# Patient Record
Sex: Male | Born: 1950
Health system: Southern US, Community
[De-identification: ages and names within clinical notes are randomized; demographics above are authoritative.]

## PROBLEM LIST (undated history)

## (undated) DIAGNOSIS — J45909 Unspecified asthma, uncomplicated: Secondary | ICD-10-CM

## (undated) DIAGNOSIS — F32A Depression, unspecified: Secondary | ICD-10-CM

## (undated) DIAGNOSIS — I219 Acute myocardial infarction, unspecified: Secondary | ICD-10-CM

## (undated) DIAGNOSIS — E669 Obesity, unspecified: Secondary | ICD-10-CM

## (undated) DIAGNOSIS — D631 Anemia in chronic kidney disease: Secondary | ICD-10-CM

## (undated) DIAGNOSIS — N189 Chronic kidney disease, unspecified: Secondary | ICD-10-CM

## (undated) DIAGNOSIS — M79606 Pain in leg, unspecified: Secondary | ICD-10-CM

## (undated) DIAGNOSIS — K5900 Constipation, unspecified: Secondary | ICD-10-CM

## (undated) DIAGNOSIS — M7989 Other specified soft tissue disorders: Secondary | ICD-10-CM

## (undated) DIAGNOSIS — R0602 Shortness of breath: Secondary | ICD-10-CM

## (undated) DIAGNOSIS — I10 Essential (primary) hypertension: Secondary | ICD-10-CM

## (undated) DIAGNOSIS — E785 Hyperlipidemia, unspecified: Secondary | ICD-10-CM

## (undated) DIAGNOSIS — U071 COVID-19: Secondary | ICD-10-CM

## (undated) DIAGNOSIS — Z86718 Personal history of other venous thrombosis and embolism: Secondary | ICD-10-CM

## (undated) DIAGNOSIS — E119 Type 2 diabetes mellitus without complications: Secondary | ICD-10-CM

## (undated) DIAGNOSIS — G4730 Sleep apnea, unspecified: Secondary | ICD-10-CM

## (undated) DIAGNOSIS — I509 Heart failure, unspecified: Secondary | ICD-10-CM

## (undated) DIAGNOSIS — I2699 Other pulmonary embolism without acute cor pulmonale: Secondary | ICD-10-CM

## (undated) DIAGNOSIS — I2510 Atherosclerotic heart disease of native coronary artery without angina pectoris: Secondary | ICD-10-CM

## (undated) DIAGNOSIS — J189 Pneumonia, unspecified organism: Secondary | ICD-10-CM

## (undated) DIAGNOSIS — I82409 Acute embolism and thrombosis of unspecified deep veins of unspecified lower extremity: Secondary | ICD-10-CM

## (undated) HISTORY — PX: TONSILLECTOMY: SUR1361

## (undated) HISTORY — DX: Other specified soft tissue disorders: M79.89

## (undated) HISTORY — DX: Anemia in chronic kidney disease: D63.1

## (undated) HISTORY — PX: COLONOSCOPY: SHX174

## (undated) HISTORY — DX: Chronic kidney disease, unspecified: N18.9

## (undated) HISTORY — DX: Essential (primary) hypertension: I10

## (undated) HISTORY — DX: Heart failure, unspecified: I50.9

## (undated) HISTORY — PX: HAND SURGERY: SHX662

## (undated) HISTORY — DX: Constipation, unspecified: K59.00

## (undated) HISTORY — DX: Type 2 diabetes mellitus without complications: E11.9

## (undated) HISTORY — DX: Obesity, unspecified: E66.9

## (undated) HISTORY — DX: Hyperlipidemia, unspecified: E78.5

## (undated) HISTORY — DX: Personal history of other venous thrombosis and embolism: Z86.718

## (undated) HISTORY — DX: Shortness of breath: R06.02

## (undated) HISTORY — DX: Sleep apnea, unspecified: G47.30

## (undated) HISTORY — DX: Pain in leg, unspecified: M79.606

## (undated) HISTORY — DX: Other pulmonary embolism without acute cor pulmonale: I26.99

---

## 1989-02-11 HISTORY — PX: HAND SURGERY: SHX662

## 2012-03-11 ENCOUNTER — Other Ambulatory Visit: Payer: Self-pay | Admitting: Orthopedic Surgery

## 2012-03-11 DIAGNOSIS — R531 Weakness: Secondary | ICD-10-CM

## 2012-03-11 DIAGNOSIS — M25562 Pain in left knee: Secondary | ICD-10-CM

## 2012-03-11 DIAGNOSIS — R609 Edema, unspecified: Secondary | ICD-10-CM

## 2013-03-01 ENCOUNTER — Encounter: Payer: Self-pay | Admitting: Gastroenterology

## 2013-03-17 ENCOUNTER — Other Ambulatory Visit: Payer: Self-pay | Admitting: Internal Medicine

## 2013-03-17 DIAGNOSIS — R799 Abnormal finding of blood chemistry, unspecified: Secondary | ICD-10-CM

## 2013-03-29 ENCOUNTER — Ambulatory Visit
Admission: RE | Admit: 2013-03-29 | Discharge: 2013-03-29 | Disposition: A | Discharge status: Home / Self Care | Payer: BC Managed Care – PPO | Source: Ambulatory Visit | Attending: Internal Medicine | Admitting: Internal Medicine

## 2013-03-29 DIAGNOSIS — R799 Abnormal finding of blood chemistry, unspecified: Secondary | ICD-10-CM

## 2013-04-05 ENCOUNTER — Other Ambulatory Visit: Payer: Self-pay

## 2013-08-26 ENCOUNTER — Encounter: Payer: Self-pay | Admitting: Gastroenterology

## 2013-10-14 ENCOUNTER — Encounter: Payer: BC Managed Care – PPO | Attending: Endocrinology

## 2013-10-14 VITALS — Ht 74.0 in | Wt 323.6 lb

## 2013-10-14 DIAGNOSIS — Z713 Dietary counseling and surveillance: Secondary | ICD-10-CM | POA: Insufficient documentation

## 2013-10-14 DIAGNOSIS — E119 Type 2 diabetes mellitus without complications: Secondary | ICD-10-CM | POA: Diagnosis not present

## 2013-10-14 NOTE — Progress Notes (Signed)
Patient was seen on 10-14-13 for the first of a series of three diabetes self-management courses at the [facility].  Patient Education Plan per assessed needs and concerns is to attend four course education program for Diabetes Self Management Education.  Current HbA1c: 11.7%  The following learning objectives were met by the patient during this class:  Describe diabetes  State some common risk factors for diabetes  Defines the role of glucose and insulin  Identifies type of diabetes and pathophysiology  Describe the relationship between diabetes and cardiovascular risk  State the members of the Healthcare Team  States the rationale for glucose monitoring  State when to test glucose  State their individual Target Range  State the importance of logging glucose readings  Describe how to interpret glucose readings  Identifies A1C target  Explain the correlation between A1c and eAG values  State symptoms and treatment of high blood glucose  State symptoms and treatment of low blood glucose  Explain proper technique for glucose testing  Identifies proper sharps disposal  Handouts given during class include:  Living Well with Diabetes book  Carb Counting and Meal Planning book  Meal Plan Card  Carbohydrate guide  Meal planning worksheet  Low Sodium Flavoring Tips  The diabetes portion plate  T9G to eAG Conversion Chart  Diabetes Medications  Diabetes Recommended Care Schedule  Support Group  Diabetes Success Plan  Core Class Satisfaction Survey  Follow-Up Plan:  Attend core 2

## 2013-10-21 DIAGNOSIS — E119 Type 2 diabetes mellitus without complications: Secondary | ICD-10-CM

## 2013-10-21 NOTE — Progress Notes (Signed)

## 2013-10-28 DIAGNOSIS — E119 Type 2 diabetes mellitus without complications: Secondary | ICD-10-CM

## 2013-11-01 NOTE — Progress Notes (Signed)
Patient was seen on 10/28/13 for the third of a series of three diabetes self-management courses at the [facility]. The following learning objectives were met by the patient during this class:    State the amount of activity recommended for healthy living   Describe activities suitable for individual needs   Identify ways to regularly incorporate activity into daily life   Identify barriers to activity and ways to over come these barriers  Identify diabetes medications being personally used and their primary action for lowering glucose and possible side effects   Describe role of stress on blood glucose and develop strategies to address psychosocial issues   Identify diabetes complications and ways to prevent them  Explain how to manage diabetes during illness   Evaluate success in meeting personal goal   Establish 2-3 goals that they will plan to diligently work on until they return for the  45-month follow-up visit  Goals:  No goals set  No obstacles noted in achieving success   Plan:  Attend Core 4 in 4 months

## 2014-02-14 ENCOUNTER — Encounter: Payer: BC Managed Care – PPO | Attending: Endocrinology

## 2014-02-14 DIAGNOSIS — E119 Type 2 diabetes mellitus without complications: Secondary | ICD-10-CM | POA: Insufficient documentation

## 2014-02-14 DIAGNOSIS — Z713 Dietary counseling and surveillance: Secondary | ICD-10-CM | POA: Insufficient documentation

## 2014-02-14 NOTE — Progress Notes (Signed)
Appt start time: 0900 end time:  1000.  Patient was seen on 02/14/14 for a review of the series of three diabetes self-management courses at the [facility]. The following learning objectives were met by the patient during this class:  . Reviewed blood glucose monitoring and interpretation including the recommended target ranges and Hgb A1c.  . Reviewed on carb counting, importance of regularly scheduled meals/snacks, and meal planning.  . Reviewed the effects of physical activity on glucose levels and long-term glucose control.  Recommended goal of 150 minutes of physical activity/week. . Reviewed patient medications and discussed role of medication on blood glucose and possible side effects. . Discussed strategies to manage stress, psychosocial issues, and other obstacles to diabetes management. . Encouraged moderate weight reduction to improve glucose levels.   . Reviewed short-term complications: hyper- and hypo-glycemia.  Discussed causes, symptoms, and treatment options. . Reviewed prevention, detection, and treatment of long-term complications.  Discussed the role of prolonged elevated glucose levels on body systems.  Goals:  Follow Diabetes Meal Plan as instructed  Eat 3 meals and 2 snacks, every 3-5 hrs  Limit carbohydrate intake to 45 grams carbohydrate/meal Limit carbohydrate intake to 15 grams carbohydrate/snack Add lean protein foods to meals/snacks  Monitor glucose levels as instructed by your doctor  Aim for goal of 15-30 mins of physical activity daily as tolerated  Bring food record and glucose log to your next nutrition visit

## 2016-02-12 DIAGNOSIS — J189 Pneumonia, unspecified organism: Secondary | ICD-10-CM

## 2016-02-12 HISTORY — DX: Pneumonia, unspecified organism: J18.9

## 2016-09-04 ENCOUNTER — Other Ambulatory Visit: Payer: Self-pay | Admitting: *Deleted

## 2016-09-04 NOTE — Patient Outreach (Signed)
[facility]) Care Management  09/04/2016  Aston Lieske [facility] 07-20-1950 [id number]   Member identified as high risk according to [employer] health questionnaire.  Call placed to introduce [facility] care management services and perform telephone screening.  No answer, HIPAA compliant voice message left.  Will await call back, if no call back, will follow up within the next week.  Call received back from member.  Identity confirmed.  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  He confirms that he does visit his primary MD, Dr. Wilson Singer, on a regular basis.  He denies any visits to the ED or admits to the hospital.  He has history of hypertension and diabetes, but report that they are both well controlled.  He denies taking any medications at this time due to conditions being controlled.  He is independent with ADLs/IADLs, denies the need for assistance in the home.  He denies the need for [facility] care management services at this time, is receptive to receiving information in the event there is a need in the future.  Will send [facility] information packet, will not open case/place referral.   Valente David, RN, MSN [place] [phone]

## 2016-09-10 ENCOUNTER — Encounter: Payer: Self-pay | Admitting: *Deleted

## 2016-10-08 DIAGNOSIS — I10 Essential (primary) hypertension: Secondary | ICD-10-CM | POA: Diagnosis not present

## 2016-10-08 DIAGNOSIS — Z0000 Encounter for general adult medical examination without abnormal findings: Secondary | ICD-10-CM | POA: Diagnosis not present

## 2016-10-08 DIAGNOSIS — Z23 Encounter for immunization: Secondary | ICD-10-CM | POA: Diagnosis not present

## 2016-10-08 DIAGNOSIS — E119 Type 2 diabetes mellitus without complications: Secondary | ICD-10-CM | POA: Diagnosis not present

## 2016-11-04 DIAGNOSIS — I10 Essential (primary) hypertension: Secondary | ICD-10-CM | POA: Diagnosis not present

## 2016-11-04 DIAGNOSIS — E1165 Type 2 diabetes mellitus with hyperglycemia: Secondary | ICD-10-CM | POA: Diagnosis not present

## 2016-11-04 DIAGNOSIS — Z0000 Encounter for general adult medical examination without abnormal findings: Secondary | ICD-10-CM | POA: Diagnosis not present

## 2016-11-04 DIAGNOSIS — E789 Disorder of lipoprotein metabolism, unspecified: Secondary | ICD-10-CM | POA: Diagnosis not present

## 2016-11-04 DIAGNOSIS — Z7289 Other problems related to lifestyle: Secondary | ICD-10-CM | POA: Diagnosis not present

## 2016-11-04 DIAGNOSIS — Z125 Encounter for screening for malignant neoplasm of prostate: Secondary | ICD-10-CM | POA: Diagnosis not present

## 2016-11-06 DIAGNOSIS — Z23 Encounter for immunization: Secondary | ICD-10-CM | POA: Diagnosis not present

## 2016-11-06 DIAGNOSIS — E7800 Pure hypercholesterolemia, unspecified: Secondary | ICD-10-CM | POA: Diagnosis not present

## 2016-11-06 DIAGNOSIS — E1165 Type 2 diabetes mellitus with hyperglycemia: Secondary | ICD-10-CM | POA: Diagnosis not present

## 2016-11-06 DIAGNOSIS — R809 Proteinuria, unspecified: Secondary | ICD-10-CM | POA: Diagnosis not present

## 2016-11-06 DIAGNOSIS — G4733 Obstructive sleep apnea (adult) (pediatric): Secondary | ICD-10-CM | POA: Diagnosis not present

## 2016-11-06 DIAGNOSIS — M5432 Sciatica, left side: Secondary | ICD-10-CM | POA: Diagnosis not present

## 2016-11-06 DIAGNOSIS — Z0001 Encounter for general adult medical examination with abnormal findings: Secondary | ICD-10-CM | POA: Diagnosis not present

## 2016-11-06 DIAGNOSIS — J4521 Mild intermittent asthma with (acute) exacerbation: Secondary | ICD-10-CM | POA: Diagnosis not present

## 2016-11-06 DIAGNOSIS — I10 Essential (primary) hypertension: Secondary | ICD-10-CM | POA: Diagnosis not present

## 2016-11-06 DIAGNOSIS — E789 Disorder of lipoprotein metabolism, unspecified: Secondary | ICD-10-CM | POA: Diagnosis not present

## 2016-11-06 DIAGNOSIS — J90 Pleural effusion, not elsewhere classified: Secondary | ICD-10-CM | POA: Diagnosis not present

## 2016-11-06 DIAGNOSIS — R0602 Shortness of breath: Secondary | ICD-10-CM | POA: Diagnosis not present

## 2016-11-14 DIAGNOSIS — R9431 Abnormal electrocardiogram [ECG] [EKG]: Secondary | ICD-10-CM | POA: Diagnosis not present

## 2016-11-14 DIAGNOSIS — E1165 Type 2 diabetes mellitus with hyperglycemia: Secondary | ICD-10-CM | POA: Diagnosis not present

## 2016-11-14 DIAGNOSIS — I10 Essential (primary) hypertension: Secondary | ICD-10-CM | POA: Diagnosis not present

## 2016-11-14 DIAGNOSIS — R0609 Other forms of dyspnea: Secondary | ICD-10-CM | POA: Diagnosis not present

## 2016-11-16 ENCOUNTER — Encounter (HOSPITAL_COMMUNITY): Payer: Self-pay | Admitting: Emergency Medicine

## 2016-11-16 ENCOUNTER — Observation Stay (HOSPITAL_COMMUNITY)
Admission: EM | Admit: 2016-11-16 | Discharge: 2016-11-17 | Disposition: A | Discharge status: Home / Self Care | Payer: PPO | Attending: Family Medicine | Admitting: Family Medicine

## 2016-11-16 DIAGNOSIS — Z79899 Other long term (current) drug therapy: Secondary | ICD-10-CM | POA: Diagnosis not present

## 2016-11-16 DIAGNOSIS — Z7984 Long term (current) use of oral hypoglycemic drugs: Secondary | ICD-10-CM | POA: Insufficient documentation

## 2016-11-16 DIAGNOSIS — R42 Dizziness and giddiness: Secondary | ICD-10-CM | POA: Insufficient documentation

## 2016-11-16 DIAGNOSIS — E785 Hyperlipidemia, unspecified: Secondary | ICD-10-CM | POA: Diagnosis not present

## 2016-11-16 DIAGNOSIS — Z6837 Body mass index (BMI) 37.0-37.9, adult: Secondary | ICD-10-CM | POA: Diagnosis not present

## 2016-11-16 DIAGNOSIS — E1165 Type 2 diabetes mellitus with hyperglycemia: Secondary | ICD-10-CM | POA: Insufficient documentation

## 2016-11-16 DIAGNOSIS — E1169 Type 2 diabetes mellitus with other specified complication: Secondary | ICD-10-CM | POA: Diagnosis not present

## 2016-11-16 DIAGNOSIS — E1122 Type 2 diabetes mellitus with diabetic chronic kidney disease: Secondary | ICD-10-CM | POA: Diagnosis not present

## 2016-11-16 DIAGNOSIS — I517 Cardiomegaly: Secondary | ICD-10-CM

## 2016-11-16 DIAGNOSIS — E871 Hypo-osmolality and hyponatremia: Secondary | ICD-10-CM | POA: Diagnosis not present

## 2016-11-16 DIAGNOSIS — R600 Localized edema: Secondary | ICD-10-CM | POA: Insufficient documentation

## 2016-11-16 DIAGNOSIS — W010XXA Fall on same level from slipping, tripping and stumbling without subsequent striking against object, initial encounter: Secondary | ICD-10-CM | POA: Diagnosis not present

## 2016-11-16 DIAGNOSIS — Z7982 Long term (current) use of aspirin: Secondary | ICD-10-CM | POA: Diagnosis not present

## 2016-11-16 DIAGNOSIS — N183 Chronic kidney disease, stage 3 (moderate): Secondary | ICD-10-CM | POA: Diagnosis not present

## 2016-11-16 DIAGNOSIS — Z8249 Family history of ischemic heart disease and other diseases of the circulatory system: Secondary | ICD-10-CM | POA: Diagnosis not present

## 2016-11-16 DIAGNOSIS — N179 Acute kidney failure, unspecified: Secondary | ICD-10-CM | POA: Diagnosis not present

## 2016-11-16 DIAGNOSIS — E669 Obesity, unspecified: Secondary | ICD-10-CM | POA: Diagnosis not present

## 2016-11-16 DIAGNOSIS — I129 Hypertensive chronic kidney disease with stage 1 through stage 4 chronic kidney disease, or unspecified chronic kidney disease: Secondary | ICD-10-CM | POA: Diagnosis not present

## 2016-11-16 DIAGNOSIS — S098XXA Other specified injuries of head, initial encounter: Secondary | ICD-10-CM | POA: Diagnosis not present

## 2016-11-16 DIAGNOSIS — R55 Syncope and collapse: Secondary | ICD-10-CM | POA: Diagnosis not present

## 2016-11-16 DIAGNOSIS — I082 Rheumatic disorders of both aortic and tricuspid valves: Secondary | ICD-10-CM | POA: Diagnosis not present

## 2016-11-16 DIAGNOSIS — G4730 Sleep apnea, unspecified: Secondary | ICD-10-CM | POA: Diagnosis not present

## 2016-11-16 DIAGNOSIS — I2510 Atherosclerotic heart disease of native coronary artery without angina pectoris: Secondary | ICD-10-CM | POA: Insufficient documentation

## 2016-11-16 DIAGNOSIS — I951 Orthostatic hypotension: Secondary | ICD-10-CM | POA: Diagnosis not present

## 2016-11-16 DIAGNOSIS — Z809 Family history of malignant neoplasm, unspecified: Secondary | ICD-10-CM | POA: Insufficient documentation

## 2016-11-16 DIAGNOSIS — I493 Ventricular premature depolarization: Secondary | ICD-10-CM | POA: Insufficient documentation

## 2016-11-16 DIAGNOSIS — S0001XA Abrasion of scalp, initial encounter: Secondary | ICD-10-CM | POA: Insufficient documentation

## 2016-11-16 DIAGNOSIS — S0181XA Laceration without foreign body of other part of head, initial encounter: Secondary | ICD-10-CM | POA: Diagnosis not present

## 2016-11-16 LAB — URINALYSIS, ROUTINE W REFLEX MICROSCOPIC
Bacteria, UA: NONE SEEN
Bilirubin Urine: NEGATIVE
HGB URINE DIPSTICK: NEGATIVE
Ketones, ur: NEGATIVE mg/dL
LEUKOCYTES UA: NEGATIVE
NITRITE: NEGATIVE
PH: 5 (ref 5.0–8.0)
Protein, ur: 100 mg/dL — AB
RBC / HPF: NONE SEEN RBC/hpf (ref 0–5)
SQUAMOUS EPITHELIAL / LPF: NONE SEEN
Specific Gravity, Urine: 1.018 (ref 1.005–1.030)
WBC, UA: NONE SEEN WBC/hpf (ref 0–5)

## 2016-11-16 LAB — CBC
HCT: 41.9 % (ref 39.0–52.0)
Hemoglobin: 14.8 g/dL (ref 13.0–17.0)
MCH: 31.7 pg (ref 26.0–34.0)
MCHC: 35.3 g/dL (ref 30.0–36.0)
MCV: 89.7 fL (ref 78.0–100.0)
PLATELETS: 204 10*3/uL (ref 150–400)
RBC: 4.67 MIL/uL (ref 4.22–5.81)
RDW: 13.2 % (ref 11.5–15.5)
WBC: 7.5 10*3/uL (ref 4.0–10.5)

## 2016-11-16 LAB — CBG MONITORING, ED: GLUCOSE-CAPILLARY: 226 mg/dL — AB (ref 65–99)

## 2016-11-16 LAB — BASIC METABOLIC PANEL
Anion gap: 10 (ref 5–15)
BUN: 53 mg/dL — AB (ref 6–20)
CALCIUM: 8.9 mg/dL (ref 8.9–10.3)
CHLORIDE: 101 mmol/L (ref 101–111)
CO2: 20 mmol/L — ABNORMAL LOW (ref 22–32)
CREATININE: 1.93 mg/dL — AB (ref 0.61–1.24)
GFR, EST AFRICAN AMERICAN: 40 mL/min — AB (ref >60)
GFR, EST NON AFRICAN AMERICAN: 35 mL/min — AB (ref >60)
Glucose, Bld: 229 mg/dL — ABNORMAL HIGH (ref 65–99)
Potassium: 4.7 mmol/L (ref 3.5–5.1)
SODIUM: 131 mmol/L — AB (ref 135–145)

## 2016-11-16 LAB — GLUCOSE, CAPILLARY: GLUCOSE-CAPILLARY: 255 mg/dL — AB (ref 65–99)

## 2016-11-16 MED ORDER — SODIUM CHLORIDE 0.9 % IV BOLUS (SEPSIS)
1000.0000 mL | Freq: Once | INTRAVENOUS | Status: AC
  Administered 2016-11-16: 1000 mL via INTRAVENOUS

## 2016-11-16 MED ORDER — ACETAMINOPHEN 650 MG RE SUPP: 650.0000 mg | Freq: Four times a day (QID) | RECTAL | Status: DC | PRN

## 2016-11-16 MED ORDER — INSULIN ASPART 100 UNIT/ML ~~LOC~~ SOLN
0.0000 [IU] | Freq: Three times a day (TID) | SUBCUTANEOUS | Status: DC
Start: 2016-11-17 — End: 2016-11-17
  Administered 2016-11-17 (×2): 3 [IU] via SUBCUTANEOUS

## 2016-11-16 MED ORDER — SODIUM CHLORIDE 0.9 % IV BOLUS (SEPSIS)
500.0000 mL | Freq: Once | INTRAVENOUS | Status: AC
  Administered 2016-11-16: 500 mL via INTRAVENOUS

## 2016-11-16 MED ORDER — SODIUM CHLORIDE 0.9% FLUSH
3.0000 mL | Freq: Two times a day (BID) | INTRAVENOUS | Status: DC
  Administered 2016-11-16 – 2016-11-17 (×2): 3 mL via INTRAVENOUS

## 2016-11-16 MED ORDER — SODIUM CHLORIDE 0.9 % IV SOLN
INTRAVENOUS | Status: DC
  Administered 2016-11-16 – 2016-11-17 (×2): via INTRAVENOUS

## 2016-11-16 MED ORDER — ENOXAPARIN SODIUM 40 MG/0.4ML ~~LOC~~ SOLN
40.0000 mg | SUBCUTANEOUS | Status: DC
  Administered 2016-11-16: 40 mg via SUBCUTANEOUS
  Filled 2016-11-16: qty 0.4

## 2016-11-16 MED ORDER — ACETAMINOPHEN 325 MG PO TABS: 650.0000 mg | ORAL_TABLET | Freq: Four times a day (QID) | ORAL | Status: DC | PRN

## 2016-11-16 MED ORDER — ASPIRIN EC 81 MG PO TBEC
81.0000 mg | DELAYED_RELEASE_TABLET | Freq: Every day | ORAL | Status: DC
  Administered 2016-11-17: 81 mg via ORAL
  Filled 2016-11-16: qty 1

## 2016-11-16 MED ORDER — INSULIN ASPART 100 UNIT/ML ~~LOC~~ SOLN
0.0000 [IU] | Freq: Every day | SUBCUTANEOUS | Status: DC
  Administered 2016-11-16: 3 [IU] via SUBCUTANEOUS

## 2016-11-16 MED ORDER — ROSUVASTATIN CALCIUM 20 MG PO TABS
20.0000 mg | ORAL_TABLET | Freq: Every day | ORAL | Status: DC
  Administered 2016-11-17: 20 mg via ORAL
  Filled 2016-11-16: qty 1

## 2016-11-16 MED ORDER — SODIUM CHLORIDE 0.9 % IV BOLUS (SEPSIS)
1000.0000 mL | Freq: Once | INTRAVENOUS | Status: DC
Start: 2016-11-16 — End: 2016-11-16

## 2016-11-16 NOTE — ED Provider Notes (Signed)
[facility] DEPT Provider Note   CSN: [id number] Arrival date & time: 11/16/16  1509     History   Chief Complaint Chief Complaint  Patient presents with  . Near Syncope    HPI  Travis Blair is a 66 y.o. male with a history of CAD, hypertension, hyperlipidemia, diabetes, who presents today after a syncopal episode. Patient reports he was sitting on the bleachers at a baseball game, he not had much to eat or drink today started to feel hot and dizzy after standing up quickly and then fell to the ground. He denies any chest pain, shortness of breath, palpitations with this episode Patient reports he recently started seeing Dr. Latanya Maudlin  with cardiology, who thinks he may have had an MI recently and was started on several new medications this week including metoprolol and lisinopril which he took for the first time yesterday. He reports yesterday after taking medications for the first time he had some dizziness, but no episodes of syncope. Patient reports he did hit the back of his head, on the concrete, he reports small abrasion to the back of the head no other injuries from fall, denies headache, vision changes, nausea or vomiting.       Past Medical History:  Diagnosis Date  . Diabetes mellitus without complication ([place])   . Hyperlipidemia   . Hypertension   . Obesity   . Sleep apnea     There are no active problems to display for this patient.   Past Surgical History:  Procedure Laterality Date  . HAND SURGERY         Home Medications    Prior to Admission medications   Medication Sig Start Date End Date Taking? Authorizing Provider  amLODipine (NORVASC) 10 MG tablet Take 10 mg by mouth daily.    [provider]  aspirin EC 81 MG tablet Take 81 mg by mouth daily.    [provider]  fenofibrate (TRICOR) 145 MG tablet Take 145 mg by mouth daily.    [provider]  Liraglutide 18 MG/3ML SOPN Inject 1.8 mg/day into the skin daily.     [provider]  metFORMIN (GLUCOPHAGE) 500 MG tablet Take 500 mg by mouth 1 day or 1 dose.    [provider]  pioglitazone (ACTOS) 45 MG tablet Take 45 mg by mouth daily.    [provider]  rosuvastatin (CRESTOR) 20 MG tablet Take 20 mg by mouth daily.    [provider]  sertraline (ZOLOFT) 100 MG tablet Take 100 mg by mouth daily.    [provider]  telmisartan (MICARDIS) 80 MG tablet Take 80 mg by mouth daily.    [provider]    Family History Family History  Problem Relation Age of Onset  . Cancer Other   . Hypertension Other   . Hyperlipidemia Other   . Heart disease Other     Social History Social History  Substance Use Topics  . Smoking status: Never Smoker  . Smokeless tobacco: Never Used  . Alcohol use No     Allergies   Patient has no known allergies.   Review of Systems Review of Systems  Constitutional: Negative for chills and fever.  HENT: Negative for congestion, rhinorrhea and sore throat.   Eyes: Negative for visual disturbance.  Respiratory: Negative for cough, chest tightness and shortness of breath.   Cardiovascular: Negative for chest pain and palpitations.  Gastrointestinal: Negative for abdominal pain, nausea and vomiting.  Genitourinary:  Negative for dysuria.  Musculoskeletal: Negative for arthralgias and joint swelling.  Skin: Positive for wound (scalp abrasion).  Neurological: Positive for syncope, weakness and light-headedness. Negative for facial asymmetry, speech difficulty and numbness.     Physical Exam Updated Vital Signs BP (!) 90/58   Pulse 66   Temp 98.4 F (36.9 C) (Oral)   Resp 20   Ht 6\' 2"  (1.88 m)   Wt 134.7 kg (297 lb)   SpO2 99%   BMI 38.13 kg/m   Physical Exam  Constitutional: He appears well-developed and well-nourished. No distress.  HENT:  Head: Normocephalic.  Small abrasion to back of scalp, no hematoma  Eyes: Pupils are equal, round, and reactive  to light. EOM are normal. Right eye exhibits no discharge. Left eye exhibits no discharge.  Neck: Normal range of motion. Neck supple.  Cardiovascular: Normal rate, regular rhythm, normal heart sounds and intact distal pulses.   Pulmonary/Chest: Effort normal and breath sounds normal. No respiratory distress. He has no wheezes. He has no rales.  Abdominal: Soft. Bowel sounds are normal. He exhibits no distension. There is no tenderness. There is no guarding.  Musculoskeletal: He exhibits no edema or deformity.  All joints supple, easily movable, no deformity palpated. All compartments soft  Neurological: He is alert. Coordination normal.  Speech is clear, able to follow commands CN III-XII intact Normal strength in upper and lower extremities bilaterally including dorsiflexion and plantar flexion, strong and equal grip strength Sensation normal to light and sharp touch Moves extremities without ataxia, coordination intact Normal finger to nose and rapid alternating movements No pronator drift  Skin: Skin is warm and dry. Capillary refill takes less than 2 seconds. He is not diaphoretic.  Psychiatric: He has a normal mood and affect. His behavior is normal.  Nursing note and vitals reviewed.    ED Treatments / Results  Labs (all labs ordered are listed, but only abnormal results are displayed) Labs Reviewed  BASIC METABOLIC PANEL - Abnormal; Notable for the following:       Result Value   Sodium 131 (*)    CO2 20 (*)    Glucose, Bld 229 (*)    BUN 53 (*)    Creatinine, Ser 1.93 (*)    GFR calc non Af Amer 35 (*)    GFR calc Af Amer 40 (*)    All other components within normal limits  URINALYSIS, ROUTINE W REFLEX MICROSCOPIC - Abnormal; Notable for the following:    Glucose, UA >=500 (*)    Protein, ur 100 (*)    All other components within normal limits  CBG MONITORING, ED - Abnormal; Notable for the following:    Glucose-Capillary 226 (*)    All other components within normal  limits  CBC    EKG  EKG Interpretation  Date/Time:  Saturday November 16 2016 15:10:04 EDT Ventricular Rate:  69 PR Interval:    QRS Duration: 121 QT Interval:  419 QTC Calculation: 449 R Axis:   20 Text Interpretation:  Sinus rhythm Ventricular premature complex Nonspecific intraventricular conduction delay Inferior infarct, old NO STEMI No old tracing to compare Confirmed by Addison Lank [phone]) on 11/16/2016 3:14:33 PM       Radiology No results found.  Procedures Procedures (including critical care time)  Medications Ordered in ED Medications  sodium chloride 0.9 % bolus 500 mL (0 mLs Intravenous Stopped 11/16/16 1712)  sodium chloride 0.9 % bolus 1,000 mL (0 mLs Intravenous Stopped 11/16/16 1943)  Initial Impression / Assessment and Plan / ED Course  I have reviewed the triage vital signs and the nursing notes.  Pertinent labs & imaging results that were available during my care of the patient were reviewed by me and considered in my medical decision making (see chart for details).  Patient presents with near syncopal event, It'll show orthostatic hypotension, otherwise normal. Patient did hit his head when he fell down today, small abrasion to the back of the scalp, patient takes baby aspirin, no other blood thinners or antiplatelets, no hematoma and no deficits on neurologic exam, no head CT necessary at this time. No evidence of any other injuries from fall. Patient with orthostatic hypotension, likely due to addition of new blood pressure medications and poor oral intake. EKG does show Q waves and a conduction delay. Labs significant for elevated creatinine, no previous value for comparison. Mild hyponatremia, labs otherwise unremarkable. Fluids provided in ED  Patient will need to be admitted for a duration in the setting of AKI and near syncope. Hospitalist consultation and will see and admit the patient, cardiology consultation for management of blood pressure  medications.  Patient discussed with Dr. Dayna Barker, who saw patient as well and agrees with plan.  Final Clinical Impressions(s) / ED Diagnoses   Final diagnoses:  Near syncope  Orthostatic hypotension  Abrasion of scalp, initial encounter    New Prescriptions New Prescriptions   No medications on file     Janet Berlin 11/16/16 2037    Merrily Pew, MD 11/16/16 4469    Merrily Pew, MD 11/16/16 2349

## 2016-11-16 NOTE — Consult Note (Signed)
Cardiology Consultation:   Patient ID: [name]; [id number]; [birth year]   Admit date: 11/16/2016 Date of Consult: 11/16/2016  Primary Care Provider: Anda Kraft, MD Primary Cardiologist: Dr. Sherrie Blair  Patient Profile:   Travis Blair is a 66 y.o. male with a hx of HTN, HL, DM, presumed CAD who is being seen today for the evaluation of near syncope at the request of Dr. Leonette Blair.  History of Present Illness:   Travis Blair is a 74yoM w/ hx of HTN, HL, DM who was recently seen by Dr. Sherrie Blair on 11/14/16 for new Q waves on ECG who presents to the ED after a near syncopal event.  Pt reports that Dr. Sherrie Blair started him on 4 new medications at his visit on Thursday (omega 3s, metoprolol 25 XL daily, rosuva 20mg  daily, lisinopril 10mg  daily) and he took them for the first time yesterday.  He said he felt woozy and lightheaded but had no LOC yesterday.  Today, he was at a baseball game and when he stood up he lost his balance and fell back hitting his head on the concrete.  He denies LOC, palpitations, CP, or SOB at the time.  States that he tried to recover his balance, but fell over several more times prompting onlookers to call 911 and bring him to the ED.  In the ED he was given IV fluids and has not had any recurrence of his symptoms. He states that he has walked around the room and has no dizziness or lightheadedness.  He states this has never happened to him before.  Dr. Army Fossa records are not viewable currently in the chart.  Patient states that Dr. Sherrie Blair believes he had an MI in the recent few weeks (presumably due to the inferior Q waves on his ECG).  Patient states that he has never had an episode of CP, SOB and does not recall anything that could have been an MI.  States he had pneumonia in the spring but that is his only recent illness.  He states that he stopped all his DM medications (and other) approximately 1 year ago because of unwanted weight gain and subsequently lost  weight.  When he went to his internest a few weeks ago, they told him that his DM is not well controlled and referred him to Dr. Sherrie Blair after seeing the Q waves on his ECG.  He has a stress test and echo ordered but it has not yet been done.  Past Medical History:  Diagnosis Date  . Diabetes mellitus without complication ([place])   . Hyperlipidemia   . Hypertension   . Obesity   . Sleep apnea     Past Surgical History:  Procedure Laterality Date  . HAND SURGERY       Home Medications:  Omega 3 fatty acids Metoprolol 25 XL daily Rosuvastatin 20 daily Lisinopril 10mg  daily  Inpatient Medications: Scheduled Meds:  Continuous Infusions:  PRN Meds:   Allergies:   No Known Allergies  Social History:   Social History   Social History  . Marital status: Unknown    Spouse name: N/A  . Number of children: N/A  . Years of education: N/A   Occupational History  . Not on file.   Social History Main Topics  . Smoking status: Never Smoker  . Smokeless tobacco: Never Used  . Alcohol use No  . Drug use: No  . Sexual activity: Not on file   Other Topics Concern  . Not on file  Social History Narrative  . No narrative on file    Family History:    Family History  Problem Relation Age of Onset  . Cancer Other   . Hypertension Other   . Hyperlipidemia Other   . Heart disease Other      ROS:  Please see the history of present illness.  Review of Systems  Constitution: Negative for chills, fever and night sweats.  Cardiovascular: Negative for chest pain and dyspnea on exertion.  Respiratory: Negative for shortness of breath.   Gastrointestinal: Negative for constipation and diarrhea.  Neurological: Positive for light-headedness.  All other systems reviewed and are negative.    Physical Exam/Data:   Vitals:   11/16/16 1630 11/16/16 1645 11/16/16 1700 11/16/16 1730  BP: 124/69 120/67 121/71 123/67  Pulse: 63 63 65 66  Resp: 12 17 (!) 21 18  Temp:        TempSrc:      SpO2: 99% 98% 99% 98%  Weight:      Height:        Intake/Output Summary (Last 24 hours) at 11/16/16 1813 Last data filed at 11/16/16 1712  Gross per 24 hour  Intake              500 ml  Output                0 ml  Net              500 ml   Filed Weights   11/16/16 1514  Weight: 134.7 kg (297 lb)   Body mass index is 38.13 kg/m.  General:  Well nourished, well developed, in no acute distress HEENT: normal Lymph: no adenopathy Neck: no JVD Endocrine:  No thryomegaly Vascular: No carotid bruits; FA pulses 2+ bilaterally without bruits  Cardiac:  normal S1, S2; RRR; no murmur  Lungs:  clear to auscultation bilaterally, no wheezing, rhonchi or rales  Abd: soft, nontender, no hepatomegaly  Ext: no edema Musculoskeletal:  No deformities, BUE and BLE strength normal and equal Skin: warm and dry  Neuro:  CNs 2-12 intact, no focal abnormalities noted Psych:  Normal affect   EKG:  The EKG was personally reviewed and demonstrates:  NSR w/ a PVC, inferior Q waves, poor R wave progression   Relevant CV Studies: none  Laboratory Data:  Chemistry Recent Labs Lab 11/16/16 1523  NA 131*  K 4.7  CL 101  CO2 20*  GLUCOSE 229*  BUN 53*  CREATININE 1.93*  CALCIUM 8.9  GFRNONAA 35*  GFRAA 40*  ANIONGAP 10    No results for input(s): PROT, ALBUMIN, AST, ALT, ALKPHOS, BILITOT in the last 168 hours. Hematology Recent Labs Lab 11/16/16 1523  WBC 7.5  RBC 4.67  HGB 14.8  HCT 41.9  MCV 89.7  MCH 31.7  MCHC 35.3  RDW 13.2  PLT 204   Cardiac EnzymesNo results for input(s): TROPONINI in the last 168 hours. No results for input(s): TROPIPOC in the last 168 hours.  BNPNo results for input(s): BNP, PROBNP in the last 168 hours.  DDimer No results for input(s): DDIMER in the last 168 hours.  Radiology/Studies:  No results found.  Assessment and Plan:   1. Orthostatic hypotension: pt reports that he was weak and dizzy upon standing in the setting of  recently starting moderate doses of BB and ACEi after not being on meds for 1 year.  He also reports that he did not take good PO today.  I suspect  that his fall was related to orthostasis in the setting of hypovolemia and the new medication increase. He denies any palpitations or chest discomfort and had no true LOC, so cardiac syncope is unlikely.  Suggest that his antihypertensives be held tonight, he receive volume repletion and repeat his labs in the am. 1. Hold metoprolol and lisniopril tonight, start metoprolol in am if BP tolerates, hold lisinopril until Cr improves (likely restart as an outpatient) 2. Monitor on telemetry overnight given unclear cardiac history and possible recent MI 3. Continue plan for stress test and echo as an outpatient 4. Consider imaging head given hit on concrete   For questions or updates, please contact [place] Please consult [email or web address] for contact info under Cardiology/STEMI.   Signed, Lujean Amel, MD  11/16/2016 6:13 PM

## 2016-11-16 NOTE — ED Provider Notes (Signed)
Medical screening examination/treatment/procedure(s) were conducted as a shared visit with non-physician practitioner(s) and myself.  I personally evaluated the patient during the encounter.  66 year old male with history of diabetes who presents to the emergency department todaysecondary to near-syncopal episode. Patient states he recently started 2 new blood pressure medications even though he had normal blood pressures in the office and was told to stop eating and drinking so much He said decreased by mouth intake because of that over the last couple days today he was outside and felt really hotand then also and got real weak. [place] down a couple times did hit his head but did not actually syncopized. Brought here for further evaluation here is also signs are okay as neurologic examination is intact on my exam. His EKG shows that he has intraventricular conduction delay and some Q waves with no old EKG to compare to. Also has a creatinine of 1.9 consistent with likely dehydrationand no previous to compare to. Suspect likely dehydration as cause for his weakness however has risk factors with the Q waves and conduction delay on his EKG. Will plan for cardiology consultation for blood pressure medication management and admission to medicine for hydration and monitoring.   EKG Interpretation  Date/Time:  Saturday November 16 2016 15:10:04 EDT Ventricular Rate:  69 PR Interval:    QRS Duration: 121 QT Interval:  419 QTC Calculation: 449 R Axis:   20 Text Interpretation:  Sinus rhythm Ventricular premature complex Nonspecific intraventricular conduction delay Inferior infarct, old NO STEMI No old tracing to compare Confirmed by Addison Lank [phone]) on 11/16/2016 3:14:33 PM         Travis Blair, Corene Cornea, MD 11/16/16 2349

## 2016-11-16 NOTE — ED Triage Notes (Signed)
Pt BIB EMS for syncopal episode. Pt reports sitting on the bleachers at a baseball game, and when he stood up he felt hot and dizzy and fell; states "I have barely ate or drank anything today." Pt denies CP or LOC. Abrasion noted to the back of head. Pt reports seeing his cardiologist last week and he believes that he might have had a recent MI; pt reports them switching his medications, but is unsure of the meds at this time. States his wife is bringing his med list. Pt denies blood thinners. Resp e/u; A&Ox4.

## 2016-11-16 NOTE — H&P (Signed)
History and Physical    Travis Blair [facility] PFX:[id number] DOB: [birth year] DOA: 11/16/2016  PCP: Anda Kraft, MD Patient coming from: Home  Chief Complaint: Near-syncope  HPI: Travis Blair is a 66 y.o. male with medical history significant of obesity, hyperlipidemia, diabetes mellitus, type 2. He presents today after having a near-syncopal episode. He was at a softball game and attempted to rise when he felt dizzy and lightheaded, falling down the bleachers. He did not lose consciousness but he did hit the back of his head. EMS was called for evaluation at that point. Nothing was tried to improve his dizziness. Symptoms improved by the time EMS arrived.  ED Course: Vitals: Afebrile, normotensive, on room air Labs: Creatinine of 1.93, CO2 of 20 Imaging: None Medications/Course: 1.5L NS  Review of Systems: Review of Systems  Constitutional: Negative for chills and fever.  Cardiovascular: Positive for leg swelling (left, chronic). Negative for chest pain and palpitations.  Gastrointestinal: Negative for abdominal pain, constipation, diarrhea, nausea and vomiting.  Neurological: Positive for dizziness. Negative for loss of consciousness.  All other systems reviewed and are negative.   Past Medical History:  Diagnosis Date  . Diabetes mellitus without complication ([place])   . Hyperlipidemia   . Hypertension   . Obesity   . Sleep apnea     Past Surgical History:  Procedure Laterality Date  . HAND SURGERY       reports that he has never smoked. He has never used smokeless tobacco. He reports that he does not drink alcohol or use drugs.  No Known Allergies  Family History  Problem Relation Age of Onset  . Cancer Other   . Hypertension Other   . Hyperlipidemia Other   . Heart disease Other    Prior to Admission medications   Medication Sig Start Date End Date Taking? Authorizing Provider  amLODipine (NORVASC) 10 MG tablet Take 10 mg by mouth daily.    [provider]  aspirin EC 81 MG tablet Take 81 mg by mouth daily.    [provider]  fenofibrate (TRICOR) 145 MG tablet Take 145 mg by mouth daily.    [provider]  Liraglutide 18 MG/3ML SOPN Inject 1.8 mg/day into the skin daily.    [provider]  metFORMIN (GLUCOPHAGE) 500 MG tablet Take 500 mg by mouth 1 day or 1 dose.    [provider]  pioglitazone (ACTOS) 45 MG tablet Take 45 mg by mouth daily.    [provider]  rosuvastatin (CRESTOR) 20 MG tablet Take 20 mg by mouth daily.    [provider]  sertraline (ZOLOFT) 100 MG tablet Take 100 mg by mouth daily.    [provider]  telmisartan (MICARDIS) 80 MG tablet Take 80 mg by mouth daily.    [provider]    Physical Exam: Vitals:   11/16/16 1615 11/16/16 1630 11/16/16 1645 11/16/16 1700  BP: 128/69 124/69 120/67 121/71  Pulse: 65 63 63 65  Resp: 20 12 17  (!) 21  Temp:      TempSrc:      SpO2: 99% 99% 98% 99%  Weight:      Height:         Constitutional: NAD, calm, comfortable Eyes: PERRL, lids and conjunctivae normal ENMT: Mucous membranes are moist. Posterior pharynx clear of any exudate or lesions. Normal dentition.  Neck: normal, supple, no masses, no thyromegaly Respiratory: clear to auscultation bilaterally, no wheezing, no crackles. Normal respiratory effort. No accessory  muscle use.  Cardiovascular: Regular rate and rhythm, no murmurs / rubs / gallops. Left sided ankle edema. 1+ pedal pulses. No carotid bruits.  Abdomen: no tenderness, no masses palpated. No hepatosplenomegaly. Bowel sounds positive.  Musculoskeletal: no clubbing / cyanosis. No joint deformity upper and lower extremities. Good ROM, no contractures. Normal muscle tone.  Skin: no rashes, lesions, ulcers. No induration Neurologic: CN 2-12 grossly intact. Sensation intact, DTR normal. Strength 5/5 in all 4.  Psychiatric: Normal judgment and insight. Alert and oriented x 3.  Normal mood.    Labs on Admission: I have personally reviewed following labs and imaging studies  CBC:  Recent Labs Lab 11/16/16 1523  WBC 7.5  HGB 14.8  HCT 41.9  MCV 89.7  PLT 950   Basic Metabolic Panel:  Recent Labs Lab 11/16/16 1523  NA 131*  K 4.7  CL 101  CO2 20*  GLUCOSE 229*  BUN 53*  CREATININE 1.93*  CALCIUM 8.9   GFR: Estimated Creatinine Clearance: 55.7 mL/min (A) (by C-G formula based on SCr of 1.93 mg/dL (H)). Liver Function Tests: No results for input(s): AST, ALT, ALKPHOS, BILITOT, PROT, ALBUMIN in the last 168 hours. No results for input(s): LIPASE, AMYLASE in the last 168 hours. No results for input(s): AMMONIA in the last 168 hours. Coagulation Profile: No results for input(s): INR, PROTIME in the last 168 hours. Cardiac Enzymes: No results for input(s): CKTOTAL, CKMB, CKMBINDEX, TROPONINI in the last 168 hours. BNP (last 3 results) No results for input(s): PROBNP in the last 8760 hours. HbA1C: No results for input(s): HGBA1C in the last 72 hours. CBG:  Recent Labs Lab 11/16/16 1601  GLUCAP 226*    Radiological Exams on Admission: No results found.  EKG: Independently reviewed. q-waves in inferior leads, PVC, sinus rhythm  Assessment/Plan Active Problems:   Near syncope   Uncontrolled type 2 diabetes mellitus with hyperglycemia, without long-term current use of insulin (HCC)   Type 2 diabetes mellitus with hyperlipidemia (HCC)   Hyperlipidemia, unspecified   Obesity    Near-syncope Likely vaso-vagal vs hypotension related in setting of recently starting lisinopril and metoprolol. Symptoms of dizziness resolved shortly after near-syncopal episode. -telemetry -echocardiogram  Q-waves No chest pain. No known history of MI per patient.  -echocardiogram -cardiology consulted by EDP -telemetry -echocardiogram  Acute kidney injury Unknown baseline. S/p 1.5L in ED. -repeat BMP in AM  Hyponatremia Mild. Unknown baseline.  Asymptomatic.  -repeat BMP (will likely improve with IV fluids and improvement of hyperglycemia)  Diabetes mellitus, type 2 Unknown A1C. Patient takes Syngardy (empagliflozin and metformin) as an outpatient -hold Syngardy in setting of AKI -SSI  Hyperlipidemia Continue Crestory  Essential hypertension Controlled currently. -hold metoprolol and lisinopril -plan to restart metoprolol in AM  Left ankle swelling Chronic. Low pre-test probability for DVT.    DVT prophylaxis: Lovenox Code Status: DNR (explained and discussed with patient in presence of his wife) Family Communication: Wife at bedside Disposition Plan: Discharge in 24 hours pending near-syncope workup Consults called: Cardiology by EDP Admission status: Observation, telemetry   Cordelia Poche, MD [facility] Pager [phone]  If 7PM-7AM, please contact night-coverage [email or web address] Password [email or web address]  11/16/2016, 5:39 PM

## 2016-11-16 NOTE — ED Notes (Signed)
Per pt admitting provider started IV bolus.

## 2016-11-17 ENCOUNTER — Observation Stay (HOSPITAL_BASED_OUTPATIENT_CLINIC_OR_DEPARTMENT_OTHER): Payer: PPO

## 2016-11-17 DIAGNOSIS — R55 Syncope and collapse: Secondary | ICD-10-CM | POA: Diagnosis not present

## 2016-11-17 DIAGNOSIS — I10 Essential (primary) hypertension: Secondary | ICD-10-CM | POA: Diagnosis not present

## 2016-11-17 DIAGNOSIS — I951 Orthostatic hypotension: Secondary | ICD-10-CM | POA: Diagnosis not present

## 2016-11-17 DIAGNOSIS — I517 Cardiomegaly: Secondary | ICD-10-CM

## 2016-11-17 DIAGNOSIS — E785 Hyperlipidemia, unspecified: Secondary | ICD-10-CM

## 2016-11-17 DIAGNOSIS — N179 Acute kidney failure, unspecified: Secondary | ICD-10-CM | POA: Diagnosis not present

## 2016-11-17 DIAGNOSIS — I952 Hypotension due to drugs: Secondary | ICD-10-CM | POA: Diagnosis not present

## 2016-11-17 DIAGNOSIS — E871 Hypo-osmolality and hyponatremia: Secondary | ICD-10-CM

## 2016-11-17 DIAGNOSIS — E1169 Type 2 diabetes mellitus with other specified complication: Secondary | ICD-10-CM | POA: Diagnosis not present

## 2016-11-17 DIAGNOSIS — E1165 Type 2 diabetes mellitus with hyperglycemia: Secondary | ICD-10-CM | POA: Diagnosis not present

## 2016-11-17 DIAGNOSIS — E119 Type 2 diabetes mellitus without complications: Secondary | ICD-10-CM | POA: Diagnosis not present

## 2016-11-17 LAB — GLUCOSE, CAPILLARY
Glucose-Capillary: 208 mg/dL — ABNORMAL HIGH (ref 65–99)
Glucose-Capillary: 250 mg/dL — ABNORMAL HIGH (ref 65–99)

## 2016-11-17 LAB — D-DIMER, QUANTITATIVE: D-Dimer, Quant: 0.6 ug/mL-FEU — ABNORMAL HIGH (ref 0.00–0.50)

## 2016-11-17 LAB — ECHOCARDIOGRAM COMPLETE
Height: 74 in
WEIGHTICAEL: 4638.4 [oz_av]

## 2016-11-17 LAB — BASIC METABOLIC PANEL
Anion gap: 11 (ref 5–15)
BUN: 48 mg/dL — AB (ref 6–20)
CHLORIDE: 102 mmol/L (ref 101–111)
CO2: 19 mmol/L — AB (ref 22–32)
CREATININE: 1.58 mg/dL — AB (ref 0.61–1.24)
Calcium: 8.6 mg/dL — ABNORMAL LOW (ref 8.9–10.3)
GFR calc Af Amer: 51 mL/min — ABNORMAL LOW (ref >60)
GFR calc non Af Amer: 44 mL/min — ABNORMAL LOW (ref >60)
GLUCOSE: 207 mg/dL — AB (ref 65–99)
Potassium: 3.9 mmol/L (ref 3.5–5.1)
SODIUM: 132 mmol/L — AB (ref 135–145)

## 2016-11-17 LAB — CBC
HEMATOCRIT: 41 % (ref 39.0–52.0)
Hemoglobin: 13.9 g/dL (ref 13.0–17.0)
MCH: 30.7 pg (ref 26.0–34.0)
MCHC: 33.9 g/dL (ref 30.0–36.0)
MCV: 90.5 fL (ref 78.0–100.0)
PLATELETS: 205 10*3/uL (ref 150–400)
RBC: 4.53 MIL/uL (ref 4.22–5.81)
RDW: 13.2 % (ref 11.5–15.5)
WBC: 6.8 10*3/uL (ref 4.0–10.5)

## 2016-11-17 LAB — HIV ANTIBODY (ROUTINE TESTING W REFLEX): HIV SCREEN 4TH GENERATION: NONREACTIVE

## 2016-11-17 MED ORDER — METOPROLOL SUCCINATE ER 25 MG PO TB24: 12.5000 mg | ORAL_TABLET | Freq: Every morning | ORAL | Status: DC

## 2016-11-17 MED ORDER — PERFLUTREN LIPID MICROSPHERE
1.0000 mL | INTRAVENOUS | Status: AC | PRN
  Administered 2016-11-17: 2 mL via INTRAVENOUS
  Filled 2016-11-17: qty 10

## 2016-11-17 NOTE — Progress Notes (Signed)
Subjective:  Feels well this morning. Admission with lightheadedness and pre syncopal episodes.  Objective:  Vital Signs in the last 24 hours: Temp:  [98.1 F (36.7 C)-98.4 F (36.9 C)] 98.2 F (36.8 C) (10/07 0511) Pulse Rate:  [62-70] 69 (10/07 0511) Resp:  [12-23] 18 (10/07 0511) BP: (90-141)/(58-88) 133/69 (10/07 0511) SpO2:  [97 %-100 %] 98 % (10/07 0511) Weight:  [131.5 kg (289 lb 14.4 oz)-134.7 kg (297 lb)] 131.5 kg (289 lb 14.4 oz) (10/06 2016)  Intake/Output from previous day: 10/06 0701 - 10/07 0700 In: 2133.3 [I.V.:633.3; IV Piggyback:1500] Out: 1800 [Urine:1800] Intake/Output from this shift: No intake/output data recorded.  Physical Exam: Physical Exam  Nursing note and vitals reviewed. Constitutional: He is oriented to person, place, and time. He appears well-developed.  Moderately obese  HENT:  Head: Normocephalic.  Neck: No JVD present.  Cardiovascular: Normal rate, regular rhythm and normal heart sounds.   No murmur heard. Respiratory: Effort normal and breath sounds normal. He has no wheezes. He has no rales.  GI: Soft. Bowel sounds are normal.  Musculoskeletal: He exhibits edema (LLE trace).  Neurological: He is alert and oriented to person, place, and time.  Skin: Skin is warm and dry.  Left knee abrasion    Lab Results:  Recent Labs  11/16/16 1523 11/17/16 0320  WBC 7.5 6.8  HGB 14.8 13.9  PLT 204 205    Recent Labs  11/16/16 1523 11/17/16 0320  NA 131* 132*  K 4.7 3.9  CL 101 102  CO2 20* 19*  GLUCOSE 229* 207*  BUN 53* 48*  CREATININE 1.93* 1.58*    Imaging: Cardiac Studies: Echocardiogram 11/17/2016: Study Conclusions  - Left ventricle: The cavity size was normal. Wall thickness was   increased in a pattern of mild LVH. Systolic function was normal.   The estimated ejection fraction was in the range of 50% to 55%.   Wall motion was normal; there were no regional wall motion   abnormalities. Grade II diastolic  dysfunction. Elevated LAP. - Left atrium: The atrium was mildly dilated. - Right ventricle: The cavity size was moderately dilated. - Right atrium: The atrium was severely dilated. - Atrial septum: No defect or patent foramen ovale was identified. - Aortic valve: Valve area (VTI): 2.83 cm^2. Valve area (Vmax):   2.76 cm^2. Valve area (Vmean): 2.62 cm^2. - Mild tricuspid regurgitation. No other significant valvular   abnormality.  EKG 11/16/2017: Sinus rhythm 69 bpm, 1st deg AV block. Old inferior infarct. Nonspecific ST-T changes. Nonspecific T wave inversions inferior leads.  Assessment:  Near syncope: While hypotension due to medications, along with dehydration is a possibility, his RV appears dilated on echocardiogram and raise possibility of PE.  Hypertension Type 2 DM CKD 3 Hyponatremia  Recommendations: Agree with holding blood pressure medications today With his reduced renal function, recommend LE venous Doppler and V/Q scan. Continue statin Continue management of type 2 DM as you are doing.   LOS: 0 days    Rocco Kerkhoff J Bexley Laubach 11/17/2016, 11:07 AM

## 2016-11-17 NOTE — Progress Notes (Signed)
  Echocardiogram 2D Echocardiogram has been performed.  Merrie Roof F 11/17/2016, 10:18 AM

## 2016-11-17 NOTE — Evaluation (Signed)
Physical Therapy Evaluation and Discharge Patient Details Name: Travis Blair MRN: [id number] DOB: [birth year] Today's Date: 11/17/2016   History of Present Illness    HPI: Travis Blair is a 66 y.o. male with medical history significant of obesity, hyperlipidemia, diabetes mellitus, type 2. He presents today after having a near-syncopal episode. He was at a softball game and attempted to rise when he felt dizzy and lightheaded, falling down the bleachers. He did not lose consciousness but he did hit the back of his head. EMS was called for evaluation at that point. Nothing was tried to improve his dizziness. Symptoms improved by the time EMS arrived.   Clinical Impression  Pt presents with no limitations to mobility and no complaints related to dizziness or lightheadedness.  No PT needs.     Follow Up Recommendations No PT follow up    Equipment Recommendations  None recommended by PT    Recommendations for Other Services       Precautions / Restrictions Precautions Precautions: None Restrictions Weight Bearing Restrictions: No      Mobility  Bed Mobility Overal bed mobility: Independent                Transfers Overall transfer level: Independent                  Ambulation/Gait Ambulation/Gait assistance: Independent Ambulation Distance (Feet): 150 Feet Assistive device: None Gait Pattern/deviations: Step-through pattern   Gait velocity interpretation: at or above normal speed for age/gender    Stairs            Wheelchair Mobility    Modified Rankin (Stroke Patients Only)       Balance Overall balance assessment: Independent                                           Pertinent Vitals/Pain Pain Assessment: No/denies pain    Home Living Family/patient expects to be discharged to:: Private residence Living Arrangements: Spouse/significant other Available Help at Discharge: Family;Available 24  hours/day Type of Home: House Home Access: Stairs to enter Entrance Stairs-Rails: None Entrance Stairs-Number of Steps: 3 Home Layout: One level Home Equipment: None      Prior Function Level of Independence: Independent         Comments: no difficulty with any mobility in past     Hand Dominance        Extremity/Trunk Assessment   Upper Extremity Assessment Upper Extremity Assessment: Overall WFL for tasks assessed    Lower Extremity Assessment Lower Extremity Assessment: Overall WFL for tasks assessed    Cervical / Trunk Assessment Cervical / Trunk Assessment: Normal  Communication   Communication: No difficulties  Cognition Arousal/Alertness: Awake/alert Behavior During Therapy: WFL for tasks assessed/performed Overall Cognitive Status: Within Functional Limits for tasks assessed                                        General Comments      Exercises     Assessment/Plan    PT Assessment Patent does not need any further PT services  PT Problem List         PT Treatment Interventions      PT Goals (Current goals can be found in the Care Plan section)  Acute Rehab PT  Goals Patient Stated Goal: home; take a shower PT Goal Formulation: All assessment and education complete, DC therapy    Frequency     Barriers to discharge        Co-evaluation               AM-PAC PT "6 Clicks" Daily Activity  Outcome Measure Difficulty turning over in bed (including adjusting bedclothes, sheets and blankets)?: None Difficulty moving from lying on back to sitting on the side of the bed? : None Difficulty sitting down on and standing up from a chair with arms (e.g., wheelchair, bedside commode, etc,.)?: None Help needed moving to and from a bed to chair (including a wheelchair)?: None Help needed walking in hospital room?: None Help needed climbing 3-5 steps with a railing? : None 6 Click Score: 24    End of Session   Activity  Tolerance: Patient tolerated treatment well Patient left: in bed;with family/visitor present;with call bell/phone within reach Nurse Communication: Mobility status PT Visit Diagnosis: History of falling (Z91.81)    Time: 1165-7903 PT Time Calculation (min) (ACUTE ONLY): 10 min   Charges:   PT Evaluation $PT Eval Low Complexity: 1 Low     PT G Codes:   PT G-Codes **NOT FOR INPATIENT CLASS** Functional Assessment Tool Used: AM-PAC 6 Clicks Basic Mobility Functional Limitation: Mobility: Walking and moving around Mobility: Walking and Moving Around Current Status (Y3338): 0 percent impaired, limited or restricted Mobility: Walking and Moving Around Goal Status (V2919): 0 percent impaired, limited or restricted Mobility: Walking and Moving Around Discharge Status (T6606): 0 percent impaired, limited or restricted    Kearney Hard, PT, DPT, MS Board Certified Geriatric Clinical Specialist  [name] 11/17/2016, 11:38 AM

## 2016-11-17 NOTE — Progress Notes (Signed)
Pt inadvertantly seen by [facility] fellow last night - this is a patient of Dr. Irven Shelling. I spoke with his partner first thing today who will assume care this morning. Dayna Dunn PA-C

## 2016-11-17 NOTE — Discharge Summary (Signed)
Physician Discharge Summary  Travis Blair BLT:[id number] DOB: May 05, 1950 DOA: 11/16/2016  PCP: Anda Kraft, MD  Admit date: 11/16/2016 Discharge date: 11/17/2016  Admitted From: Home Disposition: Home  Recommendations for Outpatient Follow-up:  1. Follow up with PCP in 1 week 2. Follow up with cardiology in 1 week 3. Please obtain BMP/CBC in one week 4. Please follow up on the following pending results: None  Home Health: None Equipment/Devices: None  Discharge Condition: Stable CODE STATUS: DNR Diet recommendation: Heart healthy   Brief/Interim Summary:  Chief Complaint: Near-syncope  HPI: Travis Blair is a 66 y.o. male with medical history significant of obesity, hyperlipidemia, diabetes mellitus, type 2. He presents today after having a near-syncopal episode. He was at a softball game and attempted to rise when he felt dizzy and lightheaded, falling down the bleachers. He did not lose consciousness but he did hit the back of his head. EMS was called for evaluation at that point. Nothing was tried to improve his dizziness. Symptoms improved by the time EMS arrived.  ED Course: Vitals: Afebrile, normotensive, on room air Labs: Creatinine of 1.93, CO2 of 20 Imaging: None Medications/Course: 1.5L NS   Hospital course:  Near-syncope Likely vaso-vagal vs hypotension related in setting of recently starting lisinopril and metoprolol. Symptoms of dizziness resolved shortly after near-syncopal episode. Telemetry significant only for infrequent PVCs. Metoprolol dose halved. Lisinopril discontinued on discharge. Echocardiogram significant for right atrial enlargement, LVH and right ventricular enlargement.  Q-waves No chest pain. No known history of MI per patient. Telemetry significant only for infrequent PVCs. Started on metoprolol and lisinopril as an outpatient by Cardiology.  Acute kidney injury Unknown baseline. S/p 1.5L in ED. -repeat BMP in  AM  Hyponatremia Mild. Unknown baseline. Asymptomatic.  -repeat BMP (will likely improve with IV fluids and improvement of hyperglycemia)  Diabetes mellitus, type 2 Unknown A1C. Patient takes Syngardy (empagliflozin and metformin) as an outpatient. Continue Syngardy as an outpatient.  Hyperlipidemia Continue Crestor  Essential hypertension Controlled currently. Restart metoprolol at 12.5mg  daily and hold lisinopril.  Left ankle swelling Chronic. Low pre-test probability for DVT.  Right ventricular enlargement (moderate) Right atrial enlargement (severe) Seen on echocardiogram. Likely secondary to chronic sleep apnea. D-dimer obtained and 0.60 (negative when adjusted for age). No symptoms of chest pain, dyspnea and patient is without hypoxia. Low pre-test probability for pulmonary embolism. Discussed with patient who was in agreement with plan for discharge. Recommend follow-up with cardiology for right sided pressure (PA pressure was unable to be calculated, but would assume that it is chronically high).  Discharge Diagnoses:  Principal Problem:   Near syncope Active Problems:   Uncontrolled type 2 diabetes mellitus with hyperglycemia, without long-term current use of insulin (HCC)   Type 2 diabetes mellitus with hyperlipidemia (HCC)   Hyperlipidemia, unspecified   Obesity   Hyponatremia   AKI (acute kidney injury) ([place])   Right atrial dilatation   Right ventricular dilation    Discharge Instructions  Discharge Instructions    Call MD for:  difficulty breathing, headache or visual disturbances    Complete by:  As directed    Call MD for:  extreme fatigue    Complete by:  As directed    Call MD for:  persistant dizziness or light-headedness    Complete by:  As directed    Diet - low sodium heart healthy    Complete by:  As directed    Increase activity slowly    Complete by:  As directed  Allergies as of 11/17/2016   No Known Allergies     Medication  List    STOP taking these medications   lisinopril 10 MG tablet Commonly known as:  PRINIVIL,ZESTRIL   triamterene-hydrochlorothiazide 37.5-25 MG tablet Commonly known as:  MAXZIDE-25     TAKE these medications   acetaminophen 325 MG tablet Commonly known as:  TYLENOL Take 325-650 mg by mouth every 6 (six) hours as needed (for pain or headaches).   aspirin EC 81 MG tablet Take 81 mg by mouth daily.   metoprolol succinate 25 MG 24 hr tablet Commonly known as:  TOPROL-XL Take 0.5 tablets (12.5 mg total) by mouth every morning. What changed:  how much to take   omega-3 acid ethyl esters 1 g capsule Commonly known as:  LOVAZA Take 2 capsules by mouth 2 (two) times daily.   rosuvastatin 20 MG tablet Commonly known as:  CRESTOR Take 20 mg by mouth daily.   SYNJARDY XR 06-998 MG Tb24 Generic drug:  Empagliflozin-Metformin HCl ER Take 1 tablet by mouth daily.      Follow-up Information    Anda Kraft, MD. Schedule an appointment as soon as possible for a visit in 1 week(s).   Specialty:  Endocrinology Contact information: [address] [place] [address] [phone]        Adrian Prows, MD. Schedule an appointment as soon as possible for a visit in 1 week(s).   Specialty:  Cardiology Contact information: [address] [place]  [address] [phone]          No Known Allergies  Consultations:  Cardiology   Procedures/Studies:  No results found.  Echocardiogram (11/17/2016)  Study Conclusions  - Left ventricle: The cavity size was normal. Wall thickness was   increased in a pattern of mild LVH. Systolic function was normal.   The estimated ejection fraction was in the range of 50% to 55%.   Wall motion was normal; there were no regional wall motion   abnormalities. Grade II diastolic dysfunction. Elevated LAP. - Left atrium: The atrium was mildly dilated. - Right ventricle: The cavity size was moderately  dilated. - Right atrium: The atrium was severely dilated. - Atrial septum: No defect or patent foramen ovale was identified. - Aortic valve: Valve area (VTI): 2.83 cm^2. Valve area (Vmax):   2.76 cm^2. Valve area (Vmean): 2.62 cm^2. - Mild tricuspid regurgitation. No other significant valvular   abnormality.   Subjective: No chest pain, dyspnea, dizziness. On room air.  Discharge Exam: Vitals:   11/16/16 2016 11/17/16 0511  BP: 135/71 133/69  Pulse: 67 69  Resp: 18 18  Temp: 98.1 F (36.7 C) 98.2 F (36.8 C)  SpO2: 97% 98%   Vitals:   11/16/16 1915 11/16/16 1930 11/16/16 2016 11/17/16 0511  BP: 125/73 135/76 135/71 133/69  Pulse: 63 64 67 69  Resp: 18 17 18 18   Temp:   98.1 F (36.7 C) 98.2 F (36.8 C)  TempSrc:   Oral   SpO2: 99% 98% 97% 98%  Weight:   131.5 kg (289 lb 14.4 oz)   Height:   6\' 2"  (1.88 m)     General: Pt is alert, awake, not in acute distress Cardiovascular: RRR, S1/S2 +, no rubs, no gallops Respiratory: CTA bilaterally, no wheezing, no rhonchi Abdominal: Soft, NT, ND, bowel sounds + Extremities: left ankle edema, no cyanosis    The results of significant diagnostics from this hospitalization (including imaging, microbiology, ancillary and laboratory) are  listed below for reference.     Microbiology: No results found for this or any previous visit (from the past 240 hour(s)).   Labs: Basic Metabolic Panel:  Recent Labs Lab 11/16/16 1523 11/17/16 0320  NA 131* 132*  K 4.7 3.9  CL 101 102  CO2 20* 19*  GLUCOSE 229* 207*  BUN 53* 48*  CREATININE 1.93* 1.58*  CALCIUM 8.9 8.6*   CBC:  Recent Labs Lab 11/16/16 1523 11/17/16 0320  WBC 7.5 6.8  HGB 14.8 13.9  HCT 41.9 41.0  MCV 89.7 90.5  PLT 204 205   CBG:  Recent Labs Lab 11/16/16 1601 11/16/16 2134 11/17/16 0743 11/17/16 1151  GLUCAP 226* 255* 208* 250*   D-Dimer  Recent Labs  11/17/16 1234  DDIMER 0.60*   Urinalysis    Component Value Date/Time    COLORURINE YELLOW 11/16/2016 1601   APPEARANCEUR CLEAR 11/16/2016 1601   LABSPEC 1.018 11/16/2016 1601   PHURINE 5.0 11/16/2016 1601   GLUCOSEU >=500 (A) 11/16/2016 1601   HGBUR NEGATIVE 11/16/2016 1601   BILIRUBINUR NEGATIVE 11/16/2016 1601   KETONESUR NEGATIVE 11/16/2016 1601   PROTEINUR 100 (A) 11/16/2016 1601   NITRITE NEGATIVE 11/16/2016 1601   LEUKOCYTESUR NEGATIVE 11/16/2016 1601     SIGNED:   Cordelia Poche, MD [facility] 11/17/2016, 2:45 PM Pager [phone]  If 7PM-7AM, please contact night-coverage [email or web address] Password [email or web address]

## 2016-11-17 NOTE — Progress Notes (Signed)
Patient discharge teaching given, including activity, diet, follow-up appoints, and medications. Patient verbalized understanding of all discharge instructions. IV access was d/c'd. Vitals are stable. Skin is intact except as charted in most recent assessments. Pt to be escorted out by NT, to be driven home by family. 

## 2016-11-26 DIAGNOSIS — E1165 Type 2 diabetes mellitus with hyperglycemia: Secondary | ICD-10-CM | POA: Diagnosis not present

## 2016-11-26 DIAGNOSIS — I10 Essential (primary) hypertension: Secondary | ICD-10-CM | POA: Diagnosis not present

## 2016-11-26 DIAGNOSIS — Z09 Encounter for follow-up examination after completed treatment for conditions other than malignant neoplasm: Secondary | ICD-10-CM | POA: Diagnosis not present

## 2016-11-29 DIAGNOSIS — I10 Essential (primary) hypertension: Secondary | ICD-10-CM | POA: Diagnosis not present

## 2016-12-02 DIAGNOSIS — R9431 Abnormal electrocardiogram [ECG] [EKG]: Secondary | ICD-10-CM | POA: Diagnosis not present

## 2016-12-02 DIAGNOSIS — I10 Essential (primary) hypertension: Secondary | ICD-10-CM | POA: Diagnosis not present

## 2016-12-02 DIAGNOSIS — R0602 Shortness of breath: Secondary | ICD-10-CM | POA: Diagnosis not present

## 2016-12-02 DIAGNOSIS — E119 Type 2 diabetes mellitus without complications: Secondary | ICD-10-CM | POA: Diagnosis not present

## 2016-12-03 DIAGNOSIS — E7800 Pure hypercholesterolemia, unspecified: Secondary | ICD-10-CM | POA: Diagnosis not present

## 2016-12-04 DIAGNOSIS — R9431 Abnormal electrocardiogram [ECG] [EKG]: Secondary | ICD-10-CM | POA: Diagnosis not present

## 2016-12-04 DIAGNOSIS — E1165 Type 2 diabetes mellitus with hyperglycemia: Secondary | ICD-10-CM | POA: Diagnosis not present

## 2016-12-04 DIAGNOSIS — R0609 Other forms of dyspnea: Secondary | ICD-10-CM | POA: Diagnosis not present

## 2016-12-04 DIAGNOSIS — I10 Essential (primary) hypertension: Secondary | ICD-10-CM | POA: Diagnosis not present

## 2016-12-05 DIAGNOSIS — E781 Pure hyperglyceridemia: Secondary | ICD-10-CM | POA: Diagnosis not present

## 2016-12-05 DIAGNOSIS — G4733 Obstructive sleep apnea (adult) (pediatric): Secondary | ICD-10-CM | POA: Diagnosis not present

## 2016-12-05 DIAGNOSIS — Z9989 Dependence on other enabling machines and devices: Secondary | ICD-10-CM | POA: Diagnosis not present

## 2016-12-18 DIAGNOSIS — R9439 Abnormal result of other cardiovascular function study: Secondary | ICD-10-CM

## 2016-12-18 DIAGNOSIS — R0609 Other forms of dyspnea: Secondary | ICD-10-CM | POA: Diagnosis not present

## 2016-12-18 NOTE — Progress Notes (Deleted)
Travis Blair 12/04/2016 8:25 AM Location: [facility] Patient #: [phone] [phone] DOB: [birth year] Undefined / Language: Travis Blair / Race: White Male   History of Present Illness Travis Leep MD; 12/04/2016 12:22 PM) Patient words: Last OV 11/14/2016; Hospital FU, test results.  The patient is a 66 year old male who presents for a follow-up for Abnormal EKG. Patient with long-standing history of hypertension, hyperlipidemia, uncontrolled diabetes, obesity who recently had is complete physical exam on 11/07/2016, was found to have new Q waves in the inferior leads. Hence is referred to [place] by Travis Blair.  Since he was seen by Travis Blair and was started on antihypertensive therapy, he had two near syncopal episodes while at a ball game. He was admitted to the hospital. Echocardiogram below. His lisinopril and triameterene-HCTZ were stopped. Since then, he underwent stress test, which was high risk- details below.  He is trying to walk more with his wife. He does not have any chest pain but still has shortness of brath and easyy fatiguability.  Hospital echocardiogram 11/2016: Study Conclusions  - Left ventricle: The cavity size was normal. Wall thickness was increased in a pattern of mild LVH. Systolic function was normal. The estimated ejection fraction was in the range of 50% to 55%. Wall motion was normal; there were no regional wall motion abnormalities. Grade II diastolic dysfunction. Elevated LAP. - Left atrium: The atrium was mildly dilated. - Right ventricle: The cavity size was moderately dilated. - Right atrium: The atrium was severely dilated. - Atrial septum: No defect or patent foramen ovale was identified. - Aortic valve: Valve area (VTI): 2.83 cm^2. Valve area (Vmax): 2.76 cm^2. Valve area (Vmean): 2.62 cm^2. - Mild tricuspid regurgitation. No other significant valvular abnormality.    Problem List/Past Medical Travis Blair; 12/04/2016 11:18  AM) History of hyperlipidemia (Z86.39)  Leg edema, left (R60.0)  Sleep apnea (G47.30)  Abnormal EKG (R94.31)  EKG 10/4/118: Normal sinus rhythm at rate of 66 bpm, borderline criteria for left atrial enlargement, inferior infarct old. Poor R-wave progression, cannot exclude anterior infarct old. PVC. Nonspecific T-wave Travis Blair EKG 11/06/2016: Normal sinus rhythm at rate of 68 bpm, normal axis. Inferior infarct old. Poor R-wave progression, Cannot exclude anterior infarct old. Nonspecific T flattening. Uncontrolled insulin dependent diabetes mellitus (E11.65)  Acquired hypertriglyceridemia (E78.1)  Dyspnea on exertion (R06.09)  Lexiscan myoview stress test: 12/02/2016: 1. The exercise tolerance was not assessed due to pharmacologic stress. Prognostically, this is a high risk study. 2. Abnormal left ventricular systolic function. Inferior akinesis, lateral hypokinesis. 3. 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 Study Conclusions - Left ventricle: The cavity size was normal. Wall thickness was increased in a pattern of mild LVH. Systolic function was normal. The estimated ejection fraction was in the range of 50% to 55%. Wall motion was normal; there were no regional wall motion abnormalities. Grade II diastolic dysfunction. Elevated LAP. - Left atrium: The atrium was mildly dilated. - Right ventricle: The cavity size was moderately dilated. - Right atrium: The atrium was severely dilated. - Atrial septum: No defect or patent foramen ovale was identified. - Aortic valve: Valve area (VTI): 2.83 cm^2. Valve area (Vmax): 2.76 cm^2. Valve area (Vmean): 2.62 cm^2. - Mild tricuspid regurgitation. No other significant valvular abnormality. Essential hypertension (I10)  Laboratory examination (Z01.89)  11/29/2016: Glucose 199, creatinine 1.46, EGFR 50/58, potassium 4.8, CMP normal. 11/04/2016: CBC normal. Glucose 320, creatinine 1.2, potassium 5.1, CMP normal. Hemoglobin A1c  11.2%. Cholesterol 374, triglycerides 1210, HDL 27,  direct LDL 115. 04/30/2016: CBC normal. Creatinine 1.5, potassium 4.6, EGFR 51/61, CMP normal. Hemoglobin A1c 11.1%.  Allergies Travis Blair; December 30, 2016 11:18 AM) No Known Drug Allergies [11/14/2016]:  Family History Travis Blair; 2016/12/30 11:18 AM) Mother  Deceased. at age 70 from bone cancer, heart issues Father  Deceased. at age 85, Bypass at age 38, high cholesterol, high BP Sister 2  1 passed from cancer (older), 1 younger no heart issues Brother 1  older, no known heart issues  Social History Travis Blair; December 30, 2016 11:18 AM) Current tobacco use  Never smoker. Non Drinker/No Alcohol Use  Marital status  Married. Living Situation  Lives with spouse. Number of Children  2.  Past Surgical History Travis Blair; 12/30/16 11:18 AM) None [11/14/2016]:  Medication History Travis Blair; 30-Dec-2016 11:36 AM) Aspirin ('81MG'$  Tablet DR, 1 Oral daily) Active. Metoprolol Succinate ER ('25MG'$  Tablet ER 24HR, 1/2 Oral daily, Taken starting 11/14/2016) Active. Omega-3-acid Ethyl Esters (1GM Capsule, 2 (two) Capsule Oral two times daily, Taken starting 11/14/2016) Active. Rosuvastatin Calcium ('20MG'$  Tablet, 1 (one) Tablet Oral daily, Taken starting 11/14/2016) Active. Synjardy (5-'1000MG'$  Tablet, 1 Oral daily) Active. Medications Reconciled (list present)  Diagnostic Studies History Travis Blair; 30-Dec-2016 8:30 AM) Echocardiogram [11/17/2016]: - Left ventricle: The cavity size was normal. Wall thickness was increased in a pattern of mild LVH. Systolic function was normal. The estimated ejection fraction was in the range of 50% to 55%. Wall motion was normal; there were no regional wall motion abnormalities. Grade II diastolic dysfunction. Elevated LAP. - Left atrium: The atrium was mildly dilated. - Right ventricle: The cavity size was moderately dilated. - Right atrium: The atrium was severely  dilated. - Atrial septum: No defect or patent foramen ovale was identified. - Aortic valve: Valve area (VTI): 2.83 cm^2. Valve area (Vmax): 2.76 cm^2. Valve area (Vmean): 2.62 cm^2. - Mild tricuspid regurgitation. No other significant valvular abnormality. Nuclear stress test [12/02/2016]: 1. The exercise tolerance was not assessed due to pharmacologic stress. Prognostically, this is a high risk study. 2. Abnormal left ventricular systolic function. Inferior akinesis, lateral hypokinesis. 3. 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    Review of Systems Travis Leep, MD; 30-Dec-2016 12:29 PM) General Not Present- Appetite Loss and Weight Gain. Respiratory Present- Decreased Exercise Tolerance and Difficulty Breathing on Exertion. Not Present- Chronic Cough and Wakes up from Sleep Wheezing or Short of Breath. Cardiovascular Present- Edema and Leg Cramps. Gastrointestinal Not Present- Black, Tarry Stool and Difficulty Swallowing. Musculoskeletal Not Present- Decreased Range of Motion and Muscle Atrophy. Neurological Not Present- Attention Deficit. Psychiatric Not Present- Personality Changes and Suicidal Ideation. Endocrine Not Present- Cold Intolerance and Heat Intolerance. Hematology Not Present- Abnormal Bleeding. All other systems negative  Vitals Travis Blair; 12-30-2016 11:37 AM) 2016-12-30 11:31 AM Weight: 289.25 lb Height: 74in Body Surface Area: 2.54 m Body Mass Index: 37.14 kg/m  Pulse: 64 (Regular)  P.OX: 98% (Room air) BP: 124/60 (Sitting, Left Arm, Standard)       Physical Exam Joya Gaskins Manmeet Arzola MD; 12-30-2016 12:22 PM) General Mental Status-Alert. General Appearance-Cooperative, Appears stated age, Not in acute distress. Build & Nutrition-Well built and Morbidly obese.  Head and Neck Neck -Note: Short neck and difficult to evaluate JVD.  Thyroid Gland Characteristics - no palpable nodules,  no palpable enlargement.  Chest and Lung Exam Auscultation Breath sounds - Normal.  Cardiovascular Cardiovascular examination reveals -normal heart sounds, regular rate and rhythm with no murmurs. Inspection Jugular vein - Right - No Distention.  Abdomen Inspection  Contour - Obese and Pannus present. Palpation/Percussion Normal exam - Non Tender and No hepatosplenomegaly. Auscultation Normal exam - Bowel sounds normal.  Peripheral Vascular Lower Extremity Inspection - Bilateral - Inspection Normal. Palpation - Edema - Bilateral - 2+ Pitting edema. Femoral pulse - Bilateral - Normal. Popliteal pulse - Bilateral - Normal. Dorsalis pedis pulse - Bilateral - Absent. Posterior tibial pulse - Bilateral - Absent. Carotid arteries - Bilateral-No Carotid bruit.  Neurologic Neurologic evaluation reveals -alert and oriented x 3 with no impairment of recent or remote memory. Motor-Grossly intact without any focal deficits.  Musculoskeletal Global Assessment Left Lower Extremity - normal range of motion without pain. Right Lower Extremity - normal range of motion without pain.   Results Joya Gaskins Zyon Grout MD; 12/04/2016 12:29 PM) Labs  Name Value Range Date METABOLIC PANEL, COMPREHENSIVE [phone])   Collected: 11/29/2016 9:44 AM Glucose 199 mg/dL 65-99 mg/dL Collected: 11/29/2016 9:44 AM BUN 26 mg/dL 8-27 mg/dL Collected: 11/29/2016 9:44 AM Creatinine 1.46 mg/dL 0.76-1.27 mg/dL Collected: 11/29/2016 9:44 AM eGFR If NonAfricn Am 50 mL/min/1.73 >59 mL/min/1.73 Collected: 11/29/2016 9:44 AM eGFR If Africn Am 58 mL/min/1.73 >59 mL/min/1.73 Collected: 11/29/2016 9:44 AM BUN/Creatinine Ratio 18 10-24 Collected: 11/29/2016 9:44 AM Sodium 135 mmol/L 134-144 mmol/L Collected: 11/29/2016 9:44 AM Potassium 4.8 mmol/L 3.5-5.2 mmol/L Collected: 11/29/2016 9:44 AM Chloride 99 mmol/L 96-106 mmol/L Collected:  11/29/2016 9:44 AM Carbon Dioxide, Total 21 mmol/L 20-29 mmol/L Collected: 11/29/2016 9:44 AM Calcium 8.8 mg/dL 8.6-10.2 mg/dL Collected: 11/29/2016 9:44 AM Protein, Total 6.2 g/dL 6.0-8.5 g/dL Collected: 11/29/2016 9:44 AM Albumin 3.8 g/dL 3.6-4.8 g/dL Collected: 11/29/2016 9:44 AM Globulin, Total 2.4 g/dL 1.5-4.5 g/dL Collected: 11/29/2016 9:44 AM A/G Ratio 1.6 1.2-2.2 Collected: 11/29/2016 9:44 AM Bilirubin, Total 0.3 mg/dL 0.0-1.2 mg/dL Collected: 11/29/2016 9:44 AM Alkaline Phosphatase 61 IU/L 39-117 IU/L Collected: 11/29/2016 9:44 AM AST (SGOT) 22 IU/L 0-40 IU/L Collected: 11/29/2016 9:44 AM ALT (SGPT) 16 IU/L 0-44 IU/L Collected: 11/29/2016 9:44 AM  Procedures Name Value Date Myocardial perfusion imaging, tomographic (SPECT) (including attenuation correction, qualitative or quantitative wall motion, ejection fraction by first pass or gated technique, additional quantification, when performed); multiple studies, Comments: Lexiscan myoview stress test: 12/02/2016:  1. The exercise tolerance was not assessed due to pharmacologic  stress. Prognostically, this is a high risk study. 2. Abnormal left ventricular systolic function. Inferior akinesis,  lateral hypokinesis.  3. 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  Performed: 12/02/2016 10:43 AM    Assessment & Plan Joya Gaskins Markeda Narvaez MD; 12/04/2016 12:29 PM) Abnormal EKG (R94.31) Story: EKG 10/4/118: Normal sinus rhythm at rate of 66 bpm, borderline criteria for left atrial enlargement, inferior infarct old. Poor R-wave progression, cannot exclude anterior infarct old. PVC. Nonspecific T-wave  Travis Blair EKG 11/06/2016: Normal sinus rhythm at rate of 68 bpm, normal axis. Inferior infarct old. Poor R-wave progression, Cannot exclude anterior infarct old.  Nonspecific T flattening. Dyspnea on exertion (R06.09) Story: Lexiscan myoview stress test: 12/02/2016: 1. The exercise tolerance was not assessed due to pharmacologic stress. Prognostically, this is a high risk study. 2. Abnormal left ventricular systolic function. Inferior akinesis, lateral hypokinesis. 3. 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     Study Conclusions  - Left ventricle: The cavity size was normal. Wall thickness was increased in a pattern of mild LVH. Systolic function was normal. The estimated ejection fraction was in the range of 50% to 55%. Wall motion was normal; there were no regional wall motion abnormalities. Grade II diastolic dysfunction. Elevated LAP. -  Left atrium: The atrium was mildly dilated. - Right ventricle: The cavity size was moderately dilated. - Right atrium: The atrium was severely dilated. - Atrial septum: No defect or patent foramen ovale was identified. - Aortic valve: Valve area (VTI): 2.83 cm^2. Valve area (Vmax): 2.76 cm^2. Valve area (Vmean): 2.62 cm^2. - Mild tricuspid regurgitation. No other significant valvular abnormality. Future Plans 12/10/2016: Echocardiography, transthoracic, real-time with image documentation (2D), includes M-mode recording, when performed, complete, with spectral Doppler echocardiography, and with color flow Doppler echocardiography ([id number]) - one time Uncontrolled insulin dependent diabetes mellitus (E11.65) Acquired hypertriglyceridemia (E78.1) Essential hypertension (I10) Laboratory examination (I71.85) Story: 11/29/2016: Glucose 199, creatinine 1.46, EGFR 50/58, potassium 4.8, CMP normal.  11/04/2016: CBC normal. Glucose 320, creatinine 1.2, potassium 5.1, CMP normal. Hemoglobin A1c 11.2%. Cholesterol 374, triglycerides 1210, HDL 27, direct LDL 115.  04/30/2016: CBC normal. Creatinine 1.5, potassium 4.6, EGFR 51/61, CMP normal. Hemoglobin A1c 11.1%. Abnormal stress  test (R94.39) Story: Lexiscan myoview stress test: 12/02/2016: 1. The exercise tolerance was not assessed due to pharmacologic stress. Prognostically, this is a high risk study. 2. Abnormal left ventricular systolic function. Inferior akinesis, lateral hypokinesis. 3. 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  Note:-  Recommendations:  66 year old male with hypertension, uncontrolled type II diabetes mellitus, morbid obesity, inferior infarct on EKG and stress test with peri-infarct moderate ischemia. Reduced EF on stress. High risk stress test findings with symptoms of decreased exercise tolerance, dyspnea on exertion, and uncontrolled diabetes, I recommend proceeding with right and left heart catheter with coronary angiography with possible angioplasty. In the meantime, continue aspirin, statin, metoprolol.. Resume lisinopril at 5 mg daily. I have recommended him to reduce salt intake, keep legs elevated. WIll assess his right heart pressures before resimung diuretics, given his pre-syncopal episdoes.  Schedule for cardiac catheterization, and possible angioplasty. We discussed regarding risks, benefits, alternatives to this including stress testing, CTA and continued medical therapy. Patient wants to proceed. Understands <1-2% risk of death, stroke, MI, urgent CABG, bleeding, infection, renal failure but not limited to these. Greater than 50% of the time spent with face-to-face encounter with patient and evaluation of complex medical issues, review of external records and coordination of care.   CC: Dr. Deland Pretty.  Signed electronically by Travis Leep, MD (12/04/2016 12:29 PM)

## 2016-12-19 NOTE — Progress Notes (Signed)
Labs 12/18/2016: BUN/creatinine 38/1.65.  EGFR 43.  Glucose 191.  Sodium 139, potassium 5.3. H/H 13.9/40.3.  MCV 91.  Platelets 221 INR 1.0.

## 2016-12-20 ENCOUNTER — Ambulatory Visit (HOSPITAL_COMMUNITY)
Admission: RE | Admit: 2016-12-20 | Discharge: 2016-12-20 | Disposition: A | Discharge status: Home / Self Care | Payer: PPO | Source: Ambulatory Visit | Attending: Cardiology | Admitting: Cardiology

## 2016-12-20 ENCOUNTER — Encounter (HOSPITAL_COMMUNITY): Payer: Self-pay | Admitting: Cardiology

## 2016-12-20 ENCOUNTER — Encounter (HOSPITAL_COMMUNITY)
Admission: RE | Disposition: A | Discharge status: Home / Self Care | Payer: Self-pay | Source: Ambulatory Visit | Attending: Cardiology

## 2016-12-20 ENCOUNTER — Encounter: Payer: Self-pay | Admitting: Cardiothoracic Surgery

## 2016-12-20 DIAGNOSIS — E1165 Type 2 diabetes mellitus with hyperglycemia: Secondary | ICD-10-CM | POA: Insufficient documentation

## 2016-12-20 DIAGNOSIS — I25119 Atherosclerotic heart disease of native coronary artery with unspecified angina pectoris: Secondary | ICD-10-CM | POA: Insufficient documentation

## 2016-12-20 DIAGNOSIS — E781 Pure hyperglyceridemia: Secondary | ICD-10-CM | POA: Insufficient documentation

## 2016-12-20 DIAGNOSIS — E785 Hyperlipidemia, unspecified: Secondary | ICD-10-CM | POA: Insufficient documentation

## 2016-12-20 DIAGNOSIS — Z7982 Long term (current) use of aspirin: Secondary | ICD-10-CM | POA: Insufficient documentation

## 2016-12-20 DIAGNOSIS — R9439 Abnormal result of other cardiovascular function study: Secondary | ICD-10-CM

## 2016-12-20 DIAGNOSIS — I208 Other forms of angina pectoris: Secondary | ICD-10-CM | POA: Diagnosis not present

## 2016-12-20 DIAGNOSIS — Z6836 Body mass index (BMI) 36.0-36.9, adult: Secondary | ICD-10-CM | POA: Insufficient documentation

## 2016-12-20 DIAGNOSIS — I10 Essential (primary) hypertension: Secondary | ICD-10-CM | POA: Diagnosis not present

## 2016-12-20 HISTORY — PX: RIGHT/LEFT HEART CATH AND CORONARY ANGIOGRAPHY: CATH118266

## 2016-12-20 LAB — POCT I-STAT 3, ART BLOOD GAS (G3+)
Acid-base deficit: 5 mmol/L — ABNORMAL HIGH (ref 0.0–2.0)
Bicarbonate: 21.1 mmol/L (ref 20.0–28.0)
O2 SAT: 95 %
TCO2: 22 mmol/L (ref 22–32)
pCO2 arterial: 40 mmHg (ref 32.0–48.0)
pH, Arterial: 7.331 — ABNORMAL LOW (ref 7.350–7.450)
pO2, Arterial: 82 mmHg — ABNORMAL LOW (ref 83.0–108.0)

## 2016-12-20 LAB — POCT I-STAT 3, VENOUS BLOOD GAS (G3P V)
Acid-base deficit: 4 mmol/L — ABNORMAL HIGH (ref 0.0–2.0)
Bicarbonate: 21.9 mmol/L (ref 20.0–28.0)
O2 Saturation: 68 %
PCO2 VEN: 43.7 mmHg — AB (ref 44.0–60.0)
PH VEN: 7.309 (ref 7.250–7.430)
TCO2: 23 mmol/L (ref 22–32)
pO2, Ven: 39 mmHg (ref 32.0–45.0)

## 2016-12-20 LAB — GLUCOSE, CAPILLARY
GLUCOSE-CAPILLARY: 214 mg/dL — AB (ref 65–99)
Glucose-Capillary: 204 mg/dL — ABNORMAL HIGH (ref 65–99)
Glucose-Capillary: 258 mg/dL — ABNORMAL HIGH (ref 65–99)

## 2016-12-20 SURGERY — RIGHT/LEFT HEART CATH AND CORONARY ANGIOGRAPHY
Anesthesia: LOCAL

## 2016-12-20 MED ORDER — SODIUM CHLORIDE 0.9 % IV SOLN: 250.0000 mL | INTRAVENOUS | Status: DC | PRN

## 2016-12-20 MED ORDER — SODIUM CHLORIDE 0.9 % IV SOLN
INTRAVENOUS | Status: AC
  Administered 2016-12-20: 06:00:00 via INTRAVENOUS

## 2016-12-20 MED ORDER — LIDOCAINE HCL (PF) 1 % IJ SOLN
INTRAMUSCULAR | Status: AC
  Filled 2016-12-20: qty 30

## 2016-12-20 MED ORDER — LIDOCAINE HCL (PF) 1 % IJ SOLN
INTRAMUSCULAR | Status: DC | PRN
  Administered 2016-12-20 (×2): 2 mL

## 2016-12-20 MED ORDER — INSULIN ASPART 100 UNIT/ML ~~LOC~~ SOLN
5.0000 [IU] | Freq: Once | SUBCUTANEOUS | Status: AC
  Administered 2016-12-20: 5 [IU] via SUBCUTANEOUS

## 2016-12-20 MED ORDER — ISOSORBIDE MONONITRATE ER 30 MG PO TB24: 30.0000 mg | ORAL_TABLET | Freq: Every day | ORAL | 1 refills | Status: DC

## 2016-12-20 MED ORDER — HEPARIN SODIUM (PORCINE) 1000 UNIT/ML IJ SOLN
INTRAMUSCULAR | Status: AC
  Filled 2016-12-20: qty 1

## 2016-12-20 MED ORDER — ONDANSETRON HCL 4 MG/2ML IJ SOLN: 4.0000 mg | Freq: Four times a day (QID) | INTRAMUSCULAR | Status: DC | PRN

## 2016-12-20 MED ORDER — VERAPAMIL HCL 2.5 MG/ML IV SOLN
INTRAVENOUS | Status: AC
  Filled 2016-12-20: qty 2

## 2016-12-20 MED ORDER — IOPAMIDOL (ISOVUE-370) INJECTION 76%
INTRAVENOUS | Status: DC | PRN
  Administered 2016-12-20: 60 mL

## 2016-12-20 MED ORDER — SODIUM CHLORIDE 0.9% FLUSH: 3.0000 mL | Freq: Two times a day (BID) | INTRAVENOUS | Status: DC

## 2016-12-20 MED ORDER — ACETAMINOPHEN 325 MG PO TABS: 650.0000 mg | ORAL_TABLET | ORAL | Status: DC | PRN

## 2016-12-20 MED ORDER — HEPARIN (PORCINE) IN NACL 2-0.9 UNIT/ML-% IJ SOLN
INTRAMUSCULAR | Status: AC
  Filled 2016-12-20: qty 1000

## 2016-12-20 MED ORDER — ASPIRIN 81 MG PO CHEW: 81.0000 mg | CHEWABLE_TABLET | ORAL | Status: DC

## 2016-12-20 MED ORDER — MIDAZOLAM HCL 2 MG/2ML IJ SOLN
INTRAMUSCULAR | Status: AC
  Filled 2016-12-20: qty 2

## 2016-12-20 MED ORDER — HEPARIN (PORCINE) IN NACL 2-0.9 UNIT/ML-% IJ SOLN
INTRAMUSCULAR | Status: AC | PRN
  Administered 2016-12-20: 1000 mL

## 2016-12-20 MED ORDER — MIDAZOLAM HCL 2 MG/2ML IJ SOLN
INTRAMUSCULAR | Status: DC | PRN
  Administered 2016-12-20: 1 mg via INTRAVENOUS

## 2016-12-20 MED ORDER — SODIUM CHLORIDE 0.9 % IV SOLN: INTRAVENOUS | Status: AC

## 2016-12-20 MED ORDER — ISOSORBIDE MONONITRATE ER 30 MG PO TB24
30.0000 mg | ORAL_TABLET | Freq: Every day | ORAL | Status: DC
  Administered 2016-12-20: 30 mg via ORAL
  Filled 2016-12-20 (×2): qty 1

## 2016-12-20 MED ORDER — FENTANYL CITRATE (PF) 100 MCG/2ML IJ SOLN
INTRAMUSCULAR | Status: AC
  Filled 2016-12-20: qty 2

## 2016-12-20 MED ORDER — HEPARIN SODIUM (PORCINE) 1000 UNIT/ML IJ SOLN
INTRAMUSCULAR | Status: DC | PRN
  Administered 2016-12-20: 2000 [IU] via INTRAVENOUS
  Administered 2016-12-20: 4000 [IU] via INTRAVENOUS

## 2016-12-20 MED ORDER — VERAPAMIL HCL 2.5 MG/ML IV SOLN
INTRAVENOUS | Status: DC | PRN
  Administered 2016-12-20: 10 mL via INTRA_ARTERIAL

## 2016-12-20 MED ORDER — FENTANYL CITRATE (PF) 100 MCG/2ML IJ SOLN
INTRAMUSCULAR | Status: DC | PRN
  Administered 2016-12-20: 25 ug via INTRAVENOUS

## 2016-12-20 MED ORDER — SODIUM CHLORIDE 0.9% FLUSH: 3.0000 mL | INTRAVENOUS | Status: DC | PRN

## 2016-12-20 MED ORDER — IOPAMIDOL (ISOVUE-370) INJECTION 76%
INTRAVENOUS | Status: AC
  Filled 2016-12-20: qty 100

## 2016-12-20 SURGICAL SUPPLY — 12 items
CATH BALLN WEDGE 5F 110CM (CATHETERS) ×1 IMPLANT
CATH INFINITI 5 FR JL3.5 (CATHETERS) ×1 IMPLANT
CATH INFINITI JR4 5F (CATHETERS) ×2 IMPLANT
DEVICE RAD COMP TR BAND LRG (VASCULAR PRODUCTS) ×1 IMPLANT
GLIDESHEATH SLEND A-KIT 6F 22G (SHEATH) ×2 IMPLANT
GUIDEWIRE INQWIRE 1.5J.035X260 (WIRE) IMPLANT
INQWIRE 1.5J .035X260CM (WIRE) ×2
KIT HEART LEFT (KITS) ×2 IMPLANT
PACK CARDIAC CATHETERIZATION (CUSTOM PROCEDURE TRAY) ×2 IMPLANT
SHEATH GLIDE SLENDER 4/5FR (SHEATH) ×1 IMPLANT
TRANSDUCER W/STOPCOCK (MISCELLANEOUS) ×2 IMPLANT
TUBING CIL FLEX 10 FLL-RA (TUBING) ×2 IMPLANT

## 2016-12-20 NOTE — Progress Notes (Signed)
R brachial sheath pulled. VSS. Pressure held 8 minutes. Level 0. Transparent/gauze dsg applied. Clean, dry, intact.

## 2016-12-20 NOTE — Interval H&P Note (Signed)
History and Physical Interval Note:  12/20/2016 7:28 AM  Travis Blair  has presented today for surgery, with the diagnosis of abnormal stress test, sob  The various methods of treatment have been discussed with the patient and family. After consideration of risks, benefits and other options for treatment, the patient has consented to  Procedure(s): RIGHT/LEFT HEART CATH AND CORONARY ANGIOGRAPHY (N/A) as a surgical intervention .  The patient's history has been reviewed, patient examined, no change in status, stable for surgery.  I have reviewed the patient's chart and labs.  Questions were answered to the patient's satisfaction.    2016/2017 Appropriate Use Criteria for Coronary Revascularization Clinical Presentation: Diabetes Mellitus? Symptom Status? S/P CABG? Antianginal Therapy (# of long-acting drugs)? Results of Non-invasive testing? FFR/iFR results in all diseased vessels? Patient undergoing renal transplant? Patient undergoing percutaneous valve procedure (TAVR, MitraClip, Others)? Symptom Status:  Ischemic Symptoms  Non-invasive Testing:  High risk  If no or indeterminate stress test, FFR/iFR results in all diseased vessels:  N/A  Diabetes Mellitus:  Yes  S/P CABG:  No  Antianginal therapy (number of long-acting drugs):  1  Patient undergoing renal transplant:  No  Patient undergoing percutaneous valve procedure:  No    newline 1 Vessel Disease PCI CABG  No proximal LAD involvement, No proximal left dominant LCX involvement M (6); Indication 2 M (4); Indication 2   Proximal left dominant LCX involvement A (7); Indication 5 A (7); Indication 5   Proximal LAD involvement A (7); Indication 5 A (7); Indication 5   newline 2 Vessel Disease  No proximal LAD involvement A (7); Indication 8 M (6); Indication 8   Proximal LAD involvement A (7); Indication 14 A (8); Indication 14   newline 3 Vessel Disease  Low disease complexity (e.g., focal stenoses, SYNTAX <=22) A (7);  Indication 19 A (8); Indication 19   Intermediate or high disease complexity (e.g., SYNTAX >=23) M (5); Indication 23 A (8); Indication 23   newline Left Main Disease  Isolated LMCA disease: ostial or midshaft A (7); Indication 24 A (9); Indication 24   Isolated LMCA disease: bifurcation involvement M (5); Indication 25 A (9); Indication 25   LMCA ostial or midshaft, concurrent low disease burden multivessel disease (e.g., 1-2 additional focal stenoses, SYNTAX <=22) A (7); Indication 26 A (9); Indication 26   LMCA ostial or midshaft, concurrent intermediate or high disease burden multivessel disease (e.g., 1-2 additional bifurcation stenoses, long stenoses, SYNTAX >=23) M (4); Indication 27 A (9); Indication 27   LMCA bifurcation involvement, concurrent low disease burden multivessel disease (e.g., 1-2 additional focal stenoses, SYNTAX <=22) M (5); Indication 28 A (9); Indication 28   LMCA bifurcation involvement, concurrent intermediate or high disease burden multivessel disease (e.g., 1-2 additional bifurcation stenoses, long stenoses, SYNTAX >=23) R (3); Indication 29 A (9); Indication [place]

## 2016-12-20 NOTE — H&P (Signed)
Marisa Hufstetler Casa 12/04/2016 8:25 AM Location: [facility] Patient #: [phone] DOB: [birth year] Undefined / Language: [name] / Race: White Male   History of Present Illness Vernell Leep MD; 12/04/2016 12:22 PM) Patient words: Last OV 11/14/2016; Hospital FU, test results.  The patient is a 66 year old male who presents for a follow-up for Abnormal EKG. Patient with long-standing history of hypertension, hyperlipidemia, uncontrolled diabetes, obesity who recently had is complete physical exam on 11/07/2016, was found to have new Q waves in the inferior leads. Hence is referred to [place] by Dr. Shelia Media.  Since he was seen by Dr, Einar Gip and was started on antihypertensive therapy, he had two near syncopal episodes while at a ball game. He was admitted to the hospital. Echocardiogram below. His lisinopril and triameterene-HCTZ were stopped. Since then, he underwent stress test, which was high risk- details below.  He is trying to walk more with his wife. He does not have any chest pain but still has shortness of brath and easyy fatiguability.  Hospital echocardiogram 11/2016: Study Conclusions  - Left ventricle: The cavity size was normal. Wall thickness was increased in a pattern of mild LVH. Systolic function was normal. The estimated ejection fraction was in the range of 50% to 55%. Wall motion was normal; there were no regional wall motion abnormalities. Grade II diastolic dysfunction. Elevated LAP. - Left atrium: The atrium was mildly dilated. - Right ventricle: The cavity size was moderately dilated. - Right atrium: The atrium was severely dilated. - Atrial septum: No defect or patent foramen ovale was identified. - Aortic valve: Valve area (VTI): 2.83 cm^2. Valve area (Vmax): 2.76 cm^2. Valve area (Vmean): 2.62 cm^2. - Mild tricuspid regurgitation. No other significant valvular abnormality.    Problem List/Past Medical Frances Furbish Johnson; 12/04/2016  11:18 AM) History of hyperlipidemia (Z86.39)  Leg edema, left (R60.0)  Sleep apnea (G47.30)  Abnormal EKG (R94.31)  EKG 10/4/118: Normal sinus rhythm at rate of 66 bpm, borderline criteria for left atrial enlargement, inferior infarct old. Poor R-wave progression, cannot exclude anterior infarct old. PVC. Nonspecific T-wave PCP EKG 11/06/2016: Normal sinus rhythm at rate of 68 bpm, normal axis. Inferior infarct old. Poor R-wave progression, Cannot exclude anterior infarct old. Nonspecific T flattening. Uncontrolled insulin dependent diabetes mellitus (E11.65)  Acquired hypertriglyceridemia (E78.1)  Dyspnea on exertion (R06.09)  Lexiscan myoview stress test: 12/02/2016: 1. The exercise tolerance was not assessed due to pharmacologic stress. Prognostically, this is a high risk study. 2. Abnormal left ventricular systolic function. Inferior akinesis, lateral hypokinesis. 3. 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 Study Conclusions - Left ventricle: The cavity size was normal. Wall thickness was increased in a pattern of mild LVH. Systolic function was normal. The estimated ejection fraction was in the range of 50% to 55%. Wall motion was normal; there were no regional wall motion abnormalities. Grade II diastolic dysfunction. Elevated LAP. - Left atrium: The atrium was mildly dilated. - Right ventricle: The cavity size was moderately dilated. - Right atrium: The atrium was severely dilated. - Atrial septum: No defect or patent foramen ovale was identified. - Aortic valve: Valve area (VTI): 2.83 cm^2. Valve area (Vmax): 2.76 cm^2. Valve area (Vmean): 2.62 cm^2. - Mild tricuspid regurgitation. No other significant valvular abnormality. Essential hypertension (I10)  Laboratory examination (Z01.89)  11/29/2016: Glucose 199, creatinine 1.46, EGFR 50/58, potassium 4.8, CMP normal. 11/04/2016: CBC normal. Glucose 320, creatinine 1.2, potassium 5.1, CMP normal. Hemoglobin  A1c 11.2%. Cholesterol 374, triglycerides 1210, HDL 27,  direct LDL 115. 04/30/2016: CBC normal. Creatinine 1.5, potassium 4.6, EGFR 51/61, CMP normal. Hemoglobin A1c 11.1%.  Allergies Frances Furbish Johnson; 12-19-2016 11:18 AM) No Known Drug Allergies [11/14/2016]:  Family History Cheri Kearns; 19-Dec-2016 11:18 AM) Mother  Deceased. at age 89 from bone cancer, heart issues Father  Deceased. at age 6, Bypass at age 67, high cholesterol, high BP Sister 2  1 passed from cancer (older), 1 younger no heart issues Brother 1  older, no known heart issues  Social History Cheri Kearns; December 19, 2016 11:18 AM) Current tobacco use  Never smoker. Non Drinker/No Alcohol Use  Marital status  Married. Living Situation  Lives with spouse. Number of Children  2.  Past Surgical History Cheri Kearns; Dec 19, 2016 11:18 AM) None [11/14/2016]:  Medication History Frances Furbish Johnson; 12/19/2016 11:36 AM) Aspirin ('81MG'$  Tablet DR, 1 Oral daily) Active. Metoprolol Succinate ER ('25MG'$  Tablet ER 24HR, 1/2 Oral daily, Taken starting 11/14/2016) Active. Omega-3-acid Ethyl Esters (1GM Capsule, 2 (two) Capsule Oral two times daily, Taken starting 11/14/2016) Active. Rosuvastatin Calcium ('20MG'$  Tablet, 1 (one) Tablet Oral daily, Taken starting 11/14/2016) Active. Synjardy (5-'1000MG'$  Tablet, 1 Oral daily) Active. Medications Reconciled (list present)  Diagnostic Studies History Anderson Malta Sergeant; 12-19-2016 8:30 AM) Echocardiogram [11/17/2016]: - Left ventricle: The cavity size was normal. Wall thickness was increased in a pattern of mild LVH. Systolic function was normal. The estimated ejection fraction was in the range of 50% to 55%. Wall motion was normal; there were no regional wall motion abnormalities. Grade II diastolic dysfunction. Elevated LAP. - Left atrium: The atrium was mildly dilated. - Right ventricle: The cavity size was moderately dilated. - Right atrium: The atrium was severely  dilated. - Atrial septum: No defect or patent foramen ovale was identified. - Aortic valve: Valve area (VTI): 2.83 cm^2. Valve area (Vmax): 2.76 cm^2. Valve area (Vmean): 2.62 cm^2. - Mild tricuspid regurgitation. No other significant valvular abnormality. Nuclear stress test [12/02/2016]: 1. The exercise tolerance was not assessed due to pharmacologic stress. Prognostically, this is a high risk study. 2. Abnormal left ventricular systolic function. Inferior akinesis, lateral hypokinesis. 3. 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    Review of Systems Vernell Leep, MD; 19-Dec-2016 12:29 PM) General Not Present- Appetite Loss and Weight Gain. Respiratory Present- Decreased Exercise Tolerance and Difficulty Breathing on Exertion. Not Present- Chronic Cough and Wakes up from Sleep Wheezing or Short of Breath. Cardiovascular Present- Edema and Leg Cramps. Gastrointestinal Not Present- Black, Tarry Stool and Difficulty Swallowing. Musculoskeletal Not Present- Decreased Range of Motion and Muscle Atrophy. Neurological Not Present- Attention Deficit. Psychiatric Not Present- Personality Changes and Suicidal Ideation. Endocrine Not Present- Cold Intolerance and Heat Intolerance. Hematology Not Present- Abnormal Bleeding. All other systems negative  Vitals Frances Furbish Johnson; 2016-12-19 11:37 AM) 12/19/16 11:31 AM Weight: 289.25 lb Height: 74in Body Surface Area: 2.54 m Body Mass Index: 37.14 kg/m  Pulse: 64 (Regular)  P.OX: 98% (Room air) BP: 124/60 (Sitting, Left Arm, Standard)       Physical Exam Joya Gaskins Jaloni Davoli MD; 12/19/16 12:22 PM) General Mental Status-Alert. General Appearance-Cooperative, Appears stated age, Not in acute distress. Build & Nutrition-Well built and Morbidly obese.  Head and Neck Neck -Note: Short neck and difficult to evaluate JVD.  Thyroid Gland Characteristics - no  palpable nodules, no palpable enlargement.  Chest and Lung Exam Auscultation Breath sounds - Normal.  Cardiovascular Cardiovascular examination reveals -normal heart sounds, regular rate and rhythm with no murmurs. Inspection Jugular vein - Right - No Distention.  Abdomen Inspection  Contour - Obese and Pannus present. Palpation/Percussion Normal exam - Non Tender and No hepatosplenomegaly. Auscultation Normal exam - Bowel sounds normal.  Peripheral Vascular Lower Extremity Inspection - Bilateral - Inspection Normal. Palpation - Edema - Bilateral - 2+ Pitting edema. Femoral pulse - Bilateral - Normal. Popliteal pulse - Bilateral - Normal. Dorsalis pedis pulse - Bilateral - Absent. Posterior tibial pulse - Bilateral - Absent. Carotid arteries - Bilateral-No Carotid bruit.  Neurologic Neurologic evaluation reveals -alert and oriented x 3 with no impairment of recent or remote memory. Motor-Grossly intact without any focal deficits.  Musculoskeletal Global Assessment Left Lower Extremity - normal range of motion without pain. Right Lower Extremity - normal range of motion without pain.   Results Joya Gaskins Azarah Dacy MD; 12/04/2016 12:29 PM) Labs  Name Value Range Date METABOLIC PANEL, COMPREHENSIVE [id number])   Collected: 11/29/2016 9:44 AM Glucose 199 mg/dL 65-99 mg/dL Collected: 11/29/2016 9:44 AM BUN 26 mg/dL 8-27 mg/dL Collected: 11/29/2016 9:44 AM Creatinine 1.46 mg/dL 0.76-1.27 mg/dL Collected: 11/29/2016 9:44 AM eGFR If NonAfricn Am 50 mL/min/1.73 >59 mL/min/1.73 Collected: 11/29/2016 9:44 AM eGFR If Africn Am 58 mL/min/1.73 >59 mL/min/1.73 Collected: 11/29/2016 9:44 AM BUN/Creatinine Ratio 18 10-24 Collected: 11/29/2016 9:44 AM Sodium 135 mmol/L 134-144 mmol/L Collected: 11/29/2016 9:44 AM Potassium 4.8 mmol/L 3.5-5.2 mmol/L Collected: 11/29/2016 9:44 AM Chloride 99  mmol/L 96-106 mmol/L Collected: 11/29/2016 9:44 AM Carbon Dioxide, Total 21 mmol/L 20-29 mmol/L Collected: 11/29/2016 9:44 AM Calcium 8.8 mg/dL 8.6-10.2 mg/dL Collected: 11/29/2016 9:44 AM Protein, Total 6.2 g/dL 6.0-8.5 g/dL Collected: 11/29/2016 9:44 AM Albumin 3.8 g/dL 3.6-4.8 g/dL Collected: 11/29/2016 9:44 AM Globulin, Total 2.4 g/dL 1.5-4.5 g/dL Collected: 11/29/2016 9:44 AM A/G Ratio 1.6 1.2-2.2 Collected: 11/29/2016 9:44 AM Bilirubin, Total 0.3 mg/dL 0.0-1.2 mg/dL Collected: 11/29/2016 9:44 AM Alkaline Phosphatase 61 IU/L 39-117 IU/L Collected: 11/29/2016 9:44 AM AST (SGOT) 22 IU/L 0-40 IU/L Collected: 11/29/2016 9:44 AM ALT (SGPT) 16 IU/L 0-44 IU/L Collected: 11/29/2016 9:44 AM  Procedures Name Value Date Myocardial perfusion imaging, tomographic (SPECT) (including attenuation correction, qualitative or quantitative wall motion, ejection fraction by first pass or gated technique, additional quantification, when performed); multiple studies, Comments: Lexiscan myoview stress test: 12/02/2016:  1. The exercise tolerance was not assessed due to pharmacologic  stress. Prognostically, this is a high risk study. 2. Abnormal left ventricular systolic function. Inferior akinesis,  lateral hypokinesis.  3. 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  Performed: 12/02/2016 10:43 AM    Assessment & Plan Joya Gaskins Abdishakur Gottschall MD; 12/04/2016 12:29 PM) Abnormal EKG (R94.31) Story: EKG 10/4/118: Normal sinus rhythm at rate of 66 bpm, borderline criteria for left atrial enlargement, inferior infarct old. Poor R-wave progression, cannot exclude anterior infarct old. PVC. Nonspecific T-wave  PCP EKG 11/06/2016: Normal sinus rhythm at rate of 68 bpm, normal axis. Inferior infarct old. Poor R-wave progression,  Cannot exclude anterior infarct old. Nonspecific T flattening. Dyspnea on exertion (R06.09) Story: Lexiscan myoview stress test: 12/02/2016: 1. The exercise tolerance was not assessed due to pharmacologic stress. Prognostically, this is a high risk study. 2. Abnormal left ventricular systolic function. Inferior akinesis, lateral hypokinesis. 3. 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     Study Conclusions  - Left ventricle: The cavity size was normal. Wall thickness was increased in a pattern of mild LVH. Systolic function was normal. The estimated ejection fraction was in the range of 50% to 55%. Wall motion was normal; there were no regional wall motion abnormalities. Grade II diastolic dysfunction. Elevated LAP. -  Left atrium: The atrium was mildly dilated. - Right ventricle: The cavity size was moderately dilated. - Right atrium: The atrium was severely dilated. - Atrial septum: No defect or patent foramen ovale was identified. - Aortic valve: Valve area (VTI): 2.83 cm^2. Valve area (Vmax): 2.76 cm^2. Valve area (Vmean): 2.62 cm^2. - Mild tricuspid regurgitation. No other significant valvular abnormality. Future Plans 12/10/2016: Echocardiography, transthoracic, real-time with image documentation (2D), includes M-mode recording, when performed, complete, with spectral Doppler echocardiography, and with color flow Doppler echocardiography ([id number]) - one time Uncontrolled insulin dependent diabetes mellitus (E11.65) Acquired hypertriglyceridemia (E78.1) Essential hypertension (I10) Laboratory examination (D82.64) Story: 11/29/2016: Glucose 199, creatinine 1.46, EGFR 50/58, potassium 4.8, CMP normal.  11/04/2016: CBC normal. Glucose 320, creatinine 1.2, potassium 5.1, CMP normal. Hemoglobin A1c 11.2%. Cholesterol 374, triglycerides 1210, HDL 27, direct LDL 115.  04/30/2016: CBC normal. Creatinine 1.5, potassium 4.6, EGFR 51/61, CMP  normal. Hemoglobin A1c 11.1%. Abnormal stress test (R94.39) Story: Lexiscan myoview stress test: 12/02/2016: 1. The exercise tolerance was not assessed due to pharmacologic stress. Prognostically, this is a high risk study. 2. Abnormal left ventricular systolic function. Inferior akinesis, lateral hypokinesis. 3. 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  Note:-  Recommendations:  66 year old male with hypertension, uncontrolled type II diabetes mellitus, morbid obesity, inferior infarct on EKG and stress test with peri-infarct moderate ischemia. Reduced EF on stress. High risk stress test findings with symptoms of decreased exercise tolerance, dyspnea on exertion, and uncontrolled diabetes, I recommend proceeding with right and left heart catheter with coronary angiography with possible angioplasty. In the meantime, continue aspirin, statin, metoprolol.. Resume lisinopril at 5 mg daily. I have recommended him to reduce salt intake, keep legs elevated. WIll assess his right heart pressures before resimung diuretics, given his pre-syncopal episdoes.  Schedule for cardiac catheterization, and possible angioplasty. We discussed regarding risks, benefits, alternatives to this including stress testing, CTA and continued medical therapy. Patient wants to proceed. Understands <1-2% risk of death, stroke, MI, urgent CABG, bleeding, infection, renal failure but not limited to these. Greater than 50% of the time spent with face-to-face encounter with patient and evaluation of complex medical issues, review of external records and coordination of care.   CC: Dr. Deland Pretty.  Signed electronically by Vernell Leep, MD (12/04/2016 12:29 PM)

## 2016-12-20 NOTE — Discharge Instructions (Signed)
°  No SYNJARDY XR FOR 2 DAYS  Radial Site Care Refer to this sheet in the next few weeks. These instructions provide you with information about caring for yourself after your procedure. Your health care provider may also give you more specific instructions. Your treatment has been planned according to current medical practices, but problems sometimes occur. Call your health care provider if you have any problems or questions after your procedure. What can I expect after the procedure? After your procedure, it is typical to have the following:  Bruising at the radial site that usually fades within 1-2 weeks.  Blood collecting in the tissue (hematoma) that may be painful to the touch. It should usually decrease in size and tenderness within 1-2 weeks.  Follow these instructions at home:  Take medicines only as directed by your health care provider.  You may shower 24-48 hours after the procedure or as directed by your health care provider. Remove the bandage (dressing) and gently wash the site with plain soap and water. Pat the area dry with a clean towel. Do not rub the site, because this may cause bleeding.  Do not take baths, swim, or use a hot tub until your health care provider approves.  Check your insertion site every day for redness, swelling, or drainage.  Do not apply powder or lotion to the site.  Do not flex or bend the affected arm for 24 hours or as directed by your health care provider.  Do not push or pull heavy objects with the affected arm for 24 hours or as directed by your health care provider.  Do not lift over 10 lb (4.5 kg) for 5 days after your procedure or as directed by your health care provider.  Ask your health care provider when it is okay to: ? Return to work or school. ? Resume usual physical activities or sports. ? Resume sexual activity.  Do not drive home if you are discharged the same day as the procedure. Have someone else drive you.  You may drive  24 hours after the procedure unless otherwise instructed by your health care provider.  Do not operate machinery or power tools for 24 hours after the procedure.  If your procedure was done as an outpatient procedure, which means that you went home the same day as your procedure, a responsible adult should be with you for the first 24 hours after you arrive home.  Keep all follow-up visits as directed by your health care provider. This is important. Contact a health care provider if:  You have a fever.  You have chills.  You have increased bleeding from the radial site. Hold pressure on the site. Get help right away if:  You have unusual pain at the radial site.  You have redness, warmth, or swelling at the radial site.  You have drainage (other than a small amount of blood on the dressing) from the radial site.  The radial site is bleeding, and the bleeding does not stop after 30 minutes of holding steady pressure on the site.  Your arm or hand becomes pale, cool, tingly, or numb. This information is not intended to replace advice given to you by your health care provider. Make sure you discuss any questions you have with your health care provider. Document Released: 03/02/2010 Document Revised: 07/06/2015 Document Reviewed: 08/16/2013 Elsevier Interactive Patient Education  2018 [employer].

## 2016-12-24 ENCOUNTER — Institutional Professional Consult (permissible substitution): Payer: PPO | Admitting: Cardiothoracic Surgery

## 2016-12-24 ENCOUNTER — Other Ambulatory Visit: Payer: Self-pay

## 2016-12-24 ENCOUNTER — Encounter: Payer: Self-pay | Admitting: Cardiothoracic Surgery

## 2016-12-24 VITALS — BP 109/65 | HR 76 | Ht 73.5 in | Wt 283.0 lb

## 2016-12-24 DIAGNOSIS — I25118 Atherosclerotic heart disease of native coronary artery with other forms of angina pectoris: Secondary | ICD-10-CM | POA: Diagnosis not present

## 2016-12-24 NOTE — Progress Notes (Signed)
PCP is Deland Pretty, MD Referring Provider is [name], MD  Chief Complaint  Patient presents with  . New Patient (Initial Visit)    evaluation for CABG, ECHO 10/07/201/, Cath 12/20/2016  Patient examined, images and data from echocardiogram and coronary angiogram and right heart cath data all personally reviewed and counseled with patient  HPI: 66 year old 42 pound diabetic recently diagnosed with severe three-vessel CAD presents for evaluation for CABG.  The patient had a presyncopal event earlier this fall and was evaluated at the emergency department.  He ruled out for MI and neurologic disease but because of his diabetes and obesity was referred to cardiology for screening.  A stress test was strongly positive for ischemia.  Patient has current symptoms of significant dyspnea on exertion increasing fatigue and decreased exercise tolerance but no specific pain.  He also has noted significant increase in lower extremity edema left greater than right. Patient has history of sleep apnea and uses a CPAP routinely nightly.  Patient underwent echocardiogram showing fairly well preserved LV and RV function but dilated right atrium and moderately dilated right ventricle.  There is no significant valvular disease.  Patient underwent left and right heart catheterization showing severe three-vessel CAD, with 95% stenosis of the RCA, circumflex marginal and LAD.  Contrast was limited because of his stage III chronic kidney disease.  LVEDP was 14.  Right heart pressures were only minimally elevated with good cardiac output.  PA pressures 42/14 with a wedge of 19 CVP of 8.  Patient presents today with fairly stable symptoms of dyspnea on exertion very limited exercise tolerance but no resting angina or nocturnal angina, no orthopnea or PND.  He does have leg edema.  Patient has been taking oral hypoglycemic agents since August including metformin and an SG L T2 inhibitor.  Creatinine is  elevated at 2 with BUN of 55.  There is protein in his urine, he has mild-moderate metabolic acidosis.  The patient is a non-smoker and appears to have good pulmonary function. I believe the patient is a very appropriate candidate for multivessel CABG however his risk for acute postop and possible long-term renal failure needs to be minimized and there is a likelihood that his diabetic medication should be changed prior to surgery to reduce risk of acute renal failure.  Patient will be referred to nephrology for preoperative assessment both to reduce his operative risk for acute renal failure and to establish care in the likelihood that he will need postoperative nephrology care.  Past Medical History:  Diagnosis Date  . Diabetes mellitus without complication ([place])   . Hyperlipidemia   . Hypertension   . Obesity   . Sleep apnea     Past Surgical History:  Procedure Laterality Date  . HAND SURGERY      Family History  Problem Relation Age of Onset  . Cancer Other   . Hypertension Other   . Hyperlipidemia Other   . Heart disease Other   . Cancer Mother   . Heart disease Mother   . Cancer Father   . Heart disease Father   . Lymphoma Sister     Social History Social History   Tobacco Use  . Smoking status: Never Smoker  . Smokeless tobacco: Never Used  Substance Use Topics  . Alcohol use: No  . Drug use: No    Current Outpatient Medications  Medication Sig Dispense Refill  . acetaminophen (TYLENOL) 500 MG tablet Take 1,000 mg daily as needed by mouth  for moderate pain.    Marland Kitchen aspirin EC 81 MG tablet Take 81 mg by mouth daily.    . Empagliflozin-Metformin HCl ER (SYNJARDY XR) 06-998 MG TB24 Take 1 tablet by mouth daily.    . isosorbide mononitrate (IMDUR) 30 MG 24 hr tablet Take 1 tablet (30 mg total) daily by mouth. 30 tablet 1  . lisinopril (PRINIVIL,ZESTRIL) 10 MG tablet Take 5 mg daily by mouth.    . metoprolol succinate (TOPROL-XL) 25 MG 24 hr tablet Take 0.5 tablets  (12.5 mg total) by mouth every morning.    Marland Kitchen omega-3 acid ethyl esters (LOVAZA) 1 g capsule Take 2 capsules by mouth 2 (two) times daily.    . rosuvastatin (CRESTOR) 20 MG tablet Take 20 mg by mouth daily.     No current facility-administered medications for this visit.     No Known Allergies  Review of Systems  Right-hand-dominant No history of thoracic trauma Patient non-smoker Patient treated for pneumonia 6 months ago which was probably heart failure      Review of Systems :  [ y ] = yes, [  ] = no        General :  Weight gain [   ]    Weight loss  [yes intentional]  Fatigue [  ]  Fever [  ]  Chills  [  ]                                Weakness  [yes]           HEENT    Headache [  ]  Dizziness [  ]  Blurred vision [yes diabetic retinal disease] Glaucoma  [  ]                          Nosebleeds [  ] Painful or loose teeth [  ]        Cardiac :  Chest pain/ pressure [  ]  Resting SOB [  ] exertional SOB [yes]                        Orthopnea [  ]  Pedal edema  [yes]  Palpitations [  ] Syncope/presyncope [yes]                        Paroxysmal nocturnal dyspnea [  ]         Pulmonary : cough [  ]  wheezing [  ]  Hemoptysis [  ] Sputum [  ] Snoring [  ]                              Pneumothorax [  ]  Sleep apnea [yes on CPAP]        GI : Vomiting [  ]  Dysphagia [  ]  Melena  [  ]  Abdominal pain [  ] BRBPR [  ]              Heart burn [  ]  Constipation [  ] Diarrhea  [  ] Colonoscopy [   ]        GU : Hematuria [  ]  Dysuria [  ]  Nocturia [yes] UTI's [  ]  Vascular : Claudication [  ]  Rest pain [  ]  DVT [  ] Vein stripping [  ] leg ulcers [  ]                          TIA [  ] Stroke [  ]  Varicose veins [  ]        NEURO :  Headaches  [  ] Seizures [  ] Vision changes [he has history of left lateral rectus palsy, resolved] Paresthesias [  ]                                       Seizures [  ]        Musculoskeletal :  Arthritis [  ] Gout  [  ]  Back pain [chronic  low back pain]  Joint pain [  ]        Skin :  Rash [  ]  Melanoma [  ] Sores [  ]        Heme : Bleeding problems [  ]Clotting Disorders [  ] Anemia [  ]Blood Transfusion [ ]         Endocrine : Diabetes [yes] Heat or Cold intolerance [  ] Polyuria [  ]excessive thirst [ ]  yes        Psych : Depression [  ]  Anxiety [  ]  Psych hospitalizations [  ] Memory change [  ]                                                BP 109/65   Pulse 76   Ht 6' 1.5" (1.867 m)   Wt 283 lb (128.4 kg)   SpO2 94%   BMI 36.83 kg/m  Physical Exam       Physical Exam  General: Obese middle-aged white male no acute distress HEENT: Normocephalic pupils equal , dentition adequate Neck: Supple without JVD, adenopathy, or bruit Chest: Clear to auscultation, symmetrical breath sounds, no rhonchi, no tenderness             or deformity Cardiovascular: Regular rate and rhythm, no murmur, no gallop, peripheral pulses             palpable in all extremities Abdomen:  Soft, nontender, no palpable mass or organomegaly Extremities: Warm, well-perfused, no clubbing cyanosis  or tenderness,              no venous stasis changes of the legs however both legs with woody edema Rectal/GU: Deferred Neuro: Grossly non--focal and symmetrical throughout Skin: Clean and dry without rash or ulceration  Diagnostic Tests: Cardiac cath film and echocardiogram images personally reviewed as stated above  Impression: 66 year old with exertional anginal equivalent of heart failure symptoms including shortness of breath easy fatigue and decreased exercise tolerance.  He has severe three-vessel coronary artery disease and is an appropriate candidate for CABG.  However he has increased risk of postop renal failure due to his diabetes and probable nephrotoxic effects of his diabetic medications.  I will ask  nephrology for input on optimizing his renal function prior to elective CABG and to establish care so they can help deal  with  postoperative renal issues.  Plan: Return to schedule for surgery after nephrology assessment.   Len Childs, MD [facility] [phone]

## 2017-01-01 ENCOUNTER — Ambulatory Visit: Payer: PPO | Admitting: Cardiovascular Disease

## 2017-01-01 ENCOUNTER — Encounter: Payer: Self-pay | Admitting: Cardiovascular Disease

## 2017-01-01 VITALS — BP 140/72 | HR 72 | Ht 73.5 in | Wt 292.8 lb

## 2017-01-01 DIAGNOSIS — I2510 Atherosclerotic heart disease of native coronary artery without angina pectoris: Secondary | ICD-10-CM

## 2017-01-01 DIAGNOSIS — M7989 Other specified soft tissue disorders: Secondary | ICD-10-CM | POA: Diagnosis not present

## 2017-01-01 LAB — BASIC METABOLIC PANEL
BUN/Creatinine Ratio: 22 (ref 10–24)
BUN: 34 mg/dL — AB (ref 8–27)
CALCIUM: 9.2 mg/dL (ref 8.6–10.2)
CO2: 20 mmol/L (ref 20–29)
CREATININE: 1.54 mg/dL — AB (ref 0.76–1.27)
Chloride: 102 mmol/L (ref 96–106)
GFR calc Af Amer: 54 mL/min/{1.73_m2} — ABNORMAL LOW (ref >59)
GFR, EST NON AFRICAN AMERICAN: 47 mL/min/{1.73_m2} — AB (ref >59)
GLUCOSE: 219 mg/dL — AB (ref 65–99)
Potassium: 4.9 mmol/L (ref 3.5–5.2)
SODIUM: 137 mmol/L (ref 134–144)

## 2017-01-01 MED ORDER — HYDRALAZINE HCL 10 MG PO TABS: 10.0000 mg | ORAL_TABLET | Freq: Three times a day (TID) | ORAL | 3 refills | Status: DC

## 2017-01-01 NOTE — Progress Notes (Signed)
Cardiology Office Note:    Date:  01/01/2017   ID:  Travis Blair, DOB [birth year], MRN [id number]  PCP:  Deland Pretty, MD  Cardiologist:  [name], MD    Referring MD: Deland Pretty, MD   Chief Complaint  Patient presents with  . Coronary Artery Disease    History of Present Illness:    Travis Blair is a 66 y.o. male with a hx of CAD, DM Seen with wife Travis Blair   Has had DOE with walking  -  Was placed on Imdur, Metoprolol  , passed out , Has CKD - Lisinopril was stopped.  Worse for the past month. Has a + stress test,   Cath shows severe CAD.   Has seen Dr. [name]  - recommnended nephrology consult.  Has not heard back from nephrologist   Has had swelling of left leg for 8 months .  No angina ( is diabetic)     Past Medical History:  Diagnosis Date  . Diabetes mellitus without complication ([place])   . Hyperlipidemia   . Hypertension   . Obesity   . Sleep apnea     Past Surgical History:  Procedure Laterality Date  . HAND SURGERY    . RIGHT/LEFT HEART CATH AND CORONARY ANGIOGRAPHY N/A 12/20/2016   Procedure: RIGHT/LEFT HEART CATH AND CORONARY ANGIOGRAPHY;  Surgeon: [name], MD;  Location: [facility];  Service: Cardiovascular;  Laterality: N/A;    Current Medications: Current Meds  Medication Sig  . acetaminophen (TYLENOL) 500 MG tablet Take 1,000 mg daily as needed by mouth for moderate pain.  Marland Kitchen aspirin EC 81 MG tablet Take 81 mg by mouth daily.  . isosorbide mononitrate (IMDUR) 30 MG 24 hr tablet Take 1 tablet (30 mg total) daily by mouth.  Marland Kitchen lisinopril (PRINIVIL,ZESTRIL) 10 MG tablet Take 5 mg by mouth every other day.   . metoprolol succinate (TOPROL-XL) 25 MG 24 hr tablet Take 0.5 tablets (12.5 mg total) by mouth every morning.     Allergies:   Patient has no known allergies.   Social History   Socioeconomic History  . Marital status: Married    Spouse name: None  . Number of children: None  . Years of  education: None  . Highest education level: None  Social Needs  . Financial resource strain: None  . Food insecurity - worry: None  . Food insecurity - inability: None  . Transportation needs - medical: None  . Transportation needs - non-medical: None  Occupational History  . None  Tobacco Use  . Smoking status: Never Smoker  . Smokeless tobacco: Never Used  Substance and Sexual Activity  . Alcohol use: No  . Drug use: No  . Sexual activity: No  Other Topics Concern  . None  Social History Narrative  . None     Family History: The patient's family history includes Cancer in his father, mother, and other; Heart disease in his father, mother, and other; Hyperlipidemia in his other; Hypertension in his other; Lymphoma in his sister. ROS:   Please see the history of present illness.     All other systems reviewed and are negative.  EKGs/Labs/Other Studies Reviewed:    The following studies were reviewed today: Cath films, previous records.   EKG:  EKG is  ordered today.  The ekg ordered 11/08/16 demonstrates  NSR , Inf MI   Recent Labs: 11/17/2016: BUN 48; Creatinine, Ser 1.58; Hemoglobin 13.9; Platelets 205; Potassium 3.9; Sodium  132  Recent Lipid Panel No results found for: CHOL, TRIG, HDL, CHOLHDL, VLDL, LDLCALC, LDLDIRECT  Physical Exam:    VS:  BP 140/72   Pulse 72   Ht 6' 1.5" (1.867 m)   Wt 292 lb 12.8 oz (132.8 kg)   SpO2 93%   BMI 38.11 kg/m     Wt Readings from Last 3 Encounters:  01/01/17 292 lb 12.8 oz (132.8 kg)  12/24/16 283 lb (128.4 kg)  12/20/16 280 lb (127 kg)     GEN:  Obese male,  well developed in no acute distress HEENT: Normal NECK: No JVD; No carotid bruits LYMPHATICS: No lymphadenopathy CARDIAC: RR, no murmurs, rubs, gallops RESPIRATORY:  Clear to auscultation without rales, wheezing or rhonchi  ABDOMEN: Soft, non-tender, non-distended MUSCULOSKELETAL:  No edema; No deformity  SKIN: Warm and dry NEUROLOGIC:  Alert and oriented x  3 PSYCHIATRIC:  Normal affect   ASSESSMENT:    No diagnosis found. PLAN:    In order of problems listed above:  1. CAD :  Has severe CAD.   Has seen Dr. PVT Has greatly improved his diet.    Still have severe DOE that limits his activity.  2.   CKD - still on Lisinopril,    Will discuss with nephrology  Will hold Lisinopril for now BP is ok May need hydralazine   3.   DM - under fairly good control , his diet has improved.    Medication Adjustments/Labs and Tests Ordered: Current medicines are reviewed at length with the patient today.  Concerns regarding medicines are outlined above.  No orders of the defined types were placed in this encounter.  No orders of the defined types were placed in this encounter.   Signed, [name], MD  01/01/2017 8:57 AM    [place]

## 2017-01-01 NOTE — Patient Instructions (Addendum)
Medication Instructions:  Your physician has recommended you make the following change in your medication:  STOP Lisinopril   Labwork: TODAY - basic metabolic panel   Testing/Procedures: Your physician has requested that you have a lower or upper extremity venous duplex. This test is an ultrasound of the veins in the legs or arms. It looks at venous blood flow that carries blood from the heart to the legs or arms. Allow one hour for a Lower Venous exam. Allow thirty minutes for an Upper Venous exam. There are no restrictions or special instructions.    Follow-Up: Your physician recommends that you schedule a follow-up appointment in: 2 months with Dr. Acie Fredrickson   If you need a refill on your cardiac medications before your next appointment, please call your pharmacy.   Thank you for choosing [facility]! Christen Bame, RN [phone]

## 2017-01-03 DIAGNOSIS — I129 Hypertensive chronic kidney disease with stage 1 through stage 4 chronic kidney disease, or unspecified chronic kidney disease: Secondary | ICD-10-CM | POA: Diagnosis not present

## 2017-01-03 DIAGNOSIS — E1122 Type 2 diabetes mellitus with diabetic chronic kidney disease: Secondary | ICD-10-CM | POA: Diagnosis not present

## 2017-01-03 DIAGNOSIS — N179 Acute kidney failure, unspecified: Secondary | ICD-10-CM | POA: Diagnosis not present

## 2017-01-06 ENCOUNTER — Telehealth: Payer: Self-pay | Admitting: Nurse Practitioner

## 2017-01-06 ENCOUNTER — Encounter (HOSPITAL_COMMUNITY): Payer: Self-pay

## 2017-01-06 ENCOUNTER — Ambulatory Visit (HOSPITAL_COMMUNITY)
Admission: RE | Admit: 2017-01-06 | Discharge: 2017-01-06 | Disposition: A | Discharge status: Home / Self Care | Payer: PPO | Source: Ambulatory Visit | Attending: Cardiology | Admitting: Cardiology

## 2017-01-06 DIAGNOSIS — I82432 Acute embolism and thrombosis of left popliteal vein: Secondary | ICD-10-CM | POA: Diagnosis not present

## 2017-01-06 DIAGNOSIS — M7989 Other specified soft tissue disorders: Secondary | ICD-10-CM | POA: Insufficient documentation

## 2017-01-06 DIAGNOSIS — I82442 Acute embolism and thrombosis of left tibial vein: Secondary | ICD-10-CM | POA: Insufficient documentation

## 2017-01-06 DIAGNOSIS — I824Z2 Acute embolism and thrombosis of unspecified deep veins of left distal lower extremity: Secondary | ICD-10-CM | POA: Insufficient documentation

## 2017-01-06 MED ORDER — APIXABAN 5 MG PO TABS: 5.0000 mg | ORAL_TABLET | Freq: Two times a day (BID) | ORAL | 11 refills | Status: DC

## 2017-01-06 NOTE — Progress Notes (Unsigned)
Today's bilateral lower extremity venous duplex is positive for DVT in the left distal femoral and popliteal veins. Preliminary results given to Dr. Acie Fredrickson via Sharyn Lull. Patient advised not to go to hospital because prescription will be given to him and Sharyn Lull will call his cell with all information.

## 2017-01-06 NOTE — Telephone Encounter (Signed)
Pt presented with symptomatic unilateral leg edema Now found to have a DVT. Eliquis 5 mg BID. See note written by Christen Bame, RN.

## 2017-01-06 NOTE — Telephone Encounter (Signed)
Received call from Katharine Look from our vascular department at [facility] stating patient's ultrasound is positive for DVT in left distal femoral and popliteal vein. I advised her that I will contact Dr. Acie Fredrickson who is working in the hospital. Per Dr. Acie Fredrickson, I advised Katharine Look that patient can be released from the office and I will call him with instructions.  Spoke with patient and advised that Dr. Acie Fredrickson wants him to start Eliquis 5 mg BID for the DVT; I verified his pharmacy. I  advised him to rest, elevate the legs and to avoid massage. I advised that if he develops SOB, to call 911. I advised him to d/c aspirin 81 mg. Patient verbalized understanding and agreement with plan and is aware to call back with questions or concerns. He thanked me for the call.

## 2017-01-07 ENCOUNTER — Encounter: Payer: PPO | Admitting: Cardiothoracic Surgery

## 2017-01-07 ENCOUNTER — Ambulatory Visit: Payer: PPO | Admitting: Cardiothoracic Surgery

## 2017-01-07 ENCOUNTER — Encounter: Payer: Self-pay | Admitting: Cardiothoracic Surgery

## 2017-01-07 ENCOUNTER — Other Ambulatory Visit: Payer: Self-pay | Admitting: *Deleted

## 2017-01-07 ENCOUNTER — Other Ambulatory Visit: Payer: Self-pay

## 2017-01-07 VITALS — BP 157/81 | HR 66 | Resp 16 | Ht 73.5 in | Wt 292.0 lb

## 2017-01-07 DIAGNOSIS — N179 Acute kidney failure, unspecified: Secondary | ICD-10-CM

## 2017-01-07 DIAGNOSIS — I82432 Acute embolism and thrombosis of left popliteal vein: Secondary | ICD-10-CM

## 2017-01-07 DIAGNOSIS — I25118 Atherosclerotic heart disease of native coronary artery with other forms of angina pectoris: Secondary | ICD-10-CM

## 2017-01-07 DIAGNOSIS — Z86718 Personal history of other venous thrombosis and embolism: Secondary | ICD-10-CM

## 2017-01-07 NOTE — Progress Notes (Signed)
PCP is Deland Pretty, MD Referring Provider is [name], MD  Chief Complaint  Patient presents with  . Coronary Artery Disease    revisit after having seen [facility]    HPI: Patient returns to discuss treatment of his  three-vessel CAD He denies symptoms of unstable angina His main complaint is pain and swelling in his left leg He recently had an ultrasound which showed left leg DVT and was started on Eliquis by his cardiologist. He is taken 2 doses and his leg is feeling better. The DVT appears to be significant and the patient will need to wait a few weeks until elective CABG. I plan on seeing him back in 3-4 weeks with a repeat ultrasound of the leg to assess timing of surgery. We discussed the importance of leg elevation and to avoid standing and sitting with legs down.  At the last visit we discussed the risk of acute on chronic renal failure  after CABG and discussed the medications he was on which had renal side effects-metformin and a glucagon receptor agonist. He was referred to nephrology for evaluation of his renal function and to optimize his renal status prior to CABG. Apparently his nephrologist told him to stay off the combination drug. His blood sugars have run between 160 and 190. He is not taking any medication now. I recommended that he start a more conventional hypoglycemic. He is not willing to see his primary care physician because of previous experiences so I have given him a prescription for Glucotrol 10 mg daily to take until surgery. He is advised to check his blood sugars twice a day.   Past Medical History:  Diagnosis Date  . Diabetes mellitus without complication ([place])   . Hyperlipidemia   . Hypertension   . Obesity   . Sleep apnea     Past Surgical History:  Procedure Laterality Date  . HAND SURGERY    . RIGHT/LEFT HEART CATH AND CORONARY ANGIOGRAPHY N/A 12/20/2016   Procedure: RIGHT/LEFT HEART CATH AND CORONARY ANGIOGRAPHY;  Surgeon:  [name], MD;  Location: [place] [facility];  Service: Cardiovascular;  Laterality: N/A;    Family History  Problem Relation Age of Onset  . Cancer Other   . Hypertension Other   . Hyperlipidemia Other   . Heart disease Other   . Cancer Mother   . Heart disease Mother   . Cancer Father   . Heart disease Father   . Lymphoma Sister     Social History Social History   Tobacco Use  . Smoking status: Never Smoker  . Smokeless tobacco: Never Used  Substance Use Topics  . Alcohol use: No  . Drug use: No    Current Outpatient Medications  Medication Sig Dispense Refill  . apixaban (ELIQUIS) 5 MG TABS tablet Take 1 tablet (5 mg total) by mouth 2 (two) times daily. 60 tablet 11  . isosorbide mononitrate (IMDUR) 30 MG 24 hr tablet Take 1 tablet (30 mg total) daily by mouth. 30 tablet 1  . metoprolol succinate (TOPROL-XL) 25 MG 24 hr tablet Take 0.5 tablets (12.5 mg total) by mouth every morning.    Marland Kitchen acetaminophen (TYLENOL) 500 MG tablet Take 1,000 mg daily as needed by mouth for moderate pain.     No current facility-administered medications for this visit.     No Known Allergies  Review of Systems  No recent change in weight fever cough Increasing left leg pain and swelling from the DVT  BP Marland Kitchen)  157/81 (BP Location: Left Arm, Patient Position: Sitting, Cuff Size: Large)   Pulse 66   Resp 16   Ht 6' 1.5" (1.867 m)   Wt 292 lb (132.5 kg)   SpO2 98% Comment: ON RA  BMI 38.00 kg/m  Physical Exam      Exam    General- alert and comfortable   Lungs- clear without rales, wheezes   Cor- regular rate and rhythm, no murmur , gallop   Abdomen- soft, non-tender   Extremities - warm, left leg with swelling and upper calf tenderness   Neuro- oriented, appropriate, no focal weakness   Diagnostic Tests: None  Impression: Severe three-vessel CAD Diabetes with chronic renal insufficiency Recent diagnosis of left leg DVT now started on Eliquis   Plan:Patient is  not having unstable angina. He should have the DVT treated prior to elective CABG. I will see him back in 3-4 weeks, repeat the leg ultrasound and discuss setting a date for CABG.   Len Childs, MD [facility] [phone]

## 2017-01-08 ENCOUNTER — Other Ambulatory Visit: Payer: Self-pay | Admitting: Nephrology

## 2017-01-08 DIAGNOSIS — N179 Acute kidney failure, unspecified: Secondary | ICD-10-CM

## 2017-01-08 DIAGNOSIS — I129 Hypertensive chronic kidney disease with stage 1 through stage 4 chronic kidney disease, or unspecified chronic kidney disease: Secondary | ICD-10-CM

## 2017-01-08 DIAGNOSIS — E1322 Other specified diabetes mellitus with diabetic chronic kidney disease: Secondary | ICD-10-CM

## 2017-01-13 ENCOUNTER — Encounter: Payer: Self-pay | Admitting: *Deleted

## 2017-01-13 ENCOUNTER — Ambulatory Visit
Admission: RE | Admit: 2017-01-13 | Discharge: 2017-01-13 | Disposition: A | Discharge status: Home / Self Care | Payer: PPO | Source: Ambulatory Visit | Attending: Nephrology | Admitting: Nephrology

## 2017-01-13 DIAGNOSIS — N179 Acute kidney failure, unspecified: Secondary | ICD-10-CM | POA: Diagnosis not present

## 2017-01-13 DIAGNOSIS — I129 Hypertensive chronic kidney disease with stage 1 through stage 4 chronic kidney disease, or unspecified chronic kidney disease: Secondary | ICD-10-CM

## 2017-01-13 DIAGNOSIS — E1322 Other specified diabetes mellitus with diabetic chronic kidney disease: Secondary | ICD-10-CM

## 2017-01-28 ENCOUNTER — Ambulatory Visit (HOSPITAL_COMMUNITY)
Admission: RE | Admit: 2017-01-28 | Discharge: 2017-01-28 | Disposition: A | Discharge status: Home / Self Care | Payer: PPO | Source: Ambulatory Visit | Attending: Vascular Surgery | Admitting: Vascular Surgery

## 2017-01-28 DIAGNOSIS — Z86718 Personal history of other venous thrombosis and embolism: Secondary | ICD-10-CM | POA: Insufficient documentation

## 2017-01-28 DIAGNOSIS — Z09 Encounter for follow-up examination after completed treatment for conditions other than malignant neoplasm: Secondary | ICD-10-CM | POA: Diagnosis not present

## 2017-01-29 ENCOUNTER — Other Ambulatory Visit: Payer: Self-pay

## 2017-01-29 ENCOUNTER — Encounter: Payer: Self-pay | Admitting: Cardiothoracic Surgery

## 2017-01-29 ENCOUNTER — Ambulatory Visit: Payer: PPO | Admitting: Cardiothoracic Surgery

## 2017-01-29 VITALS — BP 162/87 | HR 62 | Ht 74.0 in | Wt 283.0 lb

## 2017-01-29 DIAGNOSIS — I2510 Atherosclerotic heart disease of native coronary artery without angina pectoris: Secondary | ICD-10-CM | POA: Diagnosis not present

## 2017-01-29 NOTE — Progress Notes (Signed)
PCP is Deland Pretty, MD Referring Provider is [name], MD  Chief Complaint  Patient presents with  . Follow-up   Patient presents to discuss his diagnosis of severe three-vessel coronary artery disease and stable angina. HPI: Very nice obese 66 year old diabetic has significant three-vessel CAD with stable angina. He was scheduled for multivessel CABG proximally 3-4 weeks ago when he was diagnosed with a left leg DVT and placed on Eliquis He did not have a pulmonary embolus. He states his leg swelling has improved but not resolved. He is walking with less limp. He has had no symptoms of angina.he has had no bleeding problems  The patient had a duplex scan of his left leg today which shows persistent thrombus in the left femoral vein. He will need a few more weeks of adequate relation before scheduling elective CABG.  The patient has had an  improvement in his diabetic control after switching to glipizide.his diet has significant improved with less carbs and more vegetables protein and natural oils avoiding processed foods  Past Medical History:  Diagnosis Date  . Diabetes mellitus without complication ([place])   . Hyperlipidemia   . Hypertension   . Obesity   . Sleep apnea     Past Surgical History:  Procedure Laterality Date  . HAND SURGERY    . RIGHT/LEFT HEART CATH AND CORONARY ANGIOGRAPHY N/A 12/20/2016   Procedure: RIGHT/LEFT HEART CATH AND CORONARY ANGIOGRAPHY;  Surgeon: [name], MD;  Location: [place] [facility];  Service: Cardiovascular;  Laterality: N/A;    Family History  Problem Relation Age of Onset  . Cancer Other   . Hypertension Other   . Hyperlipidemia Other   . Heart disease Other   . Cancer Mother   . Heart disease Mother   . Cancer Father   . Heart disease Father   . Lymphoma Sister     Social History Social History   Tobacco Use  . Smoking status: Never Smoker  . Smokeless tobacco: Never Used  Substance Use Topics  .  Alcohol use: No  . Drug use: No    Current Outpatient Medications  Medication Sig Dispense Refill  . acetaminophen (TYLENOL) 500 MG tablet Take 1,000 mg daily as needed by mouth for moderate pain.    Marland Kitchen apixaban (ELIQUIS) 5 MG TABS tablet Take 1 tablet (5 mg total) by mouth 2 (two) times daily. 60 tablet 11  . glipiZIDE (GLUCOTROL XL) 10 MG 24 hr tablet Take 10 mg by mouth every morning.  0  . isosorbide mononitrate (IMDUR) 30 MG 24 hr tablet Take 1 tablet (30 mg total) daily by mouth. 30 tablet 1  . metoprolol succinate (TOPROL-XL) 25 MG 24 hr tablet Take 0.5 tablets (12.5 mg total) by mouth every morning.     No current facility-administered medications for this visit.     No Known Allergies  Review of Systems  No symptoms of respiratory infection Weight stable Walking is improved  BP (!) 162/87 (BP Location: Left Arm, Patient Position: Sitting, Cuff Size: Large)   Pulse 62   Ht 6\' 2"  (1.88 m)   Wt 283 lb (128.4 kg)   SpO2 98%   BMI 36.34 kg/m  Physical Exam      Exam    General- alert and comfortable   Lungs- clear without rales, wheezes   Cor- regular rate and rhythm, no murmur , gallop   Abdomen- soft, non-tender   Extremities - warm, non-tender, minimal edema   Neuro- oriented,  appropriate, no focal weakness   Diagnostic Tests: Ultrasound report reviewed with patient showing residual thrombus in left leg. He is not ready to have CABG.  Impression: Continue Eliquis and schedule surgery in approximate 4 weeks. We discussed the date of surgery is Junior 25, last [place]'s dose January 20 and I will see the patient office on January 23 to make sure he is ready.  Plan:CABG one month.continue anticoagulation for left leg DVT   Len Childs, MD [facility] [phone]

## 2017-01-30 ENCOUNTER — Other Ambulatory Visit: Payer: Self-pay

## 2017-01-30 DIAGNOSIS — I2510 Atherosclerotic heart disease of native coronary artery without angina pectoris: Secondary | ICD-10-CM

## 2017-01-30 NOTE — Progress Notes (Signed)
va

## 2017-02-03 ENCOUNTER — Other Ambulatory Visit: Payer: Self-pay | Admitting: Cardiothoracic Surgery

## 2017-02-13 ENCOUNTER — Encounter: Payer: Self-pay | Admitting: Cardiothoracic Surgery

## 2017-02-18 ENCOUNTER — Other Ambulatory Visit: Payer: Self-pay | Admitting: Nurse Practitioner

## 2017-02-18 ENCOUNTER — Encounter: Payer: Self-pay | Admitting: Cardiovascular Disease

## 2017-02-18 MED ORDER — ISOSORBIDE MONONITRATE ER 30 MG PO TB24: 30.0000 mg | ORAL_TABLET | Freq: Every day | ORAL | 6 refills | Status: DC

## 2017-02-26 ENCOUNTER — Other Ambulatory Visit: Payer: Self-pay | Admitting: Cardiothoracic Surgery

## 2017-02-26 ENCOUNTER — Encounter: Payer: Self-pay | Admitting: Cardiovascular Disease

## 2017-02-26 ENCOUNTER — Ambulatory Visit: Payer: PPO | Admitting: Cardiovascular Disease

## 2017-02-26 VITALS — BP 150/86 | HR 71 | Ht 74.0 in | Wt 294.1 lb

## 2017-02-26 DIAGNOSIS — E782 Mixed hyperlipidemia: Secondary | ICD-10-CM | POA: Diagnosis not present

## 2017-02-26 DIAGNOSIS — I2510 Atherosclerotic heart disease of native coronary artery without angina pectoris: Secondary | ICD-10-CM | POA: Diagnosis not present

## 2017-02-26 NOTE — Patient Instructions (Signed)
Medication Instructions:  Your physician recommends that you continue on your current medications as directed. Please refer to the Current Medication list given to you today.   Labwork: None Ordered   Testing/Procedures: None Ordered   Follow-Up: Your physician recommends that you schedule a follow-up appointment in: 3 months with Dr. Nahser   If you need a refill on your cardiac medications before your next appointment, please call your pharmacy.   Thank you for choosing [facility]! Leodis Alcocer, RN [phone]    

## 2017-02-26 NOTE — Progress Notes (Signed)
Cardiology Office Note:    Date:  02/26/2017   ID:  Travis Blair, DOB 15-May-1950, MRN [id number]  PCP:  Deland Pretty, MD  Cardiologist:  [name], MD    Referring MD: Deland Pretty, MD   No chief complaint on file.   History of Present Illness:    Travis Blair is a 67 y.o. male with a hx of CAD, DM Seen with wife Travis Blair   Has had DOE with walking  -  Was placed on Imdur, Metoprolol  , passed out , Has CKD - Lisinopril was stopped.  Worse for the past month. Has a + stress test,   Cath shows severe CAD.   Has seen Dr. [name]  - recommnended nephrology consult.  Has not heard back from nephrologist   Has had swelling of left leg for 8 months .  No angina ( is diabetic)   February 26, 2017: Seen back today for follow-up of his coronary artery disease, diabetes mellitus, hypertension, and chronic renal insufficiency. He was found to have a DVT several months ago.  He was started on Eliquis. Has seen Dr. Ron Agee and is scheduled for CABG next Friday  BP is a bit high initially but was normal by the end of the visit   Past Medical History:  Diagnosis Date  . Diabetes mellitus without complication ([place])   . Hyperlipidemia   . Hypertension   . Obesity   . Sleep apnea     Past Surgical History:  Procedure Laterality Date  . HAND SURGERY    . RIGHT/LEFT HEART CATH AND CORONARY ANGIOGRAPHY N/A 12/20/2016   Procedure: RIGHT/LEFT HEART CATH AND CORONARY ANGIOGRAPHY;  Surgeon: [name], MD;  Location: [facility];  Service: Cardiovascular;  Laterality: N/A;    Current Medications: Current Meds  Medication Sig  . acetaminophen (TYLENOL) 500 MG tablet Take 1,000 mg daily as needed by mouth for moderate pain.  Marland Kitchen apixaban (ELIQUIS) 5 MG TABS tablet Take 1 tablet (5 mg total) by mouth 2 (two) times daily.  Marland Kitchen aspirin EC 81 MG tablet Take 81 mg by mouth daily.  . diclofenac sodium (VOLTAREN) 1 % GEL Apply 1 application topically 3 (three) times  daily as needed (ankle pain).  Marland Kitchen glipiZIDE (GLUCOTROL XL) 10 MG 24 hr tablet TAKE 1 TABLET BY MOUTH EVERY MORNING  . isosorbide mononitrate (IMDUR) 30 MG 24 hr tablet Take 1 tablet (30 mg total) by mouth daily.  . metoprolol succinate (TOPROL-XL) 25 MG 24 hr tablet Take 0.5 tablets (12.5 mg total) by mouth every morning.     Allergies:   Patient has no known allergies.   Social History   Socioeconomic History  . Marital status: Married    Spouse name: None  . Number of children: None  . Years of education: None  . Highest education level: None  Social Needs  . Financial resource strain: None  . Food insecurity - worry: None  . Food insecurity - inability: None  . Transportation needs - medical: None  . Transportation needs - non-medical: None  Occupational History  . None  Tobacco Use  . Smoking status: Never Smoker  . Smokeless tobacco: Never Used  Substance and Sexual Activity  . Alcohol use: No  . Drug use: No  . Sexual activity: No  Other Topics Concern  . None  Social History Narrative  . None     Family History: The patient's family history includes Cancer in his father, mother,  and other; Heart disease in his father, mother, and other; Hyperlipidemia in his other; Hypertension in his other; Lymphoma in his sister. ROS:   Please see the history of present illness.     All other systems reviewed and are negative.  EKGs/Labs/Other Studies Reviewed:    The following studies were reviewed today: Cath films, previous records.     Recent Labs: 11/17/2016: Hemoglobin 13.9; Platelets 205 01/01/2017: BUN 34; Creatinine, Ser 1.54; Potassium 4.9; Sodium 137  Recent Lipid Panel No results found for: CHOL, TRIG, HDL, CHOLHDL, VLDL, LDLCALC, LDLDIRECT  Physical Exam: Blood pressure (!) 150/86, pulse 71, height 6\' 2"  (1.88 m), weight 294 lb 1.9 oz (133.4 kg), SpO2 98 %.  GEN:  Obese, middle age man.  NAD  HEENT: Normal NECK: No JVD; No carotid bruits LYMPHATICS: No  lymphadenopathy CARDIAC: RR, no murmurs, rubs, gallops RESPIRATORY:  Clear to auscultation without rales, wheezing or rhonchi  ABDOMEN: Soft, non-tender, non-distended MUSCULOSKELETAL:  No edema; No deformity  SKIN: Warm and dry NEUROLOGIC:  Alert and oriented x 3  EKG:     ASSESSMENT:    No diagnosis found. PLAN:    In order of problems listed above:  CAD :   [name] is seen today for follow-up visit.  He is scheduled for bypass surgery next Friday.  He is not having any episodes of angina. Plan on seeing him again in 3 months for follow-up visit.  2.   CKD -he is been cleared by nephrology.  His renal function appears to be stable.  3.  Hyperlipidemia: Continue current medications.   4.   DM -managed by his primary medical doctor.   Medication Adjustments/Labs and Tests Ordered: Current medicines are reviewed at length with the patient today.  Concerns regarding medicines are outlined above.  No orders of the defined types were placed in this encounter.  No orders of the defined types were placed in this encounter.   Signed, [name], MD  02/26/2017 10:18 AM    [place]

## 2017-03-03 NOTE — Pre-Procedure Instructions (Addendum)
Travis Blair  03/03/2017      [id number] [place], [place] - [address] [place] [address] Phone: [phone] Fax: [phone]    Your procedure is scheduled on Jan 25  Report to [facility] at 530 A.M.  Call this number if you have problems the morning of surgery:  [phone]   Remember:  Do not eat food or drink liquids after midnight.  Take these medicines the morning of surgery with A SIP OF WATER Isosorbide Mononitrate(Imdur), Metoprolol succinate (Toprol-XL)  Stop eliquis and aspirin as directed by your Dr.   Stop taking BC's, Goody's, Herbal medications, Aleve, Ibuprofen, Advil, Motrin, Voltaren Gel, Aleve, Vitamins     How to Manage Your Diabetes Before and After Surgery  Why is it important to control my blood sugar before and after surgery? . Improving blood sugar levels before and after surgery helps healing and can limit problems. . A way of improving blood sugar control is eating a healthy diet by: o  Eating less sugar and carbohydrates o  Increasing activity/exercise o  Talking with your doctor about reaching your blood sugar goals . High blood sugars (greater than 180 mg/dL) can raise your risk of infections and slow your recovery, so you will need to focus on controlling your diabetes during the weeks before surgery. . Make sure that the doctor who takes care of your diabetes knows about your planned surgery including the date and location.  How do I manage my blood sugar before surgery? . Check your blood sugar at least 4 times a day, starting 2 days before surgery, to make sure that the level is not too high or low. o Check your blood sugar the morning of your surgery when you wake up and every 2 hours until you get to the Short Stay unit. . If your blood sugar is less than 70 mg/dL, you will need to treat for low blood sugar: o Do not take insulin. o Treat  a low blood sugar (less than 70 mg/dL) with  cup of clear juice (cranberry or apple), 4 glucose tablets, OR glucose gel. Recheck blood sugar in 15 minutes after treatment (to make sure it is greater than 70 mg/dL). If your blood sugar is not greater than 70 mg/dL on recheck, call [phone] o  for further instructions. . Report your blood sugar to the short stay nurse when you get to Short Stay.  . If you are admitted to the hospital after surgery: o Your blood sugar will be checked by the staff and you will probably be given insulin after surgery (instead of oral diabetes medicines) to make sure you have good blood sugar levels. o The goal for blood sugar control after surgery is 80-180 mg/dL.              WHAT DO I DO ABOUT MY DIABETES MEDICATION?   Marland Kitchen Do not take oral diabetes medicines (pills) the morning of surgery. Glipizide (Glucotrol -Xl)     . The day of surgery, do not take other diabetes injectables, including Byetta (exenatide), Bydureon (exenatide ER), Victoza (liraglutide), or Trulicity (dulaglutide).  . If your CBG is greater than 220 mg/dL, you may take  of your sliding scale (correction) dose of insulin.  Other Instructions:          Patient Signature:  Date:   Nurse Signature:  Date:   Reviewed and Endorsed by  [facility] Patient Education Committee, August 2015  Do not wear jewelry, make-up or nail polish.  Do not wear lotions, powders, or perfumes, or deodorant.  Do not shave 48 hours prior to surgery.  Men may shave face and neck.  Do not bring valuables to the hospital.  [facility] is not responsible for any belongings or valuables.  Contacts, dentures or bridgework may not be worn into surgery.  Leave your suitcase in the car.  After surgery it may be brought to your room.  For patients admitted to the hospital, discharge time will be determined by your treatment team.  Patients discharged the day of surgery will not be allowed to drive  home.    Special instructions:  [facility] - Preparing for Surgery  Before surgery, you can play an important role.  Because skin is not sterile, your skin needs to be as free of germs as possible.  You can reduce the number of germs on you skin by washing with CHG (chlorahexidine gluconate) soap before surgery.  CHG is an antiseptic cleaner which kills germs and bonds with the skin to continue killing germs even after washing.  Please DO NOT use if you have an allergy to CHG or antibacterial soaps.  If your skin becomes reddened/irritated stop using the CHG and inform your nurse when you arrive at Short Stay.  Do not shave (including legs and underarms) for at least 48 hours prior to the first CHG shower.  You may shave your face.  Please follow these instructions carefully:   1.  Shower with CHG Soap the night before surgery and the                                morning of Surgery.  2.  If you choose to wash your hair, wash your hair first as usual with your       normal shampoo.  3.  After you shampoo, rinse your hair and body thoroughly to remove the                      Shampoo.  4.  Use CHG as you would any other liquid soap.  You can apply chg directly       to the skin and wash gently with scrungie or a clean washcloth.  5.  Apply the CHG Soap to your body ONLY FROM THE NECK DOWN.        Do not use on open wounds or open sores.  Avoid contact with your eyes,       ears, mouth and genitals (private parts).  Wash genitals (private parts)       with your normal soap.  6.  Wash thoroughly, paying special attention to the area where your surgery        will be performed.  7.  Thoroughly rinse your body with warm water from the neck down.  8.  DO NOT shower/wash with your normal soap after using and rinsing off       the CHG Soap.  9.  Pat yourself dry with a clean towel.            10.  Wear clean pajamas.            11.  Place clean sheets on your bed the night of your first shower and  do not  sleep with pets.  Day of Surgery  Do not apply any lotions/deoderants the morning of surgery.  Please wear clean clothes to the hospital/surgery center.     Please read over the following fact sheets that you were given. Pain Booklet, Coughing and Deep Breathing, MRSA Information and Surgical Site Infection Prevention, incentive spirometry

## 2017-03-04 ENCOUNTER — Ambulatory Visit (HOSPITAL_COMMUNITY)
Admission: RE | Admit: 2017-03-04 | Discharge: 2017-03-04 | Disposition: A | Discharge status: Home / Self Care | Payer: PPO | Source: Ambulatory Visit | Attending: Cardiothoracic Surgery | Admitting: Cardiothoracic Surgery

## 2017-03-04 ENCOUNTER — Telehealth: Payer: Self-pay

## 2017-03-04 ENCOUNTER — Encounter (HOSPITAL_COMMUNITY)
Admission: RE | Admit: 2017-03-04 | Discharge: 2017-03-04 | Disposition: A | Discharge status: Home / Self Care | Payer: PPO | Source: Ambulatory Visit | Attending: Cardiothoracic Surgery | Admitting: Cardiothoracic Surgery

## 2017-03-04 ENCOUNTER — Ambulatory Visit (HOSPITAL_BASED_OUTPATIENT_CLINIC_OR_DEPARTMENT_OTHER)
Admission: RE | Admit: 2017-03-04 | Discharge: 2017-03-04 | Disposition: A | Discharge status: Home / Self Care | Payer: PPO | Source: Ambulatory Visit | Attending: Cardiothoracic Surgery | Admitting: Cardiothoracic Surgery

## 2017-03-04 ENCOUNTER — Other Ambulatory Visit: Payer: Self-pay

## 2017-03-04 ENCOUNTER — Encounter (HOSPITAL_COMMUNITY): Payer: PPO

## 2017-03-04 ENCOUNTER — Encounter (HOSPITAL_COMMUNITY): Payer: Self-pay

## 2017-03-04 DIAGNOSIS — Z7982 Long term (current) use of aspirin: Secondary | ICD-10-CM | POA: Diagnosis not present

## 2017-03-04 DIAGNOSIS — I454 Nonspecific intraventricular block: Secondary | ICD-10-CM | POA: Diagnosis not present

## 2017-03-04 DIAGNOSIS — Z792 Long term (current) use of antibiotics: Secondary | ICD-10-CM | POA: Diagnosis not present

## 2017-03-04 DIAGNOSIS — R3911 Hesitancy of micturition: Secondary | ICD-10-CM | POA: Diagnosis not present

## 2017-03-04 DIAGNOSIS — I2510 Atherosclerotic heart disease of native coronary artery without angina pectoris: Secondary | ICD-10-CM

## 2017-03-04 DIAGNOSIS — E1122 Type 2 diabetes mellitus with diabetic chronic kidney disease: Secondary | ICD-10-CM | POA: Diagnosis present

## 2017-03-04 DIAGNOSIS — Z01810 Encounter for preprocedural cardiovascular examination: Secondary | ICD-10-CM

## 2017-03-04 DIAGNOSIS — G4733 Obstructive sleep apnea (adult) (pediatric): Secondary | ICD-10-CM | POA: Diagnosis not present

## 2017-03-04 DIAGNOSIS — Z79899 Other long term (current) drug therapy: Secondary | ICD-10-CM | POA: Diagnosis not present

## 2017-03-04 DIAGNOSIS — J9811 Atelectasis: Secondary | ICD-10-CM | POA: Diagnosis not present

## 2017-03-04 DIAGNOSIS — G4730 Sleep apnea, unspecified: Secondary | ICD-10-CM | POA: Diagnosis present

## 2017-03-04 DIAGNOSIS — J90 Pleural effusion, not elsewhere classified: Secondary | ICD-10-CM | POA: Diagnosis present

## 2017-03-04 DIAGNOSIS — I252 Old myocardial infarction: Secondary | ICD-10-CM | POA: Diagnosis not present

## 2017-03-04 DIAGNOSIS — R600 Localized edema: Secondary | ICD-10-CM | POA: Diagnosis present

## 2017-03-04 DIAGNOSIS — E669 Obesity, unspecified: Secondary | ICD-10-CM | POA: Diagnosis present

## 2017-03-04 DIAGNOSIS — D688 Other specified coagulation defects: Secondary | ICD-10-CM | POA: Diagnosis not present

## 2017-03-04 DIAGNOSIS — I25119 Atherosclerotic heart disease of native coronary artery with unspecified angina pectoris: Secondary | ICD-10-CM | POA: Diagnosis present

## 2017-03-04 DIAGNOSIS — Z01818 Encounter for other preprocedural examination: Secondary | ICD-10-CM

## 2017-03-04 DIAGNOSIS — Z6837 Body mass index (BMI) 37.0-37.9, adult: Secondary | ICD-10-CM | POA: Diagnosis not present

## 2017-03-04 DIAGNOSIS — I10 Essential (primary) hypertension: Secondary | ICD-10-CM | POA: Diagnosis not present

## 2017-03-04 DIAGNOSIS — R9431 Abnormal electrocardiogram [ECG] [EKG]: Secondary | ICD-10-CM | POA: Insufficient documentation

## 2017-03-04 DIAGNOSIS — I081 Rheumatic disorders of both mitral and tricuspid valves: Secondary | ICD-10-CM | POA: Diagnosis not present

## 2017-03-04 DIAGNOSIS — Z7984 Long term (current) use of oral hypoglycemic drugs: Secondary | ICD-10-CM | POA: Diagnosis not present

## 2017-03-04 DIAGNOSIS — Z01812 Encounter for preprocedural laboratory examination: Secondary | ICD-10-CM | POA: Insufficient documentation

## 2017-03-04 DIAGNOSIS — E785 Hyperlipidemia, unspecified: Secondary | ICD-10-CM | POA: Diagnosis present

## 2017-03-04 DIAGNOSIS — Z951 Presence of aortocoronary bypass graft: Secondary | ICD-10-CM | POA: Diagnosis not present

## 2017-03-04 DIAGNOSIS — N3000 Acute cystitis without hematuria: Secondary | ICD-10-CM

## 2017-03-04 DIAGNOSIS — I4891 Unspecified atrial fibrillation: Secondary | ICD-10-CM | POA: Diagnosis present

## 2017-03-04 DIAGNOSIS — D62 Acute posthemorrhagic anemia: Secondary | ICD-10-CM | POA: Diagnosis not present

## 2017-03-04 DIAGNOSIS — E8770 Fluid overload, unspecified: Secondary | ICD-10-CM | POA: Diagnosis not present

## 2017-03-04 DIAGNOSIS — R110 Nausea: Secondary | ICD-10-CM | POA: Diagnosis not present

## 2017-03-04 DIAGNOSIS — I82592 Chronic embolism and thrombosis of other specified deep vein of left lower extremity: Secondary | ICD-10-CM | POA: Diagnosis not present

## 2017-03-04 DIAGNOSIS — E1165 Type 2 diabetes mellitus with hyperglycemia: Secondary | ICD-10-CM | POA: Diagnosis not present

## 2017-03-04 DIAGNOSIS — Z7901 Long term (current) use of anticoagulants: Secondary | ICD-10-CM | POA: Diagnosis not present

## 2017-03-04 DIAGNOSIS — Z8249 Family history of ischemic heart disease and other diseases of the circulatory system: Secondary | ICD-10-CM | POA: Diagnosis not present

## 2017-03-04 DIAGNOSIS — Z86718 Personal history of other venous thrombosis and embolism: Secondary | ICD-10-CM | POA: Diagnosis not present

## 2017-03-04 HISTORY — DX: Chronic kidney disease, unspecified: N18.9

## 2017-03-04 HISTORY — DX: Acute myocardial infarction, unspecified: I21.9

## 2017-03-04 HISTORY — DX: Pneumonia, unspecified organism: J18.9

## 2017-03-04 HISTORY — DX: Atherosclerotic heart disease of native coronary artery without angina pectoris: I25.10

## 2017-03-04 HISTORY — DX: Unspecified asthma, uncomplicated: J45.909

## 2017-03-04 LAB — TYPE AND SCREEN
ABO/RH(D): A POS
Antibody Screen: NEGATIVE

## 2017-03-04 LAB — URINALYSIS, ROUTINE W REFLEX MICROSCOPIC
Bilirubin Urine: NEGATIVE
Glucose, UA: 50 mg/dL — AB
Hgb urine dipstick: NEGATIVE
Ketones, ur: NEGATIVE mg/dL
Leukocytes, UA: NEGATIVE
Nitrite: NEGATIVE
Protein, ur: 100 mg/dL — AB
Specific Gravity, Urine: 1.014 (ref 1.005–1.030)
Squamous Epithelial / LPF: NONE SEEN
pH: 5 (ref 5.0–8.0)

## 2017-03-04 LAB — COMPREHENSIVE METABOLIC PANEL
ALT: 17 U/L (ref 17–63)
AST: 19 U/L (ref 15–41)
Albumin: 3.6 g/dL (ref 3.5–5.0)
Alkaline Phosphatase: 50 U/L (ref 38–126)
Anion gap: 13 (ref 5–15)
BUN: 32 mg/dL — ABNORMAL HIGH (ref 6–20)
CO2: 18 mmol/L — ABNORMAL LOW (ref 22–32)
Calcium: 9 mg/dL (ref 8.9–10.3)
Chloride: 105 mmol/L (ref 101–111)
Creatinine, Ser: 1.28 mg/dL — ABNORMAL HIGH (ref 0.61–1.24)
GFR calc Af Amer: >60 mL/min (ref >60)
GFR calc non Af Amer: 57 mL/min — ABNORMAL LOW (ref >60)
Glucose, Bld: 122 mg/dL — ABNORMAL HIGH (ref 65–99)
Potassium: 4.2 mmol/L (ref 3.5–5.1)
Sodium: 136 mmol/L (ref 135–145)
Total Bilirubin: 0.7 mg/dL (ref 0.3–1.2)
Total Protein: 6.7 g/dL (ref 6.5–8.1)

## 2017-03-04 LAB — CBC
HCT: 40.4 % (ref 39.0–52.0)
Hemoglobin: 13.8 g/dL (ref 13.0–17.0)
MCH: 31.2 pg (ref 26.0–34.0)
MCHC: 34.2 g/dL (ref 30.0–36.0)
MCV: 91.2 fL (ref 78.0–100.0)
Platelets: 225 10*3/uL (ref 150–400)
RBC: 4.43 MIL/uL (ref 4.22–5.81)
RDW: 13.1 % (ref 11.5–15.5)
WBC: 7.2 10*3/uL (ref 4.0–10.5)

## 2017-03-04 LAB — SURGICAL PCR SCREEN
MRSA, PCR: NEGATIVE
Staphylococcus aureus: NEGATIVE

## 2017-03-04 LAB — BLOOD GAS, ARTERIAL
Acid-base deficit: 2.3 mmol/L — ABNORMAL HIGH (ref 0.0–2.0)
Bicarbonate: 21.8 mmol/L (ref 20.0–28.0)
Drawn by: 449841
FIO2: 21
O2 Saturation: 95.7 %
Patient temperature: 98.6
pCO2 arterial: 36.1 mmHg (ref 32.0–48.0)
pH, Arterial: 7.398 (ref 7.350–7.450)
pO2, Arterial: 81 mmHg — ABNORMAL LOW (ref 83.0–108.0)

## 2017-03-04 LAB — HEMOGLOBIN A1C
Hgb A1c MFr Bld: 7.2 % — ABNORMAL HIGH (ref 4.8–5.6)
Mean Plasma Glucose: 159.94 mg/dL

## 2017-03-04 LAB — PROTIME-INR
INR: 0.97
Prothrombin Time: 12.8 seconds (ref 11.4–15.2)

## 2017-03-04 LAB — APTT: aPTT: 26 seconds (ref 24–36)

## 2017-03-04 LAB — GLUCOSE, CAPILLARY: Glucose-Capillary: 131 mg/dL — ABNORMAL HIGH (ref 65–99)

## 2017-03-04 LAB — ABO/RH: ABO/RH(D): A POS

## 2017-03-04 MED ORDER — CIPROFLOXACIN HCL 500 MG PO TABS: 500.0000 mg | ORAL_TABLET | Freq: Two times a day (BID) | ORAL | 0 refills | Status: DC

## 2017-03-04 NOTE — Progress Notes (Signed)
PCP is Dr. Deland Pretty Kidney Dr -Dr [name] Heart Dr -Dr Acie Fredrickson Denies chest pain, Fever, or cough. Reports fasting CBG's 110-120 Eliquis last dose 03-01-17 States he will ask Dr [name] tomorrow what to do about his aspirin. Instructed to bring CPAP mask day of surgery. Pulmonary function is scheduled for tomorrow.

## 2017-03-04 NOTE — Progress Notes (Signed)
Message left with Thurmond Butts to inform her of UA results rare bacteria.

## 2017-03-04 NOTE — Telephone Encounter (Signed)
RX for Cipro 500 mg BID called to pharm

## 2017-03-04 NOTE — Progress Notes (Signed)
Carotid duplex prelim: 1-39% ICA stenosis. UE Doppler: bilateral palmar arch normal. ABI: normal waveforms, ABI falsely elevated most likely due to calcified vessels. Landry Mellow, RDMS, RVT

## 2017-03-05 ENCOUNTER — Ambulatory Visit (HOSPITAL_COMMUNITY)
Admission: RE | Admit: 2017-03-05 | Discharge: 2017-03-05 | Disposition: A | Discharge status: Home / Self Care | Payer: PPO | Source: Ambulatory Visit | Attending: Cardiothoracic Surgery | Admitting: Cardiothoracic Surgery

## 2017-03-05 ENCOUNTER — Encounter: Payer: Self-pay | Admitting: Cardiothoracic Surgery

## 2017-03-05 ENCOUNTER — Ambulatory Visit
Admission: RE | Admit: 2017-03-05 | Discharge: 2017-03-05 | Disposition: A | Discharge status: Home / Self Care | Payer: PPO | Source: Ambulatory Visit | Attending: Cardiothoracic Surgery | Admitting: Cardiothoracic Surgery

## 2017-03-05 ENCOUNTER — Ambulatory Visit: Payer: PPO | Admitting: Cardiothoracic Surgery

## 2017-03-05 ENCOUNTER — Other Ambulatory Visit: Payer: Self-pay | Admitting: Cardiothoracic Surgery

## 2017-03-05 ENCOUNTER — Other Ambulatory Visit: Payer: Self-pay | Admitting: *Deleted

## 2017-03-05 VITALS — BP 113/68 | HR 78 | Resp 20 | Ht 74.0 in

## 2017-03-05 DIAGNOSIS — R600 Localized edema: Secondary | ICD-10-CM | POA: Diagnosis not present

## 2017-03-05 DIAGNOSIS — I2510 Atherosclerotic heart disease of native coronary artery without angina pectoris: Secondary | ICD-10-CM | POA: Diagnosis not present

## 2017-03-05 DIAGNOSIS — R52 Pain, unspecified: Secondary | ICD-10-CM

## 2017-03-05 DIAGNOSIS — I82592 Chronic embolism and thrombosis of other specified deep vein of left lower extremity: Secondary | ICD-10-CM | POA: Diagnosis not present

## 2017-03-05 DIAGNOSIS — N3000 Acute cystitis without hematuria: Secondary | ICD-10-CM | POA: Diagnosis not present

## 2017-03-05 LAB — PULMONARY FUNCTION TEST
DL/VA % pred: 99 %
DL/VA: 4.8 ml/min/mmHg/L
DLCO cor % pred: 93 %
DLCO cor: 35.1 ml/min/mmHg
DLCO unc % pred: 90 %
DLCO unc: 34.28 ml/min/mmHg
FEF 25-75 Post: 4.73 L/sec
FEF 25-75 Pre: 4.35 L/sec
FEF2575-%Change-Post: 8 %
FEF2575-%Pred-Post: 153 %
FEF2575-%Pred-Pre: 141 %
FEV1-%Change-Post: 1 %
FEV1-%Pred-Post: 110 %
FEV1-%Pred-Pre: 109 %
FEV1-Post: 4.36 L
FEV1-Pre: 4.3 L
FEV1FVC-%Change-Post: 2 %
FEV1FVC-%Pred-Pre: 109 %
FEV6-%Change-Post: -1 %
FEV6-%Pred-Post: 101 %
FEV6-%Pred-Pre: 103 %
FEV6-Post: 5.12 L
FEV6-Pre: 5.19 L
FEV6FVC-%Change-Post: 0 %
FEV6FVC-%Pred-Post: 104 %
FEV6FVC-%Pred-Pre: 104 %
FVC-%Change-Post: -1 %
FVC-%Pred-Post: 97 %
FVC-%Pred-Pre: 98 %
FVC-Post: 5.14 L
FVC-Pre: 5.22 L
Post FEV1/FVC ratio: 85 %
Post FEV6/FVC ratio: 100 %
Pre FEV1/FVC ratio: 82 %
Pre FEV6/FVC Ratio: 100 %
RV % pred: 84 %
RV: 2.17 L
TLC % pred: 96 %
TLC: 7.52 L

## 2017-03-05 MED ORDER — ALBUTEROL SULFATE (2.5 MG/3ML) 0.083% IN NEBU
2.5000 mg | INHALATION_SOLUTION | Freq: Once | RESPIRATORY_TRACT | Status: AC
  Administered 2017-03-05: 2.5 mg via RESPIRATORY_TRACT

## 2017-03-05 MED ORDER — CIPROFLOXACIN HCL 500 MG PO TABS: 500.0000 mg | ORAL_TABLET | Freq: Two times a day (BID) | ORAL | 0 refills | Status: DC

## 2017-03-05 MED ORDER — CIPROFLOXACIN HCL 500 MG PO TABS: 500.0000 mg | ORAL_TABLET | Freq: Two times a day (BID) | ORAL | 2 refills | Status: DC

## 2017-03-05 NOTE — Progress Notes (Signed)
PCP is Deland Pretty, MD Referring Provider is [name], MD  Chief Complaint  Patient presents with  . Coronary Artery Disease    pre- surgery visit    HPI: Patient presents for reevaluation and discussion of plan multivessel CABG. The patient is 290 pound diabetic recently recently diagnosed with severe three-vessel CAD. He has had symptoms of increasing fatigue and decrease in exercise tolerance. He was prepared for multivessel bypass grafting but developed left leg swelling and was found have DVT. He is placed on oral anticoagulation with eliquis over month ago and now returns for reevaluation. He still has some persistent swelling left lower extremity but is able to walk without pain. Follow-up ultrasound of his legs shows almost complete resolution of the DVT with some small thrombus in the left leg and a branch of the popliteal vein otherwise the deep venous system is without thrombus. Because the patient is having increasing symptoms of dyspnea on exertion and feeling poorly I feel that we should proceed with multivessel bypass grafting as he has severe three-vessel CAD including occlusion of the circumflex and 90% stenosis of the LAD and RCA. The patient has stopped taking his anticoagulant in preparation for surgery January 25.   Patient has history of sleep apnea and wears CPAP mask routinely. Preoperative PFTs and ABG were satisfactory.  Preoperative echo shows no significant valvular disease with fairly well preserved LV and RV function. Right ventricle is dilated. PA pressures 42/15 CVP of 8  Patient has moderate renal insufficiency chronic kidney disease stage III and was evaluated by renal and cleared for surgery.   Patient's diabetes is treated with Glucotrol. He has made a significant improvement in his diet which has improved his glucose control.   Patient's preoperative Doppler showed no significant carotid disease and he appears to be optimized for surgery with  bypass grafts the LAD and RCA and distal circumflex.  Past Medical History:  Diagnosis Date  . Asthma    as a child  . Chronic kidney disease    Sees Dr [name]  . Coronary artery disease   . Diabetes mellitus without complication ([place])   . Hyperlipidemia   . Hypertension   . Myocardial infarction ([place])   . Obesity   . Pneumonia 2018  . Sleep apnea     Past Surgical History:  Procedure Laterality Date  . COLONOSCOPY    . HAND SURGERY    . RIGHT/LEFT HEART CATH AND CORONARY ANGIOGRAPHY N/A 12/20/2016   Procedure: RIGHT/LEFT HEART CATH AND CORONARY ANGIOGRAPHY;  Surgeon: [name], MD;  Location: [facility];  Service: Cardiovascular;  Laterality: N/A;  . TONSILLECTOMY      Family History  Problem Relation Age of Onset  . Cancer Other   . Hypertension Other   . Hyperlipidemia Other   . Heart disease Other   . Cancer Mother   . Heart disease Mother   . Cancer Father   . Heart disease Father   . Lymphoma Sister     Social History Social History   Tobacco Use  . Smoking status: Never Smoker  . Smokeless tobacco: Never Used  Substance Use Topics  . Alcohol use: No  . Drug use: No    Current Outpatient Medications  Medication Sig Dispense Refill  . acetaminophen (TYLENOL) 500 MG tablet Take 1,000 mg daily as needed by mouth for moderate pain.    Marland Kitchen aspirin EC 81 MG tablet Take 81 mg by mouth daily.    Marland Kitchen  ciprofloxacin (CIPRO) 500 MG tablet Take 1 tablet (500 mg total) by mouth 2 (two) times daily. 3 tablet 0  . diclofenac sodium (VOLTAREN) 1 % GEL Apply 1 application topically 3 (three) times daily as needed (ankle pain).    Marland Kitchen glipiZIDE (GLUCOTROL XL) 10 MG 24 hr tablet TAKE 1 TABLET BY MOUTH EVERY MORNING 90 tablet 0  . isosorbide mononitrate (IMDUR) 30 MG 24 hr tablet Take 1 tablet (30 mg total) by mouth daily. 30 tablet 6  . metoprolol succinate (TOPROL-XL) 25 MG 24 hr tablet Take 0.5 tablets (12.5 mg total) by mouth every morning.    Marland Kitchen apixaban  (ELIQUIS) 5 MG TABS tablet Take 1 tablet (5 mg total) by mouth 2 (two) times daily. (Patient not taking: Reported on 03/05/2017) 60 tablet 11  . ciprofloxacin (CIPRO) 500 MG tablet Take 1 tablet (500 mg total) by mouth 2 (two) times daily. 3 tablet 2   No current facility-administered medications for this visit.     No Known Allergies  Review of Systems        Review of Systems :  [ y ] = yes, [  ] = no        General :  Weight gain [   ]    Weight loss  [   ]  Fatigue [ yes ]  Fever [  ]  Chills  [  ]                                Weakness  [  ]           HEENT    Headache [  ]  Dizziness [  ]  Blurred vision [  ] Glaucoma  [  ]                          Nosebleeds [  ] Painful or loose teeth [  ]        Cardiac :  Chest pain/ pressure [  ]  Resting SOB [  ] exertional SOB [place]  ]                        Orthopnea [  ]  Pedal edema  [ yes left leg ]  Palpitations [place]  ] Syncope/presyncope [ ]                         Paroxysmal nocturnal dyspnea [  ]         Pulmonary : cough [  ]  wheezing [  ]  Hemoptysis [  ] Sputum [  ] Snoring [  ]                              Pneumothorax [  ]  Sleep apnea [place]er  ]        GI : Vomiting [  ]  Dysphagia [  ]  Melena  [  ]  Abdominal pain [  ] BRBPR [  ]              Heart burn [  ]  Constipation [  ] Diarrhea  [  ] Colonoscopy [   ]        GU : Hematuria [  ]  Dysuria [  ]  Nocturia [  ] UTI's [ bacteria and white cells on UA-on preoperative Cipro ]        Vascular : Claudication [  ]  Rest pain [  ]  DVT [ yes left leg being treated since mid December ] Vein stripping [  ] leg ulcers [  ]                          TIA [  ] Stroke [  ]  Varicose veins [  ]        NEURO :  Headaches  [  ] Seizures [  ] Vision changes [  ] Paresthesias [  ]                                       Seizures [  ]        Musculoskeletal :  Arthritis [  ] Gout  [  ]  Back pain [  ]  Joint pain [  ]        Skin :  Rash [  ]  Melanoma [  ] Sores [  ]        Heme :  Bleeding problems [  ]Clotting Disorders [  ] Anemia [  ]Blood Transfusion [ ]         Endocrine : Diabetes [ yes ] Heat or Cold intolerance [  ] Polyuria [  ]excessive thirst [ ]         Psych : Depression [  ]  Anxiety [  ]  Psych hospitalizations [  ] Memory change [  ]                                               BP 113/68   Pulse 78   Resp 20   Ht 6\' 2"  (1.88 m)   SpO2 98% Comment: RA  BMI 37.58 kg/m  Physical Exam      Physical Exam  General: Obese middle-aged male no acute distress accompanied by wife HEENT: Normocephalic pupils equal , dentition adequate Neck: Supple without JVD, adenopathy, or bruit Chest: Clear to auscultation, symmetrical breath sounds, no rhonchi, no tenderness             or deformity Cardiovascular: Regular rate and rhythm, no murmur, no gallop, peripheral pulses             palpable in all extremities Abdomen:  Soft, nontender, no palpable mass or organomegaly Extremities: Warm, well-perfused, no clubbing or cyanosis. Left below the knee edema without  tenderness,              no venous stasis changes of the legs Rectal/GU: Deferred Neuro: Grossly non--focal and symmetrical throughout Skin: Clean and dry without rash or ulceration   Diagnostic Tests: Coronary arteriograms personally reviewed showing severe critical disease with reserved LV function  Impression: Patient with symptomatic coronary disease, DVT of the left leg now treated with oral anticoagulation for 6 weeks with significant improvement of the DVT by ultrasound today. We'll proceed with multivessel CABG January 25.  Plan:Benefits indications risks and expected recovery discussed with patient and wife.   Len Childs, MD [facility] [phone]

## 2017-03-05 NOTE — Progress Notes (Unsigned)
1 

## 2017-03-06 ENCOUNTER — Encounter (HOSPITAL_COMMUNITY): Payer: Self-pay | Admitting: Certified Registered Nurse Anesthetist

## 2017-03-06 MED ORDER — DEXTROSE 5 % IV SOLN
0.0000 ug/min | INTRAVENOUS | Status: DC
  Filled 2017-03-06: qty 4

## 2017-03-06 MED ORDER — TRANEXAMIC ACID (OHS) PUMP PRIME SOLUTION
2.0000 mg/kg | INTRAVENOUS | Status: DC
  Filled 2017-03-06: qty 2.66

## 2017-03-06 MED ORDER — SODIUM CHLORIDE 0.9 % IV SOLN
30.0000 ug/min | INTRAVENOUS | Status: AC
  Administered 2017-03-07: 20 ug/min via INTRAVENOUS
  Filled 2017-03-06: qty 2

## 2017-03-06 MED ORDER — POTASSIUM CHLORIDE 2 MEQ/ML IV SOLN
80.0000 meq | INTRAVENOUS | Status: DC
Start: 2017-03-07 — End: 2017-03-07
  Filled 2017-03-06: qty 40

## 2017-03-06 MED ORDER — DEXTROSE 5 % IV SOLN
750.0000 mg | INTRAVENOUS | Status: DC
  Filled 2017-03-06: qty 750

## 2017-03-06 MED ORDER — MAGNESIUM SULFATE 50 % IJ SOLN
40.0000 meq | INTRAMUSCULAR | Status: DC
  Filled 2017-03-06: qty 9.85

## 2017-03-06 MED ORDER — PAPAVERINE HCL 30 MG/ML IJ SOLN
INTRAMUSCULAR | Status: AC
  Administered 2017-03-07: 500 mL
  Filled 2017-03-06: qty 2.5

## 2017-03-06 MED ORDER — SODIUM CHLORIDE 0.9 % IV SOLN
INTRAVENOUS | Status: AC
  Administered 2017-03-07: 1.5 [IU]/h via INTRAVENOUS
  Filled 2017-03-06: qty 1

## 2017-03-06 MED ORDER — VANCOMYCIN HCL 10 G IV SOLR
1500.0000 mg | INTRAVENOUS | Status: AC
  Administered 2017-03-07: 1500 mg via INTRAVENOUS
  Filled 2017-03-06: qty 1500

## 2017-03-06 MED ORDER — METOPROLOL TARTRATE 12.5 MG HALF TABLET: 12.5000 mg | ORAL_TABLET | Freq: Once | ORAL | Status: DC

## 2017-03-06 MED ORDER — MILRINONE LACTATE IN DEXTROSE 20-5 MG/100ML-% IV SOLN
0.1250 ug/kg/min | INTRAVENOUS | Status: AC
  Administered 2017-03-07: .25 ug/kg/min via INTRAVENOUS
  Filled 2017-03-06: qty 100

## 2017-03-06 MED ORDER — NITROGLYCERIN IN D5W 200-5 MCG/ML-% IV SOLN
2.0000 ug/min | INTRAVENOUS | Status: AC
  Administered 2017-03-07: 16.67 ug/min via INTRAVENOUS
  Filled 2017-03-06: qty 250

## 2017-03-06 MED ORDER — TRANEXAMIC ACID 1000 MG/10ML IV SOLN
1.5000 mg/kg/h | INTRAVENOUS | Status: AC
  Administered 2017-03-07: 1.5 mg/kg/h via INTRAVENOUS
  Administered 2017-03-07: 12:00:00 via INTRAVENOUS
  Filled 2017-03-06: qty 25

## 2017-03-06 MED ORDER — CHLORHEXIDINE GLUCONATE 0.12 % MT SOLN
15.0000 mL | Freq: Once | OROMUCOSAL | Status: AC
  Administered 2017-03-07: 15 mL via OROMUCOSAL
  Filled 2017-03-06: qty 15

## 2017-03-06 MED ORDER — TRANEXAMIC ACID (OHS) BOLUS VIA INFUSION
15.0000 mg/kg | INTRAVENOUS | Status: AC
  Administered 2017-03-07: 1992 mg via INTRAVENOUS
  Filled 2017-03-06: qty 1992

## 2017-03-06 MED ORDER — DOPAMINE-DEXTROSE 3.2-5 MG/ML-% IV SOLN
0.0000 ug/kg/min | INTRAVENOUS | Status: DC
  Filled 2017-03-06: qty 250

## 2017-03-06 MED ORDER — DEXMEDETOMIDINE HCL IN NACL 400 MCG/100ML IV SOLN
0.1000 ug/kg/h | INTRAVENOUS | Status: AC
  Administered 2017-03-07: 0.7 ug/kg/h via INTRAVENOUS
  Filled 2017-03-06: qty 100

## 2017-03-06 MED ORDER — DEXTROSE 5 % IV SOLN
1.5000 g | INTRAVENOUS | Status: AC
  Administered 2017-03-07: 1.5 g via INTRAVENOUS
  Filled 2017-03-06: qty 1.5

## 2017-03-06 MED ORDER — SODIUM CHLORIDE 0.9 % IV SOLN
INTRAVENOUS | Status: DC
  Filled 2017-03-06: qty 30

## 2017-03-06 NOTE — Anesthesia Preprocedure Evaluation (Addendum)
Anesthesia Evaluation  Patient identified by MRN, date of birth, ID band Patient awake    Reviewed: Allergy & Precautions, H&P , NPO status , Patient's Chart, lab work & pertinent test results  Airway Mallampati: II  TM Distance: >3 FB Neck ROM: Full    Dental  (+) Teeth Intact, Dental Advisory Given   Pulmonary asthma , sleep apnea ,    breath sounds clear to auscultation       Cardiovascular Exercise Tolerance: Good hypertension, Pt. on medications and Pt. on home beta blockers + CAD   Rhythm:Regular Rate:Normal     Neuro/Psych negative neurological ROS  negative psych ROS   GI/Hepatic negative GI ROS, Neg liver ROS,   Endo/Other  diabetes, Type 2, Oral Hypoglycemic AgentsMorbid obesity  Renal/GU Renal InsufficiencyRenal disease  negative genitourinary   Musculoskeletal   Abdominal   Peds  Hematology negative hematology ROS (+)   Anesthesia Other Findings   Reproductive/Obstetrics negative OB ROS                           Anesthesia Physical Anesthesia Plan  ASA: IV  Anesthesia Plan: General   Post-op Pain Management:    Induction: Intravenous  PONV Risk Score and Plan: 3 and Midazolam and Treatment may vary due to age or medical condition  Airway Management Planned: Oral ETT  Additional Equipment: Arterial line, CVP, PA Cath, TEE and Ultrasound Guidance Line Placement  Intra-op Plan:   Post-operative Plan: Post-operative intubation/ventilation  Informed Consent: I have reviewed the patients History and Physical, chart, labs and discussed the procedure including the risks, benefits and alternatives for the proposed anesthesia with the patient or authorized representative who has indicated his/her understanding and acceptance.   Dental advisory given  Plan Discussed with: CRNA  Anesthesia Plan Comments:         Anesthesia Quick Evaluation

## 2017-03-07 ENCOUNTER — Ambulatory Visit (HOSPITAL_COMMUNITY): Payer: PPO

## 2017-03-07 ENCOUNTER — Encounter (HOSPITAL_COMMUNITY): Payer: Self-pay | Admitting: *Deleted

## 2017-03-07 ENCOUNTER — Inpatient Hospital Stay (HOSPITAL_COMMUNITY)
Admission: RE | Admit: 2017-03-07 | Discharge: 2017-03-13 | DRG: 236 | Disposition: A | Discharge status: Home Health | Payer: PPO | Source: Ambulatory Visit | Attending: Cardiothoracic Surgery | Admitting: Cardiothoracic Surgery

## 2017-03-07 ENCOUNTER — Inpatient Hospital Stay (HOSPITAL_COMMUNITY): Payer: PPO | Admitting: Certified Registered Nurse Anesthetist

## 2017-03-07 ENCOUNTER — Inpatient Hospital Stay (HOSPITAL_COMMUNITY)
Admission: RE | Disposition: A | Discharge status: Home Health | Payer: Self-pay | Source: Ambulatory Visit | Attending: Cardiothoracic Surgery

## 2017-03-07 ENCOUNTER — Inpatient Hospital Stay (HOSPITAL_COMMUNITY): Payer: PPO

## 2017-03-07 DIAGNOSIS — K3184 Gastroparesis: Secondary | ICD-10-CM | POA: Diagnosis present

## 2017-03-07 DIAGNOSIS — D62 Acute posthemorrhagic anemia: Secondary | ICD-10-CM | POA: Diagnosis not present

## 2017-03-07 DIAGNOSIS — I2510 Atherosclerotic heart disease of native coronary artery without angina pectoris: Secondary | ICD-10-CM

## 2017-03-07 DIAGNOSIS — G4730 Sleep apnea, unspecified: Secondary | ICD-10-CM | POA: Diagnosis present

## 2017-03-07 DIAGNOSIS — Z792 Long term (current) use of antibiotics: Secondary | ICD-10-CM | POA: Diagnosis not present

## 2017-03-07 DIAGNOSIS — I454 Nonspecific intraventricular block: Secondary | ICD-10-CM | POA: Diagnosis not present

## 2017-03-07 DIAGNOSIS — E8770 Fluid overload, unspecified: Secondary | ICD-10-CM | POA: Diagnosis not present

## 2017-03-07 DIAGNOSIS — J9811 Atelectasis: Secondary | ICD-10-CM | POA: Diagnosis not present

## 2017-03-07 DIAGNOSIS — Z6837 Body mass index (BMI) 37.0-37.9, adult: Secondary | ICD-10-CM | POA: Diagnosis not present

## 2017-03-07 DIAGNOSIS — R110 Nausea: Secondary | ICD-10-CM | POA: Diagnosis not present

## 2017-03-07 DIAGNOSIS — Z807 Family history of other malignant neoplasms of lymphoid, hematopoietic and related tissues: Secondary | ICD-10-CM

## 2017-03-07 DIAGNOSIS — Z7984 Long term (current) use of oral hypoglycemic drugs: Secondary | ICD-10-CM | POA: Diagnosis not present

## 2017-03-07 DIAGNOSIS — R600 Localized edema: Secondary | ICD-10-CM | POA: Diagnosis present

## 2017-03-07 DIAGNOSIS — I4891 Unspecified atrial fibrillation: Secondary | ICD-10-CM | POA: Diagnosis present

## 2017-03-07 DIAGNOSIS — E1143 Type 2 diabetes mellitus with diabetic autonomic (poly)neuropathy: Secondary | ICD-10-CM | POA: Diagnosis present

## 2017-03-07 DIAGNOSIS — R3911 Hesitancy of micturition: Secondary | ICD-10-CM | POA: Diagnosis not present

## 2017-03-07 DIAGNOSIS — I25119 Atherosclerotic heart disease of native coronary artery with unspecified angina pectoris: Secondary | ICD-10-CM | POA: Diagnosis present

## 2017-03-07 DIAGNOSIS — I10 Essential (primary) hypertension: Secondary | ICD-10-CM | POA: Diagnosis present

## 2017-03-07 DIAGNOSIS — Z951 Presence of aortocoronary bypass graft: Secondary | ICD-10-CM

## 2017-03-07 DIAGNOSIS — E1122 Type 2 diabetes mellitus with diabetic chronic kidney disease: Secondary | ICD-10-CM | POA: Diagnosis present

## 2017-03-07 DIAGNOSIS — D688 Other specified coagulation defects: Secondary | ICD-10-CM | POA: Diagnosis not present

## 2017-03-07 DIAGNOSIS — Z8249 Family history of ischemic heart disease and other diseases of the circulatory system: Secondary | ICD-10-CM

## 2017-03-07 DIAGNOSIS — E785 Hyperlipidemia, unspecified: Secondary | ICD-10-CM | POA: Diagnosis present

## 2017-03-07 DIAGNOSIS — Z7982 Long term (current) use of aspirin: Secondary | ICD-10-CM | POA: Diagnosis not present

## 2017-03-07 DIAGNOSIS — J90 Pleural effusion, not elsewhere classified: Secondary | ICD-10-CM | POA: Diagnosis present

## 2017-03-07 DIAGNOSIS — I252 Old myocardial infarction: Secondary | ICD-10-CM

## 2017-03-07 DIAGNOSIS — Z09 Encounter for follow-up examination after completed treatment for conditions other than malignant neoplasm: Secondary | ICD-10-CM

## 2017-03-07 DIAGNOSIS — E669 Obesity, unspecified: Secondary | ICD-10-CM | POA: Diagnosis present

## 2017-03-07 DIAGNOSIS — Z79899 Other long term (current) drug therapy: Secondary | ICD-10-CM

## 2017-03-07 DIAGNOSIS — N289 Disorder of kidney and ureter, unspecified: Secondary | ICD-10-CM | POA: Diagnosis present

## 2017-03-07 DIAGNOSIS — Z7901 Long term (current) use of anticoagulants: Secondary | ICD-10-CM

## 2017-03-07 DIAGNOSIS — Z86718 Personal history of other venous thrombosis and embolism: Secondary | ICD-10-CM | POA: Diagnosis not present

## 2017-03-07 HISTORY — PX: CORONARY ARTERY BYPASS GRAFT: SHX141

## 2017-03-07 HISTORY — PX: TEE WITHOUT CARDIOVERSION: SHX5443

## 2017-03-07 LAB — POCT I-STAT, CHEM 8
BUN: 36 mg/dL — ABNORMAL HIGH (ref 6–20)
BUN: 33 mg/dL — ABNORMAL HIGH (ref 6–20)
BUN: 34 mg/dL — ABNORMAL HIGH (ref 6–20)
BUN: 33 mg/dL — ABNORMAL HIGH (ref 6–20)
BUN: 36 mg/dL — ABNORMAL HIGH (ref 6–20)
BUN: 31 mg/dL — ABNORMAL HIGH (ref 6–20)
Calcium, Ion: 1.29 mmol/L (ref 1.15–1.40)
Calcium, Ion: 1.14 mmol/L — ABNORMAL LOW (ref 1.15–1.40)
Calcium, Ion: 1.24 mmol/L (ref 1.15–1.40)
Calcium, Ion: 1.13 mmol/L — ABNORMAL LOW (ref 1.15–1.40)
Calcium, Ion: 1.11 mmol/L — ABNORMAL LOW (ref 1.15–1.40)
Calcium, Ion: 1.25 mmol/L (ref 1.15–1.40)
Chloride: 106 mmol/L (ref 101–111)
Chloride: 105 mmol/L (ref 101–111)
Chloride: 107 mmol/L (ref 101–111)
Chloride: 105 mmol/L (ref 101–111)
Chloride: 104 mmol/L (ref 101–111)
Chloride: 106 mmol/L (ref 101–111)
Creatinine, Ser: 1.3 mg/dL — ABNORMAL HIGH (ref 0.61–1.24)
Creatinine, Ser: 1.2 mg/dL (ref 0.61–1.24)
Creatinine, Ser: 1.3 mg/dL — ABNORMAL HIGH (ref 0.61–1.24)
Creatinine, Ser: 1.1 mg/dL (ref 0.61–1.24)
Creatinine, Ser: 1.3 mg/dL — ABNORMAL HIGH (ref 0.61–1.24)
Creatinine, Ser: 1.3 mg/dL — ABNORMAL HIGH (ref 0.61–1.24)
Glucose, Bld: 134 mg/dL — ABNORMAL HIGH (ref 65–99)
Glucose, Bld: 122 mg/dL — ABNORMAL HIGH (ref 65–99)
Glucose, Bld: 130 mg/dL — ABNORMAL HIGH (ref 65–99)
Glucose, Bld: 163 mg/dL — ABNORMAL HIGH (ref 65–99)
Glucose, Bld: 193 mg/dL — ABNORMAL HIGH (ref 65–99)
Glucose, Bld: 136 mg/dL — ABNORMAL HIGH (ref 65–99)
HCT: 36 % — ABNORMAL LOW (ref 39.0–52.0)
HCT: 29 % — ABNORMAL LOW (ref 39.0–52.0)
HCT: 34 % — ABNORMAL LOW (ref 39.0–52.0)
HCT: 31 % — ABNORMAL LOW (ref 39.0–52.0)
HCT: 28 % — ABNORMAL LOW (ref 39.0–52.0)
HCT: 27 % — ABNORMAL LOW (ref 39.0–52.0)
Hemoglobin: 12.2 g/dL — ABNORMAL LOW (ref 13.0–17.0)
Hemoglobin: 11.6 g/dL — ABNORMAL LOW (ref 13.0–17.0)
Hemoglobin: 9.9 g/dL — ABNORMAL LOW (ref 13.0–17.0)
Hemoglobin: 10.5 g/dL — ABNORMAL LOW (ref 13.0–17.0)
Hemoglobin: 9.5 g/dL — ABNORMAL LOW (ref 13.0–17.0)
Hemoglobin: 9.2 g/dL — ABNORMAL LOW (ref 13.0–17.0)
Potassium: 4.3 mmol/L (ref 3.5–5.1)
Potassium: 4.2 mmol/L (ref 3.5–5.1)
Potassium: 4.4 mmol/L (ref 3.5–5.1)
Potassium: 4.7 mmol/L (ref 3.5–5.1)
Potassium: 4.4 mmol/L (ref 3.5–5.1)
Potassium: 3.8 mmol/L (ref 3.5–5.1)
Sodium: 140 mmol/L (ref 135–145)
Sodium: 139 mmol/L (ref 135–145)
Sodium: 140 mmol/L (ref 135–145)
Sodium: 139 mmol/L (ref 135–145)
Sodium: 140 mmol/L (ref 135–145)
Sodium: 140 mmol/L (ref 135–145)
TCO2: 22 mmol/L (ref 22–32)
TCO2: 21 mmol/L — ABNORMAL LOW (ref 22–32)
TCO2: 24 mmol/L (ref 22–32)
TCO2: 24 mmol/L (ref 22–32)
TCO2: 22 mmol/L (ref 22–32)
TCO2: 25 mmol/L (ref 22–32)

## 2017-03-07 LAB — POCT I-STAT 4, (NA,K, GLUC, HGB,HCT)
Glucose, Bld: 184 mg/dL — ABNORMAL HIGH (ref 65–99)
HCT: 29 % — ABNORMAL LOW (ref 39.0–52.0)
Hemoglobin: 9.9 g/dL — ABNORMAL LOW (ref 13.0–17.0)
Potassium: 4.4 mmol/L (ref 3.5–5.1)
Sodium: 140 mmol/L (ref 135–145)

## 2017-03-07 LAB — GLUCOSE, CAPILLARY
Glucose-Capillary: 111 mg/dL — ABNORMAL HIGH (ref 65–99)
Glucose-Capillary: 141 mg/dL — ABNORMAL HIGH (ref 65–99)
Glucose-Capillary: 131 mg/dL — ABNORMAL HIGH (ref 65–99)
Glucose-Capillary: 145 mg/dL — ABNORMAL HIGH (ref 65–99)
Glucose-Capillary: 133 mg/dL — ABNORMAL HIGH (ref 65–99)
Glucose-Capillary: 122 mg/dL — ABNORMAL HIGH (ref 65–99)
Glucose-Capillary: 136 mg/dL — ABNORMAL HIGH (ref 65–99)

## 2017-03-07 LAB — POCT I-STAT 3, ART BLOOD GAS (G3+)
Acid-base deficit: 5 mmol/L — ABNORMAL HIGH (ref 0.0–2.0)
Acid-base deficit: 4 mmol/L — ABNORMAL HIGH (ref 0.0–2.0)
Acid-base deficit: 3 mmol/L — ABNORMAL HIGH (ref 0.0–2.0)
Acid-base deficit: 2 mmol/L (ref 0.0–2.0)
Acid-base deficit: 5 mmol/L — ABNORMAL HIGH (ref 0.0–2.0)
Bicarbonate: 21.2 mmol/L (ref 20.0–28.0)
Bicarbonate: 22.1 mmol/L (ref 20.0–28.0)
Bicarbonate: 23.5 mmol/L (ref 20.0–28.0)
Bicarbonate: 23.4 mmol/L (ref 20.0–28.0)
Bicarbonate: 21.6 mmol/L (ref 20.0–28.0)
O2 Saturation: 100 %
O2 Saturation: 99 %
O2 Saturation: 99 %
O2 Saturation: 100 %
O2 Saturation: 98 %
Patient temperature: 36.2
Patient temperature: 36.3
Patient temperature: 36.4
TCO2: 22 mmol/L (ref 22–32)
TCO2: 23 mmol/L (ref 22–32)
TCO2: 25 mmol/L (ref 22–32)
TCO2: 25 mmol/L (ref 22–32)
TCO2: 23 mmol/L (ref 22–32)
pCO2 arterial: 42 mmHg (ref 32.0–48.0)
pCO2 arterial: 42.5 mmHg (ref 32.0–48.0)
pCO2 arterial: 45.4 mmHg (ref 32.0–48.0)
pCO2 arterial: 40.8 mmHg (ref 32.0–48.0)
pCO2 arterial: 42.6 mmHg (ref 32.0–48.0)
pH, Arterial: 7.31 — ABNORMAL LOW (ref 7.350–7.450)
pH, Arterial: 7.323 — ABNORMAL LOW (ref 7.350–7.450)
pH, Arterial: 7.318 — ABNORMAL LOW (ref 7.350–7.450)
pH, Arterial: 7.365 (ref 7.350–7.450)
pH, Arterial: 7.311 — ABNORMAL LOW (ref 7.350–7.450)
pO2, Arterial: 362 mmHg — ABNORMAL HIGH (ref 83.0–108.0)
pO2, Arterial: 141 mmHg — ABNORMAL HIGH (ref 83.0–108.0)
pO2, Arterial: 141 mmHg — ABNORMAL HIGH (ref 83.0–108.0)
pO2, Arterial: 252 mmHg — ABNORMAL HIGH (ref 83.0–108.0)
pO2, Arterial: 103 mmHg (ref 83.0–108.0)

## 2017-03-07 LAB — CBC
HCT: 30.6 % — ABNORMAL LOW (ref 39.0–52.0)
HCT: 29.4 % — ABNORMAL LOW (ref 39.0–52.0)
Hemoglobin: 10.2 g/dL — ABNORMAL LOW (ref 13.0–17.0)
Hemoglobin: 10 g/dL — ABNORMAL LOW (ref 13.0–17.0)
MCH: 30.2 pg (ref 26.0–34.0)
MCH: 30.8 pg (ref 26.0–34.0)
MCHC: 33.3 g/dL (ref 30.0–36.0)
MCHC: 34 g/dL (ref 30.0–36.0)
MCV: 90.5 fL (ref 78.0–100.0)
MCV: 90.5 fL (ref 78.0–100.0)
PLATELETS: 158 10*3/uL (ref 150–400)
Platelets: 152 10*3/uL (ref 150–400)
RBC: 3.38 MIL/uL — ABNORMAL LOW (ref 4.22–5.81)
RBC: 3.25 MIL/uL — ABNORMAL LOW (ref 4.22–5.81)
RDW: 13.2 % (ref 11.5–15.5)
RDW: 13.5 % (ref 11.5–15.5)
WBC: 13.1 10*3/uL — AB (ref 4.0–10.5)
WBC: 10.9 10*3/uL — ABNORMAL HIGH (ref 4.0–10.5)

## 2017-03-07 LAB — PROTIME-INR
INR: 1.27
Prothrombin Time: 15.8 seconds — ABNORMAL HIGH (ref 11.4–15.2)

## 2017-03-07 LAB — CREATININE, SERUM
Creatinine, Ser: 1.38 mg/dL — ABNORMAL HIGH (ref 0.61–1.24)
GFR calc Af Amer: >60 mL/min (ref >60)
GFR calc non Af Amer: 52 mL/min — ABNORMAL LOW (ref >60)

## 2017-03-07 LAB — HEMOGLOBIN AND HEMATOCRIT, BLOOD
HCT: 29 % — ABNORMAL LOW (ref 39.0–52.0)
Hemoglobin: 10 g/dL — ABNORMAL LOW (ref 13.0–17.0)

## 2017-03-07 LAB — MAGNESIUM: Magnesium: 2.8 mg/dL — ABNORMAL HIGH (ref 1.7–2.4)

## 2017-03-07 LAB — APTT: APTT: 31 s (ref 24–36)

## 2017-03-07 LAB — PLATELET COUNT: Platelets: 172 10*3/uL (ref 150–400)

## 2017-03-07 SURGERY — CORONARY ARTERY BYPASS GRAFTING (CABG)
Anesthesia: General | Site: Chest

## 2017-03-07 MED ORDER — HEPARIN SODIUM (PORCINE) 1000 UNIT/ML IJ SOLN
INTRAMUSCULAR | Status: DC | PRN
  Administered 2017-03-07: 42000 [IU] via INTRAVENOUS
  Administered 2017-03-07: 3000 [IU] via INTRAVENOUS

## 2017-03-07 MED ORDER — LACTATED RINGERS IV SOLN
INTRAVENOUS | Status: DC | PRN
  Administered 2017-03-07: 07:00:00 via INTRAVENOUS

## 2017-03-07 MED ORDER — SODIUM CHLORIDE 0.9 % IV SOLN
0.0000 ug/min | INTRAVENOUS | Status: DC
  Administered 2017-03-07: 10 ug/min via INTRAVENOUS
  Filled 2017-03-07: qty 20

## 2017-03-07 MED ORDER — ASPIRIN 81 MG PO CHEW
324.0000 mg | CHEWABLE_TABLET | Freq: Every day | ORAL | Status: DC
  Filled 2017-03-07: qty 4

## 2017-03-07 MED ORDER — LACTATED RINGERS IV SOLN: INTRAVENOUS | Status: DC

## 2017-03-07 MED ORDER — BISACODYL 10 MG RE SUPP: 10.0000 mg | Freq: Every day | RECTAL | Status: DC

## 2017-03-07 MED ORDER — FENTANYL CITRATE (PF) 250 MCG/5ML IJ SOLN
INTRAMUSCULAR | Status: AC
  Filled 2017-03-07: qty 5

## 2017-03-07 MED ORDER — MORPHINE SULFATE (PF) 4 MG/ML IV SOLN
2.0000 mg | INTRAVENOUS | Status: DC | PRN
  Administered 2017-03-07 – 2017-03-08 (×2): 4 mg via INTRAVENOUS
  Filled 2017-03-07: qty 1

## 2017-03-07 MED ORDER — CHLORHEXIDINE GLUCONATE 4 % EX LIQD: 30.0000 mL | CUTANEOUS | Status: DC

## 2017-03-07 MED ORDER — PROTAMINE SULFATE 10 MG/ML IV SOLN
INTRAVENOUS | Status: AC
  Filled 2017-03-07: qty 5

## 2017-03-07 MED ORDER — MORPHINE SULFATE (PF) 2 MG/ML IV SOLN: 1.0000 mg | INTRAVENOUS | Status: DC | PRN

## 2017-03-07 MED ORDER — SODIUM CHLORIDE 0.9 % IV SOLN
0.0000 ug/kg/h | INTRAVENOUS | Status: DC
  Administered 2017-03-07: 0.7 ug/kg/h via INTRAVENOUS
  Administered 2017-03-07 – 2017-03-08 (×2): 0.2 ug/kg/h via INTRAVENOUS
  Filled 2017-03-07 (×3): qty 2

## 2017-03-07 MED ORDER — SODIUM CHLORIDE 0.9 % IJ SOLN
OROMUCOSAL | Status: DC | PRN
  Administered 2017-03-07 (×3): 4 mL via TOPICAL

## 2017-03-07 MED ORDER — SODIUM CHLORIDE 0.9% FLUSH
10.0000 mL | Freq: Two times a day (BID) | INTRAVENOUS | Status: DC
  Administered 2017-03-08: 10 mL

## 2017-03-07 MED ORDER — PROTAMINE SULFATE 10 MG/ML IV SOLN
INTRAVENOUS | Status: AC
  Filled 2017-03-07: qty 25

## 2017-03-07 MED ORDER — ACETAMINOPHEN 160 MG/5ML PO SOLN: 650.0000 mg | Freq: Once | ORAL | Status: AC

## 2017-03-07 MED ORDER — FENTANYL CITRATE (PF) 250 MCG/5ML IJ SOLN
INTRAMUSCULAR | Status: AC
  Filled 2017-03-07: qty 25

## 2017-03-07 MED ORDER — PHENYLEPHRINE HCL 10 MG/ML IJ SOLN
INTRAVENOUS | Status: DC | PRN
  Administered 2017-03-07: 20 ug/min via INTRAVENOUS

## 2017-03-07 MED ORDER — LACTATED RINGERS IV SOLN: 500.0000 mL | Freq: Once | INTRAVENOUS | Status: DC | PRN

## 2017-03-07 MED ORDER — SODIUM BICARBONATE 8.4 % IV SOLN
50.0000 meq | Freq: Once | INTRAVENOUS | Status: AC
  Administered 2017-03-07: 50 meq via INTRAVENOUS

## 2017-03-07 MED ORDER — HEPARIN SODIUM (PORCINE) 1000 UNIT/ML IJ SOLN
INTRAMUSCULAR | Status: AC
  Filled 2017-03-07: qty 2

## 2017-03-07 MED ORDER — ALBUMIN HUMAN 5 % IV SOLN
INTRAVENOUS | Status: DC | PRN
  Administered 2017-03-07 (×2): via INTRAVENOUS

## 2017-03-07 MED ORDER — ACETAMINOPHEN 160 MG/5ML PO SOLN: 1000.0000 mg | Freq: Four times a day (QID) | ORAL | Status: AC

## 2017-03-07 MED ORDER — TRAMADOL HCL 50 MG PO TABS: 50.0000 mg | ORAL_TABLET | ORAL | Status: DC | PRN

## 2017-03-07 MED ORDER — DEXTROSE 5 % IV SOLN
INTRAVENOUS | Status: DC | PRN
  Administered 2017-03-07: 750 mg via INTRAVENOUS

## 2017-03-07 MED ORDER — EPHEDRINE 5 MG/ML INJ
INTRAVENOUS | Status: AC
  Filled 2017-03-07: qty 10

## 2017-03-07 MED ORDER — PROPOFOL 10 MG/ML IV BOLUS
INTRAVENOUS | Status: DC | PRN
  Administered 2017-03-07: 60 mg via INTRAVENOUS
  Administered 2017-03-07: 40 mg via INTRAVENOUS

## 2017-03-07 MED ORDER — DEXTROSE 5 % IV SOLN
1.5000 g | Freq: Two times a day (BID) | INTRAVENOUS | Status: AC
  Administered 2017-03-07 – 2017-03-09 (×4): 1.5 g via INTRAVENOUS
  Filled 2017-03-07 (×4): qty 1.5

## 2017-03-07 MED ORDER — INSULIN REGULAR BOLUS VIA INFUSION
0.0000 [IU] | Freq: Three times a day (TID) | INTRAVENOUS | Status: DC
  Filled 2017-03-07: qty 10

## 2017-03-07 MED ORDER — MIDAZOLAM HCL 5 MG/5ML IJ SOLN
INTRAMUSCULAR | Status: DC | PRN
  Administered 2017-03-07: 2 mg via INTRAVENOUS
  Administered 2017-03-07: 5 mg via INTRAVENOUS
  Administered 2017-03-07: 1 mg via INTRAVENOUS
  Administered 2017-03-07: 2 mg via INTRAVENOUS

## 2017-03-07 MED ORDER — METOPROLOL TARTRATE 5 MG/5ML IV SOLN: 2.5000 mg | INTRAVENOUS | Status: DC | PRN

## 2017-03-07 MED ORDER — HEMOSTATIC AGENTS (NO CHARGE) OPTIME
TOPICAL | Status: DC | PRN
  Administered 2017-03-07 (×2): 1 via TOPICAL

## 2017-03-07 MED ORDER — SUCCINYLCHOLINE CHLORIDE 200 MG/10ML IV SOSY
PREFILLED_SYRINGE | INTRAVENOUS | Status: AC
  Filled 2017-03-07: qty 10

## 2017-03-07 MED ORDER — MORPHINE SULFATE (PF) 2 MG/ML IV SOLN: 2.0000 mg | INTRAVENOUS | Status: DC | PRN

## 2017-03-07 MED ORDER — HEPARIN SODIUM (PORCINE) 1000 UNIT/ML IJ SOLN
INTRAMUSCULAR | Status: AC
  Filled 2017-03-07: qty 1

## 2017-03-07 MED ORDER — CHLORHEXIDINE GLUCONATE CLOTH 2 % EX PADS
6.0000 | MEDICATED_PAD | Freq: Every day | CUTANEOUS | Status: DC
  Administered 2017-03-07 – 2017-03-09 (×3): 6 via TOPICAL

## 2017-03-07 MED ORDER — BISACODYL 5 MG PO TBEC
10.0000 mg | DELAYED_RELEASE_TABLET | Freq: Every day | ORAL | Status: DC
  Administered 2017-03-08 – 2017-03-10 (×3): 10 mg via ORAL
  Filled 2017-03-07 (×5): qty 2

## 2017-03-07 MED ORDER — MILRINONE LACTATE IN DEXTROSE 20-5 MG/100ML-% IV SOLN
0.1250 ug/kg/min | INTRAVENOUS | Status: DC
  Administered 2017-03-07 (×2): 0.25 ug/kg/min via INTRAVENOUS
  Administered 2017-03-08: 0.125 ug/kg/min via INTRAVENOUS
  Filled 2017-03-07 (×3): qty 100

## 2017-03-07 MED ORDER — ARTIFICIAL TEARS OPHTHALMIC OINT
TOPICAL_OINTMENT | OPHTHALMIC | Status: AC
  Filled 2017-03-07: qty 3.5

## 2017-03-07 MED ORDER — LIDOCAINE 2% (20 MG/ML) 5 ML SYRINGE
INTRAMUSCULAR | Status: AC
  Filled 2017-03-07: qty 5

## 2017-03-07 MED ORDER — MIDAZOLAM HCL 2 MG/2ML IJ SOLN
INTRAMUSCULAR | Status: AC
  Administered 2017-03-07: 2 mg via INTRAVENOUS
  Filled 2017-03-07: qty 2

## 2017-03-07 MED ORDER — CHLORHEXIDINE GLUCONATE 0.12 % MT SOLN
15.0000 mL | OROMUCOSAL | Status: AC
  Administered 2017-03-07: 15 mL via OROMUCOSAL
  Filled 2017-03-07: qty 15

## 2017-03-07 MED ORDER — NITROGLYCERIN IN D5W 200-5 MCG/ML-% IV SOLN: 0.0000 ug/min | INTRAVENOUS | Status: DC

## 2017-03-07 MED ORDER — SODIUM CHLORIDE 0.9 % IV SOLN
INTRAVENOUS | Status: DC
  Administered 2017-03-07: 14:00:00 via INTRAVENOUS

## 2017-03-07 MED ORDER — SODIUM CHLORIDE 0.9 % IV SOLN: 250.0000 mL | INTRAVENOUS | Status: DC

## 2017-03-07 MED ORDER — ROCURONIUM BROMIDE 10 MG/ML (PF) SYRINGE
PREFILLED_SYRINGE | INTRAVENOUS | Status: DC | PRN
  Administered 2017-03-07 (×3): 50 mg via INTRAVENOUS
  Administered 2017-03-07: 100 mg via INTRAVENOUS
  Administered 2017-03-07: 50 mg via INTRAVENOUS

## 2017-03-07 MED ORDER — ASPIRIN EC 325 MG PO TBEC
325.0000 mg | DELAYED_RELEASE_TABLET | Freq: Every day | ORAL | Status: DC
  Administered 2017-03-08 – 2017-03-13 (×6): 325 mg via ORAL
  Filled 2017-03-07 (×6): qty 1

## 2017-03-07 MED ORDER — MIDAZOLAM HCL 10 MG/2ML IJ SOLN
INTRAMUSCULAR | Status: AC
  Filled 2017-03-07: qty 2

## 2017-03-07 MED ORDER — ALBUMIN HUMAN 5 % IV SOLN: 250.0000 mL | INTRAVENOUS | Status: AC | PRN

## 2017-03-07 MED ORDER — PHENYLEPHRINE 40 MCG/ML (10ML) SYRINGE FOR IV PUSH (FOR BLOOD PRESSURE SUPPORT)
PREFILLED_SYRINGE | INTRAVENOUS | Status: AC
  Filled 2017-03-07: qty 10

## 2017-03-07 MED ORDER — METOPROLOL TARTRATE 12.5 MG HALF TABLET
12.5000 mg | ORAL_TABLET | Freq: Two times a day (BID) | ORAL | Status: DC
  Administered 2017-03-07: 12.5 mg via ORAL
  Filled 2017-03-07 (×2): qty 1

## 2017-03-07 MED ORDER — FENTANYL CITRATE (PF) 100 MCG/2ML IJ SOLN
INTRAMUSCULAR | Status: DC | PRN
  Administered 2017-03-07: 100 ug via INTRAVENOUS
  Administered 2017-03-07: 150 ug via INTRAVENOUS
  Administered 2017-03-07: 250 ug via INTRAVENOUS
  Administered 2017-03-07: 500 ug via INTRAVENOUS
  Administered 2017-03-07 (×2): 100 ug via INTRAVENOUS
  Administered 2017-03-07: 50 ug via INTRAVENOUS
  Administered 2017-03-07: 150 ug via INTRAVENOUS
  Administered 2017-03-07: 250 ug via INTRAVENOUS
  Administered 2017-03-07: 100 ug via INTRAVENOUS

## 2017-03-07 MED ORDER — NITROGLYCERIN 0.2 MG/ML ON CALL CATH LAB
INTRAVENOUS | Status: DC | PRN
  Administered 2017-03-07 (×2): 20 ug via INTRAVENOUS
  Administered 2017-03-07: 40 ug via INTRAVENOUS

## 2017-03-07 MED ORDER — POTASSIUM CHLORIDE 10 MEQ/50ML IV SOLN: 10.0000 meq | INTRAVENOUS | Status: AC

## 2017-03-07 MED ORDER — ARTIFICIAL TEARS OPHTHALMIC OINT
TOPICAL_OINTMENT | OPHTHALMIC | Status: DC | PRN
  Administered 2017-03-07: 1 via OPHTHALMIC

## 2017-03-07 MED ORDER — ACETAMINOPHEN 650 MG RE SUPP
650.0000 mg | Freq: Once | RECTAL | Status: AC
  Administered 2017-03-07: 650 mg via RECTAL

## 2017-03-07 MED ORDER — ROCURONIUM BROMIDE 10 MG/ML (PF) SYRINGE
PREFILLED_SYRINGE | INTRAVENOUS | Status: AC
  Filled 2017-03-07: qty 5

## 2017-03-07 MED ORDER — SODIUM CHLORIDE 0.9% FLUSH
3.0000 mL | Freq: Two times a day (BID) | INTRAVENOUS | Status: DC
  Administered 2017-03-08 – 2017-03-12 (×5): 3 mL via INTRAVENOUS

## 2017-03-07 MED ORDER — ONDANSETRON HCL 4 MG/2ML IJ SOLN
4.0000 mg | Freq: Four times a day (QID) | INTRAMUSCULAR | Status: DC | PRN
  Administered 2017-03-10: 4 mg via INTRAVENOUS
  Filled 2017-03-07: qty 2

## 2017-03-07 MED ORDER — 0.9 % SODIUM CHLORIDE (POUR BTL) OPTIME
TOPICAL | Status: DC | PRN
  Administered 2017-03-07: 6000 mL

## 2017-03-07 MED ORDER — SODIUM CHLORIDE 0.45 % IV SOLN
INTRAVENOUS | Status: DC | PRN
  Administered 2017-03-07: 15:00:00 via INTRAVENOUS

## 2017-03-07 MED ORDER — DOCUSATE SODIUM 100 MG PO CAPS
200.0000 mg | ORAL_CAPSULE | Freq: Every day | ORAL | Status: DC
  Administered 2017-03-08 – 2017-03-12 (×5): 200 mg via ORAL
  Filled 2017-03-07 (×6): qty 2

## 2017-03-07 MED ORDER — SODIUM CHLORIDE 0.9 % IV SOLN: Freq: Once | INTRAVENOUS | Status: DC

## 2017-03-07 MED ORDER — PANTOPRAZOLE SODIUM 40 MG PO TBEC
40.0000 mg | DELAYED_RELEASE_TABLET | Freq: Every day | ORAL | Status: DC
  Administered 2017-03-09 – 2017-03-13 (×5): 40 mg via ORAL
  Filled 2017-03-07 (×5): qty 1

## 2017-03-07 MED ORDER — PROTAMINE SULFATE 10 MG/ML IV SOLN
INTRAVENOUS | Status: DC | PRN
  Administered 2017-03-07: 20 mg via INTRAVENOUS
  Administered 2017-03-07: 380 mg via INTRAVENOUS

## 2017-03-07 MED ORDER — VANCOMYCIN HCL IN DEXTROSE 1-5 GM/200ML-% IV SOLN
1000.0000 mg | Freq: Once | INTRAVENOUS | Status: AC
  Administered 2017-03-07: 1000 mg via INTRAVENOUS
  Filled 2017-03-07: qty 200

## 2017-03-07 MED ORDER — MIDAZOLAM HCL 2 MG/2ML IJ SOLN
2.0000 mg | INTRAMUSCULAR | Status: DC | PRN
  Administered 2017-03-07: 2 mg via INTRAVENOUS

## 2017-03-07 MED ORDER — SODIUM CHLORIDE 0.9% FLUSH: 3.0000 mL | INTRAVENOUS | Status: DC | PRN

## 2017-03-07 MED ORDER — SODIUM CHLORIDE 0.9 % IV SOLN
INTRAVENOUS | Status: DC
  Filled 2017-03-07: qty 1

## 2017-03-07 MED ORDER — LACTATED RINGERS IV SOLN
INTRAVENOUS | Status: DC | PRN
  Administered 2017-03-07 (×2): via INTRAVENOUS

## 2017-03-07 MED ORDER — SODIUM CHLORIDE 0.9% FLUSH
10.0000 mL | INTRAVENOUS | Status: DC | PRN
  Administered 2017-03-10: 10 mL
  Filled 2017-03-07: qty 40

## 2017-03-07 MED ORDER — SODIUM CHLORIDE 0.9 % IV SOLN
20.0000 ug | INTRAVENOUS | Status: AC
  Administered 2017-03-07: 20 ug via INTRAVENOUS
  Filled 2017-03-07: qty 5

## 2017-03-07 MED ORDER — METOPROLOL TARTRATE 25 MG/10 ML ORAL SUSPENSION: 12.5000 mg | Freq: Two times a day (BID) | ORAL | Status: DC

## 2017-03-07 MED ORDER — PHENYLEPHRINE 40 MCG/ML (10ML) SYRINGE FOR IV PUSH (FOR BLOOD PRESSURE SUPPORT)
PREFILLED_SYRINGE | INTRAVENOUS | Status: DC | PRN
  Administered 2017-03-07: 80 ug via INTRAVENOUS
  Administered 2017-03-07: 20 ug via INTRAVENOUS
  Administered 2017-03-07: 80 ug via INTRAVENOUS

## 2017-03-07 MED ORDER — MAGNESIUM SULFATE 4 GM/100ML IV SOLN
4.0000 g | Freq: Once | INTRAVENOUS | Status: AC
  Administered 2017-03-07: 4 g via INTRAVENOUS
  Filled 2017-03-07: qty 100

## 2017-03-07 MED ORDER — TRANEXAMIC ACID 1000 MG/10ML IV SOLN
1.5000 mg/kg/h | INTRAVENOUS | Status: DC
  Filled 2017-03-07: qty 25

## 2017-03-07 MED ORDER — MORPHINE SULFATE (PF) 4 MG/ML IV SOLN
1.0000 mg | INTRAVENOUS | Status: DC | PRN
  Administered 2017-03-07: 4 mg via INTRAVENOUS
  Filled 2017-03-07 (×2): qty 1

## 2017-03-07 MED ORDER — PROPOFOL 10 MG/ML IV BOLUS
INTRAVENOUS | Status: AC
  Filled 2017-03-07: qty 20

## 2017-03-07 MED ORDER — FAMOTIDINE IN NACL 20-0.9 MG/50ML-% IV SOLN
20.0000 mg | Freq: Two times a day (BID) | INTRAVENOUS | Status: AC
  Administered 2017-03-07: 20 mg via INTRAVENOUS

## 2017-03-07 MED ORDER — SODIUM CHLORIDE 0.9 % IV SOLN
INTRAVENOUS | Status: DC | PRN
  Administered 2017-03-07: 13:00:00 via INTRAVENOUS

## 2017-03-07 MED ORDER — OXYCODONE HCL 5 MG PO TABS
5.0000 mg | ORAL_TABLET | ORAL | Status: DC | PRN
  Administered 2017-03-07 – 2017-03-09 (×7): 10 mg via ORAL
  Filled 2017-03-07 (×7): qty 2

## 2017-03-07 MED ORDER — ACETAMINOPHEN 500 MG PO TABS
1000.0000 mg | ORAL_TABLET | Freq: Four times a day (QID) | ORAL | Status: AC
  Administered 2017-03-08 – 2017-03-12 (×17): 1000 mg via ORAL
  Filled 2017-03-07 (×18): qty 2

## 2017-03-07 SURGICAL SUPPLY — 106 items
ADAPTER CARDIO PERF ANTE/RETRO (ADAPTER) ×4 IMPLANT
ADPR PRFSN 84XANTGRD RTRGD (ADAPTER) ×2
BAG DECANTER FOR FLEXI CONT (MISCELLANEOUS) ×4 IMPLANT
BANDAGE ACE 4X5 VEL STRL LF (GAUZE/BANDAGES/DRESSINGS) ×4 IMPLANT
BANDAGE ACE 6X5 VEL STRL LF (GAUZE/BANDAGES/DRESSINGS) ×4 IMPLANT
BASKET HEART  (ORDER IN 25'S) (MISCELLANEOUS) ×1
BASKET HEART (ORDER IN 25'S) (MISCELLANEOUS) ×1
BASKET HEART (ORDER IN 25S) (MISCELLANEOUS) ×2 IMPLANT
BLADE CLIPPER SURG (BLADE) IMPLANT
BLADE STERNUM SYSTEM 6 (BLADE) ×4 IMPLANT
BLADE SURG 12 STRL SS (BLADE) ×4 IMPLANT
BNDG GAUZE ELAST 4 BULKY (GAUZE/BANDAGES/DRESSINGS) ×4 IMPLANT
CANISTER SUCT 3000ML PPV (MISCELLANEOUS) ×4 IMPLANT
CANNULA ARTERIAL NVNT 3/8 22FR (MISCELLANEOUS) ×3 IMPLANT
CANNULA GUNDRY RCSP 15FR (MISCELLANEOUS) ×4 IMPLANT
CATH CPB KIT VANTRIGT (MISCELLANEOUS) ×4 IMPLANT
CATH ROBINSON RED A/P 18FR (CATHETERS) ×28 IMPLANT
CATH THORACIC 36FR RT ANG (CATHETERS) ×4 IMPLANT
CRADLE DONUT ADULT HEAD (MISCELLANEOUS) ×4 IMPLANT
DRAIN CHANNEL 32F RND 10.7 FF (WOUND CARE) ×4 IMPLANT
DRAPE CARDIOVASCULAR INCISE (DRAPES) ×4
DRAPE SLUSH/WARMER DISC (DRAPES) ×4 IMPLANT
DRAPE SRG 135X102X78XABS (DRAPES) ×2 IMPLANT
DRSG AQUACEL AG ADV 3.5X14 (GAUZE/BANDAGES/DRESSINGS) ×4 IMPLANT
ELECT BLADE 4.0 EZ CLEAN MEGAD (MISCELLANEOUS) ×4
ELECT BLADE 6.5 EXT (BLADE) ×4 IMPLANT
ELECT CAUTERY BLADE 6.4 (BLADE) ×4 IMPLANT
ELECT REM PT RETURN 9FT ADLT (ELECTROSURGICAL) ×8
ELECTRODE BLDE 4.0 EZ CLN MEGD (MISCELLANEOUS) ×2 IMPLANT
ELECTRODE REM PT RTRN 9FT ADLT (ELECTROSURGICAL) ×4 IMPLANT
FELT TEFLON 1X6 (MISCELLANEOUS) ×8 IMPLANT
FLOSEAL 10ML (HEMOSTASIS) ×3 IMPLANT
GAUZE SPONGE 4X4 12PLY STRL (GAUZE/BANDAGES/DRESSINGS) ×4 IMPLANT
GAUZE SPONGE 4X4 12PLY STRL LF (GAUZE/BANDAGES/DRESSINGS) ×2 IMPLANT
GLOVE BIO SURGEON STRL SZ 6.5 (GLOVE) ×4 IMPLANT
GLOVE BIO SURGEON STRL SZ7.5 (GLOVE) ×12 IMPLANT
GLOVE BIO SURGEON STRL SZ8.5 (GLOVE) ×8 IMPLANT
GLOVE BIO SURGEONS STRL SZ 6.5 (GLOVE) ×2
GLOVE BIOGEL PI IND STRL 6.5 (GLOVE) ×2 IMPLANT
GLOVE BIOGEL PI IND STRL 8.5 (GLOVE) ×4 IMPLANT
GLOVE BIOGEL PI INDICATOR 6.5 (GLOVE) ×2
GLOVE BIOGEL PI INDICATOR 8.5 (GLOVE) ×8
GOWN STRL REUS W/ TWL LRG LVL3 (GOWN DISPOSABLE) ×8 IMPLANT
GOWN STRL REUS W/ TWL XL LVL3 (GOWN DISPOSABLE) ×4 IMPLANT
GOWN STRL REUS W/TWL 2XL LVL3 (GOWN DISPOSABLE) ×4 IMPLANT
GOWN STRL REUS W/TWL LRG LVL3 (GOWN DISPOSABLE) ×36
GOWN STRL REUS W/TWL XL LVL3 (GOWN DISPOSABLE) ×8
HEMOSTAT POWDER SURGIFOAM 1G (HEMOSTASIS) ×12 IMPLANT
HEMOSTAT SURGICEL 2X14 (HEMOSTASIS) ×4 IMPLANT
INSERT FOGARTY XLG (MISCELLANEOUS) ×2 IMPLANT
KIT BASIN OR (CUSTOM PROCEDURE TRAY) ×4 IMPLANT
KIT ROOM TURNOVER OR (KITS) ×4 IMPLANT
KIT SUCTION CATH 14FR (SUCTIONS) ×4 IMPLANT
KIT VASOVIEW HEMOPRO VH 3000 (KITS) ×4 IMPLANT
LEAD PACING MYOCARDI (MISCELLANEOUS) ×4 IMPLANT
MARKER GRAFT CORONARY BYPASS (MISCELLANEOUS) ×12 IMPLANT
NS IRRIG 1000ML POUR BTL (IV SOLUTION) ×22 IMPLANT
PACK E OPEN HEART (SUTURE) ×4 IMPLANT
PACK OPEN HEART (CUSTOM PROCEDURE TRAY) ×4 IMPLANT
PAD ARMBOARD 7.5X6 YLW CONV (MISCELLANEOUS) ×8 IMPLANT
PAD ELECT DEFIB RADIOL ZOLL (MISCELLANEOUS) ×4 IMPLANT
PENCIL BUTTON HOLSTER BLD 10FT (ELECTRODE) ×4 IMPLANT
POWDER SURGICEL 3.0 GRAM (HEMOSTASIS) ×4 IMPLANT
PUNCH AORTIC ROTATE  4.5MM 8IN (MISCELLANEOUS) ×2 IMPLANT
PUNCH AORTIC ROTATE 4.0MM (MISCELLANEOUS) IMPLANT
PUNCH AORTIC ROTATE 4.5MM 8IN (MISCELLANEOUS) IMPLANT
PUNCH AORTIC ROTATE 5MM 8IN (MISCELLANEOUS) IMPLANT
SET CARDIOPLEGIA MPS 5001102 (MISCELLANEOUS) ×2 IMPLANT
SPONGE LAP 18X18 X RAY DECT (DISPOSABLE) ×6 IMPLANT
SPONGE LAP 4X18 X RAY DECT (DISPOSABLE) ×2 IMPLANT
SURGIFLO W/THROMBIN 8M KIT (HEMOSTASIS) ×4 IMPLANT
SUT BONE WAX W31G (SUTURE) ×4 IMPLANT
SUT MNCRL AB 4-0 PS2 18 (SUTURE) IMPLANT
SUT PROLENE 3 0 SH DA (SUTURE) IMPLANT
SUT PROLENE 3 0 SH1 36 (SUTURE) IMPLANT
SUT PROLENE 4 0 RB 1 (SUTURE) ×8
SUT PROLENE 4 0 SH DA (SUTURE) ×4 IMPLANT
SUT PROLENE 4-0 RB1 .5 CRCL 36 (SUTURE) ×3 IMPLANT
SUT PROLENE 5 0 C 1 36 (SUTURE) IMPLANT
SUT PROLENE 6 0 C 1 30 (SUTURE) ×2 IMPLANT
SUT PROLENE 6 0 CC (SUTURE) ×12 IMPLANT
SUT PROLENE 8 0 BV175 6 (SUTURE) ×4 IMPLANT
SUT PROLENE BLUE 7 0 (SUTURE) ×7 IMPLANT
SUT PROLENE POLY MONO (SUTURE) ×2 IMPLANT
SUT SILK  1 MH (SUTURE)
SUT SILK 1 MH (SUTURE) IMPLANT
SUT SILK 2 0 SH CR/8 (SUTURE) ×2 IMPLANT
SUT SILK 3 0 SH CR/8 (SUTURE) IMPLANT
SUT STEEL 6MS V (SUTURE) ×8 IMPLANT
SUT STEEL SZ 6 DBL 3X14 BALL (SUTURE) ×4 IMPLANT
SUT VIC AB 1 CTX 27 (SUTURE) ×12 IMPLANT
SUT VIC AB 1 CTX 36 (SUTURE) ×8
SUT VIC AB 1 CTX36XBRD ANBCTR (SUTURE) ×4 IMPLANT
SUT VIC AB 2-0 CT1 27 (SUTURE) ×4
SUT VIC AB 2-0 CT1 TAPERPNT 27 (SUTURE) ×2 IMPLANT
SUT VIC AB 2-0 CTX 27 (SUTURE) IMPLANT
SUT VIC AB 3-0 X1 27 (SUTURE) ×2 IMPLANT
SYSTEM SAHARA CHEST DRAIN ATS (WOUND CARE) ×4 IMPLANT
TAPE CLOTH SURG 4X10 WHT LF (GAUZE/BANDAGES/DRESSINGS) ×2 IMPLANT
TAPE PAPER 2X10 WHT MICROPORE (GAUZE/BANDAGES/DRESSINGS) ×2 IMPLANT
TOWEL GREEN STERILE (TOWEL DISPOSABLE) ×4 IMPLANT
TOWEL GREEN STERILE FF (TOWEL DISPOSABLE) ×4 IMPLANT
TRAY FOLEY SILVER 16FR TEMP (SET/KITS/TRAYS/PACK) ×4 IMPLANT
TUBING INSUFFLATION (TUBING) ×4 IMPLANT
UNDERPAD 30X30 (UNDERPADS AND DIAPERS) ×4 IMPLANT
WATER STERILE IRR 1000ML POUR (IV SOLUTION) ×8 IMPLANT

## 2017-03-07 NOTE — Progress Notes (Signed)
Pre Procedure note for inpatients:   MICHELE KERLIN has been scheduled for Procedure(s): CORONARY ARTERY BYPASS GRAFTING (CABG),TEE (N/A) TRANSESOPHAGEAL ECHOCARDIOGRAM (TEE) (N/A) today. The various methods of treatment have been discussed with the patient. After consideration of the risks, benefits and treatment options the patient has consented to the planned procedure.   The patient has been seen and labs reviewed. There are no changes in the patient's condition to prevent proceeding with the planned procedure today.  Recent labs:  Lab Results  Component Value Date   WBC 7.2 03/04/2017   HGB 13.8 03/04/2017   HCT 40.4 03/04/2017   PLT 225 03/04/2017   GLUCOSE 122 (H) 03/04/2017   ALT 17 03/04/2017   AST 19 03/04/2017   NA 136 03/04/2017   K 4.2 03/04/2017   CL 105 03/04/2017   CREATININE 1.28 (H) 03/04/2017   BUN 32 (H) 03/04/2017   CO2 18 (L) 03/04/2017   INR 0.97 03/04/2017   HGBA1C 7.2 (H) 03/04/2017    Len Childs, MD 03/07/2017 7:03 AM

## 2017-03-07 NOTE — Progress Notes (Signed)
  Echocardiogram Echocardiogram Transesophageal has been performed.  Travis Blair 03/07/2017, 8:20 AM

## 2017-03-07 NOTE — Anesthesia Procedure Notes (Signed)
Procedure Name: Intubation Date/Time: 03/07/2017 7:47 AM Performed by: Harden Mo, CRNA Pre-anesthesia Checklist: Patient identified, Emergency Drugs available, Suction available and Patient being monitored Patient Re-evaluated:Patient Re-evaluated prior to induction Oxygen Delivery Method: Circle System Utilized Preoxygenation: Pre-oxygenation with 100% oxygen Induction Type: IV induction Ventilation: Mask ventilation without difficulty and Oral airway inserted - appropriate to patient size Laryngoscope Size: Sabra Heck and 2 Grade View: Grade I Tube type: Oral Tube size: 8.0 mm Number of attempts: 1 Airway Equipment and Method: Stylet and Oral airway Placement Confirmation: ETT inserted through vocal cords under direct vision,  positive ETCO2 and breath sounds checked- equal and bilateral Secured at: 23 cm Tube secured with: Tape Dental Injury: Teeth and Oropharynx as per pre-operative assessment

## 2017-03-07 NOTE — Anesthesia Procedure Notes (Signed)
Arterial Line Insertion Start/End1/25/2019 6:50 AM, 03/07/2017 6:57 AM Performed by: Harden Mo, CRNA, CRNA  Patient location: Pre-op. Preanesthetic checklist: patient identified, IV checked, site marked, risks and benefits discussed, surgical consent, monitors and equipment checked, pre-op evaluation and anesthesia consent Lidocaine 1% used for infiltration and patient sedated Left, radial was placed Catheter size: 20 G Hand hygiene performed  and maximum sterile barriers used   Attempts: 1 Procedure performed without using ultrasound guided technique. Ultrasound Notes:anatomy identified, needle tip was noted to be adjacent to the nerve/plexus identified and no ultrasound evidence of intravascular and/or intraneural injection Following insertion, dressing applied and Biopatch. Post procedure assessment: normal  Patient tolerated the procedure well with no immediate complications.

## 2017-03-07 NOTE — Transfer of Care (Signed)
Immediate Anesthesia Transfer of Care Note  Patient: Travis Blair  Procedure(s) Performed: CORONARY ARTERY BYPASS GRAFTING (CABG), X 3 , USING RIGHT INTERNAL MAMMARY ARTERY, AND RIGHT LEG GREATER SAPHENOUS VEIN HARVESTED ENDOSCOPICALLY (N/A Chest) TRANSESOPHAGEAL ECHOCARDIOGRAM (TEE) (N/A )  Patient Location: ICU  Anesthesia Type:General  Level of Consciousness: sedated, unresponsive and Patient remains intubated per anesthesia plan  Airway & Oxygen Therapy: Patient remains intubated per anesthesia plan and Patient placed on Ventilator (see vital sign flow sheet for setting)  Post-op Assessment: Report given to RN and Post -op Vital signs reviewed and stable  Post vital signs: Reviewed and stable  Last Vitals:  Vitals:   03/07/17 0606  BP: (!) 186/81  Pulse: 70  Resp: 20  Temp: 36.7 C  SpO2: 100%    Last Pain:  Vitals:   03/07/17 0606  TempSrc: Oral         Complications: No apparent anesthesia complications

## 2017-03-07 NOTE — Anesthesia Postprocedure Evaluation (Signed)
Anesthesia Post Note  Patient: Derward Marple Splitt  Procedure(s) Performed: CORONARY ARTERY BYPASS GRAFTING (CABG), X 3 , USING RIGHT INTERNAL MAMMARY ARTERY, AND RIGHT LEG GREATER SAPHENOUS VEIN HARVESTED ENDOSCOPICALLY (N/A Chest) TRANSESOPHAGEAL ECHOCARDIOGRAM (TEE) (N/A )     Patient location during evaluation: SICU Anesthesia Type: General Level of consciousness: sedated Pain management: pain level controlled Vital Signs Assessment: post-procedure vital signs reviewed and stable Respiratory status: patient remains intubated per anesthesia plan Cardiovascular status: stable Postop Assessment: no apparent nausea or vomiting Anesthetic complications: no    Last Vitals:  Vitals:   03/07/17 1500 03/07/17 1515  BP: 95/66   Pulse: 90 90  Resp: 16 13  Temp: (!) 36.1 C (!) 36.1 C  SpO2: 100% 100%    Last Pain:  Vitals:   03/07/17 0606  TempSrc: Oral                 Janeya Deyo,W. EDMOND

## 2017-03-07 NOTE — Progress Notes (Signed)
CT surgery p.m. Rounds  Status post CABG 3 Extubated with stable hemodynamics Will apply mask for CPAP this p.m.-patient has history of sleep apnea

## 2017-03-07 NOTE — Brief Op Note (Signed)
03/07/2017  7:52 AM  PATIENT:  Nell Range Dowell  67 y.o. male  PRE-OPERATIVE DIAGNOSIS:  Coronary Artery Disease  POST-OPERATIVE DIAGNOSIS:  Coronary Artery Disease  PROCEDURE:  Procedure(s): CORONARY ARTERY BYPASS GRAFTING (CABG), X 3 , USING RIGHT INTERNAL MAMMARY ARTERY, AND RIGHT LEG GREATER SAPHENOUS VEIN HARVESTED ENDOSCOPICALLY (N/A) TRANSESOPHAGEAL ECHOCARDIOGRAM (TEE) (N/A) LIMA-LAD SVG-OM SVG-PD  SURGEON:  Surgeon(s) and Role:    [name], MD - Primary  PHYSICIAN ASSISTANT: Phong Isenberg PA-C  ASSISTANTS: none   ANESTHESIA:   general  EBL:  1100 mL   BLOOD ADMINISTERED:Other Dictation: Dictation Number PENDING  DRAINS: CHEST TUBES IN MEDIASTINAL AND PLEURAL SPACE   LOCAL MEDICATIONS USED:  NONE  SPECIMEN:  No Specimen  DISPOSITION OF SPECIMEN:  N/A  COUNTS:  YES  TOURNIQUET:  * No tourniquets in log *  DICTATION: .Other Dictation: Dictation Number PENDING  PLAN OF CARE: Admit to inpatient   PATIENT DISPOSITION:  ICU - intubated and hemodynamically stable.   Delay start of Pharmacological VTE agent (>24hrs) due to surgical blood loss or risk of bleeding: yes

## 2017-03-07 NOTE — Anesthesia Procedure Notes (Signed)
Central Venous Catheter Insertion Performed by: Duane Boston, MD, anesthesiologist Start/End1/25/2019 6:41 AM, 03/07/2017 6:51 AM Patient location: Pre-op. Preanesthetic checklist: patient identified, IV checked, site marked, risks and benefits discussed, surgical consent, monitors and equipment checked, pre-op evaluation, timeout performed and anesthesia consent Position: Trendelenburg Lidocaine 1% used for infiltration and patient sedated Hand hygiene performed , maximum sterile barriers used  and Seldinger technique used Catheter size: 8.5 Fr Total catheter length 8. PA cath was placed.Sheath introducer Swan type:thermodilution PA Cath depth:50 Procedure performed using ultrasound guided technique. Ultrasound Notes:anatomy identified, needle tip was noted to be adjacent to the nerve/plexus identified, no ultrasound evidence of intravascular and/or intraneural injection and image(s) printed for medical record Attempts: 1 Following insertion, line sutured and dressing applied. Post procedure assessment: free fluid flow, blood return through all ports and no air  Patient tolerated the procedure well with no immediate complications.

## 2017-03-07 NOTE — Procedures (Addendum)
Extubation Procedure Note  Patient Details:   Name: Travis Blair [facility] DOB: [birth year] MRN: [id number]   Airway Documentation:     Evaluation  O2 sats: stable throughout Complications: No apparent complications Patient did tolerate procedure well. Bilateral Breath Sounds: Clear   Yes   Patient extubated to [place]. Vital signs stable. Patient tolerating well at this time. No complications. RT will continue to monitor. NIF-35 VC-1.6L  Mcneil Sober 03/07/2017, 5:12 PM

## 2017-03-07 NOTE — OR Nursing (Signed)
12:30 - 45 minute call to SICU charge nurse 13:15 - 20 minute call to SICU charge nurse

## 2017-03-08 ENCOUNTER — Inpatient Hospital Stay (HOSPITAL_COMMUNITY): Payer: PPO

## 2017-03-08 LAB — POCT I-STAT, CHEM 8
BUN: 33 mg/dL — ABNORMAL HIGH (ref 6–20)
Calcium, Ion: 1.17 mmol/L (ref 1.15–1.40)
Chloride: 101 mmol/L (ref 101–111)
Creatinine, Ser: 1.5 mg/dL — ABNORMAL HIGH (ref 0.61–1.24)
Glucose, Bld: 225 mg/dL — ABNORMAL HIGH (ref 65–99)
HCT: 30 % — ABNORMAL LOW (ref 39.0–52.0)
Hemoglobin: 10.2 g/dL — ABNORMAL LOW (ref 13.0–17.0)
Potassium: 4.6 mmol/L (ref 3.5–5.1)
Sodium: 135 mmol/L (ref 135–145)
TCO2: 22 mmol/L (ref 22–32)

## 2017-03-08 LAB — CBC
HEMATOCRIT: 30.9 % — AB (ref 39.0–52.0)
HEMATOCRIT: 31.1 % — AB (ref 39.0–52.0)
Hemoglobin: 10.3 g/dL — ABNORMAL LOW (ref 13.0–17.0)
Hemoglobin: 10.3 g/dL — ABNORMAL LOW (ref 13.0–17.0)
MCH: 30.4 pg (ref 26.0–34.0)
MCH: 30.5 pg (ref 26.0–34.0)
MCHC: 33.3 g/dL (ref 30.0–36.0)
MCHC: 33.1 g/dL (ref 30.0–36.0)
MCV: 91.2 fL (ref 78.0–100.0)
MCV: 92 fL (ref 78.0–100.0)
Platelets: 170 10*3/uL (ref 150–400)
Platelets: 171 10*3/uL (ref 150–400)
RBC: 3.39 MIL/uL — ABNORMAL LOW (ref 4.22–5.81)
RBC: 3.38 MIL/uL — ABNORMAL LOW (ref 4.22–5.81)
RDW: 13.5 % (ref 11.5–15.5)
RDW: 13.8 % (ref 11.5–15.5)
WBC: 10.9 10*3/uL — ABNORMAL HIGH (ref 4.0–10.5)
WBC: 15.8 10*3/uL — ABNORMAL HIGH (ref 4.0–10.5)

## 2017-03-08 LAB — PREPARE FRESH FROZEN PLASMA
UNIT DIVISION: 0
Unit division: 0

## 2017-03-08 LAB — BASIC METABOLIC PANEL
Anion gap: 9 (ref 5–15)
BUN: 33 mg/dL — AB (ref 6–20)
CALCIUM: 8 mg/dL — AB (ref 8.9–10.3)
CO2: 22 mmol/L (ref 22–32)
CREATININE: 1.33 mg/dL — AB (ref 0.61–1.24)
Chloride: 107 mmol/L (ref 101–111)
GFR calc Af Amer: >60 mL/min (ref >60)
GFR calc non Af Amer: 55 mL/min — ABNORMAL LOW (ref >60)
GLUCOSE: 107 mg/dL — AB (ref 65–99)
Potassium: 4 mmol/L (ref 3.5–5.1)
Sodium: 138 mmol/L (ref 135–145)

## 2017-03-08 LAB — BPAM FFP
BLOOD PRODUCT EXPIRATION DATE: 201901292359
Blood Product Expiration Date: 201901292359
ISSUE DATE / TIME: 201901251255
ISSUE DATE / TIME: 201901251255
Unit Type and Rh: 6200
Unit Type and Rh: 6200

## 2017-03-08 LAB — GLUCOSE, CAPILLARY
Glucose-Capillary: 102 mg/dL — ABNORMAL HIGH (ref 65–99)
Glucose-Capillary: 110 mg/dL — ABNORMAL HIGH (ref 65–99)
Glucose-Capillary: 113 mg/dL — ABNORMAL HIGH (ref 65–99)
Glucose-Capillary: 115 mg/dL — ABNORMAL HIGH (ref 65–99)
Glucose-Capillary: 128 mg/dL — ABNORMAL HIGH (ref 65–99)
Glucose-Capillary: 132 mg/dL — ABNORMAL HIGH (ref 65–99)
Glucose-Capillary: 138 mg/dL — ABNORMAL HIGH (ref 65–99)
Glucose-Capillary: 156 mg/dL — ABNORMAL HIGH (ref 65–99)
Glucose-Capillary: 160 mg/dL — ABNORMAL HIGH (ref 65–99)
Glucose-Capillary: 166 mg/dL — ABNORMAL HIGH (ref 65–99)
Glucose-Capillary: 207 mg/dL — ABNORMAL HIGH (ref 65–99)
Glucose-Capillary: 211 mg/dL — ABNORMAL HIGH (ref 65–99)
Glucose-Capillary: 224 mg/dL — ABNORMAL HIGH (ref 65–99)
Glucose-Capillary: 89 mg/dL (ref 65–99)

## 2017-03-08 LAB — MAGNESIUM
Magnesium: 2.5 mg/dL — ABNORMAL HIGH (ref 1.7–2.4)
Magnesium: 2.6 mg/dL — ABNORMAL HIGH (ref 1.7–2.4)

## 2017-03-08 LAB — CREATININE, SERUM
Creatinine, Ser: 1.74 mg/dL — ABNORMAL HIGH (ref 0.61–1.24)
GFR calc non Af Amer: 39 mL/min — ABNORMAL LOW (ref >60)
GFR, EST AFRICAN AMERICAN: 46 mL/min — AB (ref >60)

## 2017-03-08 MED ORDER — ALBUMIN HUMAN 5 % IV SOLN: 12.5000 g | Freq: Once | INTRAVENOUS | Status: AC

## 2017-03-08 MED ORDER — POTASSIUM CHLORIDE 10 MEQ/50ML IV SOLN
10.0000 meq | INTRAVENOUS | Status: AC
  Administered 2017-03-08 (×2): 10 meq via INTRAVENOUS
  Filled 2017-03-08 (×2): qty 50

## 2017-03-08 MED ORDER — INSULIN DETEMIR 100 UNIT/ML ~~LOC~~ SOLN
12.0000 [IU] | Freq: Once | SUBCUTANEOUS | Status: AC
  Administered 2017-03-08: 12 [IU] via SUBCUTANEOUS
  Filled 2017-03-08: qty 0.12

## 2017-03-08 MED ORDER — ENOXAPARIN SODIUM 40 MG/0.4ML ~~LOC~~ SOLN
40.0000 mg | SUBCUTANEOUS | Status: DC
  Administered 2017-03-08 – 2017-03-12 (×5): 40 mg via SUBCUTANEOUS
  Filled 2017-03-08 (×5): qty 0.4

## 2017-03-08 MED ORDER — FUROSEMIDE 10 MG/ML IJ SOLN
20.0000 mg | Freq: Once | INTRAMUSCULAR | Status: AC
  Administered 2017-03-08: 20 mg via INTRAVENOUS
  Filled 2017-03-08: qty 2

## 2017-03-08 MED ORDER — VANCOMYCIN HCL IN DEXTROSE 1-5 GM/200ML-% IV SOLN
1000.0000 mg | Freq: Once | INTRAVENOUS | Status: AC
  Administered 2017-03-08: 1000 mg via INTRAVENOUS
  Filled 2017-03-08: qty 200

## 2017-03-08 MED ORDER — ALBUMIN HUMAN 5 % IV SOLN
INTRAVENOUS | Status: AC
  Administered 2017-03-08: 12.5 g
  Filled 2017-03-08: qty 250

## 2017-03-08 MED ORDER — METOPROLOL TARTRATE 25 MG PO TABS
25.0000 mg | ORAL_TABLET | Freq: Two times a day (BID) | ORAL | Status: DC
  Administered 2017-03-08 – 2017-03-12 (×8): 25 mg via ORAL
  Filled 2017-03-08 (×8): qty 1

## 2017-03-08 MED ORDER — INSULIN DETEMIR 100 UNIT/ML ~~LOC~~ SOLN
18.0000 [IU] | Freq: Two times a day (BID) | SUBCUTANEOUS | Status: DC
Start: 2017-03-08 — End: 2017-03-10
  Administered 2017-03-08 – 2017-03-10 (×4): 18 [IU] via SUBCUTANEOUS
  Filled 2017-03-08 (×5): qty 0.18

## 2017-03-08 MED ORDER — FUROSEMIDE 10 MG/ML IJ SOLN
20.0000 mg | Freq: Two times a day (BID) | INTRAMUSCULAR | Status: DC
  Administered 2017-03-08 (×2): 20 mg via INTRAVENOUS
  Filled 2017-03-08 (×2): qty 2

## 2017-03-08 MED ORDER — INSULIN ASPART 100 UNIT/ML ~~LOC~~ SOLN
0.0000 [IU] | SUBCUTANEOUS | Status: DC
  Administered 2017-03-08 (×2): 8 [IU] via SUBCUTANEOUS
  Administered 2017-03-08: 2 [IU] via SUBCUTANEOUS
  Administered 2017-03-08: 8 [IU] via SUBCUTANEOUS
  Administered 2017-03-09 (×2): 4 [IU] via SUBCUTANEOUS

## 2017-03-08 MED ORDER — FUROSEMIDE 10 MG/ML IJ SOLN
40.0000 mg | Freq: Two times a day (BID) | INTRAMUSCULAR | Status: DC
  Filled 2017-03-08: qty 4

## 2017-03-08 NOTE — Progress Notes (Signed)
1 Day Post-Op Procedure(s) (LRB): CORONARY ARTERY BYPASS GRAFTING (CABG), X 3 , USING RIGHT INTERNAL MAMMARY ARTERY, AND RIGHT LEG GREATER SAPHENOUS VEIN HARVESTED ENDOSCOPICALLY (N/A) TRANSESOPHAGEAL ECHOCARDIOGRAM (TEE) (N/A) Subjective: Obese male progressing well after multivessel CABG Uses CPAP mask at night for sleep apnea Stable hemodynamics, sinus rhythm Needs diuresis, IV Lasix ordered  Objective: Vital signs in last 24 hours: Temp:  [97 F (36.1 C)-98.6 F (37 C)] 98.6 F (37 C) (01/26 0800) Pulse Rate:  [76-96] 96 (01/26 1100) Cardiac Rhythm: Atrial paced (01/26 0800) Resp:  [11-32] 32 (01/26 1100) BP: (89-125)/(59-75) 122/73 (01/26 1100) SpO2:  [91 %-100 %] 91 % (01/26 1100) Arterial Line BP: (92-151)/(49-68) 122/50 (01/26 1000) FiO2 (%):  [40 %-50 %] 40 % (01/25 1640) Weight:  [296 lb 15.4 oz (134.7 kg)] 296 lb 15.4 oz (134.7 kg) (01/26 0330)  Hemodynamic parameters for last 24 hours: PAP: (17-42)/(7-21) 29/14 CO:  [5.5 L/min-8.4 L/min] 6.7 L/min CI:  [2.2 L/min/m2-3.3 L/min/m2] 2.6 L/min/m2  Intake/Output from previous day: 01/25 0701 - 01/26 0700 In: 6138.2 [P.O.:300; I.V.:3727.2; Blood:1131; NG/GT:30; IV Piggyback:950] Out: 6812 [Urine:2490; Blood:1100; Chest Tube:500] Intake/Output this shift: Total I/O In: 622.4 [P.O.:200; I.V.:172.4; IV Piggyback:250] Out: 315 [Urine:175; Chest Tube:140]       Exam    General- alert and comfortable    Neck- no JVD, no cervical adenopathy palpable, no carotid bruit   Lungs- clear without rales, wheezes   Cor- regular rate and rhythm, no murmur , gallop   Abdomen- soft, non-tender   Extremities - warm, non-tender, minimal edema   Neuro- oriented, appropriate, no focal weakness   Lab Results: Recent Labs    03/07/17 1916 03/07/17 1933 03/08/17 0349  WBC 10.9*  --  10.9*  HGB 10.0* 9.2* 10.3*  HCT 29.4* 27.0* 30.9*  PLT 152  --  170   BMET:  Recent Labs    03/07/17 1933 03/08/17 0349  NA 140 138  K 3.8  4.0  CL 106 107  CO2  --  22  GLUCOSE 136* 107*  BUN 31* 33*  CREATININE 1.30* 1.33*  CALCIUM  --  8.0*    PT/INR:  Recent Labs    03/07/17 1345  LABPROT 15.8*  INR 1.27   ABG    Component Value Date/Time   PHART 7.311 (L) 03/07/2017 1813   HCO3 21.6 03/07/2017 1813   TCO2 25 03/07/2017 1933   ACIDBASEDEF 5.0 (H) 03/07/2017 1813   O2SAT 98.0 03/07/2017 1813   CBG (last 3)  Recent Labs    03/08/17 0549 03/08/17 0752 03/08/17 1008  GLUCAP 110* 89 138*    Assessment/Plan: S/P Procedure(s) (LRB): CORONARY ARTERY BYPASS GRAFTING (CABG), X 3 , USING RIGHT INTERNAL MAMMARY ARTERY, AND RIGHT LEG GREATER SAPHENOUS VEIN HARVESTED ENDOSCOPICALLY (N/A) TRANSESOPHAGEAL ECHOCARDIOGRAM (TEE) (N/A) Mobilize Diuresis Diabetes control d/c tubes/lines Start Lovenox and Coumadin for preoperative DVT  LOS: 1 day    Travis Blair 03/08/2017

## 2017-03-08 NOTE — Progress Notes (Signed)
CTS  Stable day Walked x2 [place] 92% on RA

## 2017-03-09 ENCOUNTER — Inpatient Hospital Stay (HOSPITAL_COMMUNITY): Payer: PPO

## 2017-03-09 LAB — BASIC METABOLIC PANEL
Anion gap: 9 (ref 5–15)
BUN: 40 mg/dL — ABNORMAL HIGH (ref 6–20)
CO2: 23 mmol/L (ref 22–32)
Calcium: 8 mg/dL — ABNORMAL LOW (ref 8.9–10.3)
Chloride: 102 mmol/L (ref 101–111)
Creatinine, Ser: 1.94 mg/dL — ABNORMAL HIGH (ref 0.61–1.24)
GFR calc Af Amer: 40 mL/min — ABNORMAL LOW (ref >60)
GFR calc non Af Amer: 35 mL/min — ABNORMAL LOW (ref >60)
Glucose, Bld: 174 mg/dL — ABNORMAL HIGH (ref 65–99)
Potassium: 4.2 mmol/L (ref 3.5–5.1)
Sodium: 134 mmol/L — ABNORMAL LOW (ref 135–145)

## 2017-03-09 LAB — CBC
HCT: 28.6 % — ABNORMAL LOW (ref 39.0–52.0)
Hemoglobin: 9.5 g/dL — ABNORMAL LOW (ref 13.0–17.0)
MCH: 30.6 pg (ref 26.0–34.0)
MCHC: 33.2 g/dL (ref 30.0–36.0)
MCV: 92.3 fL (ref 78.0–100.0)
Platelets: 148 10*3/uL — ABNORMAL LOW (ref 150–400)
RBC: 3.1 MIL/uL — ABNORMAL LOW (ref 4.22–5.81)
RDW: 13.7 % (ref 11.5–15.5)
WBC: 14.2 10*3/uL — ABNORMAL HIGH (ref 4.0–10.5)

## 2017-03-09 LAB — GLUCOSE, CAPILLARY
Glucose-Capillary: 168 mg/dL — ABNORMAL HIGH (ref 65–99)
Glucose-Capillary: 174 mg/dL — ABNORMAL HIGH (ref 65–99)
Glucose-Capillary: 163 mg/dL — ABNORMAL HIGH (ref 65–99)
Glucose-Capillary: 189 mg/dL — ABNORMAL HIGH (ref 65–99)
Glucose-Capillary: 173 mg/dL — ABNORMAL HIGH (ref 65–99)

## 2017-03-09 LAB — PROTIME-INR
INR: 1.33
Prothrombin Time: 16.4 seconds — ABNORMAL HIGH (ref 11.4–15.2)

## 2017-03-09 MED ORDER — MAGNESIUM HYDROXIDE 400 MG/5ML PO SUSP: 30.0000 mL | Freq: Every day | ORAL | Status: DC | PRN

## 2017-03-09 MED ORDER — FUROSEMIDE 10 MG/ML IJ SOLN: 40.0000 mg | Freq: Every day | INTRAMUSCULAR | Status: DC

## 2017-03-09 MED ORDER — SODIUM CHLORIDE 0.9% FLUSH
3.0000 mL | Freq: Two times a day (BID) | INTRAVENOUS | Status: DC
  Administered 2017-03-09 – 2017-03-11 (×2): 3 mL via INTRAVENOUS

## 2017-03-09 MED ORDER — SORBITOL 70 % PO SOLN
60.0000 mL | Freq: Once | ORAL | Status: AC
  Administered 2017-03-09: 60 mL via ORAL
  Filled 2017-03-09: qty 60

## 2017-03-09 MED ORDER — INSULIN ASPART 100 UNIT/ML ~~LOC~~ SOLN
0.0000 [IU] | Freq: Three times a day (TID) | SUBCUTANEOUS | Status: DC
  Administered 2017-03-09 – 2017-03-10 (×4): 4 [IU] via SUBCUTANEOUS
  Administered 2017-03-10: 2 [IU] via SUBCUTANEOUS
  Administered 2017-03-10: 4 [IU] via SUBCUTANEOUS
  Administered 2017-03-10: 8 [IU] via SUBCUTANEOUS
  Administered 2017-03-11 (×2): 2 [IU] via SUBCUTANEOUS
  Administered 2017-03-11: 4 [IU] via SUBCUTANEOUS
  Administered 2017-03-11: 2 [IU] via SUBCUTANEOUS
  Administered 2017-03-12: 4 [IU] via SUBCUTANEOUS
  Administered 2017-03-12: 2 [IU] via SUBCUTANEOUS
  Administered 2017-03-12: 12 [IU] via SUBCUTANEOUS
  Administered 2017-03-13: 4 [IU] via SUBCUTANEOUS
  Administered 2017-03-13: 2 [IU] via SUBCUTANEOUS

## 2017-03-09 MED ORDER — SODIUM CHLORIDE 0.9% FLUSH: 3.0000 mL | INTRAVENOUS | Status: DC | PRN

## 2017-03-09 MED ORDER — SODIUM CHLORIDE 0.9 % IV SOLN: 250.0000 mL | INTRAVENOUS | Status: DC | PRN

## 2017-03-09 MED ORDER — METOLAZONE 5 MG PO TABS
5.0000 mg | ORAL_TABLET | Freq: Every day | ORAL | Status: AC
  Administered 2017-03-09 – 2017-03-10 (×2): 5 mg via ORAL
  Filled 2017-03-09 (×2): qty 1

## 2017-03-09 MED ORDER — FUROSEMIDE 10 MG/ML IJ SOLN
80.0000 mg | Freq: Every day | INTRAMUSCULAR | Status: DC
  Administered 2017-03-09 – 2017-03-10 (×2): 80 mg via INTRAVENOUS
  Filled 2017-03-09: qty 8

## 2017-03-09 MED ORDER — MOVING RIGHT ALONG BOOK
Freq: Once | Status: AC
  Administered 2017-03-09: 16:00:00
  Filled 2017-03-09: qty 1

## 2017-03-09 NOTE — Progress Notes (Signed)
2 Days Post-Op Procedure(s) (LRB): CORONARY ARTERY BYPASS GRAFTING (CABG), X 3 , USING RIGHT INTERNAL MAMMARY ARTERY, AND RIGHT LEG GREATER SAPHENOUS VEIN HARVESTED ENDOSCOPICALLY (N/A) TRANSESOPHAGEAL ECHOCARDIOGRAM (TEE) (N/A) Subjective: Getting stonger nsr  creat sl up 1.9 [ preop1.7]  Objective: Vital signs in last 24 hours: Temp:  [98.2 F (36.8 C)-98.7 F (37.1 C)] 98.5 F (36.9 C) (01/27 0908) Pulse Rate:  [80-95] 83 (01/27 0800) Cardiac Rhythm: Normal sinus rhythm (01/27 0800) Resp:  [13-27] 16 (01/27 0900) BP: (96-129)/(61-76) 129/71 (01/27 0900) SpO2:  [90 %-99 %] 97 % (01/27 0800) Arterial Line BP: (127-151)/(54-60) 151/59 (01/26 1600) Weight:  [300 lb 4.3 oz (136.2 kg)] 300 lb 4.3 oz (136.2 kg) (01/27 0600)  Hemodynamic parameters for last 24 hours:    Intake/Output from previous day: 01/26 0701 - 01/27 0700 In: 2422 [P.O.:1080; I.V.:692; IV Piggyback:650] Out: 1250 [Urine:830; Chest Tube:420] Intake/Output this shift: Total I/O In: 384.9 [P.O.:360; I.V.:24.9] Out: -        Exam    General- alert and comfortable    Neck- no JVD, no cervical adenopathy palpable, no carotid bruit   Lungs- clear without rales, wheezes   Cor- regular rate and rhythm, no murmur , gallop   Abdomen- soft, non-tender   Extremities - warm, non-tender, minimal edema   Neuro- oriented, appropriate, no focal weakness   Lab Results: Recent Labs    03/08/17 1610 03/08/17 1626 03/09/17 0313  WBC 15.8*  --  14.2*  HGB 10.3* 10.2* 9.5*  HCT 31.1* 30.0* 28.6*  PLT 171  --  148*   BMET:  Recent Labs    03/08/17 0349  03/08/17 1626 03/09/17 0313  NA 138  --  135 134*  K 4.0  --  4.6 4.2  CL 107  --  101 102  CO2 22  --   --  23  GLUCOSE 107*  --  225* 174*  BUN 33*  --  33* 40*  CREATININE 1.33*   < > 1.50* 1.94*  CALCIUM 8.0*  --   --  8.0*   < > = values in this interval not displayed.    PT/INR:  Recent Labs    03/09/17 0313  LABPROT 16.4*  INR 1.33   ABG   Component Value Date/Time   PHART 7.311 (L) 03/07/2017 1813   HCO3 21.6 03/07/2017 1813   TCO2 22 03/08/2017 1626   ACIDBASEDEF 5.0 (H) 03/07/2017 1813   O2SAT 98.0 03/07/2017 1813   CBG (last 3)  Recent Labs    03/08/17 2328 03/09/17 0316 03/09/17 0904  GLUCAP 211* 168* 174*    Assessment/Plan: S/P Procedure(s) (LRB): CORONARY ARTERY BYPASS GRAFTING (CABG), X 3 , USING RIGHT INTERNAL MAMMARY ARTERY, AND RIGHT LEG GREATER SAPHENOUS VEIN HARVESTED ENDOSCOPICALLY (N/A) TRANSESOPHAGEAL ECHOCARDIOGRAM (TEE) (N/A) Mobilize Diuresis Diabetes control Plan for transfer to step-down: see transfer orders start eliquis at preop dse after DC for recent hx LLE DVT   LOS: 2 days    Travis Blair 03/09/2017

## 2017-03-10 ENCOUNTER — Inpatient Hospital Stay (HOSPITAL_COMMUNITY): Payer: PPO

## 2017-03-10 ENCOUNTER — Other Ambulatory Visit: Payer: Self-pay

## 2017-03-10 ENCOUNTER — Encounter (HOSPITAL_COMMUNITY): Payer: Self-pay | Admitting: Cardiothoracic Surgery

## 2017-03-10 LAB — URINALYSIS, ROUTINE W REFLEX MICROSCOPIC
BILIRUBIN URINE: NEGATIVE
Glucose, UA: NEGATIVE mg/dL
Hgb urine dipstick: NEGATIVE
KETONES UR: NEGATIVE mg/dL
Leukocytes, UA: NEGATIVE
Nitrite: NEGATIVE
PROTEIN: 30 mg/dL — AB
Specific Gravity, Urine: 1.013 (ref 1.005–1.030)
pH: 5 (ref 5.0–8.0)

## 2017-03-10 LAB — GLUCOSE, CAPILLARY
Glucose-Capillary: 131 mg/dL — ABNORMAL HIGH (ref 65–99)
Glucose-Capillary: 185 mg/dL — ABNORMAL HIGH (ref 65–99)
Glucose-Capillary: 190 mg/dL — ABNORMAL HIGH (ref 65–99)
Glucose-Capillary: 213 mg/dL — ABNORMAL HIGH (ref 65–99)

## 2017-03-10 LAB — BASIC METABOLIC PANEL
Anion gap: 11 (ref 5–15)
BUN: 49 mg/dL — ABNORMAL HIGH (ref 6–20)
CO2: 22 mmol/L (ref 22–32)
Calcium: 8.3 mg/dL — ABNORMAL LOW (ref 8.9–10.3)
Chloride: 99 mmol/L — ABNORMAL LOW (ref 101–111)
Creatinine, Ser: 1.97 mg/dL — ABNORMAL HIGH (ref 0.61–1.24)
GFR calc Af Amer: 39 mL/min — ABNORMAL LOW (ref >60)
GFR calc non Af Amer: 34 mL/min — ABNORMAL LOW (ref >60)
Glucose, Bld: 151 mg/dL — ABNORMAL HIGH (ref 65–99)
Potassium: 4.1 mmol/L (ref 3.5–5.1)
Sodium: 132 mmol/L — ABNORMAL LOW (ref 135–145)

## 2017-03-10 LAB — CBC
HCT: 28.6 % — ABNORMAL LOW (ref 39.0–52.0)
Hemoglobin: 9.4 g/dL — ABNORMAL LOW (ref 13.0–17.0)
MCH: 30.3 pg (ref 26.0–34.0)
MCHC: 32.9 g/dL (ref 30.0–36.0)
MCV: 92.3 fL (ref 78.0–100.0)
Platelets: 170 10*3/uL (ref 150–400)
RBC: 3.1 MIL/uL — ABNORMAL LOW (ref 4.22–5.81)
RDW: 13.7 % (ref 11.5–15.5)
WBC: 12.1 10*3/uL — ABNORMAL HIGH (ref 4.0–10.5)

## 2017-03-10 MED ORDER — GLIPIZIDE ER 10 MG PO TB24
10.0000 mg | ORAL_TABLET | Freq: Every day | ORAL | Status: DC
  Administered 2017-03-11 – 2017-03-13 (×3): 10 mg via ORAL
  Filled 2017-03-10 (×3): qty 1

## 2017-03-10 MED ORDER — TAMSULOSIN HCL 0.4 MG PO CAPS
0.4000 mg | ORAL_CAPSULE | Freq: Every day | ORAL | Status: DC
  Administered 2017-03-10 – 2017-03-12 (×3): 0.4 mg via ORAL
  Filled 2017-03-10 (×3): qty 1

## 2017-03-10 MED ORDER — AMIODARONE HCL IN DEXTROSE 360-4.14 MG/200ML-% IV SOLN
60.0000 mg/h | INTRAVENOUS | Status: AC
Start: 2017-03-10 — End: 2017-03-10
  Administered 2017-03-10: 60 mg/h via INTRAVENOUS
  Filled 2017-03-10 (×2): qty 200

## 2017-03-10 MED ORDER — AMIODARONE HCL IN DEXTROSE 360-4.14 MG/200ML-% IV SOLN
60.0000 mg/h | INTRAVENOUS | Status: DC
  Administered 2017-03-10: 60 mg/h via INTRAVENOUS

## 2017-03-10 MED ORDER — AMIODARONE HCL 200 MG PO TABS
400.0000 mg | ORAL_TABLET | Freq: Two times a day (BID) | ORAL | Status: DC
  Administered 2017-03-10: 400 mg via ORAL
  Filled 2017-03-10: qty 2

## 2017-03-10 MED ORDER — AMIODARONE HCL IN DEXTROSE 360-4.14 MG/200ML-% IV SOLN
30.0000 mg/h | INTRAVENOUS | Status: DC
  Administered 2017-03-11 (×2): 30 mg/h via INTRAVENOUS
  Filled 2017-03-10: qty 200

## 2017-03-10 MED ORDER — METOCLOPRAMIDE HCL 5 MG/ML IJ SOLN
10.0000 mg | Freq: Four times a day (QID) | INTRAMUSCULAR | Status: DC
  Administered 2017-03-10 – 2017-03-11 (×6): 10 mg via INTRAVENOUS
  Filled 2017-03-10 (×7): qty 2

## 2017-03-10 MED ORDER — SORBITOL 70 % PO SOLN
60.0000 mL | Freq: Once | ORAL | Status: AC
  Administered 2017-03-10: 60 mL via ORAL
  Filled 2017-03-10: qty 60

## 2017-03-10 MED ORDER — GLUCERNA SHAKE PO LIQD
237.0000 mL | Freq: Three times a day (TID) | ORAL | Status: DC
  Administered 2017-03-10 – 2017-03-13 (×3): 237 mL via ORAL

## 2017-03-10 MED FILL — Heparin Sodium (Porcine) Inj 1000 Unit/ML: INTRAMUSCULAR | Qty: 30 | Status: AC

## 2017-03-10 MED FILL — Magnesium Sulfate Inj 50%: INTRAMUSCULAR | Qty: 10 | Status: AC

## 2017-03-10 MED FILL — Potassium Chloride Inj 2 mEq/ML: INTRAVENOUS | Qty: 40 | Status: AC

## 2017-03-10 NOTE — Progress Notes (Addendum)
      [address]       [place] [address]             [phone]        3 Days Post-Op Procedure(s) (LRB): CORONARY ARTERY BYPASS GRAFTING (CABG), X 3 , USING RIGHT INTERNAL MAMMARY ARTERY, AND RIGHT LEG GREATER SAPHENOUS VEIN HARVESTED ENDOSCOPICALLY (N/A) TRANSESOPHAGEAL ECHOCARDIOGRAM (TEE) (N/A)  Subjective: Wife at bedside. Patient does not feel well this am. Wife reports (and he confirms) he does not have much appetite, has nausea, no bowel movement yet (and he goes daily), and difficult for urinary stream to start.  Objective: Vital signs in last 24 hours: Temp:  [97.6 F (36.4 C)-99.8 F (37.7 C)] 97.6 F (36.4 C) (01/28 0825) Pulse Rate:  [69-83] 81 (01/28 0936) Cardiac Rhythm: Atrial fibrillation (01/28 0700) Resp:  [12-30] 30 (01/28 0936) BP: (108-155)/(62-80) 155/78 (01/28 0936) SpO2:  [89 %-100 %] 100 % (01/28 0936) Weight:  [299 lb 13.2 oz (136 kg)] 299 lb 13.2 oz (136 kg) (01/28 0700)  Pre op weight 131.5 kg Current Weight  03/10/17 299 lb 13.2 oz (136 kg)       Intake/Output from previous day: 01/27 0701 - 01/28 0700 In: 1224.9 [P.O.:1200; I.V.:24.9] Out: 1805 [Urine:1805]   Physical Exam:  Cardiovascular: IRRR IRRR Pulmonary: Diminished at bases Abdomen: Soft, non tender, bowel sounds present. Extremities: Bilateral lower extremity edema. Wounds: Sternal wound is clean and dry.  No erythema or signs of infection.  Lab Results: CBC: Recent Labs    03/09/17 0313 03/10/17 0513  WBC 14.2* 12.1*  HGB 9.5* 9.4*  HCT 28.6* 28.6*  PLT 148* 170   BMET:  Recent Labs    03/09/17 0313 03/10/17 0513  NA 134* 132*  K 4.2 4.1  CL 102 99*  CO2 23 22  GLUCOSE 174* 151*  BUN 40* 49*  CREATININE 1.94* 1.97*  CALCIUM 8.0* 8.3*    PT/INR:  Lab Results  Component Value Date   INR 1.33 03/09/2017   INR 1.27 03/07/2017   INR 0.97 03/04/2017   ABG:  INR: Will add last result for INR, ABG once components are  confirmed Will add last 4 CBG results once components are confirmed  Assessment/Plan:  1. CV - A fib with CVR. On Amiodarone 400 mg bid, Lopressor 25 mg bid. Monitor BP as may need to add ACE/ARB. May need anticoagulation-per patient, was on Eliquis previously.  2.  Pulmonary - On room air. CXR this am shows no pneumothorax, bibasilar atelectasis and small pleural effusions, and vascular congestion. Encourage incentive spirometer. 3. Volume Overload - On Lasix 80 mg IV daily 4.  Acute blood loss anemia - H and H 9.4 and 28.6  5. DM-CBGs 189/173/131. On Insulin. Will restart Glipizide as taken pre op in am. Pre op HGA1C 7.2 6. BMET in am 7. Remove EPW in `1-2 days 8. GU-start Flomax and check UA as had pre op UTI 9. GI-Reglan for possible gastroparesis, Glucerna shakes, and if no bowel movement today, LOC. Has had nausea prior to being on Amiodarone. Monitor for worsening nausea.  Donielle M ZimmermanPA-C 03/10/2017,11:00 AM  Iv amiodarone until patient converts to NSR Continue lovenox for now  Transition to Eliquis for preop DVT at discharge  patient examined and medical record reviewed,agree with above note. Tharon Aquas Trigt III 03/10/2017

## 2017-03-10 NOTE — Progress Notes (Addendum)
CARDIAC REHAB PHASE I   PRE:  Rate/Rhythm: 82 afib with PVC    BP: sitting 136/79    SaO2: 100 RA  MODE:  Ambulation: 164 ft   POST:  Rate/Rhythm: 90 afib with PVC    BP: sitting 155/78     SaO2: 100 RA  Pt in controlled afib, asx. Steady walking with RW. Noticeably diaphoretic, he sts this is his norm. His wife sts he passes out at home so we stayed close to the room. Overall pt tolerated well, to bed (needed assist with his feet to get in bed).  Encouraged IS, x2 more walks, sitting in recliner. Will f/u tomorrow. [place], ACSM 03/10/2017 9:53 AM

## 2017-03-10 NOTE — Care Management Note (Signed)
Case Management Note Marvetta Gibbons RN, BSN Unit 4E-Case Manager [phone]  Patient Details  Name: Travis Blair MRN: [id number] Date of Birth: 1950/06/07  Subjective/Objective: Pt admitted s/p CABG x3                 Action/Plan: PTA pt lived at home with spouse- independent- anticipate return home- order placed for [facility]- spoke with pt and wife at bedside regarding transition needs- choice offered for [facility] agency- per choice they would like to use [facility]- discussed DME needs pt would like rollator for home and tub bench- explained tub bench would be an out of pocket expense-(insurance does not cover)- pt and wife would like to go ahead and get both DME here from hospital with understanding that they will have to pay out of pocket for tub bench- referral for [facility] and DME needs called to Butch Penny with [facility]- rollator and tub bench to be delivered to room prior to discharge. - CM will continue to follow for any further transition needs.   Expected Discharge Date:                  Expected Discharge Plan:  [place]  In-House Referral:  NA  Discharge planning Services  CM Consult  Post Acute Care Choice:  Durable Medical Equipment, Home Health Choice offered to:  Patient, Spouse  DME Arranged:  Tub bench, Walker rolling with seat DME Agency:  [place]:  RN [facility] Agency:  [place]  Status of Service:  In process, will continue to follow  If discussed at Long Length of Stay Meetings, dates discussed:    Discharge Disposition: home/home health   Additional Comments:  Dawayne Patricia, RN 03/10/2017, 3:04 PM

## 2017-03-10 NOTE — Progress Notes (Signed)
Patient O2 sat was 88% on room air at rest, patient placed on 2L oxygen , O 2 sat increased to 93% will continue  to monitor.

## 2017-03-10 NOTE — Progress Notes (Signed)
Jadene Pierini PA, notified that patient is in Afib HR in the 80's verbal order received for amiodarone 400mg  PO BID first dose now, will continue to monitor.

## 2017-03-10 NOTE — Op Note (Signed)
NAME:  Travis Blair, Travis Blair                ACCOUNT NO.:  MEDICAL RECORD NO.:  [id number]  LOCATION:                                 FACILITY:  PHYSICIAN:  [name], M.D.       DATE OF BIRTH:  DATE OF PROCEDURE:  03/07/2017 DATE OF DISCHARGE:                              OPERATIVE REPORT   OPERATIONS: 1. Coronary artery bypass grafting x3 (left internal mammary artery to     left anterior descending, saphenous vein graft to posterior     descending, saphenous vein graft to circumflex marginal). 2. Endoscopic harvest of right leg greater saphenous vein.  SURGEON:  [name], MD.  ASSISTANT:  John Giovanni, PA-C.  PREOPERATIVE DIAGNOSES:  Severe three-vessel coronary artery disease, class 3 angina.  POSTOPERATIVE DIAGNOSES:  Severe three-vessel coronary artery disease, class 3 angina.  CLINICAL NOTE:  The patient is an obese, diabetic, nonsmoker with exertional shortness of breath, increasing in frequency and intensity. Stress test was positive and subsequent cardiac catheterization demonstrated severe 3-vessel coronary artery disease with occlusion of the circumflex, 90% stenosis of the dominant RCA, and 90% stenosis of the LAD.  LV systolic function was fairly well preserved.  He was felt to be a candidate for surgical coronary revascularization based on his three-vessel disease and diabetes.  I saw the patient in consultation in the office and discussed the procedure of CABG for treatment of his CAD in detail.  He understood the benefits of the operation, the alternatives to surgery, and the risks including risks of stroke, bleeding, postoperative infection, postoperative pulmonary problems including pleural effusion, organ failure, and death.  He agreed to proceed with surgery under what I felt was an informed consent.  OPERATIVE FINDINGS: 1. Obese body habitus with difficult exposure of the heart. 2. Good conduits and adequate targets for grafting. 3. No  packed cell transfusion required for the surgery. 4. Preservation of LV systolic function after separation from     cardiopulmonary bypass.  DESCRIPTION OF PROCEDURE:  The patient was brought to the operating room and placed supine on the operating table.  General anesthesia was induced under invasive hemodynamic monitoring.  The chest, abdomen, and legs were prepped with Betadine and draped as a sterile field.  A transesophageal echo probe was placed by the Anesthesia team.  A proper time-out was performed.  A sternal incision was made as the saphenous vein was harvested endoscopically from the right leg.  The left internal mammary artery was harvested as a pedicle graft from its origin at the subclavian vessels.  It was a 1.5 mm diameter and good flow.  A sternal retractor was placed using the deep blades.  The pericardium was suspended.  Purse-string was placed in ascending aorta and right atrium and heparin was administered.  When the ACT was therapeutic, the patient was cannulated and placed on cardiopulmonary bypass.  The coronaries were identified for grafting and the mammary artery and vein grafts were prepared for the distal anastomoses.  Cardioplegia cannulas were placed both antegrade and retrograde cold blood cardioplegia.  The aortic crossclamp was applied and a liter of cold blood cardioplegia was delivered.  There was good  cardioplegic arrest and septal temperature dropped less than 14 degrees.  Cardioplegia was delivered every 20 minutes.  The distal coronary anastomoses were performed.  The first distal anastomosis was to the posterior descending.  There was a proximal 90% stenosis.  A reversed saphenous vein was sewn end-to-side with running 7- 0 Prolene with good flow through the graft.  Cardioplegia was redosed.  The second distal anastomosis was to the distal circumflex marginal. This was occluded proximally.  A reversed saphenous vein was sewn end-to- side with  running 7-0 Prolene with good flow through the graft. Cardioplegia was redosed.  The third distal anastomosis was to the mid LAD.  It was a 1.5-mm vessel with a proximal 90% stenosis.  The left IMA pedicle was brought through an opening and the left lateral pericardium was brought down onto the LAD and sewn end-to-side with running 8-0 Prolene.  There was good flow through the anastomosis after briefly releasing the pedicle bulldog on the mammary artery.  The bulldog was reapplied.  The pedicle was secured to the epicardium with 6-0 Prolene.  Cardioplegia was redosed.  While the crossclamp was still in place, 2 proximal vein anastomoses were performed on the ascending aorta using a 4.5-mm punch and running 6- 0 Prolene.  Prior to removing the crossclamp, air was vented from the coronaries and the left side of the heart using a dose of retrograde warm blood cardioplegia in the usual de-airing maneuvers on bypass.  The crossclamp was removed.  The heart resumed a spontaneous rhythm.  The vein grafts were de-aired and opened and each had good flow and hemostasis was documented at the proximal and distal sites.  The patient was rewarmed and reperfused. Temporary pacing wires were applied.  The lungs were expanded.  The patient was started on low-dose milrinone.  The patient was weaned off cardiopulmonary bypass without difficulty.  Hemodynamics were stable. Echocardiogram showed normal LV function.  Protamine was administered without adverse reaction.  The cannulas were removed.  There was still diffuse coagulopathy after normalization of the ACT and the patient was given FFP and desmopressin with improved coagulation function.  The sternum was closed with wire.  The pectoralis fascia was closed with a running #1 Vicryl.  Chest tubes have been placed for anterior mediastinal and left pleural space drainage.  The subcutaneous and skin layers were closed using running Vicryl and sterile  dressings were applied.  Total cardiopulmonary bypass time was 108 minutes.     [name], M.D.     PV/MEDQ  D:  03/07/2017  T:  03/07/2017  Job:  [id number]

## 2017-03-10 NOTE — Progress Notes (Signed)
Patient refused to ambulate at this time saying he was tired, patient educated on the need to keep ambulating , he verbalized understanding, will continue to monitor.

## 2017-03-11 LAB — BASIC METABOLIC PANEL
Anion gap: 11 (ref 5–15)
BUN: 60 mg/dL — AB (ref 6–20)
CHLORIDE: 101 mmol/L (ref 101–111)
CO2: 22 mmol/L (ref 22–32)
CREATININE: 2.19 mg/dL — AB (ref 0.61–1.24)
Calcium: 8.2 mg/dL — ABNORMAL LOW (ref 8.9–10.3)
GFR calc Af Amer: 35 mL/min — ABNORMAL LOW (ref >60)
GFR calc non Af Amer: 30 mL/min — ABNORMAL LOW (ref >60)
GLUCOSE: 165 mg/dL — AB (ref 65–99)
Potassium: 3.7 mmol/L (ref 3.5–5.1)
Sodium: 134 mmol/L — ABNORMAL LOW (ref 135–145)

## 2017-03-11 LAB — GLUCOSE, CAPILLARY
Glucose-Capillary: 137 mg/dL — ABNORMAL HIGH (ref 65–99)
Glucose-Capillary: 188 mg/dL — ABNORMAL HIGH (ref 65–99)
Glucose-Capillary: 138 mg/dL — ABNORMAL HIGH (ref 65–99)
Glucose-Capillary: 139 mg/dL — ABNORMAL HIGH (ref 65–99)

## 2017-03-11 LAB — TSH: TSH: 4.235 u[IU]/mL (ref 0.350–4.500)

## 2017-03-11 MED ORDER — AMIODARONE HCL 200 MG PO TABS
400.0000 mg | ORAL_TABLET | Freq: Two times a day (BID) | ORAL | Status: DC
  Administered 2017-03-11 – 2017-03-13 (×5): 400 mg via ORAL
  Filled 2017-03-11 (×5): qty 2

## 2017-03-11 MED ORDER — POTASSIUM CHLORIDE CRYS ER 20 MEQ PO TBCR
40.0000 meq | EXTENDED_RELEASE_TABLET | Freq: Two times a day (BID) | ORAL | Status: AC
  Administered 2017-03-11 (×2): 40 meq via ORAL
  Filled 2017-03-11 (×2): qty 2

## 2017-03-11 MED ORDER — FUROSEMIDE 40 MG PO TABS
40.0000 mg | ORAL_TABLET | Freq: Every day | ORAL | Status: DC
  Administered 2017-03-11 – 2017-03-13 (×3): 40 mg via ORAL
  Filled 2017-03-11 (×3): qty 1

## 2017-03-11 MED ORDER — METFORMIN HCL 500 MG PO TABS: 500.0000 mg | ORAL_TABLET | Freq: Two times a day (BID) | ORAL | Status: DC

## 2017-03-11 NOTE — Progress Notes (Signed)
CARDIAC REHAB PHASE I   PRE:  Rate/Rhythm: 71 SR with PVC    BP: sitting 118/71, standing 98/82    SaO2: 96 2L, 96 RA  MODE:  Ambulation: 344 ft   POST:  Rate/Rhythm: 84 SR with PVC    BP: sitting 150/67     SaO2: 98 RA  Pt had bad day yesterday and was told to "stay in bed". Sounds like he was orthostatic (which wife sts he has a h/o). For this reason, they would like a rollator for home.  Pt feeling much better, in NSR, SaO2 good off O2. I took his BP standing, which dropped slightly. Pt able to walk with rollator without any c/o. To recliner, BP elevated. Still maintaining NSR, SAO2 98 RA. Left off O2 and encouraged x2 more walks today. Pt practiced IS, 1200 mL, encouraged more use. Will f/u tomorrow. [place], ACSM 03/11/2017 9:13 AM

## 2017-03-11 NOTE — Discharge Summary (Signed)
Physician Discharge Summary  Patient ID: Travis Blair MRN: [id number] DOB/AGE: Oct 25, 1950 67 y.o.  Admit date: 03/07/2017 Discharge date: 03/13/2017  Admission Diagnoses: Severe coronary artery disease  Discharge Diagnoses:  Active Problems:   S/P CABG x 3   Patient Active Problem List   Diagnosis Date Noted  . S/P CABG x 3 03/07/2017  . Abnormal stress test 12/18/2016  . Right atrial dilatation 11/17/2016  . Right ventricular dilation 11/17/2016  . Near syncope 11/16/2016  . Uncontrolled type 2 diabetes mellitus with hyperglycemia, without long-term current use of insulin ([place]) 11/16/2016  . Type 2 diabetes mellitus with hyperlipidemia ([place]) 11/16/2016  . Hyperlipidemia, unspecified 11/16/2016  . Obesity 11/16/2016  . Hyponatremia 11/16/2016  . AKI (acute kidney injury) ([facility]) 11/16/2016    Discharged Condition: good  Hospital Course: The patient was admitted electively and taken to the operating room on 03/07/2017 where he underwent coronary artery bypass grafting x3.  He tolerated the procedure well and was taken to the surgical intensive care unit in stable condition.  Postoperative hospital course:  Overall the patient has made very steady progress.  He was extubated using standard postoperative protocols without difficulty.  He has remained neurologically intact.  He does have postoperative volume overload but is responding well to diuretics.  He does have some acute on chronic renal insufficiency with values have stabilized.  He does have an expected acute blood loss anemia and hemoglobin and hematocrit are stable.  Most recent R 10.6/31.2.  His diabetes has been managed using standard protocols with transition to his oral regimen.  He will require aggressive nutritional lifestyle management as an outpatient.  He is tolerating gradually increasing activities using  standard postoperative cardiac rehab protocols.  He did develop postoperative atrial fibrillation but has  subsequently been chemically cardioverted to sinus rhythm with amiodarone.  He does have occasional PVCs and is on metoprolol.  Oxygen is weaned and he is maintaining adequate oxygen saturations.  Incisions are noted to be healing well without evidence of infection.  He is tolerating cardiac rehab modalities.  At time of discharge the patient is felt to be quite stable.  Consults: None  Significant Diagnostic Studies: Routine postoperative labs and serial chest x-rays  Treatments: surgery:   PHYSICIAN:  [name], M.D.       DATE OF BIRTH:  DATE OF PROCEDURE:  03/07/2017 DATE OF DISCHARGE:                              OPERATIVE REPORT   OPERATIONS: 1. Coronary artery bypass grafting x3 (left internal mammary artery to     left anterior descending, saphenous vein graft to posterior     descending, saphenous vein graft to circumflex marginal). 2. Endoscopic harvest of right leg greater saphenous vein.  SURGEON:  [name], MD.  ASSISTANT:  John Giovanni, PA-C.  PREOPERATIVE DIAGNOSES:  Severe three-vessel coronary artery disease, class 3 angina.  POSTOPERATIVE DIAGNOSES:  Severe three-vessel coronary artery disease, class 3 angina.    Discharge Exam: Blood pressure (!) 147/76, pulse 66, temperature 98.4 F (36.9 C), temperature source Oral, resp. rate 18, height 6\' 2"  (1.88 m), weight 290 lb 14.4 oz (132 kg), SpO2 97 %.  General appearance: alert, cooperative and no distress Heart: regular rate and rhythm Lungs: clear to auscultation bilaterally Abdomen: benign Extremities: min edema Wound: incis healing well   Disposition: 01-Home or Self Care  Discharge Instructions  Discharge patient   Complete by:  As directed    Discharge disposition:  01-Home or Self Care   Discharge patient date:  03/13/2017     Allergies as of 03/13/2017   No Known Allergies     Medication List    STOP taking these medications   ciprofloxacin 500 MG  tablet Commonly known as:  CIPRO   diclofenac sodium 1 % Gel Commonly known as:  VOLTAREN   isosorbide mononitrate 30 MG 24 hr tablet Commonly known as:  IMDUR     TAKE these medications   acetaminophen 500 MG tablet Commonly known as:  TYLENOL Take 1,000 mg daily as needed by mouth for moderate pain.   amiodarone 200 MG tablet Commonly known as:  PACERONE Take 1 tablet (200 mg total) by mouth 2 (two) times daily.   apixaban 5 MG Tabs tablet Commonly known as:  ELIQUIS Take 1 tablet (5 mg total) by mouth 2 (two) times daily.   aspirin EC 81 MG tablet Take 81 mg by mouth daily.   glipiZIDE 10 MG 24 hr tablet Commonly known as:  GLUCOTROL XL TAKE 1 TABLET BY MOUTH EVERY MORNING   metoprolol succinate 25 MG 24 hr tablet Commonly known as:  TOPROL-XL Take 0.5 tablets (12.5 mg total) by mouth every morning.   oxyCODONE 5 MG immediate release tablet Commonly known as:  Oxy IR/ROXICODONE Take 1-2 tablets (5-10 mg total) by mouth every 6 (six) hours as needed for severe pain.            Durable Medical Equipment  (From admission, onward)        Start     Ordered   03/10/17 1444  For home use only DME 4 wheeled rolling walker with seat  Once    Comments:  Post op  Question:  Patient needs a walker to treat with the following condition  Answer:  Weakness   03/10/17 1443   03/10/17 1444  For home use only DME Tub bench  Once     03/10/17 1443     [facility] Follow up.   Why:  rollator and tub bench arranged- to be delivered to room prior to discharge.  Contact information: [place] [address] [place], [facility] Follow up.   Specialty:  Home Health Services Why:  HHRN arranged- they will call you to set up home visits Contact information: [address] [place] [address] [phone]        [name], MD Follow up.   Specialty:   Cardiothoracic Surgery Why:  Appointment to see Dr. Darcey Nora on 04/09/2017 at  12:30 PM.  Please obtain a chest x-ray at [facility] at 12 noon.  [facility] is located in the same office complex on the first floor. Contact information: [address] [place] [address] [phone]        [name], MD Follow up.   Specialty:  Cardiology Why:  2 week appointment Contact information: [place] [address] [phone]           Signed: John Giovanni 03/13/2017, 7:38 AM

## 2017-03-11 NOTE — Progress Notes (Signed)
Patient ambulated 515ft in the hall using a Rolator, ambulation well tolerated, will continue to monitor.

## 2017-03-11 NOTE — Progress Notes (Signed)
Patient ambulated 559ft in the hall on room air using a rollator, ambulation well tolerated will continue to monitor.

## 2017-03-11 NOTE — Progress Notes (Addendum)
[address]       [place] [address]             [phone]      4 Days Post-Op Procedure(s) (LRB): CORONARY ARTERY BYPASS GRAFTING (CABG), X 3 , USING RIGHT INTERNAL MAMMARY ARTERY, AND RIGHT LEG GREATER SAPHENOUS VEIN HARVESTED ENDOSCOPICALLY (N/A) TRANSESOPHAGEAL ECHOCARDIOGRAM (TEE) (N/A) Subjective: Feels pretty well, had a BM. Sinus rhythm with PVC's  Objective: Vital signs in last 24 hours: Temp:  [97.6 F (36.4 C)-99.5 F (37.5 C)] 97.8 F (36.6 C) (01/29 0434) Pulse Rate:  [73-86] 73 (01/29 0614) Cardiac Rhythm: Normal sinus rhythm (01/29 0700) Resp:  [20-30] 20 (01/29 0614) BP: (103-155)/(65-80) 119/73 (01/29 0434) SpO2:  [89 %-100 %] 96 % (01/29 0614) Weight:  [295 lb 6.4 oz (134 kg)] 295 lb 6.4 oz (134 kg) (01/29 0962)  Hemodynamic parameters for last 24 hours:    Intake/Output from previous day: 01/28 0701 - 01/29 0700 In: 1198.6 [P.O.:930; I.V.:268.6] Out: 1400 [Urine:1400] Intake/Output this shift: No intake/output data recorded.  General appearance: alert, cooperative and no distress Heart: regular rate and rhythm and occ extrasystole Lungs: clear to auscultation bilaterally Abdomen: benign Extremities: + BLE edema Wound: incis healing well  Lab Results: Recent Labs    03/09/17 0313 03/10/17 0513  WBC 14.2* 12.1*  HGB 9.5* 9.4*  HCT 28.6* 28.6*  PLT 148* 170   BMET:  Recent Labs    03/10/17 0513 03/11/17 0341  NA 132* 134*  K 4.1 3.7  CL 99* 101  CO2 22 22  GLUCOSE 151* 165*  BUN 49* 60*  CREATININE 1.97* 2.19*  CALCIUM 8.3* 8.2*    PT/INR:  Recent Labs    03/09/17 0313  LABPROT 16.4*  INR 1.33   ABG    Component Value Date/Time   PHART 7.311 (L) 03/07/2017 1813   HCO3 21.6 03/07/2017 1813   TCO2 22 03/08/2017 1626   ACIDBASEDEF 5.0 (H) 03/07/2017 1813   O2SAT 98.0 03/07/2017 1813   CBG (last 3)  Recent Labs    03/10/17 1556 03/10/17 2127 03/11/17 0611  GLUCAP 190* 213* 137*     Meds Scheduled Meds: . acetaminophen  1,000 mg Oral Q6H   Or  . acetaminophen (TYLENOL) oral liquid 160 mg/5 mL  1,000 mg Per Tube Q6H  . amiodarone  400 mg Oral BID  . aspirin EC  325 mg Oral Daily   Or  . aspirin  324 mg Per Tube Daily  . bisacodyl  10 mg Oral Daily   Or  . bisacodyl  10 mg Rectal Daily  . Chlorhexidine Gluconate Cloth  6 each Topical Daily  . docusate sodium  200 mg Oral Daily  . enoxaparin (LOVENOX) injection  40 mg Subcutaneous Q24H  . feeding supplement (GLUCERNA SHAKE)  237 mL Oral TID WC  . furosemide  80 mg Intravenous Daily  . glipiZIDE  10 mg Oral Q breakfast  . insulin aspart  0-24 Units Subcutaneous TID AC & HS  . metoCLOPramide (REGLAN) injection  10 mg Intravenous Q6H  . metoprolol tartrate  25 mg Oral BID  . pantoprazole  40 mg Oral Daily  . sodium chloride flush  10-40 mL Intracatheter Q12H  . sodium chloride flush  3 mL Intravenous Q12H  . sodium chloride flush  3 mL Intravenous Q12H  . tamsulosin  0.4 mg Oral Daily   Continuous Infusions: . sodium chloride Stopped (03/08/17 0800)  . sodium chloride    .  sodium chloride Stopped (03/08/17 0800)  . sodium chloride    . sodium chloride     PRN Meds:.sodium chloride, sodium chloride, magnesium hydroxide, metoprolol tartrate, ondansetron (ZOFRAN) IV, oxyCODONE, sodium chloride flush, sodium chloride flush, sodium chloride flush, traMADol  Xrays Dg Chest 2 View  Result Date: 03/10/2017 CLINICAL DATA:  Coronary bypass EXAM: CHEST  2 VIEW COMPARISON:  03/09/2017 FINDINGS: Mediastinal drain, left chest tube, and right IJ vascular sheath all removed. Stable cardiomegaly with improving basilar atelectasis. Trace pleural effusions. No new collapse or consolidation. No pneumothorax. Trachea is midline. IMPRESSION: Stable cardiomegaly with vascular congestion and basilar atelectasis. Trace pleural effusions. Electronically Signed   By: Jerilynn Mages.  Shick M.D.   On: 03/10/2017 08:03    Assessment/Plan: S/P  Procedure(s) (LRB): CORONARY ARTERY BYPASS GRAFTING (CABG), X 3 , USING RIGHT INTERNAL MAMMARY ARTERY, AND RIGHT LEG GREATER SAPHENOUS VEIN HARVESTED ENDOSCOPICALLY (N/A) TRANSESOPHAGEAL ECHOCARDIOGRAM (TEE) (N/A)  1 doing well, monitor rhythm , replace K+, check TSH - change to po amio, poss restart apixaban? 2 sugar control fair,creat up 2.1, hold on starting additional agent for now 3 reduce lasix to 40 po daily, repeat BMET in am 4 d/c wires soon 5 wean off O2 6 Routine pulm toilet/cardiac rehab 7 poss d/c home 1-2 days   LOS: 4 days    John Giovanni 03/11/2017 patient examined and medical record reviewed,agree with above note. Tharon Aquas Trigt III 03/11/2017

## 2017-03-12 LAB — GLUCOSE, CAPILLARY
Glucose-Capillary: 102 mg/dL — ABNORMAL HIGH (ref 65–99)
Glucose-Capillary: 136 mg/dL — ABNORMAL HIGH (ref 65–99)
Glucose-Capillary: 158 mg/dL — ABNORMAL HIGH (ref 65–99)
Glucose-Capillary: 254 mg/dL — ABNORMAL HIGH (ref 65–99)
Glucose-Capillary: 266 mg/dL — ABNORMAL HIGH (ref 65–99)
Glucose-Capillary: 200 mg/dL — ABNORMAL HIGH (ref 65–99)

## 2017-03-12 LAB — BASIC METABOLIC PANEL
Anion gap: 12 (ref 5–15)
BUN: 62 mg/dL — AB (ref 6–20)
CHLORIDE: 97 mmol/L — AB (ref 101–111)
CO2: 22 mmol/L (ref 22–32)
CREATININE: 2.03 mg/dL — AB (ref 0.61–1.24)
Calcium: 8.3 mg/dL — ABNORMAL LOW (ref 8.9–10.3)
GFR calc Af Amer: 38 mL/min — ABNORMAL LOW (ref >60)
GFR, EST NON AFRICAN AMERICAN: 33 mL/min — AB (ref >60)
Glucose, Bld: 99 mg/dL (ref 65–99)
Potassium: 3.7 mmol/L (ref 3.5–5.1)
SODIUM: 131 mmol/L — AB (ref 135–145)

## 2017-03-12 MED ORDER — SORBITOL 70 % PO SOLN
30.0000 mL | Freq: Once | ORAL | Status: AC
  Administered 2017-03-13: 30 mL via ORAL
  Filled 2017-03-12: qty 30

## 2017-03-12 MED ORDER — METOPROLOL TARTRATE 12.5 MG HALF TABLET: 12.5000 mg | ORAL_TABLET | Freq: Two times a day (BID) | ORAL | Status: DC

## 2017-03-12 MED ORDER — POTASSIUM CHLORIDE CRYS ER 20 MEQ PO TBCR
40.0000 meq | EXTENDED_RELEASE_TABLET | Freq: Every day | ORAL | Status: AC
  Administered 2017-03-12: 40 meq via ORAL
  Filled 2017-03-12: qty 2

## 2017-03-12 NOTE — Progress Notes (Signed)
Patient ambulated in the hall using a rollator after walking about 143ft  He  c/o of being dizzy and diaphoretic, he was assisted to a chair and wheeled back to the room. On getting to the room patient was assisted to the bed, he said he "feels ok". Patient was advise  to always ask for help before getting up from the bed to prevent fall,  he verbalised understanding call bell within reach , bed in lowest position, patient's wife at bedside will continue to monitor

## 2017-03-12 NOTE — Discharge Instructions (Signed)
Endoscopic Saphenous Vein Harvesting, Care After °Refer to this sheet in the next few weeks. These instructions provide you with information about caring for yourself after your procedure. Your health care provider may also give you more specific instructions. Your treatment has been planned according to current medical practices, but problems sometimes occur. Call your health care provider if you have any problems or questions after your procedure. °What can I expect after the procedure? °After the procedure, it is common to have: °· Pain. °· Bruising. °· Swelling. °· Numbness. ° °Follow these instructions at home: °Medicine °· Take over-the-counter and prescription medicines only as told by your health care provider. °· Do not drive or operate heavy machinery while taking prescription pain medicine. °Incision care ° °· Follow instructions from your health care provider about how to take care of the cut made during surgery (incision). Make sure you: °? Wash your hands with soap and water before you change your bandage (dressing). If soap and water are not available, use hand sanitizer. °? Change your dressing as told by your health care provider. °? Leave stitches (sutures), skin glue, or adhesive strips in place. These skin closures may need to be in place for 2 weeks or longer. If adhesive strip edges start to loosen and curl up, you may trim the loose edges. Do not remove adhesive strips completely unless your health care provider tells you to do that. °· Check your incision area every day for signs of infection. Check for: °? More redness, swelling, or pain. °? More fluid or blood. °? Warmth. °? Pus or a bad smell. °General instructions °· Raise (elevate) your legs above the level of your heart while you are sitting or lying down. °· Do any exercises your health care providers have given you. These may include deep breathing, coughing, and walking exercises. °· Do not shower, take baths, swim, or use a hot tub  unless told by your health care provider. °· Wear your elastic stocking if told by your health care provider. °· Keep all follow-up visits as told by your health care provider. This is important. °Contact a health care provider if: °· Medicine does not help your pain. °· Your pain gets worse. °· You have new leg bruises or your leg bruises get bigger. °· You have a fever. °· Your leg feels numb. °· You have more redness, swelling, or pain around your incision. °· You have more fluid or blood coming from your incision. °· Your incision feels warm to the touch. °· You have pus or a bad smell coming from your incision. °Get help right away if: °· Your pain is severe. °· You develop pain, tenderness, warmth, redness, or swelling in any part of your leg. °· You have chest pain. °· You have trouble breathing. °This information is not intended to replace advice given to you by your health care provider. Make sure you discuss any questions you have with your health care provider. °Document Released: 10/10/2010 Document Revised: 07/06/2015 Document Reviewed: 12/12/2014 °Elsevier Interactive Patient Education © 2018 Else[employer] Inc. °Coronary Artery Bypass Grafting, Care After °These instructions give you information on caring for yourself after your procedure. Your doctor may also give you more specific instructions. Call your doctor if you have any problems or questions after your procedure. °Follow these instructions at home: °· Only take medicine as told by your doctor. Take medicines exactly as told. Do not stop taking medicines or start any new medicines without talking to your doctor first. °·   Take your pulse as told by your doctor.  Do deep breathing as told by your doctor. Use your breathing device (incentive spirometer), if given, to practice deep breathing several times a day. Support your chest with a pillow or your arms when you take deep breaths or cough.  Keep the area clean, dry, and protected where the  surgery cuts (incisions) were made. Remove bandages (dressings) only as told by your doctor. If strips were applied to surgical area, do not take them off. They fall off on their own.  Check the surgery area daily for puffiness (swelling), redness, or leaking fluid.  If surgery cuts were made in your legs: ? Avoid crossing your legs. ? Avoid sitting for long periods of time. Change positions every 30 minutes. ? Raise your legs when you are sitting. Place them on pillows.  Wear stockings that help keep blood clots from forming in your legs (compression stockings).  Only take sponge baths until your doctor says it is okay to take showers. Pat the surgery area dry. Do not rub the surgery area with a washcloth or towel. Do not bathe, swim, or use a hot tub until your doctor says it is okay.  Eat foods that are high in fiber. These include raw fruits and vegetables, whole grains, beans, and nuts. Choose lean meats. Avoid canned, processed, and fried foods.  Drink enough fluids to keep your pee (urine) clear or pale yellow.  Weigh yourself every day.  Rest and limit activity as told by your doctor. You may be told to: ? Stop any activity if you have chest pain, shortness of breath, changes in heartbeat, or dizziness. Get help right away if this happens. ? Move around often for short amounts of time or take short walks as told by your doctor. Gradually become more active. You may need help to strengthen your muscles and build endurance. ? Avoid lifting, pushing, or pulling anything heavier than 10 pounds (4.5 kg) for at least 6 weeks after surgery.  Do not drive until your doctor says it is okay.  Ask your doctor when you can go back to work.  Ask your doctor when you can begin sexual activity again.  Follow up with your doctor as told. Contact a doctor if:  You have puffiness, redness, more pain, or fluid draining from the incision site.  You have a fever.  You have puffiness in your  ankles or legs.  You have pain in your legs.  You gain 2 or more pounds (0.9 kg) a day.  You feel sick to your stomach (nauseous) or throw up (vomit).  You have watery poop (diarrhea). Get help right away if:  You have chest pain that goes to your jaw or arms.  You have shortness of breath.  You have a fast or irregular heartbeat.  You notice a "clicking" in your breastbone when you move.  You have numbness or weakness in your arms or legs.  You feel dizzy or light-headed. This information is not intended to replace advice given to you by your health care provider. Make sure you discuss any questions you have with your health care provider. Document Released: 02/02/2013 Document Revised: 07/06/2015 Document Reviewed: 07/07/2012 Elsevier Interactive Patient Education  2017 [place] on my medicine - ELIQUIS (apixaban)  This medication education was reviewed with me or my healthcare representative as part of my discharge preparation.  The pharmacist that spoke with me during my hospital stay was:  Alphonse Guild  [place], [place]  Why was Eliquis prescribed for you? Eliquis was prescribed for you to reduce the risk of forming blood clots that can cause a stroke if you have a medical condition called atrial fibrillation (a type of irregular heartbeat) OR to reduce the risk of a blood clots forming after orthopedic surgery.  What do You need to know about Eliquis ? Take your Eliquis TWICE DAILY - one tablet in the morning and one tablet in the evening with or without food.  It would be best to take the doses about the same time each day.  If you have difficulty swallowing the tablet whole please discuss with your pharmacist how to take the medication safely.  Take Eliquis exactly as prescribed by your doctor and DO NOT stop taking Eliquis without talking to the doctor who prescribed the medication.  Stopping may increase your risk of developing a new clot or stroke.   Refill your prescription before you run out.  After discharge, you should have regular check-up appointments with your healthcare provider that is prescribing your Eliquis.  In the future your dose may need to be changed if your kidney function or weight changes by a significant amount or as you get older.  What do you do if you miss a dose? If you miss a dose, take it as soon as you remember on the same day and resume taking twice daily.  Do not take more than one dose of ELIQUIS at the same time.  Important Safety Information A possible side effect of Eliquis is bleeding. You should call your healthcare provider right away if you experience any of the following: ? Bleeding from an injury or your nose that does not stop. ? Unusual colored urine (red or dark brown) or unusual colored stools (red or black). ? Unusual bruising for unknown reasons. ? A serious fall or if you hit your head (even if there is no bleeding).  Some medicines may interact with Eliquis and might increase your risk of bleeding or clotting while on Eliquis. To help avoid this, consult your healthcare provider or pharmacist prior to using any new prescription or non-prescription medications, including herbals, vitamins, non-steroidal anti-inflammatory drugs (NSAIDs) and supplements.  This website has more information on Eliquis (apixaban): [email or web address].no.

## 2017-03-12 NOTE — Progress Notes (Signed)
This RN was informed by another RN that the patient became very diaphoretic and dizzy while attempting to ambulate with a staff, BP dropped from BP  115/53 HR 68  to BP 81/46 HR 72. While lying down in bed BP increased to 126/82 HR 69. CBG was 158. O2 100% on room air. Patient now resting quietly in the bed,denies chest pain, skin warm and dry, no sign of distress noted patient stated that ''I feel ok now" patient's wife stated that the patient takes 12.5mg  of metoprolol daily at home instead of 25mg  BID, Jadene Pierini PA notified, verbal order received for metoprolol 12.5mg  by mouth  BID. Will continue to monitor.

## 2017-03-12 NOTE — Progress Notes (Signed)
Patient education completed for removal of pacing wires, patient verbalised understanding, 4 pacing wires removed as ordered, procedure well tolerated, patient now on bedrest will continue to monitor.

## 2017-03-12 NOTE — Progress Notes (Signed)
Patient ambulated 560 feet in the hall using a rollator on room air , ambulation well tolerated will continue to monitor.

## 2017-03-12 NOTE — Progress Notes (Signed)
CARDIAC REHAB PHASE I   PRE:  Rate/Rhythm: 21 SR with PVCs    BP: sitting 115/53    SaO2: 96 RA  MODE:  Ambulation: to door   POST:  Rate/Rhythm: 76 SR    BP: sitting 116/98     SaO2: 95 RA    Stood and BP down to 81/46, back in bed lying 126/82   Pt in recliner. Stood to walk however at door of room felt dizzy, diaphoretic. Turned around and walked back to bed however pt slow to move. On EOB pt BP stable, felt better. CBG ok. Stood pt again after a few minutes and BP down to 81/46. Put pt back in bed to rest. Encouraged PO intake, standing 1 min before walking. Pt has h/o being sensitive to metoprolol and sts he found that he could only take 12.5 (on 25 mg now). Will f/u in am. He will benefit from bariatric rollator for home. Encouraged walking with RN later. [place], ACSM 03/12/2017 2:16 PM

## 2017-03-12 NOTE — Progress Notes (Addendum)
[place]       [employer] [address]             [phone]      5 Days Post-Op Procedure(s) (LRB): CORONARY ARTERY BYPASS GRAFTING (CABG), X 3 , USING RIGHT INTERNAL MAMMARY ARTERY, AND RIGHT LEG GREATER SAPHENOUS VEIN HARVESTED ENDOSCOPICALLY (N/A) TRANSESOPHAGEAL ECHOCARDIOGRAM (TEE) (N/A) Subjective: Feels fairly well  Objective: Vital signs in last 24 hours: Temp:  [97.5 F (36.4 C)-99.5 F (37.5 C)] 97.5 F (36.4 C) (01/30 0445) Pulse Rate:  [63-73] 63 (01/29 2341) Cardiac Rhythm: Normal sinus rhythm;Bundle branch block (01/29 2011) Resp:  [18-26] 19 (01/30 0445) BP: (109-146)/(67-79) 146/79 (01/30 0445) SpO2:  [94 %-99 %] 99 % (01/30 0445) Weight:  [296 lb 4.8 oz (134.4 kg)] 296 lb 4.8 oz (134.4 kg) (01/30 0450)  Hemodynamic parameters for last 24 hours:    Intake/Output from previous day: 01/29 0701 - 01/30 0700 In: 630 [P.O.:630] Out: 2480 [Urine:2480] Intake/Output this shift: No intake/output data recorded.  General appearance: alert, cooperative and no distress Heart: regular rate and rhythm and occ extrasystole Lungs: clear to auscultation bilaterally Abdomen: benign Extremities: edema improving Wound: incis healing well  Lab Results: Recent Labs    03/10/17 0513  WBC 12.1*  HGB 9.4*  HCT 28.6*  PLT 170   BMET:  Recent Labs    03/11/17 0341 03/12/17 0255  NA 134* 131*  K 3.7 3.7  CL 101 97*  CO2 22 22  GLUCOSE 165* 99  BUN 60* 62*  CREATININE 2.19* 2.03*  CALCIUM 8.2* 8.3*    PT/INR: No results for input(s): LABPROT, INR in the last 72 hours. ABG    Component Value Date/Time   PHART 7.311 (L) 03/07/2017 1813   HCO3 21.6 03/07/2017 1813   TCO2 22 03/08/2017 1626   ACIDBASEDEF 5.0 (H) 03/07/2017 1813   O2SAT 98.0 03/07/2017 1813   CBG (last 3)  Recent Labs    03/11/17 1623 03/11/17 2100 03/12/17 0616  GLUCAP 138* 139* 102*    Meds Scheduled Meds: . acetaminophen  1,000 mg Oral Q6H   Or  .  acetaminophen (TYLENOL) oral liquid 160 mg/5 mL  1,000 mg Per Tube Q6H  . amiodarone  400 mg Oral BID  . aspirin EC  325 mg Oral Daily   Or  . aspirin  324 mg Per Tube Daily  . bisacodyl  10 mg Oral Daily   Or  . bisacodyl  10 mg Rectal Daily  . Chlorhexidine Gluconate Cloth  6 each Topical Daily  . docusate sodium  200 mg Oral Daily  . enoxaparin (LOVENOX) injection  40 mg Subcutaneous Q24H  . feeding supplement (GLUCERNA SHAKE)  237 mL Oral TID WC  . furosemide  40 mg Oral Daily  . glipiZIDE  10 mg Oral Q breakfast  . insulin aspart  0-24 Units Subcutaneous TID AC & HS  . metoCLOPramide (REGLAN) injection  10 mg Intravenous Q6H  . metoprolol tartrate  25 mg Oral BID  . pantoprazole  40 mg Oral Daily  . sodium chloride flush  10-40 mL Intracatheter Q12H  . sodium chloride flush  3 mL Intravenous Q12H  . sodium chloride flush  3 mL Intravenous Q12H  . tamsulosin  0.4 mg Oral Daily   Continuous Infusions: . sodium chloride Stopped (03/08/17 0800)  . sodium chloride    . sodium chloride Stopped (03/08/17 0800)  . sodium chloride    . sodium chloride  PRN Meds:.sodium chloride, sodium chloride, magnesium hydroxide, metoprolol tartrate, ondansetron (ZOFRAN) IV, oxyCODONE, sodium chloride flush, sodium chloride flush, sodium chloride flush, traMADol  Xrays Dg Chest 2 View  Result Date: 03/10/2017 CLINICAL DATA:  Coronary bypass EXAM: CHEST  2 VIEW COMPARISON:  03/09/2017 FINDINGS: Mediastinal drain, left chest tube, and right IJ vascular sheath all removed. Stable cardiomegaly with improving basilar atelectasis. Trace pleural effusions. No new collapse or consolidation. No pneumothorax. Trachea is midline. IMPRESSION: Stable cardiomegaly with vascular congestion and basilar atelectasis. Trace pleural effusions. Electronically Signed   By: Jerilynn Mages.  Shick M.D.   On: 03/10/2017 08:03    Assessment/Plan: S/P Procedure(s) (LRB): CORONARY ARTERY BYPASS GRAFTING (CABG), X 3 , USING RIGHT  INTERNAL MAMMARY ARTERY, AND RIGHT LEG GREATER SAPHENOUS VEIN HARVESTED ENDOSCOPICALLY (N/A) TRANSESOPHAGEAL ECHOCARDIOGRAM (TEE) (N/A)   1 doing well overall 2 creat improved, cont gentle diuresis, monitor sodium 3 rhythm stable, sinus with pvc's , replace K+ - ? Restart apixaban 4 sugars fair control. Discussed DM management and dietary changes . Appetite is currently fairly poor 5 d/c wires today 7 routine pulm toilet, rehab 8 poss home later today vs tomorrow     LOS: 5 days    John Giovanni 03/12/2017 Reduce metoprolol to 12.5 twice a day because of history of orthostatic dizziness presyncope with this medicine  Plan discharged home on Thursday patient examined and medical record reviewed,agree with above note. Tharon Aquas Trigt III 03/12/2017

## 2017-03-13 ENCOUNTER — Encounter: Payer: Self-pay | Admitting: Cardiovascular Disease

## 2017-03-13 LAB — CBC
HCT: 31.2 % — ABNORMAL LOW (ref 39.0–52.0)
Hemoglobin: 10.6 g/dL — ABNORMAL LOW (ref 13.0–17.0)
MCH: 30.5 pg (ref 26.0–34.0)
MCHC: 34 g/dL (ref 30.0–36.0)
MCV: 89.7 fL (ref 78.0–100.0)
Platelets: 210 10*3/uL (ref 150–400)
RBC: 3.48 MIL/uL — ABNORMAL LOW (ref 4.22–5.81)
RDW: 13.5 % (ref 11.5–15.5)
WBC: 8.9 10*3/uL (ref 4.0–10.5)

## 2017-03-13 LAB — BASIC METABOLIC PANEL
Anion gap: 12 (ref 5–15)
BUN: 57 mg/dL — ABNORMAL HIGH (ref 6–20)
CO2: 21 mmol/L — ABNORMAL LOW (ref 22–32)
Calcium: 8.5 mg/dL — ABNORMAL LOW (ref 8.9–10.3)
Chloride: 99 mmol/L — ABNORMAL LOW (ref 101–111)
Creatinine, Ser: 1.78 mg/dL — ABNORMAL HIGH (ref 0.61–1.24)
GFR calc Af Amer: 44 mL/min — ABNORMAL LOW (ref >60)
GFR calc non Af Amer: 38 mL/min — ABNORMAL LOW (ref >60)
Glucose, Bld: 120 mg/dL — ABNORMAL HIGH (ref 65–99)
Potassium: 4.3 mmol/L (ref 3.5–5.1)
Sodium: 132 mmol/L — ABNORMAL LOW (ref 135–145)

## 2017-03-13 LAB — GLUCOSE, CAPILLARY
Glucose-Capillary: 141 mg/dL — ABNORMAL HIGH (ref 65–99)
Glucose-Capillary: 192 mg/dL — ABNORMAL HIGH (ref 65–99)

## 2017-03-13 MED ORDER — OXYCODONE HCL 5 MG PO TABS: 5.0000 mg | ORAL_TABLET | Freq: Four times a day (QID) | ORAL | 0 refills | Status: DC | PRN

## 2017-03-13 MED ORDER — AMIODARONE HCL 200 MG PO TABS: 200.0000 mg | ORAL_TABLET | Freq: Two times a day (BID) | ORAL | 1 refills | Status: DC

## 2017-03-13 MED FILL — Sodium Chloride IV Soln 0.9%: INTRAVENOUS | Qty: 2000 | Status: AC

## 2017-03-13 MED FILL — Sodium Bicarbonate IV Soln 8.4%: INTRAVENOUS | Qty: 150 | Status: AC

## 2017-03-13 MED FILL — Lidocaine HCl IV Inj 20 MG/ML: INTRAVENOUS | Qty: 5 | Status: AC

## 2017-03-13 MED FILL — Mannitol IV Soln 20%: INTRAVENOUS | Qty: 500 | Status: AC

## 2017-03-13 MED FILL — Electrolyte-R (PH 7.4) Solution: INTRAVENOUS | Qty: 4000 | Status: AC

## 2017-03-13 MED FILL — Heparin Sodium (Porcine) Inj 1000 Unit/ML: INTRAMUSCULAR | Qty: 20 | Status: AC

## 2017-03-13 NOTE — Progress Notes (Signed)
Discharge instructions given to pt and wife/ Chest tube sutures removed and steri- strips placed. Iv removed, clean and intact. Telemetry removed. Tub bencha nd rollator delivered and sent home with patient.  Clyde Canterbury, RN

## 2017-03-13 NOTE — Progress Notes (Signed)
[place] [address]       [employer] [address]             [phone]      6 Days Post-Op Procedure(s) (LRB): CORONARY ARTERY BYPASS GRAFTING (CABG), X 3 , USING RIGHT INTERNAL MAMMARY ARTERY, AND RIGHT LEG GREATER SAPHENOUS VEIN HARVESTED ENDOSCOPICALLY (N/A) TRANSESOPHAGEAL ECHOCARDIOGRAM (TEE) (N/A) Subjective: Feels better today , some orthostasis yesterday  Objective: Vital signs in last 24 hours: Temp:  [97.6 F (36.4 C)-98.8 F (37.1 C)] 98.3 F (36.8 C) (01/31 0412) Pulse Rate:  [65-67] 65 (01/30 1953) Cardiac Rhythm: Normal sinus rhythm (01/30 2029) Resp:  [16-24] 16 (01/31 0412) BP: (81-138)/(46-82) 127/68 (01/31 0412) SpO2:  [96 %-100 %] 98 % (01/31 0412) Weight:  [290 lb 14.4 oz (132 kg)] 290 lb 14.4 oz (132 kg) (01/31 0412)  Hemodynamic parameters for last 24 hours:    Intake/Output from previous day: 01/30 0701 - 01/31 0700 In: 1080 [P.O.:1080] Out: 2560 [Urine:2560] Intake/Output this shift: No intake/output data recorded.  General appearance: alert, cooperative and no distress Heart: regular rate and rhythm Lungs: clear to auscultation bilaterally Abdomen: benign Extremities: min edema Wound: incis healing well  Lab Results: Recent Labs    03/13/17 0421  WBC 8.9  HGB 10.6*  HCT 31.2*  PLT 210   BMET:  Recent Labs    03/12/17 0255 03/13/17 0421  NA 131* 132*  K 3.7 4.3  CL 97* 99*  CO2 22 21*  GLUCOSE 99 120*  BUN 62* 57*  CREATININE 2.03* 1.78*  CALCIUM 8.3* 8.5*    PT/INR: No results for input(s): LABPROT, INR in the last 72 hours. ABG    Component Value Date/Time   PHART 7.311 (L) 03/07/2017 1813   HCO3 21.6 03/07/2017 1813   TCO2 22 03/08/2017 1626   ACIDBASEDEF 5.0 (H) 03/07/2017 1813   O2SAT 98.0 03/07/2017 1813   CBG (last 3)  Recent Labs    03/12/17 1843 03/12/17 2112 03/13/17 0641  GLUCAP 266* 200* 141*    Meds Scheduled Meds: . amiodarone  400 mg Oral BID  . aspirin EC  325 mg Oral Daily     Or  . aspirin  324 mg Per Tube Daily  . bisacodyl  10 mg Oral Daily   Or  . bisacodyl  10 mg Rectal Daily  . Chlorhexidine Gluconate Cloth  6 each Topical Daily  . docusate sodium  200 mg Oral Daily  . enoxaparin (LOVENOX) injection  40 mg Subcutaneous Q24H  . feeding supplement (GLUCERNA SHAKE)  237 mL Oral TID WC  . furosemide  40 mg Oral Daily  . glipiZIDE  10 mg Oral Q breakfast  . insulin aspart  0-24 Units Subcutaneous TID AC & HS  . pantoprazole  40 mg Oral Daily  . sodium chloride flush  10-40 mL Intracatheter Q12H  . sodium chloride flush  3 mL Intravenous Q12H  . sodium chloride flush  3 mL Intravenous Q12H  . sorbitol  30 mL Oral Once   Continuous Infusions: . sodium chloride Stopped (03/08/17 0800)  . sodium chloride    . sodium chloride Stopped (03/08/17 0800)  . sodium chloride    . sodium chloride     PRN Meds:.sodium chloride, sodium chloride, magnesium hydroxide, ondansetron (ZOFRAN) IV, oxyCODONE, sodium chloride flush, sodium chloride flush, sodium chloride flush, traMADol  Xrays No results found.  Assessment/Plan: S/P Procedure(s) (LRB): CORONARY ARTERY BYPASS GRAFTING (CABG), X 3 , USING RIGHT INTERNAL MAMMARY  ARTERY, AND RIGHT LEG GREATER SAPHENOUS VEIN HARVESTED ENDOSCOPICALLY (N/A) TRANSESOPHAGEAL ECHOCARDIOGRAM (TEE) (N/A) Plan for discharge: see discharge orders Restart apixiban Stop diuretics Beta blocker reduced yesterday   LOS: 6 days    John Giovanni 03/13/2017

## 2017-03-13 NOTE — Progress Notes (Signed)
CARDIAC REHAB PHASE I   PRE:  Rate/Rhythm: 61 SR with PVC    BP: sitting 129/70, standing 107/60    SaO2:   MODE:  Ambulation: 470 ft   POST:  Rate/Rhythm: 83 SR with PVCs    BP: sitting 162/79     SaO2:   Pt feeling better. Stood for 1 min and took BP before walking. Used rollator, no problems. Discussed with pt and wife standing for 1 min before walking at home. He understands. Ed completed with pt and wife. Good reception. Will send referral to [facility].  [phone]   [place], ACSM 03/13/2017 9:56 AM

## 2017-03-13 NOTE — Care Management Note (Signed)
Case Management Note Marvetta Gibbons RN, BSN Unit 4E-Case Manager [phone]  Patient Details  Name: Travis Blair MRN: [id number] Date of Birth: 09/04/1950  Subjective/Objective: Pt admitted s/p CABG x3                 Action/Plan: PTA pt lived at home with spouse- independent- anticipate return home- order placed for [facility]- spoke with pt and wife at bedside regarding transition needs- choice offered for [facility] agency- per choice they would like to use [facility]- discussed DME needs pt would like rollator for home and tub bench- explained tub bench would be an out of pocket expense-(insurance does not cover)- pt and wife would like to go ahead and get both DME here from hospital with understanding that they will have to pay out of pocket for tub bench- referral for [facility] and DME needs called to Butch Penny with [facility]- rollator and tub bench to be delivered to room prior to discharge. - CM will continue to follow for any further transition needs.   Expected Discharge Date:  03/13/17               Expected Discharge Plan:  [place]  In-House Referral:  NA  Discharge planning Services  CM Consult  Post Acute Care Choice:  Durable Medical Equipment, Home Health Choice offered to:  Patient, Spouse  DME Arranged:  Tub bench, Walker rolling with seat DME Agency:  [place]:  RN [facility] Agency:  [place]  Status of Service:  Completed, signed off  If discussed at [place] of Stay Meetings, dates discussed:    Discharge Disposition: home/home health   Additional Comments:  03/13/17- Carlyss RN, CM- pt for d/c home with wife today- HH has been arranged with [facility]- notified Butch Penny with [facility] for DME delivery- to be delivered to room prior to discharge- spoke with pt and wife- to let them know DME will be brought to room.   Dawayne Patricia, RN 03/13/2017, 10:04 AM

## 2017-03-14 ENCOUNTER — Telehealth: Payer: Self-pay

## 2017-03-14 DIAGNOSIS — Z48812 Encounter for surgical aftercare following surgery on the circulatory system: Secondary | ICD-10-CM | POA: Diagnosis not present

## 2017-03-14 DIAGNOSIS — Z951 Presence of aortocoronary bypass graft: Secondary | ICD-10-CM | POA: Diagnosis not present

## 2017-03-14 DIAGNOSIS — Z7901 Long term (current) use of anticoagulants: Secondary | ICD-10-CM | POA: Diagnosis not present

## 2017-03-14 DIAGNOSIS — Z7982 Long term (current) use of aspirin: Secondary | ICD-10-CM | POA: Diagnosis not present

## 2017-03-14 DIAGNOSIS — E1169 Type 2 diabetes mellitus with other specified complication: Secondary | ICD-10-CM | POA: Diagnosis not present

## 2017-03-14 DIAGNOSIS — Z7984 Long term (current) use of oral hypoglycemic drugs: Secondary | ICD-10-CM | POA: Diagnosis not present

## 2017-03-14 DIAGNOSIS — E785 Hyperlipidemia, unspecified: Secondary | ICD-10-CM | POA: Diagnosis not present

## 2017-03-14 DIAGNOSIS — E669 Obesity, unspecified: Secondary | ICD-10-CM | POA: Diagnosis not present

## 2017-03-14 DIAGNOSIS — I2510 Atherosclerotic heart disease of native coronary artery without angina pectoris: Secondary | ICD-10-CM | POA: Diagnosis not present

## 2017-03-14 DIAGNOSIS — E118 Type 2 diabetes mellitus with unspecified complications: Secondary | ICD-10-CM | POA: Diagnosis not present

## 2017-03-14 NOTE — Telephone Encounter (Signed)
Verdis Frederickson with [facility] called to stated Mr. Travis Blair could benefit having PT and OT after surgery which was verbally ordered.  Also, stated that his midline sternal incision was "slightly red" no fever or drainage or smell.  Advised to keep an eye on it and let us know if anything changes, she verbalized receipt. No further questions asked.

## 2017-03-14 NOTE — Telephone Encounter (Signed)
Patient scheduled for ov with Dr. Acie Fredrickson on 2/13 and is in agreement with this date and time.

## 2017-03-17 ENCOUNTER — Ambulatory Visit: Payer: PPO | Admitting: Endocrinology

## 2017-03-17 ENCOUNTER — Telehealth (HOSPITAL_COMMUNITY): Payer: Self-pay

## 2017-03-17 NOTE — Telephone Encounter (Signed)
Patients insurance is active and benefits verified through [employer] - $15.00 co-pay, no deductible, out of pocket amount of $3,400/$0.00 has been met, no co-insurance, and no pre-authorization is required. Spoke with [employer] - Reference #[id number] ° °Patient will be contacted and scheduled after their follow up appt with the Cardiologist office upon review by the RN Navigator. °

## 2017-03-18 ENCOUNTER — Encounter: Payer: Self-pay | Admitting: Cardiothoracic Surgery

## 2017-03-26 ENCOUNTER — Encounter: Payer: Self-pay | Admitting: Cardiovascular Disease

## 2017-03-26 ENCOUNTER — Ambulatory Visit: Payer: PPO | Admitting: Cardiovascular Disease

## 2017-03-26 VITALS — BP 120/76 | HR 73 | Ht 74.0 in | Wt 300.0 lb

## 2017-03-26 DIAGNOSIS — I5032 Chronic diastolic (congestive) heart failure: Secondary | ICD-10-CM | POA: Diagnosis not present

## 2017-03-26 DIAGNOSIS — I2510 Atherosclerotic heart disease of native coronary artery without angina pectoris: Secondary | ICD-10-CM

## 2017-03-26 DIAGNOSIS — I480 Paroxysmal atrial fibrillation: Secondary | ICD-10-CM

## 2017-03-26 MED ORDER — POTASSIUM CHLORIDE ER 10 MEQ PO TBCR: 10.0000 meq | EXTENDED_RELEASE_TABLET | Freq: Every day | ORAL | 3 refills | Status: DC

## 2017-03-26 MED ORDER — AMIODARONE HCL 200 MG PO TABS: 200.0000 mg | ORAL_TABLET | Freq: Every day | ORAL | 3 refills | Status: DC

## 2017-03-26 MED ORDER — TORSEMIDE 20 MG PO TABS: 20.0000 mg | ORAL_TABLET | Freq: Every day | ORAL | 3 refills | Status: DC

## 2017-03-26 NOTE — Patient Instructions (Addendum)
Medication Instructions:  Your physician has recommended you make the following change in your medication:  DECREASE Amiodarone to 200 mg daily START Torsemide 20 mg once daily START Kdur (potassium chloride) 10 mEq once daily   Labwork: Your physician recommends that you return for lab work in: 3 weeks for basic metabolic panel on March 6 anytime between 7:30 am and 4:30 pm   Testing/Procedures: None Ordered   Follow-Up: Your physician recommends that you return for a follow-up appointment on April 16   If you need a refill on your cardiac medications before your next appointment, please call your pharmacy.   Thank you for choosing [facility]! Christen Bame, RN [phone]

## 2017-03-26 NOTE — Progress Notes (Addendum)
Cardiology Office Note:    Date:  03/26/2017   ID:  Travis Blair, DOB 1950-12-20, MRN [id number]  PCP:  Deland Pretty, MD  Cardiologist:  [name], MD    Referring MD: Deland Pretty, MD   Chief Complaint  Patient presents with  . Coronary Artery Disease    History of Present Illness:    Travis Blair is a 67 y.o. male with a hx of CAD, DM Seen with wife Travis Blair   Has had DOE with walking  -  Was placed on Imdur, Metoprolol  , passed out , Has CKD - Lisinopril was stopped.  Worse for the past month. Has a + stress test,   Cath shows severe CAD.   Has seen Dr. [name]  - recommnended nephrology consult.  Has not heard back from nephrologist   Has had swelling of left leg for 8 months .  No angina ( is diabetic)   February 26, 2017: Seen back today for follow-up of his coronary artery disease, diabetes mellitus, hypertension, and chronic renal insufficiency. He was found to have a DVT several months ago.  He was started on Eliquis. Has seen Dr. Ron Agee and is scheduled for CABG next Friday  BP is a bit high initially but was normal by the end of the visit   March 26, 2017:  Travis Blair is seen back today for follow-up of his coronary artery disease.  He had coronary artery bypass grafting in January, 2018.  Coronary artery bypass grafting x3 (left internal mammary artery to left anterior descending, saphenous vein graft to posterior descending, saphenous vein graft to circumflex marginal).  He had postoperative atrial fibrillation and was treated with amiodarone.  He was started back  on Eliquis.  Was on Eliquis for a DVT starting  Has had bilateral leg edema    Past Medical History:  Diagnosis Date  . Asthma    as a child  . Chronic kidney disease    Sees Dr [name]  . Coronary artery disease   . Diabetes mellitus without complication ([place])   . Hyperlipidemia   . Hypertension   . Myocardial infarction ([place])   . Obesity   . Pneumonia 2018  .  Sleep apnea     Past Surgical History:  Procedure Laterality Date  . COLONOSCOPY    . CORONARY ARTERY BYPASS GRAFT N/A 03/07/2017   Procedure: CORONARY ARTERY BYPASS GRAFTING (CABG), X 3 , USING RIGHT INTERNAL MAMMARY ARTERY, AND RIGHT LEG GREATER SAPHENOUS VEIN HARVESTED ENDOSCOPICALLY;  Surgeon: [name], MD;  Location: [facility];  Service: Open Heart Surgery;  Laterality: N/A;  . HAND SURGERY    . RIGHT/LEFT HEART CATH AND CORONARY ANGIOGRAPHY N/A 12/20/2016   Procedure: RIGHT/LEFT HEART CATH AND CORONARY ANGIOGRAPHY;  Surgeon: [name], MD;  Location: [place] [facility] CV LAB;  Service: Cardiovascular;  Laterality: N/A;  . TEE WITHOUT CARDIOVERSION N/A 03/07/2017   Procedure: TRANSESOPHAGEAL ECHOCARDIOGRAM (TEE);  Surgeon: [name], [name], MD;  Location: [place];  Service: Open Heart Surgery;  Laterality: N/A;  . TONSILLECTOMY      Current Medications: Current Meds  Medication Sig  . acetaminophen (TYLENOL) 500 MG tablet Take 1,000 mg daily as needed by mouth for moderate pain.  Marland Kitchen amiodarone (PACERONE) 200 MG tablet Take 1 tablet (200 mg total) by mouth 2 (two) times daily.  Marland Kitchen apixaban (ELIQUIS) 5 MG TABS tablet Take 1 tablet (5 mg total) by mouth 2 (two) times daily.  Marland Kitchen aspirin EC 81  MG tablet Take 81 mg by mouth daily.  Marland Kitchen glipiZIDE (GLUCOTROL XL) 10 MG 24 hr tablet TAKE 1 TABLET BY MOUTH EVERY MORNING  . metoprolol succinate (TOPROL-XL) 25 MG 24 hr tablet Take 0.5 tablets (12.5 mg total) by mouth every morning.  Marland Kitchen oxyCODONE (OXY IR/ROXICODONE) 5 MG immediate release tablet Take 1-2 tablets (5-10 mg total) by mouth every 6 (six) hours as needed for severe pain.     Allergies:   Patient has no known allergies.   Social History   Socioeconomic History  . Marital status: Married    Spouse name: None  . Number of children: None  . Years of education: None  . Highest education level: None  Social Needs  . Financial resource strain: None  . Food insecurity - worry: None    . Food insecurity - inability: None  . Transportation needs - medical: None  . Transportation needs - non-medical: None  Occupational History  . None  Tobacco Use  . Smoking status: Never Smoker  . Smokeless tobacco: Never Used  Substance and Sexual Activity  . Alcohol use: No  . Drug use: No  . Sexual activity: No  Other Topics Concern  . None  Social History Narrative  . None     Family History: The patient's family history includes Cancer in his father, mother, and other; Heart disease in his father, mother, and other; Hyperlipidemia in his other; Hypertension in his other; Lymphoma in his sister. ROS:   Please see the history of present illness.     All other systems reviewed and are negative.  EKGs/Labs/Other Studies Reviewed:    The following studies were reviewed today: Cath films, previous records.     Recent Labs: 03/04/2017: ALT 17 03/08/2017: Magnesium 2.6 03/11/2017: TSH 4.235 03/13/2017: BUN 57; Creatinine, Ser 1.78; Hemoglobin 10.6; Platelets 210; Potassium 4.3; Sodium 132  Recent Lipid Panel No results found for: CHOL, TRIG, HDL, CHOLHDL, VLDL, LDLCALC, LDLDIRECT   Physical Exam: Blood pressure 120/76, pulse 73, height 6\' 2"  (1.88 m), weight 300 lb (136.1 kg), SpO2 97 %.  GEN:  Well nourished, well developed in no acute distress HEENT: Normal NECK: No JVD; No carotid bruits LYMPHATICS: No lymphadenopathy CARDIAC: RR , sternotomy and thoracostomy sites are healing well. RESPIRATORY:  Clear to auscultation without rales, wheezing or rhonchi  ABDOMEN: , obese, non tender   MUSCULOSKELETAL:   1-2+ pitting edema bilaterally   SKIN: Warm and dry NEUROLOGIC:  Alert and oriented x 3   EKG:     ASSESSMENT:    No diagnosis found. PLAN:    In order of problems listed above:  1.  Coronary artery disease: He status post coronary artery bypass grafting.  He seems to be to be doing well following surgery.  He does have some volume retention.  We will  start him on torsemide 20 mg a day and potassium chloride 10 mg a day.  We will recheck a basic metabolic profile in 3 weeks.  2.   CKD -managed by Dr. [name].  3.  Paroxysmal atrial fibrillation: The patient had postoperative atrial fibrillation.  He is currently on amiodarone 200 mg twice a day.   He is been having some constipation which she attributes to the amiodarone.    We will reduce the amiodarone to 200 mg once a day. Anticipate stopping the amiodarone at the three-month mark.  3.  Hyperlipidemia: Continue current medications.  4.  DVT: The patient has been on Eliquis.  I will  defer to his primary medical doctor as to the duration of his Eliquis therapy but he might need it lifelong.   4.   DM -managed by his primary medical doctor.   Medication Adjustments/Labs and Tests Ordered: Current medicines are reviewed at length with the patient today.  Concerns regarding medicines are outlined above.  No orders of the defined types were placed in this encounter.  No orders of the defined types were placed in this encounter.   Signed, [name], MD  03/26/2017 11:16 AM    [place]

## 2017-03-30 ENCOUNTER — Encounter: Payer: Self-pay | Admitting: Cardiovascular Disease

## 2017-03-31 ENCOUNTER — Telehealth (HOSPITAL_COMMUNITY): Payer: Self-pay

## 2017-03-31 ENCOUNTER — Other Ambulatory Visit: Payer: Self-pay

## 2017-03-31 MED ORDER — POTASSIUM CHLORIDE ER 10 MEQ PO TBCR: 10.0000 meq | EXTENDED_RELEASE_TABLET | ORAL | 3 refills | Status: DC

## 2017-03-31 MED ORDER — TORSEMIDE 20 MG PO TABS: 20.0000 mg | ORAL_TABLET | ORAL | 3 refills | Status: DC

## 2017-03-31 NOTE — Telephone Encounter (Signed)
Called and made patient aware that Dr. Acie Fredrickson reviewed his MyChart message and that his recommendations are for him to hold his torsemide and k-dur for the next 2 days. Made patient aware that we will then have him take the torsemide and Kdur just twice a week after that. Also made patient aware that his blood sugars are not related and that he should follow up with his PCP regarding those issues. Patient verbalized understanding and thanked me for the call. Med list updated.

## 2017-03-31 NOTE — Telephone Encounter (Signed)
Attempted to call patient in regards to Cardiac Rehab - lm on vm °

## 2017-04-02 DIAGNOSIS — N179 Acute kidney failure, unspecified: Secondary | ICD-10-CM | POA: Diagnosis not present

## 2017-04-02 DIAGNOSIS — I129 Hypertensive chronic kidney disease with stage 1 through stage 4 chronic kidney disease, or unspecified chronic kidney disease: Secondary | ICD-10-CM | POA: Diagnosis not present

## 2017-04-02 DIAGNOSIS — E1122 Type 2 diabetes mellitus with diabetic chronic kidney disease: Secondary | ICD-10-CM | POA: Diagnosis not present

## 2017-04-04 DIAGNOSIS — E1129 Type 2 diabetes mellitus with other diabetic kidney complication: Secondary | ICD-10-CM | POA: Diagnosis not present

## 2017-04-04 DIAGNOSIS — I2510 Atherosclerotic heart disease of native coronary artery without angina pectoris: Secondary | ICD-10-CM | POA: Diagnosis not present

## 2017-04-04 DIAGNOSIS — K5909 Other constipation: Secondary | ICD-10-CM | POA: Diagnosis not present

## 2017-04-04 DIAGNOSIS — L84 Corns and callosities: Secondary | ICD-10-CM | POA: Diagnosis not present

## 2017-04-04 DIAGNOSIS — Z0000 Encounter for general adult medical examination without abnormal findings: Secondary | ICD-10-CM | POA: Diagnosis not present

## 2017-04-04 DIAGNOSIS — H9193 Unspecified hearing loss, bilateral: Secondary | ICD-10-CM | POA: Diagnosis not present

## 2017-04-04 DIAGNOSIS — Z1389 Encounter for screening for other disorder: Secondary | ICD-10-CM | POA: Diagnosis not present

## 2017-04-04 DIAGNOSIS — I10 Essential (primary) hypertension: Secondary | ICD-10-CM | POA: Diagnosis not present

## 2017-04-04 DIAGNOSIS — Z6837 Body mass index (BMI) 37.0-37.9, adult: Secondary | ICD-10-CM | POA: Diagnosis not present

## 2017-04-04 DIAGNOSIS — L988 Other specified disorders of the skin and subcutaneous tissue: Secondary | ICD-10-CM | POA: Diagnosis not present

## 2017-04-04 DIAGNOSIS — I5032 Chronic diastolic (congestive) heart failure: Secondary | ICD-10-CM | POA: Diagnosis not present

## 2017-04-04 DIAGNOSIS — I480 Paroxysmal atrial fibrillation: Secondary | ICD-10-CM | POA: Diagnosis not present

## 2017-04-08 ENCOUNTER — Other Ambulatory Visit: Payer: Self-pay | Admitting: Cardiothoracic Surgery

## 2017-04-08 DIAGNOSIS — Z951 Presence of aortocoronary bypass graft: Secondary | ICD-10-CM

## 2017-04-09 ENCOUNTER — Ambulatory Visit (INDEPENDENT_AMBULATORY_CARE_PROVIDER_SITE_OTHER): Payer: Self-pay | Admitting: Cardiothoracic Surgery

## 2017-04-09 ENCOUNTER — Ambulatory Visit
Admission: RE | Admit: 2017-04-09 | Discharge: 2017-04-09 | Disposition: A | Discharge status: Home / Self Care | Payer: PPO | Source: Ambulatory Visit | Attending: Thoracic Surgery (Cardiothoracic Vascular Surgery) | Admitting: Thoracic Surgery (Cardiothoracic Vascular Surgery)

## 2017-04-09 ENCOUNTER — Encounter: Payer: Self-pay | Admitting: Cardiothoracic Surgery

## 2017-04-09 ENCOUNTER — Other Ambulatory Visit: Payer: Self-pay | Admitting: Nephrology

## 2017-04-09 VITALS — BP 140/85 | HR 72 | Resp 20 | Ht 74.0 in | Wt 290.0 lb

## 2017-04-09 DIAGNOSIS — J90 Pleural effusion, not elsewhere classified: Secondary | ICD-10-CM | POA: Diagnosis not present

## 2017-04-09 DIAGNOSIS — N179 Acute kidney failure, unspecified: Secondary | ICD-10-CM

## 2017-04-09 DIAGNOSIS — Z951 Presence of aortocoronary bypass graft: Secondary | ICD-10-CM

## 2017-04-09 DIAGNOSIS — I2510 Atherosclerotic heart disease of native coronary artery without angina pectoris: Secondary | ICD-10-CM

## 2017-04-09 NOTE — Progress Notes (Signed)
PCP is Deland Pretty, MD Referring Provider is [name], MD  Chief Complaint  Patient presents with  . Routine Post Op    f/u from surgery with CXR s/p CABG x3, 03/07/17    HPI: Patient is a 300 pound male who returns for 1 month postop visit after CABG x3.  Before surgery he was diagnosed with DVT of his left leg and he is on Eliquis.  Patient had a prolonged recovery due to his obesity and postop A. fib but is now doing well.  He is in sinus rhythm.  He has had fluid retention treated with oral Demadex.  He has a small left pleural effusion noted on chest x-ray.  Because of orthostatic dizziness on daily Demadex he is now taking it just Mondays and Thursdays.  The patient is walking driving and ready to start outpatient cardiac rehab.  His main complaint is of constipation.  He was recommended sorbitol 70%. PA and lateral chest x-ray today shows sternal wires intact, stable cardiac silhouette, small left pleural effusion with blunting of the costophrenic angle.  Past Medical History:  Diagnosis Date  . Asthma    as a child  . Chronic kidney disease    Sees Dr [name]  . Coronary artery disease   . Diabetes mellitus without complication ([place])   . Hyperlipidemia   . Hypertension   . Myocardial infarction ([place])   . Obesity   . Pneumonia 2018  . Sleep apnea     Past Surgical History:  Procedure Laterality Date  . COLONOSCOPY    . CORONARY ARTERY BYPASS GRAFT N/A 03/07/2017   Procedure: CORONARY ARTERY BYPASS GRAFTING (CABG), X 3 , USING RIGHT INTERNAL MAMMARY ARTERY, AND RIGHT LEG GREATER SAPHENOUS VEIN HARVESTED ENDOSCOPICALLY;  Surgeon: [name], MD;  Location: [facility];  Service: Open Heart Surgery;  Laterality: N/A;  . HAND SURGERY    . RIGHT/LEFT HEART CATH AND CORONARY ANGIOGRAPHY N/A 12/20/2016   Procedure: RIGHT/LEFT HEART CATH AND CORONARY ANGIOGRAPHY;  Surgeon: [name], MD;  Location: [facility];  Service: Cardiovascular;  Laterality: N/A;   . TEE WITHOUT CARDIOVERSION N/A 03/07/2017   Procedure: TRANSESOPHAGEAL ECHOCARDIOGRAM (TEE);  Surgeon: [name], [name], MD;  Location: [facility];  Service: Open Heart Surgery;  Laterality: N/A;  . TONSILLECTOMY      Family History  Problem Relation Age of Onset  . Cancer Other   . Hypertension Other   . Hyperlipidemia Other   . Heart disease Other   . Cancer Mother   . Heart disease Mother   . Cancer Father   . Heart disease Father   . Lymphoma Sister     Social History Social History   Tobacco Use  . Smoking status: Never Smoker  . Smokeless tobacco: Never Used  Substance Use Topics  . Alcohol use: No  . Drug use: No    Current Outpatient Medications  Medication Sig Dispense Refill  . amiodarone (PACERONE) 200 MG tablet Take 1 tablet (200 mg total) by mouth daily. 90 tablet 3  . apixaban (ELIQUIS) 5 MG TABS tablet Take 1 tablet (5 mg total) by mouth 2 (two) times daily. 60 tablet 11  . aspirin EC 81 MG tablet Take 81 mg by mouth daily.    Marland Kitchen glipiZIDE (GLUCOTROL XL) 10 MG 24 hr tablet TAKE 1 TABLET BY MOUTH EVERY MORNING 90 tablet 0  . metoprolol succinate (TOPROL-XL) 25 MG 24 hr tablet Take 0.5 tablets (12.5 mg total) by  mouth every morning.    . potassium chloride (K-DUR) 10 MEQ tablet Take 1 tablet (10 mEq total) by mouth 2 (two) times a week. Take on the same day as torsemide 30 tablet 3  . torsemide (DEMADEX) 20 MG tablet Take 1 tablet (20 mg total) by mouth 2 (two) times a week. 30 tablet 3   No current facility-administered medications for this visit.     No Known Allergies  Review of Systems  Appetite and strength improving Surgical incisions clean and dry Leg swelling improved on Demadex No click or pop sensation is sternum His blood sugars have been normal on low-dose Glucotrol which he will stop.  BP 140/85   Pulse 72   Resp 20   Ht 6\' 2"  (1.88 m)   Wt 290 lb (131.5 kg)   SpO2 95%   BMI 37.23 kg/m  Physical Exam      Exam    General- alert and  comfortable    Neck- no JVD, no cervical adenopathy palpable, no carotid bruit   Lungs- clear without rales, wheezes   Cor- regular rate and rhythm, no murmur , gallop   Abdomen- soft, non-tender   Extremities - warm, non-tender, minimal edema   Neuro- oriented, appropriate, no focal weakness   Diagnostic Tests: Chest x-ray clear with blunting of left costophrenic angle  Impression: Excellent early recovery after multivessel CABG Patient can now drive and lift up to 20 pounds maximum.  He will start cardiac rehab.  He will avoid heavy lifting or strenuous exercises until 3 months after surgery he will continue his Demadex twice a week.  He will return for follow-up chest x-ray to assess the postop pleural effusion in 3 weeks.  Plan: Return with chest x-ray for review of progress in 3 weeks.   Len Childs, MD [facility] [phone]

## 2017-04-15 ENCOUNTER — Ambulatory Visit: Payer: PPO | Admitting: Endocrinology

## 2017-04-16 ENCOUNTER — Telehealth (HOSPITAL_COMMUNITY): Payer: Self-pay

## 2017-04-16 ENCOUNTER — Other Ambulatory Visit: Payer: PPO | Admitting: *Deleted

## 2017-04-16 DIAGNOSIS — I480 Paroxysmal atrial fibrillation: Secondary | ICD-10-CM

## 2017-04-16 DIAGNOSIS — I5032 Chronic diastolic (congestive) heart failure: Secondary | ICD-10-CM | POA: Diagnosis not present

## 2017-04-16 LAB — BASIC METABOLIC PANEL
BUN / CREAT RATIO: 22 (ref 10–24)
BUN: 38 mg/dL — AB (ref 8–27)
CHLORIDE: 104 mmol/L (ref 96–106)
CO2: 20 mmol/L (ref 20–29)
Calcium: 8.7 mg/dL (ref 8.6–10.2)
Creatinine, Ser: 1.69 mg/dL — ABNORMAL HIGH (ref 0.76–1.27)
GFR calc Af Amer: 48 mL/min/{1.73_m2} — ABNORMAL LOW (ref >59)
GFR calc non Af Amer: 42 mL/min/{1.73_m2} — ABNORMAL LOW (ref >59)
GLUCOSE: 143 mg/dL — AB (ref 65–99)
POTASSIUM: 4.9 mmol/L (ref 3.5–5.2)
SODIUM: 139 mmol/L (ref 134–144)

## 2017-04-16 NOTE — Telephone Encounter (Signed)
Called to speak with patient in regards to Cardiac Rehab - Scheduled orientation on 05/29/17 at 8:45am. Patient will attend the 2:45am exc class. Went over insurance with patient and patient stated he understands what he would be responsible for. Mailed packet.

## 2017-04-17 ENCOUNTER — Ambulatory Visit (INDEPENDENT_AMBULATORY_CARE_PROVIDER_SITE_OTHER): Payer: PPO | Admitting: Podiatry

## 2017-04-17 DIAGNOSIS — L84 Corns and callosities: Secondary | ICD-10-CM | POA: Diagnosis not present

## 2017-04-17 DIAGNOSIS — Q828 Other specified congenital malformations of skin: Secondary | ICD-10-CM | POA: Diagnosis not present

## 2017-04-17 DIAGNOSIS — B351 Tinea unguium: Secondary | ICD-10-CM | POA: Diagnosis not present

## 2017-04-17 DIAGNOSIS — E1151 Type 2 diabetes mellitus with diabetic peripheral angiopathy without gangrene: Secondary | ICD-10-CM | POA: Diagnosis not present

## 2017-04-17 NOTE — Progress Notes (Signed)
   Subjective:    Patient ID: Travis Blair, male    DOB: 1950-12-11, 67 y.o.   MRN: [id number]  HPI    Review of Systems  All other systems reviewed and are negative.      Objective:   Physical Exam        Assessment & Plan:

## 2017-04-18 ENCOUNTER — Ambulatory Visit
Admission: RE | Admit: 2017-04-18 | Discharge: 2017-04-18 | Disposition: A | Discharge status: Home / Self Care | Payer: PPO | Source: Ambulatory Visit | Attending: Nephrology | Admitting: Nephrology

## 2017-04-18 DIAGNOSIS — N179 Acute kidney failure, unspecified: Secondary | ICD-10-CM

## 2017-04-28 ENCOUNTER — Ambulatory Visit: Payer: PPO | Admitting: Orthotics

## 2017-04-28 DIAGNOSIS — E1169 Type 2 diabetes mellitus with other specified complication: Secondary | ICD-10-CM

## 2017-04-28 DIAGNOSIS — E1165 Type 2 diabetes mellitus with hyperglycemia: Secondary | ICD-10-CM

## 2017-04-28 DIAGNOSIS — E785 Hyperlipidemia, unspecified: Secondary | ICD-10-CM

## 2017-04-28 DIAGNOSIS — L84 Corns and callosities: Secondary | ICD-10-CM

## 2017-04-28 NOTE — Progress Notes (Signed)
Patient presents today for diabetic shoe measurement and foam casting.  Goals of diabetic shoes/inserts to offer protection from conditions secondary to DM2, offer relief from sheer forces that could lead to ulcerations, protect the foot, and offer greater stability. Patient is under supervision of DPM PRice Physician managing patients DM2: [place] Patient has following documented conditions to qualify for diabetic shoes/inserts: Dm2, PN, pre-ulcerative callus sub 5 b/l Patient measured with brannock device:13w  [id number]

## 2017-04-29 ENCOUNTER — Other Ambulatory Visit: Payer: Self-pay | Admitting: *Deleted

## 2017-04-29 DIAGNOSIS — Z951 Presence of aortocoronary bypass graft: Secondary | ICD-10-CM

## 2017-04-30 ENCOUNTER — Encounter: Payer: Self-pay | Admitting: Cardiothoracic Surgery

## 2017-04-30 ENCOUNTER — Other Ambulatory Visit: Payer: Self-pay

## 2017-04-30 ENCOUNTER — Ambulatory Visit (INDEPENDENT_AMBULATORY_CARE_PROVIDER_SITE_OTHER): Payer: Self-pay | Admitting: Cardiothoracic Surgery

## 2017-04-30 ENCOUNTER — Ambulatory Visit
Admission: RE | Admit: 2017-04-30 | Discharge: 2017-04-30 | Disposition: A | Discharge status: Home / Self Care | Payer: PPO | Source: Ambulatory Visit | Attending: Cardiothoracic Surgery | Admitting: Cardiothoracic Surgery

## 2017-04-30 ENCOUNTER — Encounter: Payer: Self-pay | Admitting: Cardiovascular Disease

## 2017-04-30 VITALS — BP 114/64 | HR 70 | Resp 18 | Ht 74.0 in | Wt 296.6 lb

## 2017-04-30 DIAGNOSIS — E1122 Type 2 diabetes mellitus with diabetic chronic kidney disease: Secondary | ICD-10-CM | POA: Diagnosis not present

## 2017-04-30 DIAGNOSIS — N179 Acute kidney failure, unspecified: Secondary | ICD-10-CM | POA: Diagnosis not present

## 2017-04-30 DIAGNOSIS — Z951 Presence of aortocoronary bypass graft: Secondary | ICD-10-CM

## 2017-04-30 DIAGNOSIS — I129 Hypertensive chronic kidney disease with stage 1 through stage 4 chronic kidney disease, or unspecified chronic kidney disease: Secondary | ICD-10-CM | POA: Diagnosis not present

## 2017-04-30 DIAGNOSIS — I25810 Atherosclerosis of coronary artery bypass graft(s) without angina pectoris: Secondary | ICD-10-CM | POA: Diagnosis not present

## 2017-04-30 NOTE — Progress Notes (Signed)
PCP is Crist Infante, MD Referring Provider is [name], MD  Chief Complaint  Patient presents with  . Coronary Artery Disease    f/u s/p CABG 03/07/2017 with chest xray    HPI: Scheduled follow-up after multivessel CABG late January On last visit patient had a small left pleural effusion-taking Demadex 20 mg twice a week Patient was seen for left ankle foot discomfort which is chronic at the ankle-foot clinic.  He is wearing support stocking. Patient has DVT in the left leg for which he takes Eliquis-6 months of therapy be completed May 1. patient is waiting to start outpatient cardiac rehab which will start later in April Patient feels much better with orthostatic dizziness after stopping the metoprolol and blood pressure has been checked and is 110/60 No recurrent symptoms of angina.  All surgical incisions healing well. He will stop the amiodarone since he has maintained sinus rhythm and he will stop the torsemide for elevated creatinine. Past Medical History:  Diagnosis Date  . Asthma    as a child  . Chronic kidney disease    Sees Dr [name]  . Coronary artery disease   . Diabetes mellitus without complication ([place])   . Hyperlipidemia   . Hypertension   . Myocardial infarction ([place])   . Obesity   . Pneumonia 2018  . Sleep apnea     Past Surgical History:  Procedure Laterality Date  . COLONOSCOPY    . CORONARY ARTERY BYPASS GRAFT N/A 03/07/2017   Procedure: CORONARY ARTERY BYPASS GRAFTING (CABG), X 3 , USING RIGHT INTERNAL MAMMARY ARTERY, AND RIGHT LEG GREATER SAPHENOUS VEIN HARVESTED ENDOSCOPICALLY;  Surgeon: [name], MD;  Location: [facility];  Service: Open Heart Surgery;  Laterality: N/A;  . HAND SURGERY    . RIGHT/LEFT HEART CATH AND CORONARY ANGIOGRAPHY N/A 12/20/2016   Procedure: RIGHT/LEFT HEART CATH AND CORONARY ANGIOGRAPHY;  Surgeon: [name], MD;  Location: [facility];  Service: Cardiovascular;  Laterality: N/A;  . TEE WITHOUT  CARDIOVERSION N/A 03/07/2017   Procedure: TRANSESOPHAGEAL ECHOCARDIOGRAM (TEE);  Surgeon: [name], [name], MD;  Location: [facility];  Service: Open Heart Surgery;  Laterality: N/A;  . TONSILLECTOMY      Family History  Problem Relation Age of Onset  . Cancer Other   . Hypertension Other   . Hyperlipidemia Other   . Heart disease Other   . Cancer Mother   . Heart disease Mother   . Cancer Father   . Heart disease Father   . Lymphoma Sister     Social History Social History   Tobacco Use  . Smoking status: Never Smoker  . Smokeless tobacco: Never Used  Substance Use Topics  . Alcohol use: No  . Drug use: No    Current Outpatient Medications  Medication Sig Dispense Refill  . amiodarone (PACERONE) 200 MG tablet Take 1 tablet (200 mg total) by mouth daily. 90 tablet 3  . apixaban (ELIQUIS) 5 MG TABS tablet Take 1 tablet (5 mg total) by mouth 2 (two) times daily. 60 tablet 11  . aspirin EC 81 MG tablet Take 81 mg by mouth daily.    Marland Kitchen glipiZIDE (GLUCOTROL XL) 10 MG 24 hr tablet TAKE 1 TABLET BY MOUTH EVERY MORNING 90 tablet 0  . metoprolol succinate (TOPROL-XL) 25 MG 24 hr tablet Take 0.5 tablets (12.5 mg total) by mouth every morning.    . potassium chloride (K-DUR) 10 MEQ tablet Take 1 tablet (10 mEq total) by mouth  2 (two) times a week. Take on the same day as torsemide 30 tablet 3  . rosuvastatin (CRESTOR) 20 MG tablet Take 20 mg by mouth daily.    Marland Kitchen torsemide (DEMADEX) 20 MG tablet Take 1 tablet (20 mg total) by mouth 2 (two) times a week. 30 tablet 3   No current facility-administered medications for this visit.     No Known Allergies  Review of Systems  BP 114/64 (BP Location: Right Arm, Patient Position: Sitting, Cuff Size: Large)   Pulse 70   Resp 18   Ht 6\' 2"  (1.88 m)   Wt 296 lb 9.6 oz (134.5 kg)   SpO2 99% Comment: RA  BMI 38.08 kg/m  Physical Exam       Exam    General- alert and comfortable    Neck- no JVD, no cervical adenopathy palpable, no carotid  bruit   Lungs- clear without rales, wheezes   Cor- regular rate and rhythm, no murmur , gallop   Abdomen- soft, non-tender   Extremities - warm, non-tender, minimal edema   Neuro- oriented, appropriate, no focal weakness  Diagnostic Tests: Chest x-ray shows improved-resolved left pleural effusion, sternal wires intact, lungs clear.  Impression: Doing well 2 months postop CABG. Patient will make above-noted changes in medications. I will see him back in mid May before his 39-month therapy for DVT in the left leg with Eliquis is completed.  Plan: Return to office mid - may for assessment of progress and discuss stopping Eliquis   Len Childs, MD [facility] [phone]

## 2017-05-03 ENCOUNTER — Other Ambulatory Visit: Payer: Self-pay | Admitting: Cardiothoracic Surgery

## 2017-05-10 ENCOUNTER — Other Ambulatory Visit: Payer: Self-pay | Admitting: Cardiothoracic Surgery

## 2017-05-11 NOTE — Progress Notes (Signed)
Subjective:  Patient ID: Travis Blair, male    DOB: 16-Jun-1950,  MRN: [id number]  Chief Complaint  Patient presents with  . Callouses    Lesion- B/L plantar forefoot below 5th toes x 1 mo; no pain Tx: none  . debride    B/L debride -Diabetes type 2 sugar; 106 A1C: 6.6    67 y.o. male presents with the above complaint.  Reports callused areas in the fifth toes of both feet.  Type II diabetic with last blood sugar 106.  Last A1c 6.6 down from 11.2.  AM blood sugar 106.  Reports numbness and tingling in his feet denies cramping Past Medical History:  Diagnosis Date  . Asthma    as a child  . Chronic kidney disease    Sees Dr [name]  . Coronary artery disease   . Diabetes mellitus without complication ([place])   . Hyperlipidemia   . Hypertension   . Myocardial infarction ([place])   . Obesity   . Pneumonia 2018  . Sleep apnea    Past Surgical History:  Procedure Laterality Date  . COLONOSCOPY    . CORONARY ARTERY BYPASS GRAFT N/A 03/07/2017   Procedure: CORONARY ARTERY BYPASS GRAFTING (CABG), X 3 , USING RIGHT INTERNAL MAMMARY ARTERY, AND RIGHT LEG GREATER SAPHENOUS VEIN HARVESTED ENDOSCOPICALLY;  Surgeon: [name], MD;  Location: [facility];  Service: Open Heart Surgery;  Laterality: N/A;  . HAND SURGERY    . RIGHT/LEFT HEART CATH AND CORONARY ANGIOGRAPHY N/A 12/20/2016   Procedure: RIGHT/LEFT HEART CATH AND CORONARY ANGIOGRAPHY;  Surgeon: [name], MD;  Location: [facility];  Service: Cardiovascular;  Laterality: N/A;  . TEE WITHOUT CARDIOVERSION N/A 03/07/2017   Procedure: TRANSESOPHAGEAL ECHOCARDIOGRAM (TEE);  Surgeon: [name], [name], MD;  Location: [facility];  Service: Open Heart Surgery;  Laterality: N/A;  . TONSILLECTOMY      Current Outpatient Medications:  .  amiodarone (PACERONE) 200 MG tablet, Take 1 tablet (200 mg total) by mouth daily., Disp: 90 tablet, Rfl: 3 .  apixaban (ELIQUIS) 5 MG TABS tablet, Take 1 tablet (5 mg total) by mouth 2 (two) times  daily., Disp: 60 tablet, Rfl: 11 .  aspirin EC 81 MG tablet, Take 81 mg by mouth daily., Disp: , Rfl:  .  metoprolol succinate (TOPROL-XL) 25 MG 24 hr tablet, Take 0.5 tablets (12.5 mg total) by mouth every morning., Disp: , Rfl:  .  potassium chloride (K-DUR) 10 MEQ tablet, Take 1 tablet (10 mEq total) by mouth 2 (two) times a week. Take on the same day as torsemide, Disp: 30 tablet, Rfl: 3 .  torsemide (DEMADEX) 20 MG tablet, Take 1 tablet (20 mg total) by mouth 2 (two) times a week., Disp: 30 tablet, Rfl: 3 .  glipiZIDE (GLUCOTROL XL) 10 MG 24 hr tablet, TAKE 1 TABLET BY MOUTH EVERY DAY IN THE MORNING, Disp: 90 tablet, Rfl: 0 .  rosuvastatin (CRESTOR) 20 MG tablet, Take 20 mg by mouth daily., Disp: , Rfl:   No Known Allergies Review of Systems Objective:   General AA&O x3. Normal mood and affect.  Vascular Dorsalis pedis pulses present 1+ bilaterally  Posterior tibial pulses absent bilaterally  Capillary refill normal to all digits. Pedal hair growth normal.  Neurologic Epicritic sensation present bilaterally. Protective sensation with 5.07 monofilament  present bilaterally. Vibratory sensation present bilaterally.  Dermatologic No open lesions. Interspaces clear of maceration.  Normal skin temperature and turgor. Hyperkeratotic lesions: Fifth MPJ bilateral nails: brittle, onychomycosis, thickening,  elongation  Orthopedic: No history of amputation. MMT 5/5 in dorsiflexion, plantarflexion, inversion, and eversion. Normal lower extremity joint ROM without pain or crepitus.     Assessment & Plan:  Patient was evaluated and treated and all questions answered.  Diabetes with PAD, Onychomycosis -Educated on diabetic footcare. Diabetic risk level 1 -Nails x10 debrided sharply and manually with large nail nipper and rotary burr.  Procedure: Nail Debridement Rationale: Patient meets criteria for routine foot care due to Class B findings. Type of Debridement: manual, sharp  debridement. Instrumentation: Nail nipper, rotary burr. Number of Nails: 10  Procedure: Paring of Lesion Rationale: painful hyperkeratotic lesion Type of Debridement: manual, sharp debridement. Instrumentation: 312 blade Number of Lesions: 2   Return in about 3 months (around 07/18/2017) for Diabetic Foot Care.

## 2017-05-20 ENCOUNTER — Telehealth (HOSPITAL_COMMUNITY): Payer: Self-pay | Admitting: Pharmacist

## 2017-05-20 NOTE — Telephone Encounter (Signed)
Cardiac Rehab Medication Review by a Pharmacist  Does the patient  feel that his/her medications are working for him/her?  yes  Has the patient been experiencing any side effects to the medications prescribed?  Yes, when patient was on metoprolol he was having dizziness, but BP is normal now, so the Dr. Cristi Loron blood pressure medications - metoprolol, torsemide. Patient states that he has severe muscle weakness, and legs give out in the afternoon after walking around. Started about a month ago, when patient started Crestor.   Does the patient measure his/her own blood pressure or blood glucose at home?  Yes, normal BP 110-130/60-70, BG is around 100-120s.  Does the patient have any problems obtaining medications due to transportation or finances?   no  Understanding of regimen: excellent Understanding of indications: excellent Potential of compliance: excellent  Pharmacist comments: Patient is seeing Dr. Acie Fredrickson next week and will bring up the muscle weakness and the side effects of the Crestor. Potential option would be to switch to another statin or decrease to 10 mg. No other questions or concerns noted.  Nida Boatman, PharmD PGY1 Acute Care Pharmacy Resident Pager: [phone] 05/20/2017 1:12 PM

## 2017-05-22 ENCOUNTER — Ambulatory Visit (INDEPENDENT_AMBULATORY_CARE_PROVIDER_SITE_OTHER): Payer: PPO | Admitting: Orthotics

## 2017-05-22 DIAGNOSIS — E1151 Type 2 diabetes mellitus with diabetic peripheral angiopathy without gangrene: Secondary | ICD-10-CM

## 2017-05-22 DIAGNOSIS — E1165 Type 2 diabetes mellitus with hyperglycemia: Secondary | ICD-10-CM

## 2017-05-22 DIAGNOSIS — L84 Corns and callosities: Secondary | ICD-10-CM

## 2017-05-22 NOTE — Progress Notes (Signed)

## 2017-05-27 ENCOUNTER — Ambulatory Visit (INDEPENDENT_AMBULATORY_CARE_PROVIDER_SITE_OTHER): Payer: PPO | Admitting: Cardiovascular Disease

## 2017-05-27 ENCOUNTER — Encounter: Payer: Self-pay | Admitting: Cardiovascular Disease

## 2017-05-27 VITALS — BP 152/70 | HR 69 | Ht 74.0 in | Wt 309.8 lb

## 2017-05-27 DIAGNOSIS — Z951 Presence of aortocoronary bypass graft: Secondary | ICD-10-CM

## 2017-05-27 DIAGNOSIS — I2510 Atherosclerotic heart disease of native coronary artery without angina pectoris: Secondary | ICD-10-CM | POA: Diagnosis not present

## 2017-05-27 NOTE — Progress Notes (Signed)
Cardiology Office Note:    Date:  05/27/2017   ID:  Travis Blair, DOB [birth year], MRN [id number]  PCP:  Crist Infante, MD  Cardiologist:  [name], MD    Referring MD: Deland Pretty, MD   Problem list 1.  Coronary artery disease-status post coronary artery bypass grafting 2.  Chronic diastolic congestive heart failure 3.  Postoperative atrial fibrillation 4.  Deep vein thrombosis 5.  Chronic kidney disease 6.  Hypertension 7.  Hyperlipidemia 8.  Diabetes mellitus 9.  Obstructive sleep apnea  Chief Complaint  Patient presents with  . Coronary Artery Disease  . Congestive Heart Failure        Travis Range Osten [name]) is a 67 y.o. male with a hx of CAD, DM Seen with wife [name]   Has had DOE with walking  -  Was placed on Imdur, Metoprolol  , passed out , Has CKD - Lisinopril was stopped.  Worse for the past month. Has a + stress test,   Cath shows severe CAD.   Has seen Dr. [name]  - recommnended nephrology consult.  Has not heard back from nephrologist   Has had swelling of left leg for 8 months .  No angina ( is diabetic)   February 26, 2017: Seen back today for follow-up of his coronary artery disease, diabetes mellitus, hypertension, and chronic renal insufficiency. He was found to have a DVT several months ago.  He was started on Eliquis. Has seen Dr. Ron Agee and is scheduled for CABG next Friday  BP is a bit high initially but was normal by the end of the visit   March 26, 2017:  [name] is seen back today for follow-up of his coronary artery disease.  He had coronary artery bypass grafting in January, 2018.  Coronary artery bypass grafting x3 (left internal mammary artery to left anterior descending, saphenous vein graft to posterior descending, saphenous vein graft to circumflex marginal).  He had postoperative atrial fibrillation and was treated with amiodarone.  He was started back  on Eliquis.  Was on Eliquis for a DVT starting    Has had bilateral leg edema   May 27, 2017: [name] is seen back today for follow-up visit.  He has a history of coronary artery bypass grafting approximately 3 months ago.  He had postoperative atrial fibrillation and has been on amiodarone.  He is been on Eliquis for his atrial fibrillation and also for his DVT. Has stopped his rosuvastatin due to leg cramps aching   Past Medical History:  Diagnosis Date  . Asthma    as a child  . Chronic kidney disease    Sees Dr [name]  . Coronary artery disease   . Diabetes mellitus without complication ([place])   . Hyperlipidemia   . Hypertension   . Myocardial infarction ([place])   . Obesity   . Pneumonia 2018  . Sleep apnea     Past Surgical History:  Procedure Laterality Date  . COLONOSCOPY    . CORONARY ARTERY BYPASS GRAFT N/A 03/07/2017   Procedure: CORONARY ARTERY BYPASS GRAFTING (CABG), X 3 , USING RIGHT INTERNAL MAMMARY ARTERY, AND RIGHT LEG GREATER SAPHENOUS VEIN HARVESTED ENDOSCOPICALLY;  Surgeon: [name], MD;  Location: [facility];  Service: Open Heart Surgery;  Laterality: N/A;  . HAND SURGERY    . RIGHT/LEFT HEART CATH AND CORONARY ANGIOGRAPHY N/A 12/20/2016   Procedure: RIGHT/LEFT HEART CATH AND CORONARY ANGIOGRAPHY;  Surgeon: [name], MD;  Location: [facility];  Service: Cardiovascular;  Laterality: N/A;  . TEE WITHOUT CARDIOVERSION N/A 03/07/2017   Procedure: TRANSESOPHAGEAL ECHOCARDIOGRAM (TEE);  Surgeon: [name], [name], MD;  Location: [facility];  Service: Open Heart Surgery;  Laterality: N/A;  . TONSILLECTOMY      Current Medications: Current Meds  Medication Sig  . apixaban (ELIQUIS) 5 MG TABS tablet Take 1 tablet (5 mg total) by mouth 2 (two) times daily.  Marland Kitchen aspirin EC 81 MG tablet Take 81 mg by mouth daily.  Marland Kitchen glipiZIDE (GLUCOTROL XL) 10 MG 24 hr tablet TAKE 1 TABLET BY MOUTH EVERY DAY IN THE MORNING     Allergies:   Patient has no known allergies.   Social History   Socioeconomic History  .  Marital status: Married    Spouse name: Not on file  . Number of children: Not on file  . Years of education: Not on file  . Highest education level: Not on file  Occupational History  . Not on file  Social Needs  . Financial resource strain: Not on file  . Food insecurity:    Worry: Not on file    Inability: Not on file  . Transportation needs:    Medical: Not on file    Non-medical: Not on file  Tobacco Use  . Smoking status: Never Smoker  . Smokeless tobacco: Never Used  Substance and Sexual Activity  . Alcohol use: No  . Drug use: No  . Sexual activity: Never  Lifestyle  . Physical activity:    Days per week: Not on file    Minutes per session: Not on file  . Stress: Not on file  Relationships  . Social connections:    Talks on phone: Not on file    Gets together: Not on file    Attends religious service: Not on file    Active member of club or organization: Not on file    Attends meetings of clubs or organizations: Not on file    Relationship status: Not on file  Other Topics Concern  . Not on file  Social History Narrative  . Not on file     Family History: The patient's family history includes Cancer in his father, mother, and other; Heart disease in his father, mother, and other; Hyperlipidemia in his other; Hypertension in his other; Lymphoma in his sister. ROS:   Please see the history of present illness.     All other systems reviewed and are negative.  EKGs/Labs/Other Studies Reviewed:    The following studies were reviewed today: Cath films, previous records.     Recent Labs: 03/04/2017: ALT 17 03/08/2017: Magnesium 2.6 03/11/2017: TSH 4.235 03/13/2017: Hemoglobin 10.6; Platelets 210 04/16/2017: BUN 38; Creatinine, Ser 1.69; Potassium 4.9; Sodium 139  Recent Lipid Panel No results found for: CHOL, TRIG, HDL, CHOLHDL, VLDL, LDLCALC, LDLDIRECT Physical Exam: Blood pressure (!) 152/70, pulse 69, height 6\' 2"  (1.88 m), weight (!) 309 lb 12.8 oz (140.5  kg), SpO2 99 %.  GEN:   Obese, middle age man,  NAD  HEENT: Normal NECK: No JVD; No carotid bruits LYMPHATICS: No lymphadenopathy CARDIAC: RRR , no murmurs, rubs, gallops RESPIRATORY:  Clear to auscultation without rales, wheezing or rhonchi  ABDOMEN: Soft, non-tender, non-distended MUSCULOSKELETAL:  Trace leg  edema; No deformity  SKIN: Warm and dry NEUROLOGIC:  Alert and oriented x 3  EKG:   May 27, 2017: Normal sinus rhythm at 69.  His P waves are very small.  Occasional premature ventricular  contractions versus aberrantly conducted beats. Inf. ST changes are slight more pronounced than previous  ASSESSMENT:    1. Coronary artery disease involving native coronary artery of native heart without angina pectoris   2. S/P CABG (coronary artery bypass graft)    PLAN:     1.  Coronary artery disease: Patient had some volume overload.  We treated him with  Torsemide  and potassium for several weeks..  2.   CKD -managed by Dr. [name].  3.  Paroxysmal atrial fibrillation:     the patient has not had any recurrent episodes of atrial fibrillation.  We will discontinue the amiodarone.  3.  Hyperlipidemia:  Has not tolerated rosuvastatin.  We will refer him to lipid clinic for consideration of PC SK 9 inhibitor.  4.  DVT:    He has a history of paroxysmal atrial fibrillation following surgery.  Patient has been on Eliquis. He is also had a deep vein thrombosis.  He may  need Eliquis lifelong but I will defer to his primary medical doctor.  5.   DM -this is been managed by his primary medical doctor  6. HTN   :  BP is well controlled at home   Medication Adjustments/Labs and Tests Ordered: Current medicines are reviewed at length with the patient today.  Concerns regarding medicines are outlined above.  Orders Placed This Encounter  Procedures  . EKG 12-Lead   No orders of the defined types were placed in this encounter.   Signed, [name], MD  05/27/2017 6:01 PM    [facility]

## 2017-05-27 NOTE — Patient Instructions (Signed)
Medication Instructions:  Your physician recommends that you continue on your current medications as directed. Please refer to the Current Medication list given to you today.   Labwork: None Ordered   Testing/Procedures: None Ordered   Follow-Up: You have been referred to [facility] for management of high cholesterol  Your physician wants you to follow-up in: 6 months with Dr. Acie Fredrickson. You will receive a reminder letter in the mail two months in advance. If you don't receive a letter, please call our office to schedule the follow-up appointment.   If you need a refill on your cardiac medications before your next appointment, please call your pharmacy.   Thank you for choosing [facility]! Christen Bame, RN [phone]

## 2017-05-29 ENCOUNTER — Other Ambulatory Visit: Payer: PPO | Admitting: Orthotics

## 2017-05-29 ENCOUNTER — Ambulatory Visit: Payer: PPO | Admitting: Podiatry

## 2017-05-29 ENCOUNTER — Encounter (HOSPITAL_COMMUNITY)
Admission: RE | Admit: 2017-05-29 | Discharge: 2017-05-29 | Disposition: A | Discharge status: Home / Self Care | Payer: PPO | Source: Ambulatory Visit | Attending: Cardiovascular Disease | Admitting: Cardiovascular Disease

## 2017-05-29 ENCOUNTER — Encounter (HOSPITAL_COMMUNITY): Payer: Self-pay

## 2017-05-29 VITALS — Ht 74.0 in | Wt 308.9 lb

## 2017-05-29 DIAGNOSIS — Q828 Other specified congenital malformations of skin: Secondary | ICD-10-CM | POA: Diagnosis not present

## 2017-05-29 DIAGNOSIS — Z79899 Other long term (current) drug therapy: Secondary | ICD-10-CM | POA: Diagnosis not present

## 2017-05-29 DIAGNOSIS — Z951 Presence of aortocoronary bypass graft: Secondary | ICD-10-CM | POA: Diagnosis not present

## 2017-05-29 DIAGNOSIS — E1122 Type 2 diabetes mellitus with diabetic chronic kidney disease: Secondary | ICD-10-CM | POA: Insufficient documentation

## 2017-05-29 DIAGNOSIS — I129 Hypertensive chronic kidney disease with stage 1 through stage 4 chronic kidney disease, or unspecified chronic kidney disease: Secondary | ICD-10-CM | POA: Insufficient documentation

## 2017-05-29 DIAGNOSIS — N189 Chronic kidney disease, unspecified: Secondary | ICD-10-CM | POA: Insufficient documentation

## 2017-05-29 DIAGNOSIS — E1165 Type 2 diabetes mellitus with hyperglycemia: Secondary | ICD-10-CM | POA: Diagnosis not present

## 2017-05-29 DIAGNOSIS — Z7982 Long term (current) use of aspirin: Secondary | ICD-10-CM | POA: Diagnosis not present

## 2017-05-29 HISTORY — DX: Acute embolism and thrombosis of unspecified deep veins of unspecified lower extremity: I82.409

## 2017-05-29 NOTE — Progress Notes (Signed)
Pt here for orientation and walk test. Patient noted to have intermittent PVC's  And couplets today. Patient asymptomatic. Entry blood pressure 128/60. Heart rate 68. [facility] paged and notified. No new orders received.Will continue to monitor the patient throughout  the program.Deontaye Civello Venetia Maxon, RN,BSN 05/29/2017 10:01 AM

## 2017-05-29 NOTE — Progress Notes (Signed)
Cardiac Individual Treatment Plan  Patient Details  Name: Travis Blair MRN: [id number] Date of Birth: [birth year] Referring Provider:     CARDIAC REHAB PHASE II ORIENTATION from 05/29/2017 in [place]  Referring Provider  Nahser, Wonda Cheng MD      Initial Encounter Date:    CARDIAC REHAB PHASE II ORIENTATION from 05/29/2017 in [place]  Date  05/29/17  Referring Provider  Nahser, Wonda Cheng MD      Visit Diagnosis: S/P CABG x 3  Patient's Home Medications on Admission:  Current Outpatient Medications:  .  apixaban (ELIQUIS) 5 MG TABS tablet, Take 1 tablet (5 mg total) by mouth 2 (two) times daily., Disp: 60 tablet, Rfl: 11 .  aspirin EC 81 MG tablet, Take 81 mg by mouth daily., Disp: , Rfl:  .  glipiZIDE (GLUCOTROL XL) 10 MG 24 hr tablet, TAKE 1 TABLET BY MOUTH EVERY DAY IN THE MORNING, Disp: 90 tablet, Rfl: 0  Past Medical History: Past Medical History:  Diagnosis Date  . Asthma    as a child  . Chronic kidney disease    Sees Dr [name]  . Coronary artery disease   . Diabetes mellitus without complication ([place])   . DVT (deep venous thrombosis) ([place])   . Hyperlipidemia   . Hypertension   . Myocardial infarction ([place])   . Obesity   . Pneumonia 2018  . Sleep apnea     Tobacco Use: Social History   Tobacco Use  Smoking Status Never Smoker  Smokeless Tobacco Never Used    Labs: Recent Review Flowsheet Data    Labs for ITP Cardiac and Pulmonary Rehab Latest Ref Rng & Units 03/07/2017 03/07/2017 03/07/2017 03/07/2017 03/08/2017   Hemoglobin A1c 4.8 - 5.6 % - - - - -   PHART 7.350 - 7.450 7.318(L) 7.365 7.311(L) - -   PCO2ART 32.0 - 48.0 mmHg 45.4 40.8 42.6 - -   HCO3 20.0 - 28.0 mmol/L 23.5 23.4 21.6 - -   TCO2 22 - 32 mmol/L 25 25 23 25 22    ACIDBASEDEF 0.0 - 2.0 mmol/L 3.0(H) 2.0 5.0(H) - -   O2SAT % 99.0 100.0 98.0 - -      Capillary Blood Glucose: Lab Results  Component Value Date   GLUCAP  192 (H) 03/13/2017   GLUCAP 141 (H) 03/13/2017   GLUCAP 200 (H) 03/12/2017   GLUCAP 266 (H) 03/12/2017   GLUCAP 254 (H) 03/12/2017     Exercise Target Goals: Date: 05/29/17  Exercise Program Goal: Individual exercise prescription set using results from initial 6 min walk test and THRR while considering  patient's activity barriers and safety.   Exercise Prescription Goal: Initial exercise prescription builds to 30-45 minutes a day of aerobic activity, 2-3 days per week.  Home exercise guidelines will be given to patient during program as part of exercise prescription that the participant will acknowledge.  Activity Barriers & Risk Stratification: Activity Barriers & Cardiac Risk Stratification - 05/29/17 0932      Activity Barriers & Cardiac Risk Stratification   Activity Barriers  Back Problems    Cardiac Risk Stratification  High       6 Minute Walk: 6 Minute Walk    Row Name 05/29/17 1111 05/29/17 1130       6 Minute Walk   Phase  Initial  -    Distance  1279 feet  -    Walk Time  6 minutes  -    #  of Rest Breaks  0  -    MPH  2.4  -    METS  2.5  -    RPE  12  -    Perceived Dyspnea   0  -    Symptoms  No  -    Resting HR  69 bpm  -    Resting BP  128/60  -    Resting Oxygen Saturation   99 %  -    Exercise Oxygen Saturation  during 6 min walk  100 %  -    Max Ex. HR  96 bpm  -    Max Ex. BP  164/80  -    2 Minute Post BP  154/72  118/84       Oxygen Initial Assessment:   Oxygen Re-Evaluation:   Oxygen Discharge (Final Oxygen Re-Evaluation):   Initial Exercise Prescription: Initial Exercise Prescription - 05/29/17 1100      Date of Initial Exercise RX and Referring Provider   Date  05/29/17    Referring Provider  Nahser, Wonda Cheng MD      Bike   Level  0.7    Minutes  10    METs  2.26      NuStep   Level  3    SPM  75    Minutes  10    METs  2.2      Track   Laps  9    Minutes  10    METs  2.53      Prescription Details    Frequency (times per week)  3x    Duration  Progress to 30 minutes of continuous aerobic without signs/symptoms of physical distress      Intensity   THRR 40-80% of Max Heartrate  62-125    Ratings of Perceived Exertion  11-13    Perceived Dyspnea  0-4      Progression   Progression  Continue progressive overload as per policy without signs/symptoms or physical distress.      Resistance Training   Training Prescription  Yes    Weight  4lbs    Reps  10-15       Perform Capillary Blood Glucose checks as needed.  Exercise Prescription Changes:   Exercise Comments:   Exercise Goals and Review: Exercise Goals    Row Name 05/29/17 1116             Exercise Goals   Increase Physical Activity  Yes       Intervention  Provide advice, education, support and counseling about physical activity/exercise needs.;Develop an individualized exercise prescription for aerobic and resistive training based on initial evaluation findings, risk stratification, comorbidities and participant's personal goals.       Expected Outcomes  Short Term: Attend rehab on a regular basis to increase amount of physical activity.;Long Term: Add in home exercise to make exercise part of routine and to increase amount of physical activity.;Long Term: Exercising regularly at least 3-5 days a week.       Increase Strength and Stamina  Yes       Intervention  Provide advice, education, support and counseling about physical activity/exercise needs.;Develop an individualized exercise prescription for aerobic and resistive training based on initial evaluation findings, risk stratification, comorbidities and participant's personal goals.       Expected Outcomes  Short Term: Increase workloads from initial exercise prescription for resistance, speed, and METs.;Short Term: Perform resistance training exercises routinely during rehab and  add in resistance training at home;Long Term: Improve cardiorespiratory fitness, muscular  endurance and strength as measured by increased METs and functional capacity (6MWT)       Able to understand and use rate of perceived exertion (RPE) scale  Yes       Intervention  Provide education and explanation on how to use RPE scale       Expected Outcomes  Long Term:  Able to use RPE to guide intensity level when exercising independently;Short Term: Able to use RPE daily in rehab to express subjective intensity level       Knowledge and understanding of Target Heart Rate Range (THRR)  Yes       Intervention  Provide education and explanation of THRR including how the numbers were predicted and where they are located for reference       Expected Outcomes  Short Term: Able to state/look up THRR;Short Term: Able to use daily as guideline for intensity in rehab;Long Term: Able to use THRR to govern intensity when exercising independently       Able to check pulse independently  Yes       Intervention  Provide education and demonstration on how to check pulse in carotid and radial arteries.;Review the importance of being able to check your own pulse for safety during independent exercise       Expected Outcomes  Short Term: Able to explain why pulse checking is important during independent exercise;Long Term: Able to check pulse independently and accurately       Understanding of Exercise Prescription  Yes       Intervention  Provide education, explanation, and written materials on patient's individual exercise prescription       Expected Outcomes  Short Term: Able to explain program exercise prescription;Long Term: Able to explain home exercise prescription to exercise independently          Exercise Goals Re-Evaluation :    Discharge Exercise Prescription (Final Exercise Prescription Changes):   Nutrition:  Target Goals: Understanding of nutrition guidelines, daily intake of sodium 1500mg , cholesterol 200mg , calories 30% from fat and 7% or less from saturated fats, daily to have 5 or  more servings of fruits and vegetables.  Biometrics: Pre Biometrics - 05/29/17 1117      Pre Biometrics   Height  6\' 2"  (1.88 m)    Weight  308 lb 13.8 oz (140.1 kg)  (Abnormal)     Waist Circumference  54 inches    Hip Circumference  49.5 inches    Waist to Hip Ratio  1.09 %    BMI (Calculated)  39.64    Triceps Skinfold  20 mm    % Body Fat  38.9 %    Grip Strength  14 kg    Flexibility  14 in    Single Leg Stand  7 seconds        Nutrition Therapy Plan and Nutrition Goals:   Nutrition Assessments:   Nutrition Goals Re-Evaluation:   Nutrition Goals Re-Evaluation:   Nutrition Goals Discharge (Final Nutrition Goals Re-Evaluation):   Psychosocial: Target Goals: Acknowledge presence or absence of significant depression and/or stress, maximize coping skills, provide positive support system. Participant is able to verbalize types and ability to use techniques and skills needed for reducing stress and depression.  Initial Review & Psychosocial Screening: Initial Psych Review & Screening - 05/29/17 1146      Initial Review   Current issues with  None Identified  Family Dynamics   Good Support System?  Yes Pt reports that his wife is a part of his good support system.     Comments  No psychosocial needs identified. No interventions necessary.       Barriers   Psychosocial barriers to participate in program  There are no identifiable barriers or psychosocial needs.      Screening Interventions   Interventions  Encouraged to exercise    Expected Outcomes  Short Term goal: Identification and review with participant of any Quality of Life or Depression concerns found by scoring the questionnaire.;Long Term goal: The participant improves quality of Life and PHQ9 Scores as seen by post scores and/or verbalization of changes       Quality of Life Scores: Quality of Life - 05/29/17 1118      Quality of Life Scores   Health/Function Pre  21.36 %    Socioeconomic Pre   25.07 %    Psych/Spiritual Pre  24.93 %    Family Pre  26.3 %    GLOBAL Pre  23.65 %      Scores of 19 and below usually indicate a poorer quality of life in these areas.  A difference of  2-3 points is a clinically meaningful difference.  A difference of 2-3 points in the total score of the Quality of Life Index has been associated with significant improvement in overall quality of life, self-image, physical symptoms, and general health in studies assessing change in quality of life.  PHQ-9: Recent Review Flowsheet Data    Depression screen [facility] 2/9 10/14/2013   Decreased Interest 0   Down, Depressed, Hopeless 0   PHQ - 2 Score 0     Interpretation of Total Score  Total Score Depression Severity:  1-4 = Minimal depression, 5-9 = Mild depression, 10-14 = Moderate depression, 15-19 = Moderately severe depression, 20-27 = Severe depression   Psychosocial Evaluation and Intervention:   Psychosocial Re-Evaluation:   Psychosocial Discharge (Final Psychosocial Re-Evaluation):   Vocational Rehabilitation: Provide vocational rehab assistance to qualifying candidates.   Vocational Rehab Evaluation & Intervention: Vocational Rehab - 05/29/17 1145      Initial Vocational Rehab Evaluation & Intervention   Assessment shows need for Vocational Rehabilitation  No       Education: Education Goals: Education classes will be provided on a weekly basis, covering required topics. Participant will state understanding/return demonstration of topics presented.  Learning Barriers/Preferences: Learning Barriers/Preferences - 05/29/17 0931      Learning Barriers/Preferences   Learning Barriers  Sight    Learning Preferences  Pictoral;Skilled Demonstration;Video       Education Topics: Count Your Pulse:  -Group instruction provided by verbal instruction, demonstration, patient participation and written materials to support subject.  Instructors address importance of being able to find your  pulse and how to count your pulse when at home without a heart monitor.  Patients get hands on experience counting their pulse with staff help and individually.   Heart Attack, Angina, and Risk Factor Modification:  -Group instruction provided by verbal instruction, video, and written materials to support subject.  Instructors address signs and symptoms of angina and heart attacks.    Also discuss risk factors for heart disease and how to make changes to improve heart health risk factors.   Functional Fitness:  -Group instruction provided by verbal instruction, demonstration, patient participation, and written materials to support subject.  Instructors address safety measures for doing things around the house.  Discuss how to  get up and down off the floor, how to pick things up properly, how to safely get out of a chair without assistance, and balance training.   Meditation and Mindfulness:  -Group instruction provided by verbal instruction, patient participation, and written materials to support subject.  Instructor addresses importance of mindfulness and meditation practice to help reduce stress and improve awareness.  Instructor also leads participants through a meditation exercise.    Stretching for Flexibility and Mobility:  -Group instruction provided by verbal instruction, patient participation, and written materials to support subject.  Instructors lead participants through series of stretches that are designed to increase flexibility thus improving mobility.  These stretches are additional exercise for major muscle groups that are typically performed during regular warm up and cool down.   Hands Only CPR:  -Group verbal, video, and participation provides a basic overview of AHA guidelines for community CPR. Role-play of emergencies allow participants the opportunity to practice calling for help and chest compression technique with discussion of AED use.   Hypertension: -Group verbal  and written instruction that provides a basic overview of hypertension including the most recent diagnostic guidelines, risk factor reduction with self-care instructions and medication management.    Nutrition I class: Heart Healthy Eating:  -Group instruction provided by PowerPoint slides, verbal discussion, and written materials to support subject matter. The instructor gives an explanation and review of the Therapeutic Lifestyle Changes diet recommendations, which includes a discussion on lipid goals, dietary fat, sodium, fiber, plant stanol/sterol esters, sugar, and the components of a well-balanced, healthy diet.   Nutrition II class: Lifestyle Skills:  -Group instruction provided by PowerPoint slides, verbal discussion, and written materials to support subject matter. The instructor gives an explanation and review of label reading, grocery shopping for heart health, heart healthy recipe modifications, and ways to make healthier choices when eating out.   Diabetes Question & Answer:  -Group instruction provided by PowerPoint slides, verbal discussion, and written materials to support subject matter. The instructor gives an explanation and review of diabetes co-morbidities, pre- and post-prandial blood glucose goals, pre-exercise blood glucose goals, signs, symptoms, and treatment of hypoglycemia and hyperglycemia, and foot care basics.   Diabetes Blitz:  -Group instruction provided by PowerPoint slides, verbal discussion, and written materials to support subject matter. The instructor gives an explanation and review of the physiology behind type 1 and type 2 diabetes, diabetes medications and rational behind using different medications, pre- and post-prandial blood glucose recommendations and Hemoglobin A1c goals, diabetes diet, and exercise including blood glucose guidelines for exercising safely.    Portion Distortion:  -Group instruction provided by PowerPoint slides, verbal discussion,  written materials, and food models to support subject matter. The instructor gives an explanation of serving size versus portion size, changes in portions sizes over the last 20 years, and what consists of a serving from each food group.   Stress Management:  -Group instruction provided by verbal instruction, video, and written materials to support subject matter.  Instructors review role of stress in heart disease and how to cope with stress positively.     Exercising on Your Own:  -Group instruction provided by verbal instruction, power point, and written materials to support subject.  Instructors discuss benefits of exercise, components of exercise, frequency and intensity of exercise, and end points for exercise.  Also discuss use of nitroglycerin and activating EMS.  Review options of places to exercise outside of rehab.  Review guidelines for sex with heart disease.   Cardiac Drugs  I:  -Group instruction provided by verbal instruction and written materials to support subject.  Instructor reviews cardiac drug classes: antiplatelets, anticoagulants, beta blockers, and statins.  Instructor discusses reasons, side effects, and lifestyle considerations for each drug class.   Cardiac Drugs II:  -Group instruction provided by verbal instruction and written materials to support subject.  Instructor reviews cardiac drug classes: angiotensin converting enzyme inhibitors (ACE-I), angiotensin II receptor blockers (ARBs), nitrates, and calcium channel blockers.  Instructor discusses reasons, side effects, and lifestyle considerations for each drug class.   Anatomy and Physiology of the Circulatory System:  Group verbal and written instruction and models provide basic cardiac anatomy and physiology, with the coronary electrical and arterial systems. Review of: AMI, Angina, Valve disease, Heart Failure, Peripheral Artery Disease, Cardiac Arrhythmia, Pacemakers, and the ICD.   Other Education:  -Group  or individual verbal, written, or video instructions that support the educational goals of the cardiac rehab program.   Holiday Eating Survival Tips:  -Group instruction provided by PowerPoint slides, verbal discussion, and written materials to support subject matter. The instructor gives patients tips, tricks, and techniques to help them not only survive but enjoy the holidays despite the onslaught of food that accompanies the holidays.   Knowledge Questionnaire Score: Knowledge Questionnaire Score - 05/29/17 1107      Knowledge Questionnaire Score   Pre Score  22/24       Core Components/Risk Factors/Patient Goals at Admission: Personal Goals and Risk Factors at Admission - 05/29/17 1109      Core Components/Risk Factors/Patient Goals on Admission    Weight Management  Yes;Weight Loss;Obesity    Intervention  Weight Management: Develop a combined nutrition and exercise program designed to reach desired caloric intake, while maintaining appropriate intake of nutrient and fiber, sodium and fats, and appropriate energy expenditure required for the weight goal.;Weight Management/Obesity: Establish reasonable short term and long term weight goals.;Weight Management: Provide education and appropriate resources to help participant work on and attain dietary goals.;Obesity: Provide education and appropriate resources to help participant work on and attain dietary goals.    Admit Weight  308 lb 13.8 oz (140.1 kg)    Goal Weight: Short Term  300 lb (136.1 kg)    Goal Weight: Long Term  290 lb (131.5 kg)    Expected Outcomes  Short Term: Continue to assess and modify interventions until short term weight is achieved;Long Term: Adherence to nutrition and physical activity/exercise program aimed toward attainment of established weight goal;Weight Loss: Understanding of general recommendations for a balanced deficit meal plan, which promotes 1-2 lb weight loss per week and includes a negative energy  balance of [phone] kcal/d;Understanding recommendations for meals to include 15-35% energy as protein, 25-35% energy from fat, 35-60% energy from carbohydrates, less than 200mg  of dietary cholesterol, 20-35 gm of total fiber daily;Understanding of distribution of calorie intake throughout the day with the consumption of 4-5 meals/snacks    Diabetes  Yes    Intervention  Provide education about signs/symptoms and action to take for hypo/hyperglycemia.;Provide education about proper nutrition, including hydration, and aerobic/resistive exercise prescription along with prescribed medications to achieve blood glucose in normal ranges: Fasting glucose 65-99 mg/dL    Expected Outcomes  Short Term: Participant verbalizes understanding of the signs/symptoms and immediate care of hyper/hypoglycemia, proper foot care and importance of medication, aerobic/resistive exercise and nutrition plan for blood glucose control.;Long Term: Attainment of HbA1C < 7%.    Hypertension  Yes    Intervention  Provide education on  lifestyle modifcations including regular physical activity/exercise, weight management, moderate sodium restriction and increased consumption of fresh fruit, vegetables, and low fat dairy, alcohol moderation, and smoking cessation.;Monitor prescription use compliance.    Expected Outcomes  Short Term: Continued assessment and intervention until BP is < 140/2mm HG in hypertensive participants. < 130/42mm HG in hypertensive participants with diabetes, heart failure or chronic kidney disease.;Long Term: Maintenance of blood pressure at goal levels.    Lipids  Yes    Intervention  Provide education and support for participant on nutrition & aerobic/resistive exercise along with prescribed medications to achieve LDL 70mg , HDL >40mg .    Expected Outcomes  Short Term: Participant states understanding of desired cholesterol values and is compliant with medications prescribed. Participant is following exercise  prescription and nutrition guidelines.;Long Term: Cholesterol controlled with medications as prescribed, with individualized exercise RX and with personalized nutrition plan. Value goals: LDL < 70mg , HDL > 40 mg.    Stress  Yes    Intervention  Offer individual and/or small group education and counseling on adjustment to heart disease, stress management and health-related lifestyle change. Teach and support self-help strategies.;Refer participants experiencing significant psychosocial distress to appropriate mental health specialists for further evaluation and treatment. When possible, include family members and significant others in education/counseling sessions.    Expected Outcomes  Short Term: Participant demonstrates changes in health-related behavior, relaxation and other stress management skills, ability to obtain effective social support, and compliance with psychotropic medications if prescribed.;Long Term: Emotional wellbeing is indicated by absence of clinically significant psychosocial distress or social isolation.       Core Components/Risk Factors/Patient Goals Review:    Core Components/Risk Factors/Patient Goals at Discharge (Final Review):    ITP Comments: ITP Comments    Row Name 05/29/17 0932           ITP Comments  Dr. Fransico Him, Medical Director          Comments: Patient attended orientation from [id number] to 1038 to review rules and guidelines for program. Completed 6 minute walk test, Intitial ITP, and exercise prescription.  VSS. Telemetry-SR with PVC's.  Asymptomatic.  PA notified and no new orders received.  Pt tolerated well.  No psychosocial needs identified. No interventions necessary.

## 2017-05-29 NOTE — Progress Notes (Signed)
Travis Blair 67 y.o. male DOB: 1950-11-16 MRN: [id number]      Nutrition Note  1. S/P CABG x 3    Past Medical History:  Diagnosis Date  . Asthma    as a child  . Chronic kidney disease    Sees Dr [name]  . Coronary artery disease   . Diabetes mellitus without complication ([place])   . Hyperlipidemia   . Hypertension   . Myocardial infarction ([place])   . Obesity   . Pneumonia 2018  . Sleep apnea    Meds reviewed. Glipizide noted  HT: Ht Readings from Last 1 Encounters:  05/29/17 6\' 2"  (1.88 m)    WT: Wt Readings from Last 3 Encounters:  05/29/17 (!) 308 lb 13.8 oz (140.1 kg)  05/27/17 (!) 309 lb 12.8 oz (140.5 kg)  04/30/17 296 lb 9.6 oz (134.5 kg)     Body mass index is 39.66 kg/m.  Current tobacco use? No   Labs:  Lipid Panel  No results found for: CHOL, TRIG, HDL, CHOLHDL, VLDL, LDLCALC, LDLDIRECT  Lab Results  Component Value Date   HGBA1C 7.2 (H) 03/04/2017   CBG (last 3)  No results for input(s): GLUCAP in the last 72 hours.  Nutrition Note Spoke with pt. Nutrition plan and goals reviewed with pt. Pt is following Step 1 of the Therapeutic Lifestyle Changes diet. Pt wants to lose wt. Per discussion, pt has lost 60 lb over the past year. Per EMR, Pt wt is down 34 lb (10.5% decrease) over the past 3.5 years. Pt's current wt is up 12 lb over the past month (4.1% increase). Pt has been trying to lose wt by cutting down on carbs and salt. Wt loss tips reviewed.  Pt is diabetic. Last A1c indicates blood glucose not optimally controlled. Pt reports his A1c was rechecked in Feb 2019 and was 6.6. Pt checks CBG's 3 times a week. Fasting CBG's reportedly 94-125 mg/dL. Pt with dx of CHF. Pt is unable to avoid many salt foods due to frequency of eating out. Pt expressed understanding of the information reviewed. Pt aware of nutrition education classes offered and plans on attending nutrition classes.  Nutrition Diagnosis ? Food-and nutrition-related knowledge deficit  related to lack of exposure to information as related to diagnosis of: ? CVD ? DM  ? Obesity related to excessive energy intake as evidenced by a BMI of 39.7  Nutrition Intervention ? Pt's individual nutrition plan and goals reviewed with pt.  Nutrition Goal(s):  ? Pt to identify food quantities necessary to achieve weight loss of 6-24 lb (2.7-10.9 kg) at graduation from cardiac rehab. Pt's long-term goal wt is 240 lb.  ? Pt to cut down on beef or pork high in saturated fat. ? Pt to make good choices when eating out at restaurants. ? Improved blood glucose control as evidenced by pt's A1c trending from 7.2 toward less than 7.0.  Plan:  Pt to attend nutrition classes ? Nutrition I ? Nutrition II ? Portion Distortion ? Diabetes Blitz ? Diabetes Q & A Will provide client-centered nutrition education as part of interdisciplinary care.   Monitor and evaluate progress toward nutrition goal with team.  Derek Mound, M.Ed, RD, LDN, CDE 05/29/2017 11:30 AM

## 2017-06-02 ENCOUNTER — Encounter (HOSPITAL_COMMUNITY): Payer: PPO

## 2017-06-02 ENCOUNTER — Encounter (HOSPITAL_COMMUNITY)
Admission: RE | Admit: 2017-06-02 | Discharge: 2017-06-02 | Disposition: A | Discharge status: Home / Self Care | Payer: PPO | Source: Ambulatory Visit | Attending: Cardiovascular Disease | Admitting: Cardiovascular Disease

## 2017-06-02 DIAGNOSIS — Z951 Presence of aortocoronary bypass graft: Secondary | ICD-10-CM | POA: Diagnosis not present

## 2017-06-02 LAB — GLUCOSE, CAPILLARY
Glucose-Capillary: 139 mg/dL — ABNORMAL HIGH (ref 65–99)
Glucose-Capillary: 99 mg/dL (ref 65–99)

## 2017-06-02 NOTE — Progress Notes (Signed)
Daily Session Note  Patient Details  Name: Travis Blair MRN: [id number] Date of Birth: 04/24/1950 Referring Provider:     CARDIAC REHAB PHASE II ORIENTATION from 05/29/2017 in [facility]  Referring Provider  Nahser, Philip J MD      Encounter Date: 06/02/2017  Check In: Session Check In - 06/02/17 1540      Check-In   Location  [facility]    Staff Present  Maria Whitaker, RN, BSN;Tara Everett, RN BSN;Tyara Nevels, MS,ACSM CEP, Exercise Physiologist;Olinty Richards, MS, ACSM CEP, Exercise Physiologist    Supervising physician immediately available to respond to emergencies  [facility] immediately available    Physician(s)  Dr. Choi    Medication changes reported      No    Fall or balance concerns reported     No    Tobacco Cessation  No Change    Warm-up and Cool-down  Performed as group-led instruction    Resistance Training Performed  Yes    VAD Patient?  No      Pain Assessment   Currently in Pain?  No/denies       Capillary Blood Glucose: Results for orders placed or performed during the hospital encounter of 06/02/17 (from the past 24 hour(s))  Glucose, capillary     Status: Abnormal   Collection Time: 06/02/17  2:37 PM  Result Value Ref Range   Glucose-Capillary 139 (H) 65 - 99 mg/dL  Glucose, capillary     Status: None   Collection Time: 06/02/17  3:34 PM  Result Value Ref Range   Glucose-Capillary 99 65 - 99 mg/dL      Social History   Tobacco Use  Smoking Status Never Smoker  Smokeless Tobacco Never Used    Goals Met:  Exercise tolerated well  Goals Unmet:  Not Applicable  Comments: Travis Blair started cardiac rehab today.  Pt tolerated light exercise without difficulty. VSS, telemetry-Sinus Rhythm with PVC's , asymptomatic.  Medication list reconciled. Pt denies barriers to medicaiton compliance.  PSYCHOSOCIAL ASSESSMENT:  PHQ-0. Pt exhibits positive coping skills, hopeful outlook with  supportive family. No psychosocial needs identified at this time, no psychosocial interventions necessary.    Pt enjoys playing with his grandkids.   Pt oriented to exercise equipment and routine.    Understanding verbalized. Maria Whitaker, RN,BSN 06/03/2017 10:36 AM   Dr. Traci Turner is Medical Director for Cardiac Rehab at [facility]. 

## 2017-06-04 ENCOUNTER — Encounter (HOSPITAL_COMMUNITY)
Admission: RE | Admit: 2017-06-04 | Discharge: 2017-06-04 | Disposition: A | Discharge status: Home / Self Care | Payer: PPO | Source: Ambulatory Visit | Attending: Cardiovascular Disease | Admitting: Cardiovascular Disease

## 2017-06-04 ENCOUNTER — Encounter (HOSPITAL_COMMUNITY): Payer: PPO

## 2017-06-04 DIAGNOSIS — Z951 Presence of aortocoronary bypass graft: Secondary | ICD-10-CM | POA: Diagnosis not present

## 2017-06-04 LAB — GLUCOSE, CAPILLARY
GLUCOSE-CAPILLARY: 150 mg/dL — AB (ref 65–99)
GLUCOSE-CAPILLARY: 110 mg/dL — AB (ref 65–99)

## 2017-06-05 ENCOUNTER — Ambulatory Visit (INDEPENDENT_AMBULATORY_CARE_PROVIDER_SITE_OTHER): Payer: PPO | Admitting: Pharmacist

## 2017-06-05 DIAGNOSIS — E782 Mixed hyperlipidemia: Secondary | ICD-10-CM | POA: Diagnosis not present

## 2017-06-05 MED ORDER — ATORVASTATIN CALCIUM 10 MG PO TABS: 10.0000 mg | ORAL_TABLET | Freq: Every day | ORAL | 3 refills | Status: DC

## 2017-06-05 NOTE — Progress Notes (Signed)
Patient ID: [facility]                 DOB: 10/11/1950                    MRN: [id number]     HPI: Travis Blair is a 67 y.o. male patient of Dr. Acie Fredrickson that presents today for lipid evaluation.  PMH includes CAD s/p CABG, CHF, afib, DVT, CKD, HTN, HLD, DM, and OSA. He was recently seen for follow up with Dr. Acie Fredrickson and reports muscle symptoms while on rosuvastatin. This was discontinued and he was referred to the lipid clinic.   He presents today for discussion of cholesterol in good spirits. He is not fasting today and has not had a recent cholesterol panel checked per his report. He reports that he was having leg cramps and aching with the rosuvastatin and this did improve when the medication was stopped. He does not recall taking any other statin medications. He reports that his Lovaza was stopped by another provider because it added no benefit. He does believe that he has had elevated TG in the past.   Risk Factors: CAD s/p CABG 02/2016 LDL Goal: <70  Current Medications: none Intolerances: rosuvastatin 20mg  daily (leg cramps and leg aching - the symptoms did improve when discontinued), lovaza 2g BID (stopped by provider), fenofibrate 145mg  daily (does not recall)  Diet: Eats out occasionally. He eats at family restaurants mostly. He has been eating a lot of baked and broiled. He avoids fried foods. He does eat vegetables regularly. He does not do a lot of snacking between meals. He drinks water mostly. Rare soda or tea.   Exercise: He is active with work as a contraction. He is enrolled in cardiac rehab.   Family History: Cancer in his father, mother, and other; Heart disease in his father, mother, and other; Hyperlipidemia in his other; Hypertension in his other; Lymphoma in his sister  Social History: denies tobacco and alcohol  Labs: pending - he reports that he has had issues with TG previously thus will wait for fasting panel   Past Medical History:  Diagnosis Date   . Asthma    as a child  . Chronic kidney disease    Sees Dr [name]  . Coronary artery disease   . Diabetes mellitus without complication ([place])   . DVT (deep venous thrombosis) ([place])   . Hyperlipidemia   . Hypertension   . Myocardial infarction ([place])   . Obesity   . Pneumonia 2018  . Sleep apnea     Current Outpatient Medications on File Prior to Visit  Medication Sig Dispense Refill  . apixaban (ELIQUIS) 5 MG TABS tablet Take 1 tablet (5 mg total) by mouth 2 (two) times daily. 60 tablet 11  . aspirin EC 81 MG tablet Take 81 mg by mouth daily.    Marland Kitchen glipiZIDE (GLUCOTROL XL) 10 MG 24 hr tablet TAKE 1 TABLET BY MOUTH EVERY DAY IN THE MORNING 90 tablet 0   No current facility-administered medications on file prior to visit.     No Known Allergies  Assessment/Plan: Hyperlipidemia: Fasting lipid panel next week to get baseline.  Will start atorvastatin 10mg  daily as he will need to try at least one more statin before he will be eligible for PCSK9i therapy. If TG elevated on panel next week will consider addition of VASCEPA 2g BID with the recent evidence for cardiovascular risk reduction with REDUCE-IT trial. Advised to call  if he develops muscle symptoms otherwise follow up for repeat panel in 3 months with lipid clinic visit.    Thank you,  Lelan Pons. Patterson Hammersmith, [facility]  06/05/2017 7:23 AM

## 2017-06-05 NOTE — Patient Instructions (Addendum)
Blood work next week - fasting lipid panel  We will start atorvastatin 10mg  daily. Please call the clinic if you develop muscle pains or issues on the medication. [phone].   Depending on triglycerides we will consider starting Vascepa 2g twice daily - we will call you after your labs if needed.    Cholesterol Cholesterol is a fat. Your body needs a small amount of cholesterol. Cholesterol (plaque) may build up in your blood vessels (arteries). That makes you more likely to have a heart attack or stroke. You cannot feel your cholesterol level. Having a blood test is the only way to find out if your level is high. Keep your test results. Work with your doctor to keep your cholesterol at a good level. What do the results mean?  Total cholesterol is how much cholesterol is in your blood.  LDL is bad cholesterol. This is the type that can build up. Try to have low LDL.  HDL is good cholesterol. It cleans your blood vessels and carries LDL away. Try to have high HDL.  Triglycerides are fat that the body can store or burn for energy. What are good levels of cholesterol?  Total cholesterol below 200.  LDL below 100 is good for people who have health risks. LDL below 70 is good for people who have very high risks.  HDL above 40 is good. It is best to have HDL of 60 or higher.  Triglycerides below 150. How can I lower my cholesterol? Diet Follow your diet program as told by your doctor.  Choose fish, white meat chicken, or [place] that is roasted or baked. Try not to eat red meat, fried foods, sausage, or lunch meats.  Eat lots of fresh fruits and vegetables.  Choose whole grains, beans, pasta, potatoes, and cereals.  Choose olive oil, corn oil, or canola oil. Only use small amounts.  Try not to eat butter, mayonnaise, shortening, or palm kernel oils.  Try not to eat foods with trans fats.  Choose low-fat or nonfat dairy foods. ? Drink skim or nonfat milk. ? Eat low-fat or  nonfat yogurt and cheeses. ? Try not to drink whole milk or cream. ? Try not to eat ice cream, egg yolks, or full-fat cheeses.  Healthy desserts include angel food cake, ginger snaps, animal crackers, hard candy, popsicles, and low-fat or nonfat frozen yogurt. Try not to eat pastries, cakes, pies, and cookies.  Exercise Follow your exercise program as told by your doctor.  Be more active. Try gardening, walking, and taking the stairs.  Ask your doctor about ways that you can be more active.  Medicine  Take over-the-counter and prescription medicines only as told by your doctor. This information is not intended to replace advice given to you by your health care provider. Make sure you discuss any questions you have with your health care provider. Document Released: 04/26/2008 Document Revised: 08/30/2015 Document Reviewed: 08/10/2015 Elsevier Interactive Patient Education  [employer].

## 2017-06-06 ENCOUNTER — Encounter (HOSPITAL_COMMUNITY): Payer: PPO

## 2017-06-06 ENCOUNTER — Encounter: Payer: Self-pay | Admitting: Pharmacist

## 2017-06-09 ENCOUNTER — Encounter (HOSPITAL_COMMUNITY)
Admission: RE | Admit: 2017-06-09 | Discharge: 2017-06-09 | Disposition: A | Discharge status: Home / Self Care | Payer: PPO | Source: Ambulatory Visit | Attending: Cardiovascular Disease | Admitting: Cardiovascular Disease

## 2017-06-09 ENCOUNTER — Encounter (HOSPITAL_COMMUNITY): Payer: PPO

## 2017-06-09 DIAGNOSIS — Z951 Presence of aortocoronary bypass graft: Secondary | ICD-10-CM | POA: Diagnosis not present

## 2017-06-09 NOTE — Progress Notes (Signed)
  Subjective:  Patient ID: Travis Blair, male    DOB: 15-Dec-1950,  MRN: [id number]  Chief Complaint  Patient presents with  . Callouses    bilateral - looks better   67 y.o. male returns for diabetic foot care. Reports the calluses are doing better. Objective:   General AA&O x3. Normal mood and affect.  Vascular Dorsalis pedis pulses present 1+ bilaterally  Posterior tibial pulses absent bilaterally  Capillary refill normal to all digits. Pedal hair growth normal.  Neurologic Epicritic sensation present bilaterally. Protective sensation with 5.07 monofilament  present bilaterally. Vibratory sensation present bilaterally.  Dermatologic No open lesions. Interspaces clear of maceration.  Normal skin temperature and turgor. Hyperkeratotic lesions: Fifth MPJ bilateral nails: brittle, onychomycosis, thickening, elongation  Orthopedic: No history of amputation. MMT 5/5 in dorsiflexion, plantarflexion, inversion, and eversion. Normal lower extremity joint ROM without pain or crepitus.    Assessment & Plan:  Patient was evaluated and treated and all questions answered.  Diabetes with PAD, HPKs -Calluses improving. -Follow up in 2 months for routine foot care.  Return in about 2 months (around 07/31/2017).

## 2017-06-10 ENCOUNTER — Other Ambulatory Visit: Payer: PPO

## 2017-06-10 DIAGNOSIS — E782 Mixed hyperlipidemia: Secondary | ICD-10-CM

## 2017-06-10 LAB — LIPID PANEL
CHOL/HDL RATIO: 4.7 ratio (ref 0.0–5.0)
Cholesterol, Total: 174 mg/dL (ref 100–199)
HDL: 37 mg/dL — ABNORMAL LOW (ref >39)
LDL CALC: 110 mg/dL — AB (ref 0–99)
Triglycerides: 135 mg/dL (ref 0–149)
VLDL Cholesterol Cal: 27 mg/dL (ref 5–40)

## 2017-06-10 LAB — HEPATIC FUNCTION PANEL
ALBUMIN: 3.9 g/dL (ref 3.6–4.8)
ALT: 13 IU/L (ref 0–44)
AST: 12 IU/L (ref 0–40)
Alkaline Phosphatase: 56 IU/L (ref 39–117)
Bilirubin, Direct: 0.08 mg/dL (ref 0.00–0.40)
Total Protein: 6.2 g/dL (ref 6.0–8.5)

## 2017-06-11 ENCOUNTER — Encounter (HOSPITAL_COMMUNITY): Payer: PPO

## 2017-06-13 ENCOUNTER — Encounter (HOSPITAL_COMMUNITY): Payer: PPO

## 2017-06-16 ENCOUNTER — Encounter (HOSPITAL_COMMUNITY)
Admission: RE | Admit: 2017-06-16 | Discharge: 2017-06-16 | Disposition: A | Discharge status: Home / Self Care | Payer: PPO | Source: Ambulatory Visit | Attending: Cardiovascular Disease | Admitting: Cardiovascular Disease

## 2017-06-16 ENCOUNTER — Telehealth: Payer: Self-pay | Admitting: Cardiovascular Disease

## 2017-06-16 ENCOUNTER — Encounter (HOSPITAL_COMMUNITY): Payer: PPO

## 2017-06-16 ENCOUNTER — Encounter: Payer: Self-pay | Admitting: Cardiovascular Disease

## 2017-06-16 DIAGNOSIS — Z7982 Long term (current) use of aspirin: Secondary | ICD-10-CM | POA: Diagnosis not present

## 2017-06-16 DIAGNOSIS — I129 Hypertensive chronic kidney disease with stage 1 through stage 4 chronic kidney disease, or unspecified chronic kidney disease: Secondary | ICD-10-CM | POA: Diagnosis not present

## 2017-06-16 DIAGNOSIS — E1122 Type 2 diabetes mellitus with diabetic chronic kidney disease: Secondary | ICD-10-CM | POA: Diagnosis not present

## 2017-06-16 DIAGNOSIS — N189 Chronic kidney disease, unspecified: Secondary | ICD-10-CM | POA: Diagnosis not present

## 2017-06-16 DIAGNOSIS — Z79899 Other long term (current) drug therapy: Secondary | ICD-10-CM | POA: Diagnosis not present

## 2017-06-16 DIAGNOSIS — Z951 Presence of aortocoronary bypass graft: Secondary | ICD-10-CM

## 2017-06-16 NOTE — Telephone Encounter (Signed)
Called patient to ask about his symptoms from our [place] communication. He c/o left leg swelling that started last week along with a cough. He denies SOB and states he is currently participating in cardiac rehab without difficulty. He states he has hx of DVT in left leg; c/o tightness in left leg and denies swelling, pain, discoloration, inflammation, warmth or redness. The surgical incisions for his CABG are in the right leg and he denies concerns with RLE. 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 He reports he has not missed any doses of eliquis. I asked if symptoms mimic the past DVT he had and he denies, states he is not as concerned as his wife. I advised that I can talk to Dr. Acie Fredrickson about ordering a vascular u/s if he would like. He admits to eating out a lot and agrees to monitor and limit salt intake to 2 grams daily; he states he would like continue to monitor.I emphasized the importance of following low salt diet and advised him to call back with worsening symptoms. He thanked me for the call.

## 2017-06-16 NOTE — Telephone Encounter (Signed)
New Message   Patient is calling in reference to e-mail that she received. Please call to discuss.

## 2017-06-16 NOTE — Telephone Encounter (Signed)
I suspect this swelling may be due to eating more salt . We can get a lower ext ultrasound / duplex  But he has not missed any doses of Eliquis .  Perhaps see if he improves over the next several days by avoiding salt.

## 2017-06-18 ENCOUNTER — Encounter (HOSPITAL_COMMUNITY): Payer: PPO

## 2017-06-18 ENCOUNTER — Encounter (HOSPITAL_COMMUNITY)
Admission: RE | Admit: 2017-06-18 | Discharge: 2017-06-18 | Disposition: A | Discharge status: Home / Self Care | Payer: PPO | Source: Ambulatory Visit | Attending: Cardiovascular Disease | Admitting: Cardiovascular Disease

## 2017-06-18 DIAGNOSIS — Z951 Presence of aortocoronary bypass graft: Secondary | ICD-10-CM | POA: Diagnosis not present

## 2017-06-19 DIAGNOSIS — D2372 Other benign neoplasm of skin of left lower limb, including hip: Secondary | ICD-10-CM | POA: Diagnosis not present

## 2017-06-19 DIAGNOSIS — L821 Other seborrheic keratosis: Secondary | ICD-10-CM | POA: Diagnosis not present

## 2017-06-19 DIAGNOSIS — L570 Actinic keratosis: Secondary | ICD-10-CM | POA: Diagnosis not present

## 2017-06-20 ENCOUNTER — Encounter (HOSPITAL_COMMUNITY)
Admission: RE | Admit: 2017-06-20 | Discharge: 2017-06-20 | Disposition: A | Discharge status: Home / Self Care | Payer: PPO | Source: Ambulatory Visit | Attending: Cardiovascular Disease | Admitting: Cardiovascular Disease

## 2017-06-20 ENCOUNTER — Encounter (HOSPITAL_COMMUNITY): Payer: PPO

## 2017-06-20 DIAGNOSIS — Z951 Presence of aortocoronary bypass graft: Secondary | ICD-10-CM | POA: Diagnosis not present

## 2017-06-20 NOTE — Progress Notes (Signed)
Reviewed home exercise guidelines with patient including endpoints, temperature precautions, target heart rate and rate of perceived exertion. Pt plans to walk as his mode of home exercise. Pt voices understanding of instructions given.   Alby Schwabe M Oza [place], MS, ACSM CEP  

## 2017-06-23 ENCOUNTER — Encounter (HOSPITAL_COMMUNITY): Payer: PPO

## 2017-06-25 ENCOUNTER — Encounter: Payer: PPO | Admitting: Cardiothoracic Surgery

## 2017-06-25 ENCOUNTER — Encounter (HOSPITAL_COMMUNITY): Payer: PPO

## 2017-06-26 ENCOUNTER — Encounter: Payer: Self-pay | Admitting: Cardiothoracic Surgery

## 2017-06-26 ENCOUNTER — Encounter (HOSPITAL_COMMUNITY): Payer: Self-pay | Admitting: *Deleted

## 2017-06-26 ENCOUNTER — Ambulatory Visit: Payer: PPO | Admitting: Cardiothoracic Surgery

## 2017-06-26 ENCOUNTER — Other Ambulatory Visit: Payer: Self-pay

## 2017-06-26 VITALS — BP 120/68 | HR 70 | Resp 16 | Ht 74.0 in | Wt 309.0 lb

## 2017-06-26 DIAGNOSIS — Z951 Presence of aortocoronary bypass graft: Secondary | ICD-10-CM

## 2017-06-26 DIAGNOSIS — I2510 Atherosclerotic heart disease of native coronary artery without angina pectoris: Secondary | ICD-10-CM

## 2017-06-26 NOTE — Progress Notes (Signed)
PCP is Crist Infante, MD Referring Provider is [name], MD  Chief Complaint  Patient presents with  . Routine Post Op    2 month f/u, discuss medications    HPI: Patient returns for scheduled follow-up visit almost 6 months after CABG.  Preoperatively the patient had a DVT in his left leg and was placed on Eliquis for several weeks.  His leg pain has resolved however the chronic swelling persists.  He usually tries to wear his TEDs hose most days.  He has been compliant with the Eliquis.  Because of evidence of persistent venous insufficiency after a therapeutic course of anticoagulation patient will be referred for venous evaluation by Dr. Donzetta Matters at [facility].  He will continue Eliquis.  Patient is working full-time without angina or shortness of breath.  Unfortunately his weight is now up to 300 pounds.  Past Medical History:  Diagnosis Date  . Asthma    as a child  . Chronic kidney disease    Sees Dr [name]  . Coronary artery disease   . Diabetes mellitus without complication ([place])   . DVT (deep venous thrombosis) ([place])   . Hyperlipidemia   . Hypertension   . Myocardial infarction ([place])   . Obesity   . Pneumonia 2018  . Sleep apnea     Past Surgical History:  Procedure Laterality Date  . COLONOSCOPY    . CORONARY ARTERY BYPASS GRAFT N/A 03/07/2017   Procedure: CORONARY ARTERY BYPASS GRAFTING (CABG), X 3 , USING RIGHT INTERNAL MAMMARY ARTERY, AND RIGHT LEG GREATER SAPHENOUS VEIN HARVESTED ENDOSCOPICALLY;  Surgeon: [name], MD;  Location: [facility];  Service: Open Heart Surgery;  Laterality: N/A;  . HAND SURGERY    . RIGHT/LEFT HEART CATH AND CORONARY ANGIOGRAPHY N/A 12/20/2016   Procedure: RIGHT/LEFT HEART CATH AND CORONARY ANGIOGRAPHY;  Surgeon: [name], MD;  Location: [facility];  Service: Cardiovascular;  Laterality: N/A;  . TEE WITHOUT CARDIOVERSION N/A 03/07/2017   Procedure: TRANSESOPHAGEAL ECHOCARDIOGRAM (TEE);  Surgeon: [name], [name], MD;   Location: [facility];  Service: Open Heart Surgery;  Laterality: N/A;  . TONSILLECTOMY      Family History  Problem Relation Age of Onset  . Cancer Other   . Hypertension Other   . Hyperlipidemia Other   . Heart disease Other   . Cancer Mother   . Heart disease Mother   . Cancer Father   . Heart disease Father   . Lymphoma Sister     Social History Social History   Tobacco Use  . Smoking status: Never Smoker  . Smokeless tobacco: Never Used  Substance Use Topics  . Alcohol use: No  . Drug use: No    Current Outpatient Medications  Medication Sig Dispense Refill  . apixaban (ELIQUIS) 5 MG TABS tablet Take 1 tablet (5 mg total) by mouth 2 (two) times daily. 60 tablet 11  . aspirin EC 81 MG tablet Take 81 mg by mouth daily.    Marland Kitchen atorvastatin (LIPITOR) 10 MG tablet Take 1 tablet (10 mg total) by mouth daily. 30 tablet 3  . glipiZIDE (GLUCOTROL XL) 10 MG 24 hr tablet TAKE 1 TABLET BY MOUTH EVERY DAY IN THE MORNING 90 tablet 0   No current facility-administered medications for this visit.     No Known Allergies  Review of Systems   No angina or shortness of breath Slight dizziness with change in position probably related to his obesity and venous disease Surgical incision  is healing well No bleeding complications from Eliquis  BP 120/68 (BP Location: Right Arm, Patient Position: Sitting, Cuff Size: Large)   Pulse 70   Resp 16   Ht 6\' 2"  (1.88 m)   Wt (!) 309 lb (140.2 kg)   SpO2 97% Comment: ON RA  BMI 39.67 kg/m  Physical Exam      Exam    General- alert and comfortable    Neck- no JVD, no cervical adenopathy palpable, no carotid bruit   Lungs- clear without rales, wheezes   Cor- regular rate and rhythm, no murmur , gallop   Abdomen- soft, non-tender   Extremities - warm, non-tender, minimal edema   Neuro- oriented, appropriate, no focal weakness   Diagnostic Tests: None  Impression: Patient has recovered from CABG and ALL restrictions are not lifted. He  will complete his outpatient cardiac rehab and continue current medications  He has chronic venous insufficiency of the left leg and will continue anticoagulation until he is evaluated and recommendations are made by Dr. Donzetta Matters Plan: Return in 3 months for follow-up.   Len Childs, MD [facility] [phone]

## 2017-06-26 NOTE — Progress Notes (Signed)
Cardiac Individual Treatment Plan  Patient Details  Name: Travis Blair MRN: [id number] Date of Birth: [birth year] Referring Provider:     CARDIAC REHAB PHASE II ORIENTATION from 05/29/2017 in [facility]  Referring Provider  Nahser, Wonda Cheng MD      Initial Encounter Date:    CARDIAC REHAB PHASE II ORIENTATION from 05/29/2017 in [facility]  Date  05/29/17  Referring Provider  Nahser, Wonda Cheng MD      Visit Diagnosis: S/P CABG x 3  Patient's Home Medications on Admission:  Current Outpatient Medications:  .  apixaban (ELIQUIS) 5 MG TABS tablet, Take 1 tablet (5 mg total) by mouth 2 (two) times daily., Disp: 60 tablet, Rfl: 11 .  aspirin EC 81 MG tablet, Take 81 mg by mouth daily., Disp: , Rfl:  .  atorvastatin (LIPITOR) 10 MG tablet, Take 1 tablet (10 mg total) by mouth daily., Disp: 30 tablet, Rfl: 3 .  glipiZIDE (GLUCOTROL XL) 10 MG 24 hr tablet, TAKE 1 TABLET BY MOUTH EVERY DAY IN THE MORNING, Disp: 90 tablet, Rfl: 0  Past Medical History: Past Medical History:  Diagnosis Date  . Asthma    as a child  . Chronic kidney disease    Sees Dr [name]  . Coronary artery disease   . Diabetes mellitus without complication ([place])   . DVT (deep venous thrombosis) ([place])   . Hyperlipidemia   . Hypertension   . Myocardial infarction ([place])   . Obesity   . Pneumonia 2018  . Sleep apnea     Tobacco Use: Social History   Tobacco Use  Smoking Status Never Smoker  Smokeless Tobacco Never Used    Labs: Recent Review Flowsheet Data    Labs for ITP Cardiac and Pulmonary Rehab Latest Ref Rng & Units 03/07/2017 03/07/2017 03/07/2017 03/08/2017 06/10/2017   Cholestrol 100 - 199 mg/dL - - - - 174   LDLCALC 0 - 99 mg/dL - - - - 110(H)   HDL >39 mg/dL - - - - 37(L)   Trlycerides 0 - 149 mg/dL - - - - 135   Hemoglobin A1c 4.8 - 5.6 % - - - - -   PHART 7.350 - 7.450 7.365 7.311(L) - - -   PCO2ART 32.0 - 48.0 mmHg 40.8 42.6 - -  -   HCO3 20.0 - 28.0 mmol/L 23.4 21.6 - - -   TCO2 22 - 32 mmol/L 25 23 25 22  -   ACIDBASEDEF 0.0 - 2.0 mmol/L 2.0 5.0(H) - - -   O2SAT % 100.0 98.0 - - -      Capillary Blood Glucose: Lab Results  Component Value Date   GLUCAP 110 (H) 06/04/2017   GLUCAP 150 (H) 06/04/2017   GLUCAP 99 06/02/2017   GLUCAP 139 (H) 06/02/2017   GLUCAP 192 (H) 03/13/2017     Exercise Target Goals:    Exercise Program Goal: Individual exercise prescription set using results from initial 6 min walk test and THRR while considering  patient's activity barriers and safety.   Exercise Prescription Goal: Initial exercise prescription builds to 30-45 minutes a day of aerobic activity, 2-3 days per week.  Home exercise guidelines will be given to patient during program as part of exercise prescription that the participant will acknowledge.  Activity Barriers & Risk Stratification: Activity Barriers & Cardiac Risk Stratification - 05/29/17 0932      Activity Barriers & Cardiac Risk Stratification   Activity Barriers  Back Problems    Cardiac Risk Stratification  High       6 Minute Walk: 6 Minute Walk    Row Name 05/29/17 1111 05/29/17 1130       6 Minute Walk   Phase  Initial  -    Distance  1279 feet  -    Walk Time  6 minutes  -    # of Rest Breaks  0  -    MPH  2.4  -    METS  2.5  -    RPE  12  -    Perceived Dyspnea   0  -    Symptoms  No  -    Resting HR  69 bpm  -    Resting BP  128/60  -    Resting Oxygen Saturation   99 %  -    Exercise Oxygen Saturation  during 6 min walk  100 %  -    Max Ex. HR  96 bpm  -    Max Ex. BP  164/80  -    2 Minute Post BP  154/72  118/84       Oxygen Initial Assessment:   Oxygen Re-Evaluation:   Oxygen Discharge (Final Oxygen Re-Evaluation):   Initial Exercise Prescription: Initial Exercise Prescription - 05/29/17 1100      Date of Initial Exercise RX and Referring Provider   Date  05/29/17    Referring Provider  Nahser, Wonda Cheng  MD      Bike   Level  0.7    Minutes  10    METs  2.26      NuStep   Level  3    SPM  75    Minutes  10    METs  2.2      Track   Laps  9    Minutes  10    METs  2.53      Prescription Details   Frequency (times per week)  3x    Duration  Progress to 30 minutes of continuous aerobic without signs/symptoms of physical distress      Intensity   THRR 40-80% of Max Heartrate  62-125    Ratings of Perceived Exertion  11-13    Perceived Dyspnea  0-4      Progression   Progression  Continue progressive overload as per policy without signs/symptoms or physical distress.      Resistance Training   Training Prescription  Yes    Weight  4lbs    Reps  10-15       Perform Capillary Blood Glucose checks as needed.  Exercise Prescription Changes: Exercise Prescription Changes    Row Name 06/02/17 1451 06/16/17 1449           Response to Exercise   Blood Pressure (Admit)  142/70  126/74      Blood Pressure (Exercise)  144/70  144/68      Blood Pressure (Exit)  130/70  124/68      Heart Rate (Admit)  76 bpm  75 bpm      Heart Rate (Exercise)  100 bpm  100 bpm      Heart Rate (Exit)  83 bpm  81 bpm      Rating of Perceived Exertion (Exercise)  13  13      Symptoms  none  none      Duration  Progress to 30 minutes of  aerobic without signs/symptoms of  physical distress  Progress to 30 minutes of  aerobic without signs/symptoms of physical distress      Intensity  THRR unchanged  THRR unchanged        Progression   Progression  Continue to progress workloads to maintain intensity without signs/symptoms of physical distress.  Continue to progress workloads to maintain intensity without signs/symptoms of physical distress.      Average METs  2.4  2.5        Resistance Training   Training Prescription  Yes  Yes      Weight  4lbs  4lbs      Reps  10-15  10-15      Time  10 Minutes  10 Minutes        Interval Training   Interval Training  No  No        Bike   Level  0.7   1.2      Minutes  10  10      METs  1.94  2.62        NuStep   Level  3  3      SPM  75  75      Minutes  10  10      METs  2.4  2.1        Track   Laps  11  10      Minutes  10  10      METs  2.92  2.74         Exercise Comments: Exercise Comments    Row Name 06/02/17 1451 06/18/17 1515 06/20/17 1508       Exercise Comments  Off to a good start with exercise.  Reviewed METs and goals with patient.  Reviewed home exercise prescription with patient.        Exercise Goals and Review: Exercise Goals    Row Name 05/29/17 1116             Exercise Goals   Increase Physical Activity  Yes       Intervention  Provide advice, education, support and counseling about physical activity/exercise needs.;Develop an individualized exercise prescription for aerobic and resistive training based on initial evaluation findings, risk stratification, comorbidities and participant's personal goals.       Expected Outcomes  Short Term: Attend rehab on a regular basis to increase amount of physical activity.;Long Term: Add in home exercise to make exercise part of routine and to increase amount of physical activity.;Long Term: Exercising regularly at least 3-5 days a week.       Increase Strength and Stamina  Yes       Intervention  Provide advice, education, support and counseling about physical activity/exercise needs.;Develop an individualized exercise prescription for aerobic and resistive training based on initial evaluation findings, risk stratification, comorbidities and participant's personal goals.       Expected Outcomes  Short Term: Increase workloads from initial exercise prescription for resistance, speed, and METs.;Short Term: Perform resistance training exercises routinely during rehab and add in resistance training at home;Long Term: Improve cardiorespiratory fitness, muscular endurance and strength as measured by increased METs and functional capacity (6MWT)       Able to understand  and use rate of perceived exertion (RPE) scale  Yes       Intervention  Provide education and explanation on how to use RPE scale       Expected Outcomes  Long Term:  Able to use RPE to  guide intensity level when exercising independently;Short Term: Able to use RPE daily in rehab to express subjective intensity level       Knowledge and understanding of Target Heart Rate Range (THRR)  Yes       Intervention  Provide education and explanation of THRR including how the numbers were predicted and where they are located for reference       Expected Outcomes  Short Term: Able to state/look up THRR;Short Term: Able to use daily as guideline for intensity in rehab;Long Term: Able to use THRR to govern intensity when exercising independently       Able to check pulse independently  Yes       Intervention  Provide education and demonstration on how to check pulse in carotid and radial arteries.;Review the importance of being able to check your own pulse for safety during independent exercise       Expected Outcomes  Short Term: Able to explain why pulse checking is important during independent exercise;Long Term: Able to check pulse independently and accurately       Understanding of Exercise Prescription  Yes       Intervention  Provide education, explanation, and written materials on patient's individual exercise prescription       Expected Outcomes  Short Term: Able to explain program exercise prescription;Long Term: Able to explain home exercise prescription to exercise independently          Exercise Goals Re-Evaluation : Exercise Goals Re-Evaluation    Row Name 06/20/17 5732 06/20/17 1508           Exercise Goal Re-Evaluation   Exercise Goals Review  Increase Physical Activity;Able to understand and use rate of perceived exertion (RPE) scale  Understanding of Exercise Prescription;Knowledge and understanding of Target Heart Rate Range (THRR)      Comments  Patient is off to a good start wirh  exercise. Pt understands and is able to use the RPE scale appropriately.  Patient is off to a good start wirh exercise. Pt understands and is able to use the RPE scale appropriately. Reviewed home exercise guidelines with patient including THRR, RPE scale, and endpoints for exercise.      Expected Outcomes  Increase workloads as tolerated to help achieve personal health and fitness goals.  Patient will add 30 minutes of walking, 1-2 days/week, in addition to exercise at [facility].          Discharge Exercise Prescription (Final Exercise Prescription Changes): Exercise Prescription Changes - 06/16/17 1449      Response to Exercise   Blood Pressure (Admit)  126/74    Blood Pressure (Exercise)  144/68    Blood Pressure (Exit)  124/68    Heart Rate (Admit)  75 bpm    Heart Rate (Exercise)  100 bpm    Heart Rate (Exit)  81 bpm    Rating of Perceived Exertion (Exercise)  13    Symptoms  none    Duration  Progress to 30 minutes of  aerobic without signs/symptoms of physical distress    Intensity  THRR unchanged      Progression   Progression  Continue to progress workloads to maintain intensity without signs/symptoms of physical distress.    Average METs  2.5      Resistance Training   Training Prescription  Yes    Weight  4lbs    Reps  10-15    Time  10 Minutes      Interval Training   Interval Training  No      Bike   Level  1.2    Minutes  10    METs  2.62      NuStep   Level  3    SPM  75    Minutes  10    METs  2.1      Track   Laps  10    Minutes  10    METs  2.74       Nutrition:  Target Goals: Understanding of nutrition guidelines, daily intake of sodium 1500mg , cholesterol 200mg , calories 30% from fat and 7% or less from saturated fats, daily to have 5 or more servings of fruits and vegetables.  Biometrics: Pre Biometrics - 05/29/17 1117      Pre Biometrics   Height  6\' 2"  (1.88 m)    Weight  308 lb 13.8 oz (140.1 kg)  (Abnormal)     Waist Circumference  54  inches    Hip Circumference  49.5 inches    Waist to Hip Ratio  1.09 %    BMI (Calculated)  39.64    Triceps Skinfold  20 mm    % Body Fat  38.9 %    Grip Strength  14 kg    Flexibility  14 in    Single Leg Stand  7 seconds        Nutrition Therapy Plan and Nutrition Goals: Nutrition Therapy & Goals - 05/29/17 1210      Nutrition Therapy   Diet  Carb Modified, Heart Healthy      Personal Nutrition Goals   Nutrition Goal  Pt to identify food quantities necessary to achieve weight loss of 6-24 lb (2.7-10.9 kg) at graduation from cardiac rehab. Pt's long-term goal wt is 240 lb.     Personal Goal #2  Pt to cut down on beef or pork high in saturated fat.    Personal Goal #3  Pt to make good choices when eating out at restaurants.    Personal Goal #4  Improved blood glucose control as evidenced by pt's A1c trending from 7.2 toward less than 7.0.      Intervention Plan   Intervention  Prescribe, educate and counsel regarding individualized specific dietary modifications aiming towards targeted core components such as weight, hypertension, lipid management, diabetes, heart failure and other comorbidities.    Expected Outcomes  Short Term Goal: Understand basic principles of dietary content, such as calories, fat, sodium, cholesterol and nutrients.;Long Term Goal: Adherence to prescribed nutrition plan.       Nutrition Assessments: Nutrition Assessments - 05/29/17 1210      MEDFICTS Scores   Pre Score  66       Nutrition Goals Re-Evaluation:   Nutrition Goals Re-Evaluation:   Nutrition Goals Discharge (Final Nutrition Goals Re-Evaluation):   Psychosocial: Target Goals: Acknowledge presence or absence of significant depression and/or stress, maximize coping skills, provide positive support system. Participant is able to verbalize types and ability to use techniques and skills needed for reducing stress and depression.  Initial Review & Psychosocial Screening: Initial Psych  Review & Screening - 05/29/17 1146      Initial Review   Current issues with  None Identified      Family Dynamics   Good Support System?  Yes Pt reports that his wife is a part of his good support system.     Comments  No psychosocial needs identified. No interventions necessary.       Barriers  Psychosocial barriers to participate in program  There are no identifiable barriers or psychosocial needs.      Screening Interventions   Interventions  Encouraged to exercise    Expected Outcomes  Short Term goal: Identification and review with participant of any Quality of Life or Depression concerns found by scoring the questionnaire.;Long Term goal: The participant improves quality of Life and PHQ9 Scores as seen by post scores and/or verbalization of changes       Quality of Life Scores: Quality of Life - 05/29/17 1118      Quality of Life Scores   Health/Function Pre  21.36 %    Socioeconomic Pre  25.07 %    Psych/Spiritual Pre  24.93 %    Family Pre  26.3 %    GLOBAL Pre  23.65 %      Scores of 19 and below usually indicate a poorer quality of life in these areas.  A difference of  2-3 points is a clinically meaningful difference.  A difference of 2-3 points in the total score of the Quality of Life Index has been associated with significant improvement in overall quality of life, self-image, physical symptoms, and general health in studies assessing change in quality of life.  PHQ-9: Recent Review Flowsheet Data    Depression screen [facility] 2/9 06/02/2017 10/14/2013   Decreased Interest 0 0   Down, Depressed, Hopeless 0 0   PHQ - 2 Score 0 0     Interpretation of Total Score  Total Score Depression Severity:  1-4 = Minimal depression, 5-9 = Mild depression, 10-14 = Moderate depression, 15-19 = Moderately severe depression, 20-27 = Severe depression   Psychosocial Evaluation and Intervention:   Psychosocial Re-Evaluation:   Psychosocial Discharge (Final Psychosocial  Re-Evaluation):   Vocational Rehabilitation: Provide vocational rehab assistance to qualifying candidates.   Vocational Rehab Evaluation & Intervention: Vocational Rehab - 05/29/17 1145      Initial Vocational Rehab Evaluation & Intervention   Assessment shows need for Vocational Rehabilitation  No       Education: Education Goals: Education classes will be provided on a weekly basis, covering required topics. Participant will state understanding/return demonstration of topics presented.  Learning Barriers/Preferences: Learning Barriers/Preferences - 05/29/17 0931      Learning Barriers/Preferences   Learning Barriers  Sight    Learning Preferences  Pictoral;Skilled Demonstration;Video       Education Topics: Count Your Pulse:  -Group instruction provided by verbal instruction, demonstration, patient participation and written materials to support subject.  Instructors address importance of being able to find your pulse and how to count your pulse when at home without a heart monitor.  Patients get hands on experience counting their pulse with staff help and individually.   Heart Attack, Angina, and Risk Factor Modification:  -Group instruction provided by verbal instruction, video, and written materials to support subject.  Instructors address signs and symptoms of angina and heart attacks.    Also discuss risk factors for heart disease and how to make changes to improve heart health risk factors.   Functional Fitness:  -Group instruction provided by verbal instruction, demonstration, patient participation, and written materials to support subject.  Instructors address safety measures for doing things around the house.  Discuss how to get up and down off the floor, how to pick things up properly, how to safely get out of a chair without assistance, and balance training.   Meditation and Mindfulness:  -Group instruction provided by verbal instruction, patient participation, and  written materials to support subject.  Instructor addresses importance of mindfulness and meditation practice to help reduce stress and improve awareness.  Instructor also leads participants through a meditation exercise.    Stretching for Flexibility and Mobility:  -Group instruction provided by verbal instruction, patient participation, and written materials to support subject.  Instructors lead participants through series of stretches that are designed to increase flexibility thus improving mobility.  These stretches are additional exercise for major muscle groups that are typically performed during regular warm up and cool down.   Hands Only CPR:  -Group verbal, video, and participation provides a basic overview of AHA guidelines for community CPR. Role-play of emergencies allow participants the opportunity to practice calling for help and chest compression technique with discussion of AED use.   Hypertension: -Group verbal and written instruction that provides a basic overview of hypertension including the most recent diagnostic guidelines, risk factor reduction with self-care instructions and medication management.    Nutrition I class: Heart Healthy Eating:  -Group instruction provided by PowerPoint slides, verbal discussion, and written materials to support subject matter. The instructor gives an explanation and review of the Therapeutic Lifestyle Changes diet recommendations, which includes a discussion on lipid goals, dietary fat, sodium, fiber, plant stanol/sterol esters, sugar, and the components of a well-balanced, healthy diet.   Nutrition II class: Lifestyle Skills:  -Group instruction provided by PowerPoint slides, verbal discussion, and written materials to support subject matter. The instructor gives an explanation and review of label reading, grocery shopping for heart health, heart healthy recipe modifications, and ways to make healthier choices when eating out.   Diabetes  Question & Answer:  -Group instruction provided by PowerPoint slides, verbal discussion, and written materials to support subject matter. The instructor gives an explanation and review of diabetes co-morbidities, pre- and post-prandial blood glucose goals, pre-exercise blood glucose goals, signs, symptoms, and treatment of hypoglycemia and hyperglycemia, and foot care basics.   CARDIAC REHAB PHASE II EXERCISE from 06/20/2017 in [facility]  Date  06/20/17  Educator  RD  Instruction Review Code  2- Demonstrated Understanding      Diabetes Blitz:  -Group instruction provided by PowerPoint slides, verbal discussion, and written materials to support subject matter. The instructor gives an explanation and review of the physiology behind type 1 and type 2 diabetes, diabetes medications and rational behind using different medications, pre- and post-prandial blood glucose recommendations and Hemoglobin A1c goals, diabetes diet, and exercise including blood glucose guidelines for exercising safely.    Portion Distortion:  -Group instruction provided by PowerPoint slides, verbal discussion, written materials, and food models to support subject matter. The instructor gives an explanation of serving size versus portion size, changes in portions sizes over the last 20 years, and what consists of a serving from each food group.   Stress Management:  -Group instruction provided by verbal instruction, video, and written materials to support subject matter.  Instructors review role of stress in heart disease and how to cope with stress positively.     Exercising on Your Own:  -Group instruction provided by verbal instruction, power point, and written materials to support subject.  Instructors discuss benefits of exercise, components of exercise, frequency and intensity of exercise, and end points for exercise.  Also discuss use of nitroglycerin and activating EMS.  Review options  of places to exercise outside of rehab.  Review guidelines for sex with heart disease.   Cardiac Drugs I:  -Group instruction provided by  verbal instruction and written materials to support subject.  Instructor reviews cardiac drug classes: antiplatelets, anticoagulants, beta blockers, and statins.  Instructor discusses reasons, side effects, and lifestyle considerations for each drug class.   CARDIAC REHAB PHASE II EXERCISE from 06/20/2017 in [facility]  Date  06/18/17  Educator  -- [pharmacy]  Instruction Review Code  2- Demonstrated Understanding      Cardiac Drugs II:  -Group instruction provided by verbal instruction and written materials to support subject.  Instructor reviews cardiac drug classes: angiotensin converting enzyme inhibitors (ACE-I), angiotensin II receptor blockers (ARBs), nitrates, and calcium channel blockers.  Instructor discusses reasons, side effects, and lifestyle considerations for each drug class.   Anatomy and Physiology of the Circulatory System:  Group verbal and written instruction and models provide basic cardiac anatomy and physiology, with the coronary electrical and arterial systems. Review of: AMI, Angina, Valve disease, Heart Failure, Peripheral Artery Disease, Cardiac Arrhythmia, Pacemakers, and the ICD.   Other Education:  -Group or individual verbal, written, or video instructions that support the educational goals of the cardiac rehab program.   Holiday Eating Survival Tips:  -Group instruction provided by PowerPoint slides, verbal discussion, and written materials to support subject matter. The instructor gives patients tips, tricks, and techniques to help them not only survive but enjoy the holidays despite the onslaught of food that accompanies the holidays.   Knowledge Questionnaire Score: Knowledge Questionnaire Score - 05/29/17 1107      Knowledge Questionnaire Score   Pre Score  22/24       Core  Components/Risk Factors/Patient Goals at Admission: Personal Goals and Risk Factors at Admission - 05/29/17 1109      Core Components/Risk Factors/Patient Goals on Admission    Weight Management  Yes;Weight Loss;Obesity    Intervention  Weight Management: Develop a combined nutrition and exercise program designed to reach desired caloric intake, while maintaining appropriate intake of nutrient and fiber, sodium and fats, and appropriate energy expenditure required for the weight goal.;Weight Management/Obesity: Establish reasonable short term and long term weight goals.;Weight Management: Provide education and appropriate resources to help participant work on and attain dietary goals.;Obesity: Provide education and appropriate resources to help participant work on and attain dietary goals.    Admit Weight  308 lb 13.8 oz (140.1 kg)    Goal Weight: Short Term  300 lb (136.1 kg)    Goal Weight: Long Term  290 lb (131.5 kg)    Expected Outcomes  Short Term: Continue to assess and modify interventions until short term weight is achieved;Long Term: Adherence to nutrition and physical activity/exercise program aimed toward attainment of established weight goal;Weight Loss: Understanding of general recommendations for a balanced deficit meal plan, which promotes 1-2 lb weight loss per week and includes a negative energy balance of [id number] kcal/d;Understanding recommendations for meals to include 15-35% energy as protein, 25-35% energy from fat, 35-60% energy from carbohydrates, less than 200mg  of dietary cholesterol, 20-35 gm of total fiber daily;Understanding of distribution of calorie intake throughout the day with the consumption of 4-5 meals/snacks    Diabetes  Yes    Intervention  Provide education about signs/symptoms and action to take for hypo/hyperglycemia.;Provide education about proper nutrition, including hydration, and aerobic/resistive exercise prescription along with prescribed medications to  achieve blood glucose in normal ranges: Fasting glucose 65-99 mg/dL    Expected Outcomes  Short Term: Participant verbalizes understanding of the signs/symptoms and immediate care of hyper/hypoglycemia, proper foot care and importance of  medication, aerobic/resistive exercise and nutrition plan for blood glucose control.;Long Term: Attainment of HbA1C < 7%.    Hypertension  Yes    Intervention  Provide education on lifestyle modifcations including regular physical activity/exercise, weight management, moderate sodium restriction and increased consumption of fresh fruit, vegetables, and low fat dairy, alcohol moderation, and smoking cessation.;Monitor prescription use compliance.    Expected Outcomes  Short Term: Continued assessment and intervention until BP is < 140/5mm HG in hypertensive participants. < 130/37mm HG in hypertensive participants with diabetes, heart failure or chronic kidney disease.;Long Term: Maintenance of blood pressure at goal levels.    Lipids  Yes    Intervention  Provide education and support for participant on nutrition & aerobic/resistive exercise along with prescribed medications to achieve LDL 70mg , HDL >40mg .    Expected Outcomes  Short Term: Participant states understanding of desired cholesterol values and is compliant with medications prescribed. Participant is following exercise prescription and nutrition guidelines.;Long Term: Cholesterol controlled with medications as prescribed, with individualized exercise RX and with personalized nutrition plan. Value goals: LDL < 70mg , HDL > 40 mg.    Stress  Yes    Intervention  Offer individual and/or small group education and counseling on adjustment to heart disease, stress management and health-related lifestyle change. Teach and support self-help strategies.;Refer participants experiencing significant psychosocial distress to appropriate mental health specialists for further evaluation and treatment. When possible, include  family members and significant others in education/counseling sessions.    Expected Outcomes  Short Term: Participant demonstrates changes in health-related behavior, relaxation and other stress management skills, ability to obtain effective social support, and compliance with psychotropic medications if prescribed.;Long Term: Emotional wellbeing is indicated by absence of clinically significant psychosocial distress or social isolation.       Core Components/Risk Factors/Patient Goals Review:  Goals and Risk Factor Review    Row Name 06/26/17 1513             Core Components/Risk Factors/Patient Goals Review   Personal Goals Review  Weight Management/Obesity;Lipids;Hypertension;Diabetes;Stress       Review  Jamies attendance has been fair. [name] has been doing well with exercise. [name] has not lost any weight to date. [name] has had some moderate exertional BP elevations.       Expected Outcomes  Patient will partcipate in phase 2 cardiac rehab on a more regular basis and take his medicaitons as prescribed          Core Components/Risk Factors/Patient Goals at Discharge (Final Review):  Goals and Risk Factor Review - 06/26/17 1513      Core Components/Risk Factors/Patient Goals Review   Personal Goals Review  Weight Management/Obesity;Lipids;Hypertension;Diabetes;Stress    Review  Jamies attendance has been fair. [name] has been doing well with exercise. [name] has not lost any weight to date. [name] has had some moderate exertional BP elevations.    Expected Outcomes  Patient will partcipate in phase 2 cardiac rehab on a more regular basis and take his medicaitons as prescribed       ITP Comments: ITP Comments    Row Name 05/29/17 0932 06/26/17 1511         ITP Comments  Dr. Fransico Him, Medical Director  30 Day ITP review. Patient with [facility] attendance and partcipation in phase 2 cardiac rehab.         Comments: See ITP comments.Barnet Pall, RN,BSN 06/26/2017 3:21 PM

## 2017-06-27 ENCOUNTER — Encounter (HOSPITAL_COMMUNITY)
Admission: RE | Admit: 2017-06-27 | Discharge: 2017-06-27 | Disposition: A | Discharge status: Home / Self Care | Payer: PPO | Source: Ambulatory Visit | Attending: Cardiovascular Disease | Admitting: Cardiovascular Disease

## 2017-06-27 ENCOUNTER — Encounter (HOSPITAL_COMMUNITY): Payer: PPO

## 2017-06-27 DIAGNOSIS — Z951 Presence of aortocoronary bypass graft: Secondary | ICD-10-CM | POA: Diagnosis not present

## 2017-06-30 ENCOUNTER — Telehealth (HOSPITAL_COMMUNITY): Payer: Self-pay | Admitting: Internal Medicine

## 2017-06-30 ENCOUNTER — Encounter (HOSPITAL_COMMUNITY): Payer: PPO

## 2017-07-02 ENCOUNTER — Encounter (HOSPITAL_COMMUNITY): Payer: PPO

## 2017-07-04 ENCOUNTER — Encounter (HOSPITAL_COMMUNITY): Payer: PPO

## 2017-07-04 ENCOUNTER — Telehealth (HOSPITAL_COMMUNITY): Payer: Self-pay | Admitting: Internal Medicine

## 2017-07-04 ENCOUNTER — Telehealth: Payer: Self-pay | Admitting: Pharmacist

## 2017-07-04 NOTE — Telephone Encounter (Signed)
Called pt to follow up on tolerability of atorvastatin. He states that he never actually started the medication, but plans to do so next week. Advised that he does start. Will keep lab apt as scheduled for 2 months from now. He is advised to call if he has issues tolerating atorvastatin.

## 2017-07-09 ENCOUNTER — Encounter (HOSPITAL_COMMUNITY): Payer: PPO

## 2017-07-11 ENCOUNTER — Encounter (HOSPITAL_COMMUNITY): Payer: PPO

## 2017-07-14 ENCOUNTER — Telehealth: Payer: Self-pay | Admitting: Cardiovascular Disease

## 2017-07-14 ENCOUNTER — Encounter (HOSPITAL_COMMUNITY): Payer: PPO

## 2017-07-14 ENCOUNTER — Encounter (HOSPITAL_COMMUNITY)
Admission: RE | Admit: 2017-07-14 | Discharge: 2017-07-14 | Disposition: A | Discharge status: Home / Self Care | Payer: PPO | Source: Ambulatory Visit | Attending: Cardiovascular Disease | Admitting: Cardiovascular Disease

## 2017-07-14 DIAGNOSIS — Z79899 Other long term (current) drug therapy: Secondary | ICD-10-CM | POA: Insufficient documentation

## 2017-07-14 DIAGNOSIS — Z951 Presence of aortocoronary bypass graft: Secondary | ICD-10-CM | POA: Diagnosis not present

## 2017-07-14 DIAGNOSIS — Z7982 Long term (current) use of aspirin: Secondary | ICD-10-CM | POA: Diagnosis not present

## 2017-07-14 DIAGNOSIS — N189 Chronic kidney disease, unspecified: Secondary | ICD-10-CM | POA: Insufficient documentation

## 2017-07-14 DIAGNOSIS — E1122 Type 2 diabetes mellitus with diabetic chronic kidney disease: Secondary | ICD-10-CM | POA: Diagnosis not present

## 2017-07-14 DIAGNOSIS — I129 Hypertensive chronic kidney disease with stage 1 through stage 4 chronic kidney disease, or unspecified chronic kidney disease: Secondary | ICD-10-CM | POA: Insufficient documentation

## 2017-07-14 MED ORDER — TORSEMIDE 20 MG PO TABS: ORAL_TABLET | ORAL | 0 refills | Status: DC

## 2017-07-14 NOTE — Telephone Encounter (Signed)
NEW MESSAGE  Pt c/o swelling: STAT is pt has developed SOB within 24 hours  1) How much weight have you gained and in what time span? 8 lbs in 1 week  2) If swelling, where is the swelling located? LEGS, ANKLES  3) Are you currently taking a fluid pill? NO  4) Are you currently SOB? NO  5) Do you have a log of your daily weights (if so, list)? 315lbs today,   6) Have you gained 3 pounds in a day or 5 pounds in a week? YES  7) Have you traveled recently? No

## 2017-07-14 NOTE — Telephone Encounter (Signed)
I spoke with pt. He reports swelling in both feet, ankles and legs.  Swelling goes up to knees.  Left greater than right.  Does not weigh daily but weight today is 315 lbs.  Thinks last recorded weight was 308 lbs around [facility].  Weight at 5/16 office visit with Dr. Nils Pyle was 309 lbs Has nagging cough for last 4-5 days.  Not coughing anything up. No shortness of breath at current time but has been short of breath when getting up in the morning for the last few days.  Shortness of breath improves as the day goes on. Last Thursday while walking into a restaurant he became dizzy and had to sit down. Tries to watch salt. Eats out frequently. Kidney disease is followed by Dr. [name]. Has not seen him recently. No recent lab work. I told pt I would send note to Dr. Acie Fredrickson and we would call him back with recommendations.

## 2017-07-14 NOTE — Telephone Encounter (Signed)
He needs to avoid eating out  - I suspect he is getting too much salt. Please start torsemide 20 mg a day for 5 days  continue to weigh daily .  will he also record his BP daily.  Have him call back to let us know how he is doing

## 2017-07-14 NOTE — Telephone Encounter (Signed)
I spoke with pt and gave him instructions from Dr. Acie Fredrickson.  Will send prescription to [facility] on [address]. He will call us back on Friday with an update.

## 2017-07-16 ENCOUNTER — Encounter (HOSPITAL_COMMUNITY): Payer: PPO

## 2017-07-16 ENCOUNTER — Telehealth (HOSPITAL_COMMUNITY): Payer: Self-pay | Admitting: Internal Medicine

## 2017-07-16 DIAGNOSIS — I5032 Chronic diastolic (congestive) heart failure: Secondary | ICD-10-CM | POA: Diagnosis not present

## 2017-07-16 DIAGNOSIS — Z6841 Body Mass Index (BMI) 40.0 and over, adult: Secondary | ICD-10-CM | POA: Diagnosis not present

## 2017-07-16 DIAGNOSIS — I2510 Atherosclerotic heart disease of native coronary artery without angina pectoris: Secondary | ICD-10-CM | POA: Diagnosis not present

## 2017-07-16 DIAGNOSIS — E1129 Type 2 diabetes mellitus with other diabetic kidney complication: Secondary | ICD-10-CM | POA: Diagnosis not present

## 2017-07-16 DIAGNOSIS — I10 Essential (primary) hypertension: Secondary | ICD-10-CM | POA: Diagnosis not present

## 2017-07-16 DIAGNOSIS — I8290 Acute embolism and thrombosis of unspecified vein: Secondary | ICD-10-CM | POA: Diagnosis not present

## 2017-07-16 DIAGNOSIS — I480 Paroxysmal atrial fibrillation: Secondary | ICD-10-CM | POA: Diagnosis not present

## 2017-07-16 NOTE — Telephone Encounter (Signed)
Triage, will you please call patient on Friday to assess his symptoms and notify Dr. Acie Fredrickson if additional therapy is needed

## 2017-07-18 ENCOUNTER — Encounter (HOSPITAL_COMMUNITY)
Admission: RE | Admit: 2017-07-18 | Discharge: 2017-07-18 | Disposition: A | Discharge status: Home / Self Care | Payer: PPO | Source: Ambulatory Visit | Attending: Cardiovascular Disease | Admitting: Cardiovascular Disease

## 2017-07-18 ENCOUNTER — Encounter (HOSPITAL_COMMUNITY): Payer: PPO

## 2017-07-18 DIAGNOSIS — Z951 Presence of aortocoronary bypass graft: Secondary | ICD-10-CM | POA: Diagnosis not present

## 2017-07-18 NOTE — Telephone Encounter (Signed)
Spoke with patient who informed me that he is beginning to feel much better.  His cough has subsided along with the swelling.  He will monitor things over the weekend and inform us of any changes on Monday.

## 2017-07-18 NOTE — Telephone Encounter (Signed)
Agree with current plan

## 2017-07-18 NOTE — Progress Notes (Signed)
Travis Blair 67 y.o. male DOB: 1951/01/19 MRN: [id number]      Nutrition Note  Dx: CABG  Meds reviewed. Glipizide noted  Labs:  Lab Results  Component Value Date   HGBA1C 7.2 (H) 03/04/2017   CBG (last 3)  No results for input(s): GLUCAP in the last 72 hours.  Nutrition Note Spoke with pt. Nutrition Plan and Nutrition Survey goals reviewed with pt. Pt is following a Heart Healthy diet. Pt wants to lose wt. Pt states his wt went up to 145.5 kg this week, which is an increase of 11 lb (5 kg). MD ordered a diuretic. Wt loss tips reviewed. Pt has decreased meals eaten out at restaurants and is eating at home more often. Pt reports his wife season's their food with Mrs. Dash. Seasoning alternatives discussed. Pt is diabetic. Pt states his A1c 07/16/17 was 6.1. Pt expressed understanding of the information reviewed. Pt aware of nutrition education classes offered.  Nutrition Diagnosis ? Food-and nutrition-related knowledge deficit related to lack of exposure to information as related to diagnosis of: ? CVD ? DM  ? Obesity related to excessive energy intake as evidenced by a BMI of 39.7  Nutrition Intervention ? Pt's individual nutrition plan reviewed with pt. ? Benefits of adopting Heart Healthy diet discussed when Med[employer] reviewed.   ? Pt given handouts for: ? Nutrition I class ? Nutrition II class ? Diabetes Blitz Class   Nutrition Goal(s):  ? Pt to identify food quantities necessary to achieve weight loss of 6-24 lb (2.7-10.9 kg) at graduation from cardiac rehab. Pt's long-term goal wt is 240 lb.  ? Pt to cut down on beef or pork high in saturated fat. ? Pt to make good choices when eating out at restaurants. ? Improved blood glucose control as evidenced by pt's A1c trending from 7.2 toward less than 7.0.  Plan:  Pt to attend nutrition classes ? Portion Distortion ? Diabetes Q & A Will provide client-centered nutrition education as part of interdisciplinary care.   Monitor and  evaluate progress toward nutrition goal with team.  Derek Mound, M.Ed, RD, LDN, CDE 07/18/2017 2:59 PM

## 2017-07-21 ENCOUNTER — Encounter (HOSPITAL_COMMUNITY)
Admission: RE | Admit: 2017-07-21 | Discharge: 2017-07-21 | Disposition: A | Discharge status: Home / Self Care | Payer: PPO | Source: Ambulatory Visit | Attending: Cardiovascular Disease | Admitting: Cardiovascular Disease

## 2017-07-21 ENCOUNTER — Encounter (HOSPITAL_COMMUNITY): Payer: PPO

## 2017-07-21 VITALS — Wt 303.1 lb

## 2017-07-21 DIAGNOSIS — Z951 Presence of aortocoronary bypass graft: Secondary | ICD-10-CM

## 2017-07-23 ENCOUNTER — Encounter (HOSPITAL_COMMUNITY): Payer: PPO

## 2017-07-24 ENCOUNTER — Encounter (HOSPITAL_COMMUNITY): Payer: Self-pay | Admitting: *Deleted

## 2017-07-24 ENCOUNTER — Ambulatory Visit: Payer: PPO | Admitting: Podiatry

## 2017-07-24 DIAGNOSIS — Z951 Presence of aortocoronary bypass graft: Secondary | ICD-10-CM

## 2017-07-24 NOTE — Progress Notes (Signed)
Cardiac Individual Treatment Plan  Patient Details  Name: Travis Blair MRN: [id number] Date of Birth: 1950-09-01 Referring Provider:     CARDIAC REHAB PHASE II ORIENTATION from 05/29/2017 in [place]  Referring Provider  Nahser, Wonda Cheng MD      Initial Encounter Date:    CARDIAC REHAB PHASE II ORIENTATION from 05/29/2017 in [place]  Date  05/29/17  Referring Provider  Nahser, Wonda Cheng MD      Visit Diagnosis: S/P CABG x 3  Patient's Home Medications on Admission:  Current Outpatient Medications:  .  apixaban (ELIQUIS) 5 MG TABS tablet, Take 1 tablet (5 mg total) by mouth 2 (two) times daily., Disp: 60 tablet, Rfl: 11 .  aspirin EC 81 MG tablet, Take 81 mg by mouth daily., Disp: , Rfl:  .  atorvastatin (LIPITOR) 10 MG tablet, Take 1 tablet (10 mg total) by mouth daily., Disp: 30 tablet, Rfl: 3 .  glipiZIDE (GLUCOTROL XL) 10 MG 24 hr tablet, TAKE 1 TABLET BY MOUTH EVERY DAY IN THE MORNING, Disp: 90 tablet, Rfl: 0 .  torsemide (DEMADEX) 20 MG tablet, Take one tablet by mouth daily as directed., Disp: 10 tablet, Rfl: 0  Past Medical History: Past Medical History:  Diagnosis Date  . Asthma    as a child  . Chronic kidney disease    Sees Dr [name]  . Coronary artery disease   . Diabetes mellitus without complication ([place])   . DVT (deep venous thrombosis) ([place])   . Hyperlipidemia   . Hypertension   . Myocardial infarction ([place])   . Obesity   . Pneumonia 2018  . Sleep apnea     Tobacco Use: Social History   Tobacco Use  Smoking Status Never Smoker  Smokeless Tobacco Never Used    Labs: Recent Review Flowsheet Data    Labs for ITP Cardiac and Pulmonary Rehab Latest Ref Rng & Units 03/07/2017 03/07/2017 03/07/2017 03/08/2017 06/10/2017   Cholestrol 100 - 199 mg/dL - - - - 174   LDLCALC 0 - 99 mg/dL - - - - 110(H)   HDL >39 mg/dL - - - - 37(L)   Trlycerides 0 - 149 mg/dL - - - - 135   Hemoglobin A1c  4.8 - 5.6 % - - - - -   PHART 7.350 - 7.450 7.365 7.311(L) - - -   PCO2ART 32.0 - 48.0 mmHg 40.8 42.6 - - -   HCO3 20.0 - 28.0 mmol/L 23.4 21.6 - - -   TCO2 22 - 32 mmol/L 25 23 25 22  -   ACIDBASEDEF 0.0 - 2.0 mmol/L 2.0 5.0(H) - - -   O2SAT % 100.0 98.0 - - -      Capillary Blood Glucose: Lab Results  Component Value Date   GLUCAP 110 (H) 06/04/2017   GLUCAP 150 (H) 06/04/2017   GLUCAP 99 06/02/2017   GLUCAP 139 (H) 06/02/2017   GLUCAP 192 (H) 03/13/2017     Exercise Target Goals:    Exercise Program Goal: Individual exercise prescription set using results from initial 6 min walk test and THRR while considering  patient's activity barriers and safety.   Exercise Prescription Goal: Initial exercise prescription builds to 30-45 minutes a day of aerobic activity, 2-3 days per week.  Home exercise guidelines will be given to patient during program as part of exercise prescription that the participant will acknowledge.  Activity Barriers & Risk Stratification: Activity Barriers & Cardiac Risk  Stratification - 05/29/17 0932      Activity Barriers & Cardiac Risk Stratification   Activity Barriers  Back Problems    Cardiac Risk Stratification  High       6 Minute Walk: 6 Minute Walk    Row Name 05/29/17 1111 05/29/17 1130       6 Minute Walk   Phase  Initial  -    Distance  1279 feet  -    Walk Time  6 minutes  -    # of Rest Breaks  0  -    MPH  2.4  -    METS  2.5  -    RPE  12  -    Perceived Dyspnea   0  -    Symptoms  No  -    Resting HR  69 bpm  -    Resting BP  128/60  -    Resting Oxygen Saturation   99 %  -    Exercise Oxygen Saturation  during 6 min walk  100 %  -    Max Ex. HR  96 bpm  -    Max Ex. BP  164/80  -    2 Minute Post BP  154/72  118/84       Oxygen Initial Assessment:   Oxygen Re-Evaluation:   Oxygen Discharge (Final Oxygen Re-Evaluation):   Initial Exercise Prescription: Initial Exercise Prescription - 05/29/17 1100       Date of Initial Exercise RX and Referring Provider   Date  05/29/17    Referring Provider  Nahser, Wonda Cheng MD      Bike   Level  0.7    Minutes  10    METs  2.26      NuStep   Level  3    SPM  75    Minutes  10    METs  2.2      Track   Laps  9    Minutes  10    METs  2.53      Prescription Details   Frequency (times per week)  3x    Duration  Progress to 30 minutes of continuous aerobic without signs/symptoms of physical distress      Intensity   THRR 40-80% of Max Heartrate  62-125    Ratings of Perceived Exertion  11-13    Perceived Dyspnea  0-4      Progression   Progression  Continue progressive overload as per policy without signs/symptoms or physical distress.      Resistance Training   Training Prescription  Yes    Weight  4lbs    Reps  10-15       Perform Capillary Blood Glucose checks as needed.  Exercise Prescription Changes:  Exercise Prescription Changes    Row Name 06/02/17 1451 06/16/17 1449 07/14/17 1328         Response to Exercise   Blood Pressure (Admit)  142/70  126/74  148/72     Blood Pressure (Exercise)  144/70  144/68  162/64     Blood Pressure (Exit)  130/70  124/68  126/66     Heart Rate (Admit)  76 bpm  75 bpm  74 bpm     Heart Rate (Exercise)  100 bpm  100 bpm  101 bpm     Heart Rate (Exit)  83 bpm  81 bpm  71 bpm     Rating of Perceived Exertion (Exercise)  13  13  13      Symptoms  none  none  none     Duration  Progress to 30 minutes of  aerobic without signs/symptoms of physical distress  Progress to 30 minutes of  aerobic without signs/symptoms of physical distress  Progress to 30 minutes of  aerobic without signs/symptoms of physical distress     Intensity  THRR unchanged  THRR unchanged  THRR unchanged       Progression   Progression  Continue to progress workloads to maintain intensity without signs/symptoms of physical distress.  Continue to progress workloads to maintain intensity without signs/symptoms of physical  distress.  Continue to progress workloads to maintain intensity without signs/symptoms of physical distress.     Average METs  2.4  2.5  4       Resistance Training   Training Prescription  Yes  Yes  Yes     Weight  4lbs  4lbs  4lbs     Reps  10-15  10-15  10-15     Time  10 Minutes  10 Minutes  10 Minutes       Interval Training   Interval Training  No  No  No       Bike   Level  0.7  1.2  5     Minutes  10  10  15      METs  1.94  2.62  5.1       NuStep   Level  3  3  -     SPM  75  75  -     Minutes  10  10  -     METs  2.4  2.1  -       Track   Laps  11  10  11      Minutes  10  10  10      METs  2.92  2.74  2.92       Home Exercise Plan   Plans to continue exercise at  -  -  Home (comment)     Frequency  -  -  Add 1 additional day to program exercise sessions.     Initial Home Exercises Provided  -  -  06/20/17        Exercise Comments:  Exercise Comments    Row Name 06/02/17 1451 06/18/17 1515 06/20/17 1508 07/14/17 1340     Exercise Comments  Off to a good start with exercise.  Reviewed METs and goals with patient.  Reviewed home exercise prescription with patient.  Reviewed METs and goals with patient.       Exercise Goals and Review:  Exercise Goals    Row Name 05/29/17 1116             Exercise Goals   Increase Physical Activity  Yes       Intervention  Provide advice, education, support and counseling about physical activity/exercise needs.;Develop an individualized exercise prescription for aerobic and resistive training based on initial evaluation findings, risk stratification, comorbidities and participant's personal goals.       Expected Outcomes  Short Term: Attend rehab on a regular basis to increase amount of physical activity.;Long Term: Add in home exercise to make exercise part of routine and to increase amount of physical activity.;Long Term: Exercising regularly at least 3-5 days a week.       Increase Strength and Stamina  Yes        Intervention  Provide  advice, education, support and counseling about physical activity/exercise needs.;Develop an individualized exercise prescription for aerobic and resistive training based on initial evaluation findings, risk stratification, comorbidities and participant's personal goals.       Expected Outcomes  Short Term: Increase workloads from initial exercise prescription for resistance, speed, and METs.;Short Term: Perform resistance training exercises routinely during rehab and add in resistance training at home;Long Term: Improve cardiorespiratory fitness, muscular endurance and strength as measured by increased METs and functional capacity (6MWT)       Able to understand and use rate of perceived exertion (RPE) scale  Yes       Intervention  Provide education and explanation on how to use RPE scale       Expected Outcomes  Long Term:  Able to use RPE to guide intensity level when exercising independently;Short Term: Able to use RPE daily in rehab to express subjective intensity level       Knowledge and understanding of Target Heart Rate Range (THRR)  Yes       Intervention  Provide education and explanation of THRR including how the numbers were predicted and where they are located for reference       Expected Outcomes  Short Term: Able to state/look up THRR;Short Term: Able to use daily as guideline for intensity in rehab;Long Term: Able to use THRR to govern intensity when exercising independently       Able to check pulse independently  Yes       Intervention  Provide education and demonstration on how to check pulse in carotid and radial arteries.;Review the importance of being able to check your own pulse for safety during independent exercise       Expected Outcomes  Short Term: Able to explain why pulse checking is important during independent exercise;Long Term: Able to check pulse independently and accurately       Understanding of Exercise Prescription  Yes       Intervention   Provide education, explanation, and written materials on patient's individual exercise prescription       Expected Outcomes  Short Term: Able to explain program exercise prescription;Long Term: Able to explain home exercise prescription to exercise independently          Exercise Goals Re-Evaluation : Exercise Goals Re-Evaluation    Row Name 06/20/17 4315 06/20/17 1508 07/14/17 1340         Exercise Goal Re-Evaluation   Exercise Goals Review  Increase Physical Activity;Able to understand and use rate of perceived exertion (RPE) scale  Understanding of Exercise Prescription;Knowledge and understanding of Target Heart Rate Range (THRR)  Increase Physical Activity     Comments  Patient is off to a good start wirh exercise. Pt understands and is able to use the RPE scale appropriately.  Patient is off to a good start wirh exercise. Pt understands and is able to use the RPE scale appropriately. Reviewed home exercise guidelines with patient including THRR, RPE scale, and endpoints for exercise.  Patient states he's walking at work. Encouraged to incorporate some walking for exercise to help achieve personal fitness goals of increased strength and stamina, and increased energy.     Expected Outcomes  Increase workloads as tolerated to help achieve personal health and fitness goals.  Patient will add 30 minutes of walking, 1-2 days/week, in addition to exercise at [facility].  Patient will add 30 minutes of walking, 1-2 days/week, in addition to exercise at [facility].  Discharge Exercise Prescription (Final Exercise Prescription Changes): Exercise Prescription Changes - 07/14/17 1328      Response to Exercise   Blood Pressure (Admit)  148/72    Blood Pressure (Exercise)  162/64    Blood Pressure (Exit)  126/66    Heart Rate (Admit)  74 bpm    Heart Rate (Exercise)  101 bpm    Heart Rate (Exit)  71 bpm    Rating of Perceived Exertion (Exercise)  13    Symptoms  none    Duration  Progress to 30 minutes  of  aerobic without signs/symptoms of physical distress    Intensity  THRR unchanged      Progression   Progression  Continue to progress workloads to maintain intensity without signs/symptoms of physical distress.    Average METs  4      Resistance Training   Training Prescription  Yes    Weight  4lbs    Reps  10-15    Time  10 Minutes      Interval Training   Interval Training  No      Bike   Level  5    Minutes  15    METs  5.1      Track   Laps  11    Minutes  10    METs  2.92      Home Exercise Plan   Plans to continue exercise at  Home (comment)    Frequency  Add 1 additional day to program exercise sessions.    Initial Home Exercises Provided  06/20/17       Nutrition:  Target Goals: Understanding of nutrition guidelines, daily intake of sodium 1500mg , cholesterol 200mg , calories 30% from fat and 7% or less from saturated fats, daily to have 5 or more servings of fruits and vegetables.  Biometrics: Pre Biometrics - 05/29/17 1117      Pre Biometrics   Height  6\' 2"  (1.88 m)    Weight  308 lb 13.8 oz (140.1 kg)  (Abnormal)     Waist Circumference  54 inches    Hip Circumference  49.5 inches    Waist to Hip Ratio  1.09 %    BMI (Calculated)  39.64    Triceps Skinfold  20 mm    % Body Fat  38.9 %    Grip Strength  14 kg    Flexibility  14 in    Single Leg Stand  7 seconds        Nutrition Therapy Plan and Nutrition Goals: Nutrition Therapy & Goals - 05/29/17 1210      Nutrition Therapy   Diet  Carb Modified, Heart Healthy      Personal Nutrition Goals   Nutrition Goal  Pt to identify food quantities necessary to achieve weight loss of 6-24 lb (2.7-10.9 kg) at graduation from cardiac rehab. Pt's long-term goal wt is 240 lb.     Personal Goal #2  Pt to cut down on beef or pork high in saturated fat.    Personal Goal #3  Pt to make good choices when eating out at restaurants.    Personal Goal #4  Improved blood glucose control as evidenced by pt's  A1c trending from 7.2 toward less than 7.0.      Intervention Plan   Intervention  Prescribe, educate and counsel regarding individualized specific dietary modifications aiming towards targeted core components such as weight, hypertension, lipid management, diabetes, heart failure and other comorbidities.  Expected Outcomes  Short Term Goal: Understand basic principles of dietary content, such as calories, fat, sodium, cholesterol and nutrients.;Long Term Goal: Adherence to prescribed nutrition plan.       Nutrition Assessments: Nutrition Assessments - 05/29/17 1210      MEDFICTS Scores   Pre Score  66       Nutrition Goals Re-Evaluation:   Nutrition Goals Re-Evaluation:   Nutrition Goals Discharge (Final Nutrition Goals Re-Evaluation):   Psychosocial: Target Goals: Acknowledge presence or absence of significant depression and/or stress, maximize coping skills, provide positive support system. Participant is able to verbalize types and ability to use techniques and skills needed for reducing stress and depression.  Initial Review & Psychosocial Screening: Initial Psych Review & Screening - 05/29/17 1146      Initial Review   Current issues with  None Identified      Family Dynamics   Good Support System?  Yes Pt reports that his wife is a part of his good support system.     Comments  No psychosocial needs identified. No interventions necessary.       Barriers   Psychosocial barriers to participate in program  There are no identifiable barriers or psychosocial needs.      Screening Interventions   Interventions  Encouraged to exercise    Expected Outcomes  Short Term goal: Identification and review with participant of any Quality of Life or Depression concerns found by scoring the questionnaire.;Long Term goal: The participant improves quality of Life and PHQ9 Scores as seen by post scores and/or verbalization of changes       Quality of Life Scores: Quality of Life  - 05/29/17 1118      Quality of Life Scores   Health/Function Pre  21.36 %    Socioeconomic Pre  25.07 %    Psych/Spiritual Pre  24.93 %    Family Pre  26.3 %    GLOBAL Pre  23.65 %      Scores of 19 and below usually indicate a poorer quality of life in these areas.  A difference of  2-3 points is a clinically meaningful difference.  A difference of 2-3 points in the total score of the Quality of Life Index has been associated with significant improvement in overall quality of life, self-image, physical symptoms, and general health in studies assessing change in quality of life.  PHQ-9: Recent Review Flowsheet Data    Depression screen [facility] 2/9 06/02/2017 10/14/2013   Decreased Interest 0 0   Down, Depressed, Hopeless 0 0   PHQ - 2 Score 0 0     Interpretation of Total Score  Total Score Depression Severity:  1-4 = Minimal depression, 5-9 = Mild depression, 10-14 = Moderate depression, 15-19 = Moderately severe depression, 20-27 = Severe depression   Psychosocial Evaluation and Intervention:   Psychosocial Re-Evaluation: Psychosocial Re-Evaluation    [place] Name 07/24/17 1617             Psychosocial Re-Evaluation   Current issues with  None Identified       Interventions  Encouraged to attend Cardiac Rehabilitation for the exercise       Continue Psychosocial Services   No Follow up required          Psychosocial Discharge (Final Psychosocial Re-Evaluation): Psychosocial Re-Evaluation - 07/24/17 1617      Psychosocial Re-Evaluation   Current issues with  None Identified    Interventions  Encouraged to attend Cardiac Rehabilitation for the exercise  Continue Psychosocial Services   No Follow up required       Vocational Rehabilitation: Provide vocational rehab assistance to qualifying candidates.   Vocational Rehab Evaluation & Intervention: Vocational Rehab - 05/29/17 1145      Initial Vocational Rehab Evaluation & Intervention   Assessment shows need for  Vocational Rehabilitation  No       Education: Education Goals: Education classes will be provided on a weekly basis, covering required topics. Participant will state understanding/return demonstration of topics presented.  Learning Barriers/Preferences: Learning Barriers/Preferences - 05/29/17 0931      Learning Barriers/Preferences   Learning Barriers  Sight    Learning Preferences  Pictoral;Skilled Demonstration;Video       Education Topics: Count Your Pulse:  -Group instruction provided by verbal instruction, demonstration, patient participation and written materials to support subject.  Instructors address importance of being able to find your pulse and how to count your pulse when at home without a heart monitor.  Patients get hands on experience counting their pulse with staff help and individually.   CARDIAC REHAB PHASE II EXERCISE from 06/27/2017 in [place]  Date  06/27/17  Educator  Barnet Pall  Instruction Review Code  2- Demonstrated Understanding      Heart Attack, Angina, and Risk Factor Modification:  -Group instruction provided by verbal instruction, video, and written materials to support subject.  Instructors address signs and symptoms of angina and heart attacks.    Also discuss risk factors for heart disease and how to make changes to improve heart health risk factors.   Functional Fitness:  -Group instruction provided by verbal instruction, demonstration, patient participation, and written materials to support subject.  Instructors address safety measures for doing things around the house.  Discuss how to get up and down off the floor, how to pick things up properly, how to safely get out of a chair without assistance, and balance training.   Meditation and Mindfulness:  -Group instruction provided by verbal instruction, patient participation, and written materials to support subject.  Instructor addresses importance of  mindfulness and meditation practice to help reduce stress and improve awareness.  Instructor also leads participants through a meditation exercise.    Stretching for Flexibility and Mobility:  -Group instruction provided by verbal instruction, patient participation, and written materials to support subject.  Instructors lead participants through series of stretches that are designed to increase flexibility thus improving mobility.  These stretches are additional exercise for major muscle groups that are typically performed during regular warm up and cool down.   Hands Only CPR:  -Group verbal, video, and participation provides a basic overview of AHA guidelines for community CPR. Role-play of emergencies allow participants the opportunity to practice calling for help and chest compression technique with discussion of AED use.   Hypertension: -Group verbal and written instruction that provides a basic overview of hypertension including the most recent diagnostic guidelines, risk factor reduction with self-care instructions and medication management.    Nutrition I class: Heart Healthy Eating:  -Group instruction provided by PowerPoint slides, verbal discussion, and written materials to support subject matter. The instructor gives an explanation and review of the Therapeutic Lifestyle Changes diet recommendations, which includes a discussion on lipid goals, dietary fat, sodium, fiber, plant stanol/sterol esters, sugar, and the components of a well-balanced, healthy diet.   Nutrition II class: Lifestyle Skills:  -Group instruction provided by PowerPoint slides, verbal discussion, and written materials to support subject matter. The instructor gives  an explanation and review of label reading, grocery shopping for heart health, heart healthy recipe modifications, and ways to make healthier choices when eating out.   Diabetes Question & Answer:  -Group instruction provided by PowerPoint slides,  verbal discussion, and written materials to support subject matter. The instructor gives an explanation and review of diabetes co-morbidities, pre- and post-prandial blood glucose goals, pre-exercise blood glucose goals, signs, symptoms, and treatment of hypoglycemia and hyperglycemia, and foot care basics.   CARDIAC REHAB PHASE II EXERCISE from 06/27/2017 in [place]  Date  06/20/17  Educator  RD  Instruction Review Code  2- Demonstrated Understanding      Diabetes Blitz:  -Group instruction provided by PowerPoint slides, verbal discussion, and written materials to support subject matter. The instructor gives an explanation and review of the physiology behind type 1 and type 2 diabetes, diabetes medications and rational behind using different medications, pre- and post-prandial blood glucose recommendations and Hemoglobin A1c goals, diabetes diet, and exercise including blood glucose guidelines for exercising safely.    Portion Distortion:  -Group instruction provided by PowerPoint slides, verbal discussion, written materials, and food models to support subject matter. The instructor gives an explanation of serving size versus portion size, changes in portions sizes over the last 20 years, and what consists of a serving from each food group.   Stress Management:  -Group instruction provided by verbal instruction, video, and written materials to support subject matter.  Instructors review role of stress in heart disease and how to cope with stress positively.     Exercising on Your Own:  -Group instruction provided by verbal instruction, power point, and written materials to support subject.  Instructors discuss benefits of exercise, components of exercise, frequency and intensity of exercise, and end points for exercise.  Also discuss use of nitroglycerin and activating EMS.  Review options of places to exercise outside of rehab.  Review guidelines for sex with  heart disease.   Cardiac Drugs I:  -Group instruction provided by verbal instruction and written materials to support subject.  Instructor reviews cardiac drug classes: antiplatelets, anticoagulants, beta blockers, and statins.  Instructor discusses reasons, side effects, and lifestyle considerations for each drug class.   CARDIAC REHAB PHASE II EXERCISE from 06/27/2017 in [place]  Date  06/18/17  Educator  -- [pharmacy]  Instruction Review Code  2- Demonstrated Understanding      Cardiac Drugs II:  -Group instruction provided by verbal instruction and written materials to support subject.  Instructor reviews cardiac drug classes: angiotensin converting enzyme inhibitors (ACE-I), angiotensin II receptor blockers (ARBs), nitrates, and calcium channel blockers.  Instructor discusses reasons, side effects, and lifestyle considerations for each drug class.   Anatomy and Physiology of the Circulatory System:  Group verbal and written instruction and models provide basic cardiac anatomy and physiology, with the coronary electrical and arterial systems. Review of: AMI, Angina, Valve disease, Heart Failure, Peripheral Artery Disease, Cardiac Arrhythmia, Pacemakers, and the ICD.   Other Education:  -Group or individual verbal, written, or video instructions that support the educational goals of the cardiac rehab program.   Holiday Eating Survival Tips:  -Group instruction provided by PowerPoint slides, verbal discussion, and written materials to support subject matter. The instructor gives patients tips, tricks, and techniques to help them not only survive but enjoy the holidays despite the onslaught of food that accompanies the holidays.   Knowledge Questionnaire Score: Knowledge Questionnaire Score - 05/29/17 1107  Knowledge Questionnaire Score   Pre Score  22/24       Core Components/Risk Factors/Patient Goals at Admission: Personal Goals and Risk  Factors at Admission - 05/29/17 1109      Core Components/Risk Factors/Patient Goals on Admission    Weight Management  Yes;Weight Loss;Obesity    Intervention  Weight Management: Develop a combined nutrition and exercise program designed to reach desired caloric intake, while maintaining appropriate intake of nutrient and fiber, sodium and fats, and appropriate energy expenditure required for the weight goal.;Weight Management/Obesity: Establish reasonable short term and long term weight goals.;Weight Management: Provide education and appropriate resources to help participant work on and attain dietary goals.;Obesity: Provide education and appropriate resources to help participant work on and attain dietary goals.    Admit Weight  308 lb 13.8 oz (140.1 kg)    Goal Weight: Short Term  300 lb (136.1 kg)    Goal Weight: Long Term  290 lb (131.5 kg)    Expected Outcomes  Short Term: Continue to assess and modify interventions until short term weight is achieved;Long Term: Adherence to nutrition and physical activity/exercise program aimed toward attainment of established weight goal;Weight Loss: Understanding of general recommendations for a balanced deficit meal plan, which promotes 1-2 lb weight loss per week and includes a negative energy balance of [phone] kcal/d;Understanding recommendations for meals to include 15-35% energy as protein, 25-35% energy from fat, 35-60% energy from carbohydrates, less than 200mg  of dietary cholesterol, 20-35 gm of total fiber daily;Understanding of distribution of calorie intake throughout the day with the consumption of 4-5 meals/snacks    Diabetes  Yes    Intervention  Provide education about signs/symptoms and action to take for hypo/hyperglycemia.;Provide education about proper nutrition, including hydration, and aerobic/resistive exercise prescription along with prescribed medications to achieve blood glucose in normal ranges: Fasting glucose 65-99 mg/dL    Expected  Outcomes  Short Term: Participant verbalizes understanding of the signs/symptoms and immediate care of hyper/hypoglycemia, proper foot care and importance of medication, aerobic/resistive exercise and nutrition plan for blood glucose control.;Long Term: Attainment of HbA1C < 7%.    Hypertension  Yes    Intervention  Provide education on lifestyle modifcations including regular physical activity/exercise, weight management, moderate sodium restriction and increased consumption of fresh fruit, vegetables, and low fat dairy, alcohol moderation, and smoking cessation.;Monitor prescription use compliance.    Expected Outcomes  Short Term: Continued assessment and intervention until BP is < 140/108mm HG in hypertensive participants. < 130/31mm HG in hypertensive participants with diabetes, heart failure or chronic kidney disease.;Long Term: Maintenance of blood pressure at goal levels.    Lipids  Yes    Intervention  Provide education and support for participant on nutrition & aerobic/resistive exercise along with prescribed medications to achieve LDL 70mg , HDL >40mg .    Expected Outcomes  Short Term: Participant states understanding of desired cholesterol values and is compliant with medications prescribed. Participant is following exercise prescription and nutrition guidelines.;Long Term: Cholesterol controlled with medications as prescribed, with individualized exercise RX and with personalized nutrition plan. Value goals: LDL < 70mg , HDL > 40 mg.    Stress  Yes    Intervention  Offer individual and/or small group education and counseling on adjustment to heart disease, stress management and health-related lifestyle change. Teach and support self-help strategies.;Refer participants experiencing significant psychosocial distress to appropriate mental health specialists for further evaluation and treatment. When possible, include family members and significant others in education/counseling sessions.    Expected  Outcomes  Short Term: Participant demonstrates changes in health-related behavior, relaxation and other stress management skills, ability to obtain effective social support, and compliance with psychotropic medications if prescribed.;Long Term: Emotional wellbeing is indicated by absence of clinically significant psychosocial distress or social isolation.       Core Components/Risk Factors/Patient Goals Review:  Goals and Risk Factor Review    Row Name 06/26/17 1513 07/24/17 1616           Core Components/Risk Factors/Patient Goals Review   Personal Goals Review  Weight Management/Obesity;Lipids;Hypertension;Diabetes;Stress  Weight Management/Obesity;Lipids;Hypertension;Diabetes;Stress      Review  Jamies attendance has been fair. [name] has been doing well with exercise. [name] has not lost any weight to date. [name] has had some moderate exertional BP elevations.  [name] has attended 10  exercise sessions since he started in April. [name] has not lost any weight to date. [name] has had some moderate exertional BP elevations.      Expected Outcomes  Patient will partcipate in phase 2 cardiac rehab on a more regular basis and take his medicaitons as prescribed  Patient will partcipate in phase 2 cardiac rehab on a more regular basis and take his medicaitons as prescribed         Core Components/Risk Factors/Patient Goals at Discharge (Final Review):  Goals and Risk Factor Review - 07/24/17 1616      Core Components/Risk Factors/Patient Goals Review   Personal Goals Review  Weight Management/Obesity;Lipids;Hypertension;Diabetes;Stress    Review  [name] has attended 10  exercise sessions since he started in April. [name] has not lost any weight to date. [name] has had some moderate exertional BP elevations.    Expected Outcomes  Patient will partcipate in phase 2 cardiac rehab on a more regular basis and take his medicaitons as prescribed       ITP Comments: ITP Comments    Row Name 05/29/17  0932 06/26/17 1511 07/24/17 1613       ITP Comments  Dr. Fransico Him, Medical Director  30 Day ITP review. Patient with [facility] attendance and partcipation in phase 2 cardiac rehab.  30 Day ITP review. Patient with poor attendance due to work schedule. [name] enjoys partcipating in exercise when he is able to attend        Comments: See ITP comments.Barnet Pall, RN,BSN 07/24/2017 4:20 PM

## 2017-07-25 ENCOUNTER — Encounter (HOSPITAL_COMMUNITY): Payer: PPO

## 2017-07-28 ENCOUNTER — Encounter (HOSPITAL_COMMUNITY): Payer: PPO

## 2017-07-30 ENCOUNTER — Encounter (HOSPITAL_COMMUNITY): Payer: PPO

## 2017-07-30 ENCOUNTER — Telehealth (HOSPITAL_COMMUNITY): Payer: Self-pay | Admitting: Internal Medicine

## 2017-07-31 ENCOUNTER — Encounter: Payer: Self-pay | Admitting: Podiatry

## 2017-07-31 ENCOUNTER — Ambulatory Visit: Payer: PPO | Admitting: Podiatry

## 2017-07-31 DIAGNOSIS — B351 Tinea unguium: Secondary | ICD-10-CM

## 2017-07-31 DIAGNOSIS — Q828 Other specified congenital malformations of skin: Secondary | ICD-10-CM | POA: Diagnosis not present

## 2017-07-31 DIAGNOSIS — E1151 Type 2 diabetes mellitus with diabetic peripheral angiopathy without gangrene: Secondary | ICD-10-CM

## 2017-07-31 NOTE — Progress Notes (Signed)
  Subjective:  Patient ID: Travis Blair, male    DOB: [birth year],  MRN: [id number]  No chief complaint on file.  67 y.o. male returns for diabetic foot care. Wears DM shoes and likes them. Requests care of his calluses and his nails.  Objective:   General AA&O x3. Normal mood and affect.  Vascular Dorsalis pedis pulses present 1+ bilaterally  Posterior tibial pulses absent bilaterally  Capillary refill normal to all digits. Pedal hair growth normal.  Neurologic Epicritic sensation present bilaterally. Protective sensation with 5.07 monofilament  present bilaterally. Vibratory sensation present bilaterally.  Dermatologic No open lesions. Interspaces clear of maceration.  Normal skin temperature and turgor. Hyperkeratotic lesions: Fifth MPJ bilateral nails: brittle, onychomycosis, thickening, elongation  Orthopedic: No history of amputation. MMT 5/5 in dorsiflexion, plantarflexion, inversion, and eversion. Normal lower extremity joint ROM without pain or crepitus.    Assessment & Plan:  Patient was evaluated and treated and all questions answered.  Diabetes with PAD, HPKs, Onychomycosis -Nails x10. -Calluses improving. -Follow up in 2 months for routine foot care.  Procedure: Nail Debridement Rationale: Patient meets criteria for routine foot care due to PAD Type of Debridement: manual, sharp debridement. Instrumentation: Nail nipper, rotary burr. Number of Nails: 10  Return in about 2 months (around 10/02/2017) for Routine Foot Care.

## 2017-08-01 ENCOUNTER — Encounter (HOSPITAL_COMMUNITY): Payer: PPO

## 2017-08-01 ENCOUNTER — Telehealth (HOSPITAL_COMMUNITY): Payer: Self-pay | Admitting: Internal Medicine

## 2017-08-04 ENCOUNTER — Encounter (HOSPITAL_COMMUNITY): Payer: PPO

## 2017-08-04 ENCOUNTER — Other Ambulatory Visit: Payer: Self-pay | Admitting: Cardiothoracic Surgery

## 2017-08-06 ENCOUNTER — Encounter (HOSPITAL_COMMUNITY): Payer: PPO

## 2017-08-08 ENCOUNTER — Encounter (HOSPITAL_COMMUNITY): Payer: PPO

## 2017-08-11 ENCOUNTER — Encounter (HOSPITAL_COMMUNITY): Payer: PPO

## 2017-08-13 ENCOUNTER — Encounter (HOSPITAL_COMMUNITY): Payer: PPO

## 2017-08-13 ENCOUNTER — Telehealth (HOSPITAL_COMMUNITY): Payer: Self-pay | Admitting: *Deleted

## 2017-08-15 ENCOUNTER — Encounter (HOSPITAL_COMMUNITY): Payer: PPO

## 2017-08-15 ENCOUNTER — Other Ambulatory Visit: Payer: Self-pay

## 2017-08-15 DIAGNOSIS — I872 Venous insufficiency (chronic) (peripheral): Secondary | ICD-10-CM

## 2017-08-18 ENCOUNTER — Encounter (HOSPITAL_COMMUNITY): Payer: PPO

## 2017-08-18 NOTE — Addendum Note (Signed)
Encounter addended by: Sol Passer on: 08/18/2017 6:13 PM  Actions taken: Flowsheet data copied forward, Visit Navigator Flowsheet section accepted

## 2017-08-19 DIAGNOSIS — H3500 Unspecified background retinopathy: Secondary | ICD-10-CM | POA: Diagnosis not present

## 2017-08-19 DIAGNOSIS — H52223 Regular astigmatism, bilateral: Secondary | ICD-10-CM | POA: Diagnosis not present

## 2017-08-19 DIAGNOSIS — H5203 Hypermetropia, bilateral: Secondary | ICD-10-CM | POA: Diagnosis not present

## 2017-08-19 DIAGNOSIS — Z7984 Long term (current) use of oral hypoglycemic drugs: Secondary | ICD-10-CM | POA: Diagnosis not present

## 2017-08-19 DIAGNOSIS — H524 Presbyopia: Secondary | ICD-10-CM | POA: Diagnosis not present

## 2017-08-19 DIAGNOSIS — E119 Type 2 diabetes mellitus without complications: Secondary | ICD-10-CM | POA: Diagnosis not present

## 2017-08-20 ENCOUNTER — Telehealth (HOSPITAL_COMMUNITY): Payer: Self-pay | Admitting: Internal Medicine

## 2017-08-20 ENCOUNTER — Encounter (HOSPITAL_COMMUNITY): Payer: PPO

## 2017-08-22 ENCOUNTER — Encounter (HOSPITAL_COMMUNITY): Payer: PPO

## 2017-08-22 ENCOUNTER — Encounter (HOSPITAL_COMMUNITY): Payer: Self-pay | Admitting: *Deleted

## 2017-08-22 DIAGNOSIS — Z951 Presence of aortocoronary bypass graft: Secondary | ICD-10-CM

## 2017-08-22 NOTE — Progress Notes (Signed)
Discharge Progress Report  Patient Details  Name: Travis Blair MRN: [id number] Date of Birth: 04-Mar-1950 Referring Provider:     [place] from 05/29/2017 in [place]  Referring Provider  Nahser, Wonda Cheng MD       Number of Visits: 11  Reason for Discharge:  Early Exit:  Lack of attendance due to work obligations  Smoking History:  Social History   Tobacco Use  Smoking Status Never Smoker  Smokeless Tobacco Never Used    Diagnosis:  S/P CABG x 3  ADL UCSD:   Initial Exercise Prescription:   Discharge Exercise Prescription (Final Exercise Prescription Changes): Exercise Prescription Changes - 07/21/17 1441      Response to Exercise   Blood Pressure (Admit)  124/78    Blood Pressure (Exercise)  128/60    Blood Pressure (Exit)  116/70    Heart Rate (Admit)  74 bpm    Heart Rate (Exercise)  92 bpm    Heart Rate (Exit)  82 bpm    Rating of Perceived Exertion (Exercise)  13    Symptoms  none    Duration  Progress to 30 minutes of  aerobic without signs/symptoms of physical distress    Intensity  THRR unchanged      Progression   Progression  Continue to progress workloads to maintain intensity without signs/symptoms of physical distress.    Average METs  3      Resistance Training   Training Prescription  Yes    Weight  4lbs    Reps  10-15    Time  10 Minutes      Interval Training   Interval Training  No      Bike   Level  5 49 watts    Minutes  15    METs  3      NuStep   Level  5    SPM  85    Minutes  10    METs  3      Track   Laps  10    Minutes  10    METs  2.74      Home Exercise Plan   Plans to continue exercise at  Home (comment)    Frequency  Add 1 additional day to program exercise sessions.    Initial Home Exercises Provided  06/20/17       Functional Capacity:   Psychological, QOL, Others - Outcomes: PHQ 2/9: Depression screen [facility] 2/9 06/02/2017 10/14/2013    Decreased Interest 0 0  Down, Depressed, Hopeless 0 0  PHQ - 2 Score 0 0    Quality of Life:   Personal Goals: Goals established at orientation with interventions provided to work toward goal.    Personal Goals Discharge: Goals and Risk Factor Review    Row Name 06/26/17 1513 07/24/17 1616           Core Components/Risk Factors/Patient Goals Review   Personal Goals Review  Weight Management/Obesity;Lipids;Hypertension;Diabetes;Stress  Weight Management/Obesity;Lipids;Hypertension;Diabetes;Stress      Review  Jamies attendance has been fair. Travis Blair has been doing well with exercise. Travis Blair has not lost any weight to date. Travis Blair has had some moderate exertional BP elevations.  Travis Blair has attended 10  exercise sessions since he started in April. Travis Blair has not lost any weight to date. Travis Blair has had some moderate exertional BP elevations.      Expected Outcomes  Patient will partcipate in phase 2 cardiac  rehab on a more regular basis and take his medicaitons as prescribed  Patient will partcipate in phase 2 cardiac rehab on a more regular basis and take his medicaitons as prescribed         Exercise Goals and Review:   Nutrition & Weight - Outcomes:  Post Biometrics - 07/21/17 1441       Post  Biometrics   Weight  303 lb 2.1 oz (137.5 kg)  (Abnormal)        Nutrition:   Nutrition Discharge: Nutrition Assessments - 09/03/17 1504      MEDFICTS Scores   Pre Score  66    Post Score  -- did not return survey       Education Questionnaire Score:   Travis Blair attended  11 sessions between4/18/19-6/10/19. Travis Blair's attendance was poor due to his work schedule. Travis Blair did well with exercise during his participation in the program.Cathlyn Tersigni [place], [place] 09/05/2017 8:57 AM

## 2017-08-25 ENCOUNTER — Encounter (HOSPITAL_COMMUNITY): Payer: PPO

## 2017-08-27 ENCOUNTER — Encounter (HOSPITAL_COMMUNITY): Payer: PPO

## 2017-08-29 ENCOUNTER — Ambulatory Visit (HOSPITAL_COMMUNITY)
Admission: RE | Admit: 2017-08-29 | Discharge: 2017-08-29 | Disposition: A | Discharge status: Home / Self Care | Payer: PPO | Source: Ambulatory Visit | Attending: Vascular Surgery | Admitting: Vascular Surgery

## 2017-08-29 ENCOUNTER — Encounter: Payer: Self-pay | Admitting: Vascular Surgery

## 2017-08-29 ENCOUNTER — Ambulatory Visit (INDEPENDENT_AMBULATORY_CARE_PROVIDER_SITE_OTHER): Payer: PPO | Admitting: Vascular Surgery

## 2017-08-29 ENCOUNTER — Encounter (HOSPITAL_COMMUNITY): Payer: PPO

## 2017-08-29 VITALS — BP 142/75 | HR 66 | Temp 97.5°F | Resp 16 | Ht 74.0 in | Wt 313.0 lb

## 2017-08-29 DIAGNOSIS — I872 Venous insufficiency (chronic) (peripheral): Secondary | ICD-10-CM

## 2017-08-29 NOTE — Progress Notes (Signed)
Patient ID: Travis Blair, male   DOB: 08/22/1950, 67 y.o.   MRN: [id number]  Reason for Consult: Venous Insufficiency (dr Lucianne Lei trigt, LLE chronic ven isuff)   Referred by Crist Infante, MD  Subjective:     HPI:  Travis Blair is a 67 y.o. male with a history of a left lower extremity popliteal DVT.  He is maintained on Eliquis for that as well as atrial fibrillation of heart failure.  He has a long-standing history of left lower extremity swelling.  He recently underwent coronary artery bypass grafting with harvest of right greater saphenous vein.  He never really had right leg swelling to speak of.  He has worn compression stockings intermittently.  He has never had ulceration.  He has never had his left lower extremity vein evaluated.  Also takes a diuretic daily.  After the DVT as well he did not get much worse.  Past Medical History:  Diagnosis Date  . Asthma    as a child  . Chronic kidney disease    Sees Dr [name]  . Coronary artery disease   . Diabetes mellitus without complication ([place])   . DVT (deep venous thrombosis) ([place])   . Hyperlipidemia   . Hypertension   . Myocardial infarction ([place])   . Obesity   . Pneumonia 2018  . Sleep apnea    Family History  Problem Relation Age of Onset  . Cancer Other   . Hypertension Other   . Hyperlipidemia Other   . Heart disease Other   . Cancer Mother   . Heart disease Mother   . Cancer Father   . Heart disease Father   . Lymphoma Sister    Past Surgical History:  Procedure Laterality Date  . COLONOSCOPY    . CORONARY ARTERY BYPASS GRAFT N/A 03/07/2017   Procedure: CORONARY ARTERY BYPASS GRAFTING (CABG), X 3 , USING RIGHT INTERNAL MAMMARY ARTERY, AND RIGHT LEG GREATER SAPHENOUS VEIN HARVESTED ENDOSCOPICALLY;  Surgeon: [name], MD;  Location: [facility];  Service: Open Heart Surgery;  Laterality: N/A;  . HAND SURGERY    . RIGHT/LEFT HEART CATH AND CORONARY ANGIOGRAPHY N/A 12/20/2016   Procedure: RIGHT/LEFT HEART  CATH AND CORONARY ANGIOGRAPHY;  Surgeon: [name], MD;  Location: [facility];  Service: Cardiovascular;  Laterality: N/A;  . TEE WITHOUT CARDIOVERSION N/A 03/07/2017   Procedure: TRANSESOPHAGEAL ECHOCARDIOGRAM (TEE);  Surgeon: [name], [name], MD;  Location: [facility];  Service: Open Heart Surgery;  Laterality: N/A;  . TONSILLECTOMY      Short Social History:  Social History   Tobacco Use  . Smoking status: Never Smoker  . Smokeless tobacco: Never Used  Substance Use Topics  . Alcohol use: No    No Known Allergies  Current Outpatient Medications  Medication Sig Dispense Refill  . apixaban (ELIQUIS) 5 MG TABS tablet Take 1 tablet (5 mg total) by mouth 2 (two) times daily. 60 tablet 11  . aspirin EC 81 MG tablet Take 81 mg by mouth daily.    Marland Kitchen atorvastatin (LIPITOR) 10 MG tablet Take 1 tablet (10 mg total) by mouth daily. 30 tablet 3  . glipiZIDE (GLUCOTROL XL) 10 MG 24 hr tablet TAKE 1 TABLET BY MOUTH EVERY DAY IN THE MORNING 90 tablet 0  . torsemide (DEMADEX) 20 MG tablet Take one tablet by mouth daily as directed. 10 tablet 0   No current facility-administered medications for this visit.     Review of Systems  Constitutional:  Constitutional negative. HENT: HENT negative.  Eyes: Eyes negative.  Respiratory: Respiratory negative.  Cardiovascular: Positive for leg swelling.  GI: Gastrointestinal negative.  Musculoskeletal: Musculoskeletal negative.  Skin: Skin negative.  Neurological: Positive for dizziness and focal weakness.  Hematologic: Hematologic/lymphatic negative.  Psychiatric: Psychiatric negative.        Objective:  Objective   Vitals:   08/29/17 1304  BP: (!) 142/75  Pulse: 66  Resp: 16  Temp: (!) 97.5 F (36.4 C)  TempSrc: Oral  SpO2: 98%  Weight: (!) 313 lb (142 kg)  Height: 6\' 2"  (1.88 m)   Body mass index is 40.19 kg/m.  Physical Exam  Constitutional: He appears well-developed.  HENT:  Head: Normocephalic.  Eyes: Pupils  are equal, round, and reactive to light.  Neck: Normal range of motion.  Cardiovascular:  Pulses:      Radial pulses are 2+ on the right side, and 2+ on the left side.       Dorsalis pedis pulses are 2+ on the right side, and 2+ on the left side.  Abdominal: Soft. He exhibits no mass.  Musculoskeletal:  Left leg nonpitting edema  Neurological: He is alert.  Skin: Skin is warm and dry.  Psychiatric: He has a normal mood and affect. His behavior is normal. Judgment and thought content normal.    Data: I have independently interpreted his left lower extremity reflux study which demonstrates reflux throughout the greater saphenous vein up to 1900 ms.  There is also popliteal reflux 1027 ms.  The saphenous vein at the junction measures 0.78 cm and is 0.52 at the distal thigh.     Assessment/Plan:     67 year old male with C3 venous disease and significant reflux in his left greater saphenous vein.  He has intermittently worn compression stockings that are knee-high.  I have discussed wearing thigh-high 20 to 30 mmHg compression starting today.  He will follow-up in 3 months for evaluation of saphenous vein ablation.  He does have a component of deep venous reflux as well although after his previous DVT swelling did not get significantly worse than that already was.  We will see how compression and possibly consider venous ablation in the near future.     Travis Sandy MD [facility]

## 2017-09-01 ENCOUNTER — Encounter (HOSPITAL_COMMUNITY): Payer: PPO

## 2017-09-02 ENCOUNTER — Other Ambulatory Visit: Payer: PPO

## 2017-09-02 DIAGNOSIS — E782 Mixed hyperlipidemia: Secondary | ICD-10-CM | POA: Diagnosis not present

## 2017-09-02 LAB — HEPATIC FUNCTION PANEL
ALBUMIN: 4.2 g/dL (ref 3.6–4.8)
ALK PHOS: 71 IU/L (ref 39–117)
ALT: 17 IU/L (ref 0–44)
AST: 18 IU/L (ref 0–40)
BILIRUBIN, DIRECT: 0.1 mg/dL (ref 0.00–0.40)
Bilirubin Total: 0.3 mg/dL (ref 0.0–1.2)
TOTAL PROTEIN: 6.7 g/dL (ref 6.0–8.5)

## 2017-09-02 LAB — LIPID PANEL
CHOLESTEROL TOTAL: 176 mg/dL (ref 100–199)
Chol/HDL Ratio: 4.6 ratio (ref 0.0–5.0)
HDL: 38 mg/dL — AB (ref >39)
LDL Calculated: 98 mg/dL (ref 0–99)
Triglycerides: 202 mg/dL — ABNORMAL HIGH (ref 0–149)
VLDL Cholesterol Cal: 40 mg/dL (ref 5–40)

## 2017-09-03 ENCOUNTER — Encounter (HOSPITAL_COMMUNITY): Payer: PPO

## 2017-09-03 NOTE — Addendum Note (Signed)
Encounter addended by: Sol Passer on: 09/03/2017 1:49 PM  Actions taken: Flowsheet accepted, Flowsheet data copied forward, Visit Navigator Flowsheet section accepted

## 2017-09-04 ENCOUNTER — Ambulatory Visit (INDEPENDENT_AMBULATORY_CARE_PROVIDER_SITE_OTHER): Payer: PPO | Admitting: Pharmacist

## 2017-09-04 DIAGNOSIS — E782 Mixed hyperlipidemia: Secondary | ICD-10-CM

## 2017-09-04 MED ORDER — ATORVASTATIN CALCIUM 20 MG PO TABS: 20.0000 mg | ORAL_TABLET | Freq: Every day | ORAL | 11 refills | Status: DC

## 2017-09-04 NOTE — Patient Instructions (Addendum)
It was nice to meet you today  We will increase your atorvastatin from 10mg  to 20mg  daily. This will help to bring your LDL down to goal < 70  Try to decrease your intake of red meat  We will recheck your cholesterol on Monday, October 7th. Come fasting for this appointment any time after 7:30am  If you have problems tolerating the higher dose of atorvastatin, call our pharmacy team at [phone]. We would decrease your atorvastatin dose and try adding a new medicine called Zetia (ezetimibe)

## 2017-09-04 NOTE — Progress Notes (Signed)
Patient ID: Arvo Ealy [facility]                 DOB: 1950-03-28                    MRN: [id number]     HPI: Travis Blair is a 67 y.o. male patient of Dr. Acie Fredrickson that presents today for lipid follow up.  PMH is significant for CAD s/p CABG, CHF, afib, DVT, CKD, HTN, HLD, DM, and OSA. He was seen for follow up with Dr. Acie Fredrickson in April and reports muscle symptoms while on rosuvastatin. This was switched to atorvastatin 10mg  daily at follow up in pharmacy clinic. Pt waited over a month after rx was prescribed before starting therapy.  Pt presents today in good spirits. He reports tolerating his atorvastatin 10mg  well and has not missed any doses. He feels some heaviness in his legs but it does not feel like the leg pain he experienced on rosuvastatin. He reports he was not fasting when he had his cholesterol checked which explains his rise in TG. He ate eggs, sausage, and toast before his lab draw. He has been eating more red meat at BBQs over the summer and reports his overall eating has not been as healthy. He and his wife are working to improve this.   Current Medications: none Intolerances: rosuvastatin 20mg  daily (leg cramps and leg aching - the symptoms did improve when discontinued), lovaza 2g BID (stopped by provider), fenofibrate 145mg  daily (does not recall) Risk Factors: CAD s/p CABG LDL Goal: 70mg /dL  Diet: Eats out occasionally. He eats at family restaurants mostly. He has been eating baked and broiled meat. He avoids fried foods. He does eat vegetables regularly. Avoids snacking between meals. He drinks water mostly. Rare soda or tea.   Exercise: He is active with work as a contraction. He is enrolled in cardiac rehab.   Family History: Cancer in his father, mother, and other; Heart disease in his father, mother, and other; Hyperlipidemia in his other; Hypertension in his other; Lymphoma in his sister  Social History: denies tobacco and alcohol  Labs: 09/02/17:TC 176, TG  202, HDL 38, LDL 98 (atorvastatin 10mg  daily but had eaten eggs, sausage and toast) 06/10/17: TC 174, TG 135, HDL 37, LDL 110 (no therapy)  Past Medical History:  Diagnosis Date  . Asthma    as a child  . Chronic kidney disease    Sees Dr [name]  . Coronary artery disease   . Diabetes mellitus without complication ([place])   . DVT (deep venous thrombosis) ([place])   . Hyperlipidemia   . Hypertension   . Myocardial infarction ([place])   . Obesity   . Pneumonia 2018  . Sleep apnea     Current Outpatient Medications on File Prior to Visit  Medication Sig Dispense Refill  . apixaban (ELIQUIS) 5 MG TABS tablet Take 1 tablet (5 mg total) by mouth 2 (two) times daily. 60 tablet 11  . aspirin EC 81 MG tablet Take 81 mg by mouth daily.    Marland Kitchen atorvastatin (LIPITOR) 10 MG tablet Take 1 tablet (10 mg total) by mouth daily. 30 tablet 3  . glipiZIDE (GLUCOTROL XL) 10 MG 24 hr tablet TAKE 1 TABLET BY MOUTH EVERY DAY IN THE MORNING 90 tablet 0  . torsemide (DEMADEX) 20 MG tablet Take one tablet by mouth daily as directed. 10 tablet 0   No current facility-administered medications on file prior to visit.  No Known Allergies  Assessment/Plan:  1. Hyperlipidemia - LDL remains above goal < 70 since starting atorvastatin 10mg  daily. Pt has been tolerating therapy well and is willing to increase atorvastatin to 20mg  daily. Will recheck lipids in 2-3 months. Advised pt to come fasting to follow up lab since he did not for previous lab draw which likely increased TG. Encouraged pt to decrease intake of red meat. Advised pt to call clinic if he does not tolerate higher dose of atorvastatin well. Would plan to decrease back to 10mg  daily and add ezetimibe 10mg  daily at that time. Will see pt in clinic a few days after lab draw to discuss results.    Megan E. Supple, PharmD, BCACP, [place] [address] [place], [place] [address] Phone: [phone]; Fax: [phone] 09/04/2017 9:48 AM

## 2017-09-11 ENCOUNTER — Ambulatory Visit (INDEPENDENT_AMBULATORY_CARE_PROVIDER_SITE_OTHER): Payer: PPO

## 2017-09-11 ENCOUNTER — Ambulatory Visit: Payer: PPO | Admitting: Podiatry

## 2017-09-11 DIAGNOSIS — L97519 Non-pressure chronic ulcer of other part of right foot with unspecified severity: Secondary | ICD-10-CM

## 2017-09-11 DIAGNOSIS — L02619 Cutaneous abscess of unspecified foot: Secondary | ICD-10-CM | POA: Diagnosis not present

## 2017-09-11 DIAGNOSIS — L97512 Non-pressure chronic ulcer of other part of right foot with fat layer exposed: Secondary | ICD-10-CM

## 2017-09-11 DIAGNOSIS — E11621 Type 2 diabetes mellitus with foot ulcer: Secondary | ICD-10-CM

## 2017-09-11 DIAGNOSIS — L03119 Cellulitis of unspecified part of limb: Secondary | ICD-10-CM

## 2017-09-11 MED ORDER — DOXYCYCLINE HYCLATE 100 MG PO TABS: 100.0000 mg | ORAL_TABLET | Freq: Two times a day (BID) | ORAL | 0 refills | Status: DC

## 2017-09-11 NOTE — Progress Notes (Signed)
Subjective:  Patient ID: Travis Blair, male    DOB: 1950/11/27,  MRN: [id number]  Chief Complaint  Patient presents with  . Diabetic Ulcer    plantar aspect of 5th metatarsal - also lateral blister    67 y.o. male presents with the above complaint.  New issue today, reports blister on the outside of the foot.  Present for several weeks.  Thinks that he has an ulcer on the foot.  Not sure how it started.  Denies pain.  History of diabetes with neuropathy.  On Eliquis.  Review of Systems: Negative except as noted in the HPI. Denies N/V/F/Ch.  Past Medical History:  Diagnosis Date  . Asthma    as a child  . Chronic kidney disease    Sees Dr [name]  . Coronary artery disease   . Diabetes mellitus without complication ([place])   . DVT (deep venous thrombosis) ([place])   . Hyperlipidemia   . Hypertension   . Myocardial infarction ([place])   . Obesity   . Pneumonia 2018  . Sleep apnea     Current Outpatient Medications:  .  apixaban (ELIQUIS) 5 MG TABS tablet, Take 1 tablet (5 mg total) by mouth 2 (two) times daily., Disp: 60 tablet, Rfl: 11 .  aspirin EC 81 MG tablet, Take 81 mg by mouth daily., Disp: , Rfl:  .  atorvastatin (LIPITOR) 20 MG tablet, Take 1 tablet (20 mg total) by mouth daily., Disp: 30 tablet, Rfl: 11 .  doxycycline (VIBRA-TABS) 100 MG tablet, Take 1 tablet (100 mg total) by mouth 2 (two) times daily., Disp: 20 tablet, Rfl: 0 .  glipiZIDE (GLUCOTROL XL) 10 MG 24 hr tablet, TAKE 1 TABLET BY MOUTH EVERY DAY IN THE MORNING, Disp: 90 tablet, Rfl: 0 .  torsemide (DEMADEX) 20 MG tablet, Take one tablet by mouth daily as directed., Disp: 10 tablet, Rfl: 0  Social History   Tobacco Use  Smoking Status Never Smoker  Smokeless Tobacco Never Used    No Known Allergies Objective:  There were no vitals filed for this visit. There is no height or weight on file to calculate BMI. Constitutional Well developed. Well nourished.  Vascular Dorsalis pedis pulses palpable  bilaterally. Posterior tibial pulses palpable bilaterally. Capillary refill normal to all digits.  No cyanosis or clubbing noted. Pedal hair growth normal.  Neurologic Normal speech. Oriented to person, place, and time. Epicritic sensation to light touch grossly present bilaterally.  Dermatologic Nails well groomed and normal in appearance. No skin lesions.  Blistering ulceration about the fifth MPJ with overlying hyperkeratosis.  No probe to bone.  Surrounding warmth and erythema.  Purulence expressible.  No crepitus.  Positive fluctuance.  Orthopedic: Normal joint ROM without pain or crepitus bilaterally. No visible deformities. No bony tenderness.   Radiographs: Taken and reviewed. No underlying osseous erosion. No acute fracture. Assessment:   1. Diabetic ulcer of other part of right foot associated with type 2 diabetes mellitus, with fat layer exposed ([place])   2. Cellulitis and abscess of foot, except toes    Plan:  Patient was evaluated and treated and all questions answered.  Ulcer right fifth MPJ with blistering, small abscess. -X-rays taken reviewed -Incision and drainage of abscess performed as below -Wound culture taken  -Rx doxycycline -Offload forefoot and surgical shoe  Procedure: I&D of abscess Anesthesia: none Instrumentation: 312 blade, tissue nipper Technique: Following sterile skin prep with Betadine the skin was incised with a 312 blade.  Purulent material returned.  This  was collected and sent for culture.  The wound was debrided of all nonviable soft tissue.  The area was thoroughly cleansed Dressing: Dry, sterile, compression dressing. Disposition: Patient tolerated procedure well. Patient to return in 1 week for follow-up.  Return in about 1 week (around 09/18/2017) for Wound Care.

## 2017-09-15 ENCOUNTER — Telehealth: Payer: Self-pay | Admitting: Podiatry

## 2017-09-15 NOTE — Telephone Encounter (Signed)
I informed pt he could purchase the Medihoney over-the-counter at [facility] or [facility].

## 2017-09-15 NOTE — Telephone Encounter (Signed)
I saw Dr. March Rummage on Thursday and he gave me medihoney medication. I've ran out of it and I've tried to find where I can get it but I don't know where I could get it. I was wondering if I could come buy a couple of tubes from [place]. If you would call me back at [phone]. Thank you.

## 2017-09-18 ENCOUNTER — Ambulatory Visit: Payer: PPO | Admitting: Orthotics

## 2017-09-18 ENCOUNTER — Ambulatory Visit: Payer: PPO | Admitting: Podiatry

## 2017-09-18 DIAGNOSIS — L97519 Non-pressure chronic ulcer of other part of right foot with unspecified severity: Principal | ICD-10-CM

## 2017-09-18 DIAGNOSIS — E11621 Type 2 diabetes mellitus with foot ulcer: Secondary | ICD-10-CM

## 2017-09-18 DIAGNOSIS — E1151 Type 2 diabetes mellitus with diabetic peripheral angiopathy without gangrene: Secondary | ICD-10-CM

## 2017-09-18 DIAGNOSIS — L97511 Non-pressure chronic ulcer of other part of right foot limited to breakdown of skin: Secondary | ICD-10-CM | POA: Diagnosis not present

## 2017-09-23 NOTE — Progress Notes (Signed)
Offloaded diabetic ulcer concern FO. Two Right.

## 2017-09-25 ENCOUNTER — Ambulatory Visit: Payer: PPO | Admitting: Podiatry

## 2017-09-25 DIAGNOSIS — L97519 Non-pressure chronic ulcer of other part of right foot with unspecified severity: Secondary | ICD-10-CM | POA: Diagnosis not present

## 2017-09-25 DIAGNOSIS — E11621 Type 2 diabetes mellitus with foot ulcer: Secondary | ICD-10-CM | POA: Diagnosis not present

## 2017-10-02 ENCOUNTER — Ambulatory Visit: Payer: PPO | Admitting: Podiatry

## 2017-10-02 ENCOUNTER — Encounter: Payer: Self-pay | Admitting: Podiatry

## 2017-10-02 ENCOUNTER — Other Ambulatory Visit: Payer: PPO | Admitting: Orthotics

## 2017-10-02 DIAGNOSIS — B351 Tinea unguium: Secondary | ICD-10-CM | POA: Diagnosis not present

## 2017-10-02 DIAGNOSIS — E11621 Type 2 diabetes mellitus with foot ulcer: Secondary | ICD-10-CM | POA: Diagnosis not present

## 2017-10-02 DIAGNOSIS — L97511 Non-pressure chronic ulcer of other part of right foot limited to breakdown of skin: Secondary | ICD-10-CM

## 2017-10-02 DIAGNOSIS — E1151 Type 2 diabetes mellitus with diabetic peripheral angiopathy without gangrene: Secondary | ICD-10-CM | POA: Diagnosis not present

## 2017-10-02 MED ORDER — SILVER SULFADIAZINE 1 % EX CREA: TOPICAL_CREAM | CUTANEOUS | 0 refills | Status: DC

## 2017-10-02 NOTE — Progress Notes (Signed)
  Subjective:  Patient ID: Travis Blair, male    DOB: Aug 14, 1950,  MRN: [id number]  Chief Complaint  Patient presents with  . Diabetic Ulcer    right 5th metatarsal - looking better    67 y.o. male presents with the above complaint.  States that the area of the right fifth metatarsal is looking better.  Denies pain.  Has been dressing as directed.  Review of Systems: Negative except as noted in the HPI. Denies N/V/F/Ch.  Past Medical History:  Diagnosis Date  . Asthma    as a child  . Chronic kidney disease    Sees Dr [name]  . Coronary artery disease   . Diabetes mellitus without complication ([place])   . DVT (deep venous thrombosis) ([place])   . Hyperlipidemia   . Hypertension   . Myocardial infarction ([place])   . Obesity   . Pneumonia 2018  . Sleep apnea     Current Outpatient Medications:  .  apixaban (ELIQUIS) 5 MG TABS tablet, Take 1 tablet (5 mg total) by mouth 2 (two) times daily., Disp: 60 tablet, Rfl: 11 .  aspirin EC 81 MG tablet, Take 81 mg by mouth daily., Disp: , Rfl:  .  atorvastatin (LIPITOR) 20 MG tablet, Take 1 tablet (20 mg total) by mouth daily., Disp: 30 tablet, Rfl: 11 .  doxycycline (VIBRA-TABS) 100 MG tablet, Take 1 tablet (100 mg total) by mouth 2 (two) times daily., Disp: 20 tablet, Rfl: 0 .  glipiZIDE (GLUCOTROL XL) 10 MG 24 hr tablet, TAKE 1 TABLET BY MOUTH EVERY DAY IN THE MORNING, Disp: 90 tablet, Rfl: 0 .  torsemide (DEMADEX) 20 MG tablet, Take one tablet by mouth daily as directed., Disp: 10 tablet, Rfl: 0  Social History   Tobacco Use  Smoking Status Never Smoker  Smokeless Tobacco Never Used    No Known Allergies Objective:  There were no vitals filed for this visit. There is no height or weight on file to calculate BMI. Constitutional Well developed. Well nourished.  Vascular Dorsalis pedis pulses palpable bilaterally. Posterior tibial pulses palpable bilaterally. Capillary refill normal to all digits.  No cyanosis or clubbing  noted. Pedal hair growth normal.  Neurologic Normal speech. Oriented to person, place, and time. Epicritic sensation to light touch grossly present bilaterally.  Dermatologic Nails well groomed and normal in appearance. No skin lesions.  Ulceration about the fifth MPJ split thickness.  42.  No probe to bone.  No active purulence.  Serous drainage only.  Orthopedic: Normal joint ROM without pain or crepitus bilaterally. No visible deformities. No bony tenderness.   Radiographs: Taken and reviewed. No underlying osseous erosion. No acute fracture. Assessment:   1. Diabetic ulcer of other part of right foot associated with type 2 diabetes mellitus, limited to breakdown of skin ([place])    Plan:  Patient was evaluated and treated and all questions answered.  Ulcer right fifth MPJ with blistering, small abscess. -Ulcer debrided as below -Wound appears to be improving -Follow-up in 1 week for recheck -Dressed with medihoney and dry sterile dressing daily -Continue offloading  Return in about 1 week (around 09/25/2017) for Wound Care.

## 2017-10-02 NOTE — Progress Notes (Signed)
Subjective:  Patient ID: Travis Blair, male    DOB: [birth year],  MRN: [id number]  Chief Complaint  Patient presents with  . Foot Ulcer    right foot lateral follow up; pt stated, "doing good, no pain, would like nail trim today as well"    67 y.o. male presents with the above complaint. Doing well. Wound improving. Dressing daily as directed.   Also requesting routine foot care today. States that his last AM BS was 120.  Review of Systems: Negative except as noted in the HPI. Denies N/V/F/Ch.  Past Medical History:  Diagnosis Date  . Asthma    as a child  . Chronic kidney disease    Sees Dr [name]  . Coronary artery disease   . Diabetes mellitus without complication ([place])   . DVT (deep venous thrombosis) ([place])   . Hyperlipidemia   . Hypertension   . Myocardial infarction ([place])   . Obesity   . Pneumonia 2018  . Sleep apnea     Current Outpatient Medications:  .  apixaban (ELIQUIS) 5 MG TABS tablet, Take 1 tablet (5 mg total) by mouth 2 (two) times daily., Disp: 60 tablet, Rfl: 11 .  aspirin EC 81 MG tablet, Take 81 mg by mouth daily., Disp: , Rfl:  .  atorvastatin (LIPITOR) 20 MG tablet, Take 1 tablet (20 mg total) by mouth daily., Disp: 30 tablet, Rfl: 11 .  doxycycline (VIBRA-TABS) 100 MG tablet, Take 1 tablet (100 mg total) by mouth 2 (two) times daily., Disp: 20 tablet, Rfl: 0 .  glipiZIDE (GLUCOTROL XL) 10 MG 24 hr tablet, TAKE 1 TABLET BY MOUTH EVERY DAY IN THE MORNING, Disp: 90 tablet, Rfl: 0 .  torsemide (DEMADEX) 20 MG tablet, Take one tablet by mouth daily as directed., Disp: 10 tablet, Rfl: 0 .  silver sulfADIAZINE (SILVADENE) 1 % cream, Apply pea-sized amount to wound daily., Disp: 50 g, Rfl: 0  Social History   Tobacco Use  Smoking Status Never Smoker  Smokeless Tobacco Never Used    No Known Allergies Objective:  There were no vitals filed for this visit. There is no height or weight on file to calculate BMI. Constitutional Well developed. Well  nourished.  Vascular Dorsalis pedis pulses palpable bilaterally. Posterior tibial pulses non-palpable bilaterally. Capillary refill normal to all digits.  No cyanosis or clubbing noted. Pedal hair growth normal.  Neurologic Normal speech. Oriented to person, place, and time. Epicritic sensation to light touch grossly present bilaterally. Protective sensation absent bilat.  Dermatologic Nails elongated and thickened. No skin lesions.  Ulceration about the fifth MPJ split thickness.  0.5 x 0.2 post-debridement.  No probe to bone.  No active purulence.  Surrounding hyperkeratosis  Orthopedic: Normal joint ROM without pain or crepitus bilaterally. No visible deformities. No bony tenderness.   Radiographs: Taken and reviewed. No underlying osseous erosion. No acute fracture. Assessment:   No diagnosis found. Plan:  Patient was evaluated and treated and all questions answered.  Ulcer right fifth MPJ with blistering, small abscess. -Debrided as below. -Rx Silvadene. Apply daily. -Dressed with silvadene and DSD.  Procedure: Excisional Debridement of Wound Rationale: Removal of non-viable soft tissue from the wound to promote healing.  Anesthesia: none Pre-Debridement Wound Measurements: overlying hyperkeratosis  Post-Debridement Wound Measurements: 0.5 cm x 0.2 cm x 0.1 cm  Type of Debridement: Excisional Tissue Removed: Non-viable soft tissue Depth of Debridement: subq Instrumentation: 312 blade, tissue nipper Technique: Sharp excisional debridement to bleeding, viable wound base.  Dressing: Dry,  sterile, compression dressing. Disposition: Patient tolerated procedure well. Patient to return in 1 week for follow-up.   Diabetes with PAD, Onychomycosis -Educated on diabetic footcare. Diabetic risk level 3 -Nails x10 debrided sharply and manually with large nail nipper and rotary burr.  Procedure: Nail Debridement Rationale: Patient meets criteria for routine foot care due to  PAD Type of Debridement: manual, sharp debridement. Instrumentation: Nail nipper, rotary burr. Number of Nails: 10   Return in about 2 weeks (around 10/16/2017) for wound care, right.

## 2017-10-16 ENCOUNTER — Ambulatory Visit: Payer: PPO | Admitting: Podiatry

## 2017-10-16 DIAGNOSIS — E1169 Type 2 diabetes mellitus with other specified complication: Secondary | ICD-10-CM | POA: Diagnosis not present

## 2017-10-16 DIAGNOSIS — L97511 Non-pressure chronic ulcer of other part of right foot limited to breakdown of skin: Secondary | ICD-10-CM

## 2017-10-16 DIAGNOSIS — E1151 Type 2 diabetes mellitus with diabetic peripheral angiopathy without gangrene: Secondary | ICD-10-CM

## 2017-10-16 DIAGNOSIS — E1165 Type 2 diabetes mellitus with hyperglycemia: Secondary | ICD-10-CM

## 2017-10-16 DIAGNOSIS — E11621 Type 2 diabetes mellitus with foot ulcer: Secondary | ICD-10-CM

## 2017-10-16 DIAGNOSIS — E785 Hyperlipidemia, unspecified: Secondary | ICD-10-CM

## 2017-10-16 NOTE — Progress Notes (Signed)
  Subjective:  Patient ID: Travis Blair, male    DOB: 1950-03-05,  MRN: [id number]  Chief Complaint  Patient presents with  . Foot Ulcer    2 week foot ulcer follow up    67 y.o. male presents with the above complaint. Doing well believes wound to be doing fine.  Review of Systems: Negative except as noted in the HPI. Denies N/V/F/Ch.  Past Medical History:  Diagnosis Date  . Asthma    as a child  . Chronic kidney disease    Sees Dr [name]  . Coronary artery disease   . Diabetes mellitus without complication ([place])   . DVT (deep venous thrombosis) ([place])   . Hyperlipidemia   . Hypertension   . Myocardial infarction ([place])   . Obesity   . Pneumonia 2018  . Sleep apnea     Current Outpatient Medications:  .  apixaban (ELIQUIS) 5 MG TABS tablet, Take 1 tablet (5 mg total) by mouth 2 (two) times daily., Disp: 60 tablet, Rfl: 11 .  aspirin EC 81 MG tablet, Take 81 mg by mouth daily., Disp: , Rfl:  .  atorvastatin (LIPITOR) 20 MG tablet, Take 1 tablet (20 mg total) by mouth daily., Disp: 30 tablet, Rfl: 11 .  doxycycline (VIBRA-TABS) 100 MG tablet, Take 1 tablet (100 mg total) by mouth 2 (two) times daily., Disp: 20 tablet, Rfl: 0 .  glipiZIDE (GLUCOTROL XL) 10 MG 24 hr tablet, TAKE 1 TABLET BY MOUTH EVERY DAY IN THE MORNING, Disp: 90 tablet, Rfl: 0 .  silver sulfADIAZINE (SILVADENE) 1 % cream, Apply pea-sized amount to wound daily., Disp: 50 g, Rfl: 0 .  torsemide (DEMADEX) 20 MG tablet, Take one tablet by mouth daily as directed., Disp: 10 tablet, Rfl: 0  Social History   Tobacco Use  Smoking Status Never Smoker  Smokeless Tobacco Never Used    No Known Allergies Objective:  There were no vitals filed for this visit. There is no height or weight on file to calculate BMI. Constitutional Well developed. Well nourished.  Vascular Dorsalis pedis pulses palpable bilaterally. Posterior tibial pulses non-palpable bilaterally. Capillary refill normal to all digits.  No  cyanosis or clubbing noted. Pedal hair growth normal.  Neurologic Normal speech. Oriented to person, place, and time. Epicritic sensation to light touch grossly present bilaterally. Protective sensation absent bilat.  Dermatologic Nails elongated and thickened. No skin lesions.  Ulceration healed R 5th MPJ with epithelialization.  Orthopedic: Normal joint ROM without pain or crepitus bilaterally. No visible deformities. No bony tenderness.   Radiographs: Taken and reviewed. No underlying osseous erosion. No acute fracture. Assessment:   1. Diabetic ulcer of other part of right foot associated with type 2 diabetes mellitus, limited to breakdown of skin ([place])   2. Diabetes mellitus type 2 with peripheral artery disease ([place])   3. Type 2 diabetes mellitus with hyperlipidemia ([place])   4. Uncontrolled type 2 diabetes mellitus with hyperglycemia, without long-term current use of insulin ([place])    Plan:  Patient was evaluated and treated and all questions answered.  Ulcer right fifth MPJ with blistering, small abscess. -No open area upon debridement. -Continue surgical shoe x1 week. -Transition to DM shoe next week. -No need for dressing today.   Return in about 2 weeks (around 10/30/2017) for wound care, right.

## 2017-10-24 NOTE — Progress Notes (Signed)
Subjective:  Patient ID: Travis Blair, male    DOB: [birth year],  MRN: [id number]  No chief complaint on file.   67 y.o. male presents with the above complaint. Believes the wound to be improved.  Review of Systems: Negative except as noted in the HPI. Denies N/V/F/Ch.  Past Medical History:  Diagnosis Date  . Asthma    as a child  . Chronic kidney disease    Sees Dr [name]  . Coronary artery disease   . Diabetes mellitus without complication ([place])   . DVT (deep venous thrombosis) ([place])   . Hyperlipidemia   . Hypertension   . Myocardial infarction ([place])   . Obesity   . Pneumonia 2018  . Sleep apnea     Current Outpatient Medications:  .  apixaban (ELIQUIS) 5 MG TABS tablet, Take 1 tablet (5 mg total) by mouth 2 (two) times daily., Disp: 60 tablet, Rfl: 11 .  aspirin EC 81 MG tablet, Take 81 mg by mouth daily., Disp: , Rfl:  .  atorvastatin (LIPITOR) 20 MG tablet, Take 1 tablet (20 mg total) by mouth daily., Disp: 30 tablet, Rfl: 11 .  doxycycline (VIBRA-TABS) 100 MG tablet, Take 1 tablet (100 mg total) by mouth 2 (two) times daily., Disp: 20 tablet, Rfl: 0 .  glipiZIDE (GLUCOTROL XL) 10 MG 24 hr tablet, TAKE 1 TABLET BY MOUTH EVERY DAY IN THE MORNING, Disp: 90 tablet, Rfl: 0 .  silver sulfADIAZINE (SILVADENE) 1 % cream, Apply pea-sized amount to wound daily., Disp: 50 g, Rfl: 0 .  torsemide (DEMADEX) 20 MG tablet, Take one tablet by mouth daily as directed., Disp: 10 tablet, Rfl: 0  Social History   Tobacco Use  Smoking Status Never Smoker  Smokeless Tobacco Never Used    No Known Allergies Objective:  There were no vitals filed for this visit. There is no height or weight on file to calculate BMI. Constitutional Well developed. Well nourished.  Vascular Dorsalis pedis pulses palpable bilaterally. Posterior tibial pulses palpable bilaterally. Capillary refill normal to all digits.  No cyanosis or clubbing noted. Pedal hair growth normal.  Neurologic Normal  speech. Oriented to person, place, and time. Epicritic sensation to light touch grossly present bilaterally.  Dermatologic Nails well groomed and normal in appearance. No skin lesions.  Ulceration about the fifth MPJ split thickness.  32.  No probe to bone.  No active purulence.  Serous drainage only.  Orthopedic: Normal joint ROM without pain or crepitus bilaterally. No visible deformities. No bony tenderness.   Radiographs: Taken and reviewed. No underlying osseous erosion. No acute fracture. Assessment:   1. Diabetic ulcer of other part of right foot associated with type 2 diabetes mellitus, unspecified ulcer stage ([place])    Plan:  Patient was evaluated and treated and all questions answered.  Ulcer right fifth MPJ with blistering, small abscess. -Ulcer debrided as below -Wound appears to be improving -Follow-up in 1 week for recheck -Dressed with medihoney and dry sterile dressing daily -Continue offloading  Procedure: Excisional Debridement of Wound Rationale: Removal of non-viable soft tissue from the wound to promote healing.  Anesthesia: none Pre-Debridement Wound Measurements: 2 cm x 2 cm x 0.3 cm  Post-Debridement Wound Measurements: 3 cm x 2 cm x 0.3 cm  Type of Debridement: Sharp Excisional Tissue Removed: Non-viable soft tissue Depth of Debridement: subcutaneous tissue. Technique: Sharp excisional debridement to bleeding, viable wound base.  Dressing: Dry, sterile, compression dressing. Disposition: Patient tolerated procedure well. Patient to return in 1  week for follow-up.     No follow-ups on file.

## 2017-10-27 ENCOUNTER — Other Ambulatory Visit: Payer: Self-pay | Admitting: Cardiovascular Disease

## 2017-10-29 ENCOUNTER — Other Ambulatory Visit: Payer: Self-pay | Admitting: Nurse Practitioner

## 2017-10-29 MED ORDER — SILDENAFIL CITRATE 20 MG PO TABS: ORAL_TABLET | ORAL | 3 refills | Status: DC

## 2017-10-30 ENCOUNTER — Ambulatory Visit: Payer: PPO | Admitting: Podiatry

## 2017-10-30 ENCOUNTER — Encounter: Payer: Self-pay | Admitting: Podiatry

## 2017-10-30 DIAGNOSIS — L97519 Non-pressure chronic ulcer of other part of right foot with unspecified severity: Secondary | ICD-10-CM

## 2017-10-30 DIAGNOSIS — E1151 Type 2 diabetes mellitus with diabetic peripheral angiopathy without gangrene: Secondary | ICD-10-CM

## 2017-10-30 DIAGNOSIS — E11621 Type 2 diabetes mellitus with foot ulcer: Secondary | ICD-10-CM

## 2017-10-30 DIAGNOSIS — E1169 Type 2 diabetes mellitus with other specified complication: Secondary | ICD-10-CM

## 2017-10-30 DIAGNOSIS — E785 Hyperlipidemia, unspecified: Secondary | ICD-10-CM

## 2017-10-30 NOTE — Progress Notes (Signed)
Subjective:  Patient ID: Travis Blair, male    DOB: 1950-10-11,  MRN: [id number]  Chief Complaint  Patient presents with  . Foot Ulcer    right 5th MPJ - 2 week follow up    67 y.o. male presents with the above complaint.  Back in his regular diabetic shoes.  States that the area is doing well.  Has not been dressing exclusively heel.  Review of Systems: Negative except as noted in the HPI. Denies N/V/F/Ch.  Past Medical History:  Diagnosis Date  . Asthma    as a child  . Chronic kidney disease    Sees Dr [name]  . Coronary artery disease   . Diabetes mellitus without complication ([place])   . DVT (deep venous thrombosis) ([place])   . Hyperlipidemia   . Hypertension   . Myocardial infarction ([place])   . Obesity   . Pneumonia 2018  . Sleep apnea     Current Outpatient Medications:  .  apixaban (ELIQUIS) 5 MG TABS tablet, Take 1 tablet (5 mg total) by mouth 2 (two) times daily., Disp: 60 tablet, Rfl: 11 .  aspirin EC 81 MG tablet, Take 81 mg by mouth daily., Disp: , Rfl:  .  atorvastatin (LIPITOR) 10 MG tablet, Take 10 mg by mouth daily., Disp: , Rfl: 3 .  doxycycline (VIBRA-TABS) 100 MG tablet, Take 1 tablet (100 mg total) by mouth 2 (two) times daily. (Patient not taking: Reported on 10/30/2017), Disp: 20 tablet, Rfl: 0 .  glipiZIDE (GLUCOTROL XL) 10 MG 24 hr tablet, TAKE 1 TABLET BY MOUTH EVERY DAY IN THE MORNING, Disp: 90 tablet, Rfl: 0 .  sildenafil (REVATIO) 20 MG tablet, Take 2-3 tablets as needed for erectile dysfunction, Disp: 50 tablet, Rfl: 3 .  silver sulfADIAZINE (SILVADENE) 1 % cream, Apply pea-sized amount to wound daily., Disp: 50 g, Rfl: 0 .  torsemide (DEMADEX) 20 MG tablet, Take one tablet by mouth daily as directed. (Patient not taking: Reported on 10/30/2017), Disp: 10 tablet, Rfl: 0  Social History   Tobacco Use  Smoking Status Never Smoker  Smokeless Tobacco Never Used    No Known Allergies Objective:  There were no vitals filed for this visit. There  is no height or weight on file to calculate BMI. Constitutional Well developed. Well nourished.  Vascular Dorsalis pedis pulses palpable bilaterally. Posterior tibial pulses non-palpable bilaterally. Capillary refill normal to all digits.  No cyanosis or clubbing noted. Pedal hair growth normal.  Neurologic Normal speech. Oriented to person, place, and time. Epicritic sensation to light touch grossly present bilaterally. Protective sensation absent bilat.  Dermatologic Nails elongated and thickened. No skin lesions. Ulceration healed R 5th MPJ with epithelialization.  Orthopedic: Normal joint ROM without pain or crepitus bilaterally. No visible deformities. No bony tenderness.   Radiographs: Taken and reviewed. No underlying osseous erosion. No acute fracture. Assessment:   1. Diabetic ulcer of other part of right foot associated with type 2 diabetes mellitus, unspecified ulcer stage ([place])   2. Diabetes mellitus type 2 with peripheral artery disease ([place])   3. Type 2 diabetes mellitus with hyperlipidemia ([place])    Plan:  Patient was evaluated and treated and all questions answered.  Ulcer right fifth MPJ with blistering, small abscess. -Wound area remains healed however still hyperkeratosis forming -Will make appointment for follow-up with orthotist for possible modification of diabetic insert -Continue diabetic inserts -Discussed the patient should be not be able to offload the insert he may benefit from surgical offloading  to prevent recurrence of ulceration   Return in about 4 weeks (around 11/27/2017).

## 2017-11-04 ENCOUNTER — Ambulatory Visit: Payer: PPO | Admitting: Orthotics

## 2017-11-04 DIAGNOSIS — E1169 Type 2 diabetes mellitus with other specified complication: Secondary | ICD-10-CM

## 2017-11-04 DIAGNOSIS — E1151 Type 2 diabetes mellitus with diabetic peripheral angiopathy without gangrene: Secondary | ICD-10-CM

## 2017-11-04 DIAGNOSIS — E11621 Type 2 diabetes mellitus with foot ulcer: Secondary | ICD-10-CM

## 2017-11-04 DIAGNOSIS — E785 Hyperlipidemia, unspecified: Secondary | ICD-10-CM

## 2017-11-04 DIAGNOSIS — L97519 Non-pressure chronic ulcer of other part of right foot with unspecified severity: Principal | ICD-10-CM

## 2017-11-04 NOTE — Progress Notes (Signed)
Added k-wedge under 5th met head rt.

## 2017-11-10 ENCOUNTER — Other Ambulatory Visit: Payer: Self-pay | Admitting: Cardiovascular Disease

## 2017-11-12 ENCOUNTER — Ambulatory Visit (INDEPENDENT_AMBULATORY_CARE_PROVIDER_SITE_OTHER): Payer: PPO | Admitting: Vascular Surgery

## 2017-11-12 ENCOUNTER — Encounter: Payer: Self-pay | Admitting: Vascular Surgery

## 2017-11-12 VITALS — BP 120/66 | HR 74 | Temp 97.2°F | Resp 16 | Ht 74.0 in | Wt 315.0 lb

## 2017-11-12 DIAGNOSIS — I872 Venous insufficiency (chronic) (peripheral): Secondary | ICD-10-CM | POA: Diagnosis not present

## 2017-11-12 NOTE — Progress Notes (Signed)
Patient name: Travis Blair MRN: [id number] DOB: Jul 10, 1950 Sex: male  REASON FOR VISIT:   Follow-up of chronic venous insufficiency.  HPI:   Travis Blair is a pleasant 67 y.o. male who was seen by Dr. Servando Snare on 08/29/2017 with chronic venous insufficiency.  At that time he was noted to have reflux in the left great saphenous vein and also deep venous reflux in the popliteal vein.  He was to begin wearing thigh-high compression stockings with a gradient of 20 to 30 mmHg.  He comes in for a 47-month follow-up visit.  He states that the compression stockings have not helped his symptoms significantly.  He does try to elevate his legs some.  He does not like to take ibuprofen.  He continues to have some aching pain in his legs with standing and also significant left leg swelling.  His most recent duplex scan showed no evidence of DVT.  He does remain on Eliquis which I think at this point can be discontinued.  Past Medical History:  Diagnosis Date  . Asthma    as a child  . Chronic kidney disease    Sees Dr [name]  . Coronary artery disease   . Diabetes mellitus without complication ([place])   . DVT (deep venous thrombosis) ([place])   . Hyperlipidemia   . Hypertension   . Myocardial infarction ([place])   . Obesity   . Pneumonia 2018  . Sleep apnea     Family History  Problem Relation Age of Onset  . Cancer Other   . Hypertension Other   . Hyperlipidemia Other   . Heart disease Other   . Cancer Mother   . Heart disease Mother   . Cancer Father   . Heart disease Father   . Lymphoma Sister     SOCIAL HISTORY: Social History   Tobacco Use  . Smoking status: Never Smoker  . Smokeless tobacco: Never Used  Substance Use Topics  . Alcohol use: No    No Known Allergies  Current Outpatient Medications  Medication Sig Dispense Refill  . apixaban (ELIQUIS) 5 MG TABS tablet Take 1 tablet (5 mg total) by mouth 2 (two) times daily. 60 tablet 11  . aspirin EC 81 MG  tablet Take 81 mg by mouth daily.    Marland Kitchen atorvastatin (LIPITOR) 10 MG tablet TAKE 1 TABLET BY MOUTH EVERY DAY 90 tablet 1  . glipiZIDE (GLUCOTROL XL) 10 MG 24 hr tablet TAKE 1 TABLET BY MOUTH EVERY DAY IN THE MORNING 90 tablet 0  . sildenafil (REVATIO) 20 MG tablet Take 2-3 tablets as needed for erectile dysfunction 50 tablet 3  . silver sulfADIAZINE (SILVADENE) 1 % cream Apply pea-sized amount to wound daily. 50 g 0  . torsemide (DEMADEX) 20 MG tablet Take one tablet by mouth daily as directed. 10 tablet 0  . doxycycline (VIBRA-TABS) 100 MG tablet Take 1 tablet (100 mg total) by mouth 2 (two) times daily. (Patient not taking: Reported on 10/30/2017) 20 tablet 0   No current facility-administered medications for this visit.     REVIEW OF SYSTEMS:  [X]  denotes positive finding, [ ]  denotes negative finding Cardiac  Comments:  Chest pain or chest pressure:    Shortness of breath upon exertion:    Short of breath when lying flat:    Irregular heart rhythm:        Vascular    Pain in calf, thigh, or hip brought on by ambulation:    Pain  in feet at night that wakes you up from your sleep:     Blood clot in your veins:    Leg swelling:         Pulmonary    Oxygen at home:    Productive cough:     Wheezing:         Neurologic    Sudden weakness in arms or legs:     Sudden numbness in arms or legs:     Sudden onset of difficulty speaking or slurred speech:    Temporary loss of vision in one eye:     Problems with dizziness:         Gastrointestinal    Blood in stool:     Vomited blood:         Genitourinary    Burning when urinating:     Blood in urine:        Psychiatric    Major depression:         Hematologic    Bleeding problems:    Problems with blood clotting too easily:        Skin    Rashes or ulcers:        Constitutional    Fever or chills:     PHYSICAL EXAM:   Vitals:   11/12/17 1114  BP: 120/66  Pulse: 74  Resp: 16  Temp: (!) 97.2 F (36.2 C)  SpO2:  98%  Weight: (!) 315 lb (142.9 kg)  Height: 6\' 2"  (1.88 m)    GENERAL: The patient is a well-nourished male, in no acute distress. The vital signs are documented above. CARDIAC: There is a regular rate and rhythm.  VASCULAR: I do not detect carotid bruits. He does have pedal pulses bilaterally. He has bilateral lower extremity swelling which is more significant on the left side. He does have some hyperpigmentation consistent with chronic venous insufficiency bilaterally. PULMONARY: There is good air exchange bilaterally without wheezing or rales. ABDOMEN: Soft and non-tender with normal pitched bowel sounds.  MUSCULOSKELETAL: There are no major deformities or cyanosis. NEUROLOGIC: No focal weakness or paresthesias are detected. SKIN: There are no ulcers or rashes noted. PSYCHIATRIC: The patient has a normal affect.  DATA:    VENOUS DUPLEX: I reviewed his venous duplex scan that was done on 08/29/2017.  On the left side he was noted to have significant reflux in the great saphenous vein from the proximal calf to the saphenofemoral junction.  The vein was enlarged up to 0.61 cm in the mid thigh.  There was no evidence of deep venous thrombosis on this study.  I did look at the left great saphenous vein myself today with the SonoSite he does have significant reflux in the left great saphenous vein which is significantly dilated.  I think we would likely cannulate the vein at about the level of the knee.  MEDICAL ISSUES:   CHRONIC VENOUS INSUFFICIENCY: This patient has CEAP clinical class 4a venous disease.  He has persistent symptoms despite conservative treatment.  I think he is a reasonable candidate for endovenous laser ablation of the left great saphenous vein.  I would likely cannulate the vein at approximately the level of the knee.  I have discussed the indications for endovenous laser ablation of the left GSV, that is to lower the pressure in the veins and potentially help relieve the  symptoms from venous hypertension. I have also discussed alternative options including conservative treatment with leg elevation, compression therapy, exercise, avoiding prolonged  sitting and standing, and weight management. I have discussed the potential complications of the procedure, including, but not limited to: bleeding, bruising, leg swelling, nerve injury, skin burns, significant pain from phlebitis, deep venous thrombosis, or failure of the vein to close.  I have also explained that venous insufficiency is a chronic disease, and that the patient is at risk for recurrent varicose veins in the future.  All of the patient's questions were encouraged and answered. They are agreeable to proceed.   [name] [facility] [phone]

## 2017-11-17 ENCOUNTER — Other Ambulatory Visit: Payer: PPO | Admitting: *Deleted

## 2017-11-17 DIAGNOSIS — E782 Mixed hyperlipidemia: Secondary | ICD-10-CM | POA: Diagnosis not present

## 2017-11-17 LAB — LIPID PANEL
CHOL/HDL RATIO: 4.9 ratio (ref 0.0–5.0)
Cholesterol, Total: 176 mg/dL (ref 100–199)
HDL: 36 mg/dL — AB (ref >39)
LDL Calculated: 110 mg/dL — ABNORMAL HIGH (ref 0–99)
Triglycerides: 151 mg/dL — ABNORMAL HIGH (ref 0–149)
VLDL CHOLESTEROL CAL: 30 mg/dL (ref 5–40)

## 2017-11-17 LAB — HEPATIC FUNCTION PANEL
ALT: 11 IU/L (ref 0–44)
AST: 10 IU/L (ref 0–40)
Albumin: 3.8 g/dL (ref 3.6–4.8)
Alkaline Phosphatase: 67 IU/L (ref 39–117)
Bilirubin, Direct: 0.06 mg/dL (ref 0.00–0.40)
Total Protein: 6.4 g/dL (ref 6.0–8.5)

## 2017-11-18 DIAGNOSIS — Z23 Encounter for immunization: Secondary | ICD-10-CM | POA: Diagnosis not present

## 2017-11-18 DIAGNOSIS — I8290 Acute embolism and thrombosis of unspecified vein: Secondary | ICD-10-CM | POA: Diagnosis not present

## 2017-11-18 DIAGNOSIS — Z6841 Body Mass Index (BMI) 40.0 and over, adult: Secondary | ICD-10-CM | POA: Diagnosis not present

## 2017-11-18 DIAGNOSIS — I480 Paroxysmal atrial fibrillation: Secondary | ICD-10-CM | POA: Diagnosis not present

## 2017-11-18 DIAGNOSIS — I10 Essential (primary) hypertension: Secondary | ICD-10-CM | POA: Diagnosis not present

## 2017-11-18 DIAGNOSIS — I2510 Atherosclerotic heart disease of native coronary artery without angina pectoris: Secondary | ICD-10-CM | POA: Diagnosis not present

## 2017-11-18 DIAGNOSIS — I5032 Chronic diastolic (congestive) heart failure: Secondary | ICD-10-CM | POA: Diagnosis not present

## 2017-11-18 DIAGNOSIS — E1129 Type 2 diabetes mellitus with other diabetic kidney complication: Secondary | ICD-10-CM | POA: Diagnosis not present

## 2017-11-18 NOTE — Progress Notes (Signed)
Patient ID: Travis Blair                 DOB: 04-13-1950                    MRN: [id number]     HPI: Travis Blair is a 67 y.o. male patient of Dr. Acie Blair that presents today for lipid follow up.  PMH is significant for CAD s/p CABG, CHF, afib, DVT, CKD, HTN, HLD, DM, and OSA. He was seen for follow up with Dr. Acie Blair in April and reports muscle symptoms while on rosuvastatin. This was switched to atorvastatin 10mg  daily and was most recently titrated to 20mg  daily.  Pt presents today in good spirits. He reports tolerating his atorvastatin 20mg  well, however ran out of his medication a week ago (this was 1 week before his labs were drawn). This explains increase in LDL. TG have improved since pt was fasting for this lab and had not been for panel 2-3 months ago.  Pt remains active at work, however has been eating more poorly recently since he is in the process of moving. He has been eating more pizza and fried food than normal, as well as eating out at restaurants more frequently. Expects this to settle down once he moves in the beginning of November. He saw Dr Travis Blair for DM management yesterday and was started on Jardiance.  Current Medications: atorvastatin 20mg  daily Intolerances: rosuvastatin 20mg  daily (leg cramps and leg aching - the symptoms did improve when discontinued), lovaza 2g BID (stopped by provider), fenofibrate 145mg  daily (does not recall) Risk Factors: CAD s/p CABG LDL Goal: 70mg /dL  Diet: Eats out occasionally. He eats at family restaurants mostly. He has been eating baked and broiled meat. He avoids fried foods. He does eat vegetables regularly. Avoids snacking between meals. He drinks water mostly. Rare soda or tea.   Exercise: He is active with work as a contraction. He is enrolled in cardiac rehab.   Family History: Cancer in his father, mother, and other; Heart disease in his father, mother, and other; Hyperlipidemia in his other; Hypertension in his  other; Lymphoma in his sister  Social History: denies tobacco and alcohol  Labs: 11/17/17: TC 176, TG 151, HDL 36, LDL 110 (atorvastatin 20mg  daily, fasting) 09/02/17:TC 176, TG 202, HDL 38, LDL 98 (atorvastatin 10mg  daily but had eaten eggs, sausage and toast) 06/10/17: TC 174, TG 135, HDL 37, LDL 110 (no therapy)  Past Medical History:  Diagnosis Date  . Asthma    as a child  . Chronic kidney disease    Sees Dr Travis Blair  . Coronary artery disease   . Diabetes mellitus without complication ([place])   . DVT (deep venous thrombosis) ([place])   . Hyperlipidemia   . Hypertension   . Myocardial infarction ([place])   . Obesity   . Pneumonia 2018  . Sleep apnea     Current Outpatient Medications on File Prior to Visit  Medication Sig Dispense Refill  . apixaban (ELIQUIS) 5 MG TABS tablet Take 1 tablet (5 mg total) by mouth 2 (two) times daily. 60 tablet 11  . aspirin EC 81 MG tablet Take 81 mg by mouth daily.    Marland Kitchen atorvastatin (LIPITOR) 10 MG tablet TAKE 1 TABLET BY MOUTH EVERY DAY 90 tablet 1  . doxycycline (VIBRA-TABS) 100 MG tablet Take 1 tablet (100 mg total) by mouth 2 (two) times daily. (Patient not taking: Reported on 10/30/2017) 20 tablet 0  .  glipiZIDE (GLUCOTROL XL) 10 MG 24 hr tablet TAKE 1 TABLET BY MOUTH EVERY DAY IN THE MORNING 90 tablet 0  . sildenafil (REVATIO) 20 MG tablet Take 2-3 tablets as needed for erectile dysfunction 50 tablet 3  . silver sulfADIAZINE (SILVADENE) 1 % cream Apply pea-sized amount to wound daily. 50 g 0  . torsemide (DEMADEX) 20 MG tablet Take one tablet by mouth daily as directed. 10 tablet 0   No current facility-administered medications on file prior to visit.     No Known Allergies  Assessment/Plan:  1. Hyperlipidemia - LDL remains above goal < 70 despite dose increase in atorvastatin, however pt ran out of statin a week before labs were drawn. TG did improve as pt was fasting for this lab draw. He had been tolerating dose increase of atorvastatin  well. Will further increase dose to atorvastatin 40mg  daily. Discussed limiting fried food and restaurant food and increasing intake of unsaturated fats like fish, nuts, and avocado. Diet will improve after pt moves in a few weeks as well. Will recheck fasting lipid panel in 3 months. Advised pt to call clinic if he develops side effects on higher dose of atorvastatin.   Travis Blair, PharmD, BCACP, [place] [address] [place], [place] [address] Phone: [phone]; Fax: [phone] 11/18/2017 2:46 PM

## 2017-11-19 ENCOUNTER — Ambulatory Visit (INDEPENDENT_AMBULATORY_CARE_PROVIDER_SITE_OTHER): Payer: PPO | Admitting: Pharmacist

## 2017-11-19 DIAGNOSIS — I872 Venous insufficiency (chronic) (peripheral): Secondary | ICD-10-CM | POA: Diagnosis not present

## 2017-11-19 DIAGNOSIS — N183 Chronic kidney disease, stage 3 (moderate): Secondary | ICD-10-CM | POA: Diagnosis not present

## 2017-11-19 DIAGNOSIS — E782 Mixed hyperlipidemia: Secondary | ICD-10-CM

## 2017-11-19 DIAGNOSIS — D631 Anemia in chronic kidney disease: Secondary | ICD-10-CM | POA: Diagnosis not present

## 2017-11-19 DIAGNOSIS — E1122 Type 2 diabetes mellitus with diabetic chronic kidney disease: Secondary | ICD-10-CM | POA: Diagnosis not present

## 2017-11-19 DIAGNOSIS — N2581 Secondary hyperparathyroidism of renal origin: Secondary | ICD-10-CM | POA: Diagnosis not present

## 2017-11-19 DIAGNOSIS — N189 Chronic kidney disease, unspecified: Secondary | ICD-10-CM | POA: Diagnosis not present

## 2017-11-19 DIAGNOSIS — I4891 Unspecified atrial fibrillation: Secondary | ICD-10-CM | POA: Diagnosis not present

## 2017-11-19 DIAGNOSIS — I2510 Atherosclerotic heart disease of native coronary artery without angina pectoris: Secondary | ICD-10-CM | POA: Diagnosis not present

## 2017-11-19 DIAGNOSIS — I129 Hypertensive chronic kidney disease with stage 1 through stage 4 chronic kidney disease, or unspecified chronic kidney disease: Secondary | ICD-10-CM | POA: Diagnosis not present

## 2017-11-19 MED ORDER — ATORVASTATIN CALCIUM 40 MG PO TABS: 40.0000 mg | ORAL_TABLET | Freq: Every day | ORAL | 11 refills | Status: DC

## 2017-11-19 NOTE — Patient Instructions (Addendum)
Increase your atorvastatin (Lipitor) to 40mg  once a day  This will help to lower your LDL to goal < 70  Call Megan if you have any problems tolerating the higher dose at [phone]  Work on decreasing your intake of fried foods and saturated fats  Increase your intake of omega 3s which are in foods like fish, nuts, and avocados  Recheck fasting cholesterol panel on Monday, January 13th any time after 7:30am

## 2017-11-25 ENCOUNTER — Other Ambulatory Visit: Payer: Self-pay | Admitting: *Deleted

## 2017-11-25 DIAGNOSIS — I872 Venous insufficiency (chronic) (peripheral): Secondary | ICD-10-CM

## 2017-11-26 ENCOUNTER — Telehealth: Payer: Self-pay | Admitting: Vascular Surgery

## 2017-11-26 NOTE — Telephone Encounter (Signed)
-----   Message from Rolla Flatten, RN sent at 11/25/2017 11:35 AM EDT ----- He needs a one wk fu lab and [facility] Dock on 12/19.

## 2017-11-27 ENCOUNTER — Ambulatory Visit: Payer: PPO | Admitting: Podiatry

## 2017-11-27 DIAGNOSIS — E11621 Type 2 diabetes mellitus with foot ulcer: Secondary | ICD-10-CM

## 2017-11-27 DIAGNOSIS — E1151 Type 2 diabetes mellitus with diabetic peripheral angiopathy without gangrene: Secondary | ICD-10-CM | POA: Diagnosis not present

## 2017-11-27 DIAGNOSIS — L97519 Non-pressure chronic ulcer of other part of right foot with unspecified severity: Secondary | ICD-10-CM | POA: Diagnosis not present

## 2017-11-27 DIAGNOSIS — B351 Tinea unguium: Secondary | ICD-10-CM | POA: Diagnosis not present

## 2017-12-09 NOTE — Progress Notes (Signed)
Subjective:  Patient ID: Travis Blair, male    DOB: 1950/04/16,  MRN: [id number]  Chief Complaint  Patient presents with  . Wound Check    right 5th metatarsal - looks great  . Nail Problem    trim toenails, left foot (did right last visit)    67 y.o. male presents with the above complaint.  Doing well states the wound remains healed.  Review of Systems: Negative except as noted in the HPI. Denies N/V/F/Ch.  Past Medical History:  Diagnosis Date  . Asthma    as a child  . Chronic kidney disease    Sees Dr [name]  . Coronary artery disease   . Diabetes mellitus without complication ([place])   . DVT (deep venous thrombosis) ([place])   . Hyperlipidemia   . Hypertension   . Myocardial infarction ([place])   . Obesity   . Pneumonia 2018  . Sleep apnea     Current Outpatient Medications:  .  apixaban (ELIQUIS) 5 MG TABS tablet, Take 1 tablet (5 mg total) by mouth 2 (two) times daily., Disp: 60 tablet, Rfl: 11 .  aspirin EC 81 MG tablet, Take 81 mg by mouth daily., Disp: , Rfl:  .  atorvastatin (LIPITOR) 40 MG tablet, Take 1 tablet (40 mg total) by mouth daily., Disp: 30 tablet, Rfl: 11 .  empagliflozin (JARDIANCE) 10 MG TABS tablet, Take 10 mg by mouth daily., Disp: , Rfl:  .  glipiZIDE (GLUCOTROL XL) 10 MG 24 hr tablet, TAKE 1 TABLET BY MOUTH EVERY DAY IN THE MORNING, Disp: 90 tablet, Rfl: 0 .  sildenafil (REVATIO) 20 MG tablet, Take 2-3 tablets as needed for erectile dysfunction, Disp: 50 tablet, Rfl: 3 .  silver sulfADIAZINE (SILVADENE) 1 % cream, Apply pea-sized amount to wound daily., Disp: 50 g, Rfl: 0 .  torsemide (DEMADEX) 20 MG tablet, Take one tablet by mouth daily as directed., Disp: 10 tablet, Rfl: 0  Social History   Tobacco Use  Smoking Status Never Smoker  Smokeless Tobacco Never Used    No Known Allergies Objective:  There were no vitals filed for this visit. There is no height or weight on file to calculate BMI. Constitutional Well developed. Well  nourished.  Vascular Dorsalis pedis pulses palpable bilaterally. Posterior tibial pulses non-palpable bilaterally. Capillary refill normal to all digits.  No cyanosis or clubbing noted. Pedal hair growth normal.  Neurologic Normal speech. Oriented to person, place, and time. Epicritic sensation to light touch grossly present bilaterally. Protective sensation absent bilat.  Dermatologic Nails elongated and thickened. No skin lesions. Ulceration healed R 5th MPJ with epithelialization.  Orthopedic: Normal joint ROM without pain or crepitus bilaterally. No visible deformities. No bony tenderness.   Radiographs: Taken and reviewed. No underlying osseous erosion. No acute fracture. Assessment:   1. Diabetic ulcer of other part of right foot associated with type 2 diabetes mellitus, unspecified ulcer stage ([place])   2. Diabetes mellitus type 2 with peripheral artery disease ([place])   3. Onychomycosis    Plan:  Patient was evaluated and treated and all questions answered.  History of ulcer right fifth MPJ -Remains healed. No new ulceration.   Diabetes with PAD, Onychomycosis -Educated on diabetic footcare. Diabetic risk level 1 -Nails x10 debrided sharply and manually with large nail nipper and rotary burr.   Procedure: Nail Debridement Rationale: Patient meets criteria for routine foot care due to PAD Type of Debridement: manual, sharp debridement. Instrumentation: Nail nipper, rotary burr. Number of Nails: 10  No follow-ups on file.

## 2017-12-11 ENCOUNTER — Encounter (HOSPITAL_COMMUNITY): Payer: PPO

## 2017-12-16 ENCOUNTER — Ambulatory Visit: Payer: PPO | Admitting: Cardiovascular Disease

## 2017-12-16 ENCOUNTER — Encounter: Payer: Self-pay | Admitting: Cardiovascular Disease

## 2017-12-16 ENCOUNTER — Encounter (HOSPITAL_COMMUNITY): Payer: PPO

## 2017-12-16 VITALS — BP 125/72 | HR 75 | Ht 74.0 in | Wt 318.4 lb

## 2017-12-16 DIAGNOSIS — I2510 Atherosclerotic heart disease of native coronary artery without angina pectoris: Secondary | ICD-10-CM | POA: Diagnosis not present

## 2017-12-16 DIAGNOSIS — E782 Mixed hyperlipidemia: Secondary | ICD-10-CM

## 2017-12-16 DIAGNOSIS — I5032 Chronic diastolic (congestive) heart failure: Secondary | ICD-10-CM | POA: Diagnosis not present

## 2017-12-16 MED ORDER — SILDENAFIL CITRATE 20 MG PO TABS: ORAL_TABLET | ORAL | 3 refills | Status: DC

## 2017-12-16 NOTE — Progress Notes (Signed)
Cardiology Office Note:    Date:  12/16/2017   ID:  Travis Blair, DOB Dec 02, 1950, MRN [id number]  PCP:  Crist Infante, MD  Cardiologist:  [name], MD    Referring MD: Crist Infante, MD   Problem list 1.  Coronary artery disease-status post coronary artery bypass grafting 2.  Chronic diastolic congestive heart failure 3.  Postoperative atrial fibrillation 4.  Deep vein thrombosis 5.  Chronic kidney disease 6.  Hypertension 7.  Hyperlipidemia 8.  Diabetes mellitus 9.  Obstructive sleep apnea  Chief Complaint  Patient presents with  . Coronary Artery Disease        Travis Range [place] Travis Reef) is a 67 y.o. male with a hx of CAD, DM Seen with wife [name]   Has had DOE with walking  -  Was placed on Imdur, Metoprolol  , passed out , Has CKD - Lisinopril was stopped.  Worse for the past month. Has a + stress test,   Cath shows severe CAD.   Has seen Dr. [name]  - recommnended nephrology consult.  Has not heard back from nephrologist   Has had swelling of left leg for 8 months .  No angina ( is diabetic)   February 26, 2017: Seen back today for follow-up of his coronary artery disease, diabetes mellitus, hypertension, and chronic renal insufficiency. He was found to have a DVT several months ago.  He was started on Eliquis. Has seen Dr. Ron Agee and is scheduled for CABG next Friday  BP is a bit high initially but was normal by the end of the visit   March 26, 2017:  Travis Reef is seen back today for follow-up of his coronary artery disease.  He had coronary artery bypass grafting in January, 2018.  Coronary artery bypass grafting x3 (left internal mammary artery to left anterior descending, saphenous vein graft to posterior descending, saphenous vein graft to circumflex marginal).  He had postoperative atrial fibrillation and was treated with amiodarone.  He was started back  on Eliquis.  Was on Eliquis for a DVT starting  Has had bilateral leg edema     May 27, 2017: Travis Reef is seen back today for follow-up visit.  He has a history of coronary artery bypass grafting approximately 3 months ago.  He had postoperative atrial fibrillation and has been on amiodarone.  He is been on Eliquis for his atrial fibrillation and also for his DVT. Has stopped his rosuvastatin due to leg cramps aching   December 16, 2017: Travis Reef is seen today for follow-up visit.  He has a history of CABG.  He had postoperative atrial fibrillation.  We had him on amiodarone and Eliquis for a while. Stopped his rosuvastatin because of the leg cramps.  We started atorvastatin 40 mg a day.  He seems to be tolerating the Atorvastatin well  Dr. Joylene Draft has restarted the Eliquis for his unprovoked DVT.  Still has DOE and fatigue  Getting IV iron . - he is anemic  Is having some vein work in his left leg in Dec.  Also has OSA .   Needs to get a new CPAP  Wt.   Is 62 today      Past Medical History:  Diagnosis Date  . Asthma    as a child  . Chronic kidney disease    Sees Dr [name]  . Coronary artery disease   . Diabetes mellitus without complication ([place])   . DVT (deep venous thrombosis) ([place])   .  Hyperlipidemia   . Hypertension   . Myocardial infarction ([place])   . Obesity   . Pneumonia 2018  . Sleep apnea     Past Surgical History:  Procedure Laterality Date  . COLONOSCOPY    . CORONARY ARTERY BYPASS GRAFT N/A 03/07/2017   Procedure: CORONARY ARTERY BYPASS GRAFTING (CABG), X 3 , USING RIGHT INTERNAL MAMMARY ARTERY, AND RIGHT LEG GREATER SAPHENOUS VEIN HARVESTED ENDOSCOPICALLY;  Surgeon: [name], MD;  Location: [facility];  Service: Open Heart Surgery;  Laterality: N/A;  . HAND SURGERY    . RIGHT/LEFT HEART CATH AND CORONARY ANGIOGRAPHY N/A 12/20/2016   Procedure: RIGHT/LEFT HEART CATH AND CORONARY ANGIOGRAPHY;  Surgeon: [name], MD;  Location: [facility];  Service: Cardiovascular;  Laterality: N/A;  . TEE WITHOUT CARDIOVERSION N/A 03/07/2017    Procedure: TRANSESOPHAGEAL ECHOCARDIOGRAM (TEE);  Surgeon: [name], [name], MD;  Location: [facility];  Service: Open Heart Surgery;  Laterality: N/A;  . TONSILLECTOMY      Current Medications: Current Meds  Medication Sig  . apixaban (ELIQUIS) 5 MG TABS tablet Take 1 tablet (5 mg total) by mouth 2 (two) times daily.  Marland Kitchen aspirin EC 81 MG tablet Take 81 mg by mouth daily.  Marland Kitchen atorvastatin (LIPITOR) 40 MG tablet Take 1 tablet (40 mg total) by mouth daily.  Marland Kitchen glipiZIDE (GLUCOTROL XL) 10 MG 24 hr tablet TAKE 1 TABLET BY MOUTH EVERY DAY IN THE MORNING  . torsemide (DEMADEX) 20 MG tablet Take one tablet by mouth daily as directed.     Allergies:   Patient has no known allergies.   Social History   Socioeconomic History  . Marital status: Married    Spouse name: Not on file  . Number of children: Not on file  . Years of education: Not on file  . Highest education level: Not on file  Occupational History  . Not on file  Social Needs  . Financial resource strain: Not on file  . Food insecurity:    Worry: Not on file    Inability: Not on file  . Transportation needs:    Medical: Not on file    Non-medical: Not on file  Tobacco Use  . Smoking status: Never Smoker  . Smokeless tobacco: Never Used  Substance and Sexual Activity  . Alcohol use: No  . Drug use: No  . Sexual activity: Never  Lifestyle  . Physical activity:    Days per week: Not on file    Minutes per session: Not on file  . Stress: Not on file  Relationships  . Social connections:    Talks on phone: Not on file    Gets together: Not on file    Attends religious service: Not on file    Active member of club or organization: Not on file    Attends meetings of clubs or organizations: Not on file    Relationship status: Not on file  Other Topics Concern  . Not on file  Social History Narrative  . Not on file     Family History: The patient's family history includes Cancer in his father, mother, and other; Heart  disease in his father, mother, and other; Hyperlipidemia in his other; Hypertension in his other; Lymphoma in his sister. ROS:   Please see the history of present illness.     All other systems reviewed and are negative.  EKGs/Labs/Other Studies Reviewed:    The following studies were reviewed today: Cath films, previous records.  Recent Labs: 03/08/2017: Magnesium 2.6 03/11/2017: TSH 4.235 03/13/2017: Hemoglobin 10.6; Platelets 210 04/16/2017: BUN 38; Creatinine, Ser 1.69; Potassium 4.9; Sodium 139 11/17/2017: ALT 11  Recent Lipid Panel    Component Value Date/Time   CHOL 176 11/17/2017 0758   TRIG 151 (H) 11/17/2017 0758   HDL 36 (L) 11/17/2017 0758   CHOLHDL 4.9 11/17/2017 0758   LDLCALC 110 (H) 11/17/2017 0758    Physical Exam: Blood pressure 125/72, pulse 75, height 6\' 2"  (1.88 m), weight (!) 318 lb 6.4 oz (144.4 kg), SpO2 96 %.  GEN:  Well nourished, well developed in no acute distress HEENT: Normal NECK: No JVD; No carotid bruits LYMPHATICS: No lymphadenopathy CARDIAC: RRR   RESPIRATORY:  Clear to auscultation without rales, wheezing or rhonchi  ABDOMEN: Soft, non-tender, non-distended MUSCULOSKELETAL:  1-2 + pitting edema  SKIN: Warm and dry NEUROLOGIC:  Alert and oriented x 3   EKG:      ASSESSMENT:    No diagnosis found. PLAN:     1.  Coronary artery disease:   Doing well.  No angina .     2.   CKD -  Managed by primary MD   3.  Paroxysmal atrial fibrillation:     No recurrent AF .   3.  Hyperlipidemia:   LDL is still elevated.   On Atorva 40 mg a day  Have advised him to work harder no a better diet, exercise program.   Consider changing to Rosuvastatin at his next visit    4.  DVT:     5.   DM - r  6. HTN   :     Medication Adjustments/Labs and Tests Ordered: Current medicines are reviewed at length with the patient today.  Concerns regarding medicines are outlined above.  No orders of the defined types were placed in this  encounter.  No orders of the defined types were placed in this encounter.   Signed, [name], MD  12/16/2017 2:42 PM    [facility]

## 2017-12-16 NOTE — Patient Instructions (Signed)
Medication Instructions:  Your physician recommends that you continue on your current medications as directed. Please refer to the Current Medication list given to you today.  If you need a refill on your cardiac medications before your next appointment, please call your pharmacy.    Lab work: Your physician recommends that you return for lab work in: 6 months on the day of or a few days before your office visit  You will need to FAST for this appointment - nothing to eat or drink after midnight the night before except water.    Testing/Procedures: None Ordered   Follow-Up: At [facility], you and your health needs are our priority.  As part of our continuing mission to provide you with exceptional heart care, we have created designated Provider Care Teams.  These Care Teams include your primary Cardiologist (physician) and Advanced Practice Providers (APPs -  Physician Assistants and Nurse Practitioners) who all work together to provide you with the care you need, when you need it. You will need a follow up appointment in:  6 months.  Please call our office 2 months in advance to schedule this appointment.  You may see Philip Nahser, MD or one of the following Advanced Practice Providers on your designated Care Team: Scott Weaver, PA-C Vin Bhagat, PA-C . Janine Hammond, NP   

## 2017-12-17 ENCOUNTER — Other Ambulatory Visit (HOSPITAL_COMMUNITY): Payer: Self-pay | Admitting: *Deleted

## 2017-12-17 NOTE — Discharge Instructions (Signed)
Epoetin Alfa injection °What is this medicine? °EPOETIN ALFA (e POE e tin AL fa) helps your body make more red blood cells. This medicine is used to treat anemia caused by chronic kidney failure, cancer chemotherapy, or HIV-therapy. It may also be used before surgery if you have anemia. °This medicine may be used for other purposes; ask your health care provider or pharmacist if you have questions. °COMMON BRAND NAME(S): Epogen, Procrit °What should I tell my health care provider before I take this medicine? °They need to know if you have any of these conditions: °-blood clotting disorders °-cancer patient not on chemotherapy °-cystic fibrosis °-heart disease, such as angina or heart failure °-hemoglobin level of 12 g/dL or greater °-high blood pressure °-low levels of folate, iron, or vitamin B12 °-seizures °-an unusual or allergic reaction to erythropoietin, albumin, benzyl alcohol, hamster proteins, other medicines, foods, dyes, or preservatives °-pregnant or trying to get pregnant °-breast-feeding °How should I use this medicine? °This medicine is for injection into a vein or under the skin. It is usually given by a health care professional in a hospital or clinic setting. °If you get this medicine at home, you will be taught how to prepare and give this medicine. Use exactly as directed. Take your medicine at regular intervals. Do not take your medicine more often than directed. °It is important that you put your used needles and syringes in a special sharps container. Do not put them in a trash can. If you do not have a sharps container, call your pharmacist or healthcare provider to get one. °A special MedGuide will be given to you by the pharmacist with each prescription and refill. Be sure to read this information carefully each time. °Talk to your pediatrician regarding the use of this medicine in children. While this drug may be prescribed for selected conditions, precautions do apply. °Overdosage: If you  think you have taken too much of this medicine contact a poison control center or emergency room at once. °NOTE: This medicine is only for you. Do not share this medicine with others. °What if I miss a dose? °If you miss a dose, take it as soon as you can. If it is almost time for your next dose, take only that dose. Do not take double or extra doses. °What may interact with this medicine? °Do not take this medicine with any of the following medications: °-darbepoetin alfa °This list may not describe all possible interactions. Give your health care provider a list of all the medicines, herbs, non-prescription drugs, or dietary supplements you use. Also tell them if you smoke, drink alcohol, or use illegal drugs. Some items may interact with your medicine. °What should I watch for while using this medicine? °Your condition will be monitored carefully while you are receiving this medicine. °You may need blood work done while you are taking this medicine. °What side effects may I notice from receiving this medicine? °Side effects that you should report to your doctor or health care professional as soon as possible: °-allergic reactions like skin rash, itching or hives, swelling of the face, lips, or tongue °-breathing problems °-changes in vision °-chest pain °-confusion, trouble speaking or understanding °-feeling faint or lightheaded, falls °-high blood pressure °-muscle aches or pains °-pain, swelling, warmth in the leg °-rapid weight gain °-severe headaches °-sudden numbness or weakness of the face, arm or leg °-trouble walking, dizziness, loss of balance or coordination °-seizures (convulsions) °-swelling of the ankles, feet, hands °-unusually weak or tired °  Side effects that usually do not require medical attention (report to your doctor or health care professional if they continue or are bothersome): °-diarrhea °-fever, chills (flu-like symptoms) °-headaches °-nausea, vomiting °-redness, stinging, or swelling at  site where injected °This list may not describe all possible side effects. Call your doctor for medical advice about side effects. You may report side effects to FDA at 1-800-FDA-1088. °Where should I keep my medicine? °Keep out of the reach of children. °Store in a refrigerator between 2 and 8 degrees C (36 and 46 degrees F). Do not freeze or shake. Throw away any unused portion if using a single-dose vial. Multi-dose vials can be kept in the refrigerator for up to 21 days after the initial dose. Throw away unused medicine. °NOTE: This sheet is a summary. It may not cover all possible information. If you have questions about this medicine, talk to your doctor, pharmacist, or health care provider. °© 2018 Elsevier/Gold Standard (2015-09-18 19:42:31) ° °

## 2017-12-18 ENCOUNTER — Ambulatory Visit (HOSPITAL_COMMUNITY)
Admission: RE | Admit: 2017-12-18 | Discharge: 2017-12-18 | Disposition: A | Discharge status: Home / Self Care | Payer: PPO | Source: Ambulatory Visit | Attending: Nephrology | Admitting: Nephrology

## 2017-12-18 DIAGNOSIS — D631 Anemia in chronic kidney disease: Secondary | ICD-10-CM | POA: Insufficient documentation

## 2017-12-18 DIAGNOSIS — N183 Chronic kidney disease, stage 3 (moderate): Secondary | ICD-10-CM | POA: Insufficient documentation

## 2017-12-18 LAB — IRON AND TIBC
Iron: 17 ug/dL — ABNORMAL LOW (ref 45–182)
Saturation Ratios: 4 % — ABNORMAL LOW (ref 17.9–39.5)
TIBC: 417 ug/dL (ref 250–450)
UIBC: 400 ug/dL

## 2017-12-18 LAB — POCT HEMOGLOBIN-HEMACUE: Hemoglobin: 7.7 g/dL — ABNORMAL LOW (ref 13.0–17.0)

## 2017-12-18 LAB — FERRITIN: Ferritin: 6 ng/mL — ABNORMAL LOW (ref 24–336)

## 2017-12-18 MED ORDER — EPOETIN ALFA-EPBX 2000 UNIT/ML IJ SOLN
2000.0000 [IU] | Freq: Once | INTRAMUSCULAR | Status: AC
  Administered 2017-12-18: 2000 [IU] via SUBCUTANEOUS
  Filled 2017-12-18: qty 1

## 2017-12-18 MED ORDER — EPOETIN ALFA-EPBX 3000 UNIT/ML IJ SOLN
3000.0000 [IU] | Freq: Once | INTRAMUSCULAR | Status: AC
  Administered 2017-12-18: 3000 [IU] via SUBCUTANEOUS
  Filled 2017-12-18: qty 1

## 2017-12-22 DIAGNOSIS — D225 Melanocytic nevi of trunk: Secondary | ICD-10-CM | POA: Diagnosis not present

## 2017-12-22 DIAGNOSIS — L2089 Other atopic dermatitis: Secondary | ICD-10-CM | POA: Diagnosis not present

## 2017-12-22 DIAGNOSIS — Z85828 Personal history of other malignant neoplasm of skin: Secondary | ICD-10-CM | POA: Diagnosis not present

## 2017-12-22 DIAGNOSIS — C44612 Basal cell carcinoma of skin of right upper limb, including shoulder: Secondary | ICD-10-CM | POA: Diagnosis not present

## 2017-12-22 DIAGNOSIS — L821 Other seborrheic keratosis: Secondary | ICD-10-CM | POA: Diagnosis not present

## 2017-12-22 DIAGNOSIS — D2372 Other benign neoplasm of skin of left lower limb, including hip: Secondary | ICD-10-CM | POA: Diagnosis not present

## 2017-12-22 DIAGNOSIS — C44629 Squamous cell carcinoma of skin of left upper limb, including shoulder: Secondary | ICD-10-CM | POA: Diagnosis not present

## 2017-12-22 DIAGNOSIS — L570 Actinic keratosis: Secondary | ICD-10-CM | POA: Diagnosis not present

## 2017-12-25 ENCOUNTER — Ambulatory Visit (HOSPITAL_COMMUNITY)
Admission: RE | Admit: 2017-12-25 | Discharge: 2017-12-25 | Disposition: A | Discharge status: Home / Self Care | Payer: PPO | Source: Ambulatory Visit | Attending: Nephrology | Admitting: Nephrology

## 2017-12-25 DIAGNOSIS — Z79899 Other long term (current) drug therapy: Secondary | ICD-10-CM | POA: Diagnosis not present

## 2017-12-25 DIAGNOSIS — Z5181 Encounter for therapeutic drug level monitoring: Secondary | ICD-10-CM | POA: Insufficient documentation

## 2017-12-25 DIAGNOSIS — D631 Anemia in chronic kidney disease: Secondary | ICD-10-CM | POA: Insufficient documentation

## 2017-12-25 DIAGNOSIS — N183 Chronic kidney disease, stage 3 (moderate): Secondary | ICD-10-CM | POA: Insufficient documentation

## 2017-12-25 LAB — POCT HEMOGLOBIN-HEMACUE: HEMOGLOBIN: 8.4 g/dL — AB (ref 13.0–17.0)

## 2017-12-25 MED ORDER — EPOETIN ALFA-EPBX 2000 UNIT/ML IJ SOLN
2000.0000 [IU] | Freq: Once | INTRAMUSCULAR | Status: AC
  Administered 2017-12-25: 2000 [IU] via SUBCUTANEOUS
  Filled 2017-12-25: qty 1

## 2017-12-25 MED ORDER — EPOETIN ALFA-EPBX 3000 UNIT/ML IJ SOLN
3000.0000 [IU] | Freq: Once | INTRAMUSCULAR | Status: AC
  Administered 2017-12-25: 3000 [IU] via SUBCUTANEOUS
  Filled 2017-12-25: qty 1

## 2018-01-02 ENCOUNTER — Ambulatory Visit (HOSPITAL_COMMUNITY)
Admission: RE | Admit: 2018-01-02 | Discharge: 2018-01-02 | Disposition: A | Discharge status: Home / Self Care | Payer: PPO | Source: Ambulatory Visit | Attending: Nephrology | Admitting: Nephrology

## 2018-01-02 DIAGNOSIS — N183 Chronic kidney disease, stage 3 (moderate): Secondary | ICD-10-CM | POA: Diagnosis not present

## 2018-01-02 DIAGNOSIS — D631 Anemia in chronic kidney disease: Secondary | ICD-10-CM | POA: Diagnosis not present

## 2018-01-02 LAB — POCT HEMOGLOBIN-HEMACUE: Hemoglobin: 8.4 g/dL — ABNORMAL LOW (ref 13.0–17.0)

## 2018-01-02 MED ORDER — EPOETIN ALFA-EPBX 2000 UNIT/ML IJ SOLN
2000.0000 [IU] | Freq: Once | INTRAMUSCULAR | Status: AC
  Administered 2018-01-02: 2000 [IU] via SUBCUTANEOUS
  Filled 2018-01-02: qty 1

## 2018-01-02 MED ORDER — EPOETIN ALFA-EPBX 3000 UNIT/ML IJ SOLN
3000.0000 [IU] | Freq: Once | INTRAMUSCULAR | Status: AC
  Administered 2018-01-02: 3000 [IU] via SUBCUTANEOUS
  Filled 2018-01-02: qty 1

## 2018-01-09 ENCOUNTER — Other Ambulatory Visit: Payer: Self-pay | Admitting: Cardiovascular Disease

## 2018-01-09 ENCOUNTER — Other Ambulatory Visit (HOSPITAL_COMMUNITY): Payer: Self-pay | Admitting: *Deleted

## 2018-01-12 ENCOUNTER — Ambulatory Visit (HOSPITAL_COMMUNITY)
Admission: RE | Admit: 2018-01-12 | Discharge: 2018-01-12 | Disposition: A | Discharge status: Home / Self Care | Payer: PPO | Source: Ambulatory Visit | Attending: Nephrology | Admitting: Nephrology

## 2018-01-12 DIAGNOSIS — Z5181 Encounter for therapeutic drug level monitoring: Secondary | ICD-10-CM | POA: Insufficient documentation

## 2018-01-12 DIAGNOSIS — Z79899 Other long term (current) drug therapy: Secondary | ICD-10-CM | POA: Insufficient documentation

## 2018-01-12 DIAGNOSIS — D631 Anemia in chronic kidney disease: Secondary | ICD-10-CM | POA: Diagnosis not present

## 2018-01-12 DIAGNOSIS — N183 Chronic kidney disease, stage 3 (moderate): Secondary | ICD-10-CM | POA: Insufficient documentation

## 2018-01-12 LAB — IRON AND TIBC
Iron: 14 ug/dL — ABNORMAL LOW (ref 45–182)
Saturation Ratios: 3 % — ABNORMAL LOW (ref 17.9–39.5)
TIBC: 426 ug/dL (ref 250–450)
UIBC: 412 ug/dL

## 2018-01-12 LAB — FERRITIN: FERRITIN: 8 ng/mL — AB (ref 24–336)

## 2018-01-12 LAB — POCT HEMOGLOBIN-HEMACUE: Hemoglobin: 7.1 g/dL — ABNORMAL LOW (ref 13.0–17.0)

## 2018-01-12 MED ORDER — EPOETIN ALFA-EPBX 2000 UNIT/ML IJ SOLN
2000.0000 [IU] | Freq: Once | INTRAMUSCULAR | Status: DC
  Filled 2018-01-12: qty 1

## 2018-01-12 MED ORDER — SODIUM CHLORIDE 0.9 % IV SOLN
510.0000 mg | INTRAVENOUS | Status: DC
  Administered 2018-01-12: 510 mg via INTRAVENOUS
  Filled 2018-01-12: qty 17

## 2018-01-12 MED ORDER — EPOETIN ALFA-EPBX 3000 UNIT/ML IJ SOLN
3000.0000 [IU] | Freq: Once | INTRAMUSCULAR | Status: DC
  Filled 2018-01-12: qty 1

## 2018-01-12 MED ORDER — EPOETIN ALFA-EPBX 10000 UNIT/ML IJ SOLN
10000.0000 [IU] | Freq: Once | INTRAMUSCULAR | Status: AC
  Administered 2018-01-12: 10000 [IU] via SUBCUTANEOUS
  Filled 2018-01-12: qty 1

## 2018-01-12 NOTE — Progress Notes (Signed)
hgb 7.1 via hemocue, pt reports fatigue and sob. Spoke with Darvin Neighbours in office, increase retacrit to 10,000 units every week, instruct pt to report to [facility] Thursday of this week to repeat labs, give feraheme '510mg'$  IV today and repeat in one week, instruct pt to send stool specimen kit ASAP. Instruct pt to report to ED if symptoms worsen. Pt verbalized understanding of the above insturctions

## 2018-01-12 NOTE — Discharge Instructions (Signed)

## 2018-01-15 DIAGNOSIS — N189 Chronic kidney disease, unspecified: Secondary | ICD-10-CM | POA: Diagnosis not present

## 2018-01-16 ENCOUNTER — Other Ambulatory Visit (HOSPITAL_COMMUNITY): Payer: Self-pay

## 2018-01-19 ENCOUNTER — Encounter (HOSPITAL_COMMUNITY): Payer: PPO

## 2018-01-19 ENCOUNTER — Ambulatory Visit (HOSPITAL_COMMUNITY)
Admission: RE | Admit: 2018-01-19 | Discharge: 2018-01-19 | Disposition: A | Discharge status: Home / Self Care | Payer: PPO | Source: Ambulatory Visit | Attending: Nephrology | Admitting: Nephrology

## 2018-01-19 DIAGNOSIS — N183 Chronic kidney disease, stage 3 (moderate): Secondary | ICD-10-CM | POA: Insufficient documentation

## 2018-01-19 DIAGNOSIS — D631 Anemia in chronic kidney disease: Secondary | ICD-10-CM | POA: Diagnosis not present

## 2018-01-19 LAB — IRON AND TIBC
IRON: 19 ug/dL — AB (ref 45–182)
Saturation Ratios: 5 % — ABNORMAL LOW (ref 17.9–39.5)
TIBC: 363 ug/dL (ref 250–450)
UIBC: 344 ug/dL

## 2018-01-19 LAB — POCT HEMOGLOBIN-HEMACUE: HEMOGLOBIN: 7.4 g/dL — AB (ref 13.0–17.0)

## 2018-01-19 LAB — FERRITIN: Ferritin: 69 ng/mL (ref 24–336)

## 2018-01-19 MED ORDER — EPOETIN ALFA-EPBX 10000 UNIT/ML IJ SOLN
10000.0000 [IU] | INTRAMUSCULAR | Status: DC
  Administered 2018-01-19: 10000 [IU] via SUBCUTANEOUS
  Filled 2018-01-19: qty 1

## 2018-01-19 MED ORDER — SODIUM CHLORIDE 0.9 % IV SOLN
510.0000 mg | INTRAVENOUS | Status: DC
  Administered 2018-01-19: 510 mg via INTRAVENOUS
  Filled 2018-01-19: qty 17

## 2018-01-19 NOTE — Progress Notes (Signed)
HemoCue 7.4 today. Pt here for 2nd dose of IV Feraheme and Retacrit which was increased from 5000 to 10000 on 01/12/18 per Dr. [name]. Pt denies chest pain, worsening shortness of breath or bleeding. HemoCue of 7.4 called and reported to [facility]. No new orders received. Okay to proceed with Feraheme and Retacrit today. Pt verbalized understanding to go to ED with worsening/new symptoms.

## 2018-01-22 ENCOUNTER — Ambulatory Visit (INDEPENDENT_AMBULATORY_CARE_PROVIDER_SITE_OTHER): Payer: PPO | Admitting: Vascular Surgery

## 2018-01-22 ENCOUNTER — Encounter: Payer: Self-pay | Admitting: Vascular Surgery

## 2018-01-22 VITALS — BP 156/73 | HR 80 | Temp 98.0°F | Resp 16 | Ht 74.0 in | Wt 315.0 lb

## 2018-01-22 DIAGNOSIS — I872 Venous insufficiency (chronic) (peripheral): Secondary | ICD-10-CM

## 2018-01-22 NOTE — Progress Notes (Signed)
     Laser Ablation Procedure    Date: 01/22/2018   Billal Rollo [facility] DOB:[birth year]  Consent signed: Yes    Surgeon:  Dr. Sherren Mocha Early  Procedure: Laser Ablation: left Greater Saphenous Vein  BP (!) 156/73   Pulse 80   Temp 98 F (36.7 C)   Resp 16   Ht 6\' 2"  (1.88 m)   Wt (!) 315 lb (142.9 kg)   SpO2 99%   BMI 40.44 kg/m   Tumescent Anesthesia: 450 cc 0.9% NaCl with 50 cc Lidocaine HCL with 1% Epi and 15 cc 8.4% NaHCO3  Local Anesthesia: 5 cc Lidocaine HCL and NaHCO3 (ratio 2:1)  15 watts continuous mode        Total energy: 2552   Total time: 2:50    Patient tolerated procedure well  Notes:   Description of Procedure:  After marking the course of the secondary varicosities, the patient was placed on the operating table in the supine position, and the left leg was prepped and draped in sterile fashion.   Local anesthetic was administered and under ultrasound guidance the saphenous vein was accessed with a micro needle and guide wire; then the mirco puncture sheath was placed.  A guide wire was inserted saphenofemoral junction , followed by a 5 french sheath.  The position of the sheath and then the laser fiber below the junction was confirmed using the ultrasound.  Tumescent anesthesia was administered along the course of the saphenous vein using ultrasound guidance. The patient was placed in Trendelenburg position and protective laser glasses were placed on patient and staff, and the laser was fired at 15 watts continuous mode advancing 1-16mm/second for a total of 2552 joules.     Steri strips were applied to the stab wounds and ABD pads and thigh high compression stockings were applied.  Ace wrap bandages were applied over the phlebectomy sites and at the top of the saphenofemoral junction. Blood loss was less than 15 cc.  The patient ambulated out of the operating room having tolerated the procedure well.

## 2018-01-22 NOTE — Progress Notes (Signed)
Patient name: Travis Blair MRN: [id number] DOB: 20-Oct-1950 Sex: male  REASON FOR VISIT:   For endovenous laser ablation left great saphenous vein  HPI:   Travis Blair is a pleasant 67 y.o. male who I last saw on 10-19.  He had previously been seen in July by 1 of my partners with chronic venous insufficiency.  He had reflux in the left great saphenous vein and also in the deep system involving the popliteal vein.  He had failed conservative treatment and presents now for endovenous laser ablation of the left great saphenous vein.  Noninvasive studies showed reflux in the left great saphenous vein which was significantly dilated.  I thought that it could be cannulated at about the level of the knee.  Current Outpatient Medications  Medication Sig Dispense Refill  . aspirin EC 81 MG tablet Take 81 mg by mouth daily.    Marland Kitchen atorvastatin (LIPITOR) 40 MG tablet Take 1 tablet (40 mg total) by mouth daily. 30 tablet 11  . ELIQUIS 5 MG TABS tablet TAKE 1 TABLET BY MOUTH TWICE A DAY 60 tablet 5  . glipiZIDE (GLUCOTROL XL) 10 MG 24 hr tablet TAKE 1 TABLET BY MOUTH EVERY DAY IN THE MORNING 90 tablet 0  . sildenafil (REVATIO) 20 MG tablet Take 5 tablets as needed for erectile dysfunction 50 tablet 3  . torsemide (DEMADEX) 20 MG tablet Take one tablet by mouth daily as directed. 10 tablet 0   No current facility-administered medications for this visit.     REVIEW OF SYSTEMS:  [X]  denotes positive finding, [ ]  denotes negative finding Vascular    Leg swelling    Cardiac    Chest pain or chest pressure:    Shortness of breath upon exertion:    Short of breath when lying flat:    Irregular heart rhythm:    Constitutional    Fever or chills:     PHYSICAL EXAM:   Vitals:   01/22/18 1353  BP: (!) 156/73  Pulse: 80  Resp: 16  Temp: 98 F (36.7 C)  SpO2: 99%  Weight: (!) 315 lb (142.9 kg)  Height: 6\' 2"  (1.88 m)    GENERAL: The patient is a well-nourished male, in no acute  distress. The vital signs are documented above.   DATA:   No new data  MEDICAL ISSUES:   LASER ABLATION LEFT GREAT SAPHENOUS VEIN: The patient was brought to the exam room.  I examined the left great saphenous vein with the SonoSite.  It appeared that the best site to cannulate the vein was about the level of the knee.  The left leg was prepped and draped in usual sterile fashion.  Under ultrasound guidance, after the skin was anesthetized, the great saphenous vein was cannulated with a micropuncture needle and a micropuncture sheath introduced over wire.  I then advanced the J-wire to just below the saphenofemoral junction.  The sheath was advanced over the wire and the wire and dilator were removed.  The laser fiber was then advanced through the sheath and positioned just below the saphenofemoral junction about 2 cm.  This was positioned below the takeoff of the superficial epigastric vein.  Next the skin was anesthetized and tumescent anesthesia administered along the entire course of the great saphenous vein up to the saphenofemoral junction.  The patient was placed in Trendelenburg.  Laser ablation was performed with a pullback rate of approximately 1 to 2 mm/s.  A total of 2552 J of  energy were used from the groin to the knee.  Sterile dressing was applied.  Pressure dressing was applied.  The patient will return in 1 week for follow-up duplex scan.   was transferre Travis Blair [facility] [phone]

## 2018-01-26 ENCOUNTER — Encounter (HOSPITAL_COMMUNITY): Payer: PPO

## 2018-01-27 ENCOUNTER — Ambulatory Visit (HOSPITAL_COMMUNITY)
Admission: RE | Admit: 2018-01-27 | Discharge: 2018-01-27 | Disposition: A | Discharge status: Home / Self Care | Payer: PPO | Source: Ambulatory Visit | Attending: Nephrology | Admitting: Nephrology

## 2018-01-27 VITALS — BP 171/77 | HR 79 | Temp 97.8°F | Resp 20

## 2018-01-27 DIAGNOSIS — N179 Acute kidney failure, unspecified: Secondary | ICD-10-CM | POA: Diagnosis not present

## 2018-01-27 LAB — POCT HEMOGLOBIN-HEMACUE: HEMOGLOBIN: 9.3 g/dL — AB (ref 13.0–17.0)

## 2018-01-27 MED ORDER — EPOETIN ALFA-EPBX 10000 UNIT/ML IJ SOLN
10000.0000 [IU] | INTRAMUSCULAR | Status: DC
  Administered 2018-01-27: 10000 [IU] via SUBCUTANEOUS
  Filled 2018-01-27: qty 1

## 2018-01-29 ENCOUNTER — Ambulatory Visit (INDEPENDENT_AMBULATORY_CARE_PROVIDER_SITE_OTHER): Payer: PPO | Admitting: Vascular Surgery

## 2018-01-29 ENCOUNTER — Ambulatory Visit (HOSPITAL_COMMUNITY)
Admission: RE | Admit: 2018-01-29 | Discharge: 2018-01-29 | Disposition: A | Discharge status: Home / Self Care | Payer: PPO | Source: Ambulatory Visit | Attending: Vascular Surgery | Admitting: Vascular Surgery

## 2018-01-29 ENCOUNTER — Encounter: Payer: Self-pay | Admitting: Vascular Surgery

## 2018-01-29 ENCOUNTER — Ambulatory Visit: Payer: PPO | Admitting: Podiatry

## 2018-01-29 VITALS — BP 156/72 | HR 73 | Temp 97.6°F | Resp 16 | Ht 74.0 in | Wt 310.0 lb

## 2018-01-29 DIAGNOSIS — E1151 Type 2 diabetes mellitus with diabetic peripheral angiopathy without gangrene: Secondary | ICD-10-CM | POA: Diagnosis not present

## 2018-01-29 DIAGNOSIS — I872 Venous insufficiency (chronic) (peripheral): Secondary | ICD-10-CM

## 2018-01-29 DIAGNOSIS — B351 Tinea unguium: Secondary | ICD-10-CM | POA: Diagnosis not present

## 2018-01-29 DIAGNOSIS — E1169 Type 2 diabetes mellitus with other specified complication: Secondary | ICD-10-CM | POA: Diagnosis not present

## 2018-01-29 DIAGNOSIS — Z48812 Encounter for surgical aftercare following surgery on the circulatory system: Secondary | ICD-10-CM

## 2018-01-29 NOTE — Progress Notes (Signed)
Patient name: Travis Blair MRN: [id number] DOB: 09/22/1950 Sex: male  REASON FOR VISIT:   Follow-up after endovenous laser ablation of the left great saphenous vein  HPI:   Travis Blair is a pleasant 67 y.o. male who returns for follow-up after endovenous laser ablation of the left great saphenous vein on 01/22/2018.  The patient has a history of chronic venous insufficiency and had significant reflux in the left great saphenous vein and also some deep venous reflux.  He failed conservative treatment including thigh-high compression stockings, leg elevation, and ibuprofen.  He was felt to be a good candidate for laser ablation of the left great saphenous vein.  He underwent laser ablation on 01/22/2018.  The great saphenous vein was ablated from just above the knee to the groin.  A total of 2552 J of energy were used.  Patient returns for a follow-up visit.  The patient states he really did not have any significant pain and has not been taking ibuprofen.  He has been wearing his compression stockings.  He denies significant swelling in the left leg.  He has gradually resume his normal activities.  Current Outpatient Medications  Medication Sig Dispense Refill  . aspirin EC 81 MG tablet Take 81 mg by mouth daily.    Marland Kitchen atorvastatin (LIPITOR) 40 MG tablet Take 1 tablet (40 mg total) by mouth daily. 30 tablet 11  . ELIQUIS 5 MG TABS tablet TAKE 1 TABLET BY MOUTH TWICE A DAY 60 tablet 5  . glipiZIDE (GLUCOTROL XL) 10 MG 24 hr tablet TAKE 1 TABLET BY MOUTH EVERY DAY IN THE MORNING 90 tablet 0  . sildenafil (REVATIO) 20 MG tablet Take 5 tablets as needed for erectile dysfunction 50 tablet 3  . torsemide (DEMADEX) 20 MG tablet Take one tablet by mouth daily as directed. 10 tablet 0   No current facility-administered medications for this visit.     REVIEW OF SYSTEMS:  [X]  denotes positive finding, [ ]  denotes negative finding Vascular    Leg swelling    Cardiac    Chest pain or  chest pressure:    Shortness of breath upon exertion:    Short of breath when lying flat:    Irregular heart rhythm:    Constitutional    Fever or chills:     PHYSICAL EXAM:   Vitals:   01/29/18 1229  BP: (!) 156/72  Pulse: 73  Resp: 16  Temp: 97.6 F (36.4 C)  SpO2: 99%  Weight: (!) 310 lb (140.6 kg)  Height: 6\' 2"  (1.88 m)   GENERAL: The patient is a well-nourished male, in no acute distress. The vital signs are documented above. CARDIOVASCULAR: There is a regular rate and rhythm. PULMONARY: There is good air exchange bilaterally without wheezing or rales. VENOUS: The patient has no significant bruising in the medial left thigh.  He has mild bilateral lower extremity swelling.  DATA:   VENOUS DUPLEX: I have independently interpreted his venous duplex scan today.  The left great saphenous vein was successfully closed from just above the knee to 2.08 cm from the saphenofemoral junction.  There is no evidence of DVT.  MEDICAL ISSUES:   STATUS POST ENDOVENOUS LASER ABLATION OF THE LEFT GREAT SAPHENOUS VEIN: Patient is doing well status post laser ablation of the left great saphenous vein.  He has no symptoms and had no significant pain or bruising.  Duplex shows successful closure of the vein with no evidence of DVT.  I have encouraged  him to stay as active as possible and walk as much as possible.  We also discussed the importance of intermittent leg elevation and the proper positioning for this.  He will wear his thigh-high compression stockings for another week.  At that point it may be more practical to wear knee-high stockings.  I will see him back as needed.  [name] [facility] [phone]

## 2018-02-03 ENCOUNTER — Ambulatory Visit (HOSPITAL_COMMUNITY)
Admission: RE | Admit: 2018-02-03 | Discharge: 2018-02-03 | Disposition: A | Discharge status: Home / Self Care | Payer: PPO | Source: Ambulatory Visit | Attending: Nephrology | Admitting: Nephrology

## 2018-02-03 VITALS — BP 177/66 | HR 83 | Temp 97.6°F | Resp 83

## 2018-02-03 DIAGNOSIS — N179 Acute kidney failure, unspecified: Secondary | ICD-10-CM | POA: Diagnosis not present

## 2018-02-03 MED ORDER — EPOETIN ALFA-EPBX 10000 UNIT/ML IJ SOLN
10000.0000 [IU] | INTRAMUSCULAR | Status: DC
  Administered 2018-02-03: 10000 [IU] via SUBCUTANEOUS
  Filled 2018-02-03: qty 1

## 2018-02-06 LAB — POCT HEMOGLOBIN-HEMACUE: Hemoglobin: 9.8 g/dL — ABNORMAL LOW (ref 13.0–17.0)

## 2018-02-10 ENCOUNTER — Ambulatory Visit (HOSPITAL_COMMUNITY)
Admission: RE | Admit: 2018-02-10 | Discharge: 2018-02-10 | Disposition: A | Discharge status: Home / Self Care | Payer: PPO | Source: Ambulatory Visit | Attending: Nephrology | Admitting: Nephrology

## 2018-02-10 VITALS — BP 150/79 | HR 78 | Resp 18

## 2018-02-10 DIAGNOSIS — N179 Acute kidney failure, unspecified: Secondary | ICD-10-CM | POA: Diagnosis not present

## 2018-02-10 LAB — POCT HEMOGLOBIN-HEMACUE: Hemoglobin: 10 g/dL — ABNORMAL LOW (ref 13.0–17.0)

## 2018-02-10 MED ORDER — EPOETIN ALFA-EPBX 10000 UNIT/ML IJ SOLN
10000.0000 [IU] | INTRAMUSCULAR | Status: DC
  Administered 2018-02-10: 10000 [IU] via SUBCUTANEOUS
  Filled 2018-02-10: qty 1

## 2018-02-17 ENCOUNTER — Ambulatory Visit (HOSPITAL_COMMUNITY)
Admission: RE | Admit: 2018-02-17 | Discharge: 2018-02-17 | Disposition: A | Discharge status: Home / Self Care | Payer: PPO | Source: Ambulatory Visit | Attending: Nephrology | Admitting: Nephrology

## 2018-02-17 VITALS — BP 153/76 | HR 73 | Temp 97.6°F

## 2018-02-17 DIAGNOSIS — N179 Acute kidney failure, unspecified: Secondary | ICD-10-CM | POA: Diagnosis not present

## 2018-02-17 LAB — IRON AND TIBC
Iron: 29 ug/dL — ABNORMAL LOW (ref 45–182)
Saturation Ratios: 8 % — ABNORMAL LOW (ref 17.9–39.5)
TIBC: 381 ug/dL (ref 250–450)
UIBC: 352 ug/dL

## 2018-02-17 LAB — POCT HEMOGLOBIN-HEMACUE: Hemoglobin: 10.8 g/dL — ABNORMAL LOW (ref 13.0–17.0)

## 2018-02-17 LAB — FERRITIN: Ferritin: 30 ng/mL (ref 24–336)

## 2018-02-17 MED ORDER — EPOETIN ALFA-EPBX 10000 UNIT/ML IJ SOLN
10000.0000 [IU] | INTRAMUSCULAR | Status: DC
  Administered 2018-02-17: 10000 [IU] via SUBCUTANEOUS
  Filled 2018-02-17: qty 1

## 2018-02-19 DIAGNOSIS — D6489 Other specified anemias: Secondary | ICD-10-CM | POA: Diagnosis not present

## 2018-02-19 DIAGNOSIS — I5032 Chronic diastolic (congestive) heart failure: Secondary | ICD-10-CM | POA: Diagnosis not present

## 2018-02-19 DIAGNOSIS — I2510 Atherosclerotic heart disease of native coronary artery without angina pectoris: Secondary | ICD-10-CM | POA: Diagnosis not present

## 2018-02-19 DIAGNOSIS — N183 Chronic kidney disease, stage 3 (moderate): Secondary | ICD-10-CM | POA: Diagnosis not present

## 2018-02-19 DIAGNOSIS — K5909 Other constipation: Secondary | ICD-10-CM | POA: Diagnosis not present

## 2018-02-19 DIAGNOSIS — E1129 Type 2 diabetes mellitus with other diabetic kidney complication: Secondary | ICD-10-CM | POA: Diagnosis not present

## 2018-02-19 DIAGNOSIS — Z6841 Body Mass Index (BMI) 40.0 and over, adult: Secondary | ICD-10-CM | POA: Diagnosis not present

## 2018-02-19 DIAGNOSIS — I10 Essential (primary) hypertension: Secondary | ICD-10-CM | POA: Diagnosis not present

## 2018-02-23 ENCOUNTER — Other Ambulatory Visit: Payer: PPO | Admitting: *Deleted

## 2018-02-23 ENCOUNTER — Other Ambulatory Visit (HOSPITAL_COMMUNITY): Payer: Self-pay | Admitting: *Deleted

## 2018-02-23 DIAGNOSIS — I2510 Atherosclerotic heart disease of native coronary artery without angina pectoris: Secondary | ICD-10-CM

## 2018-02-23 DIAGNOSIS — E782 Mixed hyperlipidemia: Secondary | ICD-10-CM | POA: Diagnosis not present

## 2018-02-23 DIAGNOSIS — I5032 Chronic diastolic (congestive) heart failure: Secondary | ICD-10-CM

## 2018-02-23 LAB — LIPID PANEL
Chol/HDL Ratio: 5.9 ratio — ABNORMAL HIGH (ref 0.0–5.0)
Cholesterol, Total: 217 mg/dL — ABNORMAL HIGH (ref 100–199)
HDL: 37 mg/dL — ABNORMAL LOW (ref >39)
LDL CALC: 145 mg/dL — AB (ref 0–99)
Triglycerides: 174 mg/dL — ABNORMAL HIGH (ref 0–149)
VLDL Cholesterol Cal: 35 mg/dL (ref 5–40)

## 2018-02-23 LAB — HEPATIC FUNCTION PANEL
ALK PHOS: 66 IU/L (ref 39–117)
ALT: 13 IU/L (ref 0–44)
AST: 16 IU/L (ref 0–40)
Albumin: 3.8 g/dL (ref 3.6–4.8)
Bilirubin Total: <0.2 mg/dL (ref 0.0–1.2)
Bilirubin, Direct: 0.07 mg/dL (ref 0.00–0.40)
Total Protein: 6.3 g/dL (ref 6.0–8.5)

## 2018-02-24 ENCOUNTER — Telehealth: Payer: Self-pay | Admitting: Pharmacist

## 2018-02-24 ENCOUNTER — Ambulatory Visit (HOSPITAL_COMMUNITY)
Admission: RE | Admit: 2018-02-24 | Discharge: 2018-02-24 | Disposition: A | Discharge status: Home / Self Care | Payer: PPO | Source: Ambulatory Visit | Attending: Nephrology | Admitting: Nephrology

## 2018-02-24 VITALS — BP 144/74 | HR 68 | Temp 98.6°F | Resp 20 | Ht 74.0 in | Wt 310.0 lb

## 2018-02-24 DIAGNOSIS — N179 Acute kidney failure, unspecified: Secondary | ICD-10-CM | POA: Insufficient documentation

## 2018-02-24 DIAGNOSIS — E782 Mixed hyperlipidemia: Secondary | ICD-10-CM

## 2018-02-24 LAB — POCT HEMOGLOBIN-HEMACUE: Hemoglobin: 10.6 g/dL — ABNORMAL LOW (ref 13.0–17.0)

## 2018-02-24 MED ORDER — ATORVASTATIN CALCIUM 20 MG PO TABS: 20.0000 mg | ORAL_TABLET | Freq: Every day | ORAL | 11 refills | Status: DC

## 2018-02-24 MED ORDER — SODIUM CHLORIDE 0.9 % IV SOLN
510.0000 mg | INTRAVENOUS | Status: DC
  Administered 2018-02-24: 510 mg via INTRAVENOUS
  Filled 2018-02-24: qty 510

## 2018-02-24 MED ORDER — EPOETIN ALFA-EPBX 10000 UNIT/ML IJ SOLN
10000.0000 [IU] | INTRAMUSCULAR | Status: DC
  Administered 2018-02-24: 10000 [IU] via SUBCUTANEOUS
  Filled 2018-02-24: qty 1

## 2018-02-24 NOTE — Telephone Encounter (Signed)
Pt states he stopped taking his atorvastatin 1 month ago because it was causing dizziness. He did not report dizziness on the lower 20mg  dose. He is already intolerant to rosuvastatin. Will resume atorvastatin 20mg  daily and recheck lipids in 2-3 months. (Pt was previously taking 20mg  on last lipid panel, however had run out a few weeks before his labs were drawn). Will plan to add Zetia at that time if additional lipid lowering therapy is needed.

## 2018-03-03 ENCOUNTER — Ambulatory Visit (HOSPITAL_COMMUNITY)
Admission: RE | Admit: 2018-03-03 | Discharge: 2018-03-03 | Disposition: A | Discharge status: Home / Self Care | Payer: PPO | Source: Ambulatory Visit | Attending: Nephrology | Admitting: Nephrology

## 2018-03-03 VITALS — BP 171/77 | HR 65 | Temp 98.6°F | Resp 20

## 2018-03-03 DIAGNOSIS — N179 Acute kidney failure, unspecified: Secondary | ICD-10-CM | POA: Diagnosis not present

## 2018-03-03 LAB — POCT HEMOGLOBIN-HEMACUE: Hemoglobin: 10.8 g/dL — ABNORMAL LOW (ref 13.0–17.0)

## 2018-03-03 MED ORDER — SODIUM CHLORIDE 0.9 % IV SOLN
510.0000 mg | INTRAVENOUS | Status: DC
  Administered 2018-03-03: 510 mg via INTRAVENOUS
  Filled 2018-03-03: qty 510

## 2018-03-03 MED ORDER — EPOETIN ALFA-EPBX 10000 UNIT/ML IJ SOLN
10000.0000 [IU] | INTRAMUSCULAR | Status: DC
  Administered 2018-03-03: 10000 [IU] via SUBCUTANEOUS
  Filled 2018-03-03: qty 1

## 2018-03-08 NOTE — Progress Notes (Signed)
  Subjective:  Patient ID: Travis Blair, male    DOB: 17-May-1950,  MRN: [id number]  Chief Complaint  Patient presents with  . Foot Ulcer    2 month ulcer check  . Nail Problem    nail trim today    68 y.o. male presents with the above complaint.  Request care of his nails today no concerns about the area that was previously ulcerated  Review of Systems: Negative except as noted in the HPI. Denies N/V/F/Ch.  Past Medical History:  Diagnosis Date  . Anemia associated with chronic renal failure basically  . Asthma    as a child  . Chronic kidney disease    Sees Dr [name]  . Coronary artery disease   . Diabetes mellitus without complication ([place])   . DVT (deep venous thrombosis) ([place])   . Hyperlipidemia   . Hypertension   . Myocardial infarction ([place])   . Obesity   . Pneumonia 2018  . Sleep apnea     Current Outpatient Medications:  .  aspirin EC 81 MG tablet, Take 81 mg by mouth daily., Disp: , Rfl:  .  atorvastatin (LIPITOR) 20 MG tablet, Take 1 tablet (20 mg total) by mouth daily., Disp: 30 tablet, Rfl: 11 .  ELIQUIS 5 MG TABS tablet, TAKE 1 TABLET BY MOUTH TWICE A DAY, Disp: 60 tablet, Rfl: 5 .  glipiZIDE (GLUCOTROL XL) 10 MG 24 hr tablet, TAKE 1 TABLET BY MOUTH EVERY DAY IN THE MORNING, Disp: 90 tablet, Rfl: 0 .  sildenafil (REVATIO) 20 MG tablet, Take 5 tablets as needed for erectile dysfunction, Disp: 50 tablet, Rfl: 3 .  torsemide (DEMADEX) 20 MG tablet, Take one tablet by mouth daily as directed., Disp: 10 tablet, Rfl: 0  Social History   Tobacco Use  Smoking Status Never Smoker  Smokeless Tobacco Never Used    No Known Allergies Objective:  There were no vitals filed for this visit. There is no height or weight on file to calculate BMI. Constitutional Well developed. Well nourished.  Vascular Dorsalis pedis pulses palpable bilaterally. Posterior tibial pulses non-palpable bilaterally. Capillary refill normal to all digits.  No cyanosis or clubbing  noted. Pedal hair growth normal.  Neurologic Normal speech. Oriented to person, place, and time. Epicritic sensation to light touch grossly present bilaterally. Protective sensation absent bilat.  Dermatologic Nails elongated and thickened. No skin lesions. Ulceration healed R 5th MPJ with epithelialization.  Orthopedic: Normal joint ROM without pain or crepitus bilaterally. No visible deformities. No bony tenderness.   Radiographs: Taken and reviewed. No underlying osseous erosion. No acute fracture. Assessment:   1. Onychomycosis of multiple toenails with type 2 diabetes mellitus and peripheral angiopathy ([place])    Plan:  Patient was evaluated and treated and all questions answered.   Diabetes with PAD, Onychomycosis -Educated on diabetic footcare. Diabetic risk level 1 -Nails x10 debrided sharply and manually with large nail nipper and rotary burr.   Procedure: Nail Debridement Rationale: Patient meets criteria for routine foot care due to PAD Type of Debridement: manual, sharp debridement. Instrumentation: Nail nipper, rotary burr. Number of Nails: 10   Return in about 3 months (around 04/30/2018) for Diabetic Foot Care.

## 2018-03-10 ENCOUNTER — Ambulatory Visit (HOSPITAL_COMMUNITY)
Admission: RE | Admit: 2018-03-10 | Discharge: 2018-03-10 | Disposition: A | Discharge status: Home / Self Care | Payer: PPO | Source: Ambulatory Visit | Attending: Nephrology | Admitting: Nephrology

## 2018-03-10 VITALS — BP 170/83 | HR 71 | Temp 97.6°F | Resp 20

## 2018-03-10 DIAGNOSIS — N179 Acute kidney failure, unspecified: Secondary | ICD-10-CM | POA: Diagnosis not present

## 2018-03-10 LAB — POCT HEMOGLOBIN-HEMACUE: Hemoglobin: 12.2 g/dL — ABNORMAL LOW (ref 13.0–17.0)

## 2018-03-10 MED ORDER — EPOETIN ALFA-EPBX 10000 UNIT/ML IJ SOLN: 10000.0000 [IU] | INTRAMUSCULAR | Status: DC

## 2018-03-17 ENCOUNTER — Encounter (HOSPITAL_COMMUNITY): Payer: PPO

## 2018-03-24 ENCOUNTER — Ambulatory Visit (HOSPITAL_COMMUNITY)
Admission: RE | Admit: 2018-03-24 | Discharge: 2018-03-24 | Disposition: A | Discharge status: Home / Self Care | Payer: PPO | Source: Ambulatory Visit | Attending: Nephrology | Admitting: Nephrology

## 2018-03-24 VITALS — BP 163/78 | HR 75 | Temp 98.4°F | Resp 20

## 2018-03-24 DIAGNOSIS — N179 Acute kidney failure, unspecified: Secondary | ICD-10-CM

## 2018-03-24 LAB — IRON AND TIBC
Iron: 55 ug/dL (ref 45–182)
Saturation Ratios: 17 % — ABNORMAL LOW (ref 17.9–39.5)
TIBC: 321 ug/dL (ref 250–450)
UIBC: 266 ug/dL

## 2018-03-24 LAB — POCT HEMOGLOBIN-HEMACUE: Hemoglobin: 12.5 g/dL — ABNORMAL LOW (ref 13.0–17.0)

## 2018-03-24 LAB — FERRITIN: Ferritin: 84 ng/mL (ref 24–336)

## 2018-03-24 MED ORDER — EPOETIN ALFA-EPBX 10000 UNIT/ML IJ SOLN
10000.0000 [IU] | INTRAMUSCULAR | Status: DC
  Filled 2018-03-24: qty 1

## 2018-03-31 ENCOUNTER — Encounter (HOSPITAL_COMMUNITY): Payer: PPO

## 2018-04-07 ENCOUNTER — Ambulatory Visit (HOSPITAL_COMMUNITY)
Admission: RE | Admit: 2018-04-07 | Discharge: 2018-04-07 | Disposition: A | Discharge status: Home / Self Care | Payer: PPO | Source: Ambulatory Visit | Attending: Nephrology | Admitting: Nephrology

## 2018-04-07 VITALS — BP 170/84 | HR 73 | Resp 20

## 2018-04-07 DIAGNOSIS — N179 Acute kidney failure, unspecified: Secondary | ICD-10-CM | POA: Diagnosis not present

## 2018-04-07 LAB — POCT HEMOGLOBIN-HEMACUE: Hemoglobin: 12.8 g/dL — ABNORMAL LOW (ref 13.0–17.0)

## 2018-04-07 MED ORDER — EPOETIN ALFA-EPBX 10000 UNIT/ML IJ SOLN
10000.0000 [IU] | INTRAMUSCULAR | Status: DC
  Filled 2018-04-07: qty 1

## 2018-04-09 ENCOUNTER — Ambulatory Visit: Payer: PPO | Admitting: Podiatry

## 2018-04-09 ENCOUNTER — Encounter: Payer: Self-pay | Admitting: Podiatry

## 2018-04-09 DIAGNOSIS — E1151 Type 2 diabetes mellitus with diabetic peripheral angiopathy without gangrene: Secondary | ICD-10-CM | POA: Diagnosis not present

## 2018-04-09 DIAGNOSIS — M2041 Other hammer toe(s) (acquired), right foot: Secondary | ICD-10-CM

## 2018-04-09 DIAGNOSIS — M21961 Unspecified acquired deformity of right lower leg: Secondary | ICD-10-CM | POA: Diagnosis not present

## 2018-04-09 DIAGNOSIS — M7751 Other enthesopathy of right foot: Secondary | ICD-10-CM

## 2018-04-09 DIAGNOSIS — Q828 Other specified congenital malformations of skin: Secondary | ICD-10-CM

## 2018-04-09 DIAGNOSIS — E1169 Type 2 diabetes mellitus with other specified complication: Secondary | ICD-10-CM

## 2018-04-09 DIAGNOSIS — B351 Tinea unguium: Secondary | ICD-10-CM | POA: Diagnosis not present

## 2018-04-09 NOTE — Progress Notes (Signed)
Subjective:  Patient ID: Travis Blair, male    DOB: 12-11-1950,  MRN: [id number]  Chief Complaint  Patient presents with  . Debridement    Trim toenails    68 y.o. male presents with the above complaint.  Here for routine foot care.  Denies new known issues.  Review of Systems: Negative except as noted in the HPI. Denies N/V/F/Ch.  Past Medical History:  Diagnosis Date  . Anemia associated with chronic renal failure basically  . Asthma    as a child  . Chronic kidney disease    Sees Dr [name]  . Coronary artery disease   . Diabetes mellitus without complication ([place])   . DVT (deep venous thrombosis) ([place])   . Hyperlipidemia   . Hypertension   . Myocardial infarction ([place])   . Obesity   . Pneumonia 2018  . Sleep apnea     Current Outpatient Medications:  .  aspirin EC 81 MG tablet, Take 81 mg by mouth daily., Disp: , Rfl:  .  atorvastatin (LIPITOR) 20 MG tablet, Take 1 tablet (20 mg total) by mouth daily., Disp: 30 tablet, Rfl: 11 .  ELIQUIS 5 MG TABS tablet, TAKE 1 TABLET BY MOUTH TWICE A DAY, Disp: 60 tablet, Rfl: 5 .  glipiZIDE (GLUCOTROL XL) 10 MG 24 hr tablet, TAKE 1 TABLET BY MOUTH EVERY DAY IN THE MORNING, Disp: 90 tablet, Rfl: 0 .  sildenafil (REVATIO) 20 MG tablet, Take 5 tablets as needed for erectile dysfunction, Disp: 50 tablet, Rfl: 3 .  torsemide (DEMADEX) 20 MG tablet, Take one tablet by mouth daily as directed., Disp: 10 tablet, Rfl: 0  Social History   Tobacco Use  Smoking Status Never Smoker  Smokeless Tobacco Never Used    No Known Allergies Objective:  There were no vitals filed for this visit. There is no height or weight on file to calculate BMI. Constitutional Well developed. Well nourished.  Vascular Dorsalis pedis pulses palpable bilaterally. Posterior tibial pulses non-palpable bilaterally. Capillary refill normal to all digits.  No cyanosis or clubbing noted. Pedal hair growth normal.  Neurologic Normal speech. Oriented to  person, place, and time. Epicritic sensation to light touch grossly present bilaterally. Protective sensation absent bilat.  Dermatologic Nails elongated and thickened. No skin lesions. Hyperkeratotic lesion right sub-met 5 Skin irritation dorsal second PIPJ  Orthopedic: Normal joint ROM without pain or crepitus bilaterally. Rigid right second hammertoe deformity No bony tenderness.   Radiographs: Taken and reviewed. No underlying osseous erosion. No acute fracture. Assessment:   1. Onychomycosis of multiple toenails with type 2 diabetes mellitus and peripheral angiopathy ([place])   2. Deformity of metatarsal bone of right foot   3. Porokeratosis   4. Diabetes mellitus type 2 with peripheral artery disease ([place])   5. Capsulitis of metatarsophalangeal (MTP) joint of right foot   6. Hammertoe of right foot    Plan:  Patient was evaluated and treated and all questions answered.   Diabetes with PAD, Onychomycosis -Educated on diabetic footcare. Diabetic risk level 1 -Routine foot care as below:  Procedure: Nail Debridement Rationale: Patient meets criteria for routine foot care due to PAD Type of Debridement: manual, sharp debridement. Instrumentation: Nail nipper, rotary burr. Number of Nails: 10  Procedure: Paring of Lesion Rationale: painful hyperkeratotic lesion Type of Debridement: manual, sharp debridement. Instrumentation: 15 blade Number of Lesions: 1  Right foot prominent fifth metatarsal head, capsulitis, right second hammertoe -Discussed the patient that due to the history of ulceration at the  fifth metatarsal head right think he would benefit from surgical excision of the metatarsal head to prevent callus and prominence.  Patient amenable.  Would also consider second toe hammertoe correction to prevent ulceration as he is getting dorsal irritation at the digit and I am concerned this area will ulcerate. -Patient has failed all conservative therapy and wishes to proceed  with surgical intervention. All risks, benefits, and alternatives discussed with patient. No guarantees given. Consent reviewed and signed by patient. -Planned procedures: Right second hammertoe correction, fifth metatarsal head excision   No follow-ups on file.

## 2018-04-14 ENCOUNTER — Encounter (HOSPITAL_COMMUNITY): Payer: PPO

## 2018-04-14 DIAGNOSIS — E782 Mixed hyperlipidemia: Secondary | ICD-10-CM | POA: Diagnosis not present

## 2018-04-14 DIAGNOSIS — R030 Elevated blood-pressure reading, without diagnosis of hypertension: Secondary | ICD-10-CM | POA: Diagnosis not present

## 2018-04-14 DIAGNOSIS — I2510 Atherosclerotic heart disease of native coronary artery without angina pectoris: Secondary | ICD-10-CM | POA: Diagnosis not present

## 2018-04-14 DIAGNOSIS — E1169 Type 2 diabetes mellitus with other specified complication: Secondary | ICD-10-CM | POA: Diagnosis not present

## 2018-04-28 ENCOUNTER — Encounter (HOSPITAL_COMMUNITY): Payer: PPO

## 2018-04-30 ENCOUNTER — Ambulatory Visit: Payer: PPO | Admitting: Podiatry

## 2018-05-05 ENCOUNTER — Encounter (HOSPITAL_COMMUNITY): Payer: PPO

## 2018-05-05 ENCOUNTER — Inpatient Hospital Stay (HOSPITAL_COMMUNITY): Admission: RE | Admit: 2018-05-05 | Payer: PPO | Source: Ambulatory Visit

## 2018-05-11 ENCOUNTER — Other Ambulatory Visit: Payer: Self-pay

## 2018-05-12 ENCOUNTER — Inpatient Hospital Stay (HOSPITAL_COMMUNITY): Admission: RE | Admit: 2018-05-12 | Payer: PPO | Source: Ambulatory Visit

## 2018-05-18 ENCOUNTER — Other Ambulatory Visit: Payer: PPO

## 2018-05-20 DIAGNOSIS — E559 Vitamin D deficiency, unspecified: Secondary | ICD-10-CM | POA: Diagnosis not present

## 2018-05-20 DIAGNOSIS — E539 Vitamin B deficiency, unspecified: Secondary | ICD-10-CM | POA: Diagnosis not present

## 2018-05-20 DIAGNOSIS — E59 Dietary selenium deficiency: Secondary | ICD-10-CM | POA: Diagnosis not present

## 2018-05-20 DIAGNOSIS — E2839 Other primary ovarian failure: Secondary | ICD-10-CM | POA: Diagnosis not present

## 2018-05-20 DIAGNOSIS — Z0000 Encounter for general adult medical examination without abnormal findings: Secondary | ICD-10-CM | POA: Diagnosis not present

## 2018-05-20 DIAGNOSIS — E038 Other specified hypothyroidism: Secondary | ICD-10-CM | POA: Diagnosis not present

## 2018-06-08 ENCOUNTER — Telehealth: Payer: Self-pay | Admitting: Cardiovascular Disease

## 2018-06-08 DIAGNOSIS — Z951 Presence of aortocoronary bypass graft: Secondary | ICD-10-CM

## 2018-06-08 DIAGNOSIS — I5032 Chronic diastolic (congestive) heart failure: Secondary | ICD-10-CM

## 2018-06-08 DIAGNOSIS — E782 Mixed hyperlipidemia: Secondary | ICD-10-CM

## 2018-06-08 NOTE — Telephone Encounter (Signed)
[facility] Torsemide 20 mg tablet. Would Dr. Acie Fredrickson like to refill this medication? Please address

## 2018-06-08 NOTE — Telephone Encounter (Signed)
Left message for patient to call back regarding refill to determine time for patient to f/u with Dr. Acie Fredrickson and get bmet

## 2018-06-23 ENCOUNTER — Telehealth: Payer: Self-pay

## 2018-06-23 NOTE — Telephone Encounter (Signed)
Called pt to set up possible evisit with VB 06/25/2018. Pt was not available at that time. Pt would like to be rescheduled after that date.

## 2018-06-29 ENCOUNTER — Other Ambulatory Visit: Payer: Self-pay | Admitting: Cardiovascular Disease

## 2018-06-29 ENCOUNTER — Encounter: Payer: Self-pay | Admitting: Cardiovascular Disease

## 2018-06-29 MED ORDER — TORSEMIDE 20 MG PO TABS: ORAL_TABLET | ORAL | 3 refills | Status: DC

## 2018-06-29 NOTE — Telephone Encounter (Signed)
Outpatient Medication Detail    Disp Refills Start End   torsemide (DEMADEX) 20 MG tablet 90 tablet 3 06/29/2018    Sig: Take one tablet by mouth daily as directed.   Sent to pharmacy as: torsemide (DEMADEX) 20 MG tablet   E-Prescribing Status: Receipt confirmed by pharmacy (06/29/2018 3:04 PM EDT)   Pharmacy   [id number] [place], [place] - [address]

## 2018-06-29 NOTE — Telephone Encounter (Signed)
error 

## 2018-06-29 NOTE — Telephone Encounter (Signed)
Pt calling back requesting a refill on his torsemide, [facility] [address]. Pt would like a call back at [phone]. Please address

## 2018-06-29 NOTE — Telephone Encounter (Signed)
Patient returned your call.

## 2018-06-29 NOTE — Telephone Encounter (Signed)
Spoke with patient who reports he has been taking torsemide 20 mg daily but ran out 1 1/2 weeks ago. I left him a message 2 weeks ago because he needs lab work to evaluate kidney function and electrolytes since he is taking torsemide daily. He is also due for repeat lipid panel. I scheduled him for lab appointment tomorrow (5/19). He states he will pick up torsemide Rx today and will await our call back after the lab work tomorrow regarding whether he needs K+ supplement. He thanked me for the call.

## 2018-06-30 ENCOUNTER — Other Ambulatory Visit: Payer: Self-pay

## 2018-06-30 ENCOUNTER — Other Ambulatory Visit: Payer: PPO

## 2018-06-30 DIAGNOSIS — Z951 Presence of aortocoronary bypass graft: Secondary | ICD-10-CM | POA: Diagnosis not present

## 2018-06-30 DIAGNOSIS — I2510 Atherosclerotic heart disease of native coronary artery without angina pectoris: Secondary | ICD-10-CM | POA: Diagnosis not present

## 2018-06-30 DIAGNOSIS — I5032 Chronic diastolic (congestive) heart failure: Secondary | ICD-10-CM | POA: Diagnosis not present

## 2018-06-30 DIAGNOSIS — E782 Mixed hyperlipidemia: Secondary | ICD-10-CM | POA: Diagnosis not present

## 2018-06-30 LAB — HEPATIC FUNCTION PANEL
ALT: 18 IU/L (ref 0–44)
AST: 20 IU/L (ref 0–40)
Albumin: 3.6 g/dL — ABNORMAL LOW (ref 3.8–4.8)
Alkaline Phosphatase: 68 IU/L (ref 39–117)
Bilirubin Total: 0.3 mg/dL (ref 0.0–1.2)
Bilirubin, Direct: 0.09 mg/dL (ref 0.00–0.40)
Total Protein: 6.2 g/dL (ref 6.0–8.5)

## 2018-06-30 LAB — LIPID PANEL
Chol/HDL Ratio: 6.5 ratio — ABNORMAL HIGH (ref 0.0–5.0)
Cholesterol, Total: 247 mg/dL — ABNORMAL HIGH (ref 100–199)
HDL: 38 mg/dL — ABNORMAL LOW (ref >39)
Triglycerides: 422 mg/dL — ABNORMAL HIGH (ref 0–149)

## 2018-06-30 LAB — BASIC METABOLIC PANEL
BUN/Creatinine Ratio: 20 (ref 10–24)
BUN: 38 mg/dL — ABNORMAL HIGH (ref 8–27)
CO2: 19 mmol/L — ABNORMAL LOW (ref 20–29)
Calcium: 9 mg/dL (ref 8.6–10.2)
Chloride: 102 mmol/L (ref 96–106)
Creatinine, Ser: 1.94 mg/dL — ABNORMAL HIGH (ref 0.76–1.27)
GFR calc Af Amer: 41 mL/min/{1.73_m2} — ABNORMAL LOW (ref >59)
GFR calc non Af Amer: 35 mL/min/{1.73_m2} — ABNORMAL LOW (ref >59)
Glucose: 231 mg/dL — ABNORMAL HIGH (ref 65–99)
Potassium: 5.4 mmol/L — ABNORMAL HIGH (ref 3.5–5.2)
Sodium: 136 mmol/L (ref 134–144)

## 2018-07-01 ENCOUNTER — Telehealth: Payer: Self-pay

## 2018-07-01 NOTE — Telephone Encounter (Signed)
LMTCB

## 2018-07-01 NOTE — Telephone Encounter (Signed)
-----   Message from Thayer Headings, MD sent at 07/01/2018 10:32 AM EDT ----- Many of his labs are now in the abnormal range. He has been eating lots of fried foods and take out foods.   Eating lots of strawberries. Has not been eating much protein.  Has been taking lots of ibuprofen due to arthritis pain . Ive recommended that he greatly reduce his ibuprofen intake  He has already cut his strawberry intake ( end of season) Agrees to reduce fried foods and take out foods Agrees to increase exercise  Will need a repeat lipid profile and BMP in 3-4 weeks

## 2018-07-07 NOTE — Telephone Encounter (Signed)
Left message for patient to call back to schedule lab appointment for mid June or to schedule the lab appointment when he talks to [employer], [place] on 6/10.

## 2018-07-09 ENCOUNTER — Telehealth: Payer: Self-pay | Admitting: Podiatry

## 2018-07-09 NOTE — Telephone Encounter (Signed)
Called and spoke to [place] at [facility] Surgery Scheduling. Told her to cancel surgery and the reason is patient is rescheduling surgery to October.

## 2018-07-09 NOTE — Telephone Encounter (Signed)
I called the pt back and explained I was filling in for our surgical coordinator and that I did not think our doctors had set time slots with the hospital to do surgery. I told him I could get him tentatively scheduled for surgery and we would know for sure a week before if that date would work. Pt is tentatively scheduled for Wednesday, 07 October.

## 2018-07-09 NOTE — Telephone Encounter (Signed)
I'm scheduled for surgery on 03 June. I need to reschedule out to probably October. Due to my job and having several projects getting ready to start, I cannot afford to be out of work right now. If you could look at your calender and give me a call back.

## 2018-07-14 ENCOUNTER — Ambulatory Visit: Payer: PPO | Admitting: Podiatry

## 2018-07-14 ENCOUNTER — Other Ambulatory Visit: Payer: Self-pay

## 2018-07-14 ENCOUNTER — Encounter: Payer: Self-pay | Admitting: Podiatry

## 2018-07-14 VITALS — Temp 98.8°F

## 2018-07-14 DIAGNOSIS — L84 Corns and callosities: Secondary | ICD-10-CM | POA: Diagnosis not present

## 2018-07-14 DIAGNOSIS — E1151 Type 2 diabetes mellitus with diabetic peripheral angiopathy without gangrene: Secondary | ICD-10-CM | POA: Diagnosis not present

## 2018-07-14 DIAGNOSIS — E1169 Type 2 diabetes mellitus with other specified complication: Secondary | ICD-10-CM

## 2018-07-14 DIAGNOSIS — E1142 Type 2 diabetes mellitus with diabetic polyneuropathy: Secondary | ICD-10-CM | POA: Diagnosis not present

## 2018-07-14 DIAGNOSIS — B351 Tinea unguium: Secondary | ICD-10-CM

## 2018-07-14 DIAGNOSIS — E119 Type 2 diabetes mellitus without complications: Secondary | ICD-10-CM

## 2018-07-14 NOTE — Patient Instructions (Signed)
Diabetes Mellitus and Foot Care  Foot care is an important part of your health, especially when you have diabetes. Diabetes may cause you to have problems because of poor blood flow (circulation) to your feet and legs, which can cause your skin to:   Become thinner and drier.   Break more easily.   Heal more slowly.   Peel and crack.  You may also have nerve damage (neuropathy) in your legs and feet, causing decreased feeling in them. This means that you may not notice minor injuries to your feet that could lead to more serious problems. Noticing and addressing any potential problems early is the best way to prevent future foot problems.  How to care for your feet  Foot hygiene   Wash your feet daily with warm water and mild soap. Do not use hot water. Then, pat your feet and the areas between your toes until they are completely dry. Do not soak your feet as this can dry your skin.   Trim your toenails straight across. Do not dig under them or around the cuticle. File the edges of your nails with an emery board or nail file.   Apply a moisturizing lotion or petroleum jelly to the skin on your feet and to dry, brittle toenails. Use lotion that does not contain alcohol and is unscented. Do not apply lotion between your toes.  Shoes and socks   Wear clean socks or stockings every day. Make sure they are not too tight. Do not wear knee-high stockings since they may decrease blood flow to your legs.   Wear shoes that fit properly and have enough cushioning. Always look in your shoes before you put them on to be sure there are no objects inside.   To break in new shoes, wear them for just a few hours a day. This prevents injuries on your feet.  Wounds, scrapes, corns, and calluses   Check your feet daily for blisters, cuts, bruises, sores, and redness. If you cannot see the bottom of your feet, use a mirror or ask someone for help.   Do not cut corns or calluses or try to remove them with medicine.   If you  find a minor scrape, cut, or break in the skin on your feet, keep it and the skin around it clean and dry. You may clean these areas with mild soap and water. Do not clean the area with peroxide, alcohol, or iodine.   If you have a wound, scrape, corn, or callus on your foot, look at it several times a day to make sure it is healing and not infected. Check for:  ? Redness, swelling, or pain.  ? Fluid or blood.  ? Warmth.  ? Pus or a bad smell.  General instructions   Do not cross your legs. This may decrease blood flow to your feet.   Do not use heating pads or hot water bottles on your feet. They may burn your skin. If you have lost feeling in your feet or legs, you may not know this is happening until it is too late.   Protect your feet from hot and cold by wearing shoes, such as at the beach or on hot pavement.   Schedule a complete foot exam at least once a year (annually) or more often if you have foot problems. If you have foot problems, report any cuts, sores, or bruises to your health care provider immediately.  Contact a health care provider if:     You have a medical condition that increases your risk of infection and you have any cuts, sores, or bruises on your feet.   You have an injury that is not healing.   You have redness on your legs or feet.   You feel burning or tingling in your legs or feet.   You have pain or cramps in your legs and feet.   Your legs or feet are numb.   Your feet always feel cold.   You have pain around a toenail.  Get help right away if:   You have a wound, scrape, corn, or callus on your foot and:  ? You have pain, swelling, or redness that gets worse.  ? You have fluid or blood coming from the wound, scrape, corn, or callus.  ? Your wound, scrape, corn, or callus feels warm to the touch.  ? You have pus or a bad smell coming from the wound, scrape, corn, or callus.  ? You have a fever.  ? You have a red line going up your leg.  Summary   Check your feet every day  for cuts, sores, red spots, swelling, and blisters.   Moisturize feet and legs daily.   Wear shoes that fit properly and have enough cushioning.   If you have foot problems, report any cuts, sores, or bruises to your health care provider immediately.   Schedule a complete foot exam at least once a year (annually) or more often if you have foot problems.  This information is not intended to replace advice given to you by your health care provider. Make sure you discuss any questions you have with your health care provider.  Document Released: 01/26/2000 Document Revised: 03/12/2017 Document Reviewed: 03/01/2016  Elsevier Interactive Patient Education  2019 Elsevier Inc.

## 2018-07-15 ENCOUNTER — Ambulatory Visit (HOSPITAL_BASED_OUTPATIENT_CLINIC_OR_DEPARTMENT_OTHER): Admission: RE | Admit: 2018-07-15 | Payer: PPO | Source: Home / Self Care | Admitting: Podiatry

## 2018-07-15 ENCOUNTER — Encounter (HOSPITAL_BASED_OUTPATIENT_CLINIC_OR_DEPARTMENT_OTHER): Admission: RE | Payer: Self-pay | Source: Home / Self Care

## 2018-07-15 SURGERY — EXCISION, METATARSAL BONE, HEAD
Anesthesia: General | Laterality: Right

## 2018-07-18 NOTE — Progress Notes (Signed)
Subjective: Travis Blair presents with diabetes, diabetic neuropathy and cc of painful, discolored, thick toenails and plantar calluses which interfere with activities of daily living.  He is requesting new diabetic shoes on today's visit.   [name], MD is his PCP and last visit was February, 2020. He states his blood sugar was 231 mg/dl this morning.   Current Outpatient Medications:  .  aspirin EC 81 MG tablet, Take 81 mg by mouth daily., Disp: , Rfl:  .  atorvastatin (LIPITOR) 20 MG tablet, Take 1 tablet (20 mg total) by mouth daily., Disp: 30 tablet, Rfl: 11 .  glipiZIDE (GLUCOTROL XL) 10 MG 24 hr tablet, TAKE 1 TABLET BY MOUTH EVERY DAY IN THE MORNING, Disp: 90 tablet, Rfl: 0 .  sildenafil (REVATIO) 20 MG tablet, Take 5 tablets as needed for erectile dysfunction, Disp: 50 tablet, Rfl: 3 .  torsemide (DEMADEX) 20 MG tablet, Take one tablet by mouth daily as directed., Disp: 90 tablet, Rfl: 3 .  ELIQUIS 5 MG TABS tablet, TAKE 1 TABLET BY MOUTH TWICE A DAY (Patient not taking: Reported on 07/14/2018), Disp: 60 tablet, Rfl: 5  No Known Allergies  Objective: Vitals:   07/14/18 1137  Temp: 98.8 F (37.1 C)    Vascular Examination: Capillary refill time immediate x 10 digits.  Dorsalis pedis pulses palpable b/l.  Posterior tibial pulses nonpalpable b/l.  Digital hair present x 10 digits.  Skin temperature gradient  Subjective:  Travis Blair presents to clinic today with cc of  painful, thick, discolored, elongated toenails 1-5 b/l that become tender and cannot cut because of thickness.  Pain is aggravated when wearing enclosed shoe gear.  [name], MD    Current Outpatient Medications:  .  aspirin EC 81 MG tablet, Take 81 mg by mouth daily., Disp: , Rfl:  .  atorvastatin (LIPITOR) 20 MG tablet, Take 1 tablet (20 mg total) by mouth daily., Disp: 30 tablet, Rfl: 11 .  glipiZIDE (GLUCOTROL XL) 10 MG 24 hr tablet, TAKE 1 TABLET BY MOUTH EVERY DAY IN THE  MORNING, Disp: 90 tablet, Rfl: 0 .  sildenafil (REVATIO) 20 MG tablet, Take 5 tablets as needed for erectile dysfunction, Disp: 50 tablet, Rfl: 3 .  torsemide (DEMADEX) 20 MG tablet, Take one tablet by mouth daily as directed., Disp: 90 tablet, Rfl: 3 .  ELIQUIS 5 MG TABS tablet, TAKE 1 TABLET BY MOUTH TWICE A DAY (Patient not taking: Reported on 07/14/2018), Disp: 60 tablet, Rfl: 5   No Known Allergies   Objective: Vitals:   07/14/18 1137  Temp: 98.8 F (37.1 C)    Physical Examination:  Vascular Examination: Capillary refill time immediate x 10 digits.  Palpable DP/PT pulses b/l.  Digital hair present b/l.  No edema noted b/l.  Skin temperature gradient WNL b/l.  Dermatological Examination: Skin with normal turgor, texture and tone b/l.  No open wounds b/l.  No interdigital macerations noted b/l.  Elongated, thick, discolored brittle toenails with subungual debris and pain on dorsal palpation of nailbeds 1-5 b/l.  Incurvated nailplate b/l great toes with tenderness to palpation. +Nail border hypertrophy. No erythema, no edema, no drainage noted.  Musculoskeletal Examination: Muscle strength 5/5 to all muscle groups b/l.  Hammertoe deformity 2nd digit b/l.  No pain, crepitus or joint discomfort with active/passive ROM.  Neurological Examination: Sensation absent b/l when tested with 10 gram monofilament.  Vibratory sensation absent b/l.  Assessment: Mycotic nail infection with pain 1-5 b/l Calluses submet head  5 b/l Ingrown toenails b/l great toes, noninfected NIDDM with neuropathy and PAD   Plan: 1. Diabetic foot examination performed today. Continue diabetic foot care principles. Literature dispensed today. 2. Toenails 1-5 b/l were debrided in length and girth without iatrogenic laceration.Offending nail borders debrided and curretaged b/l great toes. Borders cleansed with alcohol. Antibiotic ointment applied. [name] instructed to apply antibiotic  ointment to both great toes once daily for one week. 3. Continue soft, supportive shoe gear daily. Patient qualifies for diabetic shoes with the following diagnoses: NIDDM with neuropathy (LOPS), Hammertoe deformity b/l 2nd digits. Per Medicare guidelines, patient's feet need to be evaluated by an MD/DO managing patient's diabetes and diabetic shoe certification form needs to be signed by the MD/DO. If patient's diabetes is being managed by an Endocrinologist, the Endocrinologist must evaluate patient's feet and sign the Medicare diabetic shoe certification form. 4. Report any pedal injuries to medical professional. 5. Follow up 3 months. 6. Patient/POA to call should there be a question/concern in there interim.  onal  1. Follow up 3 months.  2. Patient/POA to call should there be a concern in the interim.

## 2018-07-22 ENCOUNTER — Telehealth: Payer: PPO | Admitting: Physician Assistant

## 2018-07-24 ENCOUNTER — Other Ambulatory Visit: Payer: PPO

## 2018-07-27 ENCOUNTER — Other Ambulatory Visit: Payer: Self-pay

## 2018-07-27 ENCOUNTER — Other Ambulatory Visit: Payer: PPO

## 2018-07-27 ENCOUNTER — Other Ambulatory Visit: Payer: PPO | Admitting: *Deleted

## 2018-07-27 DIAGNOSIS — E7800 Pure hypercholesterolemia, unspecified: Secondary | ICD-10-CM

## 2018-07-27 DIAGNOSIS — I5032 Chronic diastolic (congestive) heart failure: Secondary | ICD-10-CM

## 2018-07-27 DIAGNOSIS — I2510 Atherosclerotic heart disease of native coronary artery without angina pectoris: Secondary | ICD-10-CM | POA: Insufficient documentation

## 2018-07-27 DIAGNOSIS — E782 Mixed hyperlipidemia: Secondary | ICD-10-CM

## 2018-07-27 DIAGNOSIS — I10 Essential (primary) hypertension: Secondary | ICD-10-CM | POA: Insufficient documentation

## 2018-07-27 LAB — LIPID PANEL
Chol/HDL Ratio: 7.1 ratio — ABNORMAL HIGH (ref 0.0–5.0)
Cholesterol, Total: 219 mg/dL — ABNORMAL HIGH (ref 100–199)
HDL: 31 mg/dL — ABNORMAL LOW (ref >39)
Triglycerides: 411 mg/dL — ABNORMAL HIGH (ref 0–149)

## 2018-07-27 LAB — BASIC METABOLIC PANEL
BUN/Creatinine Ratio: 18 (ref 10–24)
BUN: 41 mg/dL — ABNORMAL HIGH (ref 8–27)
CO2: 21 mmol/L (ref 20–29)
Calcium: 9.5 mg/dL (ref 8.6–10.2)
Chloride: 98 mmol/L (ref 96–106)
Creatinine, Ser: 2.25 mg/dL — ABNORMAL HIGH (ref 0.76–1.27)
GFR calc Af Amer: 34 mL/min/{1.73_m2} — ABNORMAL LOW (ref >59)
GFR calc non Af Amer: 29 mL/min/{1.73_m2} — ABNORMAL LOW (ref >59)
Glucose: 279 mg/dL — ABNORMAL HIGH (ref 65–99)
Potassium: 4.9 mmol/L (ref 3.5–5.2)
Sodium: 135 mmol/L (ref 134–144)

## 2018-07-27 NOTE — Progress Notes (Signed)
Virtual Visit via Video Note   This visit type was conducted due to national recommendations for restrictions regarding the COVID-19 Pandemic (e.g. social distancing) in an effort to limit this patient's exposure and mitigate transmission in our community.  Due to his co-morbid illnesses, this patient is at least at moderate risk for complications without adequate follow up.  This format is felt to be most appropriate for this patient at this time.  All issues noted in this document were discussed and addressed.  A limited physical exam was performed with this format.  Please refer to the patient's chart for his consent to telehealth for [facility].   Date:  07/28/2018   ID:  Travis Blair, DOB 30-Mar-1950, MRN [id number]  Patient Location: Other:  Sitting in his car Provider Location: Home  PCP:  [name], MD  Cardiologist:  [name], MD   Electrophysiologist:  None  Nephrologist: [name], MD  Evaluation Performed:  Follow-Up Visit  Chief Complaint:  FU on CAD, CHF  History of Present Illness:    Travis Blair is a 68 y.o. male with:  Coronary artery disease  S/p CABG (post op AFib >> Amiodarone)  Diastolic CHF  Hx of DVT  Tx w/ anticoagulation with Eliquis  Chronic kidney disease  Hypertension   Hyperlipidemia   Rosuvastatin caused leg cramps  Diabetes Mellitus  OSA  He was last seen by Dr. Acie Fredrickson in 12/2017.    Today, he notes he is doing well.  He has not had any chest discomfort or shortness of breath.  He has not had syncope or near syncope.  He has not had orthopnea.  He has chronic lower extremity swelling.  He did have vein ablation with Dr. Scot Dock in December.  He has had less swelling since then.  He tells me he was taken off of Eliquis after his procedure.  The patient does not have symptoms concerning for COVID-19 infection (fever, chills, cough, or new shortness of breath).    Past Medical History:  Diagnosis Date  .  Anemia associated with chronic renal failure basically  . Asthma    as a child  . Chronic kidney disease    Sees Dr [name]  . Coronary artery disease   . Diabetes mellitus without complication ([place])   . DVT (deep venous thrombosis) ([place])   . Hyperlipidemia   . Hypertension   . Myocardial infarction ([place])   . Obesity   . Pneumonia 2018  . Sleep apnea    Past Surgical History:  Procedure Laterality Date  . COLONOSCOPY    . CORONARY ARTERY BYPASS GRAFT N/A 03/07/2017   Procedure: CORONARY ARTERY BYPASS GRAFTING (CABG), X 3 , USING RIGHT INTERNAL MAMMARY ARTERY, AND RIGHT LEG GREATER SAPHENOUS VEIN HARVESTED ENDOSCOPICALLY;  Surgeon: [name], MD;  Location: [facility];  Service: Open Heart Surgery;  Laterality: N/A;  . HAND SURGERY    . RIGHT/LEFT HEART CATH AND CORONARY ANGIOGRAPHY N/A 12/20/2016   Procedure: RIGHT/LEFT HEART CATH AND CORONARY ANGIOGRAPHY;  Surgeon: [name], MD;  Location: [facility];  Service: Cardiovascular;  Laterality: N/A;  . TEE WITHOUT CARDIOVERSION N/A 03/07/2017   Procedure: TRANSESOPHAGEAL ECHOCARDIOGRAM (TEE);  Surgeon: [name], [name], MD;  Location: [facility];  Service: Open Heart Surgery;  Laterality: N/A;  . TONSILLECTOMY       Current Meds  Medication Sig  . aspirin EC 81 MG tablet Take 81 mg by mouth daily.  Marland Kitchen glipiZIDE (GLUCOTROL  XL) 10 MG 24 hr tablet TAKE 1 TABLET BY MOUTH EVERY DAY IN THE MORNING (Patient taking differently: 5 mg. )  . Multiple Vitamin (MULTIVITAMIN) tablet Take 1 tablet by mouth daily.  . [DISCONTINUED] atorvastatin (LIPITOR) 20 MG tablet Take 1 tablet (20 mg total) by mouth daily.  . [DISCONTINUED] torsemide (DEMADEX) 20 MG tablet Take one tablet by mouth daily as directed.     Allergies:   Patient has no known allergies.   Social History   Tobacco Use  . Smoking status: Never Smoker  . Smokeless tobacco: Never Used  Substance Use Topics  . Alcohol use: No  . Drug use: No     Family Hx: The  patient's family history includes Cancer in his father, mother, and another family member; Heart disease in his father, mother, and another family member; Hyperlipidemia in an other family member; Hypertension in an other family member; Lymphoma in his sister.  ROS:   Please see the history of present illness.     All other systems reviewed and are negative.   Prior CV studies:   The following studies were reviewed today:  Carotid US 03/04/17 Bilateral ICA 1-39  Cardiac Catheterization 12/20/16 LAD prox 90, 95, 60 LCx prox 60, 99, 95; OM1 70 RCA mid 95, 95, dist 60; RPDA ost 60; RPAVB 90, side branch 90  Myoview 12/02/16 Lg infarct with mod peri-infarct ischemia in RCA/LCx territory; EF 41 High Risk study  Echo 11/17/16 Mild LVH, EF 50-55, no RWMA, Gr 2 DD, mild LAE, mod RVE, severe RAE   Labs/Other Tests and Data Reviewed:    EKG:  No ECG reviewed.  Recent Labs: 04/07/2018: Hemoglobin 12.8 06/30/2018: ALT 18 07/27/2018: BUN 41; Creatinine, Ser 2.25; Potassium 4.9; Sodium 135   Recent Lipid Panel Lab Results  Component Value Date/Time   CHOL 219 (H) 07/27/2018 08:10 AM   TRIG 411 (H) 07/27/2018 08:10 AM   HDL 31 (L) 07/27/2018 08:10 AM   CHOLHDL 7.1 (H) 07/27/2018 08:10 AM   LDLCALC Comment 07/27/2018 08:10 AM    Wt Readings from Last 3 Encounters:  07/28/18 (!) 315 lb (142.9 kg)  02/24/18 (!) 310 lb (140.6 kg)  01/29/18 (!) 310 lb (140.6 kg)     Objective:    Vital Signs:  Ht 6\' 2"  (1.88 m)   Wt (!) 315 lb (142.9 kg)   BMI 40.44 kg/m    VITAL SIGNS:  reviewed GEN:  no acute distress EYES:  sclerae anicteric, EOMI - Extraocular Movements Intact RESPIRATORY:  Normal respiratory effort NEURO:  Alert and oriented PSYCH:  normal affect  ASSESSMENT & PLAN:    Coronary artery disease involving native coronary artery of native heart without angina pectoris - Plan: History of CABG in January 2019.  He is doing well without anginal symptoms.  Continue aspirin,  statin.  Chronic diastolic heart failure ([place]) - Plan: EF 50-55 with moderate diastolic dysfunction by echo in October 2018.  Overall, volume status seems to be stable.  CKD (chronic kidney disease) stage 3, GFR 30-59 ml/min (HCC) - Plan: Recent labs indicate worsening creatinine.  I have asked him to arrange follow-up with Dr. [name] for his chronic kidney disease.  I have asked him to decrease his torsemide to every other day.  He knows to resume daily torsemide if he has worsening swelling or shortness of breath.  We will plan on follow-up labs in about 4 to 6 weeks.  History of deep venous thrombosis - Plan: He has undergone  vein ablation with Dr. Scot Dock.  He was apparently taken off of anticoagulation after this procedure.  Essential hypertension - Plan: He notes recent blood pressures 295F systolic.  We discussed the importance of good blood pressure control.  I have asked him to monitor his blood pressure the next couple weeks and send me readings for review.  Mixed hyperlipidemia - Plan: Recent triglyceride levels over 400.  We discussed adding Vascepa.  The patient did have dizziness when he took atorvastatin 40 mg.  He would like to try taking atorvastatin 40 mg at bedtime to see if he can tolerate this better.  If his triglyceride levels do not come down with increasing his atorvastatin, we can certainly consider adding the sleep at that point.  Plan follow-up CMET, lipids in 4 to 6 weeks.  COVID-19 Education: The signs and symptoms of COVID-19 were discussed with the patient and how to seek care for testing (follow up with PCP or arrange E-visit).  The importance of social distancing was discussed today.  Time:   Today, I have spent 17 minutes with the patient with telehealth technology discussing the above problems.     Medication Adjustments/Labs and Tests Ordered: Current medicines are reviewed at length with the patient today.  Concerns regarding medicines are outlined above.    Tests Ordered: Orders Placed This Encounter  Procedures  . Comprehensive metabolic panel  . Lipid panel  . Direct LDL    Medication Changes: Meds ordered this encounter  Medications  . atorvastatin (LIPITOR) 40 MG tablet    Sig: Take 1 tablet (40 mg total) by mouth daily.    Dispense:  90 tablet    Refill:  3    Dose increase    Order Specific Question:   Supervising Provider    Answer:   Lelon Perla [[id number]]  . torsemide (DEMADEX) 20 MG tablet    Sig: Take 1 tablet every other day.    Dispense:  30 tablet    Refill:  11    Dose change    Order Specific Question:   Supervising Provider    Answer:   Lelon Perla [[id number]]    Follow Up:  In Person in 2-3 month(s) with Dr. Acie Fredrickson or Richardson Dopp, PA-C   Signed, Richardson Dopp, PA-C  07/28/2018 7:52 PM    [place]

## 2018-07-28 ENCOUNTER — Telehealth (INDEPENDENT_AMBULATORY_CARE_PROVIDER_SITE_OTHER): Payer: PPO | Admitting: Physician Assistant

## 2018-07-28 ENCOUNTER — Telehealth: Payer: Self-pay

## 2018-07-28 ENCOUNTER — Encounter: Payer: Self-pay | Admitting: Physician Assistant

## 2018-07-28 VITALS — Ht 74.0 in | Wt 315.0 lb

## 2018-07-28 DIAGNOSIS — Z7189 Other specified counseling: Secondary | ICD-10-CM

## 2018-07-28 DIAGNOSIS — I2510 Atherosclerotic heart disease of native coronary artery without angina pectoris: Secondary | ICD-10-CM

## 2018-07-28 DIAGNOSIS — N183 Chronic kidney disease, stage 3 unspecified: Secondary | ICD-10-CM

## 2018-07-28 DIAGNOSIS — Z86718 Personal history of other venous thrombosis and embolism: Secondary | ICD-10-CM

## 2018-07-28 DIAGNOSIS — I10 Essential (primary) hypertension: Secondary | ICD-10-CM

## 2018-07-28 DIAGNOSIS — I5032 Chronic diastolic (congestive) heart failure: Secondary | ICD-10-CM

## 2018-07-28 DIAGNOSIS — E782 Mixed hyperlipidemia: Secondary | ICD-10-CM

## 2018-07-28 MED ORDER — ATORVASTATIN CALCIUM 40 MG PO TABS: 40.0000 mg | ORAL_TABLET | Freq: Every day | ORAL | 3 refills | Status: DC

## 2018-07-28 MED ORDER — TORSEMIDE 20 MG PO TABS: ORAL_TABLET | ORAL | 11 refills | Status: DC

## 2018-07-28 NOTE — Telephone Encounter (Signed)

## 2018-07-28 NOTE — Patient Instructions (Signed)
Medication Instructions:  Increase Atorvastatin to 40 mg Once daily in the evening Decrease Torsemide to 20 mg every other day.  If you need a refill on your cardiac medications before your next appointment, please call your pharmacy.   Lab work: In 4-6 weeks:  Fasting CMET, Lipids, LDL  If you have labs (blood work) drawn today and your tests are completely normal, you will receive your results only by: Marland Kitchen MyChart Message (if you have MyChart) OR . A paper copy in the mail If you have any lab test that is abnormal or we need to change your treatment, we will call you to review the results.  Testing/Procedures: None   Follow-Up: At [facility], you and your health needs are our priority.  As part of our continuing mission to provide you with exceptional heart care, we have created designated Provider Care Teams.  These Care Teams include your primary Cardiologist (physician) and Advanced Practice Providers (APPs -  Physician Assistants and Nurse Practitioners) who all work together to provide you with the care you need, when you need it. . Dr. Acie Fredrickson or Richardson Dopp, PA-C in 2-3 months.  Any Other Special Instructions Will Be Listed Below (If Applicable).  Continue to work on diet, exercise and weight loss. Please call Dr. [name] for early follow up  Check you blood pressure once daily for 2 weeks and send me the reading for review. If you have more swelling or increased shortness of breath, you can resume Torsemide once daily for a week, then resume taking it every other day.

## 2018-08-03 ENCOUNTER — Other Ambulatory Visit: Payer: Self-pay

## 2018-08-03 ENCOUNTER — Ambulatory Visit: Payer: PPO | Admitting: Orthotics

## 2018-08-03 DIAGNOSIS — M21961 Unspecified acquired deformity of right lower leg: Secondary | ICD-10-CM

## 2018-08-03 DIAGNOSIS — L84 Corns and callosities: Secondary | ICD-10-CM

## 2018-08-03 DIAGNOSIS — Q828 Other specified congenital malformations of skin: Secondary | ICD-10-CM

## 2018-08-03 DIAGNOSIS — E1151 Type 2 diabetes mellitus with diabetic peripheral angiopathy without gangrene: Secondary | ICD-10-CM

## 2018-08-03 DIAGNOSIS — E1142 Type 2 diabetes mellitus with diabetic polyneuropathy: Secondary | ICD-10-CM

## 2018-08-03 NOTE — Progress Notes (Signed)

## 2018-08-04 DIAGNOSIS — L0231 Cutaneous abscess of buttock: Secondary | ICD-10-CM | POA: Diagnosis not present

## 2018-08-07 ENCOUNTER — Other Ambulatory Visit: Payer: Self-pay | Admitting: *Deleted

## 2018-08-07 DIAGNOSIS — Z01812 Encounter for preprocedural laboratory examination: Secondary | ICD-10-CM

## 2018-08-10 DIAGNOSIS — L0231 Cutaneous abscess of buttock: Secondary | ICD-10-CM | POA: Diagnosis not present

## 2018-08-17 DIAGNOSIS — E119 Type 2 diabetes mellitus without complications: Secondary | ICD-10-CM | POA: Diagnosis not present

## 2018-08-17 DIAGNOSIS — L0231 Cutaneous abscess of buttock: Secondary | ICD-10-CM | POA: Diagnosis not present

## 2018-08-20 DIAGNOSIS — L02416 Cutaneous abscess of left lower limb: Secondary | ICD-10-CM | POA: Diagnosis not present

## 2018-08-20 DIAGNOSIS — I2510 Atherosclerotic heart disease of native coronary artery without angina pectoris: Secondary | ICD-10-CM | POA: Diagnosis not present

## 2018-08-20 DIAGNOSIS — I4891 Unspecified atrial fibrillation: Secondary | ICD-10-CM | POA: Diagnosis not present

## 2018-08-20 DIAGNOSIS — Z85828 Personal history of other malignant neoplasm of skin: Secondary | ICD-10-CM | POA: Diagnosis not present

## 2018-08-20 DIAGNOSIS — N2581 Secondary hyperparathyroidism of renal origin: Secondary | ICD-10-CM | POA: Diagnosis not present

## 2018-08-20 DIAGNOSIS — E1122 Type 2 diabetes mellitus with diabetic chronic kidney disease: Secondary | ICD-10-CM | POA: Diagnosis not present

## 2018-08-20 DIAGNOSIS — D631 Anemia in chronic kidney disease: Secondary | ICD-10-CM | POA: Diagnosis not present

## 2018-08-20 DIAGNOSIS — I872 Venous insufficiency (chronic) (peripheral): Secondary | ICD-10-CM | POA: Diagnosis not present

## 2018-08-20 DIAGNOSIS — Z1159 Encounter for screening for other viral diseases: Secondary | ICD-10-CM | POA: Diagnosis not present

## 2018-08-20 DIAGNOSIS — N189 Chronic kidney disease, unspecified: Secondary | ICD-10-CM | POA: Diagnosis not present

## 2018-08-20 DIAGNOSIS — L570 Actinic keratosis: Secondary | ICD-10-CM | POA: Diagnosis not present

## 2018-08-20 DIAGNOSIS — I129 Hypertensive chronic kidney disease with stage 1 through stage 4 chronic kidney disease, or unspecified chronic kidney disease: Secondary | ICD-10-CM | POA: Diagnosis not present

## 2018-08-20 DIAGNOSIS — N183 Chronic kidney disease, stage 3 (moderate): Secondary | ICD-10-CM | POA: Diagnosis not present

## 2018-08-27 DIAGNOSIS — R030 Elevated blood-pressure reading, without diagnosis of hypertension: Secondary | ICD-10-CM | POA: Diagnosis not present

## 2018-08-27 DIAGNOSIS — G4730 Sleep apnea, unspecified: Secondary | ICD-10-CM | POA: Diagnosis not present

## 2018-09-02 ENCOUNTER — Other Ambulatory Visit: Payer: PPO

## 2018-09-09 DIAGNOSIS — L03818 Cellulitis of other sites: Secondary | ICD-10-CM | POA: Diagnosis not present

## 2018-09-17 DIAGNOSIS — I10 Essential (primary) hypertension: Secondary | ICD-10-CM | POA: Diagnosis not present

## 2018-09-17 DIAGNOSIS — L03317 Cellulitis of buttock: Secondary | ICD-10-CM | POA: Diagnosis not present

## 2018-09-18 ENCOUNTER — Ambulatory Visit: Payer: PPO | Admitting: Podiatry

## 2018-09-18 ENCOUNTER — Encounter: Payer: Self-pay | Admitting: Podiatry

## 2018-09-18 ENCOUNTER — Ambulatory Visit: Payer: PPO | Admitting: Orthotics

## 2018-09-18 ENCOUNTER — Other Ambulatory Visit: Payer: Self-pay

## 2018-09-18 VITALS — Temp 97.2°F

## 2018-09-18 DIAGNOSIS — B351 Tinea unguium: Secondary | ICD-10-CM

## 2018-09-18 DIAGNOSIS — L84 Corns and callosities: Secondary | ICD-10-CM

## 2018-09-18 DIAGNOSIS — M79675 Pain in left toe(s): Secondary | ICD-10-CM | POA: Diagnosis not present

## 2018-09-18 DIAGNOSIS — E1142 Type 2 diabetes mellitus with diabetic polyneuropathy: Secondary | ICD-10-CM

## 2018-09-18 DIAGNOSIS — Q828 Other specified congenital malformations of skin: Secondary | ICD-10-CM

## 2018-09-18 DIAGNOSIS — M2042 Other hammer toe(s) (acquired), left foot: Secondary | ICD-10-CM

## 2018-09-18 DIAGNOSIS — E1151 Type 2 diabetes mellitus with diabetic peripheral angiopathy without gangrene: Secondary | ICD-10-CM

## 2018-09-18 DIAGNOSIS — M79674 Pain in right toe(s): Secondary | ICD-10-CM

## 2018-09-18 DIAGNOSIS — L600 Ingrowing nail: Secondary | ICD-10-CM

## 2018-09-18 DIAGNOSIS — M2041 Other hammer toe(s) (acquired), right foot: Secondary | ICD-10-CM

## 2018-09-18 NOTE — Patient Instructions (Signed)
Diabetes Mellitus and Foot Care Foot care is an important part of your health, especially when you have diabetes. Diabetes may cause you to have problems because of poor blood flow (circulation) to your feet and legs, which can cause your skin to:  Become thinner and drier.  Break more easily.  Heal more slowly.  Peel and crack. You may also have nerve damage (neuropathy) in your legs and feet, causing decreased feeling in them. This means that you may not notice minor injuries to your feet that could lead to more serious problems. Noticing and addressing any potential problems early is the best way to prevent future foot problems. How to care for your feet Foot hygiene  Wash your feet daily with warm water and mild soap. Do not use hot water. Then, pat your feet and the areas between your toes until they are completely dry. Do not soak your feet as this can dry your skin.  Trim your toenails straight across. Do not dig under them or around the cuticle. File the edges of your nails with an emery board or nail file.  Apply a moisturizing lotion or petroleum jelly to the skin on your feet and to dry, brittle toenails. Use lotion that does not contain alcohol and is unscented. Do not apply lotion between your toes. Shoes and socks  Wear clean socks or stockings every day. Make sure they are not too tight. Do not wear knee-high stockings since they may decrease blood flow to your legs.  Wear shoes that fit properly and have enough cushioning. Always look in your shoes before you put them on to be sure there are no objects inside.  To break in new shoes, wear them for just a few hours a day. This prevents injuries on your feet. Wounds, scrapes, corns, and calluses  Check your feet daily for blisters, cuts, bruises, sores, and redness. If you cannot see the bottom of your feet, use a mirror or ask someone for help.  Do not cut corns or calluses or try to remove them with medicine.  If you  find a minor scrape, cut, or break in the skin on your feet, keep it and the skin around it clean and dry. You may clean these areas with mild soap and water. Do not clean the area with peroxide, alcohol, or iodine.  If you have a wound, scrape, corn, or callus on your foot, look at it several times a day to make sure it is healing and not infected. Check for: ? Redness, swelling, or pain. ? Fluid or blood. ? Warmth. ? Pus or a bad smell. General instructions  Do not cross your legs. This may decrease blood flow to your feet.  Do not use heating pads or hot water bottles on your feet. They may burn your skin. If you have lost feeling in your feet or legs, you may not know this is happening until it is too late.  Protect your feet from hot and cold by wearing shoes, such as at the beach or on hot pavement.  Schedule a complete foot exam at least once a year (annually) or more often if you have foot problems. If you have foot problems, report any cuts, sores, or bruises to your health care provider immediately. Contact a health care provider if:  You have a medical condition that increases your risk of infection and you have any cuts, sores, or bruises on your feet.  You have an injury that is not   healing.  You have redness on your legs or feet.  You feel burning or tingling in your legs or feet.  You have pain or cramps in your legs and feet.  Your legs or feet are numb.  Your feet always feel cold.  You have pain around a toenail. Get help right away if:  You have a wound, scrape, corn, or callus on your foot and: ? You have pain, swelling, or redness that gets worse. ? You have fluid or blood coming from the wound, scrape, corn, or callus. ? Your wound, scrape, corn, or callus feels warm to the touch. ? You have pus or a bad smell coming from the wound, scrape, corn, or callus. ? You have a fever. ? You have a red line going up your leg. Summary  Check your feet every day  for cuts, sores, red spots, swelling, and blisters.  Moisturize feet and legs daily.  Wear shoes that fit properly and have enough cushioning.  If you have foot problems, report any cuts, sores, or bruises to your health care provider immediately.  Schedule a complete foot exam at least once a year (annually) or more often if you have foot problems. This information is not intended to replace advice given to you by your health care provider. Make sure you discuss any questions you have with your health care provider. Document Released: 01/26/2000 Document Revised: 03/12/2017 Document Reviewed: 03/01/2016 Elsevier Patient Education  2020 [employer]   Onychomycosis/Fungal Toenails  WHAT IS IT? An infection that lies within the keratin of your nail plate that is caused by a fungus.  WHY ME? Fungal infections affect all ages, sexes, races, and creeds.  There may be many factors that predispose you to a fungal infection such as age, coexisting medical conditions such as diabetes, or an autoimmune disease; stress, medications, fatigue, genetics, etc.  Bottom line: fungus thrives in a warm, moist environment and your shoes offer such a location.  IS IT CONTAGIOUS? Theoretically, yes.  You do not want to share shoes, nail clippers or files with someone who has fungal toenails.  Walking around barefoot in the same room or sleeping in the same bed is unlikely to transfer the organism.  It is important to realize, however, that fungus can spread easily from one nail to the next on the same foot.  HOW DO WE TREAT THIS?  There are several ways to treat this condition.  Treatment may depend on many factors such as age, medications, pregnancy, liver and kidney conditions, etc.  It is best to ask your doctor which options are available to you.  1. No treatment.   Unlike many other medical concerns, you can live with this condition.  However for many people this can be a painful condition and may lead to  ingrown toenails or a bacterial infection.  It is recommended that you keep the nails cut short to help reduce the amount of fungal nail. 2. Topical treatment.  These range from herbal remedies to prescription strength nail lacquers.  About 40-50% effective, topicals require twice daily application for approximately 9 to 12 months or until an entirely new nail has grown out.  The most effective topicals are medical grade medications available through physicians offices. 3. Oral antifungal medications.  With an 80-90% cure rate, the most common oral medication requires 3 to 4 months of therapy and stays in your system for a year as the new nail grows out.  Oral antifungal medications do require   blood work to make sure it is a safe drug for you.  A liver function panel will be performed prior to starting the medication and after the first month of treatment.  It is important to have the blood work performed to avoid any harmful side effects.  In general, this medication safe but blood work is required. 4. Laser Therapy.  This treatment is performed by applying a specialized laser to the affected nail plate.  This therapy is noninvasive, fast, and non-painful.  It is not covered by insurance and is therefore, out of pocket.  The results have been very good with a 80-95% cure rate.  [facility] is the only practice in the area to offer this therapy. 5. Permanent Nail Avulsion.  Removing the entire nail so that a new nail will not grow back. 

## 2018-09-21 ENCOUNTER — Other Ambulatory Visit: Payer: Self-pay

## 2018-09-21 ENCOUNTER — Other Ambulatory Visit: Payer: PPO | Admitting: *Deleted

## 2018-09-21 ENCOUNTER — Other Ambulatory Visit: Payer: Self-pay | Admitting: Physician Assistant

## 2018-09-21 DIAGNOSIS — E782 Mixed hyperlipidemia: Secondary | ICD-10-CM | POA: Diagnosis not present

## 2018-09-21 DIAGNOSIS — I2510 Atherosclerotic heart disease of native coronary artery without angina pectoris: Secondary | ICD-10-CM

## 2018-09-21 DIAGNOSIS — N183 Chronic kidney disease, stage 3 unspecified: Secondary | ICD-10-CM

## 2018-09-21 LAB — COMPREHENSIVE METABOLIC PANEL
ALT: 17 IU/L (ref 0–44)
AST: 20 IU/L (ref 0–40)
Albumin/Globulin Ratio: 1.3 (ref 1.2–2.2)
Albumin: 3.6 g/dL — ABNORMAL LOW (ref 3.8–4.8)
Alkaline Phosphatase: 72 IU/L (ref 39–117)
BUN/Creatinine Ratio: 15 (ref 10–24)
BUN: 36 mg/dL — ABNORMAL HIGH (ref 8–27)
Bilirubin Total: 0.2 mg/dL (ref 0.0–1.2)
CO2: 19 mmol/L — ABNORMAL LOW (ref 20–29)
Calcium: 9 mg/dL (ref 8.6–10.2)
Chloride: 100 mmol/L (ref 96–106)
Creatinine, Ser: 2.38 mg/dL — ABNORMAL HIGH (ref 0.76–1.27)
GFR calc Af Amer: 31 mL/min/{1.73_m2} — ABNORMAL LOW (ref >59)
GFR calc non Af Amer: 27 mL/min/{1.73_m2} — ABNORMAL LOW (ref >59)
Globulin, Total: 2.7 g/dL (ref 1.5–4.5)
Glucose: 210 mg/dL — ABNORMAL HIGH (ref 65–99)
Potassium: 5.6 mmol/L — ABNORMAL HIGH (ref 3.5–5.2)
Sodium: 134 mmol/L (ref 134–144)
Total Protein: 6.3 g/dL (ref 6.0–8.5)

## 2018-09-21 LAB — LIPID PANEL
Chol/HDL Ratio: 6.2 ratio — ABNORMAL HIGH (ref 0.0–5.0)
Cholesterol, Total: 211 mg/dL — ABNORMAL HIGH (ref 100–199)
HDL: 34 mg/dL — ABNORMAL LOW (ref >39)
LDL Calculated: 107 mg/dL — ABNORMAL HIGH (ref 0–99)
Triglycerides: 350 mg/dL — ABNORMAL HIGH (ref 0–149)
VLDL Cholesterol Cal: 70 mg/dL — ABNORMAL HIGH (ref 5–40)

## 2018-09-21 LAB — LDL CHOLESTEROL, DIRECT: LDL Direct: 122 mg/dL — ABNORMAL HIGH (ref 0–99)

## 2018-09-22 ENCOUNTER — Ambulatory Visit: Payer: PPO | Admitting: Podiatry

## 2018-09-22 NOTE — Progress Notes (Signed)
Subjective: Travis Blair is a 68 y.o. y.o. male who presents for preventative diabetic foot care today.  He has history of ulceration submetatarsal head 5 of the right foot.  He is seen today for follow-up of chronic mycotic toenails as well as plantar calluses.  He is scheduled to have a 5th metatarsal head resection of the right foot due to chronic ulceration by Dr. March Rummage in October.  [name], MD is his PCP.   Current Outpatient Medications:  .  aspirin EC 81 MG tablet, Take 81 mg by mouth daily., Disp: , Rfl:  .  atorvastatin (LIPITOR) 40 MG tablet, Take 1 tablet (40 mg total) by mouth daily., Disp: 90 tablet, Rfl: 3 .  fluorouracil (EFUDEX) 5 % cream, , Disp: , Rfl:  .  glipiZIDE (GLUCOTROL XL) 10 MG 24 hr tablet, TAKE 1 TABLET BY MOUTH EVERY DAY IN THE MORNING (Patient taking differently: 5 mg. ), Disp: 90 tablet, Rfl: 0 .  Multiple Vitamin (MULTIVITAMIN) tablet, Take 1 tablet by mouth daily., Disp: , Rfl:  .  sulfamethoxazole-trimethoprim (BACTRIM DS) 800-160 MG tablet, , Disp: , Rfl:  .  torsemide (DEMADEX) 20 MG tablet, Take 1 tablet every other day., Disp: 30 tablet, Rfl: 11  No Known Allergies  Objective: Vitals:   09/18/18 1050  Temp: (!) 97.2 F (36.2 C)    Vascular Examination: Capillary refill time immediate x10 digits.  Dorsalis pedis and posterior tibial pulses are palpable bilaterally.  Digital hair is present bilaterally.  No lower extremity edema appreciated bilaterally.  Skin temperature gradient WNL b/l.  Dermatological Examination: Skin with normal turgor, texture and tone b/l.  Toenails 1-5 b/l discolored, thick, dystrophic with subungual debris and pain with palpation to nailbeds due to thickness of nails.Incurvated nailplate b/l great toes with tenderness to palpation. No erythema, no edema, no drainage noted from the left hallux.  There is early nail border hypertrophy noted of the right hallux lateral border with mild inflammation.  There  is no purulence no granulation tissue appreciated.  Hyperkeratotic lesion submetatarsal head 5 bilaterally.  There is no impending wound noted. No erythema, no edema, no drainage, no flocculence noted.   Musculoskeletal: Muscle strength 5/5 to all LE muscle groups.  Hammertoe deformity second digit bilaterally.  No pain crepitus or joint discomfort noted with active or passive range of motion of joints.  Neurological: Sensation absent with 10 gram monofilament.  Assessment: 1. Painful onychomycosis toenails 1-5 b/l 2.  Callus submetatarsal head 5 bilaterally 3.  Ingrown toenail right great toe, noninfected 4.  NIDDM with neuropathy  Plan: 1. Continue diabetic foot care principles. Literature dispensed on today. 2. Toenails 1-5 b/l were debrided in length and girth without iatrogenic bleeding. 3. Hyperkeratotic lesion(s) submetatarsal head 5 bilaterally pared with sterile scalpel blade without incident. 4. Offending nail border debrided and curretaged right hallux. Border cleansed with alcohol and tripe antibiotic applied.  Patient was instructed to apply antibiotic ointment to right great toe once daily for 1 week.  Call if he has any problems. 5. Patient to continue soft, supportive shoe gear daily. 6. Patient to report any pedal injuries to medical professional immediately. 7. Follow up 9 weeks.  8. Patient/POA to call should there be a concern in the interim.

## 2018-09-23 NOTE — Progress Notes (Signed)

## 2018-09-24 ENCOUNTER — Telehealth: Payer: Self-pay | Admitting: *Deleted

## 2018-09-24 ENCOUNTER — Other Ambulatory Visit: Payer: Self-pay | Admitting: *Deleted

## 2018-09-24 DIAGNOSIS — E782 Mixed hyperlipidemia: Secondary | ICD-10-CM

## 2018-09-24 MED ORDER — EZETIMIBE 10 MG PO TABS: 10.0000 mg | ORAL_TABLET | Freq: Every day | ORAL | 3 refills | Status: DC

## 2018-09-24 NOTE — Telephone Encounter (Signed)
S/w pt is aware Virtual visit for Monday, Aug 17 with Nicki Reaper has been moved to an in person visit on Oct 7 with Scott and repeat lipid/lft.

## 2018-09-24 NOTE — Telephone Encounter (Signed)
-----   Message from Liliane Shi, [place] sent at 09/24/2018 10:01 AM EDT ----- He needs an in person visit.  Reschedule him to a day in the office with either Phil or me in the next 1-2 mos. Richardson Dopp, PA-C    09/24/2018 10:01 AM  ----- Message ----- From: Tamsen Snider Sent: 09/24/2018   9:50 AM EDT To: Liliane Shi, PA-C  S/w pt today about lab results pt wanted to know if pt still needed [facility] appt on Monday

## 2018-09-28 ENCOUNTER — Telehealth: Payer: PPO | Admitting: Physician Assistant

## 2018-10-20 DIAGNOSIS — E119 Type 2 diabetes mellitus without complications: Secondary | ICD-10-CM | POA: Diagnosis not present

## 2018-10-20 DIAGNOSIS — I10 Essential (primary) hypertension: Secondary | ICD-10-CM | POA: Diagnosis not present

## 2018-10-20 DIAGNOSIS — E782 Mixed hyperlipidemia: Secondary | ICD-10-CM | POA: Diagnosis not present

## 2018-10-20 DIAGNOSIS — E1169 Type 2 diabetes mellitus with other specified complication: Secondary | ICD-10-CM | POA: Diagnosis not present

## 2018-10-22 DIAGNOSIS — Z6841 Body Mass Index (BMI) 40.0 and over, adult: Secondary | ICD-10-CM | POA: Diagnosis not present

## 2018-10-22 DIAGNOSIS — I2510 Atherosclerotic heart disease of native coronary artery without angina pectoris: Secondary | ICD-10-CM | POA: Diagnosis not present

## 2018-10-22 DIAGNOSIS — D509 Iron deficiency anemia, unspecified: Secondary | ICD-10-CM | POA: Diagnosis not present

## 2018-10-22 DIAGNOSIS — E875 Hyperkalemia: Secondary | ICD-10-CM | POA: Diagnosis not present

## 2018-10-22 DIAGNOSIS — E1122 Type 2 diabetes mellitus with diabetic chronic kidney disease: Secondary | ICD-10-CM | POA: Diagnosis not present

## 2018-10-22 DIAGNOSIS — E782 Mixed hyperlipidemia: Secondary | ICD-10-CM | POA: Diagnosis not present

## 2018-10-22 DIAGNOSIS — N183 Chronic kidney disease, stage 3 (moderate): Secondary | ICD-10-CM | POA: Diagnosis not present

## 2018-10-22 DIAGNOSIS — I10 Essential (primary) hypertension: Secondary | ICD-10-CM | POA: Diagnosis not present

## 2018-10-22 DIAGNOSIS — E1165 Type 2 diabetes mellitus with hyperglycemia: Secondary | ICD-10-CM | POA: Diagnosis not present

## 2018-11-06 ENCOUNTER — Telehealth: Payer: Self-pay | Admitting: *Deleted

## 2018-11-06 NOTE — Telephone Encounter (Signed)
DOS 11/18/2018 HAMMER TOE REPAIR 2ND  - 91368 AND METATARSAL HEAD RESECTION 5TH  - [phone] OF THE RIGHT FOOT.  HTA: Eligibility Date - 06/11/2016  Deductible - $0 Co-pay - $225  Co-insurance - 414% Pre-certification is NOT REQUIRED.

## 2018-11-09 ENCOUNTER — Other Ambulatory Visit (HOSPITAL_BASED_OUTPATIENT_CLINIC_OR_DEPARTMENT_OTHER): Payer: Self-pay

## 2018-11-09 DIAGNOSIS — G4730 Sleep apnea, unspecified: Secondary | ICD-10-CM

## 2018-11-09 DIAGNOSIS — R0683 Snoring: Secondary | ICD-10-CM

## 2018-11-11 NOTE — Progress Notes (Addendum)
Spoke with patient to schedule Covid screening for procedure scheduled with Dr. March Rummage 11/18/2018. Patient stated he would not do Covid screening. Explained to patient he would have to have screening before any procedure. Patient stated he would call Dr. Eleanora Neighbor office to cancel scheduled procedure.

## 2018-11-13 ENCOUNTER — Other Ambulatory Visit: Payer: Self-pay

## 2018-11-13 ENCOUNTER — Ambulatory Visit: Payer: PPO | Admitting: Podiatry

## 2018-11-13 ENCOUNTER — Encounter

## 2018-11-13 ENCOUNTER — Telehealth: Payer: Self-pay | Admitting: *Deleted

## 2018-11-13 DIAGNOSIS — B351 Tinea unguium: Secondary | ICD-10-CM | POA: Diagnosis not present

## 2018-11-13 DIAGNOSIS — M2041 Other hammer toe(s) (acquired), right foot: Secondary | ICD-10-CM

## 2018-11-13 DIAGNOSIS — E1169 Type 2 diabetes mellitus with other specified complication: Secondary | ICD-10-CM

## 2018-11-13 DIAGNOSIS — E1142 Type 2 diabetes mellitus with diabetic polyneuropathy: Secondary | ICD-10-CM | POA: Diagnosis not present

## 2018-11-13 NOTE — Telephone Encounter (Addendum)
"  He said he's not going to have his surgery.  He refuses to have the Covid test done.  He said he's not going to have his surgery."   I am calling you in regards to your surgery that's scheduled for November 18, 2018.  "That has been canceled.  I've already made an appointment to see Dr. March Rummage for November.  He's is going to check my toe again on November 29 and we'll make a decision then."  Okay, that's all I needed to know."  I called [employer] crew and spoke to Magdalena.  I canceled his surgery for 11/18/2018.    I also called Langley Gauss at [facility] and informed her of the cancellation.

## 2018-11-16 IMAGING — DX DG CHEST 1V PORT
1 series · 1 of 1 positions shown · non-contrast
Comparison: 03/08/2017

CLINICAL DATA: Follow-up coronary bypass grafting

EXAM:
PORTABLE CHEST 1 VIEW

[chest ap]
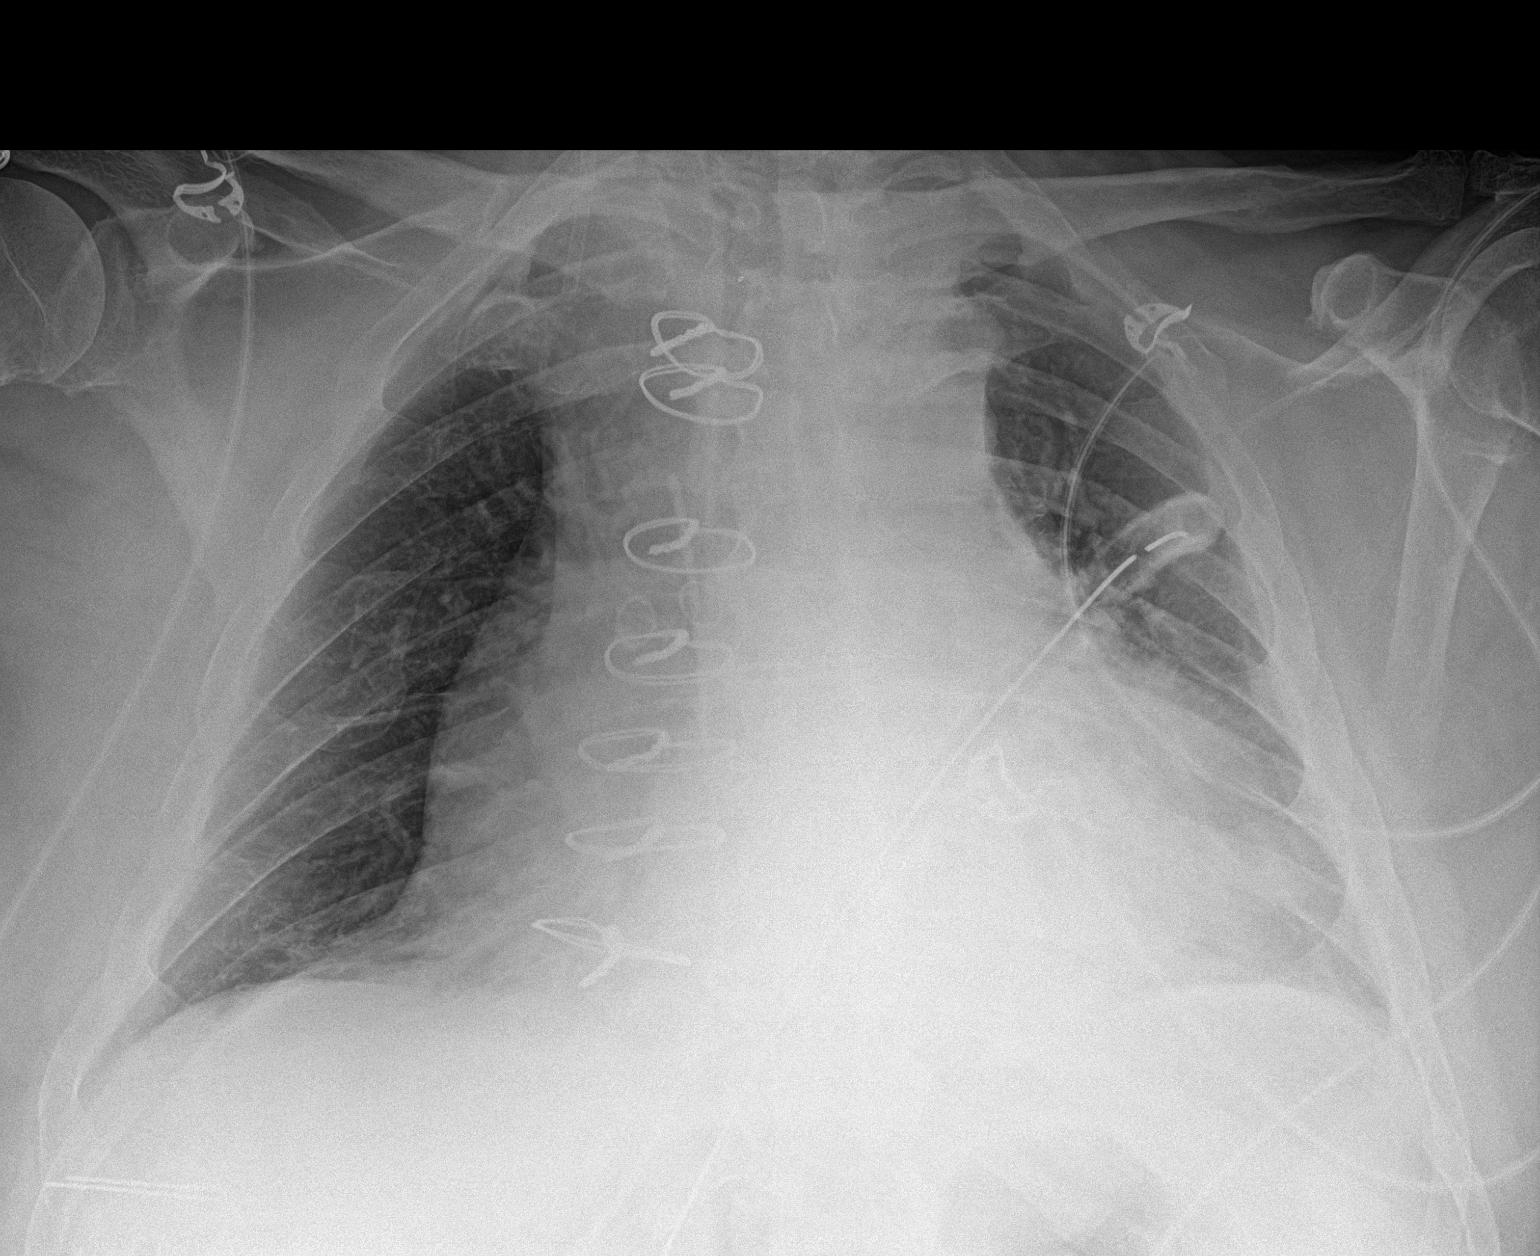

[1 of 1 positions shown; findings below may reference images not displayed]

FINDINGS: Cardiac shadow remains enlarged. Postsurgical changes are again
identified. Swan-Ganz catheter has been removed in the interval.
Left thoracostomy catheter and mediastinal drain remain. No
pneumothorax is noted. Minimal right basilar atelectasis is seen.
IMPRESSION: Tubes and lines as described.

Minimal right basilar atelectasis.

## 2018-11-17 ENCOUNTER — Other Ambulatory Visit: Payer: Self-pay

## 2018-11-17 ENCOUNTER — Ambulatory Visit: Payer: PPO | Attending: Family Medicine | Admitting: Neurology

## 2018-11-17 DIAGNOSIS — Z79899 Other long term (current) drug therapy: Secondary | ICD-10-CM | POA: Insufficient documentation

## 2018-11-17 DIAGNOSIS — G4733 Obstructive sleep apnea (adult) (pediatric): Secondary | ICD-10-CM | POA: Diagnosis not present

## 2018-11-17 DIAGNOSIS — Z7982 Long term (current) use of aspirin: Secondary | ICD-10-CM | POA: Insufficient documentation

## 2018-11-17 DIAGNOSIS — Z7984 Long term (current) use of oral hypoglycemic drugs: Secondary | ICD-10-CM | POA: Insufficient documentation

## 2018-11-17 DIAGNOSIS — R0683 Snoring: Secondary | ICD-10-CM

## 2018-11-17 DIAGNOSIS — G4730 Sleep apnea, unspecified: Secondary | ICD-10-CM

## 2018-11-17 IMAGING — CR DG CHEST 2V
2 series · 2 of 2 positions shown · non-contrast
Comparison: 03/09/2017

CLINICAL DATA: Coronary bypass

EXAM:
CHEST  2 VIEW

[chest pa]
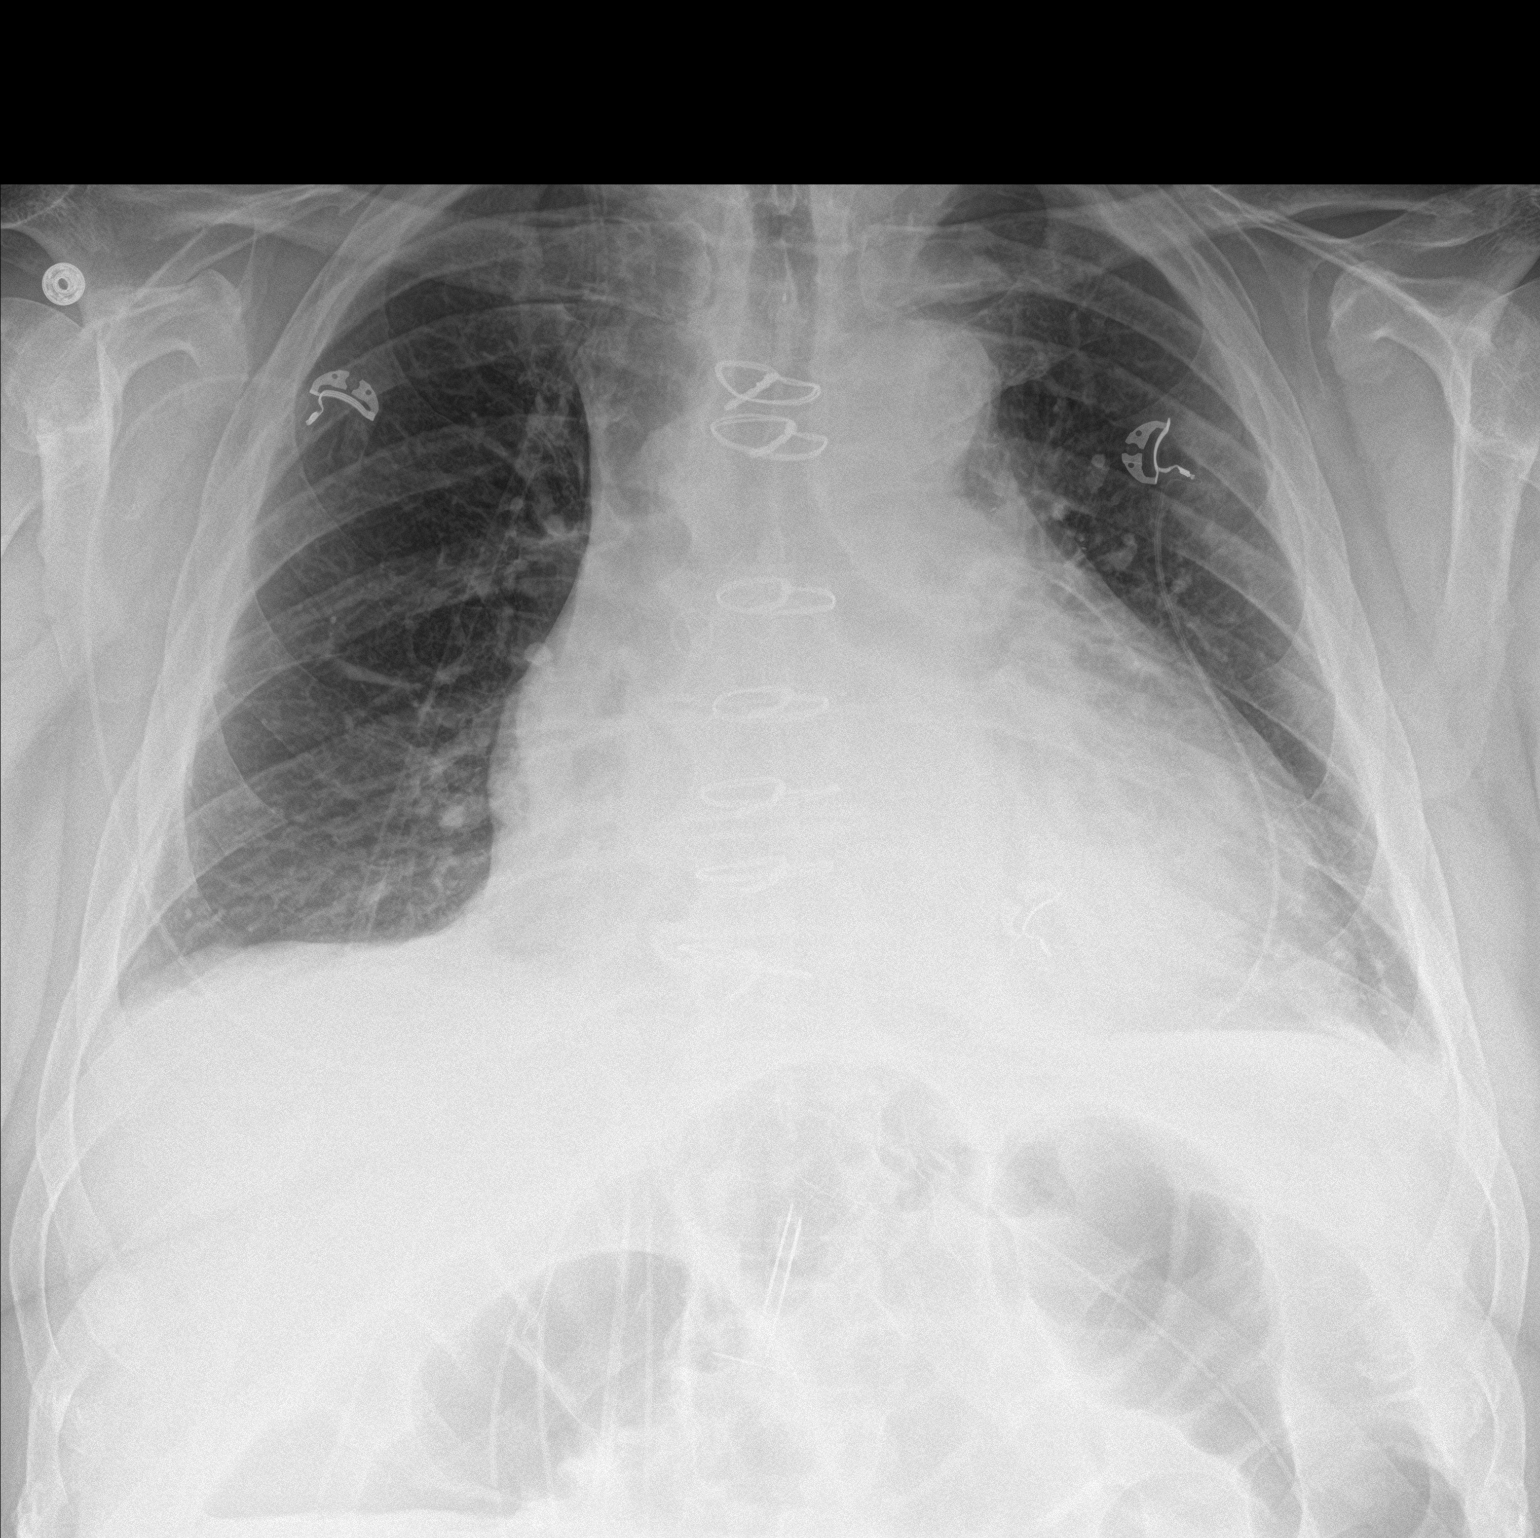

[chest lat]
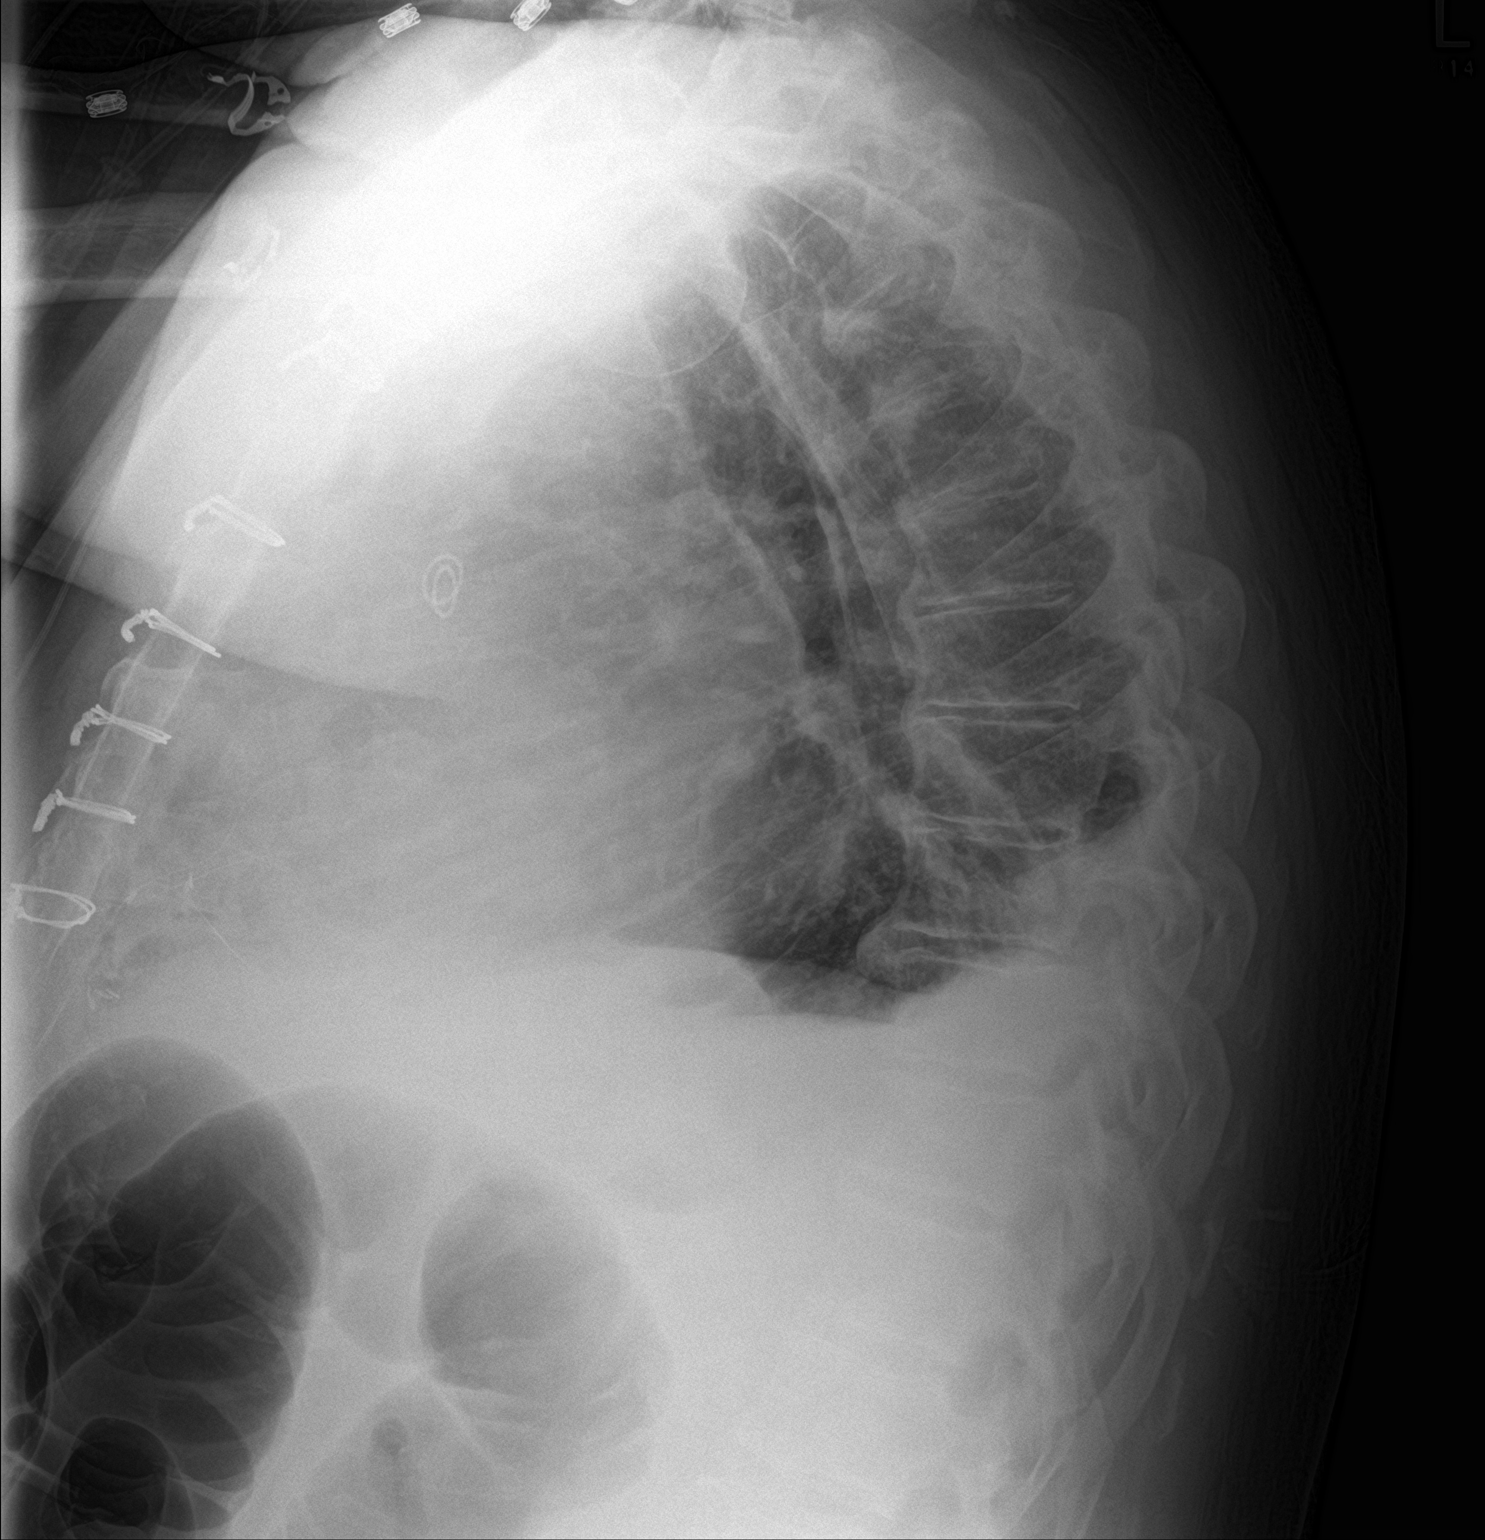

[2 of 2 positions shown; findings below may reference images not displayed]

FINDINGS: Mediastinal drain, left chest tube, and right IJ vascular sheath all
removed. Stable cardiomegaly with improving basilar atelectasis.
Trace pleural effusions. No new collapse or consolidation. No
pneumothorax. Trachea is midline.
IMPRESSION: Stable cardiomegaly with vascular congestion and basilar
atelectasis.

Trace pleural effusions.

## 2018-11-18 ENCOUNTER — Ambulatory Visit: Payer: PPO | Admitting: Physician Assistant

## 2018-11-18 ENCOUNTER — Other Ambulatory Visit: Payer: PPO

## 2018-11-18 ENCOUNTER — Encounter (HOSPITAL_BASED_OUTPATIENT_CLINIC_OR_DEPARTMENT_OTHER): Admission: RE | Payer: Self-pay | Source: Home / Self Care

## 2018-11-18 ENCOUNTER — Ambulatory Visit (HOSPITAL_BASED_OUTPATIENT_CLINIC_OR_DEPARTMENT_OTHER): Admission: RE | Admit: 2018-11-18 | Payer: PPO | Source: Home / Self Care | Admitting: Podiatry

## 2018-11-18 DIAGNOSIS — R05 Cough: Secondary | ICD-10-CM | POA: Diagnosis not present

## 2018-11-18 DIAGNOSIS — J309 Allergic rhinitis, unspecified: Secondary | ICD-10-CM | POA: Diagnosis not present

## 2018-11-18 SURGERY — EXCISION, METATARSAL BONE, HEAD
Anesthesia: General | Laterality: Right

## 2018-11-21 NOTE — Procedures (Signed)
   [place] A. Merlene Laughter, MD     [email or web address]             HOME SLEEP STUDY  LOCATION: [place]  Patient Name: Travis Blair, Travis Blair Date: 11/17/2018 Gender: Male D.O.B: 02-Sep-1950 Age (years): 59 Referring Provider: Faustino Congress FNP Height (inches): 51 Interpreting Physician: Phillips Odor MD, ABSM Weight (lbs): 310 RPSGT: Peak, Robert BMI: 40 MRN: [id number] Neck Size: CLINICAL INFORMATION Sleep Study Type: HST     Indication for sleep study: Snoring     Epworth Sleepiness Score: NA  SLEEP STUDY TECHNIQUE A multi-channel overnight portable sleep study was performed. The channels recorded were: nasal airflow, thoracic respiratory movement, and oxygen saturation with a pulse oximetry. Snoring was also monitored.  MEDICATIONS Patient self administered medications include: N/A.  Current Outpatient Medications:  .  aspirin EC 81 MG tablet, Take 81 mg by mouth daily., Disp: , Rfl:  .  atorvastatin (LIPITOR) 40 MG tablet, Take 1 tablet (40 mg total) by mouth daily., Disp: 90 tablet, Rfl: 3 .  ezetimibe (ZETIA) 10 MG tablet, Take 1 tablet (10 mg total) by mouth daily., Disp: 90 tablet, Rfl: 3 .  fluorouracil (EFUDEX) 5 % cream, , Disp: , Rfl:  .  glipiZIDE (GLUCOTROL XL) 10 MG 24 hr tablet, TAKE 1 TABLET BY MOUTH EVERY DAY IN THE MORNING (Patient taking differently: 5 mg. ), Disp: 90 tablet, Rfl: 0 .  lisinopril (ZESTRIL) 5 MG tablet, , Disp: , Rfl:  .  Multiple Vitamin (MULTIVITAMIN) tablet, Take 1 tablet by mouth daily., Disp: , Rfl:  .  sulfamethoxazole-trimethoprim (BACTRIM DS) 800-160 MG tablet, , Disp: , Rfl:  .  torsemide (DEMADEX) 20 MG tablet, Take 1 tablet every other day., Disp: 30 tablet, Rfl: 11   SLEEP ARCHITECTURE Patient was studied for 218 minutes. The sleep efficiency was 40.2 % and the patient was supine for 38.9%. The arousal index was 0.0 per hour.  RESPIRATORY PARAMETERS The overall AHI was 33.0 per hour,  with a central apnea index of 0.6 per hour.  The oxygen nadir was 80% during sleep.     CARDIAC DATA Mean heart rate during sleep was 66.4 bpm.  IMPRESSIONS Severe obstructive sleep apnea occurred during this study (AHI = 33.0/h).  AutoPAP 7-15 is recommended.   Delano Metz, MD Diplomate, American Board of Sleep Medicine.  ELECTRONICALLY SIGNED ON:  11/21/2018, 5:06 PM [place] PH: [phone]   FX: [phone] [place]

## 2018-11-23 ENCOUNTER — Ambulatory Visit: Payer: PPO | Admitting: Podiatry

## 2018-11-26 ENCOUNTER — Other Ambulatory Visit: Payer: PPO

## 2018-12-04 DIAGNOSIS — H00013 Hordeolum externum right eye, unspecified eyelid: Secondary | ICD-10-CM | POA: Diagnosis not present

## 2018-12-10 ENCOUNTER — Ambulatory Visit: Payer: PPO | Admitting: Podiatry

## 2018-12-12 NOTE — Progress Notes (Signed)
Subjective:  Patient ID: Travis Blair, male    DOB: 1950/03/21,  MRN: [id number]  Chief Complaint  Patient presents with  . Hammer Toe    Right 2nd hammertoe abbrasion  . Nail Problem    Nail trim 1-5 bilateral    68 y.o. male presents with the above complaint.  Here for routine foot care.  Still having problems with the second toe rubbing on the right foot but has to hold off on surgical admission at this time  Review of Systems: Negative except as noted in the HPI. Denies N/V/F/Ch.  Past Medical History:  Diagnosis Date  . Anemia associated with chronic renal failure basically  . Asthma    as a child  . Chronic kidney disease    Sees Dr [name]  . Coronary artery disease   . Diabetes mellitus without complication ([place])   . DVT (deep venous thrombosis) ([place])   . Hyperlipidemia   . Hypertension   . Myocardial infarction ([place])   . Obesity   . Pneumonia 2018  . Sleep apnea     Current Outpatient Medications:  .  aspirin EC 81 MG tablet, Take 81 mg by mouth daily., Disp: , Rfl:  .  atorvastatin (LIPITOR) 40 MG tablet, Take 1 tablet (40 mg total) by mouth daily., Disp: 90 tablet, Rfl: 3 .  ezetimibe (ZETIA) 10 MG tablet, Take 1 tablet (10 mg total) by mouth daily., Disp: 90 tablet, Rfl: 3 .  fluorouracil (EFUDEX) 5 % cream, , Disp: , Rfl:  .  glipiZIDE (GLUCOTROL XL) 10 MG 24 hr tablet, TAKE 1 TABLET BY MOUTH EVERY DAY IN THE MORNING (Patient taking differently: 5 mg. ), Disp: 90 tablet, Rfl: 0 .  lisinopril (ZESTRIL) 5 MG tablet, , Disp: , Rfl:  .  Multiple Vitamin (MULTIVITAMIN) tablet, Take 1 tablet by mouth daily., Disp: , Rfl:  .  sulfamethoxazole-trimethoprim (BACTRIM DS) 800-160 MG tablet, , Disp: , Rfl:  .  torsemide (DEMADEX) 20 MG tablet, Take 1 tablet every other day., Disp: 30 tablet, Rfl: 11  Social History   Tobacco Use  Smoking Status Never Smoker  Smokeless Tobacco Never Used    No Known Allergies Objective:  There were no vitals filed for this  visit. There is no height or weight on file to calculate BMI. Constitutional Well developed. Well nourished.  Vascular Dorsalis pedis pulses palpable bilaterally. Posterior tibial pulses non-palpable bilaterally. Capillary refill normal to all digits.  No cyanosis or clubbing noted. Pedal hair growth normal.  Neurologic Normal speech. Oriented to person, place, and time. Epicritic sensation to light touch grossly present bilaterally. Protective sensation absent bilat.  Dermatologic Nails elongated and thickened. No skin lesions. Hyperkeratotic lesion right sub-met 5 Skin irritation dorsal second PIPJ  Orthopedic: Normal joint ROM without pain or crepitus bilaterally. Rigid right second hammertoe deformity No bony tenderness.   Radiographs: None  Assessment:   1. Onychomycosis of multiple toenails with type 2 diabetes mellitus and peripheral neuropathy ([place])    Plan:  Patient was evaluated and treated and all questions answered.   Diabetes with PAD, Onychomycosis -Routine foot care as below:  Procedure: Nail Debridement Rationale: Patient meets criteria for routine foot care due to PAD Type of Debridement: manual, sharp debridement. Instrumentation: Nail nipper, rotary burr. Number of Nails: 10  Right second toe abrasion -Dispensed Toe educated on use    Right foot prominent fifth metatarsal head, capsulitis, right second hammertoe -Patient wishes to hold off surgical invention the last time.  No follow-ups on file.

## 2018-12-15 DIAGNOSIS — G4733 Obstructive sleep apnea (adult) (pediatric): Secondary | ICD-10-CM | POA: Diagnosis not present

## 2018-12-16 DIAGNOSIS — H00012 Hordeolum externum right lower eyelid: Secondary | ICD-10-CM | POA: Diagnosis not present

## 2018-12-21 ENCOUNTER — Other Ambulatory Visit: Payer: PPO | Admitting: *Deleted

## 2018-12-21 ENCOUNTER — Ambulatory Visit: Payer: PPO | Admitting: Physician Assistant

## 2018-12-21 ENCOUNTER — Encounter: Payer: Self-pay | Admitting: Physician Assistant

## 2018-12-21 ENCOUNTER — Other Ambulatory Visit: Payer: Self-pay

## 2018-12-21 VITALS — BP 172/90 | HR 81 | Ht 74.0 in | Wt 325.0 lb

## 2018-12-21 DIAGNOSIS — I5032 Chronic diastolic (congestive) heart failure: Secondary | ICD-10-CM

## 2018-12-21 DIAGNOSIS — E782 Mixed hyperlipidemia: Secondary | ICD-10-CM

## 2018-12-21 DIAGNOSIS — I10 Essential (primary) hypertension: Secondary | ICD-10-CM | POA: Diagnosis not present

## 2018-12-21 DIAGNOSIS — I2510 Atherosclerotic heart disease of native coronary artery without angina pectoris: Secondary | ICD-10-CM | POA: Diagnosis not present

## 2018-12-21 DIAGNOSIS — I739 Peripheral vascular disease, unspecified: Secondary | ICD-10-CM | POA: Diagnosis not present

## 2018-12-21 DIAGNOSIS — N1832 Chronic kidney disease, stage 3b: Secondary | ICD-10-CM | POA: Diagnosis not present

## 2018-12-21 MED ORDER — CARVEDILOL 6.25 MG PO TABS: 6.2500 mg | ORAL_TABLET | Freq: Two times a day (BID) | ORAL | 1 refills | Status: DC

## 2018-12-21 NOTE — Progress Notes (Signed)
Cardiology Office Note:    Date:  12/21/2018   ID:  Travis Blair, DOB Nov 25, 1950, MRN [id number]  PCP:  [name], MD  Cardiologist:  [name], MD  Electrophysiologist:  None  Nephrologist: [name], MD  Referring MD: [name], MD   Chief Complaint  Patient presents with  . Follow-up    CAD, HTN, HLP    History of Present Illness:    Travis Blair is a 68 y.o. male with:   Coronary artery disease ? S/p CABG 02/2017 (post op AFib >> Amiodarone)  Diastolic CHF  Hx of DVT ? Tx w/ anticoagulation with Eliquis  Chronic kidney disease  Hypertension   Hyperlipidemia  ? Rosuvastatin caused leg cramps  Diabetes Mellitus  OSA  Mr. Travis Blair was last seen via telemedicine in June 2020.  Since that time, ezetimibe was added to his medical regimen for uncontrolled lipids.  He did have follow-up lipids in September with his primary care physician.  His triglycerides were improved.  He also notes that his nephrologist took him off of torsemide.  His creatinine has improved.  He has not had chest discomfort, significant shortness of breath, syncope, orthopnea.  He sleeps with CPAP.  He has chronic lower extremity swelling.  He notes his swelling has actually improved since coming off of torsemide.  Prior CV studies:   The following studies were reviewed today:  Carotid US 03/04/17 Bilateral ICA 1-39  Cardiac Catheterization 12/20/16 LAD prox 90, 95, 60 LCx prox 60, 99, 95; OM1 70 RCA mid 95, 95, dist 60; RPDA ost 60; RPAVB 90, side branch 90  Myoview 12/02/16 Lg infarct with mod peri-infarct ischemia in RCA/LCx territory; EF 41 High Risk study  Echo 11/17/16 Mild LVH, EF 50-55, no RWMA, Gr 2 DD, mild LAE, mod RVE, severe RAE   Past Medical History:  Diagnosis Date  . Anemia associated with chronic renal failure basically  . Asthma    as a child  . Chronic kidney disease    Sees Dr [name]  . Coronary artery disease   . Diabetes mellitus  without complication ([name])   . DVT (deep venous thrombosis) ([name])   . Hyperlipidemia   . Hypertension   . Myocardial infarction ([place])   . Obesity   . Pneumonia 2018  . Sleep apnea    Surgical Hx: The patient  has a past surgical history that includes Hand surgery; RIGHT/LEFT HEART CATH AND CORONARY ANGIOGRAPHY (N/A, 12/20/2016); Colonoscopy; Tonsillectomy; Coronary artery bypass graft (N/A, 03/07/2017); and TEE without cardioversion (N/A, 03/07/2017).   Current Medications: Current Meds  Medication Sig  . amoxicillin-clavulanate (AUGMENTIN) 500-125 MG tablet Take 1 tablet by mouth daily.  Marland Kitchen aspirin EC 81 MG tablet Take 81 mg by mouth daily.  Marland Kitchen atorvastatin (LIPITOR) 40 MG tablet Take 1 tablet (40 mg total) by mouth daily.  Marland Kitchen ezetimibe (ZETIA) 10 MG tablet Take 1 tablet (10 mg total) by mouth daily.  . fluorouracil (EFUDEX) 5 % cream   . glipiZIDE (GLUCOTROL) 5 MG tablet Take 5 mg by mouth daily before breakfast.  . Multiple Vitamin (MULTIVITAMIN) tablet Take 1 tablet by mouth daily.  . [DISCONTINUED] sulfamethoxazole-trimethoprim (BACTRIM DS) 800-160 MG tablet   . [DISCONTINUED] torsemide (DEMADEX) 20 MG tablet Take 1 tablet every other day.     Allergies:   Patient has no known allergies.   Social History   Tobacco Use  . Smoking status: Never Smoker  . Smokeless tobacco: Never Used  Substance Use  Topics  . Alcohol use: No  . Drug use: No     Family Hx: The patient's family history includes Cancer in his father, mother, and another family member; Heart disease in his father, mother, and another family member; Hyperlipidemia in an other family member; Hypertension in an other family member; Lymphoma in his sister.  ROS:   Please see the history of present illness.    Review of Systems  Cardiovascular: Positive for claudication (he notes bilat thigh burning with walking 1/2 mile).   All other systems reviewed and are negative.   EKGs/Labs/Other Test Reviewed:    EKG:   EKG is  ordered today.  The ekg ordered today demonstrates normal sinus rhythm, heart rate 81, normal axis, inferior Q waves with T wave inversions 2, 3, aVF, V6, anterior Q waves, QTC 464, no change from prior tracing  Recent Labs: 04/07/2018: Hemoglobin 12.8 09/21/2018: ALT 17; BUN 36; Creatinine, Ser 2.38; Potassium 5.6; Sodium 134   Labs from PCP 10/20/2018: Hemoglobin 12.1, BUN 34, creatinine 1.96, glucose 191, potassium 5.6, ALT 15, total cholesterol 135, triglycerides 179, HDL 35, LDL 70  Recent Lipid Panel Lab Results  Component Value Date/Time   CHOL 211 (H) 09/21/2018 07:28 AM   TRIG 350 (H) 09/21/2018 07:28 AM   HDL 34 (L) 09/21/2018 07:28 AM   CHOLHDL 6.2 (H) 09/21/2018 07:28 AM   LDLCALC 107 (H) 09/21/2018 07:28 AM   LDLDIRECT 122 (H) 09/21/2018 07:28 AM       Physical Exam:    VS:  BP (!) 172/90   Pulse 81   Ht 6\' 2"  (1.88 m)   Wt (!) 325 lb (147.4 kg)   SpO2 97%   BMI 41.73 kg/m     Wt Readings from Last 3 Encounters:  12/21/18 (!) 325 lb (147.4 kg)  07/28/18 (!) 315 lb (142.9 kg)  02/24/18 (!) 310 lb (140.6 kg)     Physical Exam  Constitutional: He is oriented to person, place, and time. He appears well-developed and well-nourished. No distress.  HENT:  Head: Normocephalic and atraumatic.  Eyes: No scleral icterus.  Neck: No JVD present. No thyromegaly present.  Cardiovascular: Normal rate, regular rhythm and normal heart sounds.  No murmur heard. Pulmonary/Chest: Effort normal and breath sounds normal. He has no rales.  Abdominal: Soft. There is no hepatomegaly.  Musculoskeletal:        General: Edema (2+ bilat LE edema) present.  Lymphadenopathy:    He has no cervical adenopathy.  Neurological: He is alert and oriented to person, place, and time.  Skin: Skin is warm and dry.  Psychiatric: He has a normal mood and affect.    ASSESSMENT & PLAN:    1. Coronary artery disease involving native coronary artery of native heart without angina pectoris  History of CABG in June 2019.    He is currently doing well without anginal symptoms.  Continue aspirin, statin.  2. Chronic diastolic heart failure (HCC) EF normal by echocardiogram October 2018.  As noted, his nephrologist took him off torsemide.  Since that time, he has not had any worsening volume status.  His lower extremity swelling is actually improved since coming off of torsemide.  3. Stage 3b chronic kidney disease Followed by nephrology.  Most recent creatinine improved to 1.96.  4. Essential hypertension Blood pressure uncontrolled.  This may be worse since he is off of torsemide.  I will place him on Coreg 6.25 mg twice daily.  I have asked him to monitor  his blood pressure over the next couple weeks and send me those readings for review.  5. Mixed hyperlipidemia Most recent lipid panel with primary care demonstrates improved triglycerides and total cholesterol.  However, I cannot obtain the LDL.  Therefore, we will request labs from primary care.  If his LDL remains >70, I will refer him to the lipid clinic for possible PCSK9 inhibitor.  Of note, he was intolerant to rosuvastatin.  Addendum: I received his labs from primary care.  His LDL in September was 70.  Therefore, I will continue his current regimen.  If, over time, his LDL increases, we will need to reconsider PCSK9 inhibitor therapy.  6. Claudication [facility]) He notes bilateral thigh burning with ambulation.  ABIs prior to his bypass surgery in 2019 were normal.  I will obtain ABIs and arterial Dopplers.  If these are normal, refer back to primary care for evaluation of other causes.   Dispo:  Return in about 3 months (around 03/23/2019) for Routine Follow Up, w/ Dr. Acie Fredrickson, (virtual or in-person).   Medication Adjustments/Labs and Tests Ordered: Current medicines are reviewed at length with the patient today.  Concerns regarding medicines are outlined above.  Tests Ordered: Orders Placed This Encounter  Procedures  .  EKG 12-Lead  . VAS [place] ABI WITH/WO TBI  . VAS [place] LOWER EXTREMITY ARTERIAL DUPLEX   Medication Changes: Meds ordered this encounter  Medications  . carvedilol (COREG) 6.25 MG tablet    Sig: Take 1 tablet (6.25 mg total) by mouth 2 (two) times daily with a meal.    Dispense:  180 tablet    Refill:  1    Signed, Richardson Dopp, PA-C  12/21/2018 4:52 PM    [facility] [address], [place], [place]  [address] Phone: [phone]; Fax: [phone]

## 2018-12-21 NOTE — Patient Instructions (Signed)
Medication Instructions:   Your physician has recommended you make the following change in your medication:   1) Start Carvedilol 6.25MG , 1 tablet by mouth twice a day  *If you need a refill on your cardiac medications before your next appointment, please call your pharmacy*  Lab Work:  None ordered today  Testing/Procedures:  Your physician has requested that you have a lower extremity arterial duplex. This test is an ultrasound of the arteries in the legs or arms. It looks at arterial blood flow in the legs and arms. Allow one hour for Lower and Upper Arterial scans. There are no restrictions or special instructions  Your physician has requested that you have an ankle brachial index (ABI). During this test an ultrasound and blood pressure cuff are used to evaluate the arteries that supply the arms and legs with blood. Allow thirty minutes for this exam. There are no restrictions or special instructions.  Follow-Up:  On 03/23/2019 at 9:00AM with Cleatrice Burke, MD    Other Instructions  Check your blood pressure once a day for 2 weeks, call or send my chart message with blood pressure readings.

## 2018-12-25 ENCOUNTER — Other Ambulatory Visit: Payer: Self-pay

## 2018-12-25 ENCOUNTER — Ambulatory Visit (INDEPENDENT_AMBULATORY_CARE_PROVIDER_SITE_OTHER): Payer: PPO

## 2018-12-25 ENCOUNTER — Ambulatory Visit: Payer: PPO | Admitting: Podiatry

## 2018-12-25 DIAGNOSIS — M2041 Other hammer toe(s) (acquired), right foot: Secondary | ICD-10-CM

## 2018-12-25 DIAGNOSIS — M21961 Unspecified acquired deformity of right lower leg: Secondary | ICD-10-CM

## 2018-12-28 DIAGNOSIS — Z23 Encounter for immunization: Secondary | ICD-10-CM | POA: Diagnosis not present

## 2018-12-28 DIAGNOSIS — H05223 Edema of bilateral orbit: Secondary | ICD-10-CM | POA: Diagnosis not present

## 2019-01-01 ENCOUNTER — Telehealth: Payer: Self-pay | Admitting: *Deleted

## 2019-01-01 NOTE — Telephone Encounter (Signed)
I attempted to call Mr. Travis Blair.  I left him a message informing him we received his history and physical (H&P) form from Dr. Wende Neighbors for his appointment that is scheduled for March 10, 2019.  I informed him that he needs to see Dr. Edwyna Ready for an appointment between February 07, 2019 to March 10, 2019.  The H&P has to be completed within 30 days of the scheduled appointment.  I asked him to call me back.

## 2019-01-14 DIAGNOSIS — G4733 Obstructive sleep apnea (adult) (pediatric): Secondary | ICD-10-CM | POA: Diagnosis not present

## 2019-01-15 DIAGNOSIS — J019 Acute sinusitis, unspecified: Secondary | ICD-10-CM | POA: Diagnosis not present

## 2019-01-21 NOTE — Telephone Encounter (Signed)
I tried to call you previously to let you know we received your history and physical form from Dr. Wende Neighbors.  It was done too soon.  You must see your doctor within a 30 day period of your surgery date and the forms completed during that time period which is February 07, 2019 to March 10, 2019.  "Okay, I'll just get him to do it again."  Would you like me to mail you new forms?  "Yes, if you don't mind, that would be great."  I'll put them in the mail.  I mailed Travis Blair' his history and physical form.

## 2019-02-08 DIAGNOSIS — J309 Allergic rhinitis, unspecified: Secondary | ICD-10-CM | POA: Diagnosis not present

## 2019-02-14 DIAGNOSIS — G4733 Obstructive sleep apnea (adult) (pediatric): Secondary | ICD-10-CM | POA: Diagnosis not present

## 2019-02-15 DIAGNOSIS — H00012 Hordeolum externum right lower eyelid: Secondary | ICD-10-CM | POA: Diagnosis not present

## 2019-02-15 DIAGNOSIS — H10411 Chronic giant papillary conjunctivitis, right eye: Secondary | ICD-10-CM | POA: Diagnosis not present

## 2019-02-17 ENCOUNTER — Other Ambulatory Visit: Payer: Self-pay

## 2019-02-17 ENCOUNTER — Ambulatory Visit (HOSPITAL_COMMUNITY)
Admission: RE | Admit: 2019-02-17 | Discharge: 2019-02-17 | Disposition: A | Discharge status: Home / Self Care | Payer: PPO | Source: Ambulatory Visit | Attending: Cardiology | Admitting: Cardiology

## 2019-02-17 DIAGNOSIS — I739 Peripheral vascular disease, unspecified: Secondary | ICD-10-CM | POA: Diagnosis not present

## 2019-02-18 ENCOUNTER — Ambulatory Visit (INDEPENDENT_AMBULATORY_CARE_PROVIDER_SITE_OTHER): Payer: PPO | Admitting: Podiatry

## 2019-02-18 DIAGNOSIS — E1142 Type 2 diabetes mellitus with diabetic polyneuropathy: Secondary | ICD-10-CM

## 2019-02-18 DIAGNOSIS — B351 Tinea unguium: Secondary | ICD-10-CM

## 2019-02-18 DIAGNOSIS — E1169 Type 2 diabetes mellitus with other specified complication: Secondary | ICD-10-CM | POA: Diagnosis not present

## 2019-02-18 NOTE — Progress Notes (Signed)
  Subjective:  Patient ID: Travis Blair, male    DOB: [birth year],  MRN: [id number]  Chief Complaint  Patient presents with  . Nail Problem    3 month  follow up, bilateral thick and painful toenails    69 y.o. male presents with the above complaint. History confirmed with patient. Hx as above.  Objective:  Physical Exam: warm, good capillary refill, nail exam onychomycosis of the toenails, no trophic changes or ulcerative lesions, normal DP and PT pulses, and absent protective sensation. Left Foot: normal exam, no swelling, tenderness, instability; ligaments intact, full range of motion of all ankle/foot joints  Right Foot: normal exam, no swelling, tenderness, instability; ligaments intact, full range of motion of all ankle/foot joints   No images are attached to the encounter.  Assessment:   1. Onychomycosis of multiple toenails with type 2 diabetes mellitus and peripheral neuropathy ([place])      Plan:  Patient was evaluated and treated and all questions answered.  Onychomycosis, Diabetes and DPN -Patient is diabetic with a qualifying condition for at risk foot care.  Procedure: Nail Debridement Rationale: Patient meets criteria for routine foot care due to DPN Type of Debridement: manual, sharp debridement. Instrumentation: Nail nipper, rotary burr. Number of Nails: 10    No follow-ups on file.   MDM

## 2019-02-19 ENCOUNTER — Other Ambulatory Visit: Payer: Self-pay

## 2019-02-19 DIAGNOSIS — I739 Peripheral vascular disease, unspecified: Secondary | ICD-10-CM

## 2019-02-26 ENCOUNTER — Telehealth: Payer: Self-pay | Admitting: Podiatry

## 2019-02-26 NOTE — Telephone Encounter (Signed)
I'm scheduled to have surgery on 01/27. At this point I want to wait until after I have received the [place] vaccination. Please call me back to confirm you received my message to cancel surgery for now. Thank you.

## 2019-02-26 NOTE — Telephone Encounter (Signed)
Left voicemail for pt letting him know I cancelled his sx scheduled for 01/27. Told him to call and schedule an appointment if he needs any conservative treatment before rescheduling his sx.

## 2019-03-05 ENCOUNTER — Other Ambulatory Visit: Payer: Self-pay

## 2019-03-05 ENCOUNTER — Ambulatory Visit (INDEPENDENT_AMBULATORY_CARE_PROVIDER_SITE_OTHER): Payer: PPO | Admitting: Allergy & Immunology

## 2019-03-05 ENCOUNTER — Encounter: Payer: Self-pay | Admitting: Allergy & Immunology

## 2019-03-05 VITALS — BP 172/82 | HR 78 | Temp 97.7°F | Resp 18 | Ht 74.0 in | Wt 331.0 lb

## 2019-03-05 DIAGNOSIS — J310 Chronic rhinitis: Secondary | ICD-10-CM

## 2019-03-05 DIAGNOSIS — J301 Allergic rhinitis due to pollen: Secondary | ICD-10-CM | POA: Diagnosis not present

## 2019-03-05 NOTE — Progress Notes (Signed)
NEW PATIENT  Date of Service/Encounter:  03/05/19  Referring provider: [name], MD   Assessment:   Seasonal allergic rhinitis due to pollen   Chronic sinusitis - now cleared s/p three rounds of antibiotics and prednisone    Mr. Mustard presents for an allergic rhinitis evaluation. Unfortunately, testing today is positive only to tree pollens on intradermal testing. This points against an allergic trigger. Tree pollen would not explain fall symptoms. He is doing fine without regular medication use, therefore I do not think that additional workup is warranted at this time. He is comfortable with this plan, but I did recommend an ENT evaluation if the symptoms come back.    Plan/Recommendations:    1. Chronic rhinitis  - Testing was positive only to trees. - This definitely would not explain your symptoms in September, however. - I do not think that this is related to your current symptoms. - I would see ENT if this continues to be a problem.  2. Return if symptoms worsen or fail to improve. This can be an in-person, a virtual Webex or a telephone follow up visit.   Subjective:   Travis Blair is a 69 y.o. male presenting today for evaluation of  Chief Complaint  Patient presents with  . Allergic Rhinitis   . Sinus Problem    Travis Blair has a history of the following: Patient Active Problem List   Diagnosis Date Noted  . Hammer toe of right foot 12/25/2018  . Deformity of metatarsal bone of right foot 12/25/2018  . Coronary artery disease involving native coronary artery of native heart without angina pectoris 07/27/2018  . Essential hypertension 07/27/2018  . S/P CABG x 3 03/07/2017  . Abnormal stress test 12/18/2016  . Right atrial dilatation 11/17/2016  . Right ventricular dilation 11/17/2016  . Near syncope 11/16/2016  . Uncontrolled type 2 diabetes mellitus with hyperglycemia, without long-term current use of insulin ([place]) 11/16/2016  .  Type 2 diabetes mellitus with hyperlipidemia ([place]) 11/16/2016  . Hyperlipidemia, unspecified 11/16/2016  . Obesity 11/16/2016  . Hyponatremia 11/16/2016  . AKI (acute kidney injury) ([place]) 11/16/2016    History obtained from: chart review and patient.  Travis Blair was referred by [name], MD.     Travis Blair is a 69 y.o. male presenting for an evaluation of chronic rhinitis. He is a previously healthy male presenting due to ongoing chronic sinusitis. He started having symptoms of sinusitis in September. He reports nasal pressure and congestion. He has received three rounds of antibiotics at this time, sometimes accompanied by steroids. He will improve, complete the course, and then feel fine for a period of 3-4 days before symptoms return. He has been on fluticasone nasal spray, although he has not needed this in two weeks. He has also used cetirizine, which does provide relief of symptoms. He has not had a sinus CT and has not seen ENT at all. Overall, since being off of the fluticasone and cetrizine for two weeks, he has felt fairly normal.   Prior to this current episode, Travis Blair does not remember the last time that he needed antibiotics. He certainly has never needed IV antibiotics.   His atopic history is largely unremarkable. He does have an inhaler to use for emergencies, but he only uses it when he has viral URI symptoms. He last used it 2-3 weeks ago. His albuterol typically gets out of date before he uses up an entire MDI. He denies nighttime symptoms  aside from some coughing when he first lays down. This does not interrupt his sleep and is only 1-2 coughs at the most.   He is not new to the area and actually lived in [place] for more than 30 years before retiring to [place]. He is not a smoker. 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      Otherwise, there is no history of other atopic diseases, including food allergies, drug allergies, stinging insect allergies, eczema, urticaria or contact dermatitis. There is no significant infectious history. Vaccinations are up to date.    Past Medical History: Patient Active Problem List   Diagnosis Date Noted  . Hammer toe of right foot 12/25/2018  . Deformity of metatarsal bone of right foot 12/25/2018  . Coronary artery disease involving native coronary artery of native heart without angina pectoris 07/27/2018  . Essential hypertension 07/27/2018  . S/P CABG x 3 03/07/2017  . Abnormal stress test 12/18/2016  . Right atrial dilatation 11/17/2016  . Right ventricular dilation 11/17/2016  . Near syncope 11/16/2016  . Uncontrolled type 2 diabetes mellitus with hyperglycemia, without long-term current use of insulin ([place]) 11/16/2016  . Type 2 diabetes mellitus with hyperlipidemia ([place]) 11/16/2016  . Hyperlipidemia, unspecified 11/16/2016  . Obesity 11/16/2016  . Hyponatremia 11/16/2016  . AKI (acute kidney injury) ([place]) 11/16/2016    Medication List:  Allergies as of 03/05/2019   No Known Allergies     Medication List       Accurate as of March 05, 2019 11:49 AM. If you have any questions, ask your nurse or doctor.        STOP taking these medications   amoxicillin-clavulanate 500-125 MG tablet Commonly known as: AUGMENTIN Stopped by: Valentina Shaggy, MD   fluorouracil 5 % cream Commonly known as: EFUDEX Stopped by: Valentina Shaggy, MD     TAKE these medications   aspirin EC 81 MG tablet Take 81 mg by mouth daily.   atorvastatin 40 MG tablet Commonly known as: LIPITOR Take 1 tablet (40 mg total) by mouth daily.   azelastine 0.1 % nasal spray Commonly known as: ASTELIN Place 2 sprays into both nostrils 2 (two) times daily.   carvedilol 6.25 MG tablet Commonly known as: COREG Take 1 tablet (6.25 mg total) by mouth 2 (two) times daily with a  meal.   cetirizine 10 MG tablet Commonly known as: ZYRTEC Take 10 mg by mouth at bedtime.   doxycycline 100 MG capsule Commonly known as: VIBRAMYCIN   ezetimibe 10 MG tablet Commonly known as: ZETIA Take 1 tablet (10 mg total) by mouth daily.   fluticasone 50 MCG/ACT nasal spray Commonly known as: FLONASE Place 1 spray into both nostrils 2 (two) times daily.   glipiZIDE 5 MG tablet Commonly known as: GLUCOTROL Take 5 mg by mouth daily before breakfast.   levocetirizine 5 MG tablet Commonly known as: XYZAL Take 5 mg by mouth at bedtime.   multivitamin tablet Take 1 tablet by mouth daily.   neomycin-polymyxin b-dexamethasone 3.5-10000-0.1 Susp Commonly known as: MAXITROL   ProAir HFA 108 (90 Base) MCG/ACT inhaler Generic drug: albuterol       Birth History: non-contributory  Developmental History: non-contributory  Past Surgical History: Past Surgical History:  Procedure Laterality Date  . COLONOSCOPY    . CORONARY ARTERY BYPASS GRAFT N/A 03/07/2017   Procedure: CORONARY ARTERY  BYPASS GRAFTING (CABG), X 3 , USING RIGHT INTERNAL MAMMARY ARTERY, AND RIGHT LEG GREATER SAPHENOUS VEIN HARVESTED ENDOSCOPICALLY;  Surgeon: [name], MD;  Location: [facility];  Service: Open Heart Surgery;  Laterality: N/A;  . HAND SURGERY    . RIGHT/LEFT HEART CATH AND CORONARY ANGIOGRAPHY N/A 12/20/2016   Procedure: RIGHT/LEFT HEART CATH AND CORONARY ANGIOGRAPHY;  Surgeon: [name], MD;  Location: [facility];  Service: Cardiovascular;  Laterality: N/A;  . TEE WITHOUT CARDIOVERSION N/A 03/07/2017   Procedure: TRANSESOPHAGEAL ECHOCARDIOGRAM (TEE);  Surgeon: [name], [name], MD;  Location: [facility];  Service: Open Heart Surgery;  Laterality: N/A;  . TONSILLECTOMY       Family History: Family History  Problem Relation Age of Onset  . Cancer Other   . Hypertension Other   . Hyperlipidemia Other   . Heart disease Other   . Cancer Mother   . Heart disease Mother   .  Cancer Father   . Heart disease Father   . Lymphoma Sister      Social History: Dianna lives at home with his family. They live in a house that is 68 year old. There are hardwood and area rugs throughout the home. They have a heat pump for heating and central air for cooling. There are no animals inside or outside of the home. They did have a Yorkie, but they re-homed it because it was requiring a bit of upkeep. They like to travel and did not like the dog cramping their style, as it were.     Review of Systems  Constitutional: Negative.  Negative for chills, fever, malaise/fatigue and weight loss.  HENT: Positive for congestion and sinus pain. Negative for ear discharge, ear pain and sore throat.   Eyes: Negative for pain, discharge and redness.  Respiratory: Negative for cough, sputum production, shortness of breath and wheezing.   Cardiovascular: Negative.  Negative for chest pain and palpitations.  Gastrointestinal: Negative for abdominal pain, constipation, diarrhea, heartburn, nausea and vomiting.  Skin: Negative.  Negative for itching and rash.  Neurological: Negative for dizziness and headaches.  Endo/Heme/Allergies: Negative for environmental allergies. Does not bruise/bleed easily.       Objective:   Blood pressure (!) 172/82, pulse 78, temperature 97.7 F (36.5 C), temperature source Oral, resp. rate 18, height 6\' 2"  (1.88 m), weight (!) 331 lb (150.1 kg), SpO2 98 %. Body mass index is 42.5 kg/m.   Physical Exam:   Physical Exam  Constitutional: He appears well-developed.  HENT:  Head: Normocephalic and atraumatic.  Right Ear: Tympanic membrane, external ear and ear canal normal. No drainage, swelling or tenderness. Tympanic membrane is not injected, not scarred, not erythematous, not retracted and not bulging.  Left Ear: Tympanic membrane, external ear and ear canal normal. No drainage, swelling or tenderness. Tympanic membrane is not injected, not scarred, not  erythematous, not retracted and not bulging.  Nose: No mucosal edema, rhinorrhea, nasal deformity or septal deviation. No epistaxis. Right sinus exhibits no maxillary sinus tenderness and no frontal sinus tenderness. Left sinus exhibits no maxillary sinus tenderness and no frontal sinus tenderness.  Mouth/Throat: Uvula is midline and oropharynx is clear and moist. Mucous membranes are not pale and not dry.  There is clear nasal discharge bridging between the turbinates and the septum. No purulent discharge noted. No polyps appreciated.   Eyes: Pupils are equal, round, and reactive to light. Conjunctivae and EOM are normal. Right eye exhibits no chemosis and no discharge. Left  eye exhibits no chemosis and no discharge. Right conjunctiva is not injected. Left conjunctiva is not injected.  Cardiovascular: Normal rate, regular rhythm and normal heart sounds.  Respiratory: Effort normal and breath sounds normal. No accessory muscle usage. No tachypnea. No respiratory distress. He has no wheezes. He has no rhonchi. He has no rales. He exhibits no tenderness.  Moving air well in all lung fields.  GI: There is no abdominal tenderness. There is no rebound and no guarding.  Lymphadenopathy:       Head (right side): No submandibular, no tonsillar and no occipital adenopathy present.       Head (left side): No submandibular, no tonsillar and no occipital adenopathy present.    He has no cervical adenopathy.  Neurological: He is alert.  Skin: No abrasion, no petechiae and no rash noted. Rash is not papular, not vesicular and not urticarial. No erythema. No pallor.  Psychiatric: He has a normal mood and affect.     Diagnostic studies:     Allergy Studies:    Airborne Adult Perc - 03/05/19 0944    Time Antigen Placed  0944    Allergen Manufacturer  Lavella Hammock    Location  Back    Number of Test  59    Panel 1  Select    1. Control-Buffer 50% Glycerol  Negative    2. Control-Histamine 1 mg/ml  2+    3.  Albumin saline  Negative    4. [place]  Negative    5. [place]  Negative    6. Johnson  Negative    7. [place] Blue  Negative    8. Meadow Fescue  Negative    9. Perennial Rye  Negative    10. Sweet Vernal  Negative    11. Timothy  Negative    12. Cocklebur  Negative    13. Burweed [place]er  Negative    14. Ragweed, short  Negative    15. Ragweed, Giant  Negative    16. Plantain,  English  Negative    17. Lamb's Quarters  Negative    18. Sheep Sorrell  Negative    19. Rough Pigweed  Negative    20. Marsh Elder, Rough  Negative    21. Mugwort, Common  Negative    22. Ash mix  Negative    23. Birch mix  Negative    24. Beech American  Negative    25. Box, Elder  Negative    26. Cedar, red  Negative    27. Cottonwood, [place]  Negative    28. Elm mix  Negative    29. Hickory mix  Negative    30. Maple mix  Negative    31. Oak, [place] mix  Negative    32. Pecan Pollen  Negative    33. Pine mix  Negative    34. Sycamore Eastern  Negative    35. [place], Black Pollen  Negative    36. Alternaria alternata  Negative    37. Cladosporium Herbarum  Negative    38. Aspergillus mix  Negative    39. Penicillium mix  Negative    40. Bipolaris sorokiniana (Helminthosporium)  Negative    41. Drechslera spicifera (Curvularia)  Negative    42. Mucor plumbeus  Negative    43. Fusarium moniliforme  Negative    44. Aureobasidium pullulans (pullulara)  Negative    45. Rhizopus oryzae  Negative    46. Botrytis cinera  Negative    47. Epicoccum  nigrum  Negative    48. Phoma betae  Negative    49. Candida Albicans  Negative    50. Trichophyton mentagrophytes  Negative    51. Mite, D Farinae  5,000 AU/ml  Negative    52. Mite, D Pteronyssinus  5,000 AU/ml  Negative    53. Cat Hair 10,000 BAU/ml  Negative    54.  Dog Epithelia  Negative    55. Mixed Feathers  Negative    56. Horse Epithelia  Negative    57. Cockroach, German  Negative    58. Mouse  Negative    59. Tobacco Leaf  Negative      Intradermal - 03/05/19 1010    Time Antigen Placed  1010    Allergen Manufacturer  [place]    Location  Arm    Number of Test  15    Intradermal  Select    Control  Negative    [place]  Negative    Johnson  Negative    7 Grass  Negative    Ragweed mix  Negative    Weed mix  Negative    Tree mix  2+    Mold 1  Negative    Mold 2  Negative    Mold 3  Negative    Mold 4  Negative    Cat  Negative    Dog  Negative    Cockroach  Negative    Mite mix  Negative       Allergy testing results were read and interpreted by myself, documented by clinical staff.         Salvatore Marvel, MD [facility]

## 2019-03-05 NOTE — Patient Instructions (Addendum)
1. Chronic rhinitis  - Testing was positive only to trees. - This definitely would not explain your symptoms in September, however. - I do not think that this is related to your current symptoms. - I would see ENT if this continues to be a problem.  2. Return if symptoms worsen or fail to improve. This can be an in-person, a virtual Webex or a telephone follow up visit.   Please inform us of any Emergency Department visits, hospitalizations, or changes in symptoms. Call us before going to the ED for breathing or allergy symptoms since we might be able to fit you in for a sick visit. Feel free to contact us anytime with any questions, problems, or concerns.  It was a pleasure to meet you today!  Websites that have reliable patient information: 1. American Academy of Asthma, Allergy, and Immunology: www.aaaai.org 2. Food Allergy Research and Education (FARE): foodallergy.org 3. Mothers of Asthmatics: http://www.asthmacommunitynetwork.org 4. American College of Allergy, Asthma, and Immunology: www.acaai.org   COVID-19 Vaccine Information can be found at: [email or web address].co.uk For questions related to vaccine distribution or appointments, please email [email or web address] or call [phone].     "Like" [place] on Facebook and Instagram for our latest updates!        Make sure you are registered to vote! If you have moved or changed any of your contact information, you will need to get this updated before voting!  In some cases, you MAY be able to register to vote online: [email or web address]    Reducing Pollen Exposure  The American Academy of Allergy, Asthma and Immunology suggests the following steps to reduce your exposure to pollen during allergy seasons.    1. Do not hang sheets or clothing out to dry; pollen may collect on these items. 2. Do not mow lawns or spend time around freshly cut  grass; mowing stirs up pollen. 3. Keep windows closed at night.  Keep car windows closed while driving. 4. Minimize morning activities outdoors, a time when pollen counts are usually at their highest. 5. Stay indoors as much as possible when pollen counts or humidity is high and on windy days when pollen tends to remain in the air longer. 6. Use air conditioning when possible.  Many air conditioners have filters that trap the pollen spores. 7. Use a HEPA room air filter to remove pollen form the indoor air you breathe.

## 2019-03-10 ENCOUNTER — Ambulatory Visit (HOSPITAL_BASED_OUTPATIENT_CLINIC_OR_DEPARTMENT_OTHER): Admit: 2019-03-10 | Payer: PPO | Admitting: Podiatry

## 2019-03-10 ENCOUNTER — Encounter (HOSPITAL_BASED_OUTPATIENT_CLINIC_OR_DEPARTMENT_OTHER): Payer: Self-pay

## 2019-03-10 DIAGNOSIS — M21961 Unspecified acquired deformity of right lower leg: Secondary | ICD-10-CM | POA: Insufficient documentation

## 2019-03-10 DIAGNOSIS — M2041 Other hammer toe(s) (acquired), right foot: Secondary | ICD-10-CM | POA: Insufficient documentation

## 2019-03-10 SURGERY — Surgical Case
Anesthesia: *Unknown

## 2019-03-10 SURGERY — CORRECTION, HAMMER TOE
Anesthesia: Monitor Anesthesia Care | Site: Toe | Laterality: Right

## 2019-03-15 ENCOUNTER — Other Ambulatory Visit (HOSPITAL_COMMUNITY): Payer: Self-pay | Admitting: Physician Assistant

## 2019-03-15 DIAGNOSIS — I739 Peripheral vascular disease, unspecified: Secondary | ICD-10-CM

## 2019-03-17 DIAGNOSIS — G4733 Obstructive sleep apnea (adult) (pediatric): Secondary | ICD-10-CM | POA: Diagnosis not present

## 2019-03-19 ENCOUNTER — Other Ambulatory Visit: Payer: Self-pay

## 2019-03-19 ENCOUNTER — Ambulatory Visit (HOSPITAL_COMMUNITY)
Admission: RE | Admit: 2019-03-19 | Discharge: 2019-03-19 | Disposition: A | Discharge status: Home / Self Care | Payer: PPO | Source: Ambulatory Visit | Attending: Cardiovascular Disease | Admitting: Cardiovascular Disease

## 2019-03-19 DIAGNOSIS — I739 Peripheral vascular disease, unspecified: Secondary | ICD-10-CM | POA: Diagnosis not present

## 2019-03-22 ENCOUNTER — Encounter: Payer: Self-pay | Admitting: Physician Assistant

## 2019-03-22 NOTE — Progress Notes (Signed)
Cardiology Office Note:    Date:  03/23/2019   ID:  Travis Blair, DOB 1950/02/15, MRN [id number]  PCP:  [name], MD  Cardiologist:  [name], MD    Referring MD: [name], MD   Problem list 1.  Coronary artery disease-status post coronary artery bypass grafting 2.  Chronic diastolic congestive heart failure 3.  Postoperative atrial fibrillation 4.  Deep vein thrombosis 5.  Chronic kidney disease 6.  Hypertension 7.  Hyperlipidemia 8.  Diabetes mellitus 9.  Obstructive sleep apnea  Chief Complaint  Patient presents with  . Coronary Artery Disease  . Congestive Heart Failure        Travis Blair) is a 69 y.o. male with a hx of CAD, DM Seen with wife [name]   Has had DOE with walking  -  Was placed on Imdur, Metoprolol  , passed out , Has CKD - Lisinopril was stopped.  Worse for the past month. Has a + stress test,   Cath shows severe CAD.   Has seen Dr. [name]  - recommnended nephrology consult.  Has not heard back from nephrologist   Has had swelling of left leg for 8 months .  No angina ( is diabetic)   February 26, 2017: Seen back today for follow-up of his coronary artery disease, diabetes mellitus, hypertension, and chronic renal insufficiency. He was found to have a DVT several months ago.  He was started on Eliquis. Has seen Dr. Ron Agee and is scheduled for CABG next Friday  BP is a bit high initially but was normal by the end of the visit   March 26, 2017:  Travis Blair is seen back today for follow-up of his coronary artery disease.  He had coronary artery bypass grafting in January, 2018.  Coronary artery bypass grafting x3 (left internal mammary artery to left anterior descending, saphenous vein graft to posterior descending, saphenous vein graft to circumflex marginal).  He had postoperative atrial fibrillation and was treated with amiodarone.  He was started back  on Eliquis.  Was on Eliquis for a DVT starting   Has had bilateral leg edema   May 27, 2017: Travis Blair is seen back today for follow-up visit.  He has a history of coronary artery bypass grafting approximately 3 months ago.  He had postoperative atrial fibrillation and has been on amiodarone.  He is been on Eliquis for his atrial fibrillation and also for his DVT. Has stopped his rosuvastatin due to leg cramps aching   December 16, 2017: Travis Blair is seen today for follow-up visit.  He has a history of CABG.  He had postoperative atrial fibrillation.  We had him on amiodarone and Eliquis for a while. Stopped his rosuvastatin because of the leg cramps.  We started atorvastatin 40 mg a day.  He seems to be tolerating the Atorvastatin well  Dr. Joylene Draft has restarted the Eliquis for his unprovoked DVT.  Still has DOE and fatigue  Getting IV iron . - he is anemic  Is having some vein work in his left leg in Dec.  Also has OSA .   Needs to get a new CPAP  Wt.   Is 318 today     Feb. 9, 2021  Travis Blair is seen today for a follow-up visit.  He has a history of coronary artery disease and coronary artery bypass grafting.  He has a history of postoperative atrial fibrillation.  He was on short-term amiodarone and Eliquis.  He  was started on rosuvastatin because of hyperlipidemia but but this caused leg cramps.  We changed him to atorvastatin 40 mg a day.  He has a history of a unprovoked DVT and has been on Eliquis. BP is mildly elevated.   BP at home look normal / high Wt is 330 lbs. ( up 12 lbs from last year)  Not getting regular exercise.  Active at work  Developed leg aches.  He stopped the atorvastatin Has CKD .  Sees nephrology  He stopped his eliquis on his own. Advised him to restart Eliquis ( or find a suitable alternative - ie warfarin )   Past Medical History:  Diagnosis Date  . Anemia associated with chronic renal failure basically  . Asthma    as a child  . Chronic kidney disease    Sees Dr [name]  . Coronary artery disease   .  Diabetes mellitus without complication ([place])   . DVT (deep venous thrombosis) ([place])   . Hyperlipidemia   . Hypertension   . Leg pain    ABIs/LE Arterial US 03/2019: ABIs unreliable; +calcification, no evidence of stenosis  . Myocardial infarction ([place])   . Obesity   . Pneumonia 2018  . Sleep apnea     Past Surgical History:  Procedure Laterality Date  . COLONOSCOPY    . CORONARY ARTERY BYPASS GRAFT N/A 03/07/2017   Procedure: CORONARY ARTERY BYPASS GRAFTING (CABG), X 3 , USING RIGHT INTERNAL MAMMARY ARTERY, AND RIGHT LEG GREATER SAPHENOUS VEIN HARVESTED ENDOSCOPICALLY;  Surgeon: [name], MD;  Location: [facility];  Service: Open Heart Surgery;  Laterality: N/A;  . HAND SURGERY    . RIGHT/LEFT HEART CATH AND CORONARY ANGIOGRAPHY N/A 12/20/2016   Procedure: RIGHT/LEFT HEART CATH AND CORONARY ANGIOGRAPHY;  Surgeon: [name], MD;  Location: [facility];  Service: Cardiovascular;  Laterality: N/A;  . TEE WITHOUT CARDIOVERSION N/A 03/07/2017   Procedure: TRANSESOPHAGEAL ECHOCARDIOGRAM (TEE);  Surgeon: [name], [name], MD;  Location: [facility];  Service: Open Heart Surgery;  Laterality: N/A;  . TONSILLECTOMY      Current Medications: Current Meds  Medication Sig  . aspirin EC 81 MG tablet Take 81 mg by mouth daily.  Marland Kitchen azelastine (ASTELIN) 0.1 % nasal spray Place 2 sprays into both nostrils 2 (two) times daily.  . carvedilol (COREG) 6.25 MG tablet Take 1 tablet (6.25 mg total) by mouth 2 (two) times daily with a meal.  . cetirizine (ZYRTEC) 10 MG tablet Take 10 mg by mouth at bedtime.  Marland Kitchen ezetimibe (ZETIA) 10 MG tablet Take 1 tablet (10 mg total) by mouth daily.  . fluticasone (FLONASE) 50 MCG/ACT nasal spray Place 1 spray into both nostrils 2 (two) times daily.  Marland Kitchen glipiZIDE (GLUCOTROL) 5 MG tablet Take 5 mg by mouth daily before breakfast.  . levocetirizine (XYZAL) 5 MG tablet Take 5 mg by mouth at bedtime.  . Multiple Vitamin (MULTIVITAMIN) tablet Take 1 tablet by mouth  daily.  Marland Kitchen neomycin-polymyxin b-dexamethasone (MAXITROL) 3.5-10000-0.1 SUSP   . PROAIR HFA 108 (90 Base) MCG/ACT inhaler      Allergies:   Atorvastatin   Social History   Socioeconomic History  . Marital status: Married    Spouse name: Not on file  . Number of children: Not on file  . Years of education: Not on file  . Highest education level: Not on file  Occupational History  . Not on file  Tobacco Use  . Smoking status: Never Smoker  .  Smokeless tobacco: Never Used  Substance and Sexual Activity  . Alcohol use: No  . Drug use: No  . Sexual activity: Never  Other Topics Concern  . Not on file  Social History Narrative  . Not on file   Social Determinants of Health   Financial Resource Strain:   . Difficulty of Paying Living Expenses: Not on file  Food Insecurity:   . Worried About Charity fundraiser in the Last Year: Not on file  . Ran Out of Food in the Last Year: Not on file  Transportation Needs:   . Lack of Transportation (Medical): Not on file  . Lack of Transportation (Non-Medical): Not on file  Physical Activity:   . Days of Exercise per Week: Not on file  . Minutes of Exercise per Session: Not on file  Stress:   . Feeling of Stress : Not on file  Social Connections:   . Frequency of Communication with Friends and Family: Not on file  . Frequency of Social Gatherings with Friends and Family: Not on file  . Attends Religious Services: Not on file  . Active Member of Clubs or Organizations: Not on file  . Attends Archivist Meetings: Not on file  . Marital Status: Not on file     Family History: The patient's family history includes Cancer in his father, mother, and another family member; Heart disease in his father, mother, and another family member; Hyperlipidemia in an other family member; Hypertension in an other family member; Lymphoma in his sister. ROS:   Please see the history of present illness.     All other systems reviewed and are  negative.  EKGs/Labs/Other Studies Reviewed:    The following studies were reviewed today: Cath films, previous records.     Recent Labs: 04/07/2018: Hemoglobin 12.8 09/21/2018: ALT 17; BUN 36; Creatinine, Ser 2.38; Potassium 5.6; Sodium 134  Recent Lipid Panel    Component Value Date/Time   CHOL 211 (H) 09/21/2018 0728   TRIG 350 (H) 09/21/2018 0728   HDL 34 (L) 09/21/2018 0728   CHOLHDL 6.2 (H) 09/21/2018 0728   LDLCALC 107 (H) 09/21/2018 0728   LDLDIRECT 122 (H) 09/21/2018 7782    Physical Exam: Blood pressure (!) 152/92, pulse 73, height 6\' 2"  (1.88 m), weight (!) 330 lb 9.6 oz (150 kg), SpO2 98 %.  GEN:   Morbidly obese male,  NAD  HEENT: Normal NECK: No JVD; No carotid bruits LYMPHATICS: No lymphadenopathy CARDIAC: RRR RESPIRATORY:  Clear to auscultation without rales, wheezing or rhonchi  ABDOMEN:  Obese,    MUSCULOSKELETAL:  2-3 + bilateral leg edema . No palpable cords  SKIN: Warm and dry NEUROLOGIC:  Alert and oriented x 3   EKG:      ASSESSMENT:    1. Coronary artery disease involving native coronary artery of native heart without angina pectoris   2. S/P CABG (coronary artery bypass graft)   3. PAF (paroxysmal atrial fibrillation) (HCC)   4. Claudication in peripheral vascular disease ([place])   5. Essential hypertension   6. Mixed hyperlipidemia   7. Statin intolerance    PLAN:     1.  Coronary artery disease:    No angina  2.   CKD -  Managed by primary MD and nephrology   3.  Paroxysmal atrial fibrillation:     No recurrence  3.  Hyperlipidemia: He was on atorvastatin but developed leg cramps.  We will send him back to the lipid clinic  for further advice.  I would like to consider him for a PCSK9 inhibitor due to statin intolerance in the setting of coronary artery disease.   4.  DVT:   He stopped his Eliquis on his own.  He has a history of an unprovoked DVT and I suspect this is largely because of his obesity.  I strongly recommended that we  restart his Eliquis.  I encouraged him to check with his insurance company to see if there is a less expensive alternative.  He will call us back if that is the case.  5.   DM -  6. HTN   : Blood pressure is mildly elevated.  I definitely think that his weight is contributing.  Because of his CKD I do not want to start an ARB or spironolactone.  Also do not think that hydrochlorothiazide or chlorthalidone will be effective.  Will try hydralazine 25 BID ( he doubts he will take a TID med)   Medication Adjustments/Labs and Tests Ordered: Current medicines are reviewed at length with the patient today.  Concerns regarding medicines are outlined above.  Orders Placed This Encounter  Procedures  . Lipid Profile  . Basic Metabolic Panel (BMET)  . Hepatic function panel  . AMB Referral to [facility] ordered this encounter  Medications  . hydrALAZINE (APRESOLINE) 25 MG tablet    Sig: Take 1 tablet (25 mg total) by mouth 2 (two) times daily.    Dispense:  60 tablet    Refill:  11  . apixaban (ELIQUIS) 5 MG TABS tablet    Sig: Take 1 tablet (5 mg total) by mouth 2 (two) times daily.    Dispense:  60 tablet    Refill:  11    Signed, [name], MD  03/23/2019 9:25 AM    [facility]

## 2019-03-23 ENCOUNTER — Ambulatory Visit: Payer: PPO | Admitting: Cardiovascular Disease

## 2019-03-23 ENCOUNTER — Other Ambulatory Visit: Payer: Self-pay

## 2019-03-23 ENCOUNTER — Encounter: Payer: Self-pay | Admitting: Cardiovascular Disease

## 2019-03-23 VITALS — BP 152/92 | HR 73 | Ht 74.0 in | Wt 330.6 lb

## 2019-03-23 DIAGNOSIS — I739 Peripheral vascular disease, unspecified: Secondary | ICD-10-CM | POA: Diagnosis not present

## 2019-03-23 DIAGNOSIS — I480 Paroxysmal atrial fibrillation: Secondary | ICD-10-CM

## 2019-03-23 DIAGNOSIS — E782 Mixed hyperlipidemia: Secondary | ICD-10-CM

## 2019-03-23 DIAGNOSIS — Z789 Other specified health status: Secondary | ICD-10-CM | POA: Diagnosis not present

## 2019-03-23 DIAGNOSIS — I2510 Atherosclerotic heart disease of native coronary artery without angina pectoris: Secondary | ICD-10-CM

## 2019-03-23 DIAGNOSIS — I10 Essential (primary) hypertension: Secondary | ICD-10-CM

## 2019-03-23 DIAGNOSIS — Z951 Presence of aortocoronary bypass graft: Secondary | ICD-10-CM

## 2019-03-23 LAB — BASIC METABOLIC PANEL
BUN/Creatinine Ratio: 16 (ref 10–24)
BUN: 38 mg/dL — ABNORMAL HIGH (ref 8–27)
CO2: 21 mmol/L (ref 20–29)
Calcium: 8.4 mg/dL — ABNORMAL LOW (ref 8.6–10.2)
Chloride: 105 mmol/L (ref 96–106)
Creatinine, Ser: 2.4 mg/dL — ABNORMAL HIGH (ref 0.76–1.27)
GFR calc Af Amer: 31 mL/min/{1.73_m2} — ABNORMAL LOW (ref >59)
GFR calc non Af Amer: 27 mL/min/{1.73_m2} — ABNORMAL LOW (ref >59)
Glucose: 214 mg/dL — ABNORMAL HIGH (ref 65–99)
Potassium: 5.7 mmol/L — ABNORMAL HIGH (ref 3.5–5.2)
Sodium: 135 mmol/L (ref 134–144)

## 2019-03-23 LAB — LIPID PANEL
Chol/HDL Ratio: 8.9 ratio — ABNORMAL HIGH (ref 0.0–5.0)
Cholesterol, Total: 322 mg/dL — ABNORMAL HIGH (ref 100–199)
HDL: 36 mg/dL — ABNORMAL LOW (ref >39)
LDL Chol Calc (NIH): 236 mg/dL — ABNORMAL HIGH (ref 0–99)
Triglycerides: 242 mg/dL — ABNORMAL HIGH (ref 0–149)
VLDL Cholesterol Cal: 50 mg/dL — ABNORMAL HIGH (ref 5–40)

## 2019-03-23 LAB — HEPATIC FUNCTION PANEL
ALT: 8 IU/L (ref 0–44)
AST: 12 IU/L (ref 0–40)
Albumin: 3.1 g/dL — ABNORMAL LOW (ref 3.8–4.8)
Alkaline Phosphatase: 70 IU/L (ref 39–117)
Bilirubin Total: <0.2 mg/dL (ref 0.0–1.2)
Bilirubin, Direct: 0.02 mg/dL (ref 0.00–0.40)
Total Protein: 5.3 g/dL — ABNORMAL LOW (ref 6.0–8.5)

## 2019-03-23 MED ORDER — HYDRALAZINE HCL 25 MG PO TABS: 25.0000 mg | ORAL_TABLET | Freq: Two times a day (BID) | ORAL | 11 refills | Status: DC

## 2019-03-23 MED ORDER — APIXABAN 5 MG PO TABS: 5.0000 mg | ORAL_TABLET | Freq: Two times a day (BID) | ORAL | 11 refills | Status: DC

## 2019-03-23 NOTE — Patient Instructions (Addendum)
Medication Instructions:  Your physician has recommended you make the following change in your medication:  RESTART Eliquis 5 mg twice daily START Hydralazine 25 mg twice daily  *If you need a refill on your cardiac medications before your next appointment, please call your pharmacy*  Lab Work: TODAY - cholesterol, liver panel, basic metabolic panel If you have labs (blood work) drawn today and your tests are completely normal, you will receive your results only by: Marland Kitchen MyChart Message (if you have MyChart) OR . A paper copy in the mail If you have any lab test that is abnormal or we need to change your treatment, we will call you to review the results.   Testing/Procedures: None Ordered   Follow-Up: You have been referred to Lipid Clinic for management of your high cholesterol and statin intolerance Come in on Thursday Feb. 18 at 10:00 am  At [facility], you and your health needs are our priority.  As part of our continuing mission to provide you with exceptional heart care, we have created designated Provider Care Teams.  These Care Teams include your primary Cardiologist (physician) and Advanced Practice Providers (APPs -  Physician Assistants and Nurse Practitioners) who all work together to provide you with the care you need, when you need it.  Your next appointment:   6 month(s)  The format for your next appointment:   In Person  Provider:   You may see [name], MD or one of the following Advanced Practice Providers on your designated Care Team:    Richardson Dopp, PA-C  [place], [place]  Daune Perch, [place]

## 2019-04-01 ENCOUNTER — Ambulatory Visit: Payer: PPO

## 2019-04-08 ENCOUNTER — Ambulatory Visit: Payer: PPO | Admitting: Pharmacist

## 2019-04-08 ENCOUNTER — Other Ambulatory Visit: Payer: Self-pay

## 2019-04-08 DIAGNOSIS — E782 Mixed hyperlipidemia: Secondary | ICD-10-CM

## 2019-04-08 DIAGNOSIS — T466X5A Adverse effect of antihyperlipidemic and antiarteriosclerotic drugs, initial encounter: Secondary | ICD-10-CM

## 2019-04-08 DIAGNOSIS — G720 Drug-induced myopathy: Secondary | ICD-10-CM

## 2019-04-08 MED ORDER — ICOSAPENT ETHYL 1 G PO CAPS: 2.0000 g | ORAL_CAPSULE | Freq: Two times a day (BID) | ORAL | 3 refills | Status: DC

## 2019-04-08 MED ORDER — REPATHA SURECLICK 140 MG/ML ~~LOC~~ SOAJ: 1.0000 "pen " | SUBCUTANEOUS | 3 refills | Status: DC

## 2019-04-08 NOTE — Patient Instructions (Signed)
It was nice to meet you today  Your LDL cholesterol is 236 and your goal is less than 70 -I will submit information to your insurance for Repatha injections that you give in the fatty tissue of your stomach every 14 days. They will lower your LDL cholesterol by 60% and also help to prevent heart attacks and strokes  Your triglycerides are 242 and your goal is less than 150 -I will submit information to your insurance for Vascepa (prescription fish oil). It will lower your triglycerides by 30% and also help to prevent heart attacks and strokes  I will apply for patient assistance through the [facility] so that you don't need to pay for either medication  We will recheck your cholesterol 6 weeks after starting these medications  Call Myosha Cuadras, Pharmacist with any concerns [phone]

## 2019-04-08 NOTE — Addendum Note (Signed)
Addended by: Zara Wendt E on: 04/08/2019 01:40 PM   Modules accepted: Orders

## 2019-04-08 NOTE — Progress Notes (Addendum)
Patient ID: Travis Blair [facility]                 DOB: Apr 03, 1950                    MRN: [id number]     HPI: Travis Blair is a 69 y.o. male patient referred to lipid clinic by Dr Acie Fredrickson. PMH is significant for CAD s/p CABG in 2025, diastolic CHF, postop afib, DVT, CKD, HTN, HLD, DM, and OSA. He has a history of statin intolerance and presents for further management.  Pt presents today in good spirits. He is not currently on any lipid lowering therapy. He experienced myalgias and burning in his legs 2-3 weeks after starting both rosuvastatin and atorvastatin. Pain disappeared once he stopped statin therapy in each case. He still has Zetia on his med list but states he does not take this and doesn't remember when/why he stopped. He does take 2400mg  of OTC fish oil daily. Reports he has made some dietary improvements and is starting to follow the keto diet. Would like to start walking more with his wife as the weather improves.  Current Medications: OTC fish oil 2400mg  daily Intolerances: rosuvastatin 20mg  daily, atorvastatin 10mg , 20mg , and 40mg  daily - leg cramps, ezetimibe - does not remember why he stopped Risk Factors: CAD s/p CABG, CHF, CKD, HTN, DM, obesity LDL goal: 70mg /dL  Diet: Has changed to keto diet. Drinks water. Has cut back on saturated fats. Does like carbs. Minimal alcohol.  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  Social History: Denies tobacco, alcohol, and drug use.  Labs: 03/23/19: TC 322, TG 242, HDL 36, LDL 236 - no lipid lowering therapy  Past Medical History:  Diagnosis Date  . Anemia associated with chronic renal failure basically  .  Asthma    as a child  . Chronic kidney disease    Sees Dr [name]  . Coronary artery disease   . Diabetes mellitus without complication ([place])   . DVT (deep venous thrombosis) ([place])   . Hyperlipidemia   . Hypertension   . Leg pain    ABIs/LE Arterial US 03/2019: ABIs unreliable; +calcification, no evidence of stenosis  . Myocardial infarction ([place])   . Obesity   . Pneumonia 2018  . Sleep apnea     Current Outpatient Medications on File Prior to Visit  Medication Sig Dispense Refill  . apixaban (ELIQUIS) 5 MG TABS tablet Take 1 tablet (5 mg total) by mouth 2 (two) times daily. 60 tablet 11  . aspirin EC 81 MG tablet Take 81 mg by mouth daily.    Marland Kitchen azelastine (ASTELIN) 0.1 % nasal spray Place 2 sprays into both nostrils 2 (two) times daily.    . carvedilol (COREG) 6.25 MG tablet Take 1 tablet (6.25 mg total) by mouth 2 (two) times daily with a meal. 180 tablet 1  . cetirizine (ZYRTEC) 10 MG tablet Take 10 mg by mouth at bedtime.    Marland Kitchen ezetimibe (ZETIA) 10 MG tablet Take 1 tablet (10 mg total) by mouth daily. 90 tablet 3  . fluticasone (FLONASE) 50 MCG/ACT nasal spray Place 1 spray into both nostrils 2 (two) times daily.    Marland Kitchen  glipiZIDE (GLUCOTROL) 5 MG tablet Take 5 mg by mouth daily before breakfast.    . hydrALAZINE (APRESOLINE) 25 MG tablet Take 1 tablet (25 mg total) by mouth 2 (two) times daily. 60 tablet 11  . levocetirizine (XYZAL) 5 MG tablet Take 5 mg by mouth at bedtime.    . Multiple Vitamin (MULTIVITAMIN) tablet Take 1 tablet by mouth daily.    Marland Kitchen neomycin-polymyxin b-dexamethasone (MAXITROL) 3.5-10000-0.1 SUSP     . PROAIR HFA 108 (90 Base) MCG/ACT inhaler      No current facility-administered medications on file prior to visit.    Allergies  Allergen Reactions  . Atorvastatin     Leg aches     Assessment/Plan:  1. Hyperlipidemia - Baseline LDL is 236 far above goal < 70, TG also above goal <150 at 242. He is intolerant to 2 statins including lowest starting dose of  atorvastatin. Discussed lipid lowering options including PCSK9i therapy, Nexlizet, or statin rechallenge with pravastatin. He is agreeable to try Repatha first as this will be most efficacious to lower his LDL. Discussed that since his baseline is so high, he may need additional therapy on top of this. Discussed expected benefits, side effects, and injection technique. Prior authorization for Repatha 140mg  Q2W submitted and approved through 06/07/19. Will also start Vascepa 2g BID for TG lowering and CV benefit. Pt will stop OTC fish oil. Both Repatha and Vascepa are Tier 4 meds on his insurance - successfully applied for grant through [employer] to help with affordability. Will check lipids and LFTs 6 weeks after starting both Repatha and Vascepa.  Arine Foley E. Wynne Jury, PharmD, BCACP, [place] [address], [place], [place] [address] Phone: [phone]; Fax: [phone] 04/08/2019 10:26 AM

## 2019-04-12 DIAGNOSIS — H00014 Hordeolum externum left upper eyelid: Secondary | ICD-10-CM | POA: Diagnosis not present

## 2019-04-14 DIAGNOSIS — G4733 Obstructive sleep apnea (adult) (pediatric): Secondary | ICD-10-CM | POA: Diagnosis not present

## 2019-04-22 NOTE — Progress Notes (Signed)
Subjective:  Patient ID: Travis Blair, male    DOB: 1950-05-03,  MRN: [id number]  Chief Complaint  Patient presents with  . Hammer Toe    Right foot surgical consult for hammertoe    69 y.o. male presents with the above complaint.  Would like to schedule his surgery at this time.  Review of Systems: Negative except as noted in the HPI. Denies N/V/F/Ch.  Past Medical History:  Diagnosis Date  . Anemia associated with chronic renal failure basically  . Asthma    as a child  . Chronic kidney disease    Sees Dr [name]  . Coronary artery disease   . Diabetes mellitus without complication ([place])   . DVT (deep venous thrombosis) ([place])   . Hyperlipidemia   . Hypertension   . Leg pain    ABIs/LE Arterial US 03/2019: ABIs unreliable; +calcification, no evidence of stenosis  . Myocardial infarction ([place])   . Obesity   . Pneumonia 2018  . Sleep apnea     Current Outpatient Medications:  .  aspirin EC 81 MG tablet, Take 81 mg by mouth daily., Disp: , Rfl:  .  carvedilol (COREG) 6.25 MG tablet, Take 1 tablet (6.25 mg total) by mouth 2 (two) times daily with a meal., Disp: 180 tablet, Rfl: 1 .  glipiZIDE (GLUCOTROL) 5 MG tablet, Take 5 mg by mouth daily before breakfast., Disp: , Rfl:  .  Multiple Vitamin (MULTIVITAMIN) tablet, Take 1 tablet by mouth daily., Disp: , Rfl:  .  apixaban (ELIQUIS) 5 MG TABS tablet, Take 1 tablet (5 mg total) by mouth 2 (two) times daily., Disp: 60 tablet, Rfl: 11 .  azelastine (ASTELIN) 0.1 % nasal spray, Place 2 sprays into both nostrils 2 (two) times daily., Disp: , Rfl:  .  cetirizine (ZYRTEC) 10 MG tablet, Take 10 mg by mouth at bedtime., Disp: , Rfl:  .  Evolocumab (REPATHA SURECLICK) 244 MG/ML SOAJ, Inject 1 pen into the skin every 14 (fourteen) days., Disp: 6 pen, Rfl: 3 .  fluticasone (FLONASE) 50 MCG/ACT nasal spray, Place 1 spray into both nostrils 2 (two) times daily., Disp: , Rfl:  .  hydrALAZINE (APRESOLINE) 25 MG tablet, Take 1 tablet (25 mg  total) by mouth 2 (two) times daily., Disp: 60 tablet, Rfl: 11 .  icosapent Ethyl (VASCEPA) 1 g capsule, Take 2 capsules (2 g total) by mouth 2 (two) times daily., Disp: 360 capsule, Rfl: 3 .  levocetirizine (XYZAL) 5 MG tablet, Take 5 mg by mouth at bedtime., Disp: , Rfl:  .  neomycin-polymyxin b-dexamethasone (MAXITROL) 3.5-10000-0.1 SUSP, , Disp: , Rfl:  .  PROAIR HFA 108 (90 Base) MCG/ACT inhaler, , Disp: , Rfl:   Social History   Tobacco Use  Smoking Status Never Smoker  Smokeless Tobacco Never Used    Allergies  Allergen Reactions  . Atorvastatin     Leg aches    Objective:  There were no vitals filed for this visit. There is no height or weight on file to calculate BMI. Constitutional Well developed. Well nourished.  Vascular Dorsalis pedis pulses palpable bilaterally. Posterior tibial pulses non-palpable bilaterally. Capillary refill normal to all digits.  No cyanosis or clubbing noted. Pedal hair growth normal.  Neurologic Normal speech. Oriented to person, place, and time. Epicritic sensation to light touch grossly present bilaterally. Protective sensation absent bilat.  Dermatologic Nails elongated and thickened. No skin lesions. Hyperkeratotic lesion right sub-met 5 Skin irritation dorsal second PIPJ  Orthopedic: Normal joint ROM without  pain or crepitus bilaterally. Rigid right second hammertoe deformity No bony tenderness.   Radiographs: None  Assessment:   1. Hammer toe of right foot   2. Deformity of metatarsal bone of right foot    Plan:  Patient was evaluated and treated and all questions answered.   Right foot prominent fifth metatarsal head, capsulitis, right second hammertoe -Plan for surgery at this time -Patient has failed all conservative therapy and wishes to proceed with surgical intervention. All risks, benefits, and alternatives discussed with patient. No guarantees given. Consent reviewed and signed by patient. -Planned procedures:  excision right 5th metatarsal head, correction right 2nd hammertoe.   No follow-ups on file.

## 2019-04-27 DIAGNOSIS — H00014 Hordeolum externum left upper eyelid: Secondary | ICD-10-CM | POA: Diagnosis not present

## 2019-05-03 DIAGNOSIS — E1122 Type 2 diabetes mellitus with diabetic chronic kidney disease: Secondary | ICD-10-CM | POA: Diagnosis not present

## 2019-05-03 DIAGNOSIS — N2581 Secondary hyperparathyroidism of renal origin: Secondary | ICD-10-CM | POA: Diagnosis not present

## 2019-05-03 DIAGNOSIS — N183 Chronic kidney disease, stage 3 unspecified: Secondary | ICD-10-CM | POA: Diagnosis not present

## 2019-05-03 DIAGNOSIS — D631 Anemia in chronic kidney disease: Secondary | ICD-10-CM | POA: Diagnosis not present

## 2019-05-03 DIAGNOSIS — I2510 Atherosclerotic heart disease of native coronary artery without angina pectoris: Secondary | ICD-10-CM | POA: Diagnosis not present

## 2019-05-03 DIAGNOSIS — N189 Chronic kidney disease, unspecified: Secondary | ICD-10-CM | POA: Diagnosis not present

## 2019-05-03 DIAGNOSIS — E785 Hyperlipidemia, unspecified: Secondary | ICD-10-CM | POA: Diagnosis not present

## 2019-05-12 ENCOUNTER — Other Ambulatory Visit: Payer: PPO | Admitting: *Deleted

## 2019-05-12 ENCOUNTER — Other Ambulatory Visit: Payer: Self-pay

## 2019-05-12 DIAGNOSIS — E782 Mixed hyperlipidemia: Secondary | ICD-10-CM

## 2019-05-12 LAB — HEPATIC FUNCTION PANEL
ALT: 11 IU/L (ref 0–44)
AST: 14 IU/L (ref 0–40)
Albumin: 3.2 g/dL — ABNORMAL LOW (ref 3.8–4.8)
Alkaline Phosphatase: 68 IU/L (ref 39–117)
Bilirubin Total: <0.2 mg/dL (ref 0.0–1.2)
Bilirubin, Direct: 0.06 mg/dL (ref 0.00–0.40)
Total Protein: 5.9 g/dL — ABNORMAL LOW (ref 6.0–8.5)

## 2019-05-12 LAB — LIPID PANEL
Chol/HDL Ratio: 4.2 ratio (ref 0.0–5.0)
Cholesterol, Total: 156 mg/dL (ref 100–199)
HDL: 37 mg/dL — ABNORMAL LOW (ref >39)
LDL Chol Calc (NIH): 86 mg/dL (ref 0–99)
Triglycerides: 192 mg/dL — ABNORMAL HIGH (ref 0–149)
VLDL Cholesterol Cal: 33 mg/dL (ref 5–40)

## 2019-05-13 ENCOUNTER — Telehealth: Payer: Self-pay | Admitting: Pharmacist

## 2019-05-13 DIAGNOSIS — E782 Mixed hyperlipidemia: Secondary | ICD-10-CM

## 2019-05-13 NOTE — Telephone Encounter (Signed)
Left message for pt to discuss lipid panel results.  LDL has improved from 236 to 86 since starting Repatha injections. TG have improved from 242 to 192 since starting Vascepa.  Goal LDL < 70. Discussed at last visit that multiple agents would likely be needed to bring LDL to goal given elevated baseline LDL. He is previously intolerant to rosuvastatin 20mg  daily and atorvastatin 10-40mg  daily (myalgias).  Will discuss adding Nexlizet when pt returns call for additional 40% LDL lowering. Plan to continue [place]. He was previously approved for [employer] so Nexlizet would be free once prior authorization is approved.

## 2019-05-15 DIAGNOSIS — G4733 Obstructive sleep apnea (adult) (pediatric): Secondary | ICD-10-CM | POA: Diagnosis not present

## 2019-05-18 NOTE — Telephone Encounter (Signed)
Left 2nd message for pt.

## 2019-05-19 MED ORDER — EZETIMIBE 10 MG PO TABS: 10.0000 mg | ORAL_TABLET | Freq: Every day | ORAL | 11 refills | Status: DC

## 2019-05-19 NOTE — Telephone Encounter (Signed)
Spoke with pt regarding lipid results. He is tolerating Repatha and Vascepa well and has had a 64% reduction in his LDL. LDL now 86 down from 236, goal < 70.  He has already used through $400 of his [place] in the last 1.5 months - it may not last the whole year. Will add on Zetia instead of Nexlizet since 20% LDL reduction will bring LDL to goal < 70, but Zetia will be cheaper than Nexlizet so that his grant will last longer. Pt in agreement with plan and will call with any issues.  Scheduled f/u labs in 3 months.

## 2019-06-10 ENCOUNTER — Other Ambulatory Visit: Payer: Self-pay

## 2019-06-10 ENCOUNTER — Ambulatory Visit: Payer: PPO | Admitting: Podiatry

## 2019-06-10 DIAGNOSIS — B351 Tinea unguium: Secondary | ICD-10-CM

## 2019-06-10 DIAGNOSIS — E1142 Type 2 diabetes mellitus with diabetic polyneuropathy: Secondary | ICD-10-CM | POA: Diagnosis not present

## 2019-06-10 DIAGNOSIS — E1169 Type 2 diabetes mellitus with other specified complication: Secondary | ICD-10-CM | POA: Diagnosis not present

## 2019-06-14 DIAGNOSIS — G4733 Obstructive sleep apnea (adult) (pediatric): Secondary | ICD-10-CM | POA: Diagnosis not present

## 2019-07-09 ENCOUNTER — Other Ambulatory Visit: Payer: Self-pay | Admitting: Physician Assistant

## 2019-07-15 DIAGNOSIS — G4733 Obstructive sleep apnea (adult) (pediatric): Secondary | ICD-10-CM | POA: Diagnosis not present

## 2019-08-11 NOTE — Progress Notes (Signed)
  Subjective:  Patient ID: Travis Blair, male    DOB: 04-Apr-1950,  MRN: [id number]  Chief Complaint  Patient presents with  . Nail Problem    Nail trim 1-5 bilateral    69 y.o. male presents with the above complaint. History confirmed with patient. Hx as above.  Objective:  Physical Exam: warm, good capillary refill, nail exam onychomycosis of the toenails, no trophic changes or ulcerative lesions, normal DP and PT pulses, and absent protective sensation. Left Foot: normal exam, no swelling, tenderness, instability; ligaments intact, full range of motion of all ankle/foot joints  Right Foot: normal exam, no swelling, tenderness, instability; ligaments intact, full range of motion of all ankle/foot joints   No images are attached to the encounter.  Assessment:   1. Onychomycosis of multiple toenails with type 2 diabetes mellitus and peripheral neuropathy ([place])      Plan:  Patient was evaluated and treated and all questions answered.  Onychomycosis, Diabetes and DPN -Patient is diabetic with a qualifying condition for at risk foot care.  Procedure: Nail Debridement Rationale: Patient meets criteria for routine foot care due to DPN Type of Debridement: manual, sharp debridement. Instrumentation: Nail nipper, rotary burr. Number of Nails: 10     No follow-ups on file.

## 2019-08-12 ENCOUNTER — Other Ambulatory Visit: Payer: Self-pay

## 2019-08-12 ENCOUNTER — Ambulatory Visit: Payer: PPO | Admitting: Podiatry

## 2019-08-12 VITALS — Temp 97.1°F

## 2019-08-12 DIAGNOSIS — E1142 Type 2 diabetes mellitus with diabetic polyneuropathy: Secondary | ICD-10-CM

## 2019-08-12 DIAGNOSIS — L84 Corns and callosities: Secondary | ICD-10-CM | POA: Diagnosis not present

## 2019-08-12 DIAGNOSIS — E1169 Type 2 diabetes mellitus with other specified complication: Secondary | ICD-10-CM | POA: Diagnosis not present

## 2019-08-12 DIAGNOSIS — B351 Tinea unguium: Secondary | ICD-10-CM

## 2019-08-12 NOTE — Progress Notes (Signed)
  Subjective:  Patient ID: Travis Blair, male    DOB: 06/17/1950,  MRN: [id number]  Chief Complaint  Patient presents with  . Nail Problem    Onychomycosis  . Diabetes    No recent A1c on file. Most recent A1c on file = 7.2 in 2019.    69 y.o. male presents with the above complaint. History confirmed with patient. Hx as above.  Objective:  Physical Exam: warm, good capillary refill, nail exam onychomycosis of the toenails, no trophic changes or ulcerative lesions, normal DP and PT pulses, and absent protective sensation. Left Foot: normal exam, no swelling, tenderness, instability; ligaments intact, full range of motion of all ankle/foot joints  Right Foot: normal exam, no swelling, tenderness, instability; ligaments intact, full range of motion of all ankle/foot joints   No images are attached to the encounter.  Assessment:   1. Onychomycosis of multiple toenails with type 2 diabetes mellitus and peripheral neuropathy ([place])   2. Callus    Plan:  Patient was evaluated and treated and all questions answered.  Onychomycosis, Diabetes and DPN -Patient is diabetic with a qualifying condition for at risk foot care.  Procedure: Nail Debridement Rationale: Patient meets criteria for routine foot care due to DPN Type of Debridement: manual, sharp debridement. Instrumentation: Nail nipper, rotary burr. Number of Nails: 10  Procedure: Paring of Lesion Rationale: painful hyperkeratotic lesion Type of Debridement: manual, sharp debridement. Instrumentation: 312 blade Number of Lesions: 10    No follow-ups on file.

## 2019-08-14 DIAGNOSIS — G4733 Obstructive sleep apnea (adult) (pediatric): Secondary | ICD-10-CM | POA: Diagnosis not present

## 2019-08-20 DIAGNOSIS — C44622 Squamous cell carcinoma of skin of right upper limb, including shoulder: Secondary | ICD-10-CM | POA: Diagnosis not present

## 2019-08-20 DIAGNOSIS — L814 Other melanin hyperpigmentation: Secondary | ICD-10-CM | POA: Diagnosis not present

## 2019-08-20 DIAGNOSIS — Z85828 Personal history of other malignant neoplasm of skin: Secondary | ICD-10-CM | POA: Diagnosis not present

## 2019-08-20 DIAGNOSIS — L570 Actinic keratosis: Secondary | ICD-10-CM | POA: Diagnosis not present

## 2019-08-20 DIAGNOSIS — L821 Other seborrheic keratosis: Secondary | ICD-10-CM | POA: Diagnosis not present

## 2019-08-25 ENCOUNTER — Other Ambulatory Visit: Payer: Self-pay

## 2019-08-25 ENCOUNTER — Other Ambulatory Visit: Payer: PPO | Admitting: *Deleted

## 2019-08-25 DIAGNOSIS — E782 Mixed hyperlipidemia: Secondary | ICD-10-CM

## 2019-08-25 LAB — HEPATIC FUNCTION PANEL
ALT: 9 IU/L (ref 0–44)
AST: 13 IU/L (ref 0–40)
Albumin: 3.5 g/dL — ABNORMAL LOW (ref 3.8–4.8)
Alkaline Phosphatase: 63 IU/L (ref 48–121)
Bilirubin Total: 0.2 mg/dL (ref 0.0–1.2)
Bilirubin, Direct: 0.1 mg/dL (ref 0.00–0.40)
Total Protein: 6 g/dL (ref 6.0–8.5)

## 2019-08-25 LAB — LIPID PANEL
Chol/HDL Ratio: 3.7 ratio (ref 0.0–5.0)
Cholesterol, Total: 133 mg/dL (ref 100–199)
HDL: 36 mg/dL — ABNORMAL LOW (ref >39)
LDL Chol Calc (NIH): 73 mg/dL (ref 0–99)
Triglycerides: 133 mg/dL (ref 0–149)
VLDL Cholesterol Cal: 24 mg/dL (ref 5–40)

## 2019-08-26 ENCOUNTER — Telehealth: Payer: Self-pay | Admitting: Cardiovascular Disease

## 2019-08-26 NOTE — Telephone Encounter (Signed)
Returned call to pt and he has been made aware of his lab results See result note.

## 2019-08-26 NOTE — Telephone Encounter (Signed)
Patient is requesting to discuss results from lab work completed on 08/25/19. Please call.

## 2019-08-26 NOTE — Telephone Encounter (Signed)
-----   Message from Nuala Alpha, LPN sent at 1/42/3200  5:13 PM EDT -----  ----- Message ----- From: Thayer Headings, MD Sent: 08/25/2019   5:09 PM EDT To: Rebec[facility] Triage  Labs are stable

## 2019-09-02 ENCOUNTER — Ambulatory Visit (INDEPENDENT_AMBULATORY_CARE_PROVIDER_SITE_OTHER): Payer: PPO | Admitting: Family Medicine

## 2019-09-02 ENCOUNTER — Encounter (INDEPENDENT_AMBULATORY_CARE_PROVIDER_SITE_OTHER): Payer: Self-pay | Admitting: Family Medicine

## 2019-09-02 ENCOUNTER — Other Ambulatory Visit: Payer: Self-pay

## 2019-09-02 VITALS — BP 181/93 | HR 61 | Temp 97.9°F | Ht 73.0 in | Wt 315.0 lb

## 2019-09-02 DIAGNOSIS — Z0289 Encounter for other administrative examinations: Secondary | ICD-10-CM

## 2019-09-02 DIAGNOSIS — E1169 Type 2 diabetes mellitus with other specified complication: Secondary | ICD-10-CM

## 2019-09-02 DIAGNOSIS — E1159 Type 2 diabetes mellitus with other circulatory complications: Secondary | ICD-10-CM | POA: Diagnosis not present

## 2019-09-02 DIAGNOSIS — R0602 Shortness of breath: Secondary | ICD-10-CM | POA: Diagnosis not present

## 2019-09-02 DIAGNOSIS — R5383 Other fatigue: Secondary | ICD-10-CM | POA: Diagnosis not present

## 2019-09-02 DIAGNOSIS — Z6841 Body Mass Index (BMI) 40.0 and over, adult: Secondary | ICD-10-CM

## 2019-09-02 DIAGNOSIS — Z9989 Dependence on other enabling machines and devices: Secondary | ICD-10-CM

## 2019-09-02 DIAGNOSIS — I10 Essential (primary) hypertension: Secondary | ICD-10-CM | POA: Diagnosis not present

## 2019-09-02 DIAGNOSIS — G4733 Obstructive sleep apnea (adult) (pediatric): Secondary | ICD-10-CM | POA: Diagnosis not present

## 2019-09-02 DIAGNOSIS — E785 Hyperlipidemia, unspecified: Secondary | ICD-10-CM

## 2019-09-02 DIAGNOSIS — Z1331 Encounter for screening for depression: Secondary | ICD-10-CM | POA: Diagnosis not present

## 2019-09-02 NOTE — Progress Notes (Signed)
Chief Complaint:   OBESITY Travis Blair (MR# [id number]) is a 69 y.o. male who presents for evaluation and treatment of obesity and related comorbidities. Current BMI is Body mass index is 41.56 kg/m. Travis Blair has been struggling with his weight for many years and has been unsuccessful in either losing weight, maintaining weight loss, or reaching his healthy weight goal.  Travis Blair is currently in the action stage of change and ready to dedicate time achieving and maintaining a healthier weight. Travis Blair is interested in becoming our patient and working on intensive lifestyle modifications including (but not limited to) diet and exercise for weight loss.  Travis Blair says he has tried low carb, low sodium in the past.  He is married and lives with his wife, Travis Blair.  He is a Clinical biochemist.  He skips meals/fasts or overeats.  He says those are his worst habits.  He eats outside the house 21 times per week!  He sees Dr. Jimmye Blair at [facility] for stage 4 CKD.  Sees Dr. Cathie Blair in Caridology.  His PCP is Dr. Allyn Blair, who treats his diabetes.  He says he eats 3 meals per day.  Travis Blair's habits were reviewed today and are as follows: His family eats meals together, he thinks his family will eat healthier with him, his desired weight loss is 65 pounds, he started gaining weight after age 30, his heaviest weight ever was 400 pounds, he frequently makes poor food choices, he frequently eats larger portions than normal and he struggles with emotional eating.  Depression Screen Travis Blair's Food and Mood (modified PHQ-9) score was 9.  Depression screen [facility] 2/9 09/02/2019  Decreased Interest 1  Down, Depressed, Hopeless 0  PHQ - 2 Score 1  Altered sleeping 1  Tired, decreased energy 2  Change in appetite 2  Feeling bad or failure about yourself  1  Trouble concentrating 1  Moving slowly or fidgety/restless 1  Suicidal thoughts 0  PHQ-9 Score 9  Difficult doing work/chores Not difficult at  all   Subjective:   1. Other fatigue Travis Blair admits to daytime somnolence and denies waking up still tired. Patent has a history of symptoms of daytime fatigue and snoring. Travis Blair generally gets 8 hours of sleep per night, and states that he has generally restful sleep. Snoring is present. Apneic episodes are present. Epworth Sleepiness Score is 7.  2. SOB (shortness of breath) on exertion Travis Blair notes increasing shortness of breath with exercising and seems to be worsening over time with weight gain. He notes getting out of breath sooner with activity than he used to. This has gotten worse recently. Travis Blair denies shortness of breath at rest or orthopnea.  3. Hypertension associated with diabetes ([place]) Review: taking medications as instructed, no medication side effects noted, no chest pain on exertion, no dyspnea on exertion, no swelling of ankles.  Blood pressure is poorly controlled at 181/93 on intake today.  He says that it is normally 140/80 at home.  He admits to having white coat syndrome.  BP Readings from Last 3 Encounters:  09/02/19 (!) 181/93  03/23/19 (!) 152/92  03/05/19 (!) 172/82   4. Type 2 diabetes mellitus with other specified complication, without long-term current use of insulin (HCC) Medications reviewed. Diabetic ROS: no polyuria or polydipsia, no chest pain, dyspnea or TIA's, no numbness, tingling or pain in extremities.  Fasting blood sugars range from 105-119 and have been stable lately.  Lab Results  Component Value Date   HGBA1C 7.0 (  H) 09/02/2019   HGBA1C 7.2 (H) 03/04/2017   Lab Results  Component Value Date   LDLCALC 73 08/25/2019   CREATININE 2.82 (H) 09/02/2019   5. Hyperlipidemia associated with type 2 diabetes mellitus ([place]) [name] has hyperlipidemia and has been trying to improve his cholesterol levels with intensive lifestyle modification including a low saturated fat diet, exercise and weight loss. He denies any chest pain, claudication or  myalgias. He is taking Repatha and Vascepa.  Lab Results  Component Value Date   ALT 9 08/25/2019   AST 13 08/25/2019   ALKPHOS 63 08/25/2019   BILITOT 0.2 08/25/2019   Lab Results  Component Value Date   CHOL 133 08/25/2019   HDL 36 (L) 08/25/2019   LDLCALC 73 08/25/2019   LDLDIRECT 122 (H) 09/21/2018   TRIG 133 08/25/2019   CHOLHDL 3.7 08/25/2019   6. OSA on CPAP Travis Blair has a diagnosis of sleep apnea. He reports that he is using a CPAP regularly.  Management per Dr. Nevada Blair.  7. Depression screening Travis Blair was screened for depression as part of his new patient workup.   Assessment/Plan:   1. Other fatigue Travis Blair does feel that his weight is causing his energy to be lower than it should be. Fatigue may be related to obesity, depression or many other causes. Labs will be ordered, and in the meanwhile, Travis Blair will focus on self care including making healthy food choices, increasing physical activity and focusing on stress reduction. - EKG 12-Lead - VITAMIN D 25 Hydroxy (Vit-D Deficiency, Fractures) - Vitamin B12 - Folate - T3 - T4, free - TSH  2. SOB (shortness of breath) on exertion Travis Blair does feel that he gets out of breath more easily that he used to when he exercises. Travis Blair's shortness of breath appears to be obesity related and exercise induced. He has agreed to work on weight loss and gradually increase exercise to treat his exercise induced shortness of breath. Will continue to monitor closely. - VITAMIN D 25 Hydroxy (Vit-D Deficiency, Fractures) - Vitamin B12 - Folate - T3 - T4, free - TSH  3. Hypertension associated with diabetes ([place]) Worsening.  Travis Blair is working on healthy weight loss and exercise to improve blood pressure control. We will watch for signs of hypotension as he continues his lifestyle modifications.  Travis Blair will continue to monitor at home and follow-up with Cardiology/PCP as scheduled or sooner if not at goal.  Continue prudent  nutritional plan, weight loss.  4. Type 2 diabetes mellitus with other specified complication, without long-term current use of insulin (HCC) Good blood sugar control is important to decrease the likelihood of diabetic complications such as nephropathy, neuropathy, limb loss, blindness, coronary artery disease, and death. Intensive lifestyle modification including diet, exercise and weight loss are the first line of treatment for diabetes.  - Basic metabolic panel - CBC with Differential/Platelet - Hemoglobin A1c - Insulin, random  5. Hyperlipidemia associated with type 2 diabetes mellitus ([place]) Cardiovascular risk and specific lipid/LDL goals reviewed.  We discussed several lifestyle modifications today and Travis Blair will continue to work on diet, exercise and weight loss efforts. Orders and follow up as documented in patient record.   Counseling Intensive lifestyle modifications are the first line treatment for this issue. . Dietary changes: Increase soluble fiber. Decrease simple carbohydrates. . Exercise changes: Moderate to vigorous-intensity aerobic activity 150 minutes per week if tolerated. . Lipid-lowering medications: see documented in medical record.  6. OSA on CPAP Intensive lifestyle modifications are the first line treatment  for this issue. We discussed several lifestyle modifications today and he will continue to work on diet, exercise and weight loss efforts. We will continue to monitor. Orders and follow up as documented in patient record.  Continue treatment as is.  Counseling  Sleep apnea is a condition in which breathing pauses or becomes shallow during sleep. This happens over and over during the night. This disrupts your sleep and keeps your body from getting the rest that it needs, which can cause tiredness and lack of energy (fatigue) during the day.  Sleep apnea treatment: If you were given a device to open your airway while you sleep, USE IT!  Sleep hygiene:    Limit or avoid alcohol, caffeinated beverages, and cigarettes, especially close to bedtime.   Do not eat a large meal or eat spicy foods right before bedtime. This can lead to digestive discomfort that can make it hard for you to sleep.  Keep a sleep diary to help you and your health care provider figure out what could be causing your insomnia.  . Make your bedroom a dark, comfortable place where it is easy to fall asleep. ? Put up shades or blackout curtains to block light from outside. ? Use a white noise machine to block noise. ? Keep the temperature cool. . Limit screen use before bedtime. This includes: ? Watching TV. ? Using your smartphone, tablet, or computer. . Stick to a routine that includes going to bed and waking up at the same times every day and night. This can help you fall asleep faster. Consider making a quiet activity, such as reading, part of your nighttime routine. . Try to avoid taking naps during the day so that you sleep better at night. . Get out of bed if you are still awake after 15 minutes of trying to sleep. Keep the lights down, but try reading or doing a quiet activity. When you feel sleepy, go back to bed.  7. Depression screening Travis Blair had a positive depression screening. Depression is commonly associated with obesity and often results in emotional eating behaviors. We will monitor this closely and work on CBT to help improve the non-hunger eating patterns. Referral to Psychology may be required if no improvement is seen as he continues in our clinic.  8. Class 3 severe obesity with serious comorbidity and body mass index (BMI) of 40.0 to 44.9 in adult, unspecified obesity type ([place]) Travis Blair is currently in the action stage of change and his goal is to continue with weight loss efforts. I recommend Travis Blair begin the structured treatment plan as follows:  He has agreed to the Category 2 Plan.  Exercise goals: As is.   Behavioral modification strategies:  decreasing simple carbohydrates, decreasing eating out, no skipping meals, keeping healthy foods in the home and planning for success.  He was informed of the importance of frequent follow-up visits to maximize his success with intensive lifestyle modifications for his multiple health conditions. He was informed we would discuss his lab results at his next visit unless there is a critical issue that needs to be addressed sooner. Travis Blair agreed to keep his next visit at the agreed upon time to discuss these results.  Objective:   Blood pressure (!) 181/93, pulse 61, temperature 97.9 F (36.6 C), height 6\' 1"  (1.854 m), weight (!) 315 lb (142.9 kg), SpO2 98 %. Body mass index is 41.56 kg/m.  EKG: Normal sinus rhythm, rate 68 bpm.  Indirect Calorimeter completed today shows a VO2 of  222 and a REE of 1545.  His calculated basal metabolic rate is 6568 thus his basal metabolic rate is worse than expected.  General: Cooperative, alert, well developed, in no acute distress. HEENT: Conjunctivae and lids unremarkable. Cardiovascular: Regular rhythm.  Lungs: Normal work of breathing. Neurologic: No focal deficits.   Lab Results  Component Value Date   CREATININE 2.82 (H) 09/02/2019   BUN 51 (H) 09/02/2019   NA 138 09/02/2019   K 5.2 09/02/2019   CL 104 09/02/2019   CO2 22 09/02/2019   Lab Results  Component Value Date   ALT 9 08/25/2019   AST 13 08/25/2019   ALKPHOS 63 08/25/2019   BILITOT 0.2 08/25/2019   Lab Results  Component Value Date   HGBA1C 7.0 (H) 09/02/2019   HGBA1C 7.2 (H) 03/04/2017   Lab Results  Component Value Date   TSH 3.140 09/02/2019   Lab Results  Component Value Date   CHOL 133 08/25/2019   HDL 36 (L) 08/25/2019   LDLCALC 73 08/25/2019   LDLDIRECT 122 (H) 09/21/2018   TRIG 133 08/25/2019   CHOLHDL 3.7 08/25/2019   Lab Results  Component Value Date   WBC 6.6 09/02/2019   HGB 11.6 (L) 09/02/2019   HCT 37.8 09/02/2019   MCV 86 09/02/2019   PLT 231  09/02/2019   Lab Results  Component Value Date   IRON 55 03/24/2018   TIBC 321 03/24/2018   FERRITIN 84 03/24/2018   Obesity Behavioral Intervention Visit Documentation for Insurance:   Approximately 15 minutes were spent on the discussion below.  ASK: We discussed the diagnosis of obesity with Travis Blair today and Travis Blair agreed to give [place] permission to discuss obesity behavioral modification therapy today.  ASSESS: Travis Blair has the diagnosis of obesity and his BMI today is 41.6. Travis Blair is in the action stage of change.   ADVISE: Travis Blair was educated on the multiple health risks of obesity as well as the benefit of weight loss to improve his health. He was advised of the need for long term treatment and the importance of lifestyle modifications to improve his current health and to decrease his risk of future health problems.  AGREE: Multiple dietary modification options and treatment options were discussed and Travis Blair agreed to follow the recommendations documented in the above note.  ARRANGE: Travis Blair was educated on the importance of frequent visits to treat obesity as outlined per CMS and USPSTF guidelines and agreed to schedule his next follow up appointment today.  Attestation Statements:   Reviewed by clinician on day of visit: allergies, medications, problem list, medical history, surgical history, family history, social history, and previous encounter notes.  I, Water quality scientist, CMA, am acting as Location manager for [employer], DO.  I have reviewed the above documentation for accuracy and completeness, and I agree with the above. Mellody Dance, DO

## 2019-09-03 LAB — CBC WITH DIFFERENTIAL/PLATELET
Basophils Absolute: 0 10*3/uL (ref 0.0–0.2)
Basos: 1 %
EOS (ABSOLUTE): 0.2 10*3/uL (ref 0.0–0.4)
Eos: 3 %
Hematocrit: 37.8 % (ref 37.5–51.0)
Hemoglobin: 11.6 g/dL — ABNORMAL LOW (ref 13.0–17.7)
Immature Grans (Abs): 0 10*3/uL (ref 0.0–0.1)
Immature Granulocytes: 0 %
Lymphocytes Absolute: 1.4 10*3/uL (ref 0.7–3.1)
Lymphs: 20 %
MCH: 26.4 pg — ABNORMAL LOW (ref 26.6–33.0)
MCHC: 30.7 g/dL — ABNORMAL LOW (ref 31.5–35.7)
MCV: 86 fL (ref 79–97)
Monocytes Absolute: 0.7 10*3/uL (ref 0.1–0.9)
Monocytes: 11 %
Neutrophils Absolute: 4.3 10*3/uL (ref 1.4–7.0)
Neutrophils: 65 %
Platelets: 231 10*3/uL (ref 150–450)
RBC: 4.4 x10E6/uL (ref 4.14–5.80)
RDW: 14.6 % (ref 11.6–15.4)
WBC: 6.6 10*3/uL (ref 3.4–10.8)

## 2019-09-03 LAB — BASIC METABOLIC PANEL
BUN/Creatinine Ratio: 18 (ref 10–24)
BUN: 51 mg/dL — ABNORMAL HIGH (ref 8–27)
CO2: 22 mmol/L (ref 20–29)
Calcium: 8.5 mg/dL — ABNORMAL LOW (ref 8.6–10.2)
Chloride: 104 mmol/L (ref 96–106)
Creatinine, Ser: 2.82 mg/dL — ABNORMAL HIGH (ref 0.76–1.27)
GFR calc Af Amer: 25 mL/min/{1.73_m2} — ABNORMAL LOW (ref >59)
GFR calc non Af Amer: 22 mL/min/{1.73_m2} — ABNORMAL LOW (ref >59)
Glucose: 120 mg/dL — ABNORMAL HIGH (ref 65–99)
Potassium: 5.2 mmol/L (ref 3.5–5.2)
Sodium: 138 mmol/L (ref 134–144)

## 2019-09-03 LAB — VITAMIN D 25 HYDROXY (VIT D DEFICIENCY, FRACTURES): Vit D, 25-Hydroxy: 12.7 ng/mL — ABNORMAL LOW (ref 30.0–100.0)

## 2019-09-03 LAB — HEMOGLOBIN A1C
Est. average glucose Bld gHb Est-mCnc: 154 mg/dL
Hgb A1c MFr Bld: 7 % — ABNORMAL HIGH (ref 4.8–5.6)

## 2019-09-03 LAB — VITAMIN B12: Vitamin B-12: 377 pg/mL (ref 232–1245)

## 2019-09-03 LAB — TSH: TSH: 3.14 u[IU]/mL (ref 0.450–4.500)

## 2019-09-03 LAB — FOLATE: Folate: 6.7 ng/mL (ref >3.0)

## 2019-09-03 LAB — T3: T3, Total: 107 ng/dL (ref 71–180)

## 2019-09-03 LAB — T4, FREE: Free T4: 1.16 ng/dL (ref 0.82–1.77)

## 2019-09-03 LAB — INSULIN, RANDOM: INSULIN: 9.9 u[IU]/mL (ref 2.6–24.9)

## 2019-09-13 DIAGNOSIS — H5203 Hypermetropia, bilateral: Secondary | ICD-10-CM | POA: Diagnosis not present

## 2019-09-13 DIAGNOSIS — E113393 Type 2 diabetes mellitus with moderate nonproliferative diabetic retinopathy without macular edema, bilateral: Secondary | ICD-10-CM | POA: Diagnosis not present

## 2019-09-14 DIAGNOSIS — G4733 Obstructive sleep apnea (adult) (pediatric): Secondary | ICD-10-CM | POA: Diagnosis not present

## 2019-09-16 ENCOUNTER — Ambulatory Visit (INDEPENDENT_AMBULATORY_CARE_PROVIDER_SITE_OTHER): Payer: PPO | Admitting: Family Medicine

## 2019-09-27 DIAGNOSIS — U071 COVID-19: Secondary | ICD-10-CM | POA: Diagnosis not present

## 2019-09-28 ENCOUNTER — Other Ambulatory Visit: Payer: Self-pay | Admitting: Nurse Practitioner

## 2019-09-28 ENCOUNTER — Ambulatory Visit (HOSPITAL_COMMUNITY)
Admission: RE | Admit: 2019-09-28 | Discharge: 2019-09-28 | Disposition: A | Discharge status: Home / Self Care | Payer: Medicare Other | Source: Ambulatory Visit | Attending: Pulmonary Disease | Admitting: Pulmonary Disease

## 2019-09-28 ENCOUNTER — Telehealth: Payer: Self-pay | Admitting: Nurse Practitioner

## 2019-09-28 DIAGNOSIS — Z23 Encounter for immunization: Secondary | ICD-10-CM | POA: Diagnosis not present

## 2019-09-28 DIAGNOSIS — I2510 Atherosclerotic heart disease of native coronary artery without angina pectoris: Secondary | ICD-10-CM

## 2019-09-28 DIAGNOSIS — Z951 Presence of aortocoronary bypass graft: Secondary | ICD-10-CM | POA: Diagnosis not present

## 2019-09-28 DIAGNOSIS — E1169 Type 2 diabetes mellitus with other specified complication: Secondary | ICD-10-CM

## 2019-09-28 DIAGNOSIS — E785 Hyperlipidemia, unspecified: Secondary | ICD-10-CM | POA: Insufficient documentation

## 2019-09-28 DIAGNOSIS — U071 COVID-19: Secondary | ICD-10-CM

## 2019-09-28 MED ORDER — ALBUTEROL SULFATE HFA 108 (90 BASE) MCG/ACT IN AERS: 2.0000 | INHALATION_SPRAY | Freq: Once | RESPIRATORY_TRACT | Status: DC | PRN

## 2019-09-28 MED ORDER — METHYLPREDNISOLONE SODIUM SUCC 125 MG IJ SOLR: 125.0000 mg | Freq: Once | INTRAMUSCULAR | Status: DC | PRN

## 2019-09-28 MED ORDER — FAMOTIDINE IN NACL 20-0.9 MG/50ML-% IV SOLN: 20.0000 mg | Freq: Once | INTRAVENOUS | Status: DC | PRN

## 2019-09-28 MED ORDER — SODIUM CHLORIDE 0.9 % IV SOLN: INTRAVENOUS | Status: DC | PRN

## 2019-09-28 MED ORDER — DIPHENHYDRAMINE HCL 50 MG/ML IJ SOLN: 50.0000 mg | Freq: Once | INTRAMUSCULAR | Status: DC | PRN

## 2019-09-28 MED ORDER — EPINEPHRINE 0.3 MG/0.3ML IJ SOAJ: 0.3000 mg | Freq: Once | INTRAMUSCULAR | Status: DC | PRN

## 2019-09-28 MED ORDER — SODIUM CHLORIDE 0.9 % IV SOLN
1200.0000 mg | Freq: Once | INTRAVENOUS | Status: AC
  Administered 2019-09-28: 1200 mg via INTRAVENOUS
  Filled 2019-09-28: qty 10

## 2019-09-28 NOTE — Telephone Encounter (Signed)
Called to discuss with Nell Range Brossman about Covid symptoms and the use of casirivimab/imdevimab, a combination monoclonal antibody infusion for those with mild to moderate Covid symptoms and at a high risk of hospitalization.     Pt is qualified for this infusion at the [facility] infusion center due to co-morbid conditions (BMI >25, CAD, CKD, age). Patient referral received from Dr. Nevada Crane who confirms patient was positive on 09/27/19. Patient reports symptom onset around 8/11 with nausea, vomiting, diarrhea, fatigue, cough, and shortness of breath.   He verbalized understanding of appointment and infusion. Scheduled for 09/28/19 @ 1600.    Patient Active Problem List   Diagnosis Date Noted  . Hammer toe of right foot 12/25/2018  . Deformity of metatarsal bone of right foot 12/25/2018  . Coronary artery disease involving native coronary artery of native heart without angina pectoris 07/27/2018  . Essential hypertension 07/27/2018  . S/P CABG x 3 03/07/2017  . Abnormal stress test 12/18/2016  . Right atrial dilatation 11/17/2016  . Right ventricular dilation 11/17/2016  . Near syncope 11/16/2016  . Uncontrolled type 2 diabetes mellitus with hyperglycemia, without long-term current use of insulin ([place]) 11/16/2016  . Type 2 diabetes mellitus with hyperlipidemia ([place]) 11/16/2016  . Hyperlipidemia, unspecified 11/16/2016  . Obesity 11/16/2016  . Hyponatremia 11/16/2016  . AKI (acute kidney injury) ([place]) 11/16/2016    Alda Lea, AGPCNP-BC

## 2019-09-28 NOTE — Discharge Instructions (Signed)

## 2019-09-28 NOTE — Progress Notes (Signed)
I connected by phone with Travis Blair on 09/28/2019 at 9:13 AM to discuss the potential use of a new treatment for mild to moderate COVID-19 viral infection in non-hospitalized patients.  This patient is a 69 y.o. male that meets the FDA criteria for Emergency Use Authorization of COVID monoclonal antibody casirivimab/imdevimab.  Has a (+) direct SARS-CoV-2 viral test result  Has mild or moderate COVID-19   Is NOT hospitalized due to COVID-19  Is within 10 days of symptom onset  Has at least one of the high risk factor(s) for progression to severe COVID-19 and/or hospitalization as defined in EUA.  Specific high risk criteria : Chronic Kidney Disease (CKD), CAD, BMI, age    I have spoken and communicated the following to the patient or parent/caregiver regarding COVID monoclonal antibody treatment:  1. FDA has authorized the emergency use for the treatment of mild to moderate COVID-19 in adults and pediatric patients with positive results of direct SARS-CoV-2 viral testing who are 45 years of age and older weighing at least 40 kg, and who are at high risk for progressing to severe COVID-19 and/or hospitalization.  2. The significant known and potential risks and benefits of COVID monoclonal antibody, and the extent to which such potential risks and benefits are unknown.  3. Information on available alternative treatments and the risks and benefits of those alternatives, including clinical trials.  4. Patients treated with COVID monoclonal antibody should continue to self-isolate and use infection control measures (e.g., wear mask, isolate, social distance, avoid sharing personal items, clean and disinfect "high touch" surfaces, and frequent handwashing) according to CDC guidelines.   5. The patient or parent/caregiver has the option to accept or refuse COVID monoclonal antibody treatment.  After reviewing this information with the patient, The patient agreed to proceed with  receiving casirivimab\imdevimab infusion and will be provided a copy of the Fact sheet prior to receiving the infusion. [name] 09/28/2019 9:13 AM

## 2019-09-28 NOTE — Progress Notes (Signed)
  Diagnosis: COVID-19  Physician:Dr. Asencion Noble  Procedure: Covid Infusion Clinic Med: casirivimab\imdevimab infusion - Provided patient with casirivimab\imdevimab fact sheet for patients, parents and caregivers prior to infusion.  Complications: No immediate complications noted.  Discharge: Discharged home   Travis Blair 09/28/2019

## 2019-10-01 DIAGNOSIS — U071 COVID-19: Secondary | ICD-10-CM | POA: Diagnosis not present

## 2019-10-06 DIAGNOSIS — U071 COVID-19: Secondary | ICD-10-CM | POA: Diagnosis not present

## 2019-10-09 DIAGNOSIS — G4733 Obstructive sleep apnea (adult) (pediatric): Secondary | ICD-10-CM | POA: Diagnosis not present

## 2019-10-15 ENCOUNTER — Other Ambulatory Visit: Payer: Self-pay

## 2019-10-15 ENCOUNTER — Ambulatory Visit: Payer: PPO | Admitting: Podiatry

## 2019-10-15 DIAGNOSIS — L84 Corns and callosities: Secondary | ICD-10-CM | POA: Diagnosis not present

## 2019-10-15 DIAGNOSIS — E1142 Type 2 diabetes mellitus with diabetic polyneuropathy: Secondary | ICD-10-CM | POA: Diagnosis not present

## 2019-10-15 DIAGNOSIS — E1151 Type 2 diabetes mellitus with diabetic peripheral angiopathy without gangrene: Secondary | ICD-10-CM

## 2019-10-15 DIAGNOSIS — G4733 Obstructive sleep apnea (adult) (pediatric): Secondary | ICD-10-CM | POA: Diagnosis not present

## 2019-10-15 DIAGNOSIS — B351 Tinea unguium: Secondary | ICD-10-CM | POA: Diagnosis not present

## 2019-10-15 DIAGNOSIS — E1169 Type 2 diabetes mellitus with other specified complication: Secondary | ICD-10-CM

## 2019-10-15 NOTE — Progress Notes (Signed)
  Subjective:  Patient ID: Travis Blair, male    DOB: 06/29/1950,  MRN: [id number]  Chief Complaint  Patient presents with  . debride    DFC  . Diabetes    FBS: 106 x Sun A1C: 7 PCP: Hall x 3 mo   69 y.o. male presents with the above complaint. History confirmed with patient. Denies new pedal issues.  Objective:  Physical Exam: warm, good capillary refill, nail exam onychomycosis of the toenails, no trophic changes or ulcerative lesions, normal DP and PT pulses, and absent protective sensation. Left Foot: normal exam, no swelling, tenderness, instability; ligaments intact, full range of motion of all ankle/foot joints  Right Foot: normal exam, no swelling, tenderness, instability; ligaments intact, full range of motion of all ankle/foot joints HPK submet 5  No images are attached to the encounter.  Assessment:   1. Onychomycosis of multiple toenails with type 2 diabetes mellitus and peripheral neuropathy (HCC)   2. Callus   3. Diabetes mellitus type 2 with peripheral artery disease ([place])    Plan:  Patient was evaluated and treated and all questions answered.  Onychomycosis, Diabetes and DPN -Patient is diabetic with a qualifying condition for at risk foot care.  Procedure: Nail Debridement Rationale: Patient meets criteria for routine foot care due to DPN Type of Debridement: manual, sharp debridement. Instrumentation: Nail nipper, rotary burr. Number of Nails: 10   Procedure: Paring of Lesion Rationale: painful hyperkeratotic lesion Type of Debridement: manual, sharp debridement. Instrumentation: 312 blade Number of Lesions: 1   No follow-ups on file.

## 2019-10-20 DIAGNOSIS — L03317 Cellulitis of buttock: Secondary | ICD-10-CM | POA: Diagnosis not present

## 2019-10-20 DIAGNOSIS — G4730 Sleep apnea, unspecified: Secondary | ICD-10-CM | POA: Diagnosis not present

## 2019-10-20 DIAGNOSIS — E1165 Type 2 diabetes mellitus with hyperglycemia: Secondary | ICD-10-CM | POA: Diagnosis not present

## 2019-10-20 DIAGNOSIS — I2510 Atherosclerotic heart disease of native coronary artery without angina pectoris: Secondary | ICD-10-CM | POA: Diagnosis not present

## 2019-10-20 DIAGNOSIS — Z6841 Body Mass Index (BMI) 40.0 and over, adult: Secondary | ICD-10-CM | POA: Diagnosis not present

## 2019-10-20 DIAGNOSIS — E1122 Type 2 diabetes mellitus with diabetic chronic kidney disease: Secondary | ICD-10-CM | POA: Diagnosis not present

## 2019-10-20 DIAGNOSIS — L0231 Cutaneous abscess of buttock: Secondary | ICD-10-CM | POA: Diagnosis not present

## 2019-10-20 DIAGNOSIS — D509 Iron deficiency anemia, unspecified: Secondary | ICD-10-CM | POA: Diagnosis not present

## 2019-10-20 DIAGNOSIS — H05223 Edema of bilateral orbit: Secondary | ICD-10-CM | POA: Diagnosis not present

## 2019-10-20 DIAGNOSIS — I10 Essential (primary) hypertension: Secondary | ICD-10-CM | POA: Diagnosis not present

## 2019-10-26 DIAGNOSIS — E1165 Type 2 diabetes mellitus with hyperglycemia: Secondary | ICD-10-CM | POA: Diagnosis not present

## 2019-10-26 DIAGNOSIS — D509 Iron deficiency anemia, unspecified: Secondary | ICD-10-CM | POA: Diagnosis not present

## 2019-10-26 DIAGNOSIS — I2510 Atherosclerotic heart disease of native coronary artery without angina pectoris: Secondary | ICD-10-CM | POA: Diagnosis not present

## 2019-10-26 DIAGNOSIS — E1122 Type 2 diabetes mellitus with diabetic chronic kidney disease: Secondary | ICD-10-CM | POA: Diagnosis not present

## 2019-10-26 DIAGNOSIS — E875 Hyperkalemia: Secondary | ICD-10-CM | POA: Diagnosis not present

## 2019-10-26 DIAGNOSIS — Z6841 Body Mass Index (BMI) 40.0 and over, adult: Secondary | ICD-10-CM | POA: Diagnosis not present

## 2019-10-26 DIAGNOSIS — I10 Essential (primary) hypertension: Secondary | ICD-10-CM | POA: Diagnosis not present

## 2019-10-26 DIAGNOSIS — E782 Mixed hyperlipidemia: Secondary | ICD-10-CM | POA: Diagnosis not present

## 2019-10-26 DIAGNOSIS — Z23 Encounter for immunization: Secondary | ICD-10-CM | POA: Diagnosis not present

## 2019-10-26 DIAGNOSIS — Z0001 Encounter for general adult medical examination with abnormal findings: Secondary | ICD-10-CM | POA: Diagnosis not present

## 2019-11-05 DIAGNOSIS — E785 Hyperlipidemia, unspecified: Secondary | ICD-10-CM | POA: Diagnosis not present

## 2019-11-05 DIAGNOSIS — E1122 Type 2 diabetes mellitus with diabetic chronic kidney disease: Secondary | ICD-10-CM | POA: Diagnosis not present

## 2019-11-05 DIAGNOSIS — N183 Chronic kidney disease, stage 3 unspecified: Secondary | ICD-10-CM | POA: Diagnosis not present

## 2019-11-05 DIAGNOSIS — N189 Chronic kidney disease, unspecified: Secondary | ICD-10-CM | POA: Diagnosis not present

## 2019-11-05 DIAGNOSIS — I2510 Atherosclerotic heart disease of native coronary artery without angina pectoris: Secondary | ICD-10-CM | POA: Diagnosis not present

## 2019-11-05 DIAGNOSIS — N2581 Secondary hyperparathyroidism of renal origin: Secondary | ICD-10-CM | POA: Diagnosis not present

## 2019-11-14 DIAGNOSIS — G4733 Obstructive sleep apnea (adult) (pediatric): Secondary | ICD-10-CM | POA: Diagnosis not present

## 2019-11-22 DIAGNOSIS — N184 Chronic kidney disease, stage 4 (severe): Secondary | ICD-10-CM | POA: Diagnosis not present

## 2019-11-25 ENCOUNTER — Telehealth: Payer: Self-pay | Admitting: Cardiovascular Disease

## 2019-11-25 DIAGNOSIS — N289 Disorder of kidney and ureter, unspecified: Secondary | ICD-10-CM

## 2019-11-25 DIAGNOSIS — R0609 Other forms of dyspnea: Secondary | ICD-10-CM

## 2019-11-25 MED ORDER — TORSEMIDE 20 MG PO TABS: 20.0000 mg | ORAL_TABLET | Freq: Every day | ORAL | 0 refills | Status: DC

## 2019-11-25 NOTE — Telephone Encounter (Signed)
Lets repeat his echo.  Previous one is from 2018. Make sure he is avoiding all salt - including salty foods ( bacon, sausage, ham, bologna, fast food, take out food, canned food )  Encourage him to get a Animator rest.   Available on [place] or through [email or web address]  .[email or web address]

## 2019-11-25 NOTE — Telephone Encounter (Signed)
Spoke with pt and advised Dr Elmarie Shiley recommendations for leg swelling has been placed on pt's MyChart for viewing.  Reviewed recommendations with pt who states he is cooking meals at home and is eating no salt at all in anything.  Discussed hidden Na+in foods with pt.  Strongly encouraged pt purchase Lounge Dr for leg elevation.  Pt advised he will be contacted re: scheduling echo and appointment with PA as he is currently due for follow up.   Pt verbalizes understanding and agrees with current plan.

## 2019-11-25 NOTE — Telephone Encounter (Signed)
Ok to refill Torsemide He needs to be worked in to see an APP and get an updated BMP in the next several weeks.  Thanks

## 2019-11-25 NOTE — Addendum Note (Signed)
Addended by: Thora Lance on: 11/25/2019 04:17 PM   Modules accepted: Orders

## 2019-11-25 NOTE — Telephone Encounter (Signed)
See previous note    For your  leg edema you  should do  the following 1. Leg elevation - I recommend the Lounge Dr. Leg rest.  See below for details  2. Salt restriction  -  Use potassium chloride instead of regular salt as a salt substitute. 3. Walk regularly 4. Compression hose - [facility] 5. Weight loss    Available on [email or web address] Or  Go to [email or web address]

## 2019-11-25 NOTE — Addendum Note (Signed)
Addended by: Thora Lance on: 11/25/2019 04:21 PM   Modules accepted: Orders

## 2019-11-25 NOTE — Telephone Encounter (Signed)
Spoke with pt who is complaining of increasing SOB x 2 weeks on exertion and lying flat.  Pt states he has been taking Torsemide 20mg  qod since last week after having stopped med for quite sometime with some improvement of lower extremity edema.  Edema also improves after elevation of legs.  Pt reports he had Covid in August and received Regeneron.  Pt also states he has recently seen his PCP and has a history of anemia with current hgb at 10.3 by pt report.  Pt states he is currently following a low sodium diet and sleeps with head of bed minimally elevated.  Pt states he needs a refill of his Torsemide.  Last BMET in [place] with date of 09/02/2019 shows poor renal function.  Will forward information to Dr Acie Fredrickson for review and recommendation.  No open appointments with APP's as of right now until 12/02/2019.  Reviewed ED protocol with pt who verbalizes understanding and agrees with current plan.

## 2019-11-25 NOTE — Telephone Encounter (Addendum)
Torsemide refill sent to pharmacy on file.  Torsemide 20mg  - 1 tablet by mouth daily.  #90 with 0 refills.  Order for BMET placed and linked to pt appt with PA-C on 12/23/2019 at 1145am.  Pt notified medication has been refilled and labs will be drawn at 12/23/2019 appointment with PA-C.  Pt verbalizes understanding and agrees with current plan.

## 2019-11-25 NOTE — Telephone Encounter (Signed)
  Pt c/o Shortness Of Breath: STAT if SOB developed within the last 24 hours or pt is noticeably SOB on the phone  1. Are you currently SOB (can you hear that pt is SOB on the phone)? No  2. How long have you been experiencing SOB? 2 weeks  3. Are you SOB when sitting or when up moving around? Moving around and when laying flat  4. Are you currently experiencing any other symptoms? Swollen legs  Pt said 2 weeks ago he started feeling SOB, he said he gets SOB when up and moving around but it gets worst when he lay flat, he said his legs is swollen but did not gain weight\.

## 2019-12-06 DIAGNOSIS — G720 Drug-induced myopathy: Secondary | ICD-10-CM | POA: Insufficient documentation

## 2019-12-10 NOTE — Addendum Note (Signed)
Addended by: Valentina Shaggy on: 12/10/2019 09:12 AM   Modules accepted: Orders

## 2019-12-10 NOTE — Progress Notes (Signed)
Talked to his wife. He is continuing to have issues with chronic sinusitis. ENT referral placed.  Salvatore Marvel, MD [facility] [place] [facility] [place]

## 2019-12-14 NOTE — Progress Notes (Signed)
Referral faxed on 12/13/2019 to Dr Benjamine Mola for Review and scheduling.

## 2019-12-15 DIAGNOSIS — G4733 Obstructive sleep apnea (adult) (pediatric): Secondary | ICD-10-CM | POA: Diagnosis not present

## 2019-12-16 ENCOUNTER — Encounter (HOSPITAL_COMMUNITY): Payer: Self-pay

## 2019-12-16 ENCOUNTER — Other Ambulatory Visit: Payer: Self-pay

## 2019-12-16 ENCOUNTER — Ambulatory Visit (HOSPITAL_COMMUNITY)
Admission: RE | Admit: 2019-12-16 | Discharge: 2019-12-16 | Disposition: A | Discharge status: Home / Self Care | Payer: PPO | Source: Ambulatory Visit | Attending: Cardiovascular Disease | Admitting: Cardiovascular Disease

## 2019-12-16 ENCOUNTER — Telehealth: Payer: Self-pay | Admitting: Nurse Practitioner

## 2019-12-16 ENCOUNTER — Other Ambulatory Visit: Payer: Self-pay | Admitting: Cardiovascular Disease

## 2019-12-16 ENCOUNTER — Ambulatory Visit (HOSPITAL_BASED_OUTPATIENT_CLINIC_OR_DEPARTMENT_OTHER): Payer: PPO

## 2019-12-16 DIAGNOSIS — R0609 Other forms of dyspnea: Secondary | ICD-10-CM

## 2019-12-16 DIAGNOSIS — R0789 Other chest pain: Secondary | ICD-10-CM

## 2019-12-16 DIAGNOSIS — J9811 Atelectasis: Secondary | ICD-10-CM | POA: Diagnosis not present

## 2019-12-16 DIAGNOSIS — I272 Pulmonary hypertension, unspecified: Secondary | ICD-10-CM | POA: Diagnosis not present

## 2019-12-16 DIAGNOSIS — I517 Cardiomegaly: Secondary | ICD-10-CM | POA: Diagnosis not present

## 2019-12-16 DIAGNOSIS — I739 Peripheral vascular disease, unspecified: Secondary | ICD-10-CM

## 2019-12-16 DIAGNOSIS — R0602 Shortness of breath: Secondary | ICD-10-CM | POA: Diagnosis not present

## 2019-12-16 DIAGNOSIS — J90 Pleural effusion, not elsewhere classified: Secondary | ICD-10-CM | POA: Diagnosis not present

## 2019-12-16 DIAGNOSIS — I2699 Other pulmonary embolism without acute cor pulmonale: Secondary | ICD-10-CM | POA: Diagnosis not present

## 2019-12-16 DIAGNOSIS — R0600 Dyspnea, unspecified: Secondary | ICD-10-CM | POA: Insufficient documentation

## 2019-12-16 DIAGNOSIS — I10 Essential (primary) hypertension: Secondary | ICD-10-CM | POA: Diagnosis not present

## 2019-12-16 LAB — ECHOCARDIOGRAM COMPLETE
Area-P 1/2: 3.65 cm2
S' Lateral: 4.7 cm

## 2019-12-16 MED ORDER — TECHNETIUM TO 99M ALBUMIN AGGREGATED
4.0000 | Freq: Once | INTRAVENOUS | Status: AC | PRN
  Administered 2019-12-16: 4 via INTRAVENOUS

## 2019-12-16 MED ORDER — PERFLUTREN LIPID MICROSPHERE
1.0000 mL | INTRAVENOUS | Status: AC | PRN
  Administered 2019-12-16: 1 mL via INTRAVENOUS

## 2019-12-16 MED ORDER — APIXABAN 5 MG PO TABS
ORAL_TABLET | ORAL | 11 refills | Status: DC
Start: 2019-12-16 — End: 2020-04-12

## 2019-12-16 NOTE — Telephone Encounter (Signed)
Patient presents to office for echo. Was noted to have very high pulmonary pressures by Abe People, echo sonographer and Dr. Acie Fredrickson was notified. Orders for stat CXR and VQ placed and patient was scheduled to go to [facility] for tests at this time. Will order further vascular studies and follow-up with PV due to patient c/o leg pain and stopped Eliquis due to pain in his legs.

## 2019-12-16 NOTE — Progress Notes (Signed)
Mr. Simonetti presented for echocardiogram today. Echo showed severely elevated pulmonary artery pressure. Dr. Acie Fredrickson was notified and he saw the patient after the echo.

## 2019-12-16 NOTE — Telephone Encounter (Signed)
Small right lower lobe pulmonary embolus Lets restart eliquis 10 mg PO BID for 7 days , then 5 mg BID  Called patient to report results of VQ scan above and to take Eliquis 10 mg BID for 7 days and then resume Eliquis 5 mg BID. Patient states he picked up a new Rx of Eliquis and he agrees with plan. He is aware he will receive a call to schedule lower extremity venous duplex and he is aware of his follow-up appointment on 11/11 with Robbie Lis, PA. I advised him to call back prior to appointment with questions or concerns. He thanked me for the call.

## 2019-12-17 ENCOUNTER — Ambulatory Visit: Payer: PPO | Admitting: Podiatry

## 2019-12-21 ENCOUNTER — Other Ambulatory Visit: Payer: Self-pay

## 2019-12-21 ENCOUNTER — Ambulatory Visit: Payer: PPO | Admitting: Podiatry

## 2019-12-21 DIAGNOSIS — E1142 Type 2 diabetes mellitus with diabetic polyneuropathy: Secondary | ICD-10-CM | POA: Diagnosis not present

## 2019-12-21 DIAGNOSIS — B351 Tinea unguium: Secondary | ICD-10-CM | POA: Diagnosis not present

## 2019-12-21 DIAGNOSIS — E1151 Type 2 diabetes mellitus with diabetic peripheral angiopathy without gangrene: Secondary | ICD-10-CM

## 2019-12-21 DIAGNOSIS — E1169 Type 2 diabetes mellitus with other specified complication: Secondary | ICD-10-CM

## 2019-12-21 DIAGNOSIS — L84 Corns and callosities: Secondary | ICD-10-CM

## 2019-12-21 NOTE — Progress Notes (Signed)
  Subjective:  Patient ID: Travis Blair, male    DOB: 1950-03-11,  MRN: [id number]  Chief Complaint  Patient presents with  . Nail Problem    Nail trim 1-5 bilateral  . Callouses    Left posterior, right sub 5th, left sub 1st callouses   69 y.o. male presents with the above complaint. History confirmed with patient.   Objective:  Physical Exam: warm, good capillary refill, nail exam onychomycosis of the toenails, no trophic changes or ulcerative lesions. DP pulses palpable, PT pulses palpable and protective sensation absent Left Foot: HPK left 5th MPJ, hallux medial, flexible hammertoes Right Foot: HPK right 5th MPJ, heel, Hammertoe rigid 2nd toe, other digits flexible  No images are attached to the encounter.  Assessment:   1. Onychomycosis of multiple toenails with type 2 diabetes mellitus and peripheral neuropathy (HCC)   2. Callus   3. Diabetes mellitus type 2 with peripheral artery disease ([place])     Plan:  Patient was evaluated and treated and all questions answered.  Onychomycosis, Diabetes and DPN -Patient is diabetic with a qualifying condition for at risk foot care. -Would benefit from DM shoes. Will make appt for fabrication  Procedure: Nail Debridement Type of Debridement: manual, sharp debridement. Instrumentation: Nail nipper, rotary burr. Number of Nails: 10   Procedure: Paring of Lesion Rationale: painful hyperkeratotic lesion Type of Debridement: manual, sharp debridement. Instrumentation: 312 blade Number of Lesions: 3  Return in about 9 weeks (around 02/22/2020) for Diabetic Foot Care.

## 2019-12-22 ENCOUNTER — Ambulatory Visit (HOSPITAL_COMMUNITY)
Admission: RE | Admit: 2019-12-22 | Discharge: 2019-12-22 | Disposition: A | Discharge status: Home / Self Care | Payer: PPO | Source: Ambulatory Visit | Attending: Internal Medicine | Admitting: Internal Medicine

## 2019-12-22 DIAGNOSIS — N2581 Secondary hyperparathyroidism of renal origin: Secondary | ICD-10-CM | POA: Diagnosis not present

## 2019-12-22 DIAGNOSIS — E785 Hyperlipidemia, unspecified: Secondary | ICD-10-CM | POA: Diagnosis not present

## 2019-12-22 DIAGNOSIS — R0789 Other chest pain: Secondary | ICD-10-CM | POA: Diagnosis not present

## 2019-12-22 DIAGNOSIS — I739 Peripheral vascular disease, unspecified: Secondary | ICD-10-CM | POA: Diagnosis not present

## 2019-12-22 DIAGNOSIS — R0609 Other forms of dyspnea: Secondary | ICD-10-CM

## 2019-12-22 DIAGNOSIS — E1122 Type 2 diabetes mellitus with diabetic chronic kidney disease: Secondary | ICD-10-CM | POA: Diagnosis not present

## 2019-12-22 DIAGNOSIS — R600 Localized edema: Secondary | ICD-10-CM

## 2019-12-22 DIAGNOSIS — N189 Chronic kidney disease, unspecified: Secondary | ICD-10-CM | POA: Diagnosis not present

## 2019-12-22 DIAGNOSIS — N183 Chronic kidney disease, stage 3 unspecified: Secondary | ICD-10-CM | POA: Diagnosis not present

## 2019-12-22 DIAGNOSIS — I2510 Atherosclerotic heart disease of native coronary artery without angina pectoris: Secondary | ICD-10-CM | POA: Diagnosis not present

## 2019-12-22 DIAGNOSIS — R0600 Dyspnea, unspecified: Secondary | ICD-10-CM | POA: Insufficient documentation

## 2019-12-22 NOTE — Progress Notes (Signed)
Cardiology Office Note  Date:  12/23/2019   ID:  Travis Blair, DOB [birth year], MRN [id number]  PCP:  [name], MD  Cardiologist:  Dr. Acie Fredrickson  _____________  6 month follow-up  _____________   History of Present Illness: Travis Blair is a 69 y.o. male with pmh of CAD s/p CABG x 3 in January 2018, post-op afib with short course of Eliquis amd amiodarone, HLD, history of unprovoked DVT in 2019, CKD followed by nephrology, OSA on CPAP, DM2 who presents for follow-up.   The patient was last seen 03/2019 by Dr. Acie Fredrickson. He had stopped atorvastatin due to leg craps. Also stopped Eliquis on his own. With history of unprovoked DVT it was recommended he start Eliquis. Now on PCSK9 inhibitor.   Had COVID 19 09/2019. Received monoclonal antibody. He is vaccinated.   Called 11/25/19 for SOB and LE edema. He tried the Lounge Dr. Leg rest but did not tolerated and returned. Echo 12/16/2019 showed LVEF of 45% and grade II DD. There is severely elevated pulmonary artery systolic pressure. Follow up VQ scan showed RLL PE. Started on Eliquis. LE doppler yesterday showed indeterminate non-occlusive deep vein  thrombosis involving the distal left femoral vein, and left proximal popliteal vein.   Seen by Dr. [name] yesterday. Had BMET BUN 57 Scr 3.03 K 5.2 Albumin 3.2  Here today for follow up. Breathing and LE edema improving. Did not tolerated Lounge Dr. Leg due to orthopnea. Weight down from 320>>301lb. No chest pain. Reports low sodium diet. No chest pain. Has Le edema, orthopnea and PND. No palpitations or melena.   Past Medical History:  Diagnosis Date  . Anemia associated with chronic renal failure basically  . Asthma    as a child  . CHF (congestive heart failure) ([place])   . Chronic kidney disease    Sees Dr [name]  . Constipation   . Coronary artery disease   . Diabetes mellitus without complication ([place])   . DVT (deep venous thrombosis) ([place])   . History of blood clots   .  Hyperlipidemia   . Hypertension   . Leg pain    ABIs/LE Arterial US 03/2019: ABIs unreliable; +calcification, no evidence of stenosis  . Myocardial infarction ([place])   . Obesity   . Pneumonia 2018  . Sleep apnea   . SOB (shortness of breath)   . Swelling of both lower extremities    Past Surgical History:  Procedure Laterality Date  . COLONOSCOPY    . CORONARY ARTERY BYPASS GRAFT N/A 03/07/2017   Procedure: CORONARY ARTERY BYPASS GRAFTING (CABG), X 3 , USING RIGHT INTERNAL MAMMARY ARTERY, AND RIGHT LEG GREATER SAPHENOUS VEIN HARVESTED ENDOSCOPICALLY;  Surgeon: [name], MD;  Location: [facility];  Service: Open Heart Surgery;  Laterality: N/A;  . HAND SURGERY  1991  . RIGHT/LEFT HEART CATH AND CORONARY ANGIOGRAPHY N/A 12/20/2016   Procedure: RIGHT/LEFT HEART CATH AND CORONARY ANGIOGRAPHY;  Surgeon: [name], MD;  Location: [facility];  Service: Cardiovascular;  Laterality: N/A;  . TEE WITHOUT CARDIOVERSION N/A 03/07/2017   Procedure: TRANSESOPHAGEAL ECHOCARDIOGRAM (TEE);  Surgeon: [name], [name], MD;  Location: [facility];  Service: Open Heart Surgery;  Laterality: N/A;  . TONSILLECTOMY     _____________  Current Outpatient Medications  Medication Sig Dispense Refill  . apixaban (ELIQUIS) 5 MG TABS tablet Take 10 mg (2 tablets) twice daily for one week starting 11/4 then take 5 mg (1 tablet) twice daily 60 tablet  11  . carvedilol (COREG) 6.25 MG tablet TAKE 1 TABLET (6.25 MG TOTAL) BY MOUTH 2 (TWO) TIMES DAILY WITH A MEAL. 180 tablet 1  . cetirizine (ZYRTEC) 10 MG tablet Take 10 mg by mouth at bedtime.    Marland Kitchen doxycycline (VIBRA-TABS) 100 MG tablet Take 100 mg by mouth daily.    . Evolocumab (REPATHA SURECLICK) 470 MG/ML SOAJ Inject 1 pen into the skin every 14 (fourteen) days. 6 pen 3  . glipiZIDE (GLUCOTROL) 5 MG tablet Take 5 mg by mouth daily before breakfast.    . ondansetron (ZOFRAN) 4 MG tablet Take 4 mg by mouth every 6 (six) hours as needed.    . torsemide  (DEMADEX) 20 MG tablet Take 1 tablet (20 mg total) by mouth daily. 90 tablet 0  . hydrALAZINE (APRESOLINE) 25 MG tablet Take 1 tablet (25 mg total) by mouth in the morning and at bedtime. 180 tablet 3  . icosapent Ethyl (VASCEPA) 1 g capsule Take 2 capsules (2 g total) by mouth 2 (two) times daily. (Patient not taking: Reported on 12/23/2019) 360 capsule 3  . isosorbide mononitrate (IMDUR) 30 MG 24 hr tablet Take 1 tablet (30 mg total) by mouth daily. 45 tablet 3   No current facility-administered medications for this visit.   _____________   Allergies:   Atorvastatin  _____________   Social History:  The patient  reports that he has never smoked. He has never used smokeless tobacco. He reports that he does not drink alcohol and does not use drugs.  _____________   Family History:  The patient's family history includes Cancer in his father, mother, and another family member; Congenital heart disease in his father; Diabetes in his mother; Heart disease in his father, mother, and another family member; Hyperlipidemia in an other family member; Hypertension in his father and another family member; Lymphoma in his sister.  _____________   ROS:  Please see the history of present illness.     PHYSICAL EXAM: VS:  BP (!) 166/100   Pulse 74   Ht 6\' 1"  (1.854 m)   Wt (!) 301 lb 6.4 oz (136.7 kg)   SpO2 98%   BMI 39.76 kg/m  , BMI Body mass index is 39.76 kg/m. GEN: Well nourished, well developed, in no acute distress  HEENT: normal  Neck: carotid bruits, or masses.  JVD difficult to evaluate due to body habitus Cardiac: RRR; no murmurs, rubs, or gallops. No clubbing, cyanosis.  2+ bilateral lower extremity edema with compression stocking on.  Radials/DP/PT 2+ and equal bilaterally.  Respiratory:  clear to auscultation bilaterally, normal work of breathing GI: soft, nontender, + distended, + BS MS: no deformity or atrophy  Skin: warm and dry, no rash Neuro:  Strength and sensation are  intact Psych: euthymic mood, full affect _____________  EKG:   The ekg did not ordered today.   Recent Labs: 08/25/2019: ALT 9 09/02/2019: BUN 51; Creatinine, Ser 2.82; Hemoglobin 11.6; Platelets 231; Potassium 5.2; Sodium 138; TSH 3.140  08/25/2019: Chol/HDL Ratio 3.7; Cholesterol, Total 133; HDL 36; LDL Chol Calc (NIH) 73; Triglycerides 133  CrCl cannot be calculated (Patient's most recent lab result is older than the maximum 21 days allowed.).  Wt Readings from Last 3 Encounters:  12/23/19 (!) 301 lb 6.4 oz (136.7 kg)  09/02/19 (!) 315 lb (142.9 kg)  03/23/19 (!) 330 lb 9.6 oz (150 kg)    Relevant Studies: Echo 12/2019 1. Left ventricular ejection fraction, by estimation, is 45%. The left  ventricle has mildly decreased function. The left ventricle demonstrates  global hypokinesis. There is moderate left ventricular hypertrophy. Left  ventricular diastolic parameters are  consistent with Grade II diastolic dysfunction (pseudonormalization).  2. Right ventricular systolic function is mildly reduced. The right  ventricular size is normal. There is severely elevated pulmonary artery  systolic pressure. The estimated right ventricular systolic pressure is  94.8 mmHg.  3. Left atrial size was mildly dilated.  4. Right atrial size was mildly dilated.  5. The mitral valve is normal in structure. Mild mitral valve  regurgitation. No evidence of mitral stenosis.  6. The aortic valve is tricuspid. Aortic valve regurgitation is not  visualized. Mild aortic valve sclerosis is present, with no evidence of  aortic valve stenosis.  7. Aortic dilatation noted. There is mild dilatation of the ascending  aorta, measuring 41 mm.  8. The inferior vena cava is dilated in size with <50% respiratory  variability, suggesting right atrial pressure of 15 mmHg.  9. Technically difficult study with poor acoustic windows. The views of  the LV were poor even with Definity use.   R/L heart cath  12/2016 Severe multivessel coronary artery disease Mildly elevated LVEDP  Recommendation: CT Surgery referral for consideration for CABG  Lower extremity Dopplers 12/22/19 Summary:  RIGHT:  - No evidence of deep vein thrombosis in the lower extremity. No indirect  evidence of obstruction proximal to the inguinal ligament.  - Portions of this examination were limited- see technologist comments  above.  - No cystic structure found in the popliteal fossa.  - Moderate superficial edema in mid to low posteromedial calf.    LEFT:  - Findings consistent with age indeterminate non-occlusive deep vein  thrombosis involving the distal left femoral vein, and left proximal  popliteal vein.  - Portions of this examination were limited- see technologist comments  above.  - No cystic structure found in the popliteal fossa.  - Moderate superficial edema in mid to low posteromedial calf.   _____________   ASSESSMENT AND PLAN:  1. CAD s/p CABG in January 2018 - No angina. Continue BB and Repatha. Not on ASA due to need of anticoagulation.   2. CKD stage III Follows with nephrology (dr. [name]). Seems baseline was 2.2-2.4 this summer, most recently 2.85 on 08/2019 and 3.03 yesterday (will scan lab in [place]). Per Dr. Jason Nest note, not a candidate for peritoneal dialysis.   3.  Acute on chronic combined CHF -  Echo 12/16/2019 showed LVEF of 45% and grade II DD. There is severely elevated pulmonary artery systolic pressure. -Symptoms improving since started taking torsemide 20 mg daily. -Did not tolerated lounge Dr. Bertram Gala -Continue torsemide at current dose.  We recommended management by nephrologist given steady rise in creatinine. -Avoid nephrotoxic agent -Continue Coreg 6.25 mg twice daily -Add Imdur/hydralazine for afterload reduction - Repots low sodium diet - Continue compression stockings  4.  Hypertension -Elevated -Continue Coreg -Add Imdur and hydralazine as above  5.  Pulmonary  embolism and DVT -Prior history of unprovoked DVT in 2019 with history of noncompliance with Eliquis -Now diagnosed with PE and DVT>>> discussed importance of anticoagulation with Eliquis, likely lifelong (prefer management by primary care provider) -Seems poor insight  6. Diabetes mellitus -Per PCP.  7. HLD - 08/25/2019: Cholesterol, Total 133; HDL 36; LDL Chol Calc (NIH) 73; Triglycerides 133  - Continue Repatha  Signed, Leanor Kail, PA-C 12/23/2019 1:28 PM    _____________ [facility] [address] [place]  [place] [address]  [phone] (office) [phone] (fax)

## 2019-12-23 ENCOUNTER — Encounter: Payer: Self-pay | Admitting: Physician Assistant

## 2019-12-23 ENCOUNTER — Ambulatory Visit (INDEPENDENT_AMBULATORY_CARE_PROVIDER_SITE_OTHER): Payer: PPO | Admitting: Physician Assistant

## 2019-12-23 ENCOUNTER — Other Ambulatory Visit: Payer: Self-pay

## 2019-12-23 VITALS — BP 166/100 | HR 74 | Ht 73.0 in | Wt 301.4 lb

## 2019-12-23 DIAGNOSIS — E785 Hyperlipidemia, unspecified: Secondary | ICD-10-CM

## 2019-12-23 DIAGNOSIS — I2601 Septic pulmonary embolism with acute cor pulmonale: Secondary | ICD-10-CM | POA: Diagnosis not present

## 2019-12-23 DIAGNOSIS — N1832 Chronic kidney disease, stage 3b: Secondary | ICD-10-CM | POA: Diagnosis not present

## 2019-12-23 DIAGNOSIS — I2510 Atherosclerotic heart disease of native coronary artery without angina pectoris: Secondary | ICD-10-CM

## 2019-12-23 DIAGNOSIS — I10 Essential (primary) hypertension: Secondary | ICD-10-CM

## 2019-12-23 DIAGNOSIS — E782 Mixed hyperlipidemia: Secondary | ICD-10-CM

## 2019-12-23 DIAGNOSIS — I5042 Chronic combined systolic (congestive) and diastolic (congestive) heart failure: Secondary | ICD-10-CM | POA: Diagnosis not present

## 2019-12-23 DIAGNOSIS — E1169 Type 2 diabetes mellitus with other specified complication: Secondary | ICD-10-CM | POA: Diagnosis not present

## 2019-12-23 DIAGNOSIS — Z86718 Personal history of other venous thrombosis and embolism: Secondary | ICD-10-CM | POA: Diagnosis not present

## 2019-12-23 DIAGNOSIS — I480 Paroxysmal atrial fibrillation: Secondary | ICD-10-CM | POA: Diagnosis not present

## 2019-12-23 MED ORDER — HYDRALAZINE HCL 25 MG PO TABS
25.0000 mg | ORAL_TABLET | Freq: Two times a day (BID) | ORAL | 3 refills | Status: DC
Start: 2019-12-23 — End: 2020-01-17

## 2019-12-23 MED ORDER — ISOSORBIDE MONONITRATE ER 30 MG PO TB24
30.0000 mg | ORAL_TABLET | Freq: Every day | ORAL | 3 refills | Status: DC
Start: 2019-12-23 — End: 2020-01-17

## 2019-12-23 NOTE — Patient Instructions (Addendum)
Medication Instructions:  Your physician has recommended you make the following change in your medication:  1.  START Imdur 30 mg taking 1/2 tablet daily 2.  START Hydralazine 25 mg taking 1 tablet twice a day   *If you need a refill on your cardiac medications before your next appointment, please call your pharmacy*   Lab Work: None ordered  If you have labs (blood work) drawn today and your tests are completely normal, you will receive your results only by: Marland Kitchen MyChart Message (if you have MyChart) OR . A paper copy in the mail If you have any lab test that is abnormal or we need to change your treatment, we will call you to review the results.   Testing/Procedures: None ordered   Follow-Up: At [facility], you and your health needs are our priority.  As part of our continuing mission to provide you with exceptional heart care, we have created designated Provider Care Teams.  These Care Teams include your primary Cardiologist (physician) and Advanced Practice Providers (APPs -  Physician Assistants and Nurse Practitioners) who all work together to provide you with the care you need, when you need it.  We recommend signing up for the patient portal called "MyChart".  Sign up information is provided on this After Visit Summary.  MyChart is used to connect with patients for Virtual Visits (Telemedicine).  Patients are able to view lab/test results, encounter notes, upcoming appointments, etc.  Non-urgent messages can be sent to your provider as well.   To learn more about what you can do with MyChart, go to [email or web address].    Your next appointment:   3 month(s)  The format for your next appointment:   In Person  Provider:   You may see [name], MD or one of the following Advanced Practice Providers on your designated Care Team:    Richardson Dopp, PA-C  Robbie Lis, [place]    Other Instructions  Monitor your blood pressure for 10 days and send the readings in

## 2020-01-03 ENCOUNTER — Telehealth: Payer: Self-pay

## 2020-01-03 NOTE — Telephone Encounter (Signed)
The patient has been notified of the result and verbalized understanding.  All questions (if any) were answered.  Wilma Flavin, RN 01/03/2020 3:08 PM

## 2020-01-03 NOTE — Telephone Encounter (Signed)
-----   Message from Thayer Headings, MD sent at 01/03/2020  1:40 PM EST ----- Mild chronic combined S/D CHF Severe pulmonary HTN ( he has had a recent Pulmonary embolus when he stopped his eliquis )  We have started diuretic, restarted Eliquis . Cont. Current meds.

## 2020-01-05 ENCOUNTER — Ambulatory Visit: Payer: PPO | Admitting: Orthotics

## 2020-01-05 ENCOUNTER — Other Ambulatory Visit: Payer: Self-pay

## 2020-01-05 DIAGNOSIS — L84 Corns and callosities: Secondary | ICD-10-CM

## 2020-01-05 DIAGNOSIS — M21961 Unspecified acquired deformity of right lower leg: Secondary | ICD-10-CM

## 2020-01-05 DIAGNOSIS — E1151 Type 2 diabetes mellitus with diabetic peripheral angiopathy without gangrene: Secondary | ICD-10-CM

## 2020-01-05 NOTE — Progress Notes (Signed)
Patient had appointment today for definitive and final diabetic shoe fitting and delivery.  Patient was seen by Benjie Karvonen, C.Ped, O[facility].   The inserts fit well and accomplished full contact with the plantar surface of the foot bilateral; the shoes fit well and offered forefoot freedom, no noticible heel slippage, and good toe clearance w/ the insert in place.  Patient was advised to monitor of any skin irritation, breakdown.  Patient was satisfied with fit and function.  SCANNED

## 2020-01-07 ENCOUNTER — Other Ambulatory Visit: Payer: Self-pay | Admitting: Cardiovascular Disease

## 2020-01-11 ENCOUNTER — Other Ambulatory Visit: Payer: Self-pay | Admitting: Physician Assistant

## 2020-01-13 DIAGNOSIS — H5203 Hypermetropia, bilateral: Secondary | ICD-10-CM | POA: Diagnosis not present

## 2020-01-13 DIAGNOSIS — E113413 Type 2 diabetes mellitus with severe nonproliferative diabetic retinopathy with macular edema, bilateral: Secondary | ICD-10-CM | POA: Diagnosis not present

## 2020-01-18 ENCOUNTER — Encounter (INDEPENDENT_AMBULATORY_CARE_PROVIDER_SITE_OTHER): Payer: PPO | Admitting: Ophthalmology

## 2020-01-20 ENCOUNTER — Other Ambulatory Visit: Payer: Self-pay

## 2020-01-20 ENCOUNTER — Encounter (INDEPENDENT_AMBULATORY_CARE_PROVIDER_SITE_OTHER): Payer: Self-pay | Admitting: Ophthalmology

## 2020-01-20 ENCOUNTER — Ambulatory Visit (INDEPENDENT_AMBULATORY_CARE_PROVIDER_SITE_OTHER): Payer: PPO | Admitting: Ophthalmology

## 2020-01-20 DIAGNOSIS — G4733 Obstructive sleep apnea (adult) (pediatric): Secondary | ICD-10-CM

## 2020-01-20 DIAGNOSIS — E113412 Type 2 diabetes mellitus with severe nonproliferative diabetic retinopathy with macular edema, left eye: Secondary | ICD-10-CM | POA: Insufficient documentation

## 2020-01-20 DIAGNOSIS — E113411 Type 2 diabetes mellitus with severe nonproliferative diabetic retinopathy with macular edema, right eye: Secondary | ICD-10-CM | POA: Diagnosis not present

## 2020-01-20 DIAGNOSIS — H43821 Vitreomacular adhesion, right eye: Secondary | ICD-10-CM | POA: Insufficient documentation

## 2020-01-20 DIAGNOSIS — G4730 Sleep apnea, unspecified: Secondary | ICD-10-CM | POA: Insufficient documentation

## 2020-01-20 DIAGNOSIS — H43822 Vitreomacular adhesion, left eye: Secondary | ICD-10-CM | POA: Diagnosis not present

## 2020-01-20 MED ORDER — BEVACIZUMAB 2.5 MG/0.1ML IZ SOSY
2.5000 mg | PREFILLED_SYRINGE | INTRAVITREAL | Status: AC | PRN
  Administered 2020-01-20: 2.5 mg via INTRAVITREAL

## 2020-01-20 NOTE — Assessment & Plan Note (Signed)
Very severe nonproliferative diabetic retinopathy and macular edema each eye accounting for his acuity but most importantly probably made worse recently with Covid pulmonary disease and what sounds like a pulmonary embolism.  This hypoxic events probably hastened the progression of diabetic eye disease in each eye.

## 2020-01-20 NOTE — Assessment & Plan Note (Signed)
Will observe OU as this might be a function of no previous PVD, as well as massive CME from his hypoxic disease made worse by recent pulmonary vascular disease, shunting effect thereof physiologically leading to systemic hypoxia particularly retinopathy

## 2020-01-20 NOTE — Assessment & Plan Note (Signed)
Patient highly compliant with the use of sleep apnea machine and gets good read of compliance from his machine  20-year history

## 2020-01-20 NOTE — Progress Notes (Signed)
01/20/2020     CHIEF COMPLAINT Patient presents for Retina Evaluation (NP CME OU ref'd by Dr. Valetta Close.///Pt reports that since [place] in August, vision has gotten worse and  there is light sensitivity, no pain or pressure, no f/f. ////Last A1C: 6.3 taken 10/2019////Last BS: 105 yesterday. )   HISTORY OF PRESENT ILLNESS: Travis Blair is a 69 y.o. male who presents to the clinic today for:   HPI    Retina Evaluation    In both eyes.  This started 4 months ago.  Duration of 4 months.  Context:  distance vision and near vision. Additional comments: NP CME OU ref'd by Dr. Valetta Close.   Pt reports that since [place] in August, vision has gotten worse and  there is light sensitivity, no pain or pressure, no f/f.     Last A1C: 6.3 taken 10/2019    Last BS: 105 yesterday.        Last edited by Nichola Sizer D on 01/20/2020  2:04 PM. (History)      Referring physician: [name], MD [place],  [place] [id number]  HISTORICAL INFORMATION:   Selected notes from the MEDICAL RECORD NUMBER    Lab Results  Component Value Date   HGBA1C 7.0 (H) 09/02/2019     CURRENT MEDICATIONS: No current outpatient medications on file. (Ophthalmic Drugs)   No current facility-administered medications for this visit. (Ophthalmic Drugs)   Current Outpatient Medications (Other)  Medication Sig  . apixaban (ELIQUIS) 5 MG TABS tablet Take 10 mg (2 tablets) twice daily for one week starting 11/4 then take 5 mg (1 tablet) twice daily  . carvedilol (COREG) 6.25 MG tablet TAKE 1 TABLET (6.25 MG TOTAL) BY MOUTH 2 (TWO) TIMES DAILY WITH A MEAL.  . cetirizine (ZYRTEC) 10 MG tablet Take 10 mg by mouth at bedtime.  Marland Kitchen doxycycline (VIBRA-TABS) 100 MG tablet Take 100 mg by mouth daily.  . Evolocumab (REPATHA SURECLICK) 322 MG/ML SOAJ Inject 1 pen into the skin every 14 (fourteen) days.  Marland Kitchen glipiZIDE (GLUCOTROL) 5 MG tablet Take 5 mg by mouth daily before breakfast.  . icosapent Ethyl  (VASCEPA) 1 g capsule Take 2 capsules (2 g total) by mouth 2 (two) times daily. (Patient not taking: Reported on 12/23/2019)  . ondansetron (ZOFRAN) 4 MG tablet Take 4 mg by mouth every 6 (six) hours as needed.  . torsemide (DEMADEX) 20 MG tablet TAKE 1 TABLET BY MOUTH EVERY DAY   No current facility-administered medications for this visit. (Other)      REVIEW OF SYSTEMS:    ALLERGIES Allergies  Allergen Reactions  . Atorvastatin     Leg aches     PAST MEDICAL HISTORY Past Medical History:  Diagnosis Date  . Anemia associated with chronic renal failure basically  . Asthma    as a child  . CHF (congestive heart failure) ([place])   . Chronic kidney disease    Sees Dr [name]  . Constipation   . Coronary artery disease   . Diabetes mellitus without complication ([place])   . DVT (deep venous thrombosis) ([place])   . History of blood clots   . Hyperlipidemia   . Hypertension   . Leg pain    ABIs/LE Arterial US 03/2019: ABIs unreliable; +calcification, no evidence of stenosis  . Myocardial infarction ([place])   . Obesity   . Pneumonia 2018  . Sleep apnea   . SOB (shortness of breath)   . Swelling of both  lower extremities    Past Surgical History:  Procedure Laterality Date  . COLONOSCOPY    . CORONARY ARTERY BYPASS GRAFT N/A 03/07/2017   Procedure: CORONARY ARTERY BYPASS GRAFTING (CABG), X 3 , USING RIGHT INTERNAL MAMMARY ARTERY, AND RIGHT LEG GREATER SAPHENOUS VEIN HARVESTED ENDOSCOPICALLY;  Surgeon: [name], MD;  Location: [facility];  Service: Open Heart Surgery;  Laterality: N/A;  . HAND SURGERY  1991  . RIGHT/LEFT HEART CATH AND CORONARY ANGIOGRAPHY N/A 12/20/2016   Procedure: RIGHT/LEFT HEART CATH AND CORONARY ANGIOGRAPHY;  Surgeon: [name], MD;  Location: [facility];  Service: Cardiovascular;  Laterality: N/A;  . TEE WITHOUT CARDIOVERSION N/A 03/07/2017   Procedure: TRANSESOPHAGEAL ECHOCARDIOGRAM (TEE);  Surgeon: [name], [name], MD;  Location: [facility];   Service: Open Heart Surgery;  Laterality: N/A;  . TONSILLECTOMY      FAMILY HISTORY Family History  Problem Relation Age of Onset  . Cancer Other   . Hypertension Other   . Hyperlipidemia Other   . Heart disease Other   . Cancer Mother   . Heart disease Mother   . Diabetes Mother   . Cancer Father   . Heart disease Father   . Hypertension Father   . Congenital heart disease Father   . Lymphoma Sister     SOCIAL HISTORY Social History   Tobacco Use  . Smoking status: Never Smoker  . Smokeless tobacco: Never Used  Vaping Use  . Vaping Use: Never used  Substance Use Topics  . Alcohol use: No  . Drug use: No         OPHTHALMIC EXAM: Base Eye Exam    Visual Acuity (ETDRS)      Right Left   Dist cc 20/20 -1 20/30 -1   Dist ph cc  NI   Correction: Glasses       Tonometry (Tonopen, 2:09 PM)      Right Left   Pressure 15 15       Pupils      Pupils Dark Light Shape React APD   Right PERRL 3 2 Round Brisk None   Left PERRL 3 2 Round Brisk None       Visual Fields (Counting fingers)      Left Right    Full Full       Extraocular Movement      Right Left    Full Full       Neuro/Psych    Oriented x3: Yes       Dilation    Both eyes: 1.0% Mydriacyl, 2.5% Phenylephrine @ 2:09 PM        Slit Lamp and Fundus Exam    External Exam      Right Left   External Normal Normal       Slit Lamp Exam      Right Left   Lids/Lashes Normal Normal   Conjunctiva/Sclera White and quiet White and quiet   Cornea Clear Clear   Anterior Chamber Deep and quiet Deep and quiet   Iris Round and reactive Round and reactive   Lens 2+ Nuclear sclerosis 2+ Nuclear sclerosis   Anterior Vitreous Normal Normal       Fundus Exam      Right Left   Posterior Vitreous Normal Normal   Disc Normal Normal   C/D Ratio 0.25 0.25   Macula Severe clinically significant macular edema, Macular thickening, Microaneurysms Severe clinically significant macular edema, diffuse with  posterior pole cotton-wool  spots capillary dropout" swamp edema", Macular thickening, Microaneurysms   Vessels NPDR-Severe, with severe retinal nonperfusion capillary dropout with glassy opacification of the peripheral retina NPDR-Severe, with severe retinal nonperfusion capillary dropout with glassy opacification of the peripheral retina   Periphery Cotton wool spots Cotton wool spots          IMAGING AND PROCEDURES  Imaging and Procedures for 01/20/20  OCT, Retina - OU - Both Eyes       Right Eye Quality was good. Scan locations included subfoveal. Central Foveal Thickness: 550. Progression has no prior data. Findings include vitreomacular adhesion , cystoid macular edema.   Left Eye Quality was good. Scan locations included subfoveal. Central Foveal Thickness: 622. Progression has no prior data. Findings include vitreomacular adhesion , cystoid macular edema.   Notes Massive clinically significant macular edema with serous subretinal fluid accumulation subfoveal left eye on the basis of very severe nonproliferative diabetic retinopathy and likely early proliferative disease       Intravitreal Injection, Pharmacologic Agent - OS - Left Eye       Time Out 01/20/2020. 3:09 PM. Confirmed correct patient, procedure, site, and patient consented.   Anesthesia Topical anesthesia was used. Anesthetic medications included [place] 3.5%.   Procedure Preparation included Ofloxacin , 10% betadine to eyelids, 5% betadine to ocular surface. A 30 gauge needle was used.   Injection:  2.5 mg Bevacizumab (AVASTIN) 2.62m/0.1mL SOSY   NDC: 755732-202-54 Lot:: [id number]  Route: Intravitreal, Site: Left Eye  Post-op Post injection exam found visual acuity of at least counting fingers. The patient tolerated the procedure well. There were no complications. The patient received written and verbal post procedure care education. Post injection medications were not given.                  ASSESSMENT/PLAN:  Sleep apnea Patient highly compliant with the use of sleep apnea machine and gets good read of compliance from his machine  20-year history  Severe nonproliferative diabetic retinopathy of right eye, with macular edema, associated with type 2 diabetes mellitus ([place] Very severe nonproliferative diabetic retinopathy and macular edema each eye accounting for his acuity but most importantly probably made worse recently with Covid pulmonary disease and what sounds like a pulmonary embolism.  This hypoxic events probably hastened the progression of diabetic eye disease in each eye.  Vitreomacular adhesion of right eye Will observe OU as this might be a function of no previous PVD, as well as massive CME from his hypoxic disease made worse by recent pulmonary vascular disease, shunting effect thereof physiologically leading to systemic hypoxia particularly retinopathy      ICD-10-CM   1. Severe nonproliferative diabetic retinopathy of left eye, with macular edema, associated with type 2 diabetes mellitus (HCC)  ES28.3151OCT, Retina - OU - Both Eyes    Intravitreal Injection, Pharmacologic Agent - OS - Left Eye    bevacizumab (AVASTIN) SOSY 2.5 mg  2. Severe nonproliferative diabetic retinopathy of right eye, with macular edema, associated with type 2 diabetes mellitus (HCC)  E11.3411 OCT, Retina - OU - Both Eyes  3. Obstructive sleep apnea syndrome  G47.33   4. Vitreomacular adhesion of left eye  H43.822   5. Vitreomacular adhesion of right eye  H43.821     1.  We will commence therapy to stabilize and hopefully improve the rather massive clinically significant MAC edema left eye today  2.  I explained the patient that fortunately he is starting with excellent visual acuity and  has a good prognosis for recovery of such  3.  I explained that the use of his CPAP for sleep apnea is also critical for to maintain optimal oxygenation of the macula but also systemically for the CNS  and other options of healing post Covid and  post Covid lung disease  Dilate OU next for performance of fluorescein angiography OU to document the extent of retinal vascular damage from diabetic retinopathy and preparation for Avastin OD  #4 I did explain that there is a chronicity to the therapy to control his condition and bring it under control.  Ophthalmic Meds Ordered this visit:  Meds ordered this encounter  Medications  . bevacizumab (AVASTIN) SOSY 2.5 mg       Return in about 2 weeks (around 02/03/2020) for DILATE OU, OPTOS FFA R/L,,,,,,, COLOR FP,,,,, AVASTIN OCT, OD.  There are no Patient Instructions on file for this visit.   Explained the diagnoses, plan, and follow up with the patient and they expressed understanding.  Patient expressed understanding of the importance of proper follow up care.   Clent Demark Nalu Troublefield M.D. Diseases & Surgery of the Retina and Vitreous Retina & Diabetic [facility] 01/20/20     Abbreviations: M myopia (nearsighted); A astigmatism; H hyperopia (farsighted); P presbyopia; Mrx spectacle prescription;  CTL contact lenses; OD right eye; OS left eye; OU both eyes  XT exotropia; ET esotropia; PEK punctate epithelial keratitis; PEE punctate epithelial erosions; DES dry eye syndrome; MGD meibomian gland dysfunction; ATs artificial tears; PFAT's preservative free artificial tears; [employer] nuclear sclerotic cataract; PSC posterior subcapsular cataract; ERM epi-retinal membrane; PVD posterior vitreous detachment; RD retinal detachment; DM diabetes mellitus; DR diabetic retinopathy; NPDR non-proliferative diabetic retinopathy; PDR proliferative diabetic retinopathy; CSME clinically significant macular edema; DME diabetic macular edema; dbh dot blot hemorrhages; CWS cotton wool spot; POAG primary open angle glaucoma; C/D cup-to-disc ratio; HVF humphrey visual field; GVF goldmann visual field; OCT optical coherence tomography; IOP intraocular pressure; BRVO Branch  retinal vein occlusion; CRVO central retinal vein occlusion; CRAO central retinal artery occlusion; BRAO branch retinal artery occlusion; RT retinal tear; SB scleral buckle; PPV pars plana vitrectomy; VH Vitreous hemorrhage; PRP panretinal laser photocoagulation; IVK intravitreal kenalog; VMT vitreomacular traction; MH Macular hole;  NVD neovascularization of the disc; NVE neovascularization elsewhere; AREDS age related eye disease study; ARMD age related macular degeneration; POAG primary open angle glaucoma; EBMD epithelial/anterior basement membrane dystrophy; ACIOL anterior chamber intraocular lens; IOL intraocular lens; PCIOL posterior chamber intraocular lens; Phaco/IOL phacoemulsification with intraocular lens placement; Ol[place] [employer] [place]o photorefractive keratectomy; LASIK laser assisted in situ keratomileusis; HTN hypertension; DM diabetes mellitus; COPD chronic obstructive pulmonary disease

## 2020-01-27 ENCOUNTER — Other Ambulatory Visit: Payer: Self-pay

## 2020-01-27 ENCOUNTER — Ambulatory Visit (INDEPENDENT_AMBULATORY_CARE_PROVIDER_SITE_OTHER): Payer: PPO | Admitting: Family Medicine

## 2020-01-27 VITALS — BP 140/80 | HR 74 | Temp 97.6°F | Ht 73.0 in | Wt 309.0 lb

## 2020-01-27 DIAGNOSIS — R0602 Shortness of breath: Secondary | ICD-10-CM | POA: Diagnosis not present

## 2020-01-27 DIAGNOSIS — E782 Mixed hyperlipidemia: Secondary | ICD-10-CM | POA: Diagnosis not present

## 2020-01-27 DIAGNOSIS — I10 Essential (primary) hypertension: Secondary | ICD-10-CM | POA: Diagnosis not present

## 2020-01-27 DIAGNOSIS — E871 Hypo-osmolality and hyponatremia: Secondary | ICD-10-CM | POA: Diagnosis not present

## 2020-01-27 DIAGNOSIS — R5383 Other fatigue: Secondary | ICD-10-CM

## 2020-01-27 DIAGNOSIS — E1169 Type 2 diabetes mellitus with other specified complication: Secondary | ICD-10-CM

## 2020-01-27 DIAGNOSIS — Z951 Presence of aortocoronary bypass graft: Secondary | ICD-10-CM

## 2020-01-27 NOTE — Progress Notes (Signed)
Subjective:    Patient ID: Travis Blair, male    DOB: 11-19-1950, 69 y.o.   MRN: [id number]  HPI Patient is a very pleasant 69 year old Caucasian male here today to establish care.  Past medical history is significant for coronary artery bypass grafting performed in either 2018 or 2019 per the patient's report.  He is also had 2 separate DVTs.  Most recently he was diagnosed 1 month ago with a pulmonary embolism as well as pulmonary hypertension due to the pulmonary embolism.  He also has combined systolic diastolic congestive heart failure.  On an echocardiogram in November, his ejection fraction was found to be 45%.  He also has a history of stage IV chronic kidney disease managed by Dr. [name].  His creatinine tends to hover around 3.  He was also previously admitted last year with a hemoglobin of 6.9 ultimately requiring transfusion.  Patient is uncertain of why his hemoglobin became so low.  He denies having had any kind of GI work-up or starting erythropoietin or IV iron.  He is not on any iron supplements at the present time.  However he is on Eliquis.  He also has a history of type 2 diabetes mellitus for which she takes glipizide.  His blood sugars in the morning are typically around 80.  He reports several attacks throughout the day where he feels extremely lightheaded and weak.  He is not sure if it is his blood pressure dropping or his blood sugar dropping.  He is not checking his heart rate or blood pressure or blood sugar during these events.  He denies any syncope but he does feel lightheaded and weak and needs to sit and rest when these attacks happen. Past Medical History:  Diagnosis Date  . Anemia associated with chronic renal failure basically  . Asthma    as a child  . CHF (congestive heart failure) ([facility])   . Chronic kidney disease    Sees Dr [name]  . Constipation   . Coronary artery disease   . Diabetes mellitus without complication ([place])   . DVT (deep venous thrombosis)  ([facility])   . History of blood clots   . Hyperlipidemia   . Hypertension   . Leg pain    ABIs/LE Arterial US 03/2019: ABIs unreliable; +calcification, no evidence of stenosis  . Myocardial infarction ([facility])   . Obesity   . Pneumonia 2018  . Sleep apnea   . SOB (shortness of breath)   . Swelling of both lower extremities    Past Surgical History:  Procedure Laterality Date  . COLONOSCOPY    . CORONARY ARTERY BYPASS GRAFT N/A 03/07/2017   Procedure: CORONARY ARTERY BYPASS GRAFTING (CABG), X 3 , USING RIGHT INTERNAL MAMMARY ARTERY, AND RIGHT LEG GREATER SAPHENOUS VEIN HARVESTED ENDOSCOPICALLY;  Surgeon: [name], MD;  Location: [facility];  Service: Open Heart Surgery;  Laterality: N/A;  . HAND SURGERY  1991  . RIGHT/LEFT HEART CATH AND CORONARY ANGIOGRAPHY N/A 12/20/2016   Procedure: RIGHT/LEFT HEART CATH AND CORONARY ANGIOGRAPHY;  Surgeon: [name], MD;  Location: [facility];  Service: Cardiovascular;  Laterality: N/A;  . TEE WITHOUT CARDIOVERSION N/A 03/07/2017   Procedure: TRANSESOPHAGEAL ECHOCARDIOGRAM (TEE);  Surgeon: [name], [name], MD;  Location: [facility];  Service: Open Heart Surgery;  Laterality: N/A;  . TONSILLECTOMY     Current Outpatient Medications on File Prior to Visit  Medication Sig Dispense Refill  . apixaban (ELIQUIS) 5 MG TABS tablet Take  10 mg (2 tablets) twice daily for one week starting 11/4 then take 5 mg (1 tablet) twice daily 60 tablet 11  . carvedilol (COREG) 6.25 MG tablet TAKE 1 TABLET (6.25 MG TOTAL) BY MOUTH 2 (TWO) TIMES DAILY WITH A MEAL. 180 tablet 1  . Evolocumab (REPATHA SURECLICK) 811 MG/ML SOAJ Inject 1 pen into the skin every 14 (fourteen) days. 6 pen 3  . glipiZIDE (GLUCOTROL) 5 MG tablet Take 5 mg by mouth daily before breakfast.    . icosapent Ethyl (VASCEPA) 1 g capsule Take 2 capsules (2 g total) by mouth 2 (two) times daily. 360 capsule 3  . torsemide (DEMADEX) 20 MG tablet TAKE 1 TABLET BY MOUTH EVERY DAY 90 tablet 3  .  cetirizine (ZYRTEC) 10 MG tablet Take 10 mg by mouth at bedtime. (Patient not taking: Reported on 01/27/2020)    . ondansetron (ZOFRAN) 4 MG tablet Take 4 mg by mouth every 6 (six) hours as needed. (Patient not taking: Reported on 01/27/2020)     No current facility-administered medications on file prior to visit.   Allergies  Allergen Reactions  . Atorvastatin     Leg aches    Social History   Socioeconomic History  . Marital status: Married    Spouse name: Not on file  . Number of children: Not on file  . Years of education: Not on file  . Highest education level: Not on file  Occupational History  . Occupation: Clinical biochemist  Tobacco Use  . Smoking status: Never Smoker  . Smokeless tobacco: Never Used  Vaping Use  . Vaping Use: Never used  Substance and Sexual Activity  . Alcohol use: No  . Drug use: No  . Sexual activity: Never  Other Topics Concern  . Not on file  Social History Narrative  . Not on file   Social Determinants of Health   Financial Resource Strain: Not on file  Food Insecurity: Not on file  Transportation Needs: Not on file  Physical Activity: Not on file  Stress: Not on file  Social Connections: Not on file  Intimate Partner Violence: Not on file   Family History  Problem Relation Age of Onset  . Cancer Other   . Hypertension Other   . Hyperlipidemia Other   . Heart disease Other   . Cancer Mother   . Heart disease Mother   . Diabetes Mother   . Cancer Father   . Heart disease Father   . Hypertension Father   . Congenital heart disease Father   . Lymphoma Sister       Review of Systems  All other systems reviewed and are negative.      Objective:   Physical Exam Vitals reviewed.  Constitutional:      General: He is not in acute distress.    Appearance: He is obese. He is not ill-appearing, toxic-appearing or diaphoretic.  HENT:     Head: Normocephalic and atraumatic.  Cardiovascular:     Rate and Rhythm: Normal  rate.     Heart sounds: Normal heart sounds. No murmur heard. No friction rub. No gallop.   Pulmonary:     Effort: Pulmonary effort is normal. No respiratory distress.     Breath sounds: Normal breath sounds. No stridor. No wheezing, rhonchi or rales.  Abdominal:     General: Bowel sounds are normal. There is no distension.     Palpations: Abdomen is soft.     Tenderness: There is no abdominal tenderness.  There is no guarding.  Musculoskeletal:     Right lower leg: Edema present.     Left lower leg: Edema present.  Neurological:     General: No focal deficit present.     Mental Status: He is alert and oriented to person, place, and time.     Cranial Nerves: No cranial nerve deficit.     Sensory: No sensory deficit.     Motor: No weakness.     Coordination: Coordination normal.     Gait: Gait normal.  Psychiatric:        Mood and Affect: Mood normal.        Behavior: Behavior normal.        Thought Content: Thought content normal.        Judgment: Judgment normal.           Assessment & Plan:  Type 2 diabetes mellitus with other specified complication, without long-term current use of insulin (HCC) - Plan: CBC with Differential/Platelet, COMPLETE METABOLIC PANEL WITH GFR, Hemoglobin A1c, Anemia panel, Iron, TIBC and Ferritin Panel, B12 and Folate Panel, Reticulocytes  S/P CABG x 3 - Plan: CBC with Differential/Platelet, COMPLETE METABOLIC PANEL WITH GFR, Hemoglobin A1c, Anemia panel, Iron, TIBC and Ferritin Panel, B12 and Folate Panel, Reticulocytes  Essential hypertension - Plan: CBC with Differential/Platelet, COMPLETE METABOLIC PANEL WITH GFR, Hemoglobin A1c, Anemia panel, Iron, TIBC and Ferritin Panel, B12 and Folate Panel, Reticulocytes  SOB (shortness of breath) on exertion - Plan: CBC with Differential/Platelet, COMPLETE METABOLIC PANEL WITH GFR, Hemoglobin A1c, Anemia panel, Iron, TIBC and Ferritin Panel, B12 and Folate Panel, Reticulocytes  Other fatigue - Plan: CBC  with Differential/Platelet, COMPLETE METABOLIC PANEL WITH GFR, Hemoglobin A1c, Anemia panel, Iron, TIBC and Ferritin Panel, B12 and Folate Panel, Reticulocytes  Mixed hyperlipidemia - Plan: Iron, TIBC and Ferritin Panel, B12 and Folate Panel, Reticulocytes  Start by checking CBC to rule out anemia.  Given his stage IV chronic kidney disease, he certainly runs a risk of having iron deficiency anemia or erythropoietin deficiency anemia.  If his hemoglobin is dropping further this may be contributing some to his dyspnea on exertion.  Recheck a CMP to monitor his potassium and renal function.  Check an A1c.  Also believe the patient is at high risk for having hypoglycemic episodes on glipizide which may be causing some of his dizziness and weakness.  If his A1c is acceptable, I would temporarily recommend stopping the glipizide and seeing if some of these attacks improve.  Of asked the patient to check his blood pressure, blood sugar, and heart rate every time he has 1 of these attacks.  My concern would be either a drop in his blood pressure, cardiac arrhythmia such as bradycardia or A. fib, or hypoglycemia as a potential cause of these "weak spells".  Patient will call me back in 1 week with an update after holding the glipizide to see how he is feeling.  If he continues to have these attacks I would recommend an ambulatory blood pressure monitor and may be even an event monitor to rule out cardiac arrhythmias.

## 2020-01-28 ENCOUNTER — Other Ambulatory Visit: Payer: Self-pay | Admitting: *Deleted

## 2020-01-28 ENCOUNTER — Ambulatory Visit: Payer: PPO | Admitting: Family Medicine

## 2020-01-28 DIAGNOSIS — D509 Iron deficiency anemia, unspecified: Secondary | ICD-10-CM

## 2020-01-28 LAB — HEMOGLOBIN A1C
Hgb A1c MFr Bld: 6.3 % of total Hgb — ABNORMAL HIGH (ref <5.7)
Mean Plasma Glucose: 134 mg/dL
eAG (mmol/L): 7.4 mmol/L

## 2020-01-28 LAB — COMPLETE METABOLIC PANEL WITH GFR
AG Ratio: 1.3 (calc) (ref 1.0–2.5)
ALT: 10 U/L (ref 9–46)
AST: 14 U/L (ref 10–35)
Albumin: 3 g/dL — ABNORMAL LOW (ref 3.6–5.1)
Alkaline phosphatase (APISO): 53 U/L (ref 35–144)
BUN/Creatinine Ratio: 17 (calc) (ref 6–22)
BUN: 53 mg/dL — ABNORMAL HIGH (ref 7–25)
CO2: 23 mmol/L (ref 20–32)
Calcium: 8.2 mg/dL — ABNORMAL LOW (ref 8.6–10.3)
Chloride: 110 mmol/L (ref 98–110)
Creat: 3.14 mg/dL — ABNORMAL HIGH (ref 0.70–1.25)
GFR, Est African American: 22 mL/min/{1.73_m2} — ABNORMAL LOW (ref >=60)
GFR, Est Non African American: 19 mL/min/{1.73_m2} — ABNORMAL LOW (ref >=60)
Globulin: 2.4 g/dL (calc) (ref 1.9–3.7)
Glucose, Bld: 82 mg/dL (ref 65–99)
Potassium: 5.6 mmol/L — ABNORMAL HIGH (ref 3.5–5.3)
Sodium: 138 mmol/L (ref 135–146)
Total Bilirubin: 0.3 mg/dL (ref 0.2–1.2)
Total Protein: 5.4 g/dL — ABNORMAL LOW (ref 6.1–8.1)

## 2020-01-28 LAB — IRON,TIBC AND FERRITIN PANEL
%SAT: 6 % (calc) — ABNORMAL LOW (ref 20–48)
Ferritin: 11 ng/mL — ABNORMAL LOW (ref 24–380)
Iron: 21 ug/dL — ABNORMAL LOW (ref 50–180)
TIBC: 359 mcg/dL (calc) (ref 250–425)

## 2020-01-28 LAB — CBC WITH DIFFERENTIAL/PLATELET
Absolute Monocytes: 578 cells/uL (ref 200–950)
Basophils Absolute: 18 cells/uL (ref 0–200)
Basophils Relative: 0.3 %
Eosinophils Absolute: 201 cells/uL (ref 15–500)
Eosinophils Relative: 3.4 %
HCT: 30.9 % — ABNORMAL LOW (ref 38.5–50.0)
Hemoglobin: 9.5 g/dL — ABNORMAL LOW (ref 13.2–17.1)
Lymphs Abs: 1056 cells/uL (ref 850–3900)
MCH: 26 pg — ABNORMAL LOW (ref 27.0–33.0)
MCHC: 30.7 g/dL — ABNORMAL LOW (ref 32.0–36.0)
MCV: 84.7 fL (ref 80.0–100.0)
MPV: 10.6 fL (ref 7.5–12.5)
Monocytes Relative: 9.8 %
Neutro Abs: 4047 cells/uL (ref 1500–7800)
Neutrophils Relative %: 68.6 %
Platelets: 264 10*3/uL (ref 140–400)
RBC: 3.65 10*6/uL — ABNORMAL LOW (ref 4.20–5.80)
RDW: 14.4 % (ref 11.0–15.0)
Total Lymphocyte: 17.9 %
WBC: 5.9 10*3/uL (ref 3.8–10.8)

## 2020-01-28 LAB — B12 AND FOLATE PANEL
Folate: 9.3 ng/mL
Vitamin B-12: 253 pg/mL (ref 200–1100)

## 2020-01-28 LAB — RETICULOCYTES
ABS Retic: 61370 cells/uL (ref 25000–9000)
Retic Ct Pct: 1.7 %

## 2020-01-28 MED ORDER — FERROUS SULFATE 325 (65 FE) MG PO TABS: 325.0000 mg | ORAL_TABLET | Freq: Every day | ORAL | 3 refills | Status: AC

## 2020-01-28 MED ORDER — VITAMIN B-12 100 MCG PO TABS
100.0000 ug | ORAL_TABLET | Freq: Every day | ORAL | Status: DC
Start: 2020-01-28 — End: 2020-04-03

## 2020-02-03 ENCOUNTER — Encounter (INDEPENDENT_AMBULATORY_CARE_PROVIDER_SITE_OTHER): Payer: Self-pay | Admitting: Ophthalmology

## 2020-02-03 ENCOUNTER — Ambulatory Visit (INDEPENDENT_AMBULATORY_CARE_PROVIDER_SITE_OTHER): Payer: PPO | Admitting: Ophthalmology

## 2020-02-03 ENCOUNTER — Encounter (INDEPENDENT_AMBULATORY_CARE_PROVIDER_SITE_OTHER): Payer: PPO | Admitting: Ophthalmology

## 2020-02-03 ENCOUNTER — Other Ambulatory Visit: Payer: Self-pay

## 2020-02-03 DIAGNOSIS — E113412 Type 2 diabetes mellitus with severe nonproliferative diabetic retinopathy with macular edema, left eye: Secondary | ICD-10-CM | POA: Diagnosis not present

## 2020-02-03 DIAGNOSIS — H35033 Hypertensive retinopathy, bilateral: Secondary | ICD-10-CM

## 2020-02-03 DIAGNOSIS — G4733 Obstructive sleep apnea (adult) (pediatric): Secondary | ICD-10-CM

## 2020-02-03 DIAGNOSIS — E113411 Type 2 diabetes mellitus with severe nonproliferative diabetic retinopathy with macular edema, right eye: Secondary | ICD-10-CM | POA: Diagnosis not present

## 2020-02-03 MED ORDER — BEVACIZUMAB 2.5 MG/0.1ML IZ SOSY
2.5000 mg | PREFILLED_SYRINGE | INTRAVITREAL | Status: AC | PRN
  Administered 2020-02-03: 2.5 mg via INTRAVITREAL

## 2020-02-03 MED ORDER — FLUORESCEIN SODIUM 10 % IV SOLN
500.0000 mg | INTRAVENOUS | Status: AC | PRN
  Administered 2020-02-03: 500 mg via INTRAVENOUS

## 2020-02-03 NOTE — Assessment & Plan Note (Signed)
Significant hypertensive retinopathy changes with posterior pole cotton-wool spots PERI papillary and in the macular region.  I explained to the patient that these are typically hypertensive retinopathy superimposed upon severe diabetic retinopathy but also could be caused by incompletely treated sleep apnea with its attendant hypoxemia and hypertensive episodes nightly

## 2020-02-03 NOTE — Assessment & Plan Note (Signed)
Patient reports excellent compliance with his CPAP machine.  Patient does have a newer machine with newer software feedback confirming excellent fit and oxygenation nightly

## 2020-02-03 NOTE — Progress Notes (Signed)
02/03/2020     CHIEF COMPLAINT Patient presents for Retina Follow Up (2 Week F/U OU, FP/FFA R/L, poss Avastin OD//Pt sts OS has improved since last visit. Pt denies ocular pain, flashes of light, or floaters OU. //LBS: 134 yesterday)   HISTORY OF PRESENT ILLNESS: Travis Blair is a 69 y.o. male who presents to the clinic today for:   HPI    Retina Follow Up    Patient presents with  Diabetic Retinopathy.  In both eyes.  This started 2 weeks ago.  Severity is mild.  Duration of 2 weeks.  Since onset it is gradually improving. Additional comments: 2 Week F/U OU, FP/FFA R/L, poss Avastin OD  Pt sts OS has improved since last visit. Pt denies ocular pain, flashes of light, or floaters OU.   LBS: 134 yesterday       Last edited by Rockie Neighbours, Reynolds on 02/03/2020  8:08 AM. (History)      Referring physician: [name], MD [place],  [place] [id number]  HISTORICAL INFORMATION:   Selected notes from the MEDICAL RECORD NUMBER    Lab Results  Component Value Date   HGBA1C 6.3 (H) 01/27/2020     CURRENT MEDICATIONS: No current outpatient medications on file. (Ophthalmic Drugs)   No current facility-administered medications for this visit. (Ophthalmic Drugs)   Current Outpatient Medications (Other)  Medication Sig  . apixaban (ELIQUIS) 5 MG TABS tablet Take 10 mg (2 tablets) twice daily for one week starting 11/4 then take 5 mg (1 tablet) twice daily  . carvedilol (COREG) 6.25 MG tablet TAKE 1 TABLET (6.25 MG TOTAL) BY MOUTH 2 (TWO) TIMES DAILY WITH A MEAL.  . cetirizine (ZYRTEC) 10 MG tablet Take 10 mg by mouth at bedtime. (Patient not taking: Reported on 01/27/2020)  . Evolocumab (REPATHA SURECLICK) 665 MG/ML SOAJ Inject 1 pen into the skin every 14 (fourteen) days.  . ferrous sulfate (FERROUSUL) 325 (65 FE) MG tablet Take 1 tablet (325 mg total) by mouth daily with breakfast.  . glipiZIDE (GLUCOTROL) 5 MG tablet Take 5 mg by mouth daily before  breakfast.  . icosapent Ethyl (VASCEPA) 1 g capsule Take 2 capsules (2 g total) by mouth 2 (two) times daily.  . ondansetron (ZOFRAN) 4 MG tablet Take 4 mg by mouth every 6 (six) hours as needed. (Patient not taking: Reported on 01/27/2020)  . torsemide (DEMADEX) 20 MG tablet TAKE 1 TABLET BY MOUTH EVERY DAY  . vitamin B-12 (CYANOCOBALAMIN) 100 MCG tablet Take 1 tablet (100 mcg total) by mouth daily.   No current facility-administered medications for this visit. (Other)      REVIEW OF SYSTEMS:    ALLERGIES Allergies  Allergen Reactions  . Atorvastatin     Leg aches     PAST MEDICAL HISTORY Past Medical History:  Diagnosis Date  . Anemia associated with chronic renal failure basically  . Asthma    as a child  . CHF (congestive heart failure) ([place])   . Chronic kidney disease    Sees Dr [name]  . Constipation   . Coronary artery disease   . Diabetes mellitus without complication ([place])   . DVT (deep venous thrombosis) ([place])   . History of blood clots   . Hyperlipidemia   . Hypertension   . Leg pain    ABIs/LE Arterial US 03/2019: ABIs unreliable; +calcification, no evidence of stenosis  . Myocardial infarction ([place])   . Obesity   . Pneumonia  2018  . Sleep apnea   . SOB (shortness of breath)   . Swelling of both lower extremities    Past Surgical History:  Procedure Laterality Date  . COLONOSCOPY    . CORONARY ARTERY BYPASS GRAFT N/A 03/07/2017   Procedure: CORONARY ARTERY BYPASS GRAFTING (CABG), X 3 , USING RIGHT INTERNAL MAMMARY ARTERY, AND RIGHT LEG GREATER SAPHENOUS VEIN HARVESTED ENDOSCOPICALLY;  Surgeon: [name], MD;  Location: [facility];  Service: Open Heart Surgery;  Laterality: N/A;  . HAND SURGERY  1991  . RIGHT/LEFT HEART CATH AND CORONARY ANGIOGRAPHY N/A 12/20/2016   Procedure: RIGHT/LEFT HEART CATH AND CORONARY ANGIOGRAPHY;  Surgeon: [name], MD;  Location: [facility];  Service: Cardiovascular;  Laterality: N/A;  . TEE WITHOUT  CARDIOVERSION N/A 03/07/2017   Procedure: TRANSESOPHAGEAL ECHOCARDIOGRAM (TEE);  Surgeon: [name], [name], MD;  Location: [facility];  Service: Open Heart Surgery;  Laterality: N/A;  . TONSILLECTOMY      FAMILY HISTORY Family History  Problem Relation Age of Onset  . Cancer Other   . Hypertension Other   . Hyperlipidemia Other   . Heart disease Other   . Cancer Mother   . Heart disease Mother   . Diabetes Mother   . Cancer Father   . Heart disease Father   . Hypertension Father   . Congenital heart disease Father   . Lymphoma Sister     SOCIAL HISTORY Social History   Tobacco Use  . Smoking status: Never Smoker  . Smokeless tobacco: Never Used  Vaping Use  . Vaping Use: Never used  Substance Use Topics  . Alcohol use: No  . Drug use: No         OPHTHALMIC EXAM: Base Eye Exam    Visual Acuity (ETDRS)      Right Left   Dist cc 20/30 20/25 -2   Dist ph cc NI    Correction: Glasses       Tonometry (Tonopen, 8:08 AM)      Right Left   Pressure 19 16       Pupils      Pupils Dark Light Shape React APD   Right PERRL 3 2 Round Brisk None   Left PERRL 3 2 Round Brisk None       Visual Fields (Counting fingers)      Left Right    Full Full       Extraocular Movement      Right Left    Full Full       Neuro/Psych    Oriented x3: Yes       Dilation    Both eyes: 1.0% Mydriacyl, 2.5% Phenylephrine @ 8:10 AM        Slit Lamp and Fundus Exam    External Exam      Right Left   External Normal Normal       Slit Lamp Exam      Right Left   Lids/Lashes Normal Normal   Conjunctiva/Sclera White and quiet White and quiet   Cornea Clear Clear   Anterior Chamber Deep and quiet Deep and quiet   Iris Round and reactive Round and reactive   Lens 2+ Nuclear sclerosis 2+ Nuclear sclerosis   Anterior Vitreous Normal Normal       Fundus Exam      Right Left   Posterior Vitreous Normal Normal   Disc Normal Normal   C/D Ratio 0.25 0.25   Macula Severe  clinically significant macular edema, Macular thickening, Microaneurysms Severe clinically significant macular edema, diffuse with posterior pole cotton-wool spots capillary dropout" swamp edema", Macular thickening, Microaneurysms   Vessels NPDR-Severe, with severe retinal nonperfusion capillary dropout with glassy opacification of the peripheral retina NPDR-Severe, with severe retinal nonperfusion capillary dropout with glassy opacification of the peripheral retina   Periphery Cotton wool spots Cotton wool spots          IMAGING AND PROCEDURES  Imaging and Procedures for 02/03/20  OCT, Retina - OU - Both Eyes       Right Eye Quality was good. Scan locations included subfoveal. Central Foveal Thickness: 529. Progression has improved. Findings include abnormal foveal contour, cystoid macular edema, vitreomacular adhesion .   Left Eye Quality was good. Scan locations included subfoveal. Central Foveal Thickness: 336. Progression has improved. Findings include abnormal foveal contour, cystoid macular edema, vitreomacular adhesion .   Notes OS, vastly improved CSME by over 300 m, status post intravitreal Avastin No. 1,    2 weeks prior  OD with slightly and proved CSME yet with center involvement still active for planned intravitreal Avastin today       Fluorescein Angiography Optos (Transit OD)       Injection:  500 mg Fluorescein Sodium 10 % injection   NDC: [phone]   Route: IntravenousRight Eye   Progression has no prior data. Early phase findings include leakage, microaneurysm. Mid/Late phase findings include leakage, microaneurysm, vascular perfusion defect. Choroidal neovascularization is not present.   Left Eye   Progression has no prior data. Mid/Late phase findings include leakage, vascular perfusion defect. Choroidal neovascularization is not present.   Notes OD with diabetic CSME  Severe posterior pole regions of capillary nonperfusion in the areas of  cotton-wool spots, hypertensive retinopathy  OS similar findings yet CSME likely improved as the left eye is 2 weeks post intravitreal Avastin       Color Fundus Photography Optos - OU - Both Eyes       Right Eye Progression has no prior data. Disc findings include normal observations. Macula : exudates, microaneurysms. Vessels : IRMA. Periphery : hemorrhage.   Left Eye Progression has no prior data. Macula : microaneurysms. Vessels : IRMA. Periphery : hemorrhage.   Notes Bilateral diabetic CSME with associated severe nonproliferative diabetic retinopathy  Significant hypertensive retinopathy changes with posterior pole cotton-wool spots PERI papillary and in the macular region.  I explained to the patient that these are typically hypertensive retinopathy superimposed upon severe diabetic retinopathy but also could be caused by incompletely treated sleep apnea with its attendant hypoxemia and hypertensive episodes nightly       Intravitreal Injection, Pharmacologic Agent - OD - Right Eye       Time Out 02/03/2020. 8:57 AM. Confirmed correct patient, procedure, site, and patient consented.   Anesthesia Topical anesthesia was used. Anesthetic medications included Akten 3.5%.   Procedure Preparation included Tobramycin 0.3%, 10% betadine to eyelids, 5% betadine to ocular surface. A 30 gauge needle was used.   Injection:  2.5 mg Bevacizumab (AVASTIN) 2.5mg /0.28mL SOSY   NDC: 62947-654-65, Lot: [id number]   Route: Intravitreal, Site: Right Eye  Post-op Post injection exam found visual acuity of at least counting fingers. The patient tolerated the procedure well. There were no complications. The patient received written and verbal post procedure care education. Post injection medications were not given.                 ASSESSMENT/PLAN:  Sleep apnea Patient reports excellent  compliance with his CPAP machine.  Patient does have a newer machine with newer software feedback  confirming excellent fit and oxygenation nightly  Hypertensive retinopathy of both eyes Significant hypertensive retinopathy changes with posterior pole cotton-wool spots PERI papillary and in the macular region.  I explained to the patient that these are typically hypertensive retinopathy superimposed upon severe diabetic retinopathy but also could be caused by incompletely treated sleep apnea with its attendant hypoxemia and hypertensive episodes nightly      ICD-10-CM   1. Severe nonproliferative diabetic retinopathy of right eye, with macular edema, associated with type 2 diabetes mellitus (HCC)  E11.3411 OCT, Retina - OU - Both Eyes    Fluorescein Angiography Optos (Transit OD)    Color Fundus Photography Optos - OU - Both Eyes    Fluorescein Sodium 10 % injection 500 mg    Intravitreal Injection, Pharmacologic Agent - OD - Right Eye    bevacizumab (AVASTIN) SOSY 2.5 mg  2. Severe nonproliferative diabetic retinopathy of left eye, with macular edema, associated with type 2 diabetes mellitus (HCC)  T24.5809 OCT, Retina - OU - Both Eyes    Fluorescein Angiography Optos (Transit OD)    Color Fundus Photography Optos - OU - Both Eyes    Fluorescein Sodium 10 % injection 500 mg  3. Obstructive sleep apnea syndrome  G47.33   4. Hypertensive retinopathy of both eyes  H35.033     1.  OD plan injection of vitreal Avastin, first injection today , will replan injection right eye evaluation in 5 to 6 weeks  2.  OS 2 weeks status post injection Avastin, much improved, follow-up in 1 month for evaluation and dilate left eye  3.  Ophthalmic Meds Ordered this visit:  Meds ordered this encounter  Medications  . Fluorescein Sodium 10 % injection 500 mg  . bevacizumab (AVASTIN) SOSY 2.5 mg       Return in about 4 weeks (around 03/02/2020) for dilate, OS, AVASTIN OCT.  There are no Patient Instructions on file for this visit.   Explained the diagnoses, plan, and follow up with the patient  and they expressed understanding.  Patient expressed understanding of the importance of proper follow up care.   Clent Demark Nathanel Tallman M.D. Diseases & Surgery of the Retina and Vitreous [facility] 02/03/20     Abbreviations: M myopia (nearsighted); A astigmatism; H hyperopia (farsighted); P presbyopia; Mrx spectacle prescription;  CTL contact lenses; OD right eye; OS left eye; OU both eyes  XT exotropia; ET esotropia; PEK punctate epithelial keratitis; PEE punctate epithelial erosions; DES dry eye syndrome; MGD meibomian gland dysfunction; ATs artificial tears; PFAT's preservative free artificial tears; [place] nuclear sclerotic cataract; PSC posterior subcapsular cataract; ERM epi-retinal membrane; PVD posterior vitreous detachment; RD retinal detachment; DM diabetes mellitus; DR diabetic retinopathy; NPDR non-proliferative diabetic retinopathy; PDR proliferative diabetic retinopathy; CSME clinically significant macular edema; DME diabetic macular edema; dbh dot blot hemorrhages; CWS cotton wool spot; POAG primary open angle glaucoma; C/D cup-to-disc ratio; HVF humphrey visual field; GVF goldmann visual field; OCT optical coherence tomography; IOP intraocular pressure; BRVO Branch retinal vein occlusion; CRVO central retinal vein occlusion; CRAO central retinal artery occlusion; BRAO branch retinal artery occlusion; RT retinal tear; SB scleral buckle; PPV pars plana vitrectomy; VH Vitreous hemorrhage; PRP panretinal laser photocoagulation; IVK intravitreal kenalog; VMT vitreomacular traction; MH Macular hole;  NVD neovascularization of the disc; NVE neovascularization elsewhere; AREDS age related eye disease study; ARMD age related macular degeneration; POAG primary open  angle glaucoma; EBMD epithelial/anterior basement membrane dystrophy; ACIOL anterior chamber intraocular lens; IOL intraocular lens; PCIOL posterior chamber intraocular lens; Phaco/IOL phacoemulsification with intraocular lens  placement; [place] photorefractive keratectomy; LASIK laser assisted in situ keratomileusis; HTN hypertension; DM diabetes mellitus; COPD chronic obstructive pulmonary disease

## 2020-02-18 ENCOUNTER — Encounter: Payer: Self-pay | Admitting: Family Medicine

## 2020-02-18 ENCOUNTER — Other Ambulatory Visit: Payer: Self-pay | Admitting: Family Medicine

## 2020-02-18 MED ORDER — GLIPIZIDE 5 MG PO TABS: 5.0000 mg | ORAL_TABLET | Freq: Every day | ORAL | 1 refills | Status: DC

## 2020-02-22 ENCOUNTER — Ambulatory Visit (INDEPENDENT_AMBULATORY_CARE_PROVIDER_SITE_OTHER): Payer: PPO | Admitting: Podiatry

## 2020-02-22 ENCOUNTER — Other Ambulatory Visit: Payer: Self-pay

## 2020-02-22 DIAGNOSIS — E1142 Type 2 diabetes mellitus with diabetic polyneuropathy: Secondary | ICD-10-CM

## 2020-02-22 DIAGNOSIS — B351 Tinea unguium: Secondary | ICD-10-CM

## 2020-02-22 DIAGNOSIS — E1169 Type 2 diabetes mellitus with other specified complication: Secondary | ICD-10-CM

## 2020-02-22 NOTE — Progress Notes (Signed)
  Subjective:  Patient ID: Travis Blair, male    DOB: 1950-08-31,  MRN: [id number]  Chief Complaint  Patient presents with  . Nail Problem    RFC - Pt. States edema has improved but still has some in both feet.    69 y.o. male presents with the above complaint. History confirmed with patient.   Objective:  Physical Exam: warm, good capillary refill, nail exam onychomycosis of the toenails, no trophic changes or ulcerative lesions. DP pulses palpable, PT pulses palpable and protective sensation absent Left Foot: HPK left 5th MPJ, hallux medial, flexible hammertoes Right Foot: HPK right 5th MPJ, heel, Hammertoe rigid 2nd toe, other digits flexible  No images are attached to the encounter.  Assessment:   1. Onychomycosis of multiple toenails with type 2 diabetes mellitus and peripheral neuropathy ([place])     Plan:  Patient was evaluated and treated and all questions answered.  Onychomycosis, Diabetes and DPN -Patient is diabetic with a qualifying condition for at risk foot care. -Pending new DM shoes  Procedure: Nail Debridement Type of Debridement: manual, sharp debridement. Instrumentation: Nail nipper, rotary burr. Number of Nails: 10  Return in about 3 months (around 05/22/2020) for Diabetic Foot Care.

## 2020-03-02 ENCOUNTER — Encounter (INDEPENDENT_AMBULATORY_CARE_PROVIDER_SITE_OTHER): Payer: PPO | Admitting: Ophthalmology

## 2020-03-06 ENCOUNTER — Encounter: Payer: Self-pay | Admitting: Family Medicine

## 2020-03-10 ENCOUNTER — Ambulatory Visit (INDEPENDENT_AMBULATORY_CARE_PROVIDER_SITE_OTHER): Payer: PPO | Admitting: Family Medicine

## 2020-03-10 ENCOUNTER — Other Ambulatory Visit: Payer: Self-pay

## 2020-03-10 VITALS — BP 164/88 | HR 77 | Temp 97.5°F | Resp 17 | Ht 73.0 in | Wt 309.0 lb

## 2020-03-10 DIAGNOSIS — D509 Iron deficiency anemia, unspecified: Secondary | ICD-10-CM | POA: Diagnosis not present

## 2020-03-10 DIAGNOSIS — E1169 Type 2 diabetes mellitus with other specified complication: Secondary | ICD-10-CM | POA: Diagnosis not present

## 2020-03-10 DIAGNOSIS — R5383 Other fatigue: Secondary | ICD-10-CM | POA: Diagnosis not present

## 2020-03-10 NOTE — Progress Notes (Signed)
Subjective:    Patient ID: Travis Blair, male    DOB: Feb 20, 1950, 70 y.o.   MRN: [id number]  HPI  01/27/20 Patient is a very pleasant 70 year old Caucasian male here today to establish care.  Past medical history is significant for coronary artery bypass grafting performed in either 2018 or 2019 per the patient's report.  He is also had 2 separate DVTs.  Most recently he was diagnosed 1 month ago with a pulmonary embolism as well as pulmonary hypertension due to the pulmonary embolism.  He also has combined systolic diastolic congestive heart failure.  On an echocardiogram in November, his ejection fraction was found to be 45%.  He also has a history of stage IV chronic kidney disease managed by Dr. [name].  His creatinine tends to hover around 3.  He was also previously admitted last year with a hemoglobin of 6.9 ultimately requiring transfusion.  Patient is uncertain of why his hemoglobin became so low.  He denies having had any kind of GI work-up or starting erythropoietin or IV iron.  He is not on any iron supplements at the present time.  However he is on Eliquis.  He also has a history of type 2 diabetes mellitus for which she takes glipizide.  His blood sugars in the morning are typically around 80.  He reports several attacks throughout the day where he feels extremely lightheaded and weak.  He is not sure if it is his blood pressure dropping or his blood sugar dropping.  He is not checking his heart rate or blood pressure or blood sugar during these events.  He denies any syncope but he does feel lightheaded and weak and needs to sit and rest when these attacks happen.  At that time, my plan was: Start by checking CBC to rule out anemia.  Given his stage IV chronic kidney disease, he certainly runs a risk of having iron deficiency anemia or erythropoietin deficiency anemia.  If his hemoglobin is dropping further this may be contributing some to his dyspnea on exertion.  Recheck a CMP to  monitor his potassium and renal function.  Check an A1c.  Also believe the patient is at high risk for having hypoglycemic episodes on glipizide which may be causing some of his dizziness and weakness.  If his A1c is acceptable, I would temporarily recommend stopping the glipizide and seeing if some of these attacks improve.  Of asked the patient to check his blood pressure, blood sugar, and heart rate every time he has 1 of these attacks.  My concern would be either a drop in his blood pressure, cardiac arrhythmia such as bradycardia or A. fib, or hypoglycemia as a potential cause of these "weak spells".  Patient will call me back in 1 week with an update after holding the glipizide to see how he is feeling.  If he continues to have these attacks I would recommend an ambulatory blood pressure monitor and may be even an event monitor to rule out cardiac arrhythmias.  03/10/20 Hemoglobin was 6.3.  Therefore I asked the patient to hold glipizide due to potential hypoglycemia as the cause of his "attacks".  Patient held the glipizide for 2 days but then resumed it when his blood sugar was in the 140s.  Therefore he never really tried to come off the medication.  His wife is still concerned because occasionally he seems confused and "out of it".  He also has trouble walking occasionally due to dizziness and weakness.  I also found that his hemoglobin had dropped from 11.5-9.5.  He is on Eliquis due to pulmonary embolism.  However his hemoglobin has dropped substantially.  His B12 level and iron levels were low.  Therefore as I explained to the patient he is either anemic because of iron and B12 deficiency, GI bleeding, or deficiency and erythropoietin.  He has been taking iron and B12 over the last month.  He never checked his stool for blood.  He still feels extremely short of breath with activity Past Medical History:  Diagnosis Date  . Anemia associated with chronic renal failure basically  . Asthma    as a  child  . CHF (congestive heart failure) ([place])   . Chronic kidney disease    Sees Dr [name]  . Constipation   . Coronary artery disease   . Diabetes mellitus without complication ([place])   . DVT (deep venous thrombosis) ([place])   . History of blood clots   . Hyperlipidemia   . Hypertension   . Leg pain    ABIs/LE Arterial US 03/2019: ABIs unreliable; +calcification, no evidence of stenosis  . Myocardial infarction ([place])   . Obesity   . Pneumonia 2018  . Sleep apnea   . SOB (shortness of breath)   . Swelling of both lower extremities    Past Surgical History:  Procedure Laterality Date  . COLONOSCOPY    . CORONARY ARTERY BYPASS GRAFT N/A 03/07/2017   Procedure: CORONARY ARTERY BYPASS GRAFTING (CABG), X 3 , USING RIGHT INTERNAL MAMMARY ARTERY, AND RIGHT LEG GREATER SAPHENOUS VEIN HARVESTED ENDOSCOPICALLY;  Surgeon: [name], MD;  Location: [facility];  Service: Open Heart Surgery;  Laterality: N/A;  . HAND SURGERY  1991  . RIGHT/LEFT HEART CATH AND CORONARY ANGIOGRAPHY N/A 12/20/2016   Procedure: RIGHT/LEFT HEART CATH AND CORONARY ANGIOGRAPHY;  Surgeon: [name], MD;  Location: [facility];  Service: Cardiovascular;  Laterality: N/A;  . TEE WITHOUT CARDIOVERSION N/A 03/07/2017   Procedure: TRANSESOPHAGEAL ECHOCARDIOGRAM (TEE);  Surgeon: [name], [name], MD;  Location: [facility];  Service: Open Heart Surgery;  Laterality: N/A;  . TONSILLECTOMY     Current Outpatient Medications on File Prior to Visit  Medication Sig Dispense Refill  . apixaban (ELIQUIS) 5 MG TABS tablet Take 10 mg (2 tablets) twice daily for one week starting 11/4 then take 5 mg (1 tablet) twice daily 60 tablet 11  . carvedilol (COREG) 6.25 MG tablet TAKE 1 TABLET (6.25 MG TOTAL) BY MOUTH 2 (TWO) TIMES DAILY WITH A MEAL. 180 tablet 1  . Evolocumab (REPATHA SURECLICK) 093 MG/ML SOAJ Inject 1 pen into the skin every 14 (fourteen) days. 6 pen 3  . ferrous sulfate (FERROUSUL) 325 (65 FE) MG tablet Take 1 tablet  (325 mg total) by mouth daily with breakfast.  3  . glipiZIDE (GLUCOTROL XL) 5 MG 24 hr tablet Take 1 tablet (5 mg total) by mouth daily with breakfast. 90 tablet 1  . icosapent Ethyl (VASCEPA) 1 g capsule Take 2 capsules (2 g total) by mouth 2 (two) times daily. 360 capsule 3  . torsemide (DEMADEX) 20 MG tablet TAKE 1 TABLET BY MOUTH EVERY DAY 90 tablet 3  . vitamin B-12 (CYANOCOBALAMIN) 100 MCG tablet Take 1 tablet (100 mcg total) by mouth daily.    . cetirizine (ZYRTEC) 10 MG tablet Take 10 mg by mouth at bedtime. (Patient not taking: No sig reported)    . ondansetron (ZOFRAN) 4 MG tablet Take 4 mg  by mouth every 6 (six) hours as needed. (Patient not taking: No sig reported)     No current facility-administered medications on file prior to visit.   Allergies  Allergen Reactions  . Atorvastatin     Leg aches    Social History   Socioeconomic History  . Marital status: Married    Spouse name: Not on file  . Number of children: Not on file  . Years of education: Not on file  . Highest education level: Not on file  Occupational History  . Occupation: Clinical biochemist  Tobacco Use  . Smoking status: Never Smoker  . Smokeless tobacco: Never Used  Vaping Use  . Vaping Use: Never used  Substance and Sexual Activity  . Alcohol use: No  . Drug use: No  . Sexual activity: Never  Other Topics Concern  . Not on file  Social History Narrative  . Not on file   Social Determinants of Health   Financial Resource Strain: Not on file  Food Insecurity: Not on file  Transportation Needs: Not on file  Physical Activity: Not on file  Stress: Not on file  Social Connections: Not on file  Intimate Partner Violence: Not on file   Family History  Problem Relation Age of Onset  . Cancer Other   . Hypertension Other   . Hyperlipidemia Other   . Heart disease Other   . Cancer Mother   . Heart disease Mother   . Diabetes Mother   . Cancer Father   . Heart disease Father   .  Hypertension Father   . Congenital heart disease Father   . Lymphoma Sister       Review of Systems  All other systems reviewed and are negative.      Objective:   Physical Exam Vitals reviewed.  Constitutional:      General: He is not in acute distress.    Appearance: He is obese. He is not ill-appearing, toxic-appearing or diaphoretic.  HENT:     Head: Normocephalic and atraumatic.  Cardiovascular:     Rate and Rhythm: Normal rate.     Heart sounds: Normal heart sounds. No murmur heard. No friction rub. No gallop.   Pulmonary:     Effort: Pulmonary effort is normal. No respiratory distress.     Breath sounds: Normal breath sounds. No stridor. No wheezing, rhonchi or rales.  Abdominal:     General: Bowel sounds are normal. There is no distension.     Palpations: Abdomen is soft.     Tenderness: There is no abdominal tenderness. There is no guarding.  Musculoskeletal:     Right lower leg: Edema present.     Left lower leg: Edema present.  Neurological:     General: No focal deficit present.     Mental Status: He is alert and oriented to person, place, and time.     Cranial Nerves: No cranial nerve deficit.     Sensory: No sensory deficit.     Motor: No weakness.     Coordination: Coordination normal.     Gait: Gait normal.  Psychiatric:        Mood and Affect: Mood normal.        Behavior: Behavior normal.        Thought Content: Thought content normal.        Judgment: Judgment normal.           Assessment & Plan:  Iron deficiency anemia, unspecified iron deficiency anemia type -  Plan: CBC with Differential/Platelet, COMPLETE METABOLIC PANEL WITH GFR, Anemia panel, Vitamin B12, Folate, Iron, TIBC and Ferritin Panel, Reticulocytes, CANCELED: Sedimentation Rate  Type 2 diabetes mellitus with other specified complication, without long-term current use of insulin (HCC)  Other fatigue  I truly believe some of the confusion and dizziness is likely due to low  blood sugar.  I would like him to stop the glipizide for 2 weeks and not resume it unless his blood sugars get above 200.  If he is feeling better in 2 weeks we would replace the glipizide with another alternative.  Given his kidney function, his best option may be something like Trulicity.  However I would like to see how he feels and 2 weeks off the glipizide, effectively ruling out hypoglycemia as a potential cause".  I believe that his fatigue and dyspnea on exertion is made worse by his anemia.  Has been taking the iron and the B12 for the last month.  Recheck a CBC, iron level, and B12 levels today.  If his hemoglobin has not improved, we will need to rule out a GI bleed and I explained this to the patient and his wife.  If there is no GI bleed, the patient may need to consider iron infusions or even erythropoietin and I would consult hematology for this

## 2020-03-11 LAB — CBC WITH DIFFERENTIAL/PLATELET
Absolute Monocytes: 549 cells/uL (ref 200–950)
Basophils Absolute: 18 cells/uL (ref 0–200)
Basophils Relative: 0.3 %
Eosinophils Absolute: 148 cells/uL (ref 15–500)
Eosinophils Relative: 2.5 %
HCT: 27.9 % — ABNORMAL LOW (ref 38.5–50.0)
Hemoglobin: 8.6 g/dL — ABNORMAL LOW (ref 13.2–17.1)
Lymphs Abs: 1074 cells/uL (ref 850–3900)
MCH: 26.8 pg — ABNORMAL LOW (ref 27.0–33.0)
MCHC: 30.8 g/dL — ABNORMAL LOW (ref 32.0–36.0)
MCV: 86.9 fL (ref 80.0–100.0)
MPV: 10.3 fL (ref 7.5–12.5)
Monocytes Relative: 9.3 %
Neutro Abs: 4112 cells/uL (ref 1500–7800)
Neutrophils Relative %: 69.7 %
Platelets: 273 10*3/uL (ref 140–400)
RBC: 3.21 10*6/uL — ABNORMAL LOW (ref 4.20–5.80)
RDW: 15.8 % — ABNORMAL HIGH (ref 11.0–15.0)
Total Lymphocyte: 18.2 %
WBC: 5.9 10*3/uL (ref 3.8–10.8)

## 2020-03-11 LAB — COMPLETE METABOLIC PANEL WITH GFR
AG Ratio: 1.2 (calc) (ref 1.0–2.5)
ALT: 9 U/L (ref 9–46)
AST: 13 U/L (ref 10–35)
Albumin: 2.9 g/dL — ABNORMAL LOW (ref 3.6–5.1)
Alkaline phosphatase (APISO): 48 U/L (ref 35–144)
BUN/Creatinine Ratio: 16 (calc) (ref 6–22)
BUN: 50 mg/dL — ABNORMAL HIGH (ref 7–25)
CO2: 22 mmol/L (ref 20–32)
Calcium: 8 mg/dL — ABNORMAL LOW (ref 8.6–10.3)
Chloride: 109 mmol/L (ref 98–110)
Creat: 3.16 mg/dL — ABNORMAL HIGH (ref 0.70–1.25)
GFR, Est African American: 22 mL/min/{1.73_m2} — ABNORMAL LOW (ref >=60)
GFR, Est Non African American: 19 mL/min/{1.73_m2} — ABNORMAL LOW (ref >=60)
Globulin: 2.5 g/dL (calc) (ref 1.9–3.7)
Glucose, Bld: 127 mg/dL — ABNORMAL HIGH (ref 65–99)
Potassium: 5.5 mmol/L — ABNORMAL HIGH (ref 3.5–5.3)
Sodium: 140 mmol/L (ref 135–146)
Total Bilirubin: 0.2 mg/dL (ref 0.2–1.2)
Total Protein: 5.4 g/dL — ABNORMAL LOW (ref 6.1–8.1)

## 2020-03-11 LAB — IRON,TIBC AND FERRITIN PANEL
%SAT: 67 % (calc) — ABNORMAL HIGH (ref 20–48)
Ferritin: 14 ng/mL — ABNORMAL LOW (ref 24–380)
Iron: 220 ug/dL — ABNORMAL HIGH (ref 50–180)
TIBC: 326 mcg/dL (calc) (ref 250–425)

## 2020-03-11 LAB — RETICULOCYTES
ABS Retic: 63210 cells/uL (ref 25000–9000)
Retic Ct Pct: 2.1 %

## 2020-03-11 LAB — VITAMIN B12: Vitamin B-12: 301 pg/mL (ref 200–1100)

## 2020-03-11 LAB — FOLATE: Folate: 9.8 ng/mL

## 2020-03-13 ENCOUNTER — Encounter (INDEPENDENT_AMBULATORY_CARE_PROVIDER_SITE_OTHER): Payer: Self-pay | Admitting: Ophthalmology

## 2020-03-13 ENCOUNTER — Other Ambulatory Visit: Payer: Self-pay | Admitting: *Deleted

## 2020-03-13 ENCOUNTER — Ambulatory Visit (INDEPENDENT_AMBULATORY_CARE_PROVIDER_SITE_OTHER): Payer: PPO | Admitting: Ophthalmology

## 2020-03-13 ENCOUNTER — Other Ambulatory Visit: Payer: Self-pay | Admitting: Family Medicine

## 2020-03-13 ENCOUNTER — Encounter: Payer: Self-pay | Admitting: Family Medicine

## 2020-03-13 ENCOUNTER — Other Ambulatory Visit: Payer: Self-pay

## 2020-03-13 DIAGNOSIS — E113412 Type 2 diabetes mellitus with severe nonproliferative diabetic retinopathy with macular edema, left eye: Secondary | ICD-10-CM | POA: Diagnosis not present

## 2020-03-13 DIAGNOSIS — H43822 Vitreomacular adhesion, left eye: Secondary | ICD-10-CM

## 2020-03-13 DIAGNOSIS — E113411 Type 2 diabetes mellitus with severe nonproliferative diabetic retinopathy with macular edema, right eye: Secondary | ICD-10-CM | POA: Diagnosis not present

## 2020-03-13 DIAGNOSIS — H35033 Hypertensive retinopathy, bilateral: Secondary | ICD-10-CM | POA: Diagnosis not present

## 2020-03-13 DIAGNOSIS — H43821 Vitreomacular adhesion, right eye: Secondary | ICD-10-CM | POA: Diagnosis not present

## 2020-03-13 DIAGNOSIS — G4733 Obstructive sleep apnea (adult) (pediatric): Secondary | ICD-10-CM | POA: Diagnosis not present

## 2020-03-13 DIAGNOSIS — D62 Acute posthemorrhagic anemia: Secondary | ICD-10-CM

## 2020-03-13 DIAGNOSIS — D509 Iron deficiency anemia, unspecified: Secondary | ICD-10-CM

## 2020-03-13 MED ORDER — BEVACIZUMAB 2.5 MG/0.1ML IZ SOSY
2.5000 mg | PREFILLED_SYRINGE | INTRAVITREAL | Status: AC | PRN
  Administered 2020-03-13: 2.5 mg via INTRAVITREAL

## 2020-03-13 NOTE — Assessment & Plan Note (Signed)
Minor OD no impact on acuity 

## 2020-03-13 NOTE — Assessment & Plan Note (Signed)
Numerous pericapillary posterior pole cotton-wool spots do suggest uncontrolled hypertensive retinopathy superimposed upon severe nonproliferative diabetic retinopathy

## 2020-03-13 NOTE — Progress Notes (Signed)
03/13/2020     CHIEF COMPLAINT Patient presents for Retina Follow Up (4 WK FU, POSS AVASTIN OS//Pt reports stable vision OS. Pt denies any new F/F, pain, or pressure OS. ////Last A1C: 6.3 taken a few weeks ago//Last BS: 121 this AM )   HISTORY OF PRESENT ILLNESS: Travis Blair is a 70 y.o. male who presents to the clinic today for:   HPI    Retina Follow Up    Patient presents with  Diabetic Retinopathy.  In left eye.  This started 4 weeks ago.  Duration of 4 weeks.  Since onset it is stable. Additional comments: 4 WK FU, POSS AVASTIN OS  Pt reports stable vision OS. Pt denies any new F/F, pain, or pressure OS.     Last A1C: 6.3 taken a few weeks ago  Last BS: 121 this AM        Last edited by Nichola Sizer D on 03/13/2020  2:06 PM. (History)      Referring physician: [name], MD [place],  [place] [id number]  HISTORICAL INFORMATION:   Selected notes from the MEDICAL RECORD NUMBER    Lab Results  Component Value Date   HGBA1C 6.3 (H) 01/27/2020     CURRENT MEDICATIONS: No current outpatient medications on file. (Ophthalmic Drugs)   No current facility-administered medications for this visit. (Ophthalmic Drugs)   Current Outpatient Medications (Other)  Medication Sig  . apixaban (ELIQUIS) 5 MG TABS tablet Take 10 mg (2 tablets) twice daily for one week starting 11/4 then take 5 mg (1 tablet) twice daily  . carvedilol (COREG) 6.25 MG tablet TAKE 1 TABLET (6.25 MG TOTAL) BY MOUTH 2 (TWO) TIMES DAILY WITH A MEAL.  . cetirizine (ZYRTEC) 10 MG tablet Take 10 mg by mouth at bedtime. (Patient not taking: No sig reported)  . Evolocumab (REPATHA SURECLICK) 637 MG/ML SOAJ Inject 1 pen into the skin every 14 (fourteen) days.  . ferrous sulfate (FERROUSUL) 325 (65 FE) MG tablet Take 1 tablet (325 mg total) by mouth daily with breakfast.  . glipiZIDE (GLUCOTROL XL) 5 MG 24 hr tablet Take 1 tablet (5 mg total) by mouth daily with breakfast.  .  icosapent Ethyl (VASCEPA) 1 g capsule Take 2 capsules (2 g total) by mouth 2 (two) times daily.  . ondansetron (ZOFRAN) 4 MG tablet Take 4 mg by mouth every 6 (six) hours as needed. (Patient not taking: No sig reported)  . torsemide (DEMADEX) 20 MG tablet TAKE 1 TABLET BY MOUTH EVERY DAY  . vitamin B-12 (CYANOCOBALAMIN) 100 MCG tablet Take 1 tablet (100 mcg total) by mouth daily.   No current facility-administered medications for this visit. (Other)      REVIEW OF SYSTEMS:    ALLERGIES Allergies  Allergen Reactions  . Atorvastatin     Leg aches     PAST MEDICAL HISTORY Past Medical History:  Diagnosis Date  . Anemia associated with chronic renal failure basically  . Asthma    as a child  . CHF (congestive heart failure) ([place])   . Chronic kidney disease    Sees Dr [name]  . Constipation   . Coronary artery disease   . Diabetes mellitus without complication ([place])   . DVT (deep venous thrombosis) ([place])   . History of blood clots   . Hyperlipidemia   . Hypertension   . Leg pain    ABIs/LE Arterial US 03/2019: ABIs unreliable; +calcification, no evidence of stenosis  .  Myocardial infarction ([place])   . Obesity   . Pneumonia 2018  . Sleep apnea   . SOB (shortness of breath)   . Swelling of both lower extremities    Past Surgical History:  Procedure Laterality Date  . COLONOSCOPY    . CORONARY ARTERY BYPASS GRAFT N/A 03/07/2017   Procedure: CORONARY ARTERY BYPASS GRAFTING (CABG), X 3 , USING RIGHT INTERNAL MAMMARY ARTERY, AND RIGHT LEG GREATER SAPHENOUS VEIN HARVESTED ENDOSCOPICALLY;  Surgeon: [name], MD;  Location: [facility];  Service: Open Heart Surgery;  Laterality: N/A;  . HAND SURGERY  1991  . RIGHT/LEFT HEART CATH AND CORONARY ANGIOGRAPHY N/A 12/20/2016   Procedure: RIGHT/LEFT HEART CATH AND CORONARY ANGIOGRAPHY;  Surgeon: [name], MD;  Location: [facility];  Service: Cardiovascular;  Laterality: N/A;  . TEE WITHOUT CARDIOVERSION N/A 03/07/2017    Procedure: TRANSESOPHAGEAL ECHOCARDIOGRAM (TEE);  Surgeon: [name], [name], MD;  Location: [facility];  Service: Open Heart Surgery;  Laterality: N/A;  . TONSILLECTOMY      FAMILY HISTORY Family History  Problem Relation Age of Onset  . Cancer Other   . Hypertension Other   . Hyperlipidemia Other   . Heart disease Other   . Cancer Mother   . Heart disease Mother   . Diabetes Mother   . Cancer Father   . Heart disease Father   . Hypertension Father   . Congenital heart disease Father   . Lymphoma Sister     SOCIAL HISTORY Social History   Tobacco Use  . Smoking status: Never Smoker  . Smokeless tobacco: Never Used  Vaping Use  . Vaping Use: Never used  Substance Use Topics  . Alcohol use: No  . Drug use: No         OPHTHALMIC EXAM:  Base Eye Exam    Visual Acuity (ETDRS)      Right Left   Dist cc 20/20 -1 20/20 -1   Correction: Glasses       Tonometry (Tonopen, 2:10 PM)      Right Left   Pressure 16 15       Pupils      Pupils Dark Light Shape React APD   Right PERRL 3 2 Round Brisk None   Left PERRL 3 2 Round Brisk None       Visual Fields (Counting fingers)      Left Right    Full Full       Extraocular Movement      Right Left    Full Full       Neuro/Psych    Oriented x3: Yes       Dilation    Left eye: 1.0% Mydriacyl, 2.5% Phenylephrine @ 2:10 PM        Slit Lamp and Fundus Exam    External Exam      Right Left   External Normal Normal       Slit Lamp Exam      Right Left   Lids/Lashes Normal Normal   Conjunctiva/Sclera White and quiet White and quiet   Cornea Clear Clear   Anterior Chamber Deep and quiet Deep and quiet   Iris Round and reactive Round and reactive   Lens 2+ Nuclear sclerosis 2+ Nuclear sclerosis   Anterior Vitreous Normal Normal       Fundus Exam      Right Left   Posterior Vitreous  Normal   Disc  Normal   C/D Ratio  0.25   Macula  CSME diffuse with posterior pole cotton-wool spots capillary  dropout" swamp edema", Macular thickening, Microaneurysms, Mild clinically significant macular edema   Vessels  NPDR-Severe, with severe retinal nonperfusion capillary dropout with glassy opacification of the peripheral retina   Periphery  Cotton wool spots, PERI papillary in the posterior pole          IMAGING AND PROCEDURES  Imaging and Procedures for 03/13/20  OCT, Retina - OU - Both Eyes       Right Eye Progression has improved. Findings include vitreomacular adhesion .   Left Eye Progression has improved.   Notes Vastly improved CSME OS still active.  Curiously center involved CSME OD has improved postinjection #1 Avastin       Intravitreal Injection, Pharmacologic Agent - OS - Left Eye       Time Out 03/13/2020. 2:27 PM. Confirmed correct patient, procedure, site, and patient consented.   Anesthesia Topical anesthesia was used. Anesthetic medications included Akten 3.5%.   Procedure Preparation included Ofloxacin , 10% betadine to eyelids, 5% betadine to ocular surface. A 30 gauge needle was used.   Injection:  2.5 mg Bevacizumab (AVASTIN) 2.5mg /0.29mL SOSY   NDC: 78588-502-77, Lot: [id number]   Route: Intravitreal, Site: Left Eye  Post-op Post injection exam found visual acuity of at least counting fingers. The patient tolerated the procedure well. There were no complications. The patient received written and verbal post procedure care education. Post injection medications were not given.                 ASSESSMENT/PLAN:  Sleep apnea Patient remains compliant and uses CPAP nightly  Hypertensive retinopathy of both eyes Numerous pericapillary posterior pole cotton-wool spots do suggest uncontrolled hypertensive retinopathy superimposed upon severe nonproliferative diabetic retinopathy  Severe nonproliferative diabetic retinopathy of right eye, with macular edema, associated with type 2 diabetes mellitus (HCC) Improved post injection Avastin No.  1  Vitreomacular adhesion of right eye Minor OD no impact on acuity  Vitreomacular adhesion of left eye Minor OS with no foveal distortion      ICD-10-CM   1. Severe nonproliferative diabetic retinopathy of left eye, with macular edema, associated with type 2 diabetes mellitus (HCC)  V67.2094 OCT, Retina - OU - Both Eyes    Intravitreal Injection, Pharmacologic Agent - OS - Left Eye    bevacizumab (AVASTIN) SOSY 2.5 mg  2. Obstructive sleep apnea syndrome  G47.33   3. Hypertensive retinopathy of both eyes  H35.033   4. Severe nonproliferative diabetic retinopathy of right eye, with macular edema, associated with type 2 diabetes mellitus ([place])  E11.3411   5. Vitreomacular adhesion of right eye  H43.821   6. Vitreomacular adhesion of left eye  H43.822     1.  Very severe CSME left eye, improved nicely post injection #1 Avastin.  We will repeat injection today to look for further improvement.  To propose hypertensive retinopathy, may be made worse by his concomitant current anemia.  Etiology of this is being evaluated currently.  OS, repeat Avastin today  2.  CSME OD vastly improved, follow-up as scheduled consider repeat injection  3.  Ophthalmic Meds Ordered this visit:  Meds ordered this encounter  Medications  . bevacizumab (AVASTIN) SOSY 2.5 mg       Return in about 1 week (around 03/20/2020) for dilate, OD, AVASTIN OCT.  There are no Patient Instructions on file for this visit.   Explained the diagnoses, plan, and follow up with the patient  and they expressed understanding.  Patient expressed understanding of the importance of proper follow up care.   Clent Demark Sian Rockers M.D. Diseases & Surgery of the Retina and Vitreous Retina & Diabetic [place] 03/13/20     Abbreviations: M myopia (nearsighted); A astigmatism; H hyperopia (farsighted); P presbyopia; Mrx spectacle prescription;  CTL contact lenses; OD right eye; OS left eye; OU both eyes  XT exotropia; ET  esotropia; PEK punctate epithelial keratitis; PEE punctate epithelial erosions; DES dry eye syndrome; MGD meibomian gland dysfunction; ATs artificial tears; PFAT's preservative free artificial tears; Gregg nuclear sclerotic cataract; PSC posterior subcapsular cataract; ERM epi-retinal membrane; PVD posterior vitreous detachment; RD retinal detachment; DM diabetes mellitus; DR diabetic retinopathy; NPDR non-proliferative diabetic retinopathy; PDR proliferative diabetic retinopathy; CSME clinically significant macular edema; DME diabetic macular edema; dbh dot blot hemorrhages; CWS cotton wool spot; POAG primary open angle glaucoma; C/D cup-to-disc ratio; HVF humphrey visual field; GVF goldmann visual field; OCT optical coherence tomography; IOP intraocular pressure; BRVO Branch retinal vein occlusion; CRVO central retinal vein occlusion; CRAO central retinal artery occlusion; BRAO branch retinal artery occlusion; RT retinal tear; SB scleral buckle; PPV pars plana vitrectomy; VH Vitreous hemorrhage; PRP panretinal laser photocoagulation; IVK intravitreal kenalog; VMT vitreomacular traction; MH Macular hole;  NVD neovascularization of the disc; NVE neovascularization elsewhere; AREDS age related eye disease study; ARMD age related macular degeneration; POAG primary open angle glaucoma; EBMD epithelial/anterior basement membrane dystrophy; ACIOL anterior chamber intraocular lens; IOL intraocular lens; PCIOL posterior chamber intraocular lens; Phaco/IOL phacoemulsification with intraocular lens placement; Merrick photorefractive keratectomy; LASIK laser assisted in situ keratomileusis; HTN hypertension; DM diabetes mellitus; COPD chronic obstructive pulmonary disease

## 2020-03-13 NOTE — Assessment & Plan Note (Signed)
Patient remains compliant and uses CPAP nightly

## 2020-03-13 NOTE — Assessment & Plan Note (Signed)
Minor OS with no foveal distortion

## 2020-03-13 NOTE — Assessment & Plan Note (Signed)
Improved post injection Avastin No. 1

## 2020-03-14 ENCOUNTER — Encounter: Payer: Self-pay | Admitting: Nurse Practitioner

## 2020-03-16 ENCOUNTER — Encounter: Payer: Self-pay | Admitting: Family Medicine

## 2020-03-20 ENCOUNTER — Encounter (INDEPENDENT_AMBULATORY_CARE_PROVIDER_SITE_OTHER): Payer: Self-pay | Admitting: Ophthalmology

## 2020-03-20 ENCOUNTER — Ambulatory Visit (INDEPENDENT_AMBULATORY_CARE_PROVIDER_SITE_OTHER): Payer: PPO | Admitting: Ophthalmology

## 2020-03-20 ENCOUNTER — Other Ambulatory Visit: Payer: Self-pay

## 2020-03-20 DIAGNOSIS — G4733 Obstructive sleep apnea (adult) (pediatric): Secondary | ICD-10-CM | POA: Diagnosis not present

## 2020-03-20 DIAGNOSIS — H35033 Hypertensive retinopathy, bilateral: Secondary | ICD-10-CM

## 2020-03-20 DIAGNOSIS — E113412 Type 2 diabetes mellitus with severe nonproliferative diabetic retinopathy with macular edema, left eye: Secondary | ICD-10-CM | POA: Diagnosis not present

## 2020-03-20 DIAGNOSIS — E113411 Type 2 diabetes mellitus with severe nonproliferative diabetic retinopathy with macular edema, right eye: Secondary | ICD-10-CM

## 2020-03-20 MED ORDER — BEVACIZUMAB 2.5 MG/0.1ML IZ SOSY
2.5000 mg | PREFILLED_SYRINGE | INTRAVITREAL | Status: AC | PRN
  Administered 2020-03-20: 2.5 mg via INTRAVITREAL

## 2020-03-20 NOTE — Assessment & Plan Note (Signed)
Much improved CSME, will repeat injection today and examination next right IN 8 weeks

## 2020-03-20 NOTE — Assessment & Plan Note (Addendum)
Good patient very compliant with CPAP use nightly with machine feedback :-) green, thus not likely having hypoxic events eventsn or transient hypertension nightly

## 2020-03-20 NOTE — Assessment & Plan Note (Signed)
Improved region of thickening nasal and superior to the fovea 1 week post recent Avastin

## 2020-03-20 NOTE — Assessment & Plan Note (Signed)
Significant cotton-wool spot Formation evident in the posterior segment each eye

## 2020-03-20 NOTE — Progress Notes (Signed)
03/20/2020     CHIEF COMPLAINT Patient presents for Retina Follow Up (1 Week F/U OD, poss Avastin OD//Pt denies noticeable changes to [place] OU since last visit. Pt denies ocular pain, flashes of light, or floaters OU. //LBS: 124 this AM)   HISTORY OF PRESENT ILLNESS: Travis Blair is a 70 y.o. male who presents to the clinic today for:   HPI    Retina Follow Up    Patient presents with  Diabetic Retinopathy.  In right eye.  This started 1 week ago.  Severity is mild.  Duration of 1 week.  Since onset it is stable. Additional comments: 1 Week F/U OD, poss Avastin OD  Pt denies noticeable changes to [place] OU since last visit. Pt denies ocular pain, flashes of light, or floaters OU.   LBS: 124 this AM       Last edited by Rockie Neighbours, [place] on 03/20/2020  2:18 PM. (History)      Referring physician: [name], MD [id number] [facility] [address] [place],  [place] [address]  HISTORICAL INFORMATION:   Selected notes from the MEDICAL RECORD NUMBER    Lab Results  Component Value Date   HGBA1C 6.3 (H) 01/27/2020     CURRENT MEDICATIONS: No current outpatient medications on file. (Ophthalmic Drugs)   No current facility-administered medications for this visit. (Ophthalmic Drugs)   Current Outpatient Medications (Other)  Medication Sig  . apixaban (ELIQUIS) 5 MG TABS tablet Take 10 mg (2 tablets) twice daily for one week starting 11/4 then take 5 mg (1 tablet) twice daily  . carvedilol (COREG) 6.25 MG tablet TAKE 1 TABLET (6.25 MG TOTAL) BY MOUTH 2 (TWO) TIMES DAILY WITH A MEAL.  . cetirizine (ZYRTEC) 10 MG tablet Take 10 mg by mouth at bedtime. (Patient not taking: No sig reported)  . Evolocumab (REPATHA SURECLICK) 834 MG/ML SOAJ Inject 1 pen into the skin every 14 (fourteen) days.  . ferrous sulfate (FERROUSUL) 325 (65 FE) MG tablet Take 1 tablet (325 mg total) by mouth daily with breakfast.  . glipiZIDE (GLUCOTROL XL) 5 MG 24 hr tablet Take 1 tablet (5 mg total) by mouth daily  with breakfast.  . icosapent Ethyl (VASCEPA) 1 g capsule Take 2 capsules (2 g total) by mouth 2 (two) times daily.  . ondansetron (ZOFRAN) 4 MG tablet Take 4 mg by mouth every 6 (six) hours as needed. (Patient not taking: No sig reported)  . torsemide (DEMADEX) 20 MG tablet TAKE 1 TABLET BY MOUTH EVERY DAY  . vitamin B-12 (CYANOCOBALAMIN) 100 MCG tablet Take 1 tablet (100 mcg total) by mouth daily.   No current facility-administered medications for this visit. (Other)      REVIEW OF SYSTEMS:    ALLERGIES Allergies  Allergen Reactions  . Atorvastatin     Leg aches     PAST MEDICAL HISTORY Past Medical History:  Diagnosis Date  . Anemia associated with chronic renal failure basically  . Asthma    as a child  . CHF (congestive heart failure) ([place])   . Chronic kidney disease    Sees Dr [name]  . Constipation   . Coronary artery disease   . Diabetes mellitus without complication ([place])   . DVT (deep venous thrombosis) ([place])   . History of blood clots   . Hyperlipidemia   . Hypertension   . Leg pain    ABIs/LE Arterial US 03/2019: ABIs unreliable; +calcification, no evidence of stenosis  . Myocardial infarction ([place])   .  Obesity   . Pneumonia 2018  . Sleep apnea   . SOB (shortness of breath)   . Swelling of both lower extremities    Past Surgical History:  Procedure Laterality Date  . COLONOSCOPY    . CORONARY ARTERY BYPASS GRAFT N/A 03/07/2017   Procedure: CORONARY ARTERY BYPASS GRAFTING (CABG), X 3 , USING RIGHT INTERNAL MAMMARY ARTERY, AND RIGHT LEG GREATER SAPHENOUS VEIN HARVESTED ENDOSCOPICALLY;  Surgeon: [name], MD;  Location: [facility];  Service: Open Heart Surgery;  Laterality: N/A;  . HAND SURGERY  1991  . RIGHT/LEFT HEART CATH AND CORONARY ANGIOGRAPHY N/A 12/20/2016   Procedure: RIGHT/LEFT HEART CATH AND CORONARY ANGIOGRAPHY;  Surgeon: [name], MD;  Location: [facility];  Service: Cardiovascular;  Laterality: N/A;  . TEE WITHOUT  CARDIOVERSION N/A 03/07/2017   Procedure: TRANSESOPHAGEAL ECHOCARDIOGRAM (TEE);  Surgeon: [name], [name], MD;  Location: [facility];  Service: Open Heart Surgery;  Laterality: N/A;  . TONSILLECTOMY      FAMILY HISTORY Family History  Problem Relation Age of Onset  . Cancer Other   . Hypertension Other   . Hyperlipidemia Other   . Heart disease Other   . Cancer Mother   . Heart disease Mother   . Diabetes Mother   . Cancer Father   . Heart disease Father   . Hypertension Father   . Congenital heart disease Father   . Lymphoma Sister     SOCIAL HISTORY Social History   Tobacco Use  . Smoking status: Never Smoker  . Smokeless tobacco: Never Used  Vaping Use  . Vaping Use: Never used  Substance Use Topics  . Alcohol use: No  . Drug use: No         OPHTHALMIC EXAM: Base Eye Exam    Visual Acuity (ETDRS)      Right Left   Dist cc 20/20 20/20 -1   Correction: Glasses       Tonometry (Tonopen, 2:18 PM)      Right Left   Pressure 18 19       Pupils      Pupils Dark Light Shape React APD   Right PERRL 5 4 Round Brisk None   Left PERRL 5 4 Round Brisk None       Visual Fields (Counting fingers)      Left Right    Full Full       Extraocular Movement      Right Left    Full Full       Neuro/Psych    Oriented x3: Yes       Dilation    Right eye: 1.0% Mydriacyl, 2.5% Phenylephrine @ 2:21 PM        Slit Lamp and Fundus Exam    External Exam      Right Left   External Normal Normal       Slit Lamp Exam      Right Left   Lids/Lashes Normal Normal   Conjunctiva/Sclera White and quiet White and quiet   Cornea Clear Clear   Anterior Chamber Deep and quiet Deep and quiet   Iris Round and reactive Round and reactive   Lens 2+ Nuclear sclerosis 2+ Nuclear sclerosis   Anterior Vitreous Normal Normal       Fundus Exam      Right Left   Posterior Vitreous Normal    Disc Normal    C/D Ratio 0.25    Macula Macular thickening, Microaneurysms, Mild  clinically significant macular edema    Vessels NPDR-Severe, with severe retinal nonperfusion capillary dropout with glassy opacification of the peripheral retina    Periphery Cotton wool spots           IMAGING AND PROCEDURES  Imaging and Procedures for 03/20/20  OCT, Retina - OU - Both Eyes       Right Eye Central Foveal Thickness: 308. Progression has improved. Findings include vitreomacular adhesion , cystoid macular edema.   Left Eye Central Foveal Thickness: 280. Progression has improved. Findings include vitreomacular adhesion , cystoid macular edema.   Notes Vastly improved CSME OS still active.  Yet slightly improved 1 week post recent Avastin  Curiously center involved CSME OD has improved postinjection with significant improvement in center involved CSME OD, will repeat injection OD today and extend interval follow-up for the right eye       Intravitreal Injection, Pharmacologic Agent - OD - Right Eye       Time Out 03/20/2020. 2:52 PM. Confirmed correct patient, procedure, site, and patient consented.   Anesthesia Topical anesthesia was used. Anesthetic medications included Ak[place] 3.5%.   Procedure Preparation included Tobramycin 0.3%, 10% betadine to eyelids, 5% betadine to ocular surface. A 30 gauge needle was used.   Injection:  2.5 mg Bevacizumab (AVASTIN) 2.5mg /0.76mL SOSY   NDC: 47654-650-35, Lot: [id number]   Route: Intravitreal, Site: Right Eye  Post-op Post injection exam found visual acuity of at least counting fingers. The patient tolerated the procedure well. There were no complications. The patient received written and verbal post procedure care education. Post injection medications were not given.                 ASSESSMENT/PLAN:  Sleep apnea Good patient very compliant with CPAP use nightly with machine feedback :-) green, thus not likely having hypoxic events eventsn or transient hypertension nightly  Severe nonproliferative diabetic  retinopathy of left eye, with macular edema, associated with type 2 diabetes mellitus (HCC) Improved region of thickening nasal and superior to the fovea 1 week post recent Avastin  Hypertensive retinopathy of both eyes Significant cotton-wool spot Formation evident in the posterior segment each eye  Severe nonproliferative diabetic retinopathy of right eye, with macular edema, associated with type 2 diabetes mellitus ([place]) Much improved CSME, will repeat injection today and examination next right IN 8 weeks      ICD-10-CM   1. Severe nonproliferative diabetic retinopathy of right eye, with macular edema, associated with type 2 diabetes mellitus (HCC)  E11.3411 OCT, Retina - OU - Both Eyes    Intravitreal Injection, Pharmacologic Agent - OD - Right Eye    bevacizumab (AVASTIN) SOSY 2.5 mg  2. Obstructive sleep apnea syndrome  G47.33   3. Severe nonproliferative diabetic retinopathy of left eye, with macular edema, associated with type 2 diabetes mellitus ([place])  X51.7001   4. Hypertensive retinopathy of both eyes  H35.033     1.  2.  3.  Ophthalmic Meds Ordered this visit:  Meds ordered this encounter  Medications  . bevacizumab (AVASTIN) SOSY 2.5 mg       Return in about 8 weeks (around 05/15/2020) for dilate, OD, AVASTIN OCT.  There are no Patient Instructions on file for this visit.   Explained the diagnoses, plan, and follow up with the patient and they expressed understanding.  Patient expressed understanding of the importance of proper follow up care.   Clent Demark Bonniejean Piano M.D. Diseases & Surgery of the Retina and Vitreous [facility] 03/20/20     Abbreviations: M myopia (nearsighted); A astigmatism; H hyperopia (farsighted); P presbyopia; Mrx spectacle prescription;  CTL contact lenses; OD right eye; OS left eye; OU both eyes  XT exotropia; ET esotropia; PEK punctate epithelial keratitis; PEE punctate epithelial erosions; DES dry eye syndrome; MGD  meibomian gland dysfunction; ATs artificial tears; PFAT's preservative free artificial tears; [place] nuclear sclerotic cataract; PSC posterior subcapsular cataract; ERM epi-retinal membrane; PVD posterior vitreous detachment; RD retinal detachment; DM diabetes mellitus; DR diabetic retinopathy; NPDR non-proliferative diabetic retinopathy; PDR proliferative diabetic retinopathy; CSME clinically significant macular edema; DME diabetic macular edema; dbh dot blot hemorrhages; CWS cotton wool spot; POAG primary open angle glaucoma; C/D cup-to-disc ratio; HVF humphrey visual field; GVF goldmann visual field; OCT optical coherence tomography; IOP intraocular pressure; BRVO Branch retinal vein occlusion; CRVO central retinal vein occlusion; CRAO central retinal artery occlusion; BRAO branch retinal artery occlusion; RT retinal tear; SB scleral buckle; PPV pars plana vitrectomy; VH Vitreous hemorrhage; PRP panretinal laser photocoagulation; IVK intravitreal kenalog; VMT vitreomacular traction; MH Macular hole;  NVD neovascularization of the disc; NVE neovascularization elsewhere; AREDS age related eye disease study; ARMD age related macular degeneration; POAG primary open angle glaucoma; EBMD epithelial/anterior basement membrane dystrophy; ACIOL anterior chamber intraocular lens; IOL intraocular lens; PCIOL posterior chamber intraocular lens; Phaco/IOL phacoemulsification with intraocular lens placement; [place] photorefractive keratectomy; LASIK laser assisted in situ keratomileusis; HTN hypertension; DM diabetes mellitus; COPD chronic obstructive pulmonary disease

## 2020-03-22 DIAGNOSIS — E1122 Type 2 diabetes mellitus with diabetic chronic kidney disease: Secondary | ICD-10-CM | POA: Diagnosis not present

## 2020-03-22 DIAGNOSIS — E785 Hyperlipidemia, unspecified: Secondary | ICD-10-CM | POA: Diagnosis not present

## 2020-03-22 DIAGNOSIS — N189 Chronic kidney disease, unspecified: Secondary | ICD-10-CM | POA: Diagnosis not present

## 2020-03-22 DIAGNOSIS — I2510 Atherosclerotic heart disease of native coronary artery without angina pectoris: Secondary | ICD-10-CM | POA: Diagnosis not present

## 2020-03-22 DIAGNOSIS — N2581 Secondary hyperparathyroidism of renal origin: Secondary | ICD-10-CM | POA: Diagnosis not present

## 2020-03-22 DIAGNOSIS — N184 Chronic kidney disease, stage 4 (severe): Secondary | ICD-10-CM | POA: Diagnosis not present

## 2020-03-28 ENCOUNTER — Encounter: Payer: Self-pay | Admitting: Cardiovascular Disease

## 2020-03-28 NOTE — Progress Notes (Signed)
Cardiology Office Note:    Date:  03/29/2020   ID:  Travis Blair, DOB January 21, 1951, MRN [id number]  PCP:  [name], MD  Cardiologist:  [name], MD    Referring MD: [name], MD   Problem list 1.  Coronary artery disease-status post coronary artery bypass grafting 2.  Chronic diastolic congestive heart failure 3.  Postoperative atrial fibrillation 4.  Deep vein thrombosis 5.  Chronic kidney disease 6.  Hypertension 7.  Hyperlipidemia 8.  Diabetes mellitus 9.  Obstructive sleep apnea  Chief Complaint  Patient presents with  . Coronary Artery Disease        Travis Blair) is a 70 y.o. male with a hx of CAD, DM Seen with wife Travis Blair   Has had DOE with walking  -  Was placed on Imdur, Metoprolol  , passed out , Has CKD - Lisinopril was stopped.  Worse for the past month. Has a + stress test,   Cath shows severe CAD.   Has seen Dr. [name]  - recommnended nephrology consult.  Has not heard back from nephrologist   Has had swelling of left leg for 8 months .  No angina ( is diabetic)   February 26, 2017: Seen back today for follow-up of his coronary artery disease, diabetes mellitus, hypertension, and chronic renal insufficiency. He was found to have a DVT several months ago.  He was started on Eliquis. Has seen Dr. Ron Agee and is scheduled for CABG next Friday  BP is a bit high initially but was normal by the end of the visit   March 26, 2017:  Travis Blair is seen back today for follow-up of his coronary artery disease.  He had coronary artery bypass grafting in January, 2018.  Coronary artery bypass grafting x3 (left internal mammary artery to left anterior descending, saphenous vein graft to posterior descending, saphenous vein graft to circumflex marginal).  He had postoperative atrial fibrillation and was treated with amiodarone.  He was started back  on Eliquis.  Was on Eliquis for a DVT starting  Has had bilateral leg  edema   May 27, 2017: Travis Blair is seen back today for follow-up visit.  He has a history of coronary artery bypass grafting approximately 3 months ago.  He had postoperative atrial fibrillation and has been on amiodarone.  He is been on Eliquis for his atrial fibrillation and also for his DVT. Has stopped his rosuvastatin due to leg cramps aching   December 16, 2017: Travis Blair is seen today for follow-up visit.  He has a history of CABG.  He had postoperative atrial fibrillation.  We had him on amiodarone and Eliquis for a while. Stopped his rosuvastatin because of the leg cramps.  We started atorvastatin 40 mg a day.  He seems to be tolerating the Atorvastatin well  Dr. Joylene Draft has restarted the Eliquis for his unprovoked DVT.  Still has DOE and fatigue  Getting IV iron . - he is anemic  Is having some vein work in his left leg in Dec.  Also has OSA .   Needs to get a new CPAP  Wt.   Is 318 today     Feb. 9, 2021  Travis Blair is seen today for a follow-up visit.  He has a history of coronary artery disease and coronary artery bypass grafting.  He has a history of postoperative atrial fibrillation.  He was on short-term amiodarone and Eliquis.  He was started on rosuvastatin because  of hyperlipidemia but but this caused leg cramps.  We changed him to atorvastatin 40 mg a day.  He has a history of a unprovoked DVT and has been on Eliquis. BP is mildly elevated.   BP at home look normal / high Wt is 330 lbs. ( up 12 lbs from last year)  Not getting regular exercise.  Active at work  Developed leg aches.  He stopped the atorvastatin Has CKD .  Sees nephrology  He stopped his eliquis on his own. Advised him to restart Eliquis ( or find a suitable alternative - ie warfarin )   Feb. 16, 2022: Travis Blair is seen today for follow up of his CAD and unprovoked pulmonary embolus Has a hx of HLD  He had Covid  In aug. 2021.  Received monoclonal Ab. Developed leg edema in Oct. 2021, Was seen by Robbie Lis, PA  on 12/23/19 Wt on 12/23/19 was 301 lbs Echo shows mild LV systolic dysfunction Has severe pulmonary HTN He has significant CKD. Has been seen by nephrology   Wt today is 303 lbs. ( down 27 lbs from last visit )   Hb is now 8.6.  Has not noticed any GI bleeding - going to see the GI doctor next week.  Still on eliquis  If the decision is made that he needs to stop anticoagulation he will need an IVC filter placed.    Past Medical History:  Diagnosis Date  . Anemia associated with chronic renal failure basically  . Asthma    as a child  . CHF (congestive heart failure) ([place])   . Chronic kidney disease    Sees Dr [name]  . Constipation   . Coronary artery disease   . Diabetes mellitus without complication ([place])   . DVT (deep venous thrombosis) ([place])   . History of blood clots   . Hyperlipidemia   . Hypertension   . Leg pain    ABIs/LE Arterial US 03/2019: ABIs unreliable; +calcification, no evidence of stenosis  . Myocardial infarction ([place])   . Obesity   . Pneumonia 2018  . Sleep apnea   . SOB (shortness of breath)   . Swelling of both lower extremities     Past Surgical History:  Procedure Laterality Date  . COLONOSCOPY    . CORONARY ARTERY BYPASS GRAFT N/A 03/07/2017   Procedure: CORONARY ARTERY BYPASS GRAFTING (CABG), X 3 , USING RIGHT INTERNAL MAMMARY ARTERY, AND RIGHT LEG GREATER SAPHENOUS VEIN HARVESTED ENDOSCOPICALLY;  Surgeon: [name], MD;  Location: [facility];  Service: Open Heart Surgery;  Laterality: N/A;  . HAND SURGERY  1991  . RIGHT/LEFT HEART CATH AND CORONARY ANGIOGRAPHY N/A 12/20/2016   Procedure: RIGHT/LEFT HEART CATH AND CORONARY ANGIOGRAPHY;  Surgeon: [name], MD;  Location: [facility];  Service: Cardiovascular;  Laterality: N/A;  . TEE WITHOUT CARDIOVERSION N/A 03/07/2017   Procedure: TRANSESOPHAGEAL ECHOCARDIOGRAM (TEE);  Surgeon: [name], [name], MD;  Location: [facility];  Service: Open Heart Surgery;  Laterality: N/A;  .  TONSILLECTOMY      Current Medications: Current Meds  Medication Sig  . apixaban (ELIQUIS) 5 MG TABS tablet Take 10 mg (2 tablets) twice daily for one week starting 11/4 then take 5 mg (1 tablet) twice daily  . carvedilol (COREG) 6.25 MG tablet TAKE 1 TABLET (6.25 MG TOTAL) BY MOUTH 2 (TWO) TIMES DAILY WITH A MEAL.  Marland Kitchen Evolocumab (REPATHA SURECLICK) 939 MG/ML SOAJ Inject 1 pen into the skin every 14 (fourteen)  days.  . ferrous sulfate (FERROUSUL) 325 (65 FE) MG tablet Take 1 tablet (325 mg total) by mouth daily with breakfast.  . torsemide (DEMADEX) 20 MG tablet TAKE 1 TABLET BY MOUTH EVERY DAY  . vitamin B-12 (CYANOCOBALAMIN) 100 MCG tablet Take 1 tablet (100 mcg total) by mouth daily.  . [DISCONTINUED] cetirizine (ZYRTEC) 10 MG tablet Take 10 mg by mouth at bedtime.  . [DISCONTINUED] glipiZIDE (GLUCOTROL XL) 5 MG 24 hr tablet Take 1 tablet (5 mg total) by mouth daily with breakfast.  . [DISCONTINUED] icosapent Ethyl (VASCEPA) 1 g capsule Take 2 capsules (2 g total) by mouth 2 (two) times daily.  . [DISCONTINUED] ondansetron (ZOFRAN) 4 MG tablet Take 4 mg by mouth every 6 (six) hours as needed.     Allergies:   Atorvastatin   Social History   Socioeconomic History  . Marital status: Married    Spouse name: Not on file  . Number of children: Not on file  . Years of education: Not on file  . Highest education level: Not on file  Occupational History  . Occupation: Clinical biochemist  Tobacco Use  . Smoking status: Never Smoker  . Smokeless tobacco: Never Used  Vaping Use  . Vaping Use: Never used  Substance and Sexual Activity  . Alcohol use: No  . Drug use: No  . Sexual activity: Never  Other Topics Concern  . Not on file  Social History Narrative  . Not on file   Social Determinants of Health   Financial Resource Strain: Not on file  Food Insecurity: Not on file  Transportation Needs: Not on file  Physical Activity: Not on file  Stress: Not on file  Social  Connections: Not on file     Family History: The patient's family history includes Cancer in his father, mother, and another family member; Congenital heart disease in his father; Diabetes in his mother; Heart disease in his father, mother, and another family member; Hyperlipidemia in an other family member; Hypertension in his father and another family member; Lymphoma in his sister. ROS:   Please see the history of present illness.     All other systems reviewed and are negative.  EKGs/Labs/Other Studies Reviewed:    The following studies were reviewed today: Cath films, previous records.     Recent Labs: 09/02/2019: TSH 3.140 03/10/2020: ALT 9; BUN 50; Creat 3.16; Hemoglobin 8.6; Platelets 273; Potassium 5.5; Sodium 140  Recent Lipid Panel    Component Value Date/Time   CHOL 133 08/25/2019 0809   TRIG 133 08/25/2019 0809   HDL 36 (L) 08/25/2019 0809   CHOLHDL 3.7 08/25/2019 0809   LDLCALC 73 08/25/2019 0809   LDLDIRECT 122 (H) 09/21/2018 0728    Physical Exam: Blood pressure (!) 156/68, pulse 62, height 6\' 1"  (1.854 m), weight (!) 303 lb 9.6 oz (137.7 kg), SpO2 94 %.  GEN:  Well nourished, well developed in no acute distress HEENT: Normal NECK: No JVD; No carotid bruits LYMPHATICS: No lymphadenopathy CARDIAC:  RR, with occasional PVC .  RESPIRATORY:  Clear to auscultation without rales, wheezing or rhonchi  ABDOMEN: Soft, non-tender, non-distended MUSCULOSKELETAL:  2-3 + leg edema  SKIN: Warm and dry NEUROLOGIC:  Alert and oriented x 3    EKG:        ASSESSMENT:    1. PAF (paroxysmal atrial fibrillation) ([place])    PLAN:     1.  Coronary artery disease:    No angina  2.   CKD -  Managed by primary MD and nephrology  He will ask Dr. Jason Nest office to fax over his recent BMP    3.  Paroxysmal atrial fibrillation:     No recurrence Cont eliquis   3.  Hyperlipidemia:  Check lipids today , ALT   4.  DVT:     Has bilat leg edema.  We are limited by his  CKD.   Have given him info on the loung doctor leg rest.  5.   DM -  6. HTN   :   BP is mildly elevated .  Is avoiding processed meats.   He is anemic which might be contributing to his HTN   Medication Adjustments/Labs and Tests Ordered: Current medicines are reviewed at length with the patient today.  Concerns regarding medicines are outlined above.  Orders Placed This Encounter  Procedures  . Lipid panel  . ALT   No orders of the defined types were placed in this encounter.   Signed, [name], MD  03/29/2020 11:26 AM    [place]

## 2020-03-29 ENCOUNTER — Ambulatory Visit: Payer: PPO | Admitting: Cardiovascular Disease

## 2020-03-29 ENCOUNTER — Encounter: Payer: Self-pay | Admitting: Cardiovascular Disease

## 2020-03-29 ENCOUNTER — Other Ambulatory Visit: Payer: Self-pay

## 2020-03-29 VITALS — BP 156/68 | HR 62 | Ht 73.0 in | Wt 303.6 lb

## 2020-03-29 DIAGNOSIS — I2510 Atherosclerotic heart disease of native coronary artery without angina pectoris: Secondary | ICD-10-CM | POA: Diagnosis not present

## 2020-03-29 DIAGNOSIS — I480 Paroxysmal atrial fibrillation: Secondary | ICD-10-CM

## 2020-03-29 DIAGNOSIS — I10 Essential (primary) hypertension: Secondary | ICD-10-CM

## 2020-03-29 LAB — LIPID PANEL
Chol/HDL Ratio: 3.2 ratio (ref 0.0–5.0)
Cholesterol, Total: 142 mg/dL (ref 100–199)
HDL: 44 mg/dL (ref >39)
LDL Chol Calc (NIH): 78 mg/dL (ref 0–99)
Triglycerides: 111 mg/dL (ref 0–149)
VLDL Cholesterol Cal: 20 mg/dL (ref 5–40)

## 2020-03-29 LAB — ALT: ALT: 9 IU/L (ref 0–44)

## 2020-03-29 NOTE — Patient Instructions (Addendum)
Medication Instructions:  Your physician recommends that you continue on your current medications as directed. Please refer to the Current Medication list given to you today.  *If you need a refill on your cardiac medications before your next appointment, please call your pharmacy*   Lab Work: TODAY: Lipids, ALT  If you have labs (blood work) drawn today and your tests are completely normal, you will receive your results only by: Marland Kitchen MyChart Message (if you have MyChart) OR . A paper copy in the mail If you have any lab test that is abnormal or we need to change your treatment, we will call you to review the results.   Testing/Procedures: none   Follow-Up: At [facility], you and your health needs are our priority.  As part of our continuing mission to provide you with exceptional heart care, we have created designated Provider Care Teams.  These Care Teams include your primary Cardiologist (physician) and Advanced Practice Providers (APPs -  Physician Assistants and Nurse Practitioners) who all work together to provide you with the care you need, when you need it.   Your next appointment:   6 month(s)  The format for your next appointment:   In Person  Provider:   You will see one of the following Advanced Practice Providers on your designated Care Team:    Richardson Dopp, PA-C  [place], [place]  Other Instructions   For your  leg edema you  should do  the following 1. Leg elevation - I recommend the Lounge Dr. Leg rest.  See below for details  2. Salt restriction  -  Use potassium chloride instead of regular salt as a salt substitute. 3. Walk regularly 4. Compression hose - [facility] [employer] [facility] 5. Weight loss    Available on [email or web address] Or  Go to [email or web address]

## 2020-03-30 ENCOUNTER — Encounter: Payer: Self-pay | Admitting: *Deleted

## 2020-04-02 NOTE — Progress Notes (Signed)
04/02/2020 Travis Blair [id number] 1950-03-03   CHIEF COMPLAINT: Iron deficiency anemia   HISTORY OF PRESENT ILLNESS: Travis Blair is a 70 year old male with a past medical history of hypertension, coronary artery disease s/p MI 2018 s/p 3 vessel CABG 03/07/2017 with post operative atrial fibrillation, chronic diastolic CHF with LV EF 40%,  LLE DVT 2018 and 12/2019 and PE 12/2019 on Eliquis, hyperlipidemia, DM II, CKD stage IV, chronic anemia, obstructive sleep apnea uses Cpap, Covid 19 infection 09/2019, he received monoclonal antibody.   He was referred to our office by Dr. Dennard Schaumann for further evaluation for anemia. Hg 8.6 (baseline Hg 11.6 on 09/02/2019). He denies having any dysphagia, heartburn or upper abdominal pain. He typically passes a dark brown hard solid stool or balls of stool daily. However, since starting po iron a few months ago he is passing a solid black stools. No rectal bleeding. No lower abdominal pain. He underwent a colonoscopy by Dr. Sharlett Iles 03/16/2003 which was incomplete due to a poor bowel prep, a recall colonoscopy in 3 years was recommended but was not done.   History of significant heart disease, CHF, recurrent LLE DVT placed on Eliquis which he stopped for about one year then had recurrent LLE DVT and PE 12/2019 and Eliquis was restarted. He complains of having dyspnea mostly at night time, he is unable to sleep flat. He is sleeping in a recliner with his head elevated. On exam today, he became abruptly SOB when laying supine on the exam table with the head of the exam table raised at approximately 45 degrees. He was placed upright in the sitting position and his SOB resolved within 5 minutes. He was able to step down from the exam table to the chair without further SOB/dyspnea. He has bilateral 2 to 3 + pitting edema to his LEs. He is losing weight on Torsemide. No chest pain or SOB. I am concerned his SOB is due to his acute anemia and residual effects  from his PE with severely elevated pulmonary HTN per ECHO 12/16/2019. The patient was last seen in office by his cardiologist Dr. Acie Fredrickson on 03/29/2020, at that time the patient did not demonstrate or complain of SOB/dyspnea. I will request a cardiac clearance, consider repeat ECHO prior to scheduling Travis Blair for an EGD and colonoscopy.   He has chronic kidney disease which is progressed to stage IV.  Chronic anemia initially thought to be due to his CKD. Hg 11.6 -> 9.5 -> 8.6. He is followed by nephrologist Dr. [name] at [facility].  He was last seen in the office by Dr. [name] on 03/22/2020.  At that time, the patient was referred to vascular surgery for consideration for placement of an AV fistula for future hemodialysis. However, his Glipizide was discontinued 3 weeks ago and his creatinine level has improved therefore plans for fistula placement are currently on hold. Iron level was 21 and iron saturation 6% 01/2020 then started on Ferrous Sulfate $RemoveBeforeD'325mg'oVDRLHBjREOOLB$  po daily. Repeat iron level 220 on 03/10/2020. He is scheduled for his first Feraheme infusion 04/04/2020.    CBC Latest Ref Rng & Units 03/10/2020 01/27/2020 09/02/2019  WBC 3.8 - 10.8 Thousand/uL 5.9 5.9 6.6  Hemoglobin 13.2 - 17.1 g/dL 8.6(L) 9.5(L) 11.6(L)  Hematocrit 38.5 - 50.0 % 27.9(L) 30.9(L) 37.8  Platelets 140 - 400 Thousand/uL 273 264 231    CMP Latest Ref Rng & Units 03/29/2020 03/10/2020 01/27/2020  Glucose 65 - 99 mg/dL - 127(H)  82  BUN 7 - 25 mg/dL - 50(H) 53(H)  Creatinine 0.70 - 1.25 mg/dL - 3.16(H) 3.14(H)  Sodium 135 - 146 mmol/L - 140 138  Potassium 3.5 - 5.3 mmol/L - 5.5(H) 5.6(H)  Chloride 98 - 110 mmol/L - 109 110  CO2 20 - 32 mmol/L - 22 23  Calcium 8.6 - 10.3 mg/dL - 8.0(L) 8.2(L)  Total Protein 6.1 - 8.1 g/dL - 5.4(L) 5.4(L)  Total Bilirubin 0.2 - 1.2 mg/dL - 0.2 0.3  Alkaline Phos 48 - 121 IU/L - - -  AST 10 - 35 U/L - 13 14  ALT 0 - 44 IU/L $Remov'9 9 10  'uhnNOw$ Iron 21. Iron Saturation 6 % and Ferritin 11 on  01/27/2020.  VQ scan 12/16/2019: 1. Intermediate probability for small pulmonary embolism within the RIGHT lower lobe. 2. Probable atelectasis and or infiltrate at the LEFT lung base.  ECHO 12/16/2019: 1. Left ventricular ejection fraction, by estimation, is 45%. The left ventricle has mildly decreased function. The left ventricle demonstrates global hypokinesis. There is moderate left ventricular hypertrophy. Left ventricular diastolic parameters are consistent with Grade II diastolic dysfunction (pseudonormalization). 2. Right ventricular systolic function is mildly reduced. The right ventricular size is normal. There is severely elevated pulmonary artery systolic pressure. The estimated right ventricular systolic pressure is 76.2 mmHg. 3. Left atrial size was mildly dilated. 4. Right atrial size was mildly dilated. 5. The mitral valve is normal in structure. Mild mitral valve regurgitation. No evidence of mitral stenosis. 6. The aortic valve is tricuspid. Aortic valve regurgitation is not visualized. Mild aortic valve sclerosis is present, with no evidence of aortic valve stenosis. 7. Aortic dilatation noted. There is mild dilatation of the ascending aorta, measuring 41 mm. 8. The inferior vena cava is dilated in size with <50% respiratory variability, suggesting right atrial pressure of 15 mmHg. 9. Technically difficult study with poor acoustic windows. The views of the LV were poor eve  Past Medical History:  Diagnosis Date  . Anemia associated with chronic renal failure basically  . Asthma    as a child  . CHF (congestive heart failure) ([place])   . Chronic kidney disease    Sees Dr [name]  . Constipation   . Coronary artery disease   . Diabetes mellitus without complication ([place])   . DVT (deep venous thrombosis) ([place])   . History of blood clots   . Hyperlipidemia   . Hypertension   . Leg pain    ABIs/LE Arterial US 03/2019: ABIs unreliable; +calcification, no evidence of  stenosis  . Myocardial infarction ([place])   . Obesity   . Pneumonia 2018  . Sleep apnea   . SOB (shortness of breath)   . Swelling of both lower extremities    Past Surgical History:  Procedure Laterality Date  . COLONOSCOPY    . CORONARY ARTERY BYPASS GRAFT N/A 03/07/2017   Procedure: CORONARY ARTERY BYPASS GRAFTING (CABG), X 3 , USING RIGHT INTERNAL MAMMARY ARTERY, AND RIGHT LEG GREATER SAPHENOUS VEIN HARVESTED ENDOSCOPICALLY;  Surgeon: [name], MD;  Location: [facility];  Service: Open Heart Surgery;  Laterality: N/A;  . HAND SURGERY  1991  . RIGHT/LEFT HEART CATH AND CORONARY ANGIOGRAPHY N/A 12/20/2016   Procedure: RIGHT/LEFT HEART CATH AND CORONARY ANGIOGRAPHY;  Surgeon: [name], MD;  Location: [facility];  Service: Cardiovascular;  Laterality: N/A;  . TEE WITHOUT CARDIOVERSION N/A 03/07/2017   Procedure: TRANSESOPHAGEAL ECHOCARDIOGRAM (TEE);  Surgeon: [name], [name], MD;  Location: [facility]  OR;  Service: Open Heart Surgery;  Laterality: N/A;  . TONSILLECTOMY     Social History: He is a non-smoker.  No alcohol use.  No drug use.  Family History: Father deceased age 41 with history of heart disease, CABG. Mother deceased age 78 secondary to multiple myeloma. Sister deceased early 47's with history of lymphoma. No family history of esophageal, gastric or colon cancer.    Allergies  Allergen Reactions  . Atorvastatin     Leg aches       Outpatient Encounter Medications as of 04/03/2020  Medication Sig  . apixaban (ELIQUIS) 5 MG TABS tablet Take 10 mg (2 tablets) twice daily for one week starting 11/4 then take 5 mg (1 tablet) twice daily  . carvedilol (COREG) 6.25 MG tablet TAKE 1 TABLET (6.25 MG TOTAL) BY MOUTH 2 (TWO) TIMES DAILY WITH A MEAL.  Marland Kitchen Evolocumab (REPATHA SURECLICK) 350 MG/ML SOAJ Inject 1 pen into the skin every 14 (fourteen) days.  . ferrous sulfate (FERROUSUL) 325 (65 FE) MG tablet Take 1 tablet (325 mg total) by mouth daily with breakfast.  .  torsemide (DEMADEX) 20 MG tablet TAKE 1 TABLET BY MOUTH EVERY DAY  . vitamin B-12 (CYANOCOBALAMIN) 100 MCG tablet Take 1 tablet (100 mcg total) by mouth daily.   No facility-administered encounter medications on file as of 04/03/2020.     REVIEW OF SYSTEMS:  Gen: Denies fever, sweats or chills.  CV: See HPI. Resp: + Cough. SOB/Dyspnea.   GI: Denies heartburn, dysphagia, stomach or lower abdominal pain. No diarrhea or constipation. See HPI. GU : Denies urinary burning, blood in urine, increased urinary frequency or incontinence. MS: Denies joint pain, muscles aches or weakness. Derm: Denies rash, itchiness, skin lesions or unhealing ulcers. Psych: Denies depression, anxiety or memory loss. Heme: Denies bruising, bleeding. Neuro:  Denies headaches, dizziness or paresthesias. Endo:  Denies any problems with DM, thyroid or adrenal function.  PHYSICAL EXAM: BP (!) 164/80 (BP Location: Left Arm, Patient Position: Sitting, Cuff Size: Normal)   Pulse 60   Ht 5' 11.5" (1.816 m) Comment: height measured without shoes  Wt (!) 306 lb (138.8 kg)   BMI 42.08 kg/m  Wt Readings from Last 3 Encounters:  04/03/20 (!) 306 lb (138.8 kg)  03/29/20 (!) 303 lb 9.6 oz (137.7 kg)  03/10/20 (!) 309 lb (140.2 kg)   General: 70 year old male in no acute distress. Head: Normocephalic and atraumatic. Eyes:  Sclerae non-icteric, conjunctive pink. Ears: Normal auditory acuity. Mouth: Dentition intact. No ulcers or lesions.  Neck: Supple, no lymphadenopathy or thyromegaly.  Lungs: Clear bilaterally to auscultation without wheezes, crackles or rhonchi. Heart: Distant S1, S2. Regular rate and rhythm. No murmur, rub or gallop appreciated.  Abdomen: Soft,protuberant.  Nontender, non distended. No masses. No hepatosplenomegaly. Normoactive bowel sounds x 4 quadrants.  Rectal:  Musculoskeletal: Symmetrical with no gross deformities. Skin: Warm and dry. No rash or lesions on visible extremities. Extremities:  Bilateral LEs with 3+ pitting edema.  Neurological: Alert oriented x 4, no focal deficits.  Psychological:  Alert and cooperative. Normal mood and affect.  ASSESSMENT AND PLAN:  8. 70 year old male with acute on chronic iron deficiency anemia. Hg 8.6 (baseline Hg 11.6 on 09/02/2019). Chronic anemia secondary to CKD. Rule out GI source of IDA. No overt GI bleeding. Patient is on Ferrous Sulfate $RemoveBeforeD'325mg'QEGBhbDhZExbvv$  po QD. First Feraheme infusion scheduled 04/04/2020. On exam today, he became abruptly SOB when laying supine on the exam table with the head  of the exam table raised at approximately 45 degrees. He was placed upright in the sitting position and his SOB resolved within 5 minutes. He has bilateral 2 to 3 + pitting edema to his LEs on Torsemide $RemoveBefo'20mg'tNrBfKBmGfe$  QD. No chest pain or SOB. I am concerned his SOB is due to his acute anemia and residual effects from his PE with severely elevated pulmonary HTN per ECHO 12/16/2019.  -EGD and colonoscopy benefits and risks discussed including risk with sedation, risk of bleeding, perforation and infection  -Cardiac clearance with Dr. Acie Fredrickson to include Eliquis instructions requested prior to scheduling EGD and colonoscopy.  -CBC now, if Hg level significantly lower patient will required expedited EGD/colonoscopy and possible hospital admission -The above plan was discussed with Dr. Ardis Hughs   2. History of coronary artery disease s/p 3 vessel CABG 02/2017. Diastolic CHF.   3. History of LE DVT/PE on Eliquis  4. History of post operative atrial fibrillation  5. DM II  6. CKD stage IV followed by Dr[facility] [name]   7. Colon cancer screening. Incomplete colonoscopy due to a poor prep in 2005. Repeat colonoscopy recommended but was not done.  - See plan in # 1  CC:  Pickard, Cammie Mcgee, MD

## 2020-04-03 ENCOUNTER — Other Ambulatory Visit (INDEPENDENT_AMBULATORY_CARE_PROVIDER_SITE_OTHER): Payer: PPO

## 2020-04-03 ENCOUNTER — Ambulatory Visit: Payer: PPO | Admitting: Nurse Practitioner

## 2020-04-03 ENCOUNTER — Telehealth: Payer: Self-pay

## 2020-04-03 ENCOUNTER — Encounter: Payer: Self-pay | Admitting: Nurse Practitioner

## 2020-04-03 ENCOUNTER — Other Ambulatory Visit: Payer: Self-pay

## 2020-04-03 ENCOUNTER — Other Ambulatory Visit (HOSPITAL_COMMUNITY): Payer: Self-pay

## 2020-04-03 VITALS — BP 164/80 | HR 60 | Ht 71.5 in | Wt 306.0 lb

## 2020-04-03 DIAGNOSIS — Z86711 Personal history of pulmonary embolism: Secondary | ICD-10-CM | POA: Diagnosis not present

## 2020-04-03 DIAGNOSIS — D509 Iron deficiency anemia, unspecified: Secondary | ICD-10-CM

## 2020-04-03 DIAGNOSIS — R0600 Dyspnea, unspecified: Secondary | ICD-10-CM

## 2020-04-03 DIAGNOSIS — I2510 Atherosclerotic heart disease of native coronary artery without angina pectoris: Secondary | ICD-10-CM

## 2020-04-03 LAB — CBC
HCT: 26.5 % — ABNORMAL LOW (ref 39.0–52.0)
Hemoglobin: 8.6 g/dL — ABNORMAL LOW (ref 13.0–17.0)
MCHC: 32.4 g/dL (ref 30.0–36.0)
MCV: 82.1 fl (ref 78.0–100.0)
Platelets: 250 10*3/uL (ref 150.0–400.0)
RBC: 3.23 Mil/uL — ABNORMAL LOW (ref 4.22–5.81)
RDW: 17 % — ABNORMAL HIGH (ref 11.5–15.5)
WBC: 7.1 10*3/uL (ref 4.0–10.5)

## 2020-04-03 NOTE — Progress Notes (Signed)
I agree with the above note, plan.  He is at very high risk for endoscopic testing right now.  Cannot lay flat without being short of breath, has 2-3+ pitting edema in his lower extremities.  He really needs some tuning up before endoscopic testing could be safe.

## 2020-04-03 NOTE — Patient Instructions (Signed)
If you are age 69 or older, your body mass index should be between 23-30. Your Body mass index is 42.08 kg/m. If this is out of the aforementioned range listed, please consider follow up with your Primary Care Provider.  If you are age 61 or younger, your body mass index should be between 19-25. Your Body mass index is 42.08 kg/m. If this is out of the aformentioned range listed, please consider follow up with your Primary Care Provider.   LABS:  Lab work has been ordered for you today. Our lab is located in the basement. Press "B" on the elevator. The lab is located at the first door on the left as you exit the elevator.  HEALTHCARE LAWS AND MY CHART RESULTS: Due to recent changes in healthcare laws, you may see the results of your imaging and laboratory studies on MyChart before your provider has had a chance to review them.   We understand that in some cases there may be results that are confusing or concerning to you. Not all laboratory results come back in the same time frame and the provider may be waiting for multiple results in order to interpret others.  Please give [place] 48 hours in order for your provider to thoroughly review all the results before contacting the office for clarification of your results.   Please contact your Cardiologist for clearance prior to scheduling the colonoscopy and endoscopy due to dyspnea when laying down and lower extremity edema. We will also need instruction on holding your Eliquis.   It was great seeing you today! Thank you for entrusting me with your care and choosing [facility].  Noralyn Pick, CRNP

## 2020-04-03 NOTE — Telephone Encounter (Signed)
Request for surgical clearance:   Endoscopy Procedure  What type of surgery is being performed?     Colonoscopy and Endoscopy  When is this surgery scheduled?     TBD  What type of clearance is required ?  Cardiac. Patient was seen today for consult, noted to have dyspnea upon lying on the exam table with LE edema. Patient needs Cardiac clearance along with instructions to hold his Eliquis. Patient will be scheduled for his procedures once he is cleared from Cardiology. Patient has been advised to call his Cardiologist as well.  Are there any medications that need to be held prior to surgery and how long? Eliquis  Practice name and name of physician performing surgery?      [facility]  What is your office phone and fax number?      Phone- [phone]  [phone]  Anesthesia type (None, local, MAC, general) ?       MAC

## 2020-04-04 ENCOUNTER — Other Ambulatory Visit: Payer: Self-pay | Admitting: *Deleted

## 2020-04-04 ENCOUNTER — Other Ambulatory Visit: Payer: Self-pay

## 2020-04-04 ENCOUNTER — Ambulatory Visit (HOSPITAL_COMMUNITY)
Admission: RE | Admit: 2020-04-04 | Discharge: 2020-04-04 | Disposition: A | Discharge status: Home / Self Care | Payer: PPO | Source: Ambulatory Visit | Attending: Nephrology | Admitting: Nephrology

## 2020-04-04 VITALS — BP 148/72 | HR 63 | Temp 97.4°F | Resp 18

## 2020-04-04 DIAGNOSIS — N186 End stage renal disease: Secondary | ICD-10-CM

## 2020-04-04 DIAGNOSIS — N179 Acute kidney failure, unspecified: Secondary | ICD-10-CM | POA: Insufficient documentation

## 2020-04-04 LAB — POCT HEMOGLOBIN-HEMACUE: Hemoglobin: 8.6 g/dL — ABNORMAL LOW (ref 13.0–17.0)

## 2020-04-04 MED ORDER — SODIUM CHLORIDE 0.9 % IV SOLN
510.0000 mg | INTRAVENOUS | Status: DC
  Administered 2020-04-04: 510 mg via INTRAVENOUS
  Filled 2020-04-04: qty 510

## 2020-04-04 MED ORDER — EPOETIN ALFA-EPBX 10000 UNIT/ML IJ SOLN
10000.0000 [IU] | INTRAMUSCULAR | Status: DC
  Administered 2020-04-04: 10000 [IU] via SUBCUTANEOUS

## 2020-04-04 MED ORDER — EPOETIN ALFA-EPBX 10000 UNIT/ML IJ SOLN
INTRAMUSCULAR | Status: AC
  Filled 2020-04-04: qty 1

## 2020-04-04 NOTE — Discharge Instructions (Signed)

## 2020-04-05 ENCOUNTER — Ambulatory Visit (INDEPENDENT_AMBULATORY_CARE_PROVIDER_SITE_OTHER)
Admission: RE | Admit: 2020-04-05 | Discharge: 2020-04-05 | Disposition: A | Discharge status: Home / Self Care | Payer: PPO | Source: Ambulatory Visit | Attending: Nephrology | Admitting: Nephrology

## 2020-04-05 ENCOUNTER — Other Ambulatory Visit: Payer: Self-pay

## 2020-04-05 ENCOUNTER — Ambulatory Visit (HOSPITAL_COMMUNITY)
Admission: RE | Admit: 2020-04-05 | Discharge: 2020-04-05 | Disposition: A | Discharge status: Home / Self Care | Payer: PPO | Source: Ambulatory Visit | Attending: Nephrology | Admitting: Nephrology

## 2020-04-05 DIAGNOSIS — N186 End stage renal disease: Secondary | ICD-10-CM | POA: Insufficient documentation

## 2020-04-05 NOTE — Telephone Encounter (Signed)
Travis Blair is having severe dyspnea when lying flat which will prevent him from being able to safely have his endoscopy and colonoscopy. Please have in increase his torsemide to 20 mg BID for a week and then have him return for nurse visit and bmp.   I'm hoping that a week of double dose torsemide will help with this orthopnea without causing any increase in his creatinine. If this works, we will do this for his GI procedure prep so that he can have the procedure safely .   Thanks [employer]

## 2020-04-11 ENCOUNTER — Encounter (HOSPITAL_COMMUNITY)
Admission: RE | Admit: 2020-04-11 | Discharge: 2020-04-11 | Disposition: A | Discharge status: Home / Self Care | Payer: PPO | Source: Ambulatory Visit | Attending: Nephrology | Admitting: Nephrology

## 2020-04-11 ENCOUNTER — Other Ambulatory Visit: Payer: Self-pay

## 2020-04-11 VITALS — BP 154/77 | HR 63 | Temp 97.3°F | Resp 18

## 2020-04-11 DIAGNOSIS — D631 Anemia in chronic kidney disease: Secondary | ICD-10-CM | POA: Insufficient documentation

## 2020-04-11 DIAGNOSIS — N184 Chronic kidney disease, stage 4 (severe): Secondary | ICD-10-CM | POA: Insufficient documentation

## 2020-04-11 DIAGNOSIS — N179 Acute kidney failure, unspecified: Secondary | ICD-10-CM

## 2020-04-11 MED ORDER — EPOETIN ALFA-EPBX 10000 UNIT/ML IJ SOLN
INTRAMUSCULAR | Status: AC
  Filled 2020-04-11: qty 1

## 2020-04-11 MED ORDER — EPOETIN ALFA-EPBX 10000 UNIT/ML IJ SOLN
10000.0000 [IU] | INTRAMUSCULAR | Status: DC
  Administered 2020-04-11: 10000 [IU] via SUBCUTANEOUS

## 2020-04-11 MED ORDER — SODIUM CHLORIDE 0.9 % IV SOLN
510.0000 mg | INTRAVENOUS | Status: AC
  Administered 2020-04-11: 510 mg via INTRAVENOUS
  Filled 2020-04-11: qty 510

## 2020-04-12 ENCOUNTER — Other Ambulatory Visit: Payer: Self-pay | Admitting: Cardiovascular Disease

## 2020-04-12 LAB — POCT HEMOGLOBIN-HEMACUE: Hemoglobin: 8.2 g/dL — ABNORMAL LOW (ref 13.0–17.0)

## 2020-04-12 NOTE — Telephone Encounter (Signed)
Eliquis 5mg  refill request received. Patient is 70 years old, weight-138.8kg, Crea-3.16 on 03/10/2020, Diagnosis-Afib, and last seen by Dr. Acie Fredrickson on 03/29/20. Dose is appropriate based on dosing criteria. Will send in refill to requested pharmacy.

## 2020-04-14 ENCOUNTER — Telehealth: Payer: Self-pay | Admitting: Family Medicine

## 2020-04-14 NOTE — Telephone Encounter (Signed)
Form received, put in the green folder in doctor d office.

## 2020-04-14 NOTE — Telephone Encounter (Signed)
Pt dropped off form for Handicap placard. Form placed in nurse's folder. Please call when ready for pick up  Cb#: [phone]

## 2020-04-16 ENCOUNTER — Encounter: Payer: Self-pay | Admitting: Family Medicine

## 2020-04-17 ENCOUNTER — Encounter: Payer: Self-pay | Admitting: Vascular Surgery

## 2020-04-17 ENCOUNTER — Other Ambulatory Visit: Payer: Self-pay

## 2020-04-17 ENCOUNTER — Ambulatory Visit: Payer: PPO | Admitting: Vascular Surgery

## 2020-04-17 VITALS — BP 195/97 | HR 76 | Temp 97.9°F | Resp 20 | Ht 71.5 in | Wt 313.0 lb

## 2020-04-17 DIAGNOSIS — N184 Chronic kidney disease, stage 4 (severe): Secondary | ICD-10-CM

## 2020-04-17 NOTE — H&P (View-Only) (Signed)
Patient ID: Travis Blair, male   DOB: 04-23-1950, 70 y.o.   MRN: [id number]  Reason for Consult: New Patient (Initial Visit)   Referred by [name], MD  Subjective:     HPI:  Travis Blair is a 70 y.o. male here for evaluation of dialysis access.  He does have CKD.  More recently his chief complaints are anemia for which she has been getting iron infusions.  He did have a pulmonary embolus in November for which she has been placed on Eliquis.  He has never been on dialysis in the past does not have any family history of dialysis.  He is right-hand dominant.  Denies any previous history of left upper extremity surgery has not had breast surgery did have coronary artery bypass graft surgery in the past.  Past Medical History:  Diagnosis Date  . Anemia associated with chronic renal failure basically  . Asthma    as a child  . CHF (congestive heart failure) ([place])   . Chronic kidney disease    Sees Dr [name]  . Constipation   . Coronary artery disease   . Diabetes mellitus without complication ([place])   . DVT (deep venous thrombosis) ([place])   . History of blood clots   . Hyperlipidemia   . Hypertension   . Leg pain    ABIs/LE Arterial US 03/2019: ABIs unreliable; +calcification, no evidence of stenosis  . Myocardial infarction ([place])   . Obesity   . Pneumonia 2018  . Pulmonary embolism ([place])   . Sleep apnea    CPAP  . SOB (shortness of breath)   . Swelling of both lower extremities    Family History  Problem Relation Age of Onset  . Heart disease Mother   . Diabetes Mother   . Multiple myeloma Mother   . Cancer Father        type unknown, as a child  . Heart disease Father   . Hypertension Father   . Congenital heart disease Father   . Hyperlipidemia Father   . Lymphoma Sister    Past Surgical History:  Procedure Laterality Date  . COLONOSCOPY    . CORONARY ARTERY BYPASS GRAFT N/A 03/07/2017   Procedure: CORONARY ARTERY BYPASS GRAFTING (CABG), X 3 ,  USING RIGHT INTERNAL MAMMARY ARTERY, AND RIGHT LEG GREATER SAPHENOUS VEIN HARVESTED ENDOSCOPICALLY;  Surgeon: [name], MD;  Location: [facility];  Service: Open Heart Surgery;  Laterality: N/A;  . HAND SURGERY Right 1991  . RIGHT/LEFT HEART CATH AND CORONARY ANGIOGRAPHY N/A 12/20/2016   Procedure: RIGHT/LEFT HEART CATH AND CORONARY ANGIOGRAPHY;  Surgeon: [name], MD;  Location: [facility];  Service: Cardiovascular;  Laterality: N/A;  . TEE WITHOUT CARDIOVERSION N/A 03/07/2017   Procedure: TRANSESOPHAGEAL ECHOCARDIOGRAM (TEE);  Surgeon: [name], [name], MD;  Location: [facility];  Service: Open Heart Surgery;  Laterality: N/A;  . TONSILLECTOMY      Short Social History:  Social History   Tobacco Use  . Smoking status: Never Smoker  . Smokeless tobacco: Never Used  Substance Use Topics  . Alcohol use: No    Allergies  Allergen Reactions  . Lipitor [Atorvastatin]     Leg aches     Current Outpatient Medications  Medication Sig Dispense Refill  . carvedilol (COREG) 6.25 MG tablet TAKE 1 TABLET (6.25 MG TOTAL) BY MOUTH 2 (TWO) TIMES DAILY WITH A MEAL. 180 tablet 1  . ELIQUIS 5 MG TABS tablet TAKE 1 TABLET  BY MOUTH TWICE A DAY 60 tablet 5  . Evolocumab (REPATHA SURECLICK) 161 MG/ML SOAJ Inject 1 pen into the skin every 14 (fourteen) days. 6 pen 3  . ferrous sulfate (FERROUSUL) 325 (65 FE) MG tablet Take 1 tablet (325 mg total) by mouth daily with breakfast.  3  . magnesium oxide (MAG-OX) 400 MG tablet Take 400 mg by mouth daily.    . Multiple Vitamins-Minerals (CENTRUM SILVER 50+MEN PO) Take 1 tablet by mouth daily.    Marland Kitchen torsemide (DEMADEX) 20 MG tablet TAKE 1 TABLET BY MOUTH EVERY DAY 90 tablet 3   No current facility-administered medications for this visit.    Review of Systems  Constitutional: Positive for fatigue.  HENT: HENT negative.  Eyes: Eyes negative.  Respiratory: Respiratory negative.  Cardiovascular: Cardiovascular negative.  GI: Gastrointestinal  negative.  Musculoskeletal: Musculoskeletal negative.  Neurological: Neurological negative. Hematologic: Hematologic/lymphatic negative.  Psychiatric: Psychiatric negative.        Objective:  Objective   There were no vitals filed for this visit. There is no height or weight on file to calculate BMI.  Physical Exam HENT:     Head: Normocephalic.     Nose:     Comments: Wearing a mask Eyes:     Pupils: Pupils are equal, round, and reactive to light.  Cardiovascular:     Pulses:          Radial pulses are 0 on the right side and 2+ on the left side.  Pulmonary:     Effort: Pulmonary effort is normal.  Abdominal:     General: Abdomen is flat.  Musculoskeletal:        General: No swelling. Normal range of motion.  Skin:    General: Skin is warm.  Neurological:     General: No focal deficit present.     Mental Status: He is alert.  Psychiatric:        Mood and Affect: Mood normal.        Behavior: Behavior normal.        Thought Content: Thought content normal.        Judgment: Judgment normal.     Data: +-----------------+-------------+----------+---------+  Right Cephalic  Diameter (cm)Depth (cm)Findings   +-----------------+-------------+----------+---------+  Shoulder       0.47     0.91         +-----------------+-------------+----------+---------+  Prox upper arm    0.41     0.42         +-----------------+-------------+----------+---------+  Mid upper arm    0.44     0.48  branching  +-----------------+-------------+----------+---------+  Dist upper arm    0.41     0.52         +-----------------+-------------+----------+---------+  Antecubital fossa  0.53     0.30         +-----------------+-------------+----------+---------+  Prox forearm     0.37     0.52  branching  +-----------------+-------------+----------+---------+  Mid forearm     0.40      0.36         +-----------------+-------------+----------+---------+  Dist forearm     0.24     0.32         +-----------------+-------------+----------+---------+  Wrist        0.21     0.33         +-----------------+-------------+----------+---------+   +-----------------+-------------+----------+---------+  Right Basilic  Diameter (cm)Depth (cm)Findings   +-----------------+-------------+----------+---------+  Prox upper arm    0.38     1.71         +-----------------+-------------+----------+---------+  Mid upper arm    0.30     0.88         +-----------------+-------------+----------+---------+  Dist upper arm    0.27     0.72         +-----------------+-------------+----------+---------+  Antecubital fossa  0.21     0.35         +-----------------+-------------+----------+---------+  Prox forearm     0.13     0.38  branching  +-----------------+-------------+----------+---------+  Mid forearm     0.15     0.30         +-----------------+-------------+----------+---------+  Distal forearm    0.11     0.33         +-----------------+-------------+----------+---------+  Wrist        0.11     0.29         +-----------------+-------------+----------+---------+   +-----------------+-------------+----------+---------+  Left Cephalic  Diameter (cm)Depth (cm)Findings   +-----------------+-------------+----------+---------+  Shoulder       0.41     0.50         +-----------------+-------------+----------+---------+  Prox upper arm    0.42     0.59         +-----------------+-------------+----------+---------+  Mid upper arm    0.42     0.58         +-----------------+-------------+----------+---------+  Dist upper arm    0.43     0.77          +-----------------+-------------+----------+---------+  Antecubital fossa  0.69     0.54         +-----------------+-------------+----------+---------+  Prox forearm     0.36     0.84         +-----------------+-------------+----------+---------+  Mid forearm     0.32     0.66         +-----------------+-------------+----------+---------+  Dist forearm     0.25     0.33  branching  +-----------------+-------------+----------+---------+  Wrist        0.24     0.30         +-----------------+-------------+----------+---------+   +-----------------+-------------+----------+---------+  Left Basilic   Diameter (cm)Depth (cm)Findings   +-----------------+-------------+----------+---------+  Prox upper arm    0.42     2.30         +-----------------+-------------+----------+---------+  Mid upper arm    0.29     2.26  branching  +-----------------+-------------+----------+---------+  Dist upper arm    0.23     1.53         +-----------------+-------------+----------+---------+  Antecubital fossa  0.22     0.58         +-----------------+-------------+----------+---------+  Prox forearm     0.18     0.37         +-----------------+-------------+----------+---------+  Mid forearm     0.12     0.42         +-----------------+-------------+----------+---------+  Distal forearm    0.11     0.43         +-----------------+-------------+----------+---------+         Assessment/Plan:     70 year old male here for evaluation of permanent dialysis access.  He is right-hand dominant does appear to have suitable vein on the left as well as an easily palpable left radial artery.  We will plan for left upper extremity fistula in the near future.  We will need to hold Eliquis for 48 hours  prior.  I discussed the risk benefits alternatives as well as the possibility of primary nonfunction and need for  further procedures in the future with the patient he demonstrates good understanding.     Waynetta Sandy MD [facility]

## 2020-04-17 NOTE — Progress Notes (Signed)
Patient ID: Travis Blair, male   DOB: Nov 30, 1950, 70 y.o.   MRN: [id number]  Reason for Consult: New Patient (Initial Visit)   Referred by [name], MD  Subjective:     HPI:  Travis Blair is a 70 y.o. male here for evaluation of dialysis access.  He does have CKD.  More recently his chief complaints are anemia for which she has been getting iron infusions.  He did have a pulmonary embolus in November for which she has been placed on Eliquis.  He has never been on dialysis in the past does not have any family history of dialysis.  He is right-hand dominant.  Denies any previous history of left upper extremity surgery has not had breast surgery did have coronary artery bypass graft surgery in the past.  Past Medical History:  Diagnosis Date  . Anemia associated with chronic renal failure basically  . Asthma    as a child  . CHF (congestive heart failure) ([place])   . Chronic kidney disease    Sees Dr [name]  . Constipation   . Coronary artery disease   . Diabetes mellitus without complication ([place])   . DVT (deep venous thrombosis) ([place])   . History of blood clots   . Hyperlipidemia   . Hypertension   . Leg pain    ABIs/LE Arterial US 03/2019: ABIs unreliable; +calcification, no evidence of stenosis  . Myocardial infarction ([place])   . Obesity   . Pneumonia 2018  . Pulmonary embolism ([place])   . Sleep apnea    CPAP  . SOB (shortness of breath)   . Swelling of both lower extremities    Family History  Problem Relation Age of Onset  . Heart disease Mother   . Diabetes Mother   . Multiple myeloma Mother   . Cancer Father        type unknown, as a child  . Heart disease Father   . Hypertension Father   . Congenital heart disease Father   . Hyperlipidemia Father   . Lymphoma Sister    Past Surgical History:  Procedure Laterality Date  . COLONOSCOPY    . CORONARY ARTERY BYPASS GRAFT N/A 03/07/2017   Procedure: CORONARY ARTERY BYPASS GRAFTING (CABG), X 3 ,  USING RIGHT INTERNAL MAMMARY ARTERY, AND RIGHT LEG GREATER SAPHENOUS VEIN HARVESTED ENDOSCOPICALLY;  Surgeon: [name], MD;  Location: [facility];  Service: Open Heart Surgery;  Laterality: N/A;  . HAND SURGERY Right 1991  . RIGHT/LEFT HEART CATH AND CORONARY ANGIOGRAPHY N/A 12/20/2016   Procedure: RIGHT/LEFT HEART CATH AND CORONARY ANGIOGRAPHY;  Surgeon: [name], MD;  Location: [facility];  Service: Cardiovascular;  Laterality: N/A;  . TEE WITHOUT CARDIOVERSION N/A 03/07/2017   Procedure: TRANSESOPHAGEAL ECHOCARDIOGRAM (TEE);  Surgeon: [name], [name], MD;  Location: [facility];  Service: Open Heart Surgery;  Laterality: N/A;  . TONSILLECTOMY      Short Social History:  Social History   Tobacco Use  . Smoking status: Never Smoker  . Smokeless tobacco: Never Used  Substance Use Topics  . Alcohol use: No    Allergies  Allergen Reactions  . Lipitor [Atorvastatin]     Leg aches     Current Outpatient Medications  Medication Sig Dispense Refill  . carvedilol (COREG) 6.25 MG tablet TAKE 1 TABLET (6.25 MG TOTAL) BY MOUTH 2 (TWO) TIMES DAILY WITH A MEAL. 180 tablet 1  . ELIQUIS 5 MG TABS tablet TAKE 1 TABLET  BY MOUTH TWICE A DAY 60 tablet 5  . Evolocumab (REPATHA SURECLICK) 409 MG/ML SOAJ Inject 1 pen into the skin every 14 (fourteen) days. 6 pen 3  . ferrous sulfate (FERROUSUL) 325 (65 FE) MG tablet Take 1 tablet (325 mg total) by mouth daily with breakfast.  3  . magnesium oxide (MAG-OX) 400 MG tablet Take 400 mg by mouth daily.    . Multiple Vitamins-Minerals (CENTRUM SILVER 50+MEN PO) Take 1 tablet by mouth daily.    Marland Kitchen torsemide (DEMADEX) 20 MG tablet TAKE 1 TABLET BY MOUTH EVERY DAY 90 tablet 3   No current facility-administered medications for this visit.    Review of Systems  Constitutional: Positive for fatigue.  HENT: HENT negative.  Eyes: Eyes negative.  Respiratory: Respiratory negative.  Cardiovascular: Cardiovascular negative.  GI: Gastrointestinal  negative.  Musculoskeletal: Musculoskeletal negative.  Neurological: Neurological negative. Hematologic: Hematologic/lymphatic negative.  Psychiatric: Psychiatric negative.        Objective:  Objective   There were no vitals filed for this visit. There is no height or weight on file to calculate BMI.  Physical Exam HENT:     Head: Normocephalic.     Nose:     Comments: Wearing a mask Eyes:     Pupils: Pupils are equal, round, and reactive to light.  Cardiovascular:     Pulses:          Radial pulses are 0 on the right side and 2+ on the left side.  Pulmonary:     Effort: Pulmonary effort is normal.  Abdominal:     General: Abdomen is flat.  Musculoskeletal:        General: No swelling. Normal range of motion.  Skin:    General: Skin is warm.  Neurological:     General: No focal deficit present.     Mental Status: He is alert.  Psychiatric:        Mood and Affect: Mood normal.        Behavior: Behavior normal.        Thought Content: Thought content normal.        Judgment: Judgment normal.     Data: +-----------------+-------------+----------+---------+  Right Cephalic  Diameter (cm)Depth (cm)Findings   +-----------------+-------------+----------+---------+  Shoulder       0.47     0.91         +-----------------+-------------+----------+---------+  Prox upper arm    0.41     0.42         +-----------------+-------------+----------+---------+  Mid upper arm    0.44     0.48  branching  +-----------------+-------------+----------+---------+  Dist upper arm    0.41     0.52         +-----------------+-------------+----------+---------+  Antecubital fossa  0.53     0.30         +-----------------+-------------+----------+---------+  Prox forearm     0.37     0.52  branching  +-----------------+-------------+----------+---------+  Mid forearm     0.40      0.36         +-----------------+-------------+----------+---------+  Dist forearm     0.24     0.32         +-----------------+-------------+----------+---------+  Wrist        0.21     0.33         +-----------------+-------------+----------+---------+   +-----------------+-------------+----------+---------+  Right Basilic  Diameter (cm)Depth (cm)Findings   +-----------------+-------------+----------+---------+  Prox upper arm    0.38     1.71         +-----------------+-------------+----------+---------+  Mid upper arm    0.30     0.88         +-----------------+-------------+----------+---------+  Dist upper arm    0.27     0.72         +-----------------+-------------+----------+---------+  Antecubital fossa  0.21     0.35         +-----------------+-------------+----------+---------+  Prox forearm     0.13     0.38  branching  +-----------------+-------------+----------+---------+  Mid forearm     0.15     0.30         +-----------------+-------------+----------+---------+  Distal forearm    0.11     0.33         +-----------------+-------------+----------+---------+  Wrist        0.11     0.29         +-----------------+-------------+----------+---------+   +-----------------+-------------+----------+---------+  Left Cephalic  Diameter (cm)Depth (cm)Findings   +-----------------+-------------+----------+---------+  Shoulder       0.41     0.50         +-----------------+-------------+----------+---------+  Prox upper arm    0.42     0.59         +-----------------+-------------+----------+---------+  Mid upper arm    0.42     0.58         +-----------------+-------------+----------+---------+  Dist upper arm    0.43     0.77          +-----------------+-------------+----------+---------+  Antecubital fossa  0.69     0.54         +-----------------+-------------+----------+---------+  Prox forearm     0.36     0.84         +-----------------+-------------+----------+---------+  Mid forearm     0.32     0.66         +-----------------+-------------+----------+---------+  Dist forearm     0.25     0.33  branching  +-----------------+-------------+----------+---------+  Wrist        0.24     0.30         +-----------------+-------------+----------+---------+   +-----------------+-------------+----------+---------+  Left Basilic   Diameter (cm)Depth (cm)Findings   +-----------------+-------------+----------+---------+  Prox upper arm    0.42     2.30         +-----------------+-------------+----------+---------+  Mid upper arm    0.29     2.26  branching  +-----------------+-------------+----------+---------+  Dist upper arm    0.23     1.53         +-----------------+-------------+----------+---------+  Antecubital fossa  0.22     0.58         +-----------------+-------------+----------+---------+  Prox forearm     0.18     0.37         +-----------------+-------------+----------+---------+  Mid forearm     0.12     0.42         +-----------------+-------------+----------+---------+  Distal forearm    0.11     0.43         +-----------------+-------------+----------+---------+         Assessment/Plan:     70 year old male here for evaluation of permanent dialysis access.  He is right-hand dominant does appear to have suitable vein on the left as well as an easily palpable left radial artery.  We will plan for left upper extremity fistula in the near future.  We will need to hold Eliquis for 48 hours  prior.  I discussed the risk benefits alternatives as well as the possibility of primary nonfunction and need for  further procedures in the future with the patient he demonstrates good understanding.     Waynetta Sandy MD [facility]

## 2020-04-18 ENCOUNTER — Encounter (HOSPITAL_COMMUNITY)
Admission: RE | Admit: 2020-04-18 | Discharge: 2020-04-18 | Disposition: A | Discharge status: Home / Self Care | Payer: PPO | Source: Ambulatory Visit | Attending: Nephrology | Admitting: Nephrology

## 2020-04-18 VITALS — BP 175/86 | HR 65 | Temp 97.5°F | Resp 18

## 2020-04-18 DIAGNOSIS — N179 Acute kidney failure, unspecified: Secondary | ICD-10-CM

## 2020-04-18 DIAGNOSIS — N184 Chronic kidney disease, stage 4 (severe): Secondary | ICD-10-CM | POA: Diagnosis not present

## 2020-04-18 LAB — POCT HEMOGLOBIN-HEMACUE: Hemoglobin: 9.2 g/dL — ABNORMAL LOW (ref 13.0–17.0)

## 2020-04-18 MED ORDER — EPOETIN ALFA-EPBX 10000 UNIT/ML IJ SOLN
20000.0000 [IU] | Freq: Once | INTRAMUSCULAR | Status: AC
Start: 2020-04-18 — End: 2020-04-18
  Administered 2020-04-18: 20000 [IU] via SUBCUTANEOUS

## 2020-04-18 MED ORDER — EPOETIN ALFA-EPBX 10000 UNIT/ML IJ SOLN: 10000.0000 [IU] | INTRAMUSCULAR | Status: DC

## 2020-04-18 MED ORDER — EPOETIN ALFA-EPBX 10000 UNIT/ML IJ SOLN
INTRAMUSCULAR | Status: AC
  Filled 2020-04-18: qty 2

## 2020-04-18 NOTE — Telephone Encounter (Signed)
I have called patient and advised him the form was ready for pick up. Patient verbalized understanding and said it will more than likely be tomorrow before they come pick up.

## 2020-04-19 ENCOUNTER — Ambulatory Visit (INDEPENDENT_AMBULATORY_CARE_PROVIDER_SITE_OTHER): Payer: PPO | Admitting: Podiatrist

## 2020-04-19 ENCOUNTER — Other Ambulatory Visit: Payer: Self-pay

## 2020-04-19 DIAGNOSIS — E1151 Type 2 diabetes mellitus with diabetic peripheral angiopathy without gangrene: Secondary | ICD-10-CM

## 2020-04-19 DIAGNOSIS — M216X1 Other acquired deformities of right foot: Secondary | ICD-10-CM | POA: Diagnosis not present

## 2020-04-19 DIAGNOSIS — E1159 Type 2 diabetes mellitus with other circulatory complications: Secondary | ICD-10-CM | POA: Diagnosis not present

## 2020-04-19 DIAGNOSIS — M2041 Other hammer toe(s) (acquired), right foot: Secondary | ICD-10-CM

## 2020-04-19 DIAGNOSIS — M21961 Unspecified acquired deformity of right lower leg: Secondary | ICD-10-CM

## 2020-04-19 DIAGNOSIS — M2042 Other hammer toe(s) (acquired), left foot: Secondary | ICD-10-CM | POA: Diagnosis not present

## 2020-04-19 DIAGNOSIS — E1142 Type 2 diabetes mellitus with diabetic polyneuropathy: Secondary | ICD-10-CM

## 2020-04-19 NOTE — Progress Notes (Signed)
The patient presented to the office to day to pick up diabetic shoes and 3 pr diabetic custom inserts.  1 pr of  inserts were put in the shoes and the shoes were fitted to the patient.  The patient states they are comfortable and free of defect. he was satisfied with the fit of the shoe.  Instructions for break in and wear were dispensed.  The patient signed the delivery documentation and break in instruction form.     If any questions or concerns arise, he is instructed to call.  Otherwise he will be seen back for his next scheduled appointment.

## 2020-04-25 ENCOUNTER — Encounter (HOSPITAL_COMMUNITY): Payer: PPO

## 2020-04-26 ENCOUNTER — Other Ambulatory Visit: Payer: Self-pay

## 2020-04-28 ENCOUNTER — Encounter (HOSPITAL_COMMUNITY)
Admission: RE | Admit: 2020-04-28 | Discharge: 2020-04-28 | Disposition: A | Discharge status: Home / Self Care | Payer: PPO | Source: Ambulatory Visit | Attending: Vascular Surgery | Admitting: Vascular Surgery

## 2020-04-28 ENCOUNTER — Other Ambulatory Visit: Payer: Self-pay

## 2020-05-01 ENCOUNTER — Telehealth (INDEPENDENT_AMBULATORY_CARE_PROVIDER_SITE_OTHER): Payer: PPO | Admitting: Family Medicine

## 2020-05-01 ENCOUNTER — Other Ambulatory Visit: Payer: Self-pay | Admitting: Cardiovascular Disease

## 2020-05-01 DIAGNOSIS — I2510 Atherosclerotic heart disease of native coronary artery without angina pectoris: Secondary | ICD-10-CM

## 2020-05-01 DIAGNOSIS — E782 Mixed hyperlipidemia: Secondary | ICD-10-CM

## 2020-05-01 DIAGNOSIS — J329 Chronic sinusitis, unspecified: Secondary | ICD-10-CM

## 2020-05-01 DIAGNOSIS — M7989 Other specified soft tissue disorders: Secondary | ICD-10-CM | POA: Diagnosis not present

## 2020-05-01 DIAGNOSIS — J310 Chronic rhinitis: Secondary | ICD-10-CM | POA: Diagnosis not present

## 2020-05-01 MED ORDER — FLUTICASONE PROPIONATE 50 MCG/ACT NA SUSP: 2.0000 | Freq: Every day | NASAL | 6 refills | Status: DC

## 2020-05-01 NOTE — Addendum Note (Signed)
Addended by: Sheral Flow on: 05/01/2020 11:58 AM   Modules accepted: Orders

## 2020-05-01 NOTE — Addendum Note (Signed)
Addended by: Jenna Luo T on: 05/01/2020 12:04 PM   Modules accepted: Orders

## 2020-05-01 NOTE — Progress Notes (Addendum)
Subjective:    Patient ID: Travis Blair, male    DOB: Jun 02, 1950, 70 y.o.   MRN: [id number]  HPI Patient is being seen today as a telephone visit.  Patient is currently at home.  I am currently in my office.  He consents to be seen via telephone.  Phone call began at 19.  Phone call concluded at 1110.  Patient states that his wife had a cold last week.  She is now better.  She took a negative Covid test last week at home.  Saturday night he went out to eat dinner and was out on the patio.  The patio was covered in pollen.  As soon as he ate dinner and had been in the pollen all day, he started developing runny nose and sinus pressure.  Has had a runny nose with sinus pressure since Saturday.  Today is day 2.  He denies any sinus pain.  He denies any headaches.  He reports significant postnasal drainage however.  He states that his throat is scratchy due to the drainage.  He also reports a dry nonproductive cough.  He denies any fevers or chills.  He denies any shortness of breath.  He denies any chest pain.  Past medical history significant for heart disease, diabetes, and chronic kidney disease. Past Medical History:  Diagnosis Date  . Anemia associated with chronic renal failure basically  . Asthma    as a child  . CHF (congestive heart failure) ([place])   . Chronic kidney disease    Sees Dr [name]  . Constipation   . Coronary artery disease   . Diabetes mellitus without complication ([place])   . DVT (deep venous thrombosis) ([place])   . History of blood clots   . Hyperlipidemia   . Hypertension   . Leg pain    ABIs/LE Arterial US 03/2019: ABIs unreliable; +calcification, no evidence of stenosis  . Myocardial infarction ([place])   . Obesity   . Pneumonia 2018  . Pulmonary embolism ([place])   . Sleep apnea    CPAP  . SOB (shortness of breath)   . Swelling of both lower extremities    Past Surgical History:  Procedure Laterality Date  . COLONOSCOPY    . CORONARY ARTERY BYPASS GRAFT N/A  03/07/2017   Procedure: CORONARY ARTERY BYPASS GRAFTING (CABG), X 3 , USING RIGHT INTERNAL MAMMARY ARTERY, AND RIGHT LEG GREATER SAPHENOUS VEIN HARVESTED ENDOSCOPICALLY;  Surgeon: [name], MD;  Location: [facility];  Service: Open Heart Surgery;  Laterality: N/A;  . HAND SURGERY Right 1991  . RIGHT/LEFT HEART CATH AND CORONARY ANGIOGRAPHY N/A 12/20/2016   Procedure: RIGHT/LEFT HEART CATH AND CORONARY ANGIOGRAPHY;  Surgeon: [name], MD;  Location: [facility];  Service: Cardiovascular;  Laterality: N/A;  . TEE WITHOUT CARDIOVERSION N/A 03/07/2017   Procedure: TRANSESOPHAGEAL ECHOCARDIOGRAM (TEE);  Surgeon: [name], [name], MD;  Location: [facility];  Service: Open Heart Surgery;  Laterality: N/A;  . TONSILLECTOMY     Current Outpatient Medications on File Prior to Visit  Medication Sig Dispense Refill  . REPATHA SURECLICK 287 MG/ML SOAJ INJECT 1 PEN INTO THE SKIN EVERY 14 (FOURTEEN) DAYS. 6 mL 3  . albuterol (VENTOLIN HFA) 108 (90 Base) MCG/ACT inhaler Inhale 1-2 puffs into the lungs every 6 (six) hours as needed for wheezing or shortness of breath.    Marland Kitchen aspirin EC 81 MG tablet Take 81 mg by mouth daily. Swallow whole.    . carvedilol (COREG)  6.25 MG tablet TAKE 1 TABLET (6.25 MG TOTAL) BY MOUTH 2 (TWO) TIMES DAILY WITH A MEAL. 180 tablet 1  . ELIQUIS 5 MG TABS tablet TAKE 1 TABLET BY MOUTH TWICE A DAY (Patient not taking: No sig reported) 60 tablet 5  . ferrous sulfate (FERROUSUL) 325 (65 FE) MG tablet Take 1 tablet (325 mg total) by mouth daily with breakfast.  3  . Multiple Vitamins-Minerals (CENTRUM SILVER 50+MEN PO) Take 1 tablet by mouth daily.    Marland Kitchen torsemide (DEMADEX) 20 MG tablet TAKE 1 TABLET BY MOUTH EVERY DAY (Patient taking differently: Take 20 mg by mouth daily.) 90 tablet 3   No current facility-administered medications on file prior to visit.   Allergies  Allergen Reactions  . Lipitor [Atorvastatin]     Leg aches    Social History   Socioeconomic History   . Marital status: Married    Spouse name: Not on file  . Number of children: 2  . Years of education: Not on file  . Highest education level: Not on file  Occupational History  . Occupation: general contractor-retired  Tobacco Use  . Smoking status: Never Smoker  . Smokeless tobacco: Never Used  Vaping Use  . Vaping Use: Never used  Substance and Sexual Activity  . Alcohol use: No  . Drug use: No  . Sexual activity: Never  Other Topics Concern  . Not on file  Social History Narrative  . Not on file   Social Determinants of Health   Financial Resource Strain: Not on file  Food Insecurity: Not on file  Transportation Needs: Not on file  Physical Activity: Not on file  Stress: Not on file  Social Connections: Not on file  Intimate Partner Violence: Not on file      Review of Systems  All other systems reviewed and are negative.      Objective:   Physical Exam        Assessment & Plan:  Rhinosinusitis  Symptoms sound like allergies or he potentially could have caught an upper respiratory infection from his wife that sounds viral.  He took a home COVID test this morning which was negative.  I have recommended trying Xyzal 5 mg a day coupled with Flonase 2 sprays each nostril daily.  If he develops fever or pain or shortness of breath he needs to be seen immediately however I suspect that this is most likely either a self-limited virus or potentially allergies.  Patient then came by and wanted me to see a lesion on his right shin.  There is weeping edema coming from 2 small sores on his anterior right shin.  Both sores are very shallow and are 1 cm in diameter however he has +2 pitting edema all the way up to his knees bilaterally.  The legs are tense and swollen.  He is taking torsemide 20 mg a day but he states it does not seem to be working.  Therefore I placed the patient in Unna boots bilaterally to help with third spacing and started him on torsemide 40 mg a day  with a plan to recheck the patient in 48 hours to try to diurese 5 pounds of swelling.

## 2020-05-01 NOTE — Addendum Note (Signed)
Addended by: Sheral Flow on: 05/01/2020 02:06 PM   Modules accepted: Orders

## 2020-05-02 ENCOUNTER — Encounter (HOSPITAL_COMMUNITY)
Admission: RE | Admit: 2020-05-02 | Discharge: 2020-05-02 | Disposition: A | Discharge status: Home / Self Care | Payer: PPO | Source: Ambulatory Visit | Attending: Nephrology | Admitting: Nephrology

## 2020-05-02 ENCOUNTER — Other Ambulatory Visit: Payer: Self-pay

## 2020-05-02 VITALS — BP 161/69 | HR 64 | Temp 98.2°F

## 2020-05-02 DIAGNOSIS — N184 Chronic kidney disease, stage 4 (severe): Secondary | ICD-10-CM | POA: Diagnosis not present

## 2020-05-02 DIAGNOSIS — N179 Acute kidney failure, unspecified: Secondary | ICD-10-CM

## 2020-05-02 LAB — BASIC METABOLIC PANEL WITH GFR
BUN/Creatinine Ratio: 15 (calc) (ref 6–22)
BUN: 49 mg/dL — ABNORMAL HIGH (ref 7–25)
CO2: 24 mmol/L (ref 20–32)
Calcium: 7.8 mg/dL — ABNORMAL LOW (ref 8.6–10.3)
Chloride: 106 mmol/L (ref 98–110)
Creat: 3.22 mg/dL — ABNORMAL HIGH (ref 0.70–1.25)
GFR, Est African American: 22 mL/min/{1.73_m2} — ABNORMAL LOW (ref >=60)
GFR, Est Non African American: 19 mL/min/{1.73_m2} — ABNORMAL LOW (ref >=60)
Glucose, Bld: 133 mg/dL — ABNORMAL HIGH (ref 65–99)
Potassium: 4.8 mmol/L (ref 3.5–5.3)
Sodium: 138 mmol/L (ref 135–146)

## 2020-05-02 LAB — FERRITIN: Ferritin: 41 ng/mL (ref 24–336)

## 2020-05-02 LAB — IRON AND TIBC
Iron: 21 ug/dL — ABNORMAL LOW (ref 45–182)
Saturation Ratios: 7 % — ABNORMAL LOW (ref 17.9–39.5)
TIBC: 319 ug/dL (ref 250–450)
UIBC: 298 ug/dL

## 2020-05-02 LAB — POCT HEMOGLOBIN-HEMACUE: Hemoglobin: 9.8 g/dL — ABNORMAL LOW (ref 13.0–17.0)

## 2020-05-02 MED ORDER — EPOETIN ALFA-EPBX 10000 UNIT/ML IJ SOLN
INTRAMUSCULAR | Status: AC
  Filled 2020-05-02: qty 2

## 2020-05-02 MED ORDER — EPOETIN ALFA-EPBX 10000 UNIT/ML IJ SOLN
20000.0000 [IU] | INTRAMUSCULAR | Status: DC
  Administered 2020-05-02: 20000 [IU] via SUBCUTANEOUS

## 2020-05-03 ENCOUNTER — Encounter: Payer: Self-pay | Admitting: Family Medicine

## 2020-05-03 ENCOUNTER — Other Ambulatory Visit (HOSPITAL_COMMUNITY)
Admission: RE | Admit: 2020-05-03 | Discharge: 2020-05-03 | Disposition: A | Discharge status: Home / Self Care | Payer: PPO | Source: Ambulatory Visit | Attending: Vascular Surgery | Admitting: Vascular Surgery

## 2020-05-03 ENCOUNTER — Other Ambulatory Visit: Payer: Self-pay

## 2020-05-03 ENCOUNTER — Ambulatory Visit (INDEPENDENT_AMBULATORY_CARE_PROVIDER_SITE_OTHER): Payer: PPO | Admitting: Family Medicine

## 2020-05-03 VITALS — BP 148/88 | HR 70 | Temp 98.1°F | Resp 14 | Ht 71.5 in | Wt 308.0 lb

## 2020-05-03 DIAGNOSIS — N184 Chronic kidney disease, stage 4 (severe): Secondary | ICD-10-CM | POA: Diagnosis not present

## 2020-05-03 DIAGNOSIS — Z01812 Encounter for preprocedural laboratory examination: Secondary | ICD-10-CM | POA: Diagnosis not present

## 2020-05-03 DIAGNOSIS — Z20822 Contact with and (suspected) exposure to covid-19: Secondary | ICD-10-CM | POA: Insufficient documentation

## 2020-05-03 DIAGNOSIS — M7989 Other specified soft tissue disorders: Secondary | ICD-10-CM | POA: Diagnosis not present

## 2020-05-03 DIAGNOSIS — R0602 Shortness of breath: Secondary | ICD-10-CM

## 2020-05-03 DIAGNOSIS — I2729 Other secondary pulmonary hypertension: Secondary | ICD-10-CM | POA: Diagnosis not present

## 2020-05-03 LAB — BASIC METABOLIC PANEL WITH GFR
BUN/Creatinine Ratio: 16 (calc) (ref 6–22)
BUN: 52 mg/dL — ABNORMAL HIGH (ref 7–25)
CO2: 27 mmol/L (ref 20–32)
Calcium: 8 mg/dL — ABNORMAL LOW (ref 8.6–10.3)
Chloride: 105 mmol/L (ref 98–110)
Creat: 3.25 mg/dL — ABNORMAL HIGH (ref 0.70–1.25)
GFR, Est African American: 21 mL/min/{1.73_m2} — ABNORMAL LOW (ref >=60)
GFR, Est Non African American: 19 mL/min/{1.73_m2} — ABNORMAL LOW (ref >=60)
Glucose, Bld: 169 mg/dL — ABNORMAL HIGH (ref 65–99)
Potassium: 4.8 mmol/L (ref 3.5–5.3)
Sodium: 137 mmol/L (ref 135–146)

## 2020-05-03 LAB — SARS CORONAVIRUS 2 (TAT 6-24 HRS): SARS Coronavirus 2: NEGATIVE

## 2020-05-03 MED ORDER — TORSEMIDE 20 MG PO TABS: 40.0000 mg | ORAL_TABLET | Freq: Every day | ORAL | 3 refills | Status: DC

## 2020-05-03 NOTE — Progress Notes (Signed)
Subjective:    Patient ID: Travis Blair, male    DOB: 09-30-1950, 70 y.o.   MRN: [id number]  HPI  05/01/20 Patient is being seen today as a telephone visit.  Patient is currently at home.  I am currently in my office.  He consents to be seen via telephone.  Phone call began at 37.  Phone call concluded at 1110.  Patient states that his wife had a cold last week.  She is now better.  She took a negative Covid test last week at home.  Saturday night he went out to eat dinner and was out on the patio.  The patio was covered in pollen.  As soon as he ate dinner and had been in the pollen all day, he started developing runny nose and sinus pressure.  Has had a runny nose with sinus pressure since Saturday.  Today is day 2.  He denies any sinus pain.  He denies any headaches.  He reports significant postnasal drainage however.  He states that his throat is scratchy due to the drainage.  He also reports a dry nonproductive cough.  He denies any fevers or chills.  He denies any shortness of breath.  He denies any chest pain.  Past medical history significant for heart disease, diabetes, and chronic kidney disease.  At that time, my plan was: Symptoms sound like allergies or he potentially could have caught an upper respiratory infection from his wife that sounds viral.  He took a home COVID test this morning which was negative.  I have recommended trying Xyzal 5 mg a day coupled with Flonase 2 sprays each nostril daily.  If he develops fever or pain or shortness of breath he needs to be seen immediately however I suspect that this is most likely either a self-limited virus or potentially allergies.  Patient then came by and wanted me to see a lesion on his right shin.  There is weeping edema coming from 2 small sores on his anterior right shin.  Both sores are very shallow and are 1 cm in diameter however he has +2 pitting edema all the way up to his knees bilaterally.  The legs are tense and swollen.  He  is taking torsemide 20 mg a day but he states it does not seem to be working.  Therefore I placed the patient in Unna boots bilaterally to help with third spacing and started him on torsemide 40 mg a day with a plan to recheck the patient in 48 hours to try to diurese 5 pounds of swelling.  05/03/20 Wt Readings from Last 3 Encounters:  05/03/20 (!) 308 lb (139.7 kg)  04/28/20 (!) 310 lb (140.6 kg)  04/17/20 (!) 313 lb (142 kg)  Patient has lost a little bit of weight since I saw him.  We removed his lipids today.  The swelling in his legs is better although he still has +1 edema up to his knees.  His blood pressure has improved considerably since increasing his torsemide.  Clinically he still appears to have right-sided heart failure.  His echocardiogram in November showed severe pulmonary hypertension.  This was obviously after he had a pulmonary embolism and the thought was that the PE was the most likely cause of the pulmonary hypertension.  He has not had a repeat echocardiogram that I can see.  He has now completed 4 months of anticoagulation.  He states that he feels better on the higher dose of the diuretic and  his breathing is better.  Therefore as long as his renal function tolerated I had like to leave him on torsemide 40 mg a day.  His creatinine when I checked it 2 days ago was 3.22.  He continues to have petechia and purpura on both shins.  I believe this is due to a combination of his anticoagulation, renal induced platelet disorder, and leg swelling  Past Medical History:  Diagnosis Date  . Anemia associated with chronic renal failure basically  . Asthma    as a child  . CHF (congestive heart failure) ([place])   . Chronic kidney disease    Sees Dr [name]  . Constipation   . Coronary artery disease   . Diabetes mellitus without complication ([place])   . DVT (deep venous thrombosis) ([place])   . History of blood clots   . Hyperlipidemia   . Hypertension   . Leg pain    ABIs/LE Arterial US  03/2019: ABIs unreliable; +calcification, no evidence of stenosis  . Myocardial infarction ([place])   . Obesity   . Pneumonia 2018  . Pulmonary embolism ([place])   . Sleep apnea    CPAP  . SOB (shortness of breath)   . Swelling of both lower extremities    Past Surgical History:  Procedure Laterality Date  . COLONOSCOPY    . CORONARY ARTERY BYPASS GRAFT N/A 03/07/2017   Procedure: CORONARY ARTERY BYPASS GRAFTING (CABG), X 3 , USING RIGHT INTERNAL MAMMARY ARTERY, AND RIGHT LEG GREATER SAPHENOUS VEIN HARVESTED ENDOSCOPICALLY;  Surgeon: [name], MD;  Location: [facility];  Service: Open Heart Surgery;  Laterality: N/A;  . HAND SURGERY Right 1991  . RIGHT/LEFT HEART CATH AND CORONARY ANGIOGRAPHY N/A 12/20/2016   Procedure: RIGHT/LEFT HEART CATH AND CORONARY ANGIOGRAPHY;  Surgeon: [name], MD;  Location: [facility];  Service: Cardiovascular;  Laterality: N/A;  . TEE WITHOUT CARDIOVERSION N/A 03/07/2017   Procedure: TRANSESOPHAGEAL ECHOCARDIOGRAM (TEE);  Surgeon: [name], [name], MD;  Location: [facility];  Service: Open Heart Surgery;  Laterality: N/A;  . TONSILLECTOMY     Current Outpatient Medications on File Prior to Visit  Medication Sig Dispense Refill  . albuterol (VENTOLIN HFA) 108 (90 Base) MCG/ACT inhaler Inhale 1-2 puffs into the lungs every 6 (six) hours as needed for wheezing or shortness of breath.    Marland Kitchen aspirin EC 81 MG tablet Take 81 mg by mouth daily. Swallow whole.    . carvedilol (COREG) 6.25 MG tablet TAKE 1 TABLET (6.25 MG TOTAL) BY MOUTH 2 (TWO) TIMES DAILY WITH A MEAL. 180 tablet 1  . ELIQUIS 5 MG TABS tablet TAKE 1 TABLET BY MOUTH TWICE A DAY 60 tablet 5  . ferrous sulfate (FERROUSUL) 325 (65 FE) MG tablet Take 1 tablet (325 mg total) by mouth daily with breakfast.  3  . fluticasone (FLONASE) 50 MCG/ACT nasal spray Place 2 sprays into both nostrils daily. 16 g 6  . Multiple Vitamins-Minerals (CENTRUM SILVER 50+MEN PO) Take 1 tablet by mouth daily.    Marland Kitchen  REPATHA SURECLICK 063 MG/ML SOAJ INJECT 1 PEN INTO THE SKIN EVERY 14 (FOURTEEN) DAYS. 6 mL 3  . torsemide (DEMADEX) 20 MG tablet TAKE 1 TABLET BY MOUTH EVERY DAY (Patient taking differently: Take 20 mg by mouth daily.) 90 tablet 3   No current facility-administered medications on file prior to visit.   Allergies  Allergen Reactions  . Lipitor [Atorvastatin]     Leg aches    Social History  Socioeconomic History  . Marital status: Married    Spouse name: Not on file  . Number of children: 2  . Years of education: Not on file  . Highest education level: Not on file  Occupational History  . Occupation: general contractor-retired  Tobacco Use  . Smoking status: Never Smoker  . Smokeless tobacco: Never Used  Vaping Use  . Vaping Use: Never used  Substance and Sexual Activity  . Alcohol use: No  . Drug use: No  . Sexual activity: Never  Other Topics Concern  . Not on file  Social History Narrative  . Not on file   Social Determinants of Health   Financial Resource Strain: Not on file  Food Insecurity: Not on file  Transportation Needs: Not on file  Physical Activity: Not on file  Stress: Not on file  Social Connections: Not on file  Intimate Partner Violence: Not on file      Review of Systems  All other systems reviewed and are negative.      Objective:   Physical Exam Vitals reviewed.  Constitutional:      General: He is not in acute distress.    Appearance: He is obese. He is not ill-appearing or toxic-appearing.  Cardiovascular:     Rate and Rhythm: Normal rate.     Heart sounds: Normal heart sounds. No murmur heard. No gallop.   Pulmonary:     Effort: Pulmonary effort is normal. No respiratory distress.     Breath sounds: No wheezing or rhonchi.  Musculoskeletal:     Right lower leg: Edema present.     Left lower leg: Edema present.  Neurological:     Mental Status: He is alert.           Assessment & Plan:  CKD (chronic kidney disease)  stage 4, GFR 15-29 ml/min (HCC) - Plan: BASIC METABOLIC PANEL WITH GFR  Leg swelling - Plan: ECHOCARDIOGRAM COMPLETE  SOB (shortness of breath) on exertion - Plan: ECHOCARDIOGRAM COMPLETE  Pulmonary hypertension associated with unclear multi-factorial mechanisms ([place]) - Plan: ECHOCARDIOGRAM COMPLETE  The edema in the patient's legs has improved.  I believe that he is doing a better job on torsemide 40 mg a day.  He is asking for refill.  I plan to keep him on 40 mg a day assuming that his renal function will tolerate it.  Therefore I would like to check a BMP today to recheck his creatinine.  As long as it is stable we will leave him on torsemide 40 mg a day.  However he has the classic signs of right-sided heart failure.  I would like to repeat an echocardiogram to see if there is still pulmonary hypertension despite being treated with anticoagulation for his pulmonary embolism for more than 4 months.  If he has pulmonary hypertension still, he may benefit from Cialis, 10 to 20 mg a day which may help treat the right-sided heart failure.  This would also improve his leg swelling.  I also recommended the patient discontinue the Unna boots and replace them with compression stockings, 15 to 20 mmHg daily.

## 2020-05-04 ENCOUNTER — Encounter (HOSPITAL_COMMUNITY)
Admission: RE | Disposition: A | Discharge status: Home / Self Care | Payer: Self-pay | Source: Home / Self Care | Attending: Vascular Surgery

## 2020-05-04 ENCOUNTER — Ambulatory Visit (HOSPITAL_COMMUNITY)
Admission: RE | Admit: 2020-05-04 | Discharge: 2020-05-04 | Disposition: A | Discharge status: Home / Self Care | Payer: PPO | Attending: Vascular Surgery | Admitting: Vascular Surgery

## 2020-05-04 ENCOUNTER — Encounter (HOSPITAL_COMMUNITY): Payer: Self-pay | Admitting: Vascular Surgery

## 2020-05-04 ENCOUNTER — Ambulatory Visit (HOSPITAL_COMMUNITY): Payer: PPO | Admitting: Anesthesiology

## 2020-05-04 DIAGNOSIS — Z79899 Other long term (current) drug therapy: Secondary | ICD-10-CM | POA: Diagnosis not present

## 2020-05-04 DIAGNOSIS — D631 Anemia in chronic kidney disease: Secondary | ICD-10-CM | POA: Diagnosis not present

## 2020-05-04 DIAGNOSIS — Z7901 Long term (current) use of anticoagulants: Secondary | ICD-10-CM | POA: Diagnosis not present

## 2020-05-04 DIAGNOSIS — E1122 Type 2 diabetes mellitus with diabetic chronic kidney disease: Secondary | ICD-10-CM | POA: Diagnosis not present

## 2020-05-04 DIAGNOSIS — I509 Heart failure, unspecified: Secondary | ICD-10-CM | POA: Diagnosis not present

## 2020-05-04 DIAGNOSIS — I120 Hypertensive chronic kidney disease with stage 5 chronic kidney disease or end stage renal disease: Secondary | ICD-10-CM | POA: Diagnosis not present

## 2020-05-04 DIAGNOSIS — N186 End stage renal disease: Secondary | ICD-10-CM | POA: Insufficient documentation

## 2020-05-04 DIAGNOSIS — N179 Acute kidney failure, unspecified: Secondary | ICD-10-CM

## 2020-05-04 DIAGNOSIS — N185 Chronic kidney disease, stage 5: Secondary | ICD-10-CM | POA: Diagnosis not present

## 2020-05-04 HISTORY — PX: AV FISTULA PLACEMENT: SHX1204

## 2020-05-04 LAB — POCT I-STAT, CHEM 8
BUN: 50 mg/dL — ABNORMAL HIGH (ref 8–23)
Calcium, Ion: 1.19 mmol/L (ref 1.15–1.40)
Chloride: 103 mmol/L (ref 98–111)
Creatinine, Ser: 3.4 mg/dL — ABNORMAL HIGH (ref 0.61–1.24)
Glucose, Bld: 155 mg/dL — ABNORMAL HIGH (ref 70–99)
HCT: 32 % — ABNORMAL LOW (ref 39.0–52.0)
Hemoglobin: 10.9 g/dL — ABNORMAL LOW (ref 13.0–17.0)
Potassium: 4.4 mmol/L (ref 3.5–5.1)
Sodium: 139 mmol/L (ref 135–145)
TCO2: 26 mmol/L (ref 22–32)

## 2020-05-04 LAB — GLUCOSE, CAPILLARY: Glucose-Capillary: 149 mg/dL — ABNORMAL HIGH (ref 70–99)

## 2020-05-04 SURGERY — ARTERIOVENOUS (AV) FISTULA CREATION
Anesthesia: General | Laterality: Left

## 2020-05-04 MED ORDER — ORAL CARE MOUTH RINSE: 15.0000 mL | Freq: Once | OROMUCOSAL | Status: DC

## 2020-05-04 MED ORDER — DEXTROSE 5 % IV SOLN
3.0000 g | INTRAVENOUS | Status: AC
  Administered 2020-05-04: 3 g via INTRAVENOUS

## 2020-05-04 MED ORDER — 0.9 % SODIUM CHLORIDE (POUR BTL) OPTIME
TOPICAL | Status: DC | PRN
  Administered 2020-05-04: 1000 mL

## 2020-05-04 MED ORDER — CHLORHEXIDINE GLUCONATE 0.12 % MT SOLN: 15.0000 mL | Freq: Once | OROMUCOSAL | Status: DC

## 2020-05-04 MED ORDER — LIDOCAINE-EPINEPHRINE 0.5 %-1:200000 IJ SOLN
INTRAMUSCULAR | Status: DC | PRN
  Administered 2020-05-04: 8 mL

## 2020-05-04 MED ORDER — LACTATED RINGERS IV SOLN: INTRAVENOUS | Status: DC

## 2020-05-04 MED ORDER — LIDOCAINE HCL (PF) 2 % IJ SOLN
INTRAMUSCULAR | Status: AC
  Filled 2020-05-04: qty 5

## 2020-05-04 MED ORDER — OXYCODONE-ACETAMINOPHEN 5-325 MG PO TABS
1.0000 | ORAL_TABLET | Freq: Four times a day (QID) | ORAL | 0 refills | Status: DC | PRN
Start: 2020-05-04 — End: 2020-07-03

## 2020-05-04 MED ORDER — CEFAZOLIN SODIUM-DEXTROSE 1-4 GM/50ML-% IV SOLN
INTRAVENOUS | Status: AC
  Filled 2020-05-04: qty 50

## 2020-05-04 MED ORDER — PROPOFOL 10 MG/ML IV BOLUS
INTRAVENOUS | Status: DC | PRN
  Administered 2020-05-04: 20 mg via INTRAVENOUS
  Administered 2020-05-04 (×2): 10 mg via INTRAVENOUS

## 2020-05-04 MED ORDER — LIDOCAINE HCL (CARDIAC) PF 50 MG/5ML IV SOSY
PREFILLED_SYRINGE | INTRAVENOUS | Status: DC | PRN
  Administered 2020-05-04: 50 mg via INTRAVENOUS

## 2020-05-04 MED ORDER — CHLORHEXIDINE GLUCONATE 4 % EX LIQD: 60.0000 mL | Freq: Once | CUTANEOUS | Status: DC

## 2020-05-04 MED ORDER — FENTANYL CITRATE (PF) 100 MCG/2ML IJ SOLN
INTRAMUSCULAR | Status: AC
  Filled 2020-05-04: qty 2

## 2020-05-04 MED ORDER — PROPOFOL 500 MG/50ML IV EMUL
INTRAVENOUS | Status: DC | PRN
  Administered 2020-05-04: 35 ug/kg/min via INTRAVENOUS
  Administered 2020-05-04: 15 ug/kg/min via INTRAVENOUS

## 2020-05-04 MED ORDER — HEPARIN SODIUM (PORCINE) 1000 UNIT/ML IJ SOLN
INTRAMUSCULAR | Status: AC
  Filled 2020-05-04: qty 6

## 2020-05-04 MED ORDER — SODIUM CHLORIDE 0.9 % IV SOLN
INTRAVENOUS | Status: DC | PRN
  Administered 2020-05-04: 500 mL

## 2020-05-04 MED ORDER — SODIUM CHLORIDE 0.9 % IV SOLN: INTRAVENOUS | Status: DC

## 2020-05-04 MED ORDER — CEFAZOLIN SODIUM-DEXTROSE 2-4 GM/100ML-% IV SOLN
INTRAVENOUS | Status: AC
  Filled 2020-05-04: qty 100

## 2020-05-04 MED ORDER — FENTANYL CITRATE (PF) 100 MCG/2ML IJ SOLN
INTRAMUSCULAR | Status: DC | PRN
  Administered 2020-05-04 (×2): 25 ug via INTRAVENOUS

## 2020-05-04 SURGICAL SUPPLY — 37 items
ADH SKN CLS APL DERMABOND .7 (GAUZE/BANDAGES/DRESSINGS) ×1
ARMBAND PINK RESTRICT EXTREMIT (MISCELLANEOUS) ×2 IMPLANT
BAG HAMPER (MISCELLANEOUS) ×2 IMPLANT
CANNULA VESSEL 3MM 2 BLNT TIP (CANNULA) ×2 IMPLANT
CLIP LIGATING EXTRA MED SLVR (CLIP) ×2 IMPLANT
CLIP LIGATING EXTRA SM BLUE (MISCELLANEOUS) ×2 IMPLANT
COVER LIGHT HANDLE STERIS (MISCELLANEOUS) ×4 IMPLANT
COVER MAYO STAND XLG (MISCELLANEOUS) ×2 IMPLANT
COVER WAND RF STERILE (DRAPES) ×2 IMPLANT
DECANTER SPIKE VIAL GLASS SM (MISCELLANEOUS) ×2 IMPLANT
DERMABOND ADVANCED (GAUZE/BANDAGES/DRESSINGS) ×1
DERMABOND ADVANCED .7 DNX12 (GAUZE/BANDAGES/DRESSINGS) ×1 IMPLANT
ELECT REM PT RETURN 9FT ADLT (ELECTROSURGICAL) ×2
ELECTRODE REM PT RTRN 9FT ADLT (ELECTROSURGICAL) ×1 IMPLANT
GAUZE SPONGE 4X4 12PLY STRL (GAUZE/BANDAGES/DRESSINGS) ×2 IMPLANT
GLOVE SS BIOGEL STRL SZ 7.5 (GLOVE) ×1 IMPLANT
GLOVE SUPERSENSE BIOGEL SZ 7.5 (GLOVE) ×1
GLOVE SURG UNDER POLY LF SZ7 (GLOVE) ×6 IMPLANT
GOWN STRL REUS W/TWL LRG LVL3 (GOWN DISPOSABLE) ×6 IMPLANT
IV NS 500ML (IV SOLUTION) ×2
IV NS 500ML BAXH (IV SOLUTION) ×1 IMPLANT
KIT BLADEGUARD II DBL (SET/KITS/TRAYS/PACK) ×2 IMPLANT
KIT TURNOVER KIT A (KITS) ×2 IMPLANT
MANIFOLD NEPTUNE II (INSTRUMENTS) ×2 IMPLANT
MARKER SKIN DUAL TIP RULER LAB (MISCELLANEOUS) ×4 IMPLANT
NEEDLE HYPO 18GX1.5 BLUNT FILL (NEEDLE) ×2 IMPLANT
NS IRRIG 1000ML POUR BTL (IV SOLUTION) ×2 IMPLANT
PACK CV ACCESS (CUSTOM PROCEDURE TRAY) ×2 IMPLANT
PAD ARMBOARD 7.5X6 YLW CONV (MISCELLANEOUS) ×4 IMPLANT
SET BASIN LINEN APH (SET/KITS/TRAYS/PACK) ×2 IMPLANT
SOL PREP POV-IOD 4OZ 10% (MISCELLANEOUS) ×2 IMPLANT
SOL PREP PROV IODINE SCRUB 4OZ (MISCELLANEOUS) ×2 IMPLANT
SUT PROLENE 6 0 CC (SUTURE) ×2 IMPLANT
SUT VIC AB 3-0 SH 27 (SUTURE) ×2
SUT VIC AB 3-0 SH 27X BRD (SUTURE) ×1 IMPLANT
SYR CONTROL 10ML LL (SYRINGE) ×2 IMPLANT
UNDERPAD 30X36 HEAVY ABSORB (UNDERPADS AND DIAPERS) ×2 IMPLANT

## 2020-05-04 NOTE — Op Note (Signed)
° ° °  OPERATIVE REPORT  DATE OF SURGERY: 05/04/2020  PATIENT: Travis Blair, 70 y.o. male MRN: [id number]  DOB: 1950/12/27  PRE-OPERATIVE DIAGNOSIS: Chronic renal insufficiency  POST-OPERATIVE DIAGNOSIS:  Same  PROCEDURE: Left arm brachiocephalic AV fistula  SURGEON:  Curt Jews, M.D.  PHYSICIAN ASSISTANT: Nurse  The assistant was needed for exposure and to expedite the case  ANESTHESIA: Local with sedation  EBL: per anesthesia record  Total I/O In: 400 [I.V.:350; IV Piggyback:50] Out: 10 [Blood:10]  BLOOD ADMINISTERED: none  DRAINS: none  SPECIMEN: none  COUNTS CORRECT:  YES  PATIENT DISPOSITION:  PACU - hemodynamically stable  PROCEDURE DETAILS: The patient was taken operating placed to position under the left arm prepped draped you sterile fashion.  Using local anesthesia incision was made over the antecubital space.  SonoSite ultrasound revealed a large cephalic vein at the antecubital space.  The vein had multiple branches and was too small for use in the forearm.  The brachial artery was exposed through the same incision was of large caliber with minimal atherosclerotic change.  The the vein was mobilized proximally and distally above the antecubital space and tributary branches were ligated with 3-0 silk ties and divided.  The vein was ligated distally and divided.  The vein was brought into approximation of the brachial artery.  The artery was occluded proximally distally with fistula clamps and was opened with an 11 blade and sent ulcerating with Potts scissors.  The vein was cut to the appropriate length and was spatulated and sewn end-to-side to the artery with a running 6-0 Prolene suture.  Clamps were removed and excellent thrill was noted.  The wounds irrigated with saline.  Hemostasis left cautery.  Wounds were closed with 3-0 Vicryl in the subcutaneous and subcuticular tissue.  The patient maintained a left radial pulse.   Rosetta Posner, M.D.,  [facility] 05/04/2020 10:50 AM  Note: Portions of this report may have been transcribed using voice recognition software.  Every effort has been made to ensure accuracy; however, inadvertent computerized transcription errors may still be present.

## 2020-05-04 NOTE — Anesthesia Procedure Notes (Signed)
Date/Time: 05/04/2020 10:00 AM Performed by: Vista Deck, CRNA Pre-anesthesia Checklist: Patient identified, Emergency Drugs available, Suction available, Timeout performed and Patient being monitored Patient Re-evaluated:Patient Re-evaluated prior to induction Oxygen Delivery Method: Non-rebreather mask

## 2020-05-04 NOTE — Anesthesia Procedure Notes (Signed)
Date/Time: 05/04/2020 9:36 AM Performed by: Vista Deck, CRNA Pre-anesthesia Checklist: Patient identified, Emergency Drugs available, Suction available, Timeout performed and Patient being monitored Patient Re-evaluated:Patient Re-evaluated prior to induction Oxygen Delivery Method: Nasal Cannula

## 2020-05-04 NOTE — Discharge Instructions (Signed)
[facility]  Discharge Instructions  AV Fistula or Graft Surgery for Dialysis Access  Please refer to the following instructions for your post-procedure care. Your surgeon or physician assistant will discuss any changes with you.  Activity  You may drive the day following your surgery, if you are comfortable and no longer taking prescription pain medication. Resume full activity as the soreness in your incision resolves.  Bathing/Showering  You may shower after you go home. Keep your incision dry for 48 hours. Do not soak in a bathtub, hot tub, or swim until the incision heals completely. You may not shower if you have a hemodialysis catheter.  Incision Care  Clean your incision with mild soap and water after 48 hours. Pat the area dry with a clean towel. You do not need a bandage unless otherwise instructed. Do not apply any ointments or creams to your incision. You may have skin glue on your incision. Do not peel it off. It will come off on its own in about one week. Your arm may swell a bit after surgery. To reduce swelling use pillows to elevate your arm so it is above your heart. Your doctor will tell you if you need to lightly wrap your arm with an ACE bandage.  Diet  Resume your normal diet. There are not special food restrictions following this procedure. In order to heal from your surgery, it is CRITICAL to get adequate nutrition. Your body requires vitamins, minerals, and protein. Vegetables are the best source of vitamins and minerals. Vegetables also provide the perfect balance of protein. Processed food has little nutritional value, so try to avoid this.  Medications  Resume taking all of your medications. If your incision is causing pain, you may take over-the counter pain relievers such as acetaminophen (Tylenol). If you were prescribed a stronger pain medication, please be aware these medications can cause nausea and constipation. Prevent  nausea by taking the medication with a snack or meal. Avoid constipation by drinking plenty of fluids and eating foods with high amount of fiber, such as fruits, vegetables, and grains.  Do not take Tylenol if you are taking prescription pain medications.  Follow up Your surgeon may want to see you in the office following your access surgery. If so, this will be arranged at the time of your surgery.  Please call us immediately for any of the following conditions:  . Increased pain, redness, drainage (pus) from your incision site . Fever of 101 degrees or higher . Severe or worsening pain at your incision site . Hand pain or numbness. .  Reduce your risk of vascular disease:  . Stop smoking. If you would like help, call [facility] at 1-800-QUIT-NOW (269)131-6524) or [place] at 581 805 8643  . Manage your cholesterol . Maintain a desired weight . Control your diabetes . Keep your blood pressure down  Dialysis  It will take several weeks to several months for your new dialysis access to be ready for use. Your surgeon will determine when it is okay to use it. Your nephrologist will continue to direct your dialysis. You can continue to use your Permcath until your new access is ready for use.   05/04/2020 [facility] [id number] 28-Nov-1950  Surgeon(s): Early, Arvilla Meres, MD  Procedure(s): LEFT ARM ARTERIOVENOUS (AV) FISTULA CREATION   May stick graft immediately   May stick graft on designated area only:    Do not stick fistula  for 12 weeks  If you have any questions, please call the office at [phone].      General Anesthesia, Adult, Care After This sheet gives you information about how to care for yourself after your procedure. Your health care provider may also give you more specific instructions. If you have problems or questions, contact your health care provider. What can I expect after the procedure? After the procedure, the following side effects  are common:  Pain or discomfort at the IV site.  Nausea.  Vomiting.  Sore throat.  Trouble concentrating.  Feeling cold or chills.  Feeling weak or tired.  Sleepiness and fatigue.  Soreness and body aches. These side effects can affect parts of the body that were not involved in surgery. Follow these instructions at home: For the time period you were told by your health care provider:  Rest.  Do not participate in activities where you could fall or become injured.  Do not drive or use machinery.  Do not drink alcohol.  Do not take sleeping pills or medicines that cause drowsiness.  Do not make important decisions or sign legal documents.  Do not take care of children on your own.   Eating and drinking  Follow any instructions from your health care provider about eating or drinking restrictions.  When you feel hungry, start by eating small amounts of foods that are soft and easy to digest (bland), such as toast. Gradually return to your regular diet.  Drink enough fluid to keep your urine pale yellow.  If you vomit, rehydrate by drinking water, juice, or clear broth. General instructions  If you have sleep apnea, surgery and certain medicines can increase your risk for breathing problems. Follow instructions from your health care provider about wearing your sleep device: ? Anytime you are sleeping, including during daytime naps. ? While taking prescription pain medicines, sleeping medicines, or medicines that make you drowsy.  Have a responsible adult stay with you for the time you are told. It is important to have someone help care for you until you are awake and alert.  Return to your normal activities as told by your health care provider. Ask your health care provider what activities are safe for you.  Take over-the-counter and prescription medicines only as told by your health care provider.  If you smoke, do not smoke without supervision.  Keep all  follow-up visits as told by your health care provider. This is important. Contact a health care provider if:  You have nausea or vomiting that does not get better with medicine.  You cannot eat or drink without vomiting.  You have pain that does not get better with medicine.  You are unable to pass urine.  You develop a skin rash.  You have a fever.  You have redness around your IV site that gets worse. Get help right away if:  You have difficulty breathing.  You have chest pain.  You have blood in your urine or stool, or you vomit blood. Summary  After the procedure, it is common to have a sore throat or nausea. It is also common to feel tired.  Have a responsible adult stay with you for the time you are told. It is important to have someone help care for you until you are awake and alert.  When you feel hungry, start by eating small amounts of foods that are soft and easy to digest (bland), such as toast. Gradually return to your regular diet.  Drink enough fluid to keep  your urine pale yellow.  Return to your normal activities as told by your health care provider. Ask your health care provider what activities are safe for you. This information is not intended to replace advice given to you by your health care provider. Make sure you discuss any questions you have with your health care provider. Document Revised: 10/14/2019 Document Reviewed: 05/13/2019 Elsevier Patient Education  2021 [employer].    Acetaminophen; Oxycodone tablets What is this medicine? ACETAMINOPHEN; OXYCODONE (a set a MEE noe fen; ox i KOE done) is a pain reliever. It is used to treat moderate to severe pain. This medicine may be used for other purposes; ask your health care provider or pharmacist if you have questions. COMMON BRAND NAME(S): Endocet, Magnacet, Nalocet, Narvox, Percocet, Perloxx, Primalev, Primlev, Prolate, Roxicet, Xolox What should I tell my health care provider before I take  this medicine? They need to know if you have any of these conditions:  brain tumor  drug abuse or addiction  head injury  heart disease  if you often drink alcohol   kidney disease   liver disease  low adrenal gland function  lung disease, asthma, or breathing problem  seizures  stomach or intestine problems  taken an MAOI like Marplan, Nardil, or Parnate in the last 14 days  an unusual or allergic reaction to acetaminophen, oxycodone, other medicines, foods, dyes, or preservative  pregnant or trying to get pregnant  breast-feeding How should I use this medicine? Take this medicine by mouth with a full glass of water. Take it as directed on the label. You can take it with or without food. If it upsets your stomach, take it with food. Do not use it more often than directed. There may be unused or extra doses in the bottle after you finish your treatment. Talk to your health care provider if you have questions about your dose. A special MedGuide will be given to you by the pharmacist with each prescription and refill. Be sure to read this information carefully each time. Talk to your health care provider about the use of this medicine in children. Special care may be needed. Patients over 10 years of age may have a stronger reaction and need a smaller dose. Overdosage: If you think you have taken too much of this medicine contact a poison control center or emergency room at once. NOTE: This medicine is only for you. Do not share this medicine with others. What if I miss a dose? This does not apply. This medicine is not for regular use. It should only be used as needed. What may interact with this medicine? This medicine may interact with the following medications:  alcohol  antihistamines for allergy, cough and cold  antiviral medicines for HIV or AIDS  atropine  certain antibiotics like clarithromycin, erythromycin, linezolid, rifampin  certain medicines for  anxiety or sleep  certain medicines for bladder problems like oxybutynin, tolterodine  certain medicines for depression like amitriptyline, fluoxetine, sertraline  certain medicines for fungal infections like ketoconazole, itraconazole, voriconazole  certain medicines for migraine headache like almotriptan, eletriptan, frovatriptan, naratriptan, rizatriptan, sumatriptan, zolmitriptan  certain medicines for nausea or vomiting like dolasetron, ondansetron, palonosetron  certain medicines for Parkinson's disease like benztropine, trihexyphenidyl  certain medicines for seizures like phenobarbital, phenytoin, primidone  certain medicines for stomach problems like dicyclomine, hyoscyamine  certain medicines for travel sickness like scopolamine  diuretics  general anesthetics like halothane, isoflurane, methoxyflurane, propofol  ipratropium  local anesthetics like lidocaine, pramoxine, tetracaine  MAOIs like Carbex, Eldepryl, Marplan, Nardil, and Parnate  medicines that relax muscles for surgery  methylene blue  nilotinib  other medicines with acetaminophen  other narcotic medicines for pain or cough  phenothiazines like chlorpromazine, mesoridazine, prochlorperazine, thioridazine This list may not describe all possible interactions. Give your health care provider a list of all the medicines, herbs, non-prescription drugs, or dietary supplements you use. Also tell them if you smoke, drink alcohol, or use illegal drugs. Some items may interact with your medicine. What should I watch for while using this medicine? Tell your health care provider if your pain does not go away, if it gets worse, or if you have new or a different type of pain. You may develop tolerance to this drug. Tolerance means that you will need a higher dose of the drug for pain relief. Tolerance is normal and is expected if you take this drug for a long time. There are different types of narcotic drugs (opioids)  for pain. If you take more than one type at the same time, you may have more side effects. Give your health care provider a list of all drugs you use. He or she will tell you how much drug to take. Do not take more drug than directed. Get emergency help right away if you have problems breathing. Do not suddenly stop taking your drug because you may develop a severe reaction. Your body becomes used to the drug. This does NOT mean you are addicted. Addiction is a behavior related to getting and using a drug for a nonmedical reason. If you have pain, you have a medical reason to take pain drug. Your health care provider will tell you how much drug to take. If your health care provider wants you to stop the drug, the dose will be slowly lowered over time to avoid any side effects. Talk to your health care provider about naloxone and how to get it. Naloxone is an emergency drug used for an opioid overdose. An overdose can happen if you take too much opioid. It can also happen if an opioid is taken with some other drugs or substances, like alcohol. Know the symptoms of an overdose, like trouble breathing, unusually tired or sleepy, or not being able to respond or wake up. Make sure to tell caregivers and close contacts where it is stored. Make sure they know how to use it. After naloxone is given, you must get emergency help right away. Naloxone is a temporary treatment. Repeat doses may be needed. Do not take other drugs that contain acetaminophen with this drug. Many non-prescription drugs contain acetaminophen. Always read labels carefully. If you have questions, ask your health care provider. If you take too much acetaminophen, get medical help right away. Too much acetaminophen can be very dangerous and cause liver damage. Even if you do not have symptoms, it is important to get help right away. This drug does not prevent a heart attack or stroke. This drug may increase the chance of a heart attack or stroke.  The chance may increase the longer you use this drug or if you have heart disease. If you take aspirin to prevent a heart attack or stroke, talk to your health care provider about using this drug. You may get drowsy or dizzy. Do not drive, use machinery, or do anything that needs mental alertness until you know how this drug affects you. Do not stand up or sit up quickly, especially if you are an older patient. This  reduces the risk of dizzy or fainting spells. Alcohol may interfere with the effect of this drug. Avoid alcoholic drinks. This drug will cause constipation. If you do not have a bowel movement for 3 days, call your health care provider. Your mouth may get dry. Chewing sugarless gum or sucking hard candy and drinking plenty of water may help. Contact your health care provider if the problem does not go away or is severe. What side effects may I notice from receiving this medicine? Side effects that you should report to your doctor or health care professional as soon as possible:  allergic reactions (skin rash, itching or hives; swelling of the face, lips, or tongue)  confusion  kidney injury (trouble passing urine or change in the amount of urine)  light-colored stool  liver injury (dark yellow or brown urine; general ill feeling or flu-like symptoms; loss of appetite, right upper belly pain; unusually weak or tired, yellowing of the eyes or skin)  low adrenal gland function (nausea; vomiting; loss of appetite; unusually weak or tired; dizziness; low blood pressure)  low blood pressure (dizziness; feeling faint or lightheaded, falls; unusually weak or tired)  redness, blistering, peeling, or loosening of the skin, including inside the mouth  serotonin syndrome (irritable; confusion; diarrhea; fast or irregular heartbeat; muscle twitching; stiff muscles; trouble walking; sweating; high fever; seizures; chills; vomiting)  trouble breathing Side effects that usually do not require  medical attention (report to your doctor or health care professional if they continue or are bothersome):  constipation  dry mouth  nausea, vomiting  tiredness This list may not describe all possible side effects. Call your doctor for medical advice about side effects. You may report side effects to FDA at 1-800-FDA-1088. Where should I keep my medicine? Keep out of the reach of children and pets. This medicine can be abused. Keep it in a safe place to protect it from theft. Do not share it with anyone. It is only for you. Selling or giving away this medicine is dangerous and against the law. Store at room temperature between 20 and 25 degrees C (68 and 77 degrees F). Protect from light. Get rid of any unused medicine after the expiration date. This medicine may cause harm and death if it is taken by other adults, children, or pets. It is important to get rid of the medicine as soon as you no longer need it or it is expired. You can do this in two ways:  Take the medicine to a medicine take-back program. Check with your pharmacy or law enforcement to find a location.  If you cannot return the medicine, flush it down the toilet. NOTE: This sheet is a summary. It may not cover all possible information. If you have questions about this medicine, talk to your doctor, pharmacist, or health care provider.  2021 Elsevier/Gold Standard (2019-10-27 11:12:15)

## 2020-05-04 NOTE — Anesthesia Postprocedure Evaluation (Signed)
Anesthesia Post Note  Patient: Travis Blair  Procedure(s) Performed: LEFT ARM ARTERIOVENOUS (AV) FISTULA CREATION (Left )  Patient location during evaluation: PACU Anesthesia Type: General Level of consciousness: awake Pain management: pain level controlled Vital Signs Assessment: post-procedure vital signs reviewed and stable Respiratory status: spontaneous breathing and respiratory function stable Cardiovascular status: blood pressure returned to baseline and stable Postop Assessment: no headache and no apparent nausea or vomiting Anesthetic complications: no   No complications documented.   Last Vitals:  Vitals:   05/04/20 1115 05/04/20 1129  BP: (!) 153/70 (!) 154/73  Pulse: 63 72  Resp: (!) 31 18  Temp:  36.6 C  SpO2: 95% 94%    Last Pain:  Vitals:   05/04/20 1129  TempSrc: Oral  PainSc: 0-No pain                 Louann Sjogren

## 2020-05-04 NOTE — Interval H&P Note (Signed)
History and Physical Interval Note:  05/04/2020 8:58 AM  Travis Blair  has presented today for surgery, with the diagnosis of ESRD.  The various methods of treatment have been discussed with the patient and family. After consideration of risks, benefits and other options for treatment, the patient has consented to  Procedure(s): LEFT ARM ARTERIOVENOUS (AV) FISTULA CREATION VERSUS GRAFT PLACEMENT (Left) as a surgical intervention.  The patient's history has been reviewed, patient examined, no change in status, stable for surgery.  I have reviewed the patient's chart and labs.  Questions were answered to the patient's satisfaction.     Curt Jews

## 2020-05-04 NOTE — Anesthesia Preprocedure Evaluation (Addendum)
Anesthesia Evaluation  Patient identified by MRN, date of birth, ID band Patient awake    Reviewed: Allergy & Precautions, H&P , NPO status , Patient's Chart, lab work & pertinent test results, reviewed documented beta blocker date and time   Airway Mallampati: II  TM Distance: >3 FB Neck ROM: full    Dental no notable dental hx.    Pulmonary asthma ,    Pulmonary exam normal breath sounds clear to auscultation       Cardiovascular Exercise Tolerance: Good hypertension, + CAD, + Past MI, + CABG and +CHF  + dysrhythmias Atrial Fibrillation  Rhythm:irregular Rate:Normal     Neuro/Psych  Neuromuscular disease negative psych ROS   GI/Hepatic negative GI ROS, Neg liver ROS,   Endo/Other  negative endocrine ROSdiabetes, Type 2  Renal/GU ESRF and CRFRenal disease  negative genitourinary   Musculoskeletal   Abdominal   Peds  Hematology  (+) Blood dyscrasia, anemia ,   Anesthesia Other Findings   Reproductive/Obstetrics negative OB ROS                            Anesthesia Physical Anesthesia Plan  ASA: III  Anesthesia Plan: General   Post-op Pain Management:    Induction:   PONV Risk Score and Plan:   Airway Management Planned:   Additional Equipment:   Intra-op Plan:   Post-operative Plan:   Informed Consent: I have reviewed the patients History and Physical, chart, labs and discussed the procedure including the risks, benefits and alternatives for the proposed anesthesia with the patient or authorized representative who has indicated his/her understanding and acceptance.     Dental Advisory Given  Plan Discussed with: CRNA  Anesthesia Plan Comments:         Anesthesia Quick Evaluation

## 2020-05-04 NOTE — Transfer of Care (Signed)
Immediate Anesthesia Transfer of Care Note  Patient: Macari Zalesky Wynns  Procedure(s) Performed: LEFT ARM ARTERIOVENOUS (AV) FISTULA CREATION (Left )  Patient Location: PACU  Anesthesia Type:General  Level of Consciousness: awake, alert  and patient cooperative  Airway & Oxygen Therapy: Patient Spontanous Breathing and Patient connected to nasal cannula oxygen  Post-op Assessment: Report given to RN and Post -op Vital signs reviewed and stable  Post vital signs: Reviewed and stable  Last Vitals:  Vitals Value Taken Time  BP    Temp 98.1   Pulse 66 05/04/20 1055  Resp 22 05/04/20 1055  SpO2 95 % 05/04/20 1055  Vitals shown include unvalidated device data.  Last Pain:  Vitals:   05/04/20 0843  TempSrc: Oral  PainSc: 0-No pain      Patients Stated Pain Goal: 7 (62/[id number]/38 [id number])  Complications: No complications documented.

## 2020-05-05 ENCOUNTER — Encounter (HOSPITAL_COMMUNITY): Payer: Self-pay | Admitting: Vascular Surgery

## 2020-05-11 ENCOUNTER — Other Ambulatory Visit: Payer: Self-pay

## 2020-05-11 ENCOUNTER — Other Ambulatory Visit: Payer: PPO

## 2020-05-11 DIAGNOSIS — N184 Chronic kidney disease, stage 4 (severe): Secondary | ICD-10-CM | POA: Diagnosis not present

## 2020-05-11 DIAGNOSIS — M7989 Other specified soft tissue disorders: Secondary | ICD-10-CM

## 2020-05-11 LAB — BASIC METABOLIC PANEL WITH GFR
BUN/Creatinine Ratio: 18 (calc) (ref 6–22)
BUN: 59 mg/dL — ABNORMAL HIGH (ref 7–25)
CO2: 25 mmol/L (ref 20–32)
Calcium: 8 mg/dL — ABNORMAL LOW (ref 8.6–10.3)
Chloride: 103 mmol/L (ref 98–110)
Creat: 3.25 mg/dL — ABNORMAL HIGH (ref 0.70–1.25)
GFR, Est African American: 21 mL/min/{1.73_m2} — ABNORMAL LOW (ref >=60)
GFR, Est Non African American: 19 mL/min/{1.73_m2} — ABNORMAL LOW (ref >=60)
Glucose, Bld: 140 mg/dL — ABNORMAL HIGH (ref 65–99)
Potassium: 4.7 mmol/L (ref 3.5–5.3)
Sodium: 139 mmol/L (ref 135–146)

## 2020-05-15 ENCOUNTER — Ambulatory Visit (INDEPENDENT_AMBULATORY_CARE_PROVIDER_SITE_OTHER): Payer: PPO | Admitting: Ophthalmology

## 2020-05-15 ENCOUNTER — Other Ambulatory Visit: Payer: Self-pay

## 2020-05-15 ENCOUNTER — Encounter (INDEPENDENT_AMBULATORY_CARE_PROVIDER_SITE_OTHER): Payer: Self-pay | Admitting: Ophthalmology

## 2020-05-15 DIAGNOSIS — G4733 Obstructive sleep apnea (adult) (pediatric): Secondary | ICD-10-CM | POA: Diagnosis not present

## 2020-05-15 DIAGNOSIS — E113411 Type 2 diabetes mellitus with severe nonproliferative diabetic retinopathy with macular edema, right eye: Secondary | ICD-10-CM | POA: Diagnosis not present

## 2020-05-15 DIAGNOSIS — E113412 Type 2 diabetes mellitus with severe nonproliferative diabetic retinopathy with macular edema, left eye: Secondary | ICD-10-CM | POA: Diagnosis not present

## 2020-05-15 DIAGNOSIS — H35033 Hypertensive retinopathy, bilateral: Secondary | ICD-10-CM

## 2020-05-15 MED ORDER — BEVACIZUMAB 2.5 MG/0.1ML IZ SOSY
2.5000 mg | PREFILLED_SYRINGE | INTRAVITREAL | Status: AC | PRN
  Administered 2020-05-15: 2.5 mg via INTRAVITREAL

## 2020-05-15 NOTE — Assessment & Plan Note (Signed)
Numerous pericapillary posterior pole cotton-wool spots continue which are highly suggestive of super imposed hypertensive retinopathy upon severe diabetic retinopathy.  I explained the patient that normalization of the blood pressure is critical to prevent progression of diabetic retinopathy as the cotton-wool spots do not repair themselves and lead to significant risk for neovascular disease, of the retina

## 2020-05-15 NOTE — Assessment & Plan Note (Signed)
Improving macular edema left eye as well currently 9-week follow-up follow-up as scheduled

## 2020-05-15 NOTE — Assessment & Plan Note (Addendum)
Overall the CSME component of disease is improving on Avastin.  Currently at 8-week follow-up interval right

## 2020-05-15 NOTE — Progress Notes (Signed)
05/15/2020     CHIEF COMPLAINT Patient presents for Retina Follow Up (8 Week NPDR f\u OD. Possible Avastin OD. OCT/Pt states vision has been stable. Denies new floaters and FOL./BGL: 140)   HISTORY OF PRESENT ILLNESS: Travis Blair is a 70 y.o. male who presents to the clinic today for:   HPI    Retina Follow Up    Patient presents with  Diabetic Retinopathy.  In right eye.  Severity is severe.  Duration of 8 weeks.  Since onset it is stable.  I, the attending physician,  performed the HPI with the patient and updated documentation appropriately. Additional comments: 8 Week NPDR f\u OD. Possible Avastin OD. OCT Pt states vision has been stable. Denies new floaters and FOL. BGL: 140       Last edited by Tilda Franco on 05/15/2020  1:16 PM. (History)      Referring physician: [name], MD [address] [place],  [place] [address]  HISTORICAL INFORMATION:   Selected notes from the MEDICAL RECORD NUMBER    Lab Results  Component Value Date   HGBA1C 6.3 (H) 01/27/2020     CURRENT MEDICATIONS: No current outpatient medications on file. (Ophthalmic Drugs)   No current facility-administered medications for this visit. (Ophthalmic Drugs)   Current Outpatient Medications (Other)  Medication Sig  . albuterol (VENTOLIN HFA) 108 (90 Base) MCG/ACT inhaler Inhale 1-2 puffs into the lungs every 6 (six) hours as needed for wheezing or shortness of breath.  Marland Kitchen aspirin EC 81 MG tablet Take 81 mg by mouth daily. Swallow whole.  . carvedilol (COREG) 6.25 MG tablet TAKE 1 TABLET (6.25 MG TOTAL) BY MOUTH 2 (TWO) TIMES DAILY WITH A MEAL.  Marland Kitchen ELIQUIS 5 MG TABS tablet TAKE 1 TABLET BY MOUTH TWICE A DAY  . ferrous sulfate (FERROUSUL) 325 (65 FE) MG tablet Take 1 tablet (325 mg total) by mouth daily with breakfast.  . fluticasone (FLONASE) 50 MCG/ACT nasal spray Place 2 sprays into both nostrils daily.  . Multiple Vitamins-Minerals (CENTRUM SILVER 50+MEN PO) Take 1 tablet by  mouth daily.  Marland Kitchen oxyCODONE-acetaminophen (PERCOCET) 5-325 MG tablet Take 1 tablet by mouth every 6 (six) hours as needed for severe pain.  Marland Kitchen REPATHA SURECLICK 562 MG/ML SOAJ INJECT 1 PEN INTO THE SKIN EVERY 14 (FOURTEEN) DAYS.  Marland Kitchen torsemide (DEMADEX) 20 MG tablet Take 2 tablets (40 mg total) by mouth daily.   No current facility-administered medications for this visit. (Other)      REVIEW OF SYSTEMS: ROS    Positive for: Endocrine   Last edited by Tilda Franco on 05/15/2020  1:16 PM. (History)       ALLERGIES Allergies  Allergen Reactions  . Lipitor [Atorvastatin]     Leg aches     PAST MEDICAL HISTORY Past Medical History:  Diagnosis Date  . Anemia associated with chronic renal failure basically  . Asthma    as a child  . CHF (congestive heart failure) ([place])   . Chronic kidney disease    Sees Dr [name]  . Constipation   . Coronary artery disease   . Diabetes mellitus without complication ([place])   . DVT (deep venous thrombosis) ([place])   . History of blood clots   . Hyperlipidemia   . Hypertension   . Leg pain    ABIs/LE Arterial US 03/2019: ABIs unreliable; +calcification, no evidence of stenosis  . Myocardial infarction ([place])   . Obesity   . Pneumonia  2018  . Pulmonary embolism ([place])   . Sleep apnea    CPAP  . SOB (shortness of breath)   . Swelling of both lower extremities    Past Surgical History:  Procedure Laterality Date  . AV FISTULA PLACEMENT Left 05/04/2020   Procedure: LEFT ARM ARTERIOVENOUS (AV) FISTULA CREATION;  Surgeon: Rosetta Posner, MD;  Location: [facility];  Service: Vascular;  Laterality: Left;  . COLONOSCOPY    . CORONARY ARTERY BYPASS GRAFT N/A 03/07/2017   Procedure: CORONARY ARTERY BYPASS GRAFTING (CABG), X 3 , USING RIGHT INTERNAL MAMMARY ARTERY, AND RIGHT LEG GREATER SAPHENOUS VEIN HARVESTED ENDOSCOPICALLY;  Surgeon: [name], MD;  Location: [facility];  Service: Open Heart Surgery;  Laterality: N/A;  . HAND SURGERY Right 1991  .  RIGHT/LEFT HEART CATH AND CORONARY ANGIOGRAPHY N/A 12/20/2016   Procedure: RIGHT/LEFT HEART CATH AND CORONARY ANGIOGRAPHY;  Surgeon: [name], MD;  Location: [facility];  Service: Cardiovascular;  Laterality: N/A;  . TEE WITHOUT CARDIOVERSION N/A 03/07/2017   Procedure: TRANSESOPHAGEAL ECHOCARDIOGRAM (TEE);  Surgeon: [name], [name], MD;  Location: [facility];  Service: Open Heart Surgery;  Laterality: N/A;  . TONSILLECTOMY      FAMILY HISTORY Family History  Problem Relation Age of Onset  . Heart disease Mother   . Diabetes Mother   . Multiple myeloma Mother   . Cancer Father        type unknown, as a child  . Heart disease Father   . Hypertension Father   . Congenital heart disease Father   . Hyperlipidemia Father   . Lymphoma Sister     SOCIAL HISTORY Social History   Tobacco Use  . Smoking status: Never Smoker  . Smokeless tobacco: Never Used  Vaping Use  . Vaping Use: Never used  Substance Use Topics  . Alcohol use: No  . Drug use: No         OPHTHALMIC EXAM:  Base Eye Exam    Visual Acuity (Snellen - Linear)      Right Left   Dist cc 20/20 -2 20/20 -1   Correction: Glasses       Tonometry (Tonopen, 1:20 PM)      Right Left   Pressure 15 17       Pupils      Pupils Dark Light Shape React APD   Right PERRL 5 4 Round Slow None   Left PERRL 5 4 Round Slow None       Visual Fields (Counting fingers)      Left Right    Full Full       Neuro/Psych    Oriented x3: Yes   Mood/Affect: Normal       Dilation    Right eye: 1.0% Mydriacyl, 2.5% Phenylephrine @ 1:20 PM        Slit Lamp and Fundus Exam    External Exam      Right Left   External Normal Normal       Slit Lamp Exam      Right Left   Lids/Lashes Normal Normal   Conjunctiva/Sclera White and quiet White and quiet   Cornea Clear Clear   Anterior Chamber Deep and quiet Deep and quiet   Iris Round and reactive Round and reactive   Lens 2+ Nuclear sclerosis 2+ Nuclear  sclerosis   Anterior Vitreous Normal Normal       Fundus Exam      Right Left   Posterior  Vitreous Normal    Disc Normal    C/D Ratio 0.25    Macula Macular thickening, Microaneurysms, Mild clinically significant macular edema    Vessels NPDR-Severe, with severe retinal nonperfusion capillary dropout with glassy opacification of the peripheral retina    Periphery Cotton wool spots           IMAGING AND PROCEDURES  Imaging and Procedures for 05/15/20  OCT, Retina - OU - Both Eyes       Right Eye Central Foveal Thickness: 291. Progression has improved. Findings include vitreomacular adhesion , cystoid macular edema.   Left Eye Central Foveal Thickness: 263. Progression has improved. Findings include vitreomacular adhesion , cystoid macular edema.   Notes Vastly improved CSME OS still active.  Yet slightly improved 9 week post recent Avastin  CSME OD improved on intravitreal Avastin, currently at 8-week follow-up.  We will repeat injection today and reexamine the right IN 8 weeks       Intravitreal Injection, Pharmacologic Agent - OD - Right Eye       Time Out 05/15/2020. 2:11 PM. Confirmed correct patient, procedure, site, and patient consented.   Anesthesia Topical anesthesia was used. Anesthetic medications included Akten 3.5%.   Procedure Preparation included Ofloxacin , 5% betadine to ocular surface, 10% betadine to eyelids. A 30 gauge needle was used.   Injection:  2.5 mg Bevacizumab (AVASTIN) 2.5mg /0.47mL SOSY   NDC: 38333-832-91, Lot: [id number]   Route: Intravitreal, Site: Right Eye  Post-op Post injection exam found visual acuity of at least counting fingers. The patient tolerated the procedure well. There were no complications. The patient received written and verbal post procedure care education. Post injection medications were not given.                 ASSESSMENT/PLAN:  Hypertensive retinopathy of both eyes Numerous pericapillary posterior  pole cotton-wool spots continue which are highly suggestive of super imposed hypertensive retinopathy upon severe diabetic retinopathy.  I explained the patient that normalization of the blood pressure is critical to prevent progression of diabetic retinopathy as the cotton-wool spots do not repair themselves and lead to significant risk for neovascular disease, of the retina  Severe nonproliferative diabetic retinopathy of right eye, with macular edema, associated with type 2 diabetes mellitus ([place]) Overall the CSME component of disease is improving on Avastin.  Currently at 8-week follow-up interval right  Severe nonproliferative diabetic retinopathy of left eye, with macular edema, associated with type 2 diabetes mellitus ([place]) Improving macular edema left eye as well currently 9-week follow-up follow-up as scheduled  Sleep apnea Confirmation of compliance and "green :-)" on the machine      ICD-10-CM   1. Severe nonproliferative diabetic retinopathy of right eye, with macular edema, associated with type 2 diabetes mellitus (HCC)  E11.3411 OCT, Retina - OU - Both Eyes    Intravitreal Injection, Pharmacologic Agent - OD - Right Eye    bevacizumab (AVASTIN) SOSY 2.5 mg  2. Hypertensive retinopathy of both eyes  H35.033   3. Severe nonproliferative diabetic retinopathy of left eye, with macular edema, associated with type 2 diabetes mellitus ([place])  O45.9977   4. Obstructive sleep apnea syndrome  G47.33     1.  Improved but persistent CSME OS currently at 9-week interval.  We will repeat injection left eye in the near future and may consider focal laser treatment superior nasal and inferonasal to the fovea for long-term control of CSME and diminished risks from ongoing Avastin usage  2.  OD, vastly improved CSME today at 8-week follow-up.  We will repeat injection today as scheduled, and follow-up again in 8 weeks OD  3.  Hypertensive retinopathy findings are persistent in the right eye  patient confirms poor control of blood pressure currently.  He and I discussed that sleep apnea untreated can contribute to this yet he continues to report excellent compliance and excellent machine feedback with its use of CPAP  Patient is scheduled to follow-up with his primary care doctor regarding enhance blood pressure monitoring and control Ophthalmic Meds Ordered this visit:  Meds ordered this encounter  Medications  . bevacizumab (AVASTIN) SOSY 2.5 mg       Return in about 1 week (around 05/22/2020) for dilate, OS, AVASTIN OCT.  There are no Patient Instructions on file for this visit.   Explained the diagnoses, plan, and follow up with the patient and they expressed understanding.  Patient expressed understanding of the importance of proper follow up care.   Clent Demark Kamoria Lucien M.D. Diseases & Surgery of the Retina and Vitreous [facility]ina [facility] Diabetic [place] 05/15/20     Abbreviations: M myopia (nearsighted); A astigmatism; H hyperopia (farsighted); P presbyopia; Mrx spectacle prescription;  CTL contact lenses; OD right eye; OS left eye; OU both eyes  XT exotropia; ET esotropia; PEK punctate epithelial keratitis; PEE punctate epithelial erosions; DES dry eye syndrome; MGD meibomian gland dysfunction; ATs artificial tears; PFAT's preservative free artificial tears; [place] nuclear sclerotic cataract; PSC posterior subcapsular cataract; ERM epi-retinal membrane; PVD posterior vitreous detachment; RD retinal detachment; DM diabetes mellitus; DR diabetic retinopathy; NPDR non-proliferative diabetic retinopathy; PDR proliferative diabetic retinopathy; CSME clinically significant macular edema; DME diabetic macular edema; dbh dot blot hemorrhages; CWS cotton wool spot; POAG primary open angle glaucoma; C/D cup-to-disc ratio; HVF humphrey visual field; GVF goldmann visual field; OCT optical coherence tomography; IOP intraocular pressure; BRVO Branch retinal vein occlusion; CRVO central retinal  vein occlusion; CRAO central retinal artery occlusion; BRAO branch retinal artery occlusion; RT retinal tear; SB scleral buckle; PPV pars plana vitrectomy; VH Vitreous hemorrhage; PRP panretinal laser photocoagulation; IVK intravitreal kenalog; VMT vitreomacular traction; MH Macular hole;  NVD neovascularization of the disc; NVE neovascularization elsewhere; AREDS age related eye disease study; ARMD age related macular degeneration; POAG primary open angle glaucoma; EBMD epithelial/anterior basement membrane dystrophy; ACIOL anterior chamber intraocular lens; IOL intraocular lens; PCIOL posterior chamber intraocular lens; Phaco/IOL phacoemulsification with intraocular lens placement; [place] photorefractive keratectomy; LASIK laser assisted in situ keratomileusis; HTN hypertension; DM diabetes mellitus; COPD chronic obstructive pulmonary disease

## 2020-05-15 NOTE — Assessment & Plan Note (Signed)
Confirmation of compliance and "green :-)" on the machine

## 2020-05-16 ENCOUNTER — Other Ambulatory Visit (HOSPITAL_COMMUNITY): Payer: Self-pay | Admitting: *Deleted

## 2020-05-16 ENCOUNTER — Encounter (HOSPITAL_COMMUNITY)
Admission: RE | Admit: 2020-05-16 | Discharge: 2020-05-16 | Disposition: A | Discharge status: Home / Self Care | Payer: PPO | Source: Ambulatory Visit | Attending: Nephrology | Admitting: Nephrology

## 2020-05-16 VITALS — BP 155/77 | HR 127 | Temp 97.3°F | Resp 18

## 2020-05-16 DIAGNOSIS — N2581 Secondary hyperparathyroidism of renal origin: Secondary | ICD-10-CM | POA: Diagnosis not present

## 2020-05-16 DIAGNOSIS — N184 Chronic kidney disease, stage 4 (severe): Secondary | ICD-10-CM | POA: Diagnosis not present

## 2020-05-16 DIAGNOSIS — N179 Acute kidney failure, unspecified: Secondary | ICD-10-CM | POA: Insufficient documentation

## 2020-05-16 DIAGNOSIS — R053 Chronic cough: Secondary | ICD-10-CM | POA: Diagnosis not present

## 2020-05-16 DIAGNOSIS — E785 Hyperlipidemia, unspecified: Secondary | ICD-10-CM | POA: Diagnosis not present

## 2020-05-16 DIAGNOSIS — E1122 Type 2 diabetes mellitus with diabetic chronic kidney disease: Secondary | ICD-10-CM | POA: Diagnosis not present

## 2020-05-16 DIAGNOSIS — I2510 Atherosclerotic heart disease of native coronary artery without angina pectoris: Secondary | ICD-10-CM | POA: Diagnosis not present

## 2020-05-16 LAB — POCT HEMOGLOBIN-HEMACUE: Hemoglobin: 10.4 g/dL — ABNORMAL LOW (ref 13.0–17.0)

## 2020-05-16 MED ORDER — EPOETIN ALFA-EPBX 10000 UNIT/ML IJ SOLN
INTRAMUSCULAR | Status: AC
  Administered 2020-05-16: 20000 [IU] via SUBCUTANEOUS
  Filled 2020-05-16: qty 2

## 2020-05-16 MED ORDER — EPOETIN ALFA-EPBX 10000 UNIT/ML IJ SOLN
20000.0000 [IU] | INTRAMUSCULAR | Status: DC
Start: 2020-05-16 — End: 2020-05-17

## 2020-05-19 ENCOUNTER — Ambulatory Visit (INDEPENDENT_AMBULATORY_CARE_PROVIDER_SITE_OTHER): Payer: PPO | Admitting: Family Medicine

## 2020-05-19 ENCOUNTER — Encounter: Payer: Self-pay | Admitting: Family Medicine

## 2020-05-19 ENCOUNTER — Other Ambulatory Visit: Payer: Self-pay

## 2020-05-19 VITALS — BP 146/82 | HR 74 | Temp 98.1°F | Resp 18 | Ht 71.5 in | Wt 307.0 lb

## 2020-05-19 DIAGNOSIS — M7989 Other specified soft tissue disorders: Secondary | ICD-10-CM | POA: Diagnosis not present

## 2020-05-19 DIAGNOSIS — N184 Chronic kidney disease, stage 4 (severe): Secondary | ICD-10-CM | POA: Diagnosis not present

## 2020-05-19 MED ORDER — METOLAZONE 5 MG PO TABS: 5.0000 mg | ORAL_TABLET | Freq: Every day | ORAL | 1 refills | Status: DC

## 2020-05-19 NOTE — Progress Notes (Signed)
Subjective:    Patient ID: Travis Blair, male    DOB: October 19, 1950, 71 y.o.   MRN: [id number]  HPI  05/01/20 Patient is being seen today as a telephone visit.  Patient is currently at home.  I am currently in my office.  He consents to be seen via telephone.  Phone call began at 35.  Phone call concluded at 1110.  Patient states that his wife had a cold last week.  She is now better.  She took a negative Covid test last week at home.  Saturday night he went out to eat dinner and was out on the patio.  The patio was covered in pollen.  As soon as he ate dinner and had been in the pollen all day, he started developing runny nose and sinus pressure.  Has had a runny nose with sinus pressure since Saturday.  Today is day 2.  He denies any sinus pain.  He denies any headaches.  He reports significant postnasal drainage however.  He states that his throat is scratchy due to the drainage.  He also reports a dry nonproductive cough.  He denies any fevers or chills.  He denies any shortness of breath.  He denies any chest pain.  Past medical history significant for heart disease, diabetes, and chronic kidney disease.  At that time, my plan was: Symptoms sound like allergies or he potentially could have caught an upper respiratory infection from his wife that sounds viral.  He took a home COVID test this morning which was negative.  I have recommended trying Xyzal 5 mg a day coupled with Flonase 2 sprays each nostril daily.  If he develops fever or pain or shortness of breath he needs to be seen immediately however I suspect that this is most likely either a self-limited virus or potentially allergies.  Patient then came by and wanted me to see a lesion on his right shin.  There is weeping edema coming from 2 small sores on his anterior right shin.  Both sores are very shallow and are 1 cm in diameter however he has +2 pitting edema all the way up to his knees bilaterally.  The legs are tense and swollen.  He  is taking torsemide 20 mg a day but he states it does not seem to be working.  Therefore I placed the patient in Unna boots bilaterally to help with third spacing and started him on torsemide 40 mg a day with a plan to recheck the patient in 48 hours to try to diurese 5 pounds of swelling.  05/03/20 Wt Readings from Last 3 Encounters:  05/03/20 (!) 308 lb (139.7 kg)  04/28/20 (!) 310 lb (140.6 kg)  04/17/20 (!) 313 lb (142 kg)  Patient has lost a little bit of weight since I saw him.  We removed his lipids today.  The swelling in his legs is better although he still has +1 edema up to his knees.  His blood pressure has improved considerably since increasing his torsemide.  Clinically he still appears to have right-sided heart failure.  His echocardiogram in November showed severe pulmonary hypertension.  This was obviously after he had a pulmonary embolism and the thought was that the PE was the most likely cause of the pulmonary hypertension.  He has not had a repeat echocardiogram that I can see.  He has now completed 4 months of anticoagulation.  He states that he feels better on the higher dose of the diuretic and  his breathing is better.  Therefore as long as his renal function tolerated I had like to leave him on torsemide 40 mg a day.  His creatinine when I checked it 2 days ago was 3.22.  He continues to have petechia and purpura on both shins.  I believe this is due to a combination of his anticoagulation, renal induced platelet disorder, and leg swelling. AT that time, my plan was: The edema in the patient's legs has improved.  I believe that he is doing a better job on torsemide 40 mg a day.  He is asking for refill.  I plan to keep him on 40 mg a day assuming that his renal function will tolerate it.  Therefore I would like to check a BMP today to recheck his creatinine.  As long as it is stable we will leave him on torsemide 40 mg a day.  However he has the classic signs of right-sided heart  failure.  I would like to repeat an echocardiogram to see if there is still pulmonary hypertension despite being treated with anticoagulation for his pulmonary embolism for more than 4 months.  If he has pulmonary hypertension still, he may benefit from Cialis, 10 to 20 mg a day which may help treat the right-sided heart failure.  This would also improve his leg swelling.  I also recommended the patient discontinue the Unna boots and replace them with compression stockings, 15 to 20 mmHg daily.  05/19/20  Patient is here today.  His legs are still extremely swollen and weeping.  Specifically his left leg is weeping serous fluid from numerous small sores that are formed on the anterior left shin.  He has +2 pitting edema all the way up to his knees.  His renal doctor recently increased his torsemide to 80.  Therefore it has doubled over the last week but there has been no significant improvement. Past Medical History:  Diagnosis Date  . Anemia associated with chronic renal failure basically  . Asthma    as a child  . CHF (congestive heart failure) ([place])   . Chronic kidney disease    Sees Dr [name]  . Constipation   . Coronary artery disease   . Diabetes mellitus without complication ([place])   . DVT (deep venous thrombosis) ([place])   . History of blood clots   . Hyperlipidemia   . Hypertension   . Leg pain    ABIs/LE Arterial US 03/2019: ABIs unreliable; +calcification, no evidence of stenosis  . Myocardial infarction ([place])   . Obesity   . Pneumonia 2018  . Pulmonary embolism ([facility])   . Sleep apnea    CPAP  . SOB (shortness of breath)   . Swelling of both lower extremities    Past Surgical History:  Procedure Laterality Date  . AV FISTULA PLACEMENT Left 05/04/2020   Procedure: LEFT ARM ARTERIOVENOUS (AV) FISTULA CREATION;  Surgeon: Rosetta Posner, MD;  Location: [facility];  Service: Vascular;  Laterality: Left;  . COLONOSCOPY    . CORONARY ARTERY BYPASS GRAFT N/A 03/07/2017   Procedure: CORONARY  ARTERY BYPASS GRAFTING (CABG), X 3 , USING RIGHT INTERNAL MAMMARY ARTERY, AND RIGHT LEG GREATER SAPHENOUS VEIN HARVESTED ENDOSCOPICALLY;  Surgeon: [name], MD;  Location: [facility];  Service: Open Heart Surgery;  Laterality: N/A;  . HAND SURGERY Right 1991  . RIGHT/LEFT HEART CATH AND CORONARY ANGIOGRAPHY N/A 12/20/2016   Procedure: RIGHT/LEFT HEART CATH AND CORONARY ANGIOGRAPHY;  Surgeon: [name], MD;  Location: [facility];  Service: Cardiovascular;  Laterality: N/A;  . TEE WITHOUT CARDIOVERSION N/A 03/07/2017   Procedure: TRANSESOPHAGEAL ECHOCARDIOGRAM (TEE);  Surgeon: [name], [name], MD;  Location: [facility];  Service: Open Heart Surgery;  Laterality: N/A;  . TONSILLECTOMY     Current Outpatient Medications on File Prior to Visit  Medication Sig Dispense Refill  . albuterol (VENTOLIN HFA) 108 (90 Base) MCG/ACT inhaler Inhale 1-2 puffs into the lungs every 6 (six) hours as needed for wheezing or shortness of breath.    Marland Kitchen aspirin EC 81 MG tablet Take 81 mg by mouth daily. Swallow whole.    . carvedilol (COREG) 6.25 MG tablet TAKE 1 TABLET (6.25 MG TOTAL) BY MOUTH 2 (TWO) TIMES DAILY WITH A MEAL. 180 tablet 1  . ELIQUIS 5 MG TABS tablet TAKE 1 TABLET BY MOUTH TWICE A DAY 60 tablet 5  . ferrous sulfate (FERROUSUL) 325 (65 FE) MG tablet Take 1 tablet (325 mg total) by mouth daily with breakfast.  3  . fluticasone (FLONASE) 50 MCG/ACT nasal spray Place 2 sprays into both nostrils daily. 16 g 6  . Multiple Vitamins-Minerals (CENTRUM SILVER 50+MEN PO) Take 1 tablet by mouth daily.    Marland Kitchen oxyCODONE-acetaminophen (PERCOCET) 5-325 MG tablet Take 1 tablet by mouth every 6 (six) hours as needed for severe pain. 8 tablet 0  . REPATHA SURECLICK 469 MG/ML SOAJ INJECT 1 PEN INTO THE SKIN EVERY 14 (FOURTEEN) DAYS. 6 mL 3  . torsemide (DEMADEX) 20 MG tablet Take 2 tablets (40 mg total) by mouth daily. 180 tablet 3   No current facility-administered medications on file prior to visit.    Allergies  Allergen Reactions  . Lipitor [Atorvastatin]     Leg aches    Social History   Socioeconomic History  . Marital status: Married    Spouse name: Not on file  . Number of children: 2  . Years of education: Not on file  . Highest education level: Not on file  Occupational History  . Occupation: general contractor-retired  Tobacco Use  . Smoking status: Never Smoker  . Smokeless tobacco: Never Used  Vaping Use  . Vaping Use: Never used  Substance and Sexual Activity  . Alcohol use: No  . Drug use: No  . Sexual activity: Never  Other Topics Concern  . Not on file  Social History Narrative  . Not on file   Social Determinants of Health   Financial Resource Strain: Not on file  Food Insecurity: Not on file  Transportation Needs: Not on file  Physical Activity: Not on file  Stress: Not on file  Social Connections: Not on file  Intimate Partner Violence: Not on file      Review of Systems  All other systems reviewed and are negative.      Objective:   Physical Exam Vitals reviewed.  Constitutional:      General: He is not in acute distress.    Appearance: He is obese. He is not ill-appearing or toxic-appearing.  Cardiovascular:     Rate and Rhythm: Normal rate.     Heart sounds: Normal heart sounds. No murmur heard. No gallop.   Pulmonary:     Effort: Pulmonary effort is normal. No respiratory distress.     Breath sounds: No wheezing or rhonchi.  Musculoskeletal:     Right lower leg: Edema present.     Left lower leg: Edema present.  Neurological:     Mental Status: He is alert.  Assessment & Plan:  Leg swelling  CKD (chronic kidney disease) stage 4, GFR 15-29 ml/min (HCC)  I placed the patient back in an Unna boot on his left leg.  I will try metolazone 5 mg Saturday morning.  30 minutes later I want him to take his 80 mg of torsemide.  We will then reassess on Monday or Tuesday to see how much fluid he diuresed and also  recheck his renal function and his potassium.  I believe that this will also drop his blood pressure.  If we can get the swelling down effectively using this, we may have to do this procedure once a week or so to help control the swelling.

## 2020-05-23 ENCOUNTER — Ambulatory Visit (INDEPENDENT_AMBULATORY_CARE_PROVIDER_SITE_OTHER): Payer: PPO | Admitting: Family Medicine

## 2020-05-23 ENCOUNTER — Encounter: Payer: Self-pay | Admitting: Family Medicine

## 2020-05-23 ENCOUNTER — Other Ambulatory Visit: Payer: Self-pay

## 2020-05-23 ENCOUNTER — Ambulatory Visit (INDEPENDENT_AMBULATORY_CARE_PROVIDER_SITE_OTHER): Payer: PPO | Admitting: Podiatry

## 2020-05-23 VITALS — BP 138/82 | HR 72 | Temp 98.2°F | Resp 16 | Ht 71.5 in | Wt 292.0 lb

## 2020-05-23 DIAGNOSIS — B351 Tinea unguium: Secondary | ICD-10-CM

## 2020-05-23 DIAGNOSIS — M7989 Other specified soft tissue disorders: Secondary | ICD-10-CM

## 2020-05-23 DIAGNOSIS — N184 Chronic kidney disease, stage 4 (severe): Secondary | ICD-10-CM | POA: Diagnosis not present

## 2020-05-23 DIAGNOSIS — E1169 Type 2 diabetes mellitus with other specified complication: Secondary | ICD-10-CM | POA: Diagnosis not present

## 2020-05-23 DIAGNOSIS — E1151 Type 2 diabetes mellitus with diabetic peripheral angiopathy without gangrene: Secondary | ICD-10-CM

## 2020-05-23 MED ORDER — TRIAMCINOLONE ACETONIDE 0.1 % EX CREA: 1.0000 "application " | TOPICAL_CREAM | Freq: Two times a day (BID) | CUTANEOUS | 0 refills | Status: DC

## 2020-05-23 NOTE — Progress Notes (Signed)
Subjective:    Patient ID: Travis Blair, male    DOB: 02-22-1950, 70 y.o.   MRN: [id number]  HPI  05/01/20 Patient is being seen today as a telephone visit.  Patient is currently at home.  I am currently in my office.  He consents to be seen via telephone.  Phone call began at 52.  Phone call concluded at 1110.  Patient states that his wife had a cold last week.  She is now better.  She took a negative Covid test last week at home.  Saturday night he went out to eat dinner and was out on the patio.  The patio was covered in pollen.  As soon as he ate dinner and had been in the pollen all day, he started developing runny nose and sinus pressure.  Has had a runny nose with sinus pressure since Saturday.  Today is day 2.  He denies any sinus pain.  He denies any headaches.  He reports significant postnasal drainage however.  He states that his throat is scratchy due to the drainage.  He also reports a dry nonproductive cough.  He denies any fevers or chills.  He denies any shortness of breath.  He denies any chest pain.  Past medical history significant for heart disease, diabetes, and chronic kidney disease.  At that time, my plan was: Symptoms sound like allergies or he potentially could have caught an upper respiratory infection from his wife that sounds viral.  He took a home COVID test this morning which was negative.  I have recommended trying Xyzal 5 mg a day coupled with Flonase 2 sprays each nostril daily.  If he develops fever or pain or shortness of breath he needs to be seen immediately however I suspect that this is most likely either a self-limited virus or potentially allergies.  Patient then came by and wanted me to see a lesion on his right shin.  There is weeping edema coming from 2 small sores on his anterior right shin.  Both sores are very shallow and are 1 cm in diameter however he has +2 pitting edema all the way up to his knees bilaterally.  The legs are tense and swollen.  He  is taking torsemide 20 mg a day but he states it does not seem to be working.  Therefore I placed the patient in Unna boots bilaterally to help with third spacing and started him on torsemide 40 mg a day with a plan to recheck the patient in 48 hours to try to diurese 5 pounds of swelling.  05/03/20 Wt Readings from Last 3 Encounters:  05/19/20 (!) 307 lb (139.3 kg)  05/03/20 (!) 308 lb (139.7 kg)  04/28/20 (!) 310 lb (140.6 kg)  Patient has lost a little bit of weight since I saw him.  We removed his lipids today.  The swelling in his legs is better although he still has +1 edema up to his knees.  His blood pressure has improved considerably since increasing his torsemide.  Clinically he still appears to have right-sided heart failure.  His echocardiogram in November showed severe pulmonary hypertension.  This was obviously after he had a pulmonary embolism and the thought was that the PE was the most likely cause of the pulmonary hypertension.  He has not had a repeat echocardiogram that I can see.  He has now completed 4 months of anticoagulation.  He states that he feels better on the higher dose of the diuretic and  his breathing is better.  Therefore as long as his renal function tolerated I had like to leave him on torsemide 40 mg a day.  His creatinine when I checked it 2 days ago was 3.22.  He continues to have petechia and purpura on both shins.  I believe this is due to a combination of his anticoagulation, renal induced platelet disorder, and leg swelling. AT that time, my plan was: The edema in the patient's legs has improved.  I believe that he is doing a better job on torsemide 40 mg a day.  He is asking for refill.  I plan to keep him on 40 mg a day assuming that his renal function will tolerate it.  Therefore I would like to check a BMP today to recheck his creatinine.  As long as it is stable we will leave him on torsemide 40 mg a day.  However he has the classic signs of right-sided heart  failure.  I would like to repeat an echocardiogram to see if there is still pulmonary hypertension despite being treated with anticoagulation for his pulmonary embolism for more than 4 months.  If he has pulmonary hypertension still, he may benefit from Cialis, 10 to 20 mg a day which may help treat the right-sided heart failure.  This would also improve his leg swelling.  I also recommended the patient discontinue the Unna boots and replace them with compression stockings, 15 to 20 mmHg daily.  05/19/20  Patient is here today.  His legs are still extremely swollen and weeping.  Specifically his left leg is weeping serous fluid from numerous small sores that are formed on the anterior left shin.  He has +2 pitting edema all the way up to his knees.  His renal doctor recently increased his torsemide to 80.  Therefore it has doubled over the last week but there has been no significant improvement.  At that time, my plan was: I placed the patient back in an Unna boot on his left leg.  I will try metolazone 5 mg Saturday morning.  30 minutes later I want him to take his 80 mg of torsemide.  We will then reassess on Monday or Tuesday to see how much fluid he diuresed and also recheck his renal function and his potassium.  I believe that this will also drop his blood pressure.  If we can get the swelling down effectively using this, we may have to do this procedure once a week or so to help control the swelling.  05/23/20 Wt Readings from Last 3 Encounters:  05/23/20 292 lb (132.5 kg)  05/19/20 (!) 307 lb (139.3 kg)  05/03/20 (!) 308 lb (139.7 kg)   Patient has lost 15 pounds!.  He used the metolazone once.  He is still taking 80 mg of torsemide.  He states that he is having to urinate every 2 hours.  Unna boot is removed today from his left leg:   There is no warmth.  There is no pain.  The irritation and redness appears to be more of an irritant dermatitis due to the wraps and the nonadherent gauze that I  placed over the weeping ulcers.  I explained this to the patient is similar to diaper rash Past Medical History:  Diagnosis Date  . Anemia associated with chronic renal failure basically  . Asthma    as a child  . CHF (congestive heart failure) ([place])   . Chronic kidney disease    Sees Dr [name]  .  Constipation   . Coronary artery disease   . Diabetes mellitus without complication ([place])   . DVT (deep venous thrombosis) ([place])   . History of blood clots   . Hyperlipidemia   . Hypertension   . Leg pain    ABIs/LE Arterial US 03/2019: ABIs unreliable; +calcification, no evidence of stenosis  . Myocardial infarction ([place])   . Obesity   . Pneumonia 2018  . Pulmonary embolism ([place])   . Sleep apnea    CPAP  . SOB (shortness of breath)   . Swelling of both lower extremities    Past Surgical History:  Procedure Laterality Date  . AV FISTULA PLACEMENT Left 05/04/2020   Procedure: LEFT ARM ARTERIOVENOUS (AV) FISTULA CREATION;  Surgeon: Rosetta Posner, MD;  Location: [facility];  Service: Vascular;  Laterality: Left;  . COLONOSCOPY    . CORONARY ARTERY BYPASS GRAFT N/A 03/07/2017   Procedure: CORONARY ARTERY BYPASS GRAFTING (CABG), X 3 , USING RIGHT INTERNAL MAMMARY ARTERY, AND RIGHT LEG GREATER SAPHENOUS VEIN HARVESTED ENDOSCOPICALLY;  Surgeon: [name], MD;  Location: [facility];  Service: Open Heart Surgery;  Laterality: N/A;  . HAND SURGERY Right 1991  . RIGHT/LEFT HEART CATH AND CORONARY ANGIOGRAPHY N/A 12/20/2016   Procedure: RIGHT/LEFT HEART CATH AND CORONARY ANGIOGRAPHY;  Surgeon: [name], MD;  Location: [facility];  Service: Cardiovascular;  Laterality: N/A;  . TEE WITHOUT CARDIOVERSION N/A 03/07/2017   Procedure: TRANSESOPHAGEAL ECHOCARDIOGRAM (TEE);  Surgeon: [name], [name], MD;  Location: [facility];  Service: Open Heart Surgery;  Laterality: N/A;  . TONSILLECTOMY     Current Outpatient Medications on File Prior to Visit  Medication Sig Dispense Refill  . albuterol  (VENTOLIN HFA) 108 (90 Base) MCG/ACT inhaler Inhale 1-2 puffs into the lungs every 6 (six) hours as needed for wheezing or shortness of breath.    Marland Kitchen aspirin EC 81 MG tablet Take 81 mg by mouth daily. Swallow whole.    . carvedilol (COREG) 6.25 MG tablet TAKE 1 TABLET (6.25 MG TOTAL) BY MOUTH 2 (TWO) TIMES DAILY WITH A MEAL. 180 tablet 1  . ELIQUIS 5 MG TABS tablet TAKE 1 TABLET BY MOUTH TWICE A DAY 60 tablet 5  . ferrous sulfate (FERROUSUL) 325 (65 FE) MG tablet Take 1 tablet (325 mg total) by mouth daily with breakfast.  3  . fluticasone (FLONASE) 50 MCG/ACT nasal spray Place 2 sprays into both nostrils daily. 16 g 6  . metolazone (ZAROXOLYN) 5 MG tablet Take 1 tablet (5 mg total) by mouth daily. 30 tablet 1  . Multiple Vitamins-Minerals (CENTRUM SILVER 50+MEN PO) Take 1 tablet by mouth daily.    Marland Kitchen oxyCODONE-acetaminophen (PERCOCET) 5-325 MG tablet Take 1 tablet by mouth every 6 (six) hours as needed for severe pain. 8 tablet 0  . REPATHA SURECLICK 272 MG/ML SOAJ INJECT 1 PEN INTO THE SKIN EVERY 14 (FOURTEEN) DAYS. 6 mL 3  . torsemide (DEMADEX) 20 MG tablet Take 2 tablets (40 mg total) by mouth daily. (Patient taking differently: Take 80 mg by mouth daily.) 180 tablet 3   No current facility-administered medications on file prior to visit.   Allergies  Allergen Reactions  . Lipitor [Atorvastatin]     Leg aches    Social History   Socioeconomic History  . Marital status: Married    Spouse name: Not on file  . Number of children: 2  . Years of education: Not on file  . Highest education level: Not on file  Occupational History  . Occupation: general contractor-retired  Tobacco Use  . Smoking status: Never Smoker  . Smokeless tobacco: Never Used  Vaping Use  . Vaping Use: Never used  Substance and Sexual Activity  . Alcohol use: No  . Drug use: No  . Sexual activity: Never  Other Topics Concern  . Not on file  Social History Narrative  . Not on file   Social Determinants of  Health   Financial Resource Strain: Not on file  Food Insecurity: Not on file  Transportation Needs: Not on file  Physical Activity: Not on file  Stress: Not on file  Social Connections: Not on file  Intimate Partner Violence: Not on file      Review of Systems  All other systems reviewed and are negative.      Objective:   Physical Exam Vitals reviewed.  Constitutional:      General: He is not in acute distress.    Appearance: He is obese. He is not ill-appearing or toxic-appearing.  Cardiovascular:     Rate and Rhythm: Normal rate.     Heart sounds: Normal heart sounds. No murmur heard. No gallop.   Pulmonary:     Effort: Pulmonary effort is normal. No respiratory distress.     Breath sounds: No wheezing or rhonchi.  Musculoskeletal:     Right lower leg: Edema present.     Left lower leg: Edema present.  Neurological:     Mental Status: He is alert.           Assessment & Plan:  CKD (chronic kidney disease) stage 4, GFR 15-29 ml/min (HCC) - Plan: BASIC METABOLIC PANEL WITH GFR  Leg swelling - Plan: BASIC METABOLIC PANEL WITH GFR  Edema has improved dramatically.  Weight is down 15 pounds.  Check renal function.  If creatinine is elevated we will need to reduce his torsemide back to 40 mg a day.  If stable we will continue torsemide.  If his weight gets above 300 pounds, he could do 1 dose of metolazone per week as needed to help improve diuresis.  However we need to be careful to avoid prerenal azotemia.  Use triamcinolone cream to the irritant dermatitis on his left leg twice daily for 1 week.  Begin wearing venous compression hose on both legs.

## 2020-05-23 NOTE — Progress Notes (Signed)
  Subjective:  Patient ID: Travis Blair, male    DOB: 1950/08/18,  MRN: [id number]  Chief Complaint  Patient presents with  . Nail Problem    RFC Nail trim. No changes, questions or concerns.    70 y.o. male presents with the above complaint. History confirmed with patient.   Has left leg wrapped due to draining fluid blisters  Objective:  Physical Exam: warm, good capillary refill, nail exam onychomycosis of the toenails, no trophic changes or ulcerative lesions. DP pulses palpable, PT pulses palpable and protective sensation absent Left Foot: HPK left 5th MPJ, hallux medial, flexible hammertoes. Dressing intact left leg Right Foot: HPK right 5th MPJ, heel, Hammertoe rigid 2nd toe, other digits flexible  No images are attached to the encounter.  Assessment:   1. Onychomycosis of multiple toenails with type 2 diabetes mellitus and peripheral angiopathy ([place])     Plan:  Patient was evaluated and treated and all questions answered.  Onychomycosis, Diabetes and DPN -Patient is diabetic with a qualifying condition for at risk foot care.  Procedure: Nail Debridement Type of Debridement: manual, sharp debridement. Instrumentation: Nail nipper, rotary burr. Number of Nails: 10  Venous leg ulcer legs -Dressed by PCP -Advised I would be happy to manage this if this continues.    Return in about 3 months (around 08/22/2020) for Diabetic Foot Care.

## 2020-05-24 ENCOUNTER — Encounter (INDEPENDENT_AMBULATORY_CARE_PROVIDER_SITE_OTHER): Payer: PPO | Admitting: Ophthalmology

## 2020-05-24 LAB — BASIC METABOLIC PANEL WITH GFR
BUN/Creatinine Ratio: 24 (calc) — ABNORMAL HIGH (ref 6–22)
BUN: 83 mg/dL — ABNORMAL HIGH (ref 7–25)
CO2: 26 mmol/L (ref 20–32)
Calcium: 8.2 mg/dL — ABNORMAL LOW (ref 8.6–10.3)
Chloride: 97 mmol/L — ABNORMAL LOW (ref 98–110)
Creat: 3.49 mg/dL — ABNORMAL HIGH (ref 0.70–1.25)
GFR, Est African American: 20 mL/min/{1.73_m2} — ABNORMAL LOW (ref >=60)
GFR, Est Non African American: 17 mL/min/{1.73_m2} — ABNORMAL LOW (ref >=60)
Glucose, Bld: 182 mg/dL — ABNORMAL HIGH (ref 65–99)
Potassium: 4.6 mmol/L (ref 3.5–5.3)
Sodium: 136 mmol/L (ref 135–146)

## 2020-05-30 ENCOUNTER — Encounter (HOSPITAL_COMMUNITY)
Admission: RE | Admit: 2020-05-30 | Discharge: 2020-05-30 | Disposition: A | Discharge status: Home / Self Care | Payer: PPO | Source: Ambulatory Visit | Attending: Nephrology | Admitting: Nephrology

## 2020-05-30 ENCOUNTER — Other Ambulatory Visit: Payer: Self-pay

## 2020-05-30 VITALS — BP 144/73 | HR 65 | Temp 97.1°F | Resp 18

## 2020-05-30 DIAGNOSIS — N179 Acute kidney failure, unspecified: Secondary | ICD-10-CM | POA: Diagnosis not present

## 2020-05-30 LAB — POCT HEMOGLOBIN-HEMACUE: Hemoglobin: 9 g/dL — ABNORMAL LOW (ref 13.0–17.0)

## 2020-05-30 MED ORDER — EPOETIN ALFA-EPBX 10000 UNIT/ML IJ SOLN
INTRAMUSCULAR | Status: AC
  Filled 2020-05-30: qty 2

## 2020-05-30 MED ORDER — SODIUM CHLORIDE 0.9 % IV SOLN
510.0000 mg | INTRAVENOUS | Status: DC
  Administered 2020-05-30: 510 mg via INTRAVENOUS
  Filled 2020-05-30: qty 17

## 2020-05-30 MED ORDER — EPOETIN ALFA-EPBX 10000 UNIT/ML IJ SOLN
20000.0000 [IU] | INTRAMUSCULAR | Status: DC
Start: 2020-05-30 — End: 2020-05-31
  Administered 2020-05-30: 20000 [IU] via SUBCUTANEOUS

## 2020-06-05 ENCOUNTER — Other Ambulatory Visit: Payer: Self-pay

## 2020-06-05 ENCOUNTER — Encounter: Payer: Self-pay | Admitting: Vascular Surgery

## 2020-06-05 ENCOUNTER — Ambulatory Visit (INDEPENDENT_AMBULATORY_CARE_PROVIDER_SITE_OTHER): Payer: PPO | Admitting: Vascular Surgery

## 2020-06-05 VITALS — BP 163/90 | HR 73 | Temp 97.3°F | Resp 16 | Ht 71.5 in | Wt 299.0 lb

## 2020-06-05 DIAGNOSIS — N184 Chronic kidney disease, stage 4 (severe): Secondary | ICD-10-CM

## 2020-06-05 DIAGNOSIS — I2510 Atherosclerotic heart disease of native coronary artery without angina pectoris: Secondary | ICD-10-CM | POA: Diagnosis not present

## 2020-06-05 DIAGNOSIS — R053 Chronic cough: Secondary | ICD-10-CM | POA: Diagnosis not present

## 2020-06-05 DIAGNOSIS — N189 Chronic kidney disease, unspecified: Secondary | ICD-10-CM | POA: Diagnosis not present

## 2020-06-05 DIAGNOSIS — E785 Hyperlipidemia, unspecified: Secondary | ICD-10-CM | POA: Diagnosis not present

## 2020-06-05 DIAGNOSIS — N2581 Secondary hyperparathyroidism of renal origin: Secondary | ICD-10-CM | POA: Diagnosis not present

## 2020-06-05 DIAGNOSIS — E1122 Type 2 diabetes mellitus with diabetic chronic kidney disease: Secondary | ICD-10-CM | POA: Diagnosis not present

## 2020-06-05 NOTE — Progress Notes (Signed)
[facility] [place]  Patient name: Travis Blair MRN: [id number] DOB: 02-20-1950 Sex: male  REASON FOR VISIT: Follow-up left brachiocephalic AV fistula creation at [facility]  Nephrologist: Dr. [name]  HPI: Travis Blair is a 70 y.o. male here today for follow-up.  Underwent left brachiocephalic AV fistula creation by myself on 05/04/2020.  He reports that he continues to have stable chronic kidney disease and is not on dialysis.  He has had no difficulty with his fistula creation and has no steal symptoms.  Current Outpatient Medications  Medication Sig Dispense Refill  . albuterol (VENTOLIN HFA) 108 (90 Base) MCG/ACT inhaler Inhale 1-2 puffs into the lungs every 6 (six) hours as needed for wheezing or shortness of breath.    Marland Kitchen aspirin EC 81 MG tablet Take 81 mg by mouth daily. Swallow whole.    . carvedilol (COREG) 6.25 MG tablet TAKE 1 TABLET (6.25 MG TOTAL) BY MOUTH 2 (TWO) TIMES DAILY WITH A MEAL. 180 tablet 1  . ELIQUIS 5 MG TABS tablet TAKE 1 TABLET BY MOUTH TWICE A DAY 60 tablet 5  . ferrous sulfate (FERROUSUL) 325 (65 FE) MG tablet Take 1 tablet (325 mg total) by mouth daily with breakfast.  3  . fluticasone (FLONASE) 50 MCG/ACT nasal spray Place 2 sprays into both nostrils daily. 16 g 6  . metolazone (ZAROXOLYN) 5 MG tablet Take 1 tablet (5 mg total) by mouth daily. 30 tablet 1  . Multiple Vitamins-Minerals (CENTRUM SILVER 50+MEN PO) Take 1 tablet by mouth daily.    Marland Kitchen oxyCODONE-acetaminophen (PERCOCET) 5-325 MG tablet Take 1 tablet by mouth every 6 (six) hours as needed for severe pain. 8 tablet 0  . REPATHA SURECLICK 458 MG/ML SOAJ INJECT 1 PEN INTO THE SKIN EVERY 14 (FOURTEEN) DAYS. 6 mL 3  . torsemide (DEMADEX) 20 MG tablet Take 2 tablets (40 mg total) by mouth daily. (Patient taking differently: Take 80 mg by mouth daily.) 180 tablet 3  . triamcinolone cream (KENALOG) 0.1 % Apply 1 application topically  2 (two) times daily. 30 g 0   No current facility-administered medications for this visit.     PHYSICAL EXAM: Vitals:   06/05/20 1130  BP: (!) 163/90  Pulse: 73  Resp: 16  Temp: (!) 97.3 F (36.3 C)  TempSrc: Other (Comment)  SpO2: 97%  Weight: 299 lb (135.6 kg)  Height: 5' 11.5" (1.816 m)    GENERAL: The patient is a well-nourished male, in no acute distress. The vital signs are documented above. Antecubital incision is well-healed.  He does have 1 area of thickening related to the Vicryl suture.  No open area.  He has an excellent thrill and his vein has very nice Rece Zechman maturation by physical exam appears to be 7+ millimeters throughout its course  MEDICAL ISSUES: Stable overall after initial fistula creation 1 month ago.  Will continue his follow-up with Dr. [name].  Will see [place] as needed.  I did explain to the patient that his fistula does run slightly deep to the surface of the skin and that as he approaches dialysis would potentially need superficialization if it is felt to be inadequate for reliable access.  He will see [place] again on an as-needed basis   Rosetta Posner, MD FACS [facility] Tel [phone]  Note: Portions of this report may have been transcribed using voice recognition software.  Every effort has been made to ensure accuracy; however, inadvertent  computerized transcription errors may still be present.

## 2020-06-06 ENCOUNTER — Ambulatory Visit (HOSPITAL_COMMUNITY): Payer: PPO | Attending: Cardiology

## 2020-06-06 ENCOUNTER — Ambulatory Visit
Admission: RE | Admit: 2020-06-06 | Discharge: 2020-06-06 | Disposition: A | Discharge status: Home / Self Care | Payer: PPO | Source: Ambulatory Visit | Attending: Nephrology | Admitting: Nephrology

## 2020-06-06 ENCOUNTER — Encounter (HOSPITAL_COMMUNITY): Payer: PPO

## 2020-06-06 ENCOUNTER — Other Ambulatory Visit: Payer: Self-pay | Admitting: Nephrology

## 2020-06-06 DIAGNOSIS — M7989 Other specified soft tissue disorders: Secondary | ICD-10-CM

## 2020-06-06 DIAGNOSIS — R0602 Shortness of breath: Secondary | ICD-10-CM | POA: Diagnosis not present

## 2020-06-06 DIAGNOSIS — I2729 Other secondary pulmonary hypertension: Secondary | ICD-10-CM

## 2020-06-06 DIAGNOSIS — R059 Cough, unspecified: Secondary | ICD-10-CM

## 2020-06-06 LAB — ECHOCARDIOGRAM COMPLETE
Area-P 1/2: 3.93 cm2
S' Lateral: 4.2 cm

## 2020-06-09 ENCOUNTER — Telehealth: Payer: Self-pay

## 2020-06-09 DIAGNOSIS — I5042 Chronic combined systolic (congestive) and diastolic (congestive) heart failure: Secondary | ICD-10-CM

## 2020-06-09 DIAGNOSIS — N186 End stage renal disease: Secondary | ICD-10-CM

## 2020-06-09 NOTE — Telephone Encounter (Signed)
Referral placed for advanced heart failure clinic.

## 2020-06-09 NOTE — Telephone Encounter (Signed)
-----   Message from Thayer Headings, MD sent at 06/09/2020  9:49 AM EDT ----- Dianah Field  I suspect his pulmonary HTN is due to his CKD, morbid obesity with obesity hypoventilation, OSA. We can set up an appt with the advance CHF clinic and arrange for a right heart cath.   The right heart cath does not involve contrast so it will not cause any renal impairment .  Donnetta Simpers, will you place a referral to the Advance CHF clinic in [place]. Thanks  Phil    ----- Message ----- From: [name], MD Sent: 06/09/2020   7:17 AM EDT To: Thayer Headings, MD  Dr. Acie Fredrickson, We have a mutual patient who continues to have SOB and leg swelling.  Recent echo suggests pulmonary artery hypertension:  "Right Ventricle: The right ventricular size is mildly enlarged. No increase in right ventricular wall thickness. Right ventricular systolic function is normal. There is moderately elevated pulmonary artery systolic pressure. The tricuspid regurgitant  velocity is 3.51 m/s, and with an assumed right atrial pressure of 8 mmHg, the estimated right ventricular systolic pressure is 92.4 mmHg."  Is there anything else that can be done to help him from that standpoint?  I am out of ideas.  Do you suggest trying sildenafil (with his ckd, RH cath would likely push him into dialysis).  Any suggestions you have are appreciated. Thanks Gershon Mussel

## 2020-06-13 ENCOUNTER — Ambulatory Visit (HOSPITAL_COMMUNITY)
Admission: RE | Admit: 2020-06-13 | Discharge: 2020-06-13 | Disposition: A | Discharge status: Home / Self Care | Payer: PPO | Source: Ambulatory Visit | Attending: Nephrology | Admitting: Nephrology

## 2020-06-13 ENCOUNTER — Other Ambulatory Visit: Payer: Self-pay

## 2020-06-13 VITALS — BP 130/61 | HR 72 | Temp 97.6°F | Resp 18

## 2020-06-13 DIAGNOSIS — N179 Acute kidney failure, unspecified: Secondary | ICD-10-CM | POA: Insufficient documentation

## 2020-06-13 LAB — POCT HEMOGLOBIN-HEMACUE: Hemoglobin: 9 g/dL — ABNORMAL LOW (ref 13.0–17.0)

## 2020-06-13 MED ORDER — EPOETIN ALFA-EPBX 10000 UNIT/ML IJ SOLN
20000.0000 [IU] | INTRAMUSCULAR | Status: DC
  Administered 2020-06-13: 20000 [IU] via SUBCUTANEOUS

## 2020-06-13 MED ORDER — SODIUM CHLORIDE 0.9 % IV SOLN
510.0000 mg | INTRAVENOUS | Status: AC
  Administered 2020-06-13: 510 mg via INTRAVENOUS
  Filled 2020-06-13: qty 510

## 2020-06-13 MED ORDER — EPOETIN ALFA-EPBX 10000 UNIT/ML IJ SOLN
INTRAMUSCULAR | Status: AC
  Filled 2020-06-13: qty 2

## 2020-06-14 ENCOUNTER — Telehealth (HOSPITAL_COMMUNITY): Payer: Self-pay | Admitting: Internal Medicine

## 2020-06-14 NOTE — Telephone Encounter (Signed)
Pt called to f/u w/referral, please advise 

## 2020-06-15 ENCOUNTER — Other Ambulatory Visit: Payer: Self-pay

## 2020-06-15 ENCOUNTER — Ambulatory Visit (INDEPENDENT_AMBULATORY_CARE_PROVIDER_SITE_OTHER): Payer: PPO | Admitting: Ophthalmology

## 2020-06-15 ENCOUNTER — Encounter: Payer: Self-pay | Admitting: Family Medicine

## 2020-06-15 ENCOUNTER — Encounter (INDEPENDENT_AMBULATORY_CARE_PROVIDER_SITE_OTHER): Payer: Self-pay | Admitting: Ophthalmology

## 2020-06-15 DIAGNOSIS — E113412 Type 2 diabetes mellitus with severe nonproliferative diabetic retinopathy with macular edema, left eye: Secondary | ICD-10-CM | POA: Diagnosis not present

## 2020-06-15 DIAGNOSIS — H35033 Hypertensive retinopathy, bilateral: Secondary | ICD-10-CM

## 2020-06-15 DIAGNOSIS — E113411 Type 2 diabetes mellitus with severe nonproliferative diabetic retinopathy with macular edema, right eye: Secondary | ICD-10-CM | POA: Diagnosis not present

## 2020-06-15 MED ORDER — BEVACIZUMAB 2.5 MG/0.1ML IZ SOSY
2.5000 mg | PREFILLED_SYRINGE | INTRAVITREAL | Status: AC | PRN
  Administered 2020-06-15: 2.5 mg via INTRAVITREAL

## 2020-06-15 NOTE — Progress Notes (Signed)
06/15/2020     CHIEF COMPLAINT Patient presents for Retina Follow Up (9month fu os/ Avastin OS/Pt states VA OU stable since last visit. Pt denies FOL, floaters, or ocular pain OU. //A1C: 6.5/LBS: 145)   HISTORY OF PRESENT ILLNESS: Travis Blair is a 70 y.o. male who presents to the clinic today for:   HPI    Retina Follow Up    Diagnosis: Diabetic Retinopathy   Laterality: left eye   Onset: 1 month ago   Duration: 1 month   Course: stable   Comments: 20month fu os/ Avastin OS Pt states VA OU stable since last visit. Pt denies FOL, floaters, or ocular pain OU.   A1C: 6.5 LBS: 145       Last edited by Kendra Opitz, COA on 06/15/2020  3:27 PM. (History)      Referring physician: [name], MD [address] [place],  [place] [address]  HISTORICAL INFORMATION:   Selected notes from the MEDICAL RECORD NUMBER    Lab Results  Component Value Date   HGBA1C 6.3 (H) 01/27/2020     CURRENT MEDICATIONS: No current outpatient medications on file. (Ophthalmic Drugs)   No current facility-administered medications for this visit. (Ophthalmic Drugs)   Current Outpatient Medications (Other)  Medication Sig  . albuterol (VENTOLIN HFA) 108 (90 Base) MCG/ACT inhaler Inhale 1-2 puffs into the lungs every 6 (six) hours as needed for wheezing or shortness of breath.  Marland Kitchen aspirin EC 81 MG tablet Take 81 mg by mouth daily. Swallow whole.  . carvedilol (COREG) 6.25 MG tablet TAKE 1 TABLET (6.25 MG TOTAL) BY MOUTH 2 (TWO) TIMES DAILY WITH A MEAL.  Marland Kitchen ELIQUIS 5 MG TABS tablet TAKE 1 TABLET BY MOUTH TWICE A DAY  . ferrous sulfate (FERROUSUL) 325 (65 FE) MG tablet Take 1 tablet (325 mg total) by mouth daily with breakfast.  . fluticasone (FLONASE) 50 MCG/ACT nasal spray Place 2 sprays into both nostrils daily.  . metolazone (ZAROXOLYN) 5 MG tablet Take 1 tablet (5 mg total) by mouth daily.  . Multiple Vitamins-Minerals (CENTRUM SILVER 50+MEN PO) Take 1 tablet by mouth daily.   Marland Kitchen oxyCODONE-acetaminophen (PERCOCET) 5-325 MG tablet Take 1 tablet by mouth every 6 (six) hours as needed for severe pain.  Marland Kitchen REPATHA SURECLICK 027 MG/ML SOAJ INJECT 1 PEN INTO THE SKIN EVERY 14 (FOURTEEN) DAYS.  Marland Kitchen torsemide (DEMADEX) 20 MG tablet Take 2 tablets (40 mg total) by mouth daily. (Patient taking differently: Take 80 mg by mouth daily.)  . triamcinolone cream (KENALOG) 0.1 % Apply 1 application topically 2 (two) times daily.   No current facility-administered medications for this visit. (Other)      REVIEW OF SYSTEMS:    ALLERGIES Allergies  Allergen Reactions  . Lipitor [Atorvastatin]     Leg aches     PAST MEDICAL HISTORY Past Medical History:  Diagnosis Date  . Anemia associated with chronic renal failure basically  . Asthma    as a child  . CHF (congestive heart failure) ([place])   . Chronic kidney disease    Sees Dr [name]  . Constipation   . Coronary artery disease   . Diabetes mellitus without complication ([place])   . DVT (deep venous thrombosis) ([place])   . History of blood clots   . Hyperlipidemia   . Hypertension   . Leg pain    ABIs/LE Arterial US 03/2019: ABIs unreliable; +calcification, no evidence of stenosis  . Myocardial infarction ([place])   .  Obesity   . Pneumonia 2018  . Pulmonary embolism ([place])   . Sleep apnea    CPAP  . SOB (shortness of breath)   . Swelling of both lower extremities    Past Surgical History:  Procedure Laterality Date  . AV FISTULA PLACEMENT Left 05/04/2020   Procedure: LEFT ARM ARTERIOVENOUS (AV) FISTULA CREATION;  Surgeon: Rosetta Posner, MD;  Location: [facility];  Service: Vascular;  Laterality: Left;  . COLONOSCOPY    . CORONARY ARTERY BYPASS GRAFT N/A 03/07/2017   Procedure: CORONARY ARTERY BYPASS GRAFTING (CABG), X 3 , USING RIGHT INTERNAL MAMMARY ARTERY, AND RIGHT LEG GREATER SAPHENOUS VEIN HARVESTED ENDOSCOPICALLY;  Surgeon: [name], MD;  Location: [facility];  Service: Open Heart Surgery;  Laterality: N/A;  . HAND  SURGERY Right 1991  . RIGHT/LEFT HEART CATH AND CORONARY ANGIOGRAPHY N/A 12/20/2016   Procedure: RIGHT/LEFT HEART CATH AND CORONARY ANGIOGRAPHY;  Surgeon: [name], MD;  Location: [facility];  Service: Cardiovascular;  Laterality: N/A;  . TEE WITHOUT CARDIOVERSION N/A 03/07/2017   Procedure: TRANSESOPHAGEAL ECHOCARDIOGRAM (TEE);  Surgeon: [name], [name], MD;  Location: [facility];  Service: Open Heart Surgery;  Laterality: N/A;  . TONSILLECTOMY      FAMILY HISTORY Family History  Problem Relation Age of Onset  . Heart disease Mother   . Diabetes Mother   . Multiple myeloma Mother   . Cancer Father        type unknown, as a child  . Heart disease Father   . Hypertension Father   . Congenital heart disease Father   . Hyperlipidemia Father   . Lymphoma Sister     SOCIAL HISTORY Social History   Tobacco Use  . Smoking status: Never Smoker  . Smokeless tobacco: Never Used  Vaping Use  . Vaping Use: Never used  Substance Use Topics  . Alcohol use: No  . Drug use: No         OPHTHALMIC EXAM: Base Eye Exam    Visual Acuity (ETDRS)      Right Left   Dist [place] 20/20 -2 20/20 -2   Correction: Glasses       Tonometry (Tonopen, 3:32 PM)      Right Left   Pressure 12 13       Pupils      Pupils Dark Light Shape React APD   Right PERRL 5 4 Round Slow None   Left PERRL 5 4 Round Slow None       Visual Fields (Counting fingers)      Left Right    Full Full       Extraocular Movement      Right Left    Full Full       Neuro/Psych    Oriented x3: Yes       Dilation    Left eye: 1.0% Mydriacyl, 2.5% Phenylephrine @ 3:32 PM        Slit Lamp and Fundus Exam    External Exam      Right Left   External Normal Normal       Slit Lamp Exam      Right Left   Lids/Lashes Normal Normal   Conjunctiva/Sclera White and quiet White and quiet   Cornea Clear Clear   Anterior Chamber Deep and quiet Deep and quiet   Iris Round and reactive Round and  reactive   Lens 2+ Nuclear sclerosis 2+ Nuclear sclerosis   Anterior Vitreous Normal Normal  Fundus Exam      Right Left   Posterior Vitreous  Normal   Disc  Normal   C/D Ratio  0.25   Macula  CSME diffuse with posterior pole cotton-wool spots capillary dropout" swamp edema", Macular thickening, Microaneurysms, Mild clinically significant macular edema   Vessels  NPDR-Severe, with severe retinal nonperfusion capillary dropout with glassy opacification of the peripheral retina   Periphery  Cotton wool spots, PERI papillary in the posterior pole          IMAGING AND PROCEDURES  Imaging and Procedures for 06/15/20  OCT, Retina - OU - Both Eyes       Right Eye Quality was good. Scan locations included subfoveal. Central Foveal Thickness: 278. Progression has improved. Findings include vitreomacular adhesion , cystoid macular edema.   Left Eye Quality was good. Scan locations included subfoveal. Central Foveal Thickness: 257. Progression has improved. Findings include vitreomacular adhesion , cystoid macular edema.   Notes Vastly improved CSME OS still active.  Improved overall yet at 3 months follow-up  CSME OD improved on intravitreal Avastin, currently at 4-week follow-up.  We will repeat injection today and reexamine as scheduled       Intravitreal Injection, Pharmacologic Agent - OS - Left Eye       Time Out 06/15/2020. 4:24 PM. Confirmed correct patient, procedure, site, and patient consented.   Anesthesia Topical anesthesia was used. Anesthetic medications included [place] 3.5%.   Procedure Preparation included Ofloxacin , 10% betadine to eyelids, 5% betadine to ocular surface. A 30 gauge needle was used.   Injection:  2.5 mg Bevacizumab (AVASTIN) 2.5mg /0.28mL SOSY   NDC: 35573-220-25, Lot: [id number]   Route: Intravitreal, Site: Left Eye  Post-op Post injection exam found visual acuity of at least counting fingers. The patient tolerated the procedure well.  There were no complications. The patient received written and verbal post procedure care education. Post injection medications were not given.                 ASSESSMENT/PLAN:  Severe nonproliferative diabetic retinopathy of left eye, with macular edema, associated with type 2 diabetes mellitus (HCC) Improved overall CSME yet still multifocal active.,  At 3 months follow-up today repeat injection today  Severe nonproliferative diabetic retinopathy of right eye, with macular edema, associated with type 2 diabetes mellitus (HCC) Much improved CSME at 4 weeks postinjection today follow-up as scheduled  Hypertensive retinopathy of both eyes Numerous posterior pole pericapillary cotton-wool spots are still seen suggesting superimposed hypertensive retinopathy upon severe diabetic retinopathy.  It does raise the possibility that inadequately treated sleep apnea with nightly hypoxia triggering episodic hypertension episodes may be contributing to this type of retinal vascular damage.  I do recommend follow-up with his doctor regarding this care and determining whether or not as the patient says "the pressures need to be increased".      ICD-10-CM   1. Severe nonproliferative diabetic retinopathy of left eye, with macular edema, associated with type 2 diabetes mellitus (HCC)  J62.8315 OCT, Retina - OU - Both Eyes    Intravitreal Injection, Pharmacologic Agent - OS - Left Eye    bevacizumab (AVASTIN) SOSY 2.5 mg  2. Severe nonproliferative diabetic retinopathy of right eye, with macular edema, associated with type 2 diabetes mellitus ([place])  E11.3411   3. Hypertensive retinopathy of both eyes  H35.033     1.  OS overall improved yet with multifocal CSME threatening center involvement of the fovea.  Repeat injection Avastin today and follow-up  in 8 weeks left eye for focal laser  treatment to these areas to prevent recurrences  2.  OD, follow-up as scheduled  3.  Sitter follow-up with his  physician regarding the  efficacy of current CPAP usage  Ophthalmic Meds Ordered this visit:  Meds ordered this encounter  Medications  . bevacizumab (AVASTIN) SOSY 2.5 mg       Return in about 6 weeks (around 07/27/2020) for dilate, OS, FOCAL.  There are no Patient Instructions on file for this visit.   Explained the diagnoses, plan, and follow up with the patient and they expressed understanding.  Patient expressed understanding of the importance of proper follow up care.   Clent Demark Nattie Lazenby M.D. Diseases & Surgery of the Retina and Vitreous [facility]ina [facility]abetic [facility] 06/15/20     Abbreviations: M myopia (nearsighted); A astigmatism; H hyperopia (farsighted); P presbyopia; Mrx spectacle prescription;  CTL contact lenses; OD right eye; OS left eye; OU both eyes  XT exotropia; ET esotropia; PEK punctate epithelial keratitis; PEE punctate epithelial erosions; DES dry eye syndrome; MGD meibomian gland dysfunction; ATs artificial tears; PFAT's preservative free artificial tears; [place] nuclear sclerotic cataract; PSC posterior subcapsular cataract; ERM epi-retinal membrane; PVD posterior vitreous detachment; RD retinal detachment; DM diabetes mellitus; DR diabetic retinopathy; NPDR non-proliferative diabetic retinopathy; PDR proliferative diabetic retinopathy; CSME clinically significant macular edema; DME diabetic macular edema; dbh dot blot hemorrhages; CWS cotton wool spot; POAG primary open angle glaucoma; C/D cup-to-disc ratio; HVF humphrey visual field; GVF goldmann visual field; OCT optical coherence tomography; IOP intraocular pressure; BRVO Branch retinal vein occlusion; CRVO central retinal vein occlusion; CRAO central retinal artery occlusion; BRAO branch retinal artery occlusion; RT retinal tear; SB scleral buckle; PPV pars plana vitrectomy; VH Vitreous hemorrhage; PRP panretinal laser photocoagulation; IVK intravitreal kenalog; VMT vitreomacular traction; MH Macular hole;  NVD  neovascularization of the disc; NVE neovascularization elsewhere; AREDS age related eye disease study; ARMD age related macular degeneration; POAG primary open angle glaucoma; EBMD epithelial/anterior basement membrane dystrophy; ACIOL anterior chamber intraocular lens; IOL intraocular lens; PCIOL posterior chamber intraocular lens; Phaco/IOL phacoemulsification with intraocular lens placement; [place] photorefractive keratectomy; LASIK laser assisted in situ keratomileusis; HTN hypertension; DM diabetes mellitus; COPD chronic obstructive pulmonary disease

## 2020-06-15 NOTE — Assessment & Plan Note (Signed)
Numerous posterior pole pericapillary cotton-wool spots are still seen suggesting superimposed hypertensive retinopathy upon severe diabetic retinopathy.  It does raise the possibility that inadequately treated sleep apnea with nightly hypoxia triggering episodic hypertension episodes may be contributing to this type of retinal vascular damage.  I do recommend follow-up with his doctor regarding this care and determining whether or not as the patient says "the pressures need to be increased".

## 2020-06-15 NOTE — Assessment & Plan Note (Signed)
Much improved CSME at 4 weeks postinjection today follow-up as scheduled

## 2020-06-15 NOTE — Assessment & Plan Note (Signed)
Improved overall CSME yet still multifocal active.,  At 3 months follow-up today repeat injection today

## 2020-06-21 ENCOUNTER — Encounter: Payer: Self-pay | Admitting: Family Medicine

## 2020-06-23 ENCOUNTER — Other Ambulatory Visit: Payer: Self-pay

## 2020-06-23 DIAGNOSIS — I2510 Atherosclerotic heart disease of native coronary artery without angina pectoris: Secondary | ICD-10-CM

## 2020-06-23 DIAGNOSIS — E782 Mixed hyperlipidemia: Secondary | ICD-10-CM

## 2020-06-23 MED ORDER — REPATHA SURECLICK 140 MG/ML ~~LOC~~ SOAJ: 1.0000 "pen " | SUBCUTANEOUS | 3 refills | Status: DC

## 2020-06-23 NOTE — Telephone Encounter (Signed)
Will arrange [facility] for 5/27, will call pt back with time and instructions.

## 2020-06-23 NOTE — Telephone Encounter (Signed)
Pt needs new refills sent to [facility]

## 2020-06-26 ENCOUNTER — Other Ambulatory Visit: Payer: Self-pay

## 2020-06-26 ENCOUNTER — Ambulatory Visit (INDEPENDENT_AMBULATORY_CARE_PROVIDER_SITE_OTHER): Payer: PPO | Admitting: Family Medicine

## 2020-06-26 ENCOUNTER — Encounter: Payer: Self-pay | Admitting: Family Medicine

## 2020-06-26 VITALS — BP 134/68 | HR 74 | Temp 98.2°F | Resp 16 | Ht 71.5 in | Wt 303.0 lb

## 2020-06-26 DIAGNOSIS — J310 Chronic rhinitis: Secondary | ICD-10-CM | POA: Diagnosis not present

## 2020-06-26 DIAGNOSIS — J329 Chronic sinusitis, unspecified: Secondary | ICD-10-CM | POA: Diagnosis not present

## 2020-06-26 DIAGNOSIS — M7989 Other specified soft tissue disorders: Secondary | ICD-10-CM | POA: Diagnosis not present

## 2020-06-26 MED ORDER — AMOXICILLIN-POT CLAVULANATE 875-125 MG PO TABS: 1.0000 | ORAL_TABLET | Freq: Two times a day (BID) | ORAL | 0 refills | Status: DC

## 2020-06-26 NOTE — Progress Notes (Signed)
Subjective:    Patient ID: Travis Blair, male    DOB: 03-Dec-1950, 70 y.o.   MRN: [id number]  HPI Patient states that he has been battling allergies and sinuses all spring.  However he has now developed pain and pressure in his frontal sinus area that has been present now for more than 7 to 10 days.  He reports postnasal drip triggering a cough.  He reports headaches.  He denies any fevers or chills.  He does report frequent and copious rhinorrhea.  He also has gained substantial weight back and he has significant edema in his legs and also some bibasilar crackles in both lungs concerning for possible pulmonary edema.  He has not had to use Zaroxolyn since I last advised him to do so but is instead using his loop diuretic only. Past Medical History:  Diagnosis Date  . Anemia associated with chronic renal failure basically  . Asthma    as a child  . CHF (congestive heart failure) ([place])   . Chronic kidney disease    Sees Dr [name]  . Constipation   . Coronary artery disease   . Diabetes mellitus without complication ([place])   . DVT (deep venous thrombosis) ([place])   . History of blood clots   . Hyperlipidemia   . Hypertension   . Leg pain    ABIs/LE Arterial US 03/2019: ABIs unreliable; +calcification, no evidence of stenosis  . Myocardial infarction ([place])   . Obesity   . Pneumonia 2018  . Pulmonary embolism ([place])   . Sleep apnea    CPAP  . SOB (shortness of breath)   . Swelling of both lower extremities    Past Surgical History:  Procedure Laterality Date  . AV FISTULA PLACEMENT Left 05/04/2020   Procedure: LEFT ARM ARTERIOVENOUS (AV) FISTULA CREATION;  Surgeon: Rosetta Posner, MD;  Location: [facility];  Service: Vascular;  Laterality: Left;  . COLONOSCOPY    . CORONARY ARTERY BYPASS GRAFT N/A 03/07/2017   Procedure: CORONARY ARTERY BYPASS GRAFTING (CABG), X 3 , USING RIGHT INTERNAL MAMMARY ARTERY, AND RIGHT LEG GREATER SAPHENOUS VEIN HARVESTED ENDOSCOPICALLY;  Surgeon: [name], MD;  Location: [facility];  Service: Open Heart Surgery;  Laterality: N/A;  . HAND SURGERY Right 1991  . RIGHT/LEFT HEART CATH AND CORONARY ANGIOGRAPHY N/A 12/20/2016   Procedure: RIGHT/LEFT HEART CATH AND CORONARY ANGIOGRAPHY;  Surgeon: [name], MD;  Location: [facility];  Service: Cardiovascular;  Laterality: N/A;  . TEE WITHOUT CARDIOVERSION N/A 03/07/2017   Procedure: TRANSESOPHAGEAL ECHOCARDIOGRAM (TEE);  Surgeon: [name], [name], MD;  Location: [facility];  Service: Open Heart Surgery;  Laterality: N/A;  . TONSILLECTOMY     Current Outpatient Medications on File Prior to Visit  Medication Sig Dispense Refill  . albuterol (VENTOLIN HFA) 108 (90 Base) MCG/ACT inhaler Inhale 1-2 puffs into the lungs every 6 (six) hours as needed for wheezing or shortness of breath.    Marland Kitchen aspirin EC 81 MG tablet Take 81 mg by mouth daily. Swallow whole.    . carvedilol (COREG) 6.25 MG tablet TAKE 1 TABLET (6.25 MG TOTAL) BY MOUTH 2 (TWO) TIMES DAILY WITH A MEAL. 180 tablet 1  . ELIQUIS 5 MG TABS tablet TAKE 1 TABLET BY MOUTH TWICE A DAY 60 tablet 5  . Evolocumab (REPATHA SURECLICK) 798 MG/ML SOAJ Inject 1 pen into the skin every 14 (fourteen) days. 6 mL 3  . ferrous sulfate (FERROUSUL) 325 (65 FE) MG tablet Take  1 tablet (325 mg total) by mouth daily with breakfast.  3  . fluticasone (FLONASE) 50 MCG/ACT nasal spray Place 2 sprays into both nostrils daily. 16 g 6  . metolazone (ZAROXOLYN) 5 MG tablet Take 1 tablet (5 mg total) by mouth daily. 30 tablet 1  . Multiple Vitamins-Minerals (CENTRUM SILVER 50+MEN PO) Take 1 tablet by mouth daily.    Marland Kitchen oxyCODONE-acetaminophen (PERCOCET) 5-325 MG tablet Take 1 tablet by mouth every 6 (six) hours as needed for severe pain. 8 tablet 0  . torsemide (DEMADEX) 20 MG tablet Take 2 tablets (40 mg total) by mouth daily. (Patient taking differently: Take 80 mg by mouth daily.) 180 tablet 3  . triamcinolone cream (KENALOG) 0.1 % Apply 1 application topically 2  (two) times daily. 30 g 0   No current facility-administered medications on file prior to visit.   Allergies  Allergen Reactions  . Lipitor [Atorvastatin]     Leg aches    Social History   Socioeconomic History  . Marital status: Married    Spouse name: Not on file  . Number of children: 2  . Years of education: Not on file  . Highest education level: Not on file  Occupational History  . Occupation: general contractor-retired  Tobacco Use  . Smoking status: Never Smoker  . Smokeless tobacco: Never Used  Vaping Use  . Vaping Use: Never used  Substance and Sexual Activity  . Alcohol use: No  . Drug use: No  . Sexual activity: Never  Other Topics Concern  . Not on file  Social History Narrative  . Not on file   Social Determinants of Health   Financial Resource Strain: Not on file  Food Insecurity: Not on file  Transportation Needs: Not on file  Physical Activity: Not on file  Stress: Not on file  Social Connections: Not on file  Intimate Partner Violence: Not on file      Review of Systems  All other systems reviewed and are negative.      Objective:   Physical Exam Constitutional:      General: He is not in acute distress.    Appearance: He is obese. He is not ill-appearing or toxic-appearing.  HENT:     Right Ear: Tympanic membrane and ear canal normal.     Left Ear: Tympanic membrane and ear canal normal.     Nose: Congestion and rhinorrhea present.  Cardiovascular:     Rate and Rhythm: Normal rate and regular rhythm.  Pulmonary:     Breath sounds: Rales present.  Abdominal:     General: Abdomen is flat. Bowel sounds are normal.  Musculoskeletal:     Right lower leg: Edema present.     Left lower leg: Edema present.  Neurological:     Mental Status: He is alert.           Assessment & Plan:  Rhinosinusitis  Leg swelling  Treat the rhinosinusitis with Augmentin 875 mg twice daily for 10 days.  Treat the leg swelling with Zaroxolyn 5  mg p.o. x1, 30 minutes before his a.m. dose of torsemide and then take 80 mg of torsemide as scheduled.

## 2020-06-27 ENCOUNTER — Ambulatory Visit (HOSPITAL_COMMUNITY)
Admission: RE | Admit: 2020-06-27 | Discharge: 2020-06-27 | Disposition: A | Discharge status: Home / Self Care | Payer: PPO | Source: Ambulatory Visit | Attending: Nephrology | Admitting: Nephrology

## 2020-06-27 ENCOUNTER — Encounter (HOSPITAL_COMMUNITY): Payer: PPO

## 2020-06-27 VITALS — BP 136/63 | HR 78 | Temp 98.3°F | Resp 16

## 2020-06-27 DIAGNOSIS — D631 Anemia in chronic kidney disease: Secondary | ICD-10-CM | POA: Insufficient documentation

## 2020-06-27 DIAGNOSIS — N184 Chronic kidney disease, stage 4 (severe): Secondary | ICD-10-CM | POA: Insufficient documentation

## 2020-06-27 DIAGNOSIS — N179 Acute kidney failure, unspecified: Secondary | ICD-10-CM

## 2020-06-27 LAB — IRON AND TIBC
Iron: 52 ug/dL (ref 45–182)
Saturation Ratios: 16 % — ABNORMAL LOW (ref 17.9–39.5)
TIBC: 330 ug/dL (ref 250–450)
UIBC: 278 ug/dL

## 2020-06-27 LAB — FERRITIN: Ferritin: 52 ng/mL (ref 24–336)

## 2020-06-27 MED ORDER — EPOETIN ALFA-EPBX 10000 UNIT/ML IJ SOLN
INTRAMUSCULAR | Status: AC
  Filled 2020-06-27: qty 2

## 2020-06-27 MED ORDER — EPOETIN ALFA-EPBX 10000 UNIT/ML IJ SOLN
20000.0000 [IU] | INTRAMUSCULAR | Status: DC
  Administered 2020-06-27: 20000 [IU] via SUBCUTANEOUS

## 2020-06-28 LAB — POCT HEMOGLOBIN-HEMACUE: Hemoglobin: 10 g/dL — ABNORMAL LOW (ref 13.0–17.0)

## 2020-06-29 ENCOUNTER — Encounter (HOSPITAL_COMMUNITY): Payer: Self-pay | Admitting: *Deleted

## 2020-06-29 ENCOUNTER — Telehealth (HOSPITAL_COMMUNITY): Payer: Self-pay | Admitting: *Deleted

## 2020-06-29 NOTE — Telephone Encounter (Signed)
Called patient and reviewed cath instructions. Will also send cath instruction letter via mychart.

## 2020-07-04 ENCOUNTER — Other Ambulatory Visit: Payer: Self-pay

## 2020-07-04 ENCOUNTER — Ambulatory Visit (INDEPENDENT_AMBULATORY_CARE_PROVIDER_SITE_OTHER): Payer: PPO | Admitting: Family Medicine

## 2020-07-04 ENCOUNTER — Encounter: Payer: Self-pay | Admitting: Family Medicine

## 2020-07-04 VITALS — BP 152/80 | HR 68 | Temp 97.9°F | Resp 16 | Ht 71.5 in | Wt 289.0 lb

## 2020-07-04 DIAGNOSIS — J40 Bronchitis, not specified as acute or chronic: Secondary | ICD-10-CM | POA: Diagnosis not present

## 2020-07-04 MED ORDER — AZITHROMYCIN 250 MG PO TABS: ORAL_TABLET | ORAL | 0 refills | Status: DC

## 2020-07-04 MED ORDER — GUAIFENESIN-CODEINE 100-10 MG/5ML PO SOLN: 10.0000 mL | Freq: Four times a day (QID) | ORAL | 0 refills | Status: DC | PRN

## 2020-07-04 MED ORDER — ALBUTEROL SULFATE HFA 108 (90 BASE) MCG/ACT IN AERS: 1.0000 | INHALATION_SPRAY | Freq: Four times a day (QID) | RESPIRATORY_TRACT | 3 refills | Status: AC | PRN

## 2020-07-04 NOTE — Progress Notes (Signed)
Subjective:    Patient ID: Travis Blair, male    DOB: 1950/05/05, 70 y.o.   MRN: [id number]  Patient was recently treated for a sinus infection with Augmentin.  He states that his sinus pressure has improved however he has a persistent cough.  He is not reporting any fever or chills or purulent sputum.  He does have some shortness of breath.  He denies any pleurisy.  However his oxygen saturation is down to 92% on room air and he has left basilar crackles.  However his edema has improved and his weight is down to 289 pounds.  This makes me concerned about a possible infiltrate in the left lower lobe. Past Medical History:  Diagnosis Date  . Anemia associated with chronic renal failure basically  . Asthma    as a child  . CHF (congestive heart failure) ([place])   . Chronic kidney disease    Sees Dr [name]  . Constipation   . Coronary artery disease   . Diabetes mellitus without complication ([place])   . DVT (deep venous thrombosis) ([place])   . History of blood clots   . Hyperlipidemia   . Hypertension   . Leg pain    ABIs/LE Arterial US 03/2019: ABIs unreliable; +calcification, no evidence of stenosis  . Myocardial infarction ([place])   . Obesity   . Pneumonia 2018  . Pulmonary embolism ([place])   . Sleep apnea    CPAP  . SOB (shortness of breath)   . Swelling of both lower extremities    Past Surgical History:  Procedure Laterality Date  . AV FISTULA PLACEMENT Left 05/04/2020   Procedure: LEFT ARM ARTERIOVENOUS (AV) FISTULA CREATION;  Surgeon: Rosetta Posner, MD;  Location: [facility];  Service: Vascular;  Laterality: Left;  . COLONOSCOPY    . CORONARY ARTERY BYPASS GRAFT N/A 03/07/2017   Procedure: CORONARY ARTERY BYPASS GRAFTING (CABG), X 3 , USING RIGHT INTERNAL MAMMARY ARTERY, AND RIGHT LEG GREATER SAPHENOUS VEIN HARVESTED ENDOSCOPICALLY;  Surgeon: [name], MD;  Location: [facility];  Service: Open Heart Surgery;  Laterality: N/A;  . HAND SURGERY Right 1991  . RIGHT/LEFT HEART CATH  AND CORONARY ANGIOGRAPHY N/A 12/20/2016   Procedure: RIGHT/LEFT HEART CATH AND CORONARY ANGIOGRAPHY;  Surgeon: [name], MD;  Location: [facility];  Service: Cardiovascular;  Laterality: N/A;  . TEE WITHOUT CARDIOVERSION N/A 03/07/2017   Procedure: TRANSESOPHAGEAL ECHOCARDIOGRAM (TEE);  Surgeon: [name], [name], MD;  Location: [facility];  Service: Open Heart Surgery;  Laterality: N/A;  . TONSILLECTOMY     Current Outpatient Medications on File Prior to Visit  Medication Sig Dispense Refill  . amoxicillin-clavulanate (AUGMENTIN) 875-125 MG tablet Take 1 tablet by mouth 2 (two) times daily. 20 tablet 0  . carvedilol (COREG) 6.25 MG tablet TAKE 1 TABLET (6.25 MG TOTAL) BY MOUTH 2 (TWO) TIMES DAILY WITH A MEAL. 180 tablet 1  . ELIQUIS 5 MG TABS tablet TAKE 1 TABLET BY MOUTH TWICE A DAY (Patient taking differently: Take 5 mg by mouth 2 (two) times daily.) 60 tablet 5  . ferrous sulfate (FERROUSUL) 325 (65 FE) MG tablet Take 1 tablet (325 mg total) by mouth daily with breakfast.  3  . fluticasone (FLONASE) 50 MCG/ACT nasal spray Place 2 sprays into both nostrils daily. 16 g 6  . metolazone (ZAROXOLYN) 5 MG tablet Take 1 tablet (5 mg total) by mouth daily. (Patient taking differently: Take 5 mg by mouth daily as needed (Fluid).) 30 tablet  1  . torsemide (DEMADEX) 20 MG tablet Take 2 tablets (40 mg total) by mouth daily. (Patient taking differently: Take 80 mg by mouth daily.) 180 tablet 3  . triamcinolone cream (KENALOG) 0.1 % Apply 1 application topically 2 (two) times daily. 30 g 0  . Evolocumab (REPATHA SURECLICK) 038 MG/ML SOAJ Inject 1 pen into the skin every 14 (fourteen) days. (Patient not taking: Reported on 07/04/2020) 6 mL 3   No current facility-administered medications on file prior to visit.   Allergies  Allergen Reactions  . Lipitor [Atorvastatin]     Leg aches    Social History   Socioeconomic History  . Marital status: Married    Spouse name: Not on file  .  Number of children: 2  . Years of education: Not on file  . Highest education level: Not on file  Occupational History  . Occupation: general contractor-retired  Tobacco Use  . Smoking status: Never Smoker  . Smokeless tobacco: Never Used  Vaping Use  . Vaping Use: Never used  Substance and Sexual Activity  . Alcohol use: No  . Drug use: No  . Sexual activity: Never  Other Topics Concern  . Not on file  Social History Narrative  . Not on file   Social Determinants of Health   Financial Resource Strain: Not on file  Food Insecurity: Not on file  Transportation Needs: Not on file  Physical Activity: Not on file  Stress: Not on file  Social Connections: Not on file  Intimate Partner Violence: Not on file      Review of Systems  Respiratory: Positive for cough.   All other systems reviewed and are negative.      Objective:   Physical Exam Constitutional:      General: He is not in acute distress.    Appearance: He is obese. He is not ill-appearing or toxic-appearing.  HENT:     Right Ear: Tympanic membrane and ear canal normal.     Left Ear: Tympanic membrane and ear canal normal.     Nose: Congestion and rhinorrhea present.  Cardiovascular:     Rate and Rhythm: Normal rate and regular rhythm.  Pulmonary:     Effort: Pulmonary effort is normal.     Breath sounds: No decreased air movement or transmitted upper airway sounds. Rales present. No decreased breath sounds, wheezing or rhonchi.    Abdominal:     General: Abdomen is flat. Bowel sounds are normal.  Musculoskeletal:     Right lower leg: Edema present.     Left lower leg: Edema present.  Neurological:     Mental Status: He is alert.           Assessment & Plan:  Bronchitis  Begin a Z-Pak.  I am concerned by the left basilar crackles and his declining oxygen saturation despite his edema improving.  Therefore I will switch to Zithromax to cover respiratory pathogens more appropriately than  Augmentin per tickly atypicals.  He can also use Robitussin-AC 1 teaspoon to 2 teaspoons every 6 hours as needed.  Reassess in 1 week or sooner if worse.  If worsening we will need to get a chest x-ray

## 2020-07-05 ENCOUNTER — Other Ambulatory Visit (HOSPITAL_COMMUNITY): Payer: PPO

## 2020-07-07 ENCOUNTER — Ambulatory Visit (HOSPITAL_COMMUNITY)
Admission: RE | Admit: 2020-07-07 | Discharge: 2020-07-07 | Disposition: A | Discharge status: Home / Self Care | Payer: PPO | Attending: Internal Medicine | Admitting: Internal Medicine

## 2020-07-07 ENCOUNTER — Other Ambulatory Visit: Payer: Self-pay

## 2020-07-07 ENCOUNTER — Encounter (HOSPITAL_COMMUNITY)
Admission: RE | Disposition: A | Discharge status: Home / Self Care | Payer: Self-pay | Source: Home / Self Care | Attending: Internal Medicine

## 2020-07-07 DIAGNOSIS — E1122 Type 2 diabetes mellitus with diabetic chronic kidney disease: Secondary | ICD-10-CM | POA: Diagnosis not present

## 2020-07-07 DIAGNOSIS — I2510 Atherosclerotic heart disease of native coronary artery without angina pectoris: Secondary | ICD-10-CM | POA: Insufficient documentation

## 2020-07-07 DIAGNOSIS — G4733 Obstructive sleep apnea (adult) (pediatric): Secondary | ICD-10-CM | POA: Insufficient documentation

## 2020-07-07 DIAGNOSIS — Z6838 Body mass index (BMI) 38.0-38.9, adult: Secondary | ICD-10-CM | POA: Diagnosis not present

## 2020-07-07 DIAGNOSIS — I272 Pulmonary hypertension, unspecified: Secondary | ICD-10-CM | POA: Diagnosis not present

## 2020-07-07 DIAGNOSIS — Z7901 Long term (current) use of anticoagulants: Secondary | ICD-10-CM | POA: Diagnosis not present

## 2020-07-07 DIAGNOSIS — I130 Hypertensive heart and chronic kidney disease with heart failure and stage 1 through stage 4 chronic kidney disease, or unspecified chronic kidney disease: Secondary | ICD-10-CM | POA: Diagnosis not present

## 2020-07-07 DIAGNOSIS — N184 Chronic kidney disease, stage 4 (severe): Secondary | ICD-10-CM | POA: Insufficient documentation

## 2020-07-07 DIAGNOSIS — Z79899 Other long term (current) drug therapy: Secondary | ICD-10-CM | POA: Diagnosis not present

## 2020-07-07 HISTORY — PX: RIGHT HEART CATH: CATH118263

## 2020-07-07 LAB — POCT I-STAT EG7
Acid-Base Excess: 3 mmol/L — ABNORMAL HIGH (ref 0.0–2.0)
Acid-Base Excess: 2 mmol/L (ref 0.0–2.0)
Bicarbonate: 28.5 mmol/L — ABNORMAL HIGH (ref 20.0–28.0)
Bicarbonate: 27.6 mmol/L (ref 20.0–28.0)
Calcium, Ion: 1.19 mmol/L (ref 1.15–1.40)
Calcium, Ion: 1.14 mmol/L — ABNORMAL LOW (ref 1.15–1.40)
HCT: 30 % — ABNORMAL LOW (ref 39.0–52.0)
HCT: 30 % — ABNORMAL LOW (ref 39.0–52.0)
Hemoglobin: 10.2 g/dL — ABNORMAL LOW (ref 13.0–17.0)
Hemoglobin: 10.2 g/dL — ABNORMAL LOW (ref 13.0–17.0)
O2 Saturation: 63 %
O2 Saturation: 63 %
Potassium: 4 mmol/L (ref 3.5–5.1)
Potassium: 4 mmol/L (ref 3.5–5.1)
Sodium: 136 mmol/L (ref 135–145)
Sodium: 135 mmol/L (ref 135–145)
TCO2: 30 mmol/L (ref 22–32)
TCO2: 29 mmol/L (ref 22–32)
pCO2, Ven: 48.1 mmHg (ref 44.0–60.0)
pCO2, Ven: 47.9 mmHg (ref 44.0–60.0)
pH, Ven: 7.381 (ref 7.250–7.430)
pH, Ven: 7.368 (ref 7.250–7.430)
pO2, Ven: 34 mmHg (ref 32.0–45.0)
pO2, Ven: 34 mmHg (ref 32.0–45.0)

## 2020-07-07 LAB — CBC
HCT: 33.1 % — ABNORMAL LOW (ref 39.0–52.0)
Hemoglobin: 9.8 g/dL — ABNORMAL LOW (ref 13.0–17.0)
MCH: 26.6 pg (ref 26.0–34.0)
MCHC: 29.6 g/dL — ABNORMAL LOW (ref 30.0–36.0)
MCV: 89.9 fL (ref 80.0–100.0)
Platelets: 189 10*3/uL (ref 150–400)
RBC: 3.68 MIL/uL — ABNORMAL LOW (ref 4.22–5.81)
RDW: 18.4 % — ABNORMAL HIGH (ref 11.5–15.5)
WBC: 7.1 10*3/uL (ref 4.0–10.5)
nRBC: 0 % (ref 0.0–0.2)

## 2020-07-07 LAB — BASIC METABOLIC PANEL
Anion gap: 9 (ref 5–15)
BUN: 91 mg/dL — ABNORMAL HIGH (ref 8–23)
CO2: 25 mmol/L (ref 22–32)
Calcium: 8.3 mg/dL — ABNORMAL LOW (ref 8.9–10.3)
Chloride: 98 mmol/L (ref 98–111)
Creatinine, Ser: 4.21 mg/dL — ABNORMAL HIGH (ref 0.61–1.24)
GFR, Estimated: 15 mL/min — ABNORMAL LOW (ref >60)
Glucose, Bld: 155 mg/dL — ABNORMAL HIGH (ref 70–99)
Potassium: 4.1 mmol/L (ref 3.5–5.1)
Sodium: 132 mmol/L — ABNORMAL LOW (ref 135–145)

## 2020-07-07 LAB — GLUCOSE, CAPILLARY: Glucose-Capillary: 153 mg/dL — ABNORMAL HIGH (ref 70–99)

## 2020-07-07 SURGERY — RIGHT HEART CATH
Anesthesia: LOCAL

## 2020-07-07 MED ORDER — SODIUM CHLORIDE 0.9% FLUSH: 3.0000 mL | Freq: Two times a day (BID) | INTRAVENOUS | Status: DC

## 2020-07-07 MED ORDER — FUROSEMIDE 10 MG/ML IJ SOLN
INTRAMUSCULAR | Status: DC | PRN
  Administered 2020-07-07: 80 mg via INTRAVENOUS

## 2020-07-07 MED ORDER — LABETALOL HCL 5 MG/ML IV SOLN: 10.0000 mg | INTRAVENOUS | Status: DC | PRN

## 2020-07-07 MED ORDER — SODIUM CHLORIDE 0.9 % IV SOLN: 250.0000 mL | INTRAVENOUS | Status: DC | PRN

## 2020-07-07 MED ORDER — SODIUM CHLORIDE 0.9% FLUSH: 3.0000 mL | INTRAVENOUS | Status: DC | PRN

## 2020-07-07 MED ORDER — SODIUM CHLORIDE 0.9 % IV SOLN: INTRAVENOUS | Status: DC

## 2020-07-07 MED ORDER — FUROSEMIDE 10 MG/ML IJ SOLN
INTRAMUSCULAR | Status: AC
  Filled 2020-07-07: qty 8

## 2020-07-07 MED ORDER — HEPARIN (PORCINE) IN NACL 1000-0.9 UT/500ML-% IV SOLN
INTRAVENOUS | Status: AC
  Filled 2020-07-07: qty 500

## 2020-07-07 MED ORDER — ACETAMINOPHEN 325 MG PO TABS: 650.0000 mg | ORAL_TABLET | ORAL | Status: DC | PRN

## 2020-07-07 MED ORDER — ONDANSETRON HCL 4 MG/2ML IJ SOLN: 4.0000 mg | Freq: Four times a day (QID) | INTRAMUSCULAR | Status: DC | PRN

## 2020-07-07 MED ORDER — HYDRALAZINE HCL 20 MG/ML IJ SOLN: 10.0000 mg | INTRAMUSCULAR | Status: DC | PRN

## 2020-07-07 MED ORDER — HEPARIN (PORCINE) IN NACL 1000-0.9 UT/500ML-% IV SOLN
INTRAVENOUS | Status: DC | PRN
Start: 2020-07-07 — End: 2020-07-07
  Administered 2020-07-07: 500 mL

## 2020-07-07 MED ORDER — LIDOCAINE HCL (PF) 1 % IJ SOLN
INTRAMUSCULAR | Status: DC | PRN
  Administered 2020-07-07: 2 mL

## 2020-07-07 MED ORDER — LIDOCAINE HCL (PF) 1 % IJ SOLN
INTRAMUSCULAR | Status: AC
  Filled 2020-07-07: qty 30

## 2020-07-07 SURGICAL SUPPLY — 7 items
CATH SWAN GANZ 7F STRAIGHT (CATHETERS) ×1 IMPLANT
GLIDESHEATH SLENDER 7FR .021G (SHEATH) ×1 IMPLANT
GUIDEWIRE .025 260CM (WIRE) ×2 IMPLANT
PACK CARDIAC CATHETERIZATION (CUSTOM PROCEDURE TRAY) ×2 IMPLANT
TRANSDUCER W/STOPCOCK (MISCELLANEOUS) ×2 IMPLANT
TUBING ART PRESS 72  MALE/FEM (TUBING) ×2
TUBING ART PRESS 72 MALE/FEM (TUBING) IMPLANT

## 2020-07-07 NOTE — Discharge Instructions (Signed)
Venogram I discussed with Dr. [name] by phone. We will give him lasix 80 IV here in cath lab and metolazone 5mg  x 2 days + double torsemide to 40 bid. He will check labs with PCP next week and f/u with Dr. [name] in 2 weeks. If not diuresing will contact Dr. [name].   Glori Bickers, MD     A venogram, or venography, is a procedure that uses an X-ray and dye (contrast) to examine how well the veins work and how blood flows through them. Contrast helps the veins show up on X-rays. A venogram may be done:  To evaluate abnormalities in the vein.  To identify clots within veins, such as deep vein thrombosis (DVT).  To map out the veins that might be needed for another procedure. Tell a health care provider about:  Any allergies you have, especially to medicines, shellfish, iodine, and contrast.  All medicines you are taking, including vitamins, herbs, eye drops, creams, and over-the-counter medicines.  Any problems you or family members have had with anesthetic medicines.  Any blood disorders you have.  Any surgeries you have had and any complications that occurred.  Any medical conditions you have.  Whether you are pregnant, may be pregnant, or are breastfeeding.  Any history of smoking or tobacco use. What are the risks? Generally, this is a safe procedure. However, problems may occur, including:  Infection.  Bleeding.  Blood clots.  Allergic reaction to medicines or contrast.  Damage to other structures or organs.  Kidney problems.  Increased risk of cancer. Being exposed to too much radiation over a lifetime can increase the risk of cancer. The risk is small. What happens before the procedure? Medicines Ask your health care provider about:  Changing or stopping your regular medicines. This is especially important if you are taking diabetes medicines or blood thinners.  Taking medicines such as aspirin and ibuprofen. These medicines can thin your blood. Do not take  these medicines unless your health care provider tells you to take them.  Taking over-the-counter medicines, vitamins, herbs, and supplements. General instructions  Follow instructions from your health care provider about eating or drinking restrictions.  You may have blood tests to check how well your kidneys and liver are working and how well your blood can clot.  Plan to have someone take you home from the hospital or clinic. What happens during the procedure?  An IV will be inserted into one of your veins.  You may be given a medicine to help you relax (sedative).  You will lie down on an X-ray table. The table may be tilted in different directions during the procedure to help the contrast move throughout your body. Safety straps will keep you secure if the table is tilted.  If veins in your arm or leg will be examined, a band may be wrapped around that arm or leg to keep the veins full of blood. This may cause your arm or leg to feel numb.  The contrast will be injected into your IV. You may have a hot, flushed feeling as it moves throughout your body. You may also have a metallic taste in your mouth. Both of these sensations will go away after the test is complete.  You may be asked to lie in different positions or place your legs or arms in different positions.  At the end of the procedure, you may be given IV fluids to help wash or flush the contrast out of your veins.  The  IV will be removed, and pressure will be applied to the IV site to prevent bleeding. A bandage (dressing) may be applied to the IV site. The exact procedure may vary among health care providers and hospitals.   What can I expect after the procedure?  Your blood pressure, heart rate, breathing rate, and blood oxygen level will be monitored until you leave the hospital or clinic.  You may be given something to eat and drink.  You may have bruising or mild discomfort in the area where the IV was  inserted. Follow these instructions at home: Eating and drinking  Follow instructions from your health care provider about eating or drinking restrictions.  Drink a lot of water for the first several days after the procedure, as directed by your health care provider. This helps to flush the contrast out of your body.   Activity  Rest as told by your health care provider.  Return to your normal activities as told by your health care provider. Ask your health care provider what activities are safe for you.  If you were given a sedative during your procedure, do not drive for 24 hours or until your health care provider approves. General instructions  Check your IV insertion area every day for signs of infection. Check for: ? Redness, swelling, or pain. ? Fluid or blood. ? Warmth. ? Pus or a bad smell.  Take over-the-counter and prescription medicines only as told by your health care provider.  Keep all follow-up visits as told by your health care provider. This is important. Contact a health care provider if:  Your skin becomes itchy or you develop a rash or hives.  You have a fever that does not get better with medicine.  You feel nauseous or you vomit.  You have redness, swelling, or pain around the insertion site.  You have fluid or blood coming from the insertion site.  Your insertion area feels warm to the touch.  You have pus or a bad smell coming from the insertion site. Get help right away if you:  Have shortness of breath or difficulty breathing.  Develop chest pain.  Faint.  Feel very dizzy. These symptoms may represent a serious problem that is an emergency. Do not wait to see if the symptoms will go away. Get medical help right away. Call your local emergency services (911 in the [place] Do not drive yourself to the hospital. Summary  A venogram, or venography, is a procedure that uses an X-ray and contrast dye to check how well the veins work and how blood  flows through them.  An IV will be inserted into one of your veins in order to inject the contrast.  During the exam, you will lie on an X-ray table. The table may be tilted in different directions during the procedure to help the contrast move throughout your body. Safety straps will keep you secure.  After the procedure, you will need to drink a lot of water to help wash or flush the contrast out of your body. This information is not intended to replace advice given to you by your health care provider. Make sure you discuss any questions you have with your health care provider. Document Revised: 09/05/2018 Document Reviewed: 09/05/2018 Elsevier Patient Education  [place].

## 2020-07-07 NOTE — H&P (Signed)
Advanced Heart Failure Team History and Physical Note   PCP:  [name], MD  PCP-Cardiology: [name], MD       HPI:    Travis Blair is a 70 y/o male with h/o CAD, morbid obesity, OSA, PAD and previous DVT/PE.   Recently has had increased SOB. Echo on 06/06/20 with EF 60% RV ok with evidence of PH. RVSP 57 mmHG  Referred by Dr. Acie Fredrickson for [place].   Stable NYHA III dyspnea.   Recent creatinine up from 3.5 -> 4.2   Review of Systems: [y] = yes, [ ]  = no   . General: Weight gain [ ] ; Weight loss [ ] ; Anorexia [ ] ; Fatigue [name] ]; Fever [ ] ; Chills [ ] ; Weakness [ ]   . Cardiac: Chest pain/pressure [ ] ; Resting SOB [ ] ; Exertional SOB [ y]; Orthopnea [ ] ; Pedal Edema [ ] ; Palpitations [ ] ; Syncope [ ] ; Presyncope [ ] ; Paroxysmal nocturnal dyspnea[ ]   . Pulmonary: Cough [name] ]; Wheezing[ ] ; Hemoptysis[ ] ; Sputum [ ] ; Snoring [name] ]  . GI: Vomiting[ ] ; Dysphagia[ ] ; Melena[ ] ; Hematochezia [ ] ; Heartburn[ ] ; Abdominal pain [ ] ; Constipation [ ] ; Diarrhea [ ] ; BRBPR [ ]   . GU: Hematuria[ ] ; Dysuria [ ] ; Nocturia[ ]   . Vascular: Pain in legs with walking [ ] ; Pain in feet with lying flat [ ] ; Non-healing sores [ ] ; Stroke [ ] ; TIA [ ] ; Slurred speech [ ] ;  . Neuro: Headaches[ ] ; Vertigo[ ] ; Seizures[ ] ; Paresthesias[ ] ;Blurred vision [ ] ; Diplopia [ ] ; Vision changes [ ]   . Ortho/Skin: Arthritis [name] ]; Joint pain [name] ]; Muscle pain [ ] ; Joint swelling [ ] ; Back Pain [ ] ; Rash [ ]   . Psych: Depression[ ] ; Anxiety[ ]   . Heme: Bleeding problems [ ] ; Clotting disorders [ ] ; Anemia [ ]   . Endocrine: Diabetes [ y]; Thyroid dysfunction[ ]    Home Medications Prior to Admission medications   Medication Sig Start Date End Date Taking? Authorizing Provider  albuterol (VENTOLIN HFA) 108 (90 Base) MCG/ACT inhaler Inhale 1-2 puffs into the lungs every 6 (six) hours as needed for wheezing or shortness of breath. 07/04/20  Yes [name], MD  amoxicillin-clavulanate (AUGMENTIN) 875-125 MG  tablet Take 1 tablet by mouth 2 (two) times daily. 06/26/20  Yes [name], MD  carvedilol (COREG) 6.25 MG tablet TAKE 1 TABLET (6.25 MG TOTAL) BY MOUTH 2 (TWO) TIMES DAILY WITH A MEAL. 01/12/20  Yes Weaver, Scott T, PA-C  ELIQUIS 5 MG TABS tablet TAKE 1 TABLET BY MOUTH TWICE A DAY Patient taking differently: Take 5 mg by mouth 2 (two) times daily. 04/12/20  Yes Nahser, Wonda Cheng, MD  Evolocumab (REPATHA SURECLICK) 102 MG/ML SOAJ Inject 1 pen into the skin every 14 (fourteen) days. 06/23/20  Yes Nahser, Wonda Cheng, MD  ferrous sulfate (FERROUSUL) 325 (65 FE) MG tablet Take 1 tablet (325 mg total) by mouth daily with breakfast. 01/28/20  Yes [name], MD  fluticasone (FLONASE) 50 MCG/ACT nasal spray Place 2 sprays into both nostrils daily. 05/01/20  Yes [name], MD  guaiFENesin-codeine 100-10 MG/5ML syrup Take 10 mLs by mouth every 6 (six) hours as needed for cough. 07/04/20  Yes [name], MD  metolazone (ZAROXOLYN) 5 MG tablet Take 1 tablet (5 mg total) by mouth daily. Patient taking differently: Take 5 mg by mouth daily as needed (Fluid). 05/19/20  Yes [name], MD  torsemide [facility]) 20  MG tablet Take 2 tablets (40 mg total) by mouth daily. Patient taking differently: Take 80 mg by mouth daily. 05/03/20  Yes [name], MD  azithromycin (ZITHROMAX) 250 MG tablet 2 tabs poqday1, 1 tab poqday 2-5 07/04/20   [name], MD  triamcinolone cream (KENALOG) 0.1 % Apply 1 application topically 2 (two) times daily. 05/23/20   [name], MD    Past Medical History: Past Medical History:  Diagnosis Date  . Anemia associated with chronic renal failure basically  . Asthma    as a child  . CHF (congestive heart failure) ([place])   . Chronic kidney disease    Sees Dr [name]  . Constipation   . Coronary artery disease   . Diabetes mellitus without complication ([place])   . DVT (deep venous thrombosis) ([place])   . History of blood clots   . Hyperlipidemia    . Hypertension   . Leg pain    ABIs/LE Arterial US 03/2019: ABIs unreliable; +calcification, no evidence of stenosis  . Myocardial infarction ([place])   . Obesity   . Pneumonia 2018  . Pulmonary embolism ([place])   . Sleep apnea    CPAP  . SOB (shortness of breath)   . Swelling of both lower extremities     Past Surgical History: Past Surgical History:  Procedure Laterality Date  . AV FISTULA PLACEMENT Left 05/04/2020   Procedure: LEFT ARM ARTERIOVENOUS (AV) FISTULA CREATION;  Surgeon: Rosetta Posner, MD;  Location[facility];  Service: Vascular;  Laterality: Left;  . COLONOSCOPY    . CORONARY ARTERY BYPASS GRAFT N/A 03/07/2017   Procedure: CORONARY ARTERY BYPASS GRAFTING (CABG), X 3 , USING RIGHT INTERNAL MAMMARY ARTERY, AND RIGHT LEG GREATER SAPHENOUS VEIN HARVESTED ENDOSCOPICALLY;  Surgeon: [name], MD;  Location: [facility];  Service: Open Heart Surgery;  Laterality: N/A;  . HAND SURGERY Right 1991  . RIGHT/LEFT HEART CATH AND CORONARY ANGIOGRAPHY N/A 12/20/2016   Procedure: RIGHT/LEFT HEART CATH AND CORONARY ANGIOGRAPHY;  Surgeon: [name], MD;  Location: [facility];  Service: Cardiovascular;  Laterality: N/A;  . TEE WITHOUT CARDIOVERSION N/A 03/07/2017   Procedure: TRANSESOPHAGEAL ECHOCARDIOGRAM (TEE);  Surgeon: [name], [name], MD;  Location: [facility];  Service: Open Heart Surgery;  Laterality: N/A;  . TONSILLECTOMY      Family History:  Family History  Problem Relation Age of Onset  . Heart disease Mother   . Diabetes Mother   . Multiple myeloma Mother   . Cancer Father        type unknown, as a child  . Heart disease Father   . Hypertension Father   . Congenital heart disease Father   . Hyperlipidemia Father   . Lymphoma Sister     Social History: Social History   Socioeconomic History  . Marital status: Married    Spouse name: Not on file  . Number of children: 2  . Years of education: Not on file  . Highest education level: Not on file   Occupational History  . Occupation: general contractor-retired  Tobacco Use  . Smoking status: Never Smoker  . Smokeless tobacco: Never Used  Vaping Use  . Vaping Use: Never used  Substance and Sexual Activity  . Alcohol use: No  . Drug use: No  . Sexual activity: Never  Other Topics Concern  . Not on file  Social History Narrative  . Not on file   Social Determinants of Health   Financial  Resource Strain: Not on file  Food Insecurity: Not on file  Transportation Needs: Not on file  Physical Activity: Not on file  Stress: Not on file  Social Connections: Not on file    Allergies:  Allergies  Allergen Reactions  . Lipitor [Atorvastatin]     Leg aches     Objective:    Vital Signs:   Temp:  [98.1 F (36.7 C)] 98.1 F (36.7 C) (05/27 0912) Pulse Rate:  [73] 73 (05/27 0912) Resp:  [18] 18 (05/27 0912) BP: (145)/(80) 145/80 (05/27 0912) SpO2:  [98 %] 98 % (05/27 0912) Weight:  [132.5 kg] 132.5 kg (05/27 0912)   Filed Weights   07/07/20 0912  Weight: 132.5 kg     Physical Exam     General:  Obese male No respiratory difficulty HEENT: Normal Neck: Supple. Hard to see JVP.Carotids 2+ bilat; no bruits. No lymphadenopathy or thyromegaly appreciated. Cor: PMI nondisplaced. Regular rate & rhythm. No rubs, gallops or murmurs. Lungs: Clear Abdomen: obese Soft, nontender, nondistended. No hepatosplenomegaly. No bruits or masses. Good bowel sounds. Extremities: No cyanosis, clubbing, rash, tr edema Neuro: Alert & oriented x 3, cranial nerves grossly intact. moves all 4 extremities w/o difficulty. Affect pleasant.   Labs     Basic Metabolic Panel: Recent Labs  Lab 07/07/20 1010  NA 132*  K 4.1  CL 98  CO2 25  GLUCOSE 155*  BUN 91*  CREATININE 4.21*  CALCIUM 8.3*    Liver Function Tests: No results for input(s): AST, ALT, ALKPHOS, BILITOT, PROT, ALBUMIN in the last 168 hours. No results for input(s): LIPASE, AMYLASE in the last 168 hours. No results  for input(s): AMMONIA in the last 168 hours.  CBC: Recent Labs  Lab 07/07/20 1010  WBC 7.1  HGB 9.8*  HCT 33.1*  MCV 89.9  PLT 189    Cardiac Enzymes: No results for input(s): CKTOTAL, CKMB, CKMBINDEX, TROPONINI in the last 168 hours.  BNP: BNP (last 3 results) No results for input(s): BNP in the last 8760 hours.  ProBNP (last 3 results) No results for input(s): PROBNP in the last 8760 hours.   CBG: Recent Labs  Lab 07/07/20 0912  GLUCAP 153*    Coagulation Studies: No results for input(s): LABPROT, INR in the last 72 hours.  Imaging:  No results found.   Assessment/Plan   1. Exertional dyspnea with evidence of mild to moderate pulmonary HTN on recent echo - will plan RHC today to further evaluate - suspect PH multifactorial including WHO Group 2 & 3 disease. Also possible to have a component of CTEPH  Though VQ in 11/21 not overly concerning (Intermediate probability for small pulmonary embolism within the RIGHT lower lobe.) - will need weight loss and compliance with CPAP  2. CKD IV - may be nearing need for HD to help manage volume status    Glori Bickers, MD 07/07/2020, 1:02 PM  Advanced Heart Failure Team Pager [phone] (M-F; 7a - 5p)  Please contact [facility] for night-coverage after hours (4p -7a ) and weekends on [email or web address]

## 2020-07-11 ENCOUNTER — Encounter (HOSPITAL_COMMUNITY): Payer: Self-pay | Admitting: Internal Medicine

## 2020-07-12 ENCOUNTER — Encounter (HOSPITAL_COMMUNITY): Payer: PPO

## 2020-07-13 ENCOUNTER — Other Ambulatory Visit: Payer: Self-pay

## 2020-07-13 ENCOUNTER — Encounter (HOSPITAL_COMMUNITY)
Admission: RE | Admit: 2020-07-13 | Discharge: 2020-07-13 | Disposition: A | Discharge status: Home / Self Care | Payer: PPO | Source: Ambulatory Visit | Attending: Nephrology | Admitting: Nephrology

## 2020-07-13 VITALS — BP 123/70 | HR 70 | Resp 16

## 2020-07-13 DIAGNOSIS — N179 Acute kidney failure, unspecified: Secondary | ICD-10-CM | POA: Diagnosis not present

## 2020-07-13 DIAGNOSIS — D631 Anemia in chronic kidney disease: Secondary | ICD-10-CM | POA: Insufficient documentation

## 2020-07-13 DIAGNOSIS — N189 Chronic kidney disease, unspecified: Secondary | ICD-10-CM | POA: Insufficient documentation

## 2020-07-13 LAB — POCT HEMOGLOBIN-HEMACUE: Hemoglobin: 10.1 g/dL — ABNORMAL LOW (ref 13.0–17.0)

## 2020-07-13 MED ORDER — EPOETIN ALFA-EPBX 10000 UNIT/ML IJ SOLN: 20000.0000 [IU] | INTRAMUSCULAR | Status: DC

## 2020-07-13 MED ORDER — EPOETIN ALFA-EPBX 10000 UNIT/ML IJ SOLN
INTRAMUSCULAR | Status: AC
  Administered 2020-07-13: 20000 [IU] via SUBCUTANEOUS
  Filled 2020-07-13: qty 2

## 2020-07-24 ENCOUNTER — Ambulatory Visit
Admission: RE | Admit: 2020-07-24 | Discharge: 2020-07-24 | Disposition: A | Discharge status: Home / Self Care | Payer: PPO | Source: Ambulatory Visit | Attending: Nephrology | Admitting: Nephrology

## 2020-07-24 ENCOUNTER — Other Ambulatory Visit: Payer: Self-pay | Admitting: Nephrology

## 2020-07-24 DIAGNOSIS — R059 Cough, unspecified: Secondary | ICD-10-CM

## 2020-07-26 DIAGNOSIS — N2581 Secondary hyperparathyroidism of renal origin: Secondary | ICD-10-CM | POA: Diagnosis not present

## 2020-07-26 DIAGNOSIS — N184 Chronic kidney disease, stage 4 (severe): Secondary | ICD-10-CM | POA: Diagnosis not present

## 2020-07-26 DIAGNOSIS — E785 Hyperlipidemia, unspecified: Secondary | ICD-10-CM | POA: Diagnosis not present

## 2020-07-26 DIAGNOSIS — E1122 Type 2 diabetes mellitus with diabetic chronic kidney disease: Secondary | ICD-10-CM | POA: Diagnosis not present

## 2020-07-26 DIAGNOSIS — I2510 Atherosclerotic heart disease of native coronary artery without angina pectoris: Secondary | ICD-10-CM | POA: Diagnosis not present

## 2020-07-27 ENCOUNTER — Ambulatory Visit (INDEPENDENT_AMBULATORY_CARE_PROVIDER_SITE_OTHER): Payer: PPO | Admitting: Ophthalmology

## 2020-07-27 ENCOUNTER — Encounter (INDEPENDENT_AMBULATORY_CARE_PROVIDER_SITE_OTHER): Payer: Self-pay | Admitting: Ophthalmology

## 2020-07-27 ENCOUNTER — Encounter (HOSPITAL_COMMUNITY)
Admission: RE | Admit: 2020-07-27 | Discharge: 2020-07-27 | Disposition: A | Discharge status: Home / Self Care | Payer: PPO | Source: Ambulatory Visit | Attending: Nephrology | Admitting: Nephrology

## 2020-07-27 ENCOUNTER — Other Ambulatory Visit: Payer: Self-pay

## 2020-07-27 VITALS — BP 157/73 | HR 70 | Temp 97.7°F | Resp 18

## 2020-07-27 DIAGNOSIS — N179 Acute kidney failure, unspecified: Secondary | ICD-10-CM

## 2020-07-27 DIAGNOSIS — E113412 Type 2 diabetes mellitus with severe nonproliferative diabetic retinopathy with macular edema, left eye: Secondary | ICD-10-CM | POA: Diagnosis not present

## 2020-07-27 DIAGNOSIS — E113411 Type 2 diabetes mellitus with severe nonproliferative diabetic retinopathy with macular edema, right eye: Secondary | ICD-10-CM

## 2020-07-27 LAB — FERRITIN: Ferritin: 20 ng/mL — ABNORMAL LOW (ref 24–336)

## 2020-07-27 LAB — IRON AND TIBC
Iron: 16 ug/dL — ABNORMAL LOW (ref 45–182)
Saturation Ratios: 4 % — ABNORMAL LOW (ref 17.9–39.5)
TIBC: 356 ug/dL (ref 250–450)
UIBC: 340 ug/dL

## 2020-07-27 LAB — POCT HEMOGLOBIN-HEMACUE: Hemoglobin: 8.8 g/dL — ABNORMAL LOW (ref 13.0–17.0)

## 2020-07-27 MED ORDER — EPOETIN ALFA-EPBX 10000 UNIT/ML IJ SOLN
INTRAMUSCULAR | Status: AC
  Administered 2020-07-27: 20000 [IU] via SUBCUTANEOUS
  Filled 2020-07-27: qty 2

## 2020-07-27 MED ORDER — EPOETIN ALFA-EPBX 10000 UNIT/ML IJ SOLN: 20000.0000 [IU] | INTRAMUSCULAR | Status: DC

## 2020-07-27 NOTE — Assessment & Plan Note (Signed)
OD much improved by OCT examination today also follow-up for 3-4

## 2020-07-27 NOTE — Assessment & Plan Note (Signed)
Multifocal CSME with region superior nasal and inferonasal to the FAZ, each of these treated today with focal laser treatment for long-term control

## 2020-07-27 NOTE — Progress Notes (Signed)
07/27/2020     CHIEF COMPLAINT Patient presents for Retina Follow Up (6 Wk Focal OS//Pt denies noticeable changes to [place] OU since last visit. Pt denies ocular pain, flashes of light, or floaters OU. //LBS: 134 this AM)   HISTORY OF PRESENT ILLNESS: Travis Blair is a 70 y.o. male who presents to the clinic today for:   HPI     Retina Follow Up           Diagnosis: Diabetic Retinopathy   Laterality: left eye   Onset: 6 weeks ago   Severity: mild   Duration: 6 weeks   Course: stable   Comments: 6 Wk Focal OS  Pt denies noticeable changes to [place] OU since last visit. Pt denies ocular pain, flashes of light, or floaters OU.   LBS: 134 this AM       Last edited by Milly Jakob, [place] on 07/27/2020  2:45 PM.      Referring physician: [name], MD [address] [facility] [address] [place],  [place] [address]  HISTORICAL INFORMATION:   Selected notes from the MEDICAL RECORD NUMBER    Lab Results  Component Value Date   HGBA1C 6.3 (H) 01/27/2020     CURRENT MEDICATIONS: No current outpatient medications on file. (Ophthalmic Drugs)   No current facility-administered medications for this visit. (Ophthalmic Drugs)   Current Outpatient Medications (Other)  Medication Sig   albuterol (VENTOLIN HFA) 108 (90 Base) MCG/ACT inhaler Inhale 1-2 puffs into the lungs every 6 (six) hours as needed for wheezing or shortness of breath.   amoxicillin-clavulanate (AUGMENTIN) 875-125 MG tablet Take 1 tablet by mouth 2 (two) times daily.   azithromycin (ZITHROMAX) 250 MG tablet 2 tabs poqday1, 1 tab poqday 2-5   carvedilol (COREG) 6.25 MG tablet TAKE 1 TABLET (6.25 MG TOTAL) BY MOUTH 2 (TWO) TIMES DAILY WITH A MEAL.   ELIQUIS 5 MG TABS tablet TAKE 1 TABLET BY MOUTH TWICE A DAY (Patient taking differently: Take 5 mg by mouth 2 (two) times daily.)   Evolocumab (REPATHA SURECLICK) 683 MG/ML SOAJ Inject 1 pen into the skin every 14 (fourteen) days.   ferrous sulfate (FERROUSUL) 325 (65 FE) MG  tablet Take 1 tablet (325 mg total) by mouth daily with breakfast.   fluticasone (FLONASE) 50 MCG/ACT nasal spray Place 2 sprays into both nostrils daily.   guaiFENesin-codeine 100-10 MG/5ML syrup Take 10 mLs by mouth every 6 (six) hours as needed for cough.   metolazone (ZAROXOLYN) 5 MG tablet Take 1 tablet (5 mg total) by mouth daily. (Patient taking differently: Take 5 mg by mouth daily as needed (Fluid).)   torsemide (DEMADEX) 20 MG tablet Take 2 tablets (40 mg total) by mouth daily. (Patient taking differently: Take 80 mg by mouth daily.)   No current facility-administered medications for this visit. (Other)   Facility-Administered Medications Ordered in Other Visits (Other)  Medication Route   epoetin alfa-epbx (RETACRIT) injection 20,000 Units Subcutaneous      REVIEW OF SYSTEMS:    ALLERGIES Allergies  Allergen Reactions   Lipitor [Atorvastatin]     Leg aches     PAST MEDICAL HISTORY Past Medical History:  Diagnosis Date   Anemia associated with chronic renal failure basically   Asthma    as a child   CHF (congestive heart failure) (HCC)    Chronic kidney disease    Sees Dr [name]   Constipation    Coronary artery disease    Diabetes mellitus without complication (  [place])    DVT (deep venous thrombosis) ([place])    History of blood clots    Hyperlipidemia    Hypertension    Leg pain    ABIs/LE Arterial US 03/2019: ABIs unreliable; +calcification, no evidence of stenosis   Myocardial infarction ([place])    Obesity    Pneumonia 2018   Pulmonary embolism (HCC)    Sleep apnea    CPAP   SOB (shortness of breath)    Swelling of both lower extremities    Past Surgical History:  Procedure Laterality Date   AV FISTULA PLACEMENT Left 05/04/2020   Procedure: LEFT ARM ARTERIOVENOUS (AV) FISTULA CREATION;  Surgeon: Rosetta Posner, MD;  Location: [facility];  Service: Vascular;  Laterality: Left;   COLONOSCOPY     CORONARY ARTERY BYPASS GRAFT N/A 03/07/2017   Procedure: CORONARY  ARTERY BYPASS GRAFTING (CABG), X 3 , USING RIGHT INTERNAL MAMMARY ARTERY, AND RIGHT LEG GREATER SAPHENOUS VEIN HARVESTED ENDOSCOPICALLY;  Surgeon: [name], MD;  Location: [facility];  Service: Open Heart Surgery;  Laterality: N/A;   HAND SURGERY Right 1991   RIGHT HEART CATH N/A 07/07/2020   Procedure: RIGHT HEART CATH;  Surgeon: Jolaine Artist, MD;  Location: [place] [facility];  Service: Cardiovascular;  Laterality: N/A;   RIGHT/LEFT HEART CATH AND CORONARY ANGIOGRAPHY N/A 12/20/2016   Procedure: RIGHT/LEFT HEART CATH AND CORONARY ANGIOGRAPHY;  Surgeon: [name], MD;  Location: [place] [facility];  Service: Cardiovascular;  Laterality: N/A;   TEE WITHOUT CARDIOVERSION N/A 03/07/2017   Procedure: TRANSESOPHAGEAL ECHOCARDIOGRAM (TEE);  Surgeon: [name], [name], MD;  Location: [facility];  Service: Open Heart Surgery;  Laterality: N/A;   TONSILLECTOMY      FAMILY HISTORY Family History  Problem Relation Age of Onset   Heart disease Mother    Diabetes Mother    Multiple myeloma Mother    Cancer Father        type unknown, as a child   Heart disease Father    Hypertension Father    Congenital heart disease Father    Hyperlipidemia Father    Lymphoma Sister     SOCIAL HISTORY Social History   Tobacco Use   Smoking status: Never   Smokeless tobacco: Never  Vaping Use   Vaping Use: Never used  Substance Use Topics   Alcohol use: No   Drug use: No         OPHTHALMIC EXAM:  Base Eye Exam     Visual Acuity (ETDRS)       Right Left   Dist cc 20/30 +2 20/20   Dist ph cc 20/25 +2     Correction: Glasses         Tonometry (Tonopen, 2:48 PM)       Right Left   Pressure 15 18         Pupils       Pupils Dark Light Shape React APD   Right PERRL 5 4 Round Slow None   Left PERRL 5 4 Round Slow None         Visual Fields (Counting fingers)       Left Right    Full Full         Extraocular Movement       Right Left    Full Full          Neuro/Psych     Oriented x3: Yes   Mood/Affect: Normal  Dilation     Left eye: 1.0% Mydriacyl, 2.5% Phenylephrine @ 2:48 PM            IMAGING AND PROCEDURES  Imaging and Procedures for 07/27/20  Iron and TIBC      Component Value Flag Ref Range Units Status   Iron 16      45 - 182 ug/dL Final   TIBC 356      250 - 450 ug/dL Final   Saturation Ratios 4      17.9 - 39.5 % Final   UIBC 340       ug/dL Final   Comment:   Performed at [facility], [place] [address], [place], [place] [address]           Ferritin      Component Value Flag Ref Range Units Status   Ferritin 20      24 - 336 ng/mL Final   Comment:   Performed at [facility], [place] [address] [place], [place] [address]           Hemoglobin-hemacue, POC      Component Value Flag Ref Range Units Status   Hemoglobin 8.8      13.0 - 17.0 g/dL Final           Focal Laser - OS - Left Eye       Anesthesia Topical anesthesia was used. Anesthetic medications included Proparacaine 0.5%.   Laser Information The type of laser was diode. Color was yellow. The duration in seconds was 0.05. The spot size was 100 microns. Laser power was 100. Total spots was 189.   Post-op The patient tolerated the procedure well. There were no complications. The patient received written and verbal post procedure care education.   Notes Multifocal areas outside the fovea treated 1 superior to the FAZ, #2 nasal, number 3 inferonasal, and area more superior      OCT, Retina - OU - Both Eyes       Right Eye Quality was good. Scan locations included subfoveal. Central Foveal Thickness: 300. Progression has improved. Findings include vitreomacular adhesion , cystoid macular edema.   Left Eye Quality was good. Scan locations included subfoveal. Central Foveal Thickness: 257. Progression has improved. Findings include vitreomacular adhesion , cystoid macular edema.   Notes Vastly improved CSME  OS still active.  Improved overall yet at 3 months follow-up  CSME OD improved on intravitreal Avastin,  OS, but not here involved CSME multifocal we will treat with focal laser photocoagulation today             ASSESSMENT/PLAN:  Severe nonproliferative diabetic retinopathy of left eye, with macular edema, associated with type 2 diabetes mellitus (HCC) Multifocal CSME with region superior nasal and inferonasal to the FAZ, each of these treated today with focal laser treatment for long-term control  Severe nonproliferative diabetic retinopathy of right eye, with macular edema, associated with type 2 diabetes mellitus ([place]) OD much improved by OCT examination today also follow-up for 3-4     ICD-10-CM   1. Severe nonproliferative diabetic retinopathy of left eye, with macular edema, associated with type 2 diabetes mellitus (HCC)  T24.4628 Focal Laser - OS - Left Eye    OCT, Retina - OU - Both Eyes    2. Severe nonproliferative diabetic retinopathy of right eye, with macular edema, associated with type 2 diabetes mellitus ([place])  E11.3411       1.  Focal laser delivered OS today  in uncomplicated fashion  2.  Dilate examination OU next 3 to 4 months  3.  Ophthalmic Meds Ordered this visit:  No orders of the defined types were placed in this encounter.      Return in about 14 weeks (around 11/02/2020) for DILATE OU, OCT.  There are no Patient Instructions on file for this visit.   Explained the diagnoses, plan, and follow up with the patient and they expressed understanding.  Patient expressed understanding of the importance of proper follow up care.   Clent Demark Channon Brougher M.D. Diseases & Surgery of the Retina and Vitreous [facility]ina [facility] Diabetic [place] 07/27/20     Abbreviations: M myopia (nearsighted); A astigmatism; H hyperopia (farsighted); P presbyopia; Mrx spectacle prescription;  CTL contact lenses; OD right eye; OS left eye; OU both eyes  XT exotropia; ET esotropia;  PEK punctate epithelial keratitis; PEE punctate epithelial erosions; DES dry eye syndrome; MGD meibomian gland dysfunction; ATs artificial tears; PFAT's preservative free artificial tears; [place] nuclear sclerotic cataract; PSC posterior subcapsular cataract; ERM epi-retinal membrane; PVD posterior vitreous detachment; RD retinal detachment; DM diabetes mellitus; DR diabetic retinopathy; NPDR non-proliferative diabetic retinopathy; PDR proliferative diabetic retinopathy; CSME clinically significant macular edema; DME diabetic macular edema; dbh dot blot hemorrhages; CWS cotton wool spot; POAG primary open angle glaucoma; C/D cup-to-disc ratio; HVF humphrey visual field; GVF goldmann visual field; OCT optical coherence tomography; IOP intraocular pressure; BRVO Branch retinal vein occlusion; CRVO central retinal vein occlusion; CRAO central retinal artery occlusion; BRAO branch retinal artery occlusion; RT retinal tear; SB scleral buckle; PPV pars plana vitrectomy; VH Vitreous hemorrhage; PRP panretinal laser photocoagulation; IVK intravitreal kenalog; VMT vitreomacular traction; MH Macular hole;  NVD neovascularization of the disc; NVE neovascularization elsewhere; AREDS age related eye disease study; ARMD age related macular degeneration; POAG primary open angle glaucoma; EBMD epithelial/anterior basement membrane dystrophy; ACIOL anterior chamber intraocular lens; IOL intraocular lens; PCIOL posterior chamber intraocular lens; Phaco/IOL phacoemulsification with intraocular lens placement; [place] photorefractive keratectomy; LASIK laser assisted in situ keratomileusis; HTN hypertension; DM diabetes mellitus; COPD chronic obstructive pulmonary disease

## 2020-08-10 ENCOUNTER — Other Ambulatory Visit: Payer: Self-pay

## 2020-08-10 ENCOUNTER — Encounter (HOSPITAL_COMMUNITY)
Admission: RE | Admit: 2020-08-10 | Discharge: 2020-08-10 | Disposition: A | Discharge status: Home / Self Care | Payer: PPO | Source: Ambulatory Visit | Attending: Nephrology | Admitting: Nephrology

## 2020-08-10 VITALS — BP 173/80 | HR 71 | Temp 97.4°F | Resp 18

## 2020-08-10 DIAGNOSIS — N179 Acute kidney failure, unspecified: Secondary | ICD-10-CM

## 2020-08-10 LAB — POCT HEMOGLOBIN-HEMACUE: Hemoglobin: 8.4 g/dL — ABNORMAL LOW (ref 13.0–17.0)

## 2020-08-10 MED ORDER — EPOETIN ALFA-EPBX 10000 UNIT/ML IJ SOLN
20000.0000 [IU] | INTRAMUSCULAR | Status: DC
  Administered 2020-08-10: 20000 [IU] via SUBCUTANEOUS

## 2020-08-10 MED ORDER — EPOETIN ALFA-EPBX 10000 UNIT/ML IJ SOLN
INTRAMUSCULAR | Status: AC
  Filled 2020-08-10: qty 2

## 2020-08-11 ENCOUNTER — Telehealth: Payer: Self-pay

## 2020-08-11 NOTE — Telephone Encounter (Signed)
Per OV note 08/03/2020 from [facility], pts Torsemide was increased to 60mg  in AM and 40mg  in PM. BMP to be rechecked in 2 weeks.

## 2020-08-15 ENCOUNTER — Encounter: Payer: Self-pay | Admitting: Family Medicine

## 2020-08-18 ENCOUNTER — Encounter: Payer: Self-pay | Admitting: Family Medicine

## 2020-08-18 ENCOUNTER — Ambulatory Visit (INDEPENDENT_AMBULATORY_CARE_PROVIDER_SITE_OTHER): Payer: PPO

## 2020-08-18 ENCOUNTER — Ambulatory Visit (INDEPENDENT_AMBULATORY_CARE_PROVIDER_SITE_OTHER): Payer: PPO | Admitting: Family Medicine

## 2020-08-18 ENCOUNTER — Ambulatory Visit: Payer: PPO | Admitting: Podiatry

## 2020-08-18 ENCOUNTER — Other Ambulatory Visit: Payer: Self-pay

## 2020-08-18 VITALS — BP 144/78 | HR 88 | Temp 98.0°F | Resp 16 | Ht 71.5 in | Wt 285.0 lb

## 2020-08-18 DIAGNOSIS — K922 Gastrointestinal hemorrhage, unspecified: Secondary | ICD-10-CM | POA: Diagnosis not present

## 2020-08-18 DIAGNOSIS — L97511 Non-pressure chronic ulcer of other part of right foot limited to breakdown of skin: Secondary | ICD-10-CM

## 2020-08-18 DIAGNOSIS — E1169 Type 2 diabetes mellitus with other specified complication: Secondary | ICD-10-CM

## 2020-08-18 DIAGNOSIS — I83029 Varicose veins of left lower extremity with ulcer of unspecified site: Secondary | ICD-10-CM

## 2020-08-18 DIAGNOSIS — E11621 Type 2 diabetes mellitus with foot ulcer: Secondary | ICD-10-CM | POA: Diagnosis not present

## 2020-08-18 DIAGNOSIS — I83019 Varicose veins of right lower extremity with ulcer of unspecified site: Secondary | ICD-10-CM

## 2020-08-18 DIAGNOSIS — I509 Heart failure, unspecified: Secondary | ICD-10-CM | POA: Diagnosis not present

## 2020-08-18 DIAGNOSIS — M79671 Pain in right foot: Secondary | ICD-10-CM

## 2020-08-18 DIAGNOSIS — B351 Tinea unguium: Secondary | ICD-10-CM | POA: Diagnosis not present

## 2020-08-18 DIAGNOSIS — I2729 Other secondary pulmonary hypertension: Secondary | ICD-10-CM

## 2020-08-18 MED ORDER — DOXYCYCLINE HYCLATE 100 MG PO TABS: 100.0000 mg | ORAL_TABLET | Freq: Two times a day (BID) | ORAL | 0 refills | Status: DC

## 2020-08-18 MED ORDER — PANTOPRAZOLE SODIUM 40 MG PO TBEC: 40.0000 mg | DELAYED_RELEASE_TABLET | Freq: Every day | ORAL | 3 refills | Status: AC

## 2020-08-18 NOTE — Progress Notes (Signed)
  Subjective:  Patient ID: Travis Blair, male    DOB: 23-Apr-1950,  MRN: [id number]  Chief Complaint  Patient presents with   Blister    Right foot lateral aspect blister 1 day duration. Denies fever/chills/nausea/vomiting.    Foot Injury    Left heel abrasion, unknown duration   Nail Problem    Nail trim 1-5 bilateral   70 y.o. male presents with the above complaint. History confirmed with patient.   Has left leg wrapped due to draining fluid blisters  Objective:  Physical Exam: warm, good capillary refill, nail exam onychomycosis of the toenails, no trophic changes or ulcerative lesions. DP pulses palpable, PT pulses palpable and protective sensation absent Left Foot: HPK left 5th MPJ, hallux medial, flexible hammertoes. Abrasion left plantarmedial heel with fibrotic base, no warmth,erythema,SOI Right Foot: fluctuant bulla right 5th metatarsal head with superficial ulceration - sanguinous drainage noted, heel, Hammertoe rigid 2nd toe, other digits flexible  No images are attached to the encounter.  Assessment:   1. Ulcer of right foot, limited to breakdown of skin ([place])   2. Onychomycosis of multiple toenails with type 2 diabetes mellitus and peripheral angiopathy (HCC)   3. Venous stasis ulcer of left lower leg with edema of left lower leg (HCC)   4. Venous stasis ulcer of right lower leg with edema of right lower leg (HCC)    Plan:  Patient was evaluated and treated and all questions answered.  Onychomycosis, Diabetes and DPN -Patient is diabetic with a qualifying condition for at risk foot care.  Procedure: Nail Debridement Type of Debridement: manual, sharp debridement. Instrumentation: Nail nipper, rotary burr. Number of Nails: 10   Venous leg ulcer legs -These appear to have worsened. -Dressed with multilayer compression dressings today -Discussed importance with f/u with PCP for Venous insufficiency  Procedure: Multilayer Compression dressing Rationale:  venous insufficiency Technique: Unna boot, cast padding, Coban compression dressing applied Disposition: Patient tolerated procedure well.    Ulcer right foot, heel abrasion left -XRs reviewed with patient -Debrided wound -Rx doxycycline -Dispense surgical shoe x2. Medically necessary to offload the ulcerations.  Return in about 1 week (around 08/25/2020) for Right foot ulcer, Venous insufficiency f/u.

## 2020-08-18 NOTE — Progress Notes (Signed)
Subjective:    Patient ID: Travis Blair, male    DOB: [birth year], 70 y.o.   MRN: [id number]  CXR 07/24/20: IMPRESSION: Cardiomegaly with vascular congestion and bibasilar atelectasis or infiltrate.  Small pleural effusions, left greater than right.  Patient is a 70 year old Caucasian male with a history of end-stage chronic kidney disease.  He is yet to start dialysis however he already has a fistula placed.  He also has a history of congestive heart failure, diabetes mellitus, venous thromboembolism and pulmonary embolism on lifelong anticoagulation, severe anemia associated with chronic renal failure as well as pulmonary hypertension.  He called me Tuesday with concerns about his health care.  He has essentially had a cough for several months that will not subside.  The cough is nonproductive.  Chest x-ray in June showed cardiomegaly with vascular congestion and pleural effusions suggesting fluid overload.  Therefore I question if some of the cough may be due to pulmonary edema.  He also question whether he should stop his blood thinner that he takes for prevention of DVT/PE.  His hemoglobin has been dropping and his renal doctor is concerned that there may be an undiagnosed GI bleed.  No visits with results within 1 Week(s) from this visit.  Latest known visit with results is:  Hospital Outpatient Visit on 08/10/2020  Component Date Value Ref Range Status   Hemoglobin 08/10/2020 8.4 (A) 13.0 - 17.0 g/dL Final   Approximately 9 days ago his hemoglobin was 8.4 down from 10.1 on 6/2.  I performed a rectal exam today.  Patient is guaiac positive even on the trace amount of stool that was on my glove.  Therefore there is an undiagnosed GI bleed.  He needs to see gastroenterology.  Given the drop in his hemoglobin therefore he also needs to discontinue Eliquis for his own safety.  He also reports a cough that has been going on for months.  He denies any history of smoking although he did work  in [employer] for over 23 years.  The cough is worse at night when he lies down.  He also has been complaining of acid reflux.  Patient is clearly fluid overloaded today on exam.  He states the cough does improve when he experiences additional diuresis when he uses Zaroxolyn prior to using his Lasix.  He has an appointment next week to discuss dialysis with his nephrologist.  Past Medical History:  Diagnosis Date   Anemia associated with chronic renal failure basically   Asthma    as a child   CHF (congestive heart failure) ([place])    Chronic kidney disease    Sees Dr [name]   Constipation    Coronary artery disease    Diabetes mellitus without complication ([place])    DVT (deep venous thrombosis) ([place])    History of blood clots    Hyperlipidemia    Hypertension    Leg pain    ABIs/LE Arterial US 03/2019: ABIs unreliable; +calcification, no evidence of stenosis   Myocardial infarction [facility])    Obesity    Pneumonia 2018   Pulmonary embolism (HCC)    Sleep apnea    CPAP   SOB (shortness of breath)    Swelling of both lower extremities    Past Surgical History:  Procedure Laterality Date   AV FISTULA PLACEMENT Left 05/04/2020   Procedure: LEFT ARM ARTERIOVENOUS (AV) FISTULA CREATION;  Surgeon: Rosetta Posner, MD;  Location[facility];  Service: Vascular;  Laterality: Left;   COLONOSCOPY  CORONARY ARTERY BYPASS GRAFT N/A 03/07/2017   Procedure: CORONARY ARTERY BYPASS GRAFTING (CABG), X 3 , USING RIGHT INTERNAL MAMMARY ARTERY, AND RIGHT LEG GREATER SAPHENOUS VEIN HARVESTED ENDOSCOPICALLY;  Surgeon: [name], MD;  Location: [facility];  Service: Open Heart Surgery;  Laterality: N/A;   HAND SURGERY Right 1991   RIGHT HEART CATH N/A 07/07/2020   Procedure: RIGHT HEART CATH;  Surgeon: Jolaine Artist, MD;  Location: [place] [facility];  Service: Cardiovascular;  Laterality: N/A;   RIGHT/LEFT HEART CATH AND CORONARY ANGIOGRAPHY N/A 12/20/2016   Procedure: RIGHT/LEFT HEART CATH AND CORONARY  ANGIOGRAPHY;  Surgeon: [name], MD;  Location: [facility];  Service: Cardiovascular;  Laterality: N/A;   TEE WITHOUT CARDIOVERSION N/A 03/07/2017   Procedure: TRANSESOPHAGEAL ECHOCARDIOGRAM (TEE);  Surgeon: [name], [name], MD;  Location: [facility];  Service: Open Heart Surgery;  Laterality: N/A;   TONSILLECTOMY     Current Outpatient Medications on File Prior to Visit  Medication Sig Dispense Refill   albuterol (VENTOLIN HFA) 108 (90 Base) MCG/ACT inhaler Inhale 1-2 puffs into the lungs every 6 (six) hours as needed for wheezing or shortness of breath. 18 g 3   amoxicillin-clavulanate (AUGMENTIN) 875-125 MG tablet Take 1 tablet by mouth 2 (two) times daily. 20 tablet 0   azithromycin (ZITHROMAX) 250 MG tablet 2 tabs poqday1, 1 tab poqday 2-5 6 tablet 0   carvedilol (COREG) 6.25 MG tablet TAKE 1 TABLET (6.25 MG TOTAL) BY MOUTH 2 (TWO) TIMES DAILY WITH A MEAL. 180 tablet 1   ELIQUIS 5 MG TABS tablet TAKE 1 TABLET BY MOUTH TWICE A DAY (Patient taking differently: Take 5 mg by mouth 2 (two) times daily.) 60 tablet 5   Evolocumab (REPATHA SURECLICK) 774 MG/ML SOAJ Inject 1 pen into the skin every 14 (fourteen) days. 6 mL 3   ferrous sulfate (FERROUSUL) 325 (65 FE) MG tablet Take 1 tablet (325 mg total) by mouth daily with breakfast.  3   fluticasone (FLONASE) 50 MCG/ACT nasal spray Place 2 sprays into both nostrils daily. 16 g 6   guaiFENesin-codeine 100-10 MG/5ML syrup Take 10 mLs by mouth every 6 (six) hours as needed for cough. 120 mL 0   metolazone (ZAROXOLYN) 5 MG tablet Take 1 tablet (5 mg total) by mouth daily. (Patient taking differently: Take 5 mg by mouth daily as needed (Fluid).) 30 tablet 1   torsemide (DEMADEX) 20 MG tablet Take 2 tablets (40 mg total) by mouth daily. (Patient taking differently: Take 80 mg by mouth daily.) 180 tablet 3   No current facility-administered medications on file prior to visit.   Allergies  Allergen Reactions   Lipitor [Atorvastatin]      Leg aches    Social History   Socioeconomic History   Marital status: Married    Spouse name: Not on file   Number of children: 2   Years of education: Not on file   Highest education level: Not on file  Occupational History   Occupation: general contractor-retired  Tobacco Use   Smoking status: Never   Smokeless tobacco: Never  Vaping Use   Vaping Use: Never used  Substance and Sexual Activity   Alcohol use: No   Drug use: No   Sexual activity: Never  Other Topics Concern   Not on file  Social History Narrative   Not on file   Social Determinants of Health   Financial Resource Strain: Not on file  Food Insecurity: Not on file  Transportation Needs: Not on file  Physical Activity: Not on file  Stress: Not on file  Social Connections: Not on file  Intimate Partner Violence: Not on file      Review of Systems  Respiratory:  Positive for cough.   All other systems reviewed and are negative.     Objective:   Physical Exam Constitutional:      General: He is not in acute distress.    Appearance: He is obese. He is not ill-appearing or toxic-appearing.  HENT:     Right Ear: Tympanic membrane and ear canal normal.     Left Ear: Tympanic membrane and ear canal normal.     Nose: Congestion and rhinorrhea present.  Cardiovascular:     Rate and Rhythm: Normal rate and regular rhythm.  Pulmonary:     Effort: Pulmonary effort is normal.     Breath sounds: No decreased air movement or transmitted upper airway sounds. Rales present. No decreased breath sounds, wheezing or rhonchi.  Abdominal:     General: Abdomen is flat. Bowel sounds are normal.  Musculoskeletal:     Right lower leg: Edema present.     Left lower leg: Edema present.  Neurological:     Mental Status: He is alert.          Assessment & Plan:  Gastrointestinal hemorrhage, unspecified gastrointestinal hemorrhage type - Plan: CBC with Differential/Platelet  Type 2 diabetes mellitus with other  specified complication, without long-term current use of insulin (HCC) - Plan: Hemoglobin A1c  Pulmonary hypertension associated with unclear multi-factorial mechanisms (HCC)  Acute on chronic congestive heart failure, unspecified heart failure type ([place]) I believe his cough is likely due to a combination of pulmonary edema given the fact that he is obviously fluid overloaded on exam coupled with laryngal esophageal reflux given his heartburn and the exacerbation of the cough when he is supine.  Therefore we need to focus on diuresis.  I have recommended that he uses Zaroxolyn to try to achieve additional diuresis tomorrow morning.  However also recommended that he discuss this with his nephrologist because I do feel that the patient would benefit from dialysis as he remains clearly fluid overloaded on exam.  I will start Protonix 40 mg a day for acid reflux.  I will check a CBC today however he is guaiac positive on physical exam.  Therefore I recommended that he discontinue Eliquis and I will consult GI soon as possible.  If cough does not improve with diuresis and with Protonix, consider pulmonology consultation

## 2020-08-19 LAB — CBC WITH DIFFERENTIAL/PLATELET
Absolute Monocytes: 779 cells/uL (ref 200–950)
Basophils Absolute: 19 cells/uL (ref 0–200)
Basophils Relative: 0.2 %
Eosinophils Absolute: 86 cells/uL (ref 15–500)
Eosinophils Relative: 0.9 %
HCT: 27.2 % — ABNORMAL LOW (ref 38.5–50.0)
Hemoglobin: 8.1 g/dL — ABNORMAL LOW (ref 13.2–17.1)
Lymphs Abs: 846 cells/uL — ABNORMAL LOW (ref 850–3900)
MCH: 24 pg — ABNORMAL LOW (ref 27.0–33.0)
MCHC: 29.8 g/dL — ABNORMAL LOW (ref 32.0–36.0)
MCV: 80.7 fL (ref 80.0–100.0)
MPV: 11.1 fL (ref 7.5–12.5)
Monocytes Relative: 8.2 %
Neutro Abs: 7771 cells/uL (ref 1500–7800)
Neutrophils Relative %: 81.8 %
Platelets: 227 10*3/uL (ref 140–400)
RBC: 3.37 10*6/uL — ABNORMAL LOW (ref 4.20–5.80)
RDW: 15.9 % — ABNORMAL HIGH (ref 11.0–15.0)
Total Lymphocyte: 8.9 %
WBC: 9.5 10*3/uL (ref 3.8–10.8)

## 2020-08-19 LAB — HEMOGLOBIN A1C
Hgb A1c MFr Bld: 8 % of total Hgb — ABNORMAL HIGH (ref <5.7)
Mean Plasma Glucose: 183 mg/dL
eAG (mmol/L): 10.1 mmol/L

## 2020-08-22 ENCOUNTER — Ambulatory Visit: Payer: PPO | Admitting: Podiatry

## 2020-08-23 ENCOUNTER — Other Ambulatory Visit: Payer: Self-pay | Admitting: Podiatry

## 2020-08-23 ENCOUNTER — Other Ambulatory Visit (HOSPITAL_COMMUNITY): Payer: Self-pay

## 2020-08-23 DIAGNOSIS — L97511 Non-pressure chronic ulcer of other part of right foot limited to breakdown of skin: Secondary | ICD-10-CM

## 2020-08-24 ENCOUNTER — Encounter (HOSPITAL_COMMUNITY)
Admission: RE | Admit: 2020-08-24 | Discharge: 2020-08-24 | Disposition: A | Discharge status: Home / Self Care | Payer: PPO | Source: Ambulatory Visit | Attending: Nephrology | Admitting: Nephrology

## 2020-08-24 ENCOUNTER — Encounter: Payer: Self-pay | Admitting: Family Medicine

## 2020-08-24 ENCOUNTER — Other Ambulatory Visit: Payer: Self-pay

## 2020-08-24 VITALS — BP 154/61 | HR 75 | Temp 97.4°F | Resp 18

## 2020-08-24 DIAGNOSIS — N179 Acute kidney failure, unspecified: Secondary | ICD-10-CM

## 2020-08-24 DIAGNOSIS — D631 Anemia in chronic kidney disease: Secondary | ICD-10-CM | POA: Diagnosis not present

## 2020-08-24 DIAGNOSIS — N184 Chronic kidney disease, stage 4 (severe): Secondary | ICD-10-CM | POA: Insufficient documentation

## 2020-08-24 LAB — POCT HEMOGLOBIN-HEMACUE: Hemoglobin: 7.8 g/dL — ABNORMAL LOW (ref 13.0–17.0)

## 2020-08-24 LAB — FERRITIN: Ferritin: 17 ng/mL — ABNORMAL LOW (ref 24–336)

## 2020-08-24 LAB — IRON AND TIBC
Iron: 34 ug/dL — ABNORMAL LOW (ref 45–182)
Saturation Ratios: 9 % — ABNORMAL LOW (ref 17.9–39.5)
TIBC: 367 ug/dL (ref 250–450)
UIBC: 333 ug/dL

## 2020-08-24 MED ORDER — EPOETIN ALFA-EPBX 10000 UNIT/ML IJ SOLN
20000.0000 [IU] | INTRAMUSCULAR | Status: DC
  Administered 2020-08-24: 20000 [IU] via SUBCUTANEOUS

## 2020-08-24 MED ORDER — EPOETIN ALFA-EPBX 10000 UNIT/ML IJ SOLN
INTRAMUSCULAR | Status: AC
  Filled 2020-08-24: qty 2

## 2020-08-24 NOTE — Progress Notes (Signed)
Pt here for retacrit injection.  HGB 7.8 via hemocue.  PT aware that HGB had been dropping. Has seen PCP recently and has been referred to GI physician. Dr [name] notified of today's result.  Instructed to proceed with current orders

## 2020-08-25 ENCOUNTER — Other Ambulatory Visit: Payer: Self-pay

## 2020-08-25 ENCOUNTER — Other Ambulatory Visit: Payer: Self-pay | Admitting: *Deleted

## 2020-08-25 ENCOUNTER — Ambulatory Visit: Payer: PPO | Admitting: Podiatry

## 2020-08-25 DIAGNOSIS — I83029 Varicose veins of left lower extremity with ulcer of unspecified site: Secondary | ICD-10-CM | POA: Diagnosis not present

## 2020-08-25 DIAGNOSIS — D509 Iron deficiency anemia, unspecified: Secondary | ICD-10-CM

## 2020-08-25 DIAGNOSIS — I83891 Varicose veins of right lower extremities with other complications: Secondary | ICD-10-CM | POA: Diagnosis not present

## 2020-08-25 DIAGNOSIS — L97919 Non-pressure chronic ulcer of unspecified part of right lower leg with unspecified severity: Secondary | ICD-10-CM | POA: Diagnosis not present

## 2020-08-25 DIAGNOSIS — R609 Edema, unspecified: Secondary | ICD-10-CM

## 2020-08-25 DIAGNOSIS — I83019 Varicose veins of right lower extremity with ulcer of unspecified site: Secondary | ICD-10-CM | POA: Diagnosis not present

## 2020-08-25 DIAGNOSIS — L97929 Non-pressure chronic ulcer of unspecified part of left lower leg with unspecified severity: Secondary | ICD-10-CM | POA: Diagnosis not present

## 2020-08-25 DIAGNOSIS — L97511 Non-pressure chronic ulcer of other part of right foot limited to breakdown of skin: Secondary | ICD-10-CM | POA: Diagnosis not present

## 2020-08-25 DIAGNOSIS — I83892 Varicose veins of left lower extremities with other complications: Secondary | ICD-10-CM | POA: Diagnosis not present

## 2020-09-01 ENCOUNTER — Encounter (HOSPITAL_COMMUNITY): Payer: Self-pay | Admitting: Emergency Medicine

## 2020-09-01 ENCOUNTER — Other Ambulatory Visit: Payer: Self-pay

## 2020-09-01 ENCOUNTER — Inpatient Hospital Stay (HOSPITAL_COMMUNITY)
Admission: EM | Admit: 2020-09-01 | Discharge: 2020-09-08 | DRG: 291 | Disposition: A | Discharge status: Home Health | Payer: PPO | Attending: Family Medicine | Admitting: Family Medicine

## 2020-09-01 ENCOUNTER — Emergency Department (HOSPITAL_COMMUNITY): Payer: PPO

## 2020-09-01 DIAGNOSIS — I252 Old myocardial infarction: Secondary | ICD-10-CM

## 2020-09-01 DIAGNOSIS — I120 Hypertensive chronic kidney disease with stage 5 chronic kidney disease or end stage renal disease: Secondary | ICD-10-CM | POA: Diagnosis not present

## 2020-09-01 DIAGNOSIS — Z833 Family history of diabetes mellitus: Secondary | ICD-10-CM

## 2020-09-01 DIAGNOSIS — E11621 Type 2 diabetes mellitus with foot ulcer: Secondary | ICD-10-CM | POA: Diagnosis present

## 2020-09-01 DIAGNOSIS — T82510A Breakdown (mechanical) of surgically created arteriovenous fistula, initial encounter: Secondary | ICD-10-CM | POA: Diagnosis present

## 2020-09-01 DIAGNOSIS — Z8249 Family history of ischemic heart disease and other diseases of the circulatory system: Secondary | ICD-10-CM | POA: Diagnosis not present

## 2020-09-01 DIAGNOSIS — D631 Anemia in chronic kidney disease: Secondary | ICD-10-CM | POA: Diagnosis not present

## 2020-09-01 DIAGNOSIS — S40022A Contusion of left upper arm, initial encounter: Secondary | ICD-10-CM | POA: Diagnosis not present

## 2020-09-01 DIAGNOSIS — Y848 Other medical procedures as the cause of abnormal reaction of the patient, or of later complication, without mention of misadventure at the time of the procedure: Secondary | ICD-10-CM | POA: Diagnosis not present

## 2020-09-01 DIAGNOSIS — Z8719 Personal history of other diseases of the digestive system: Secondary | ICD-10-CM

## 2020-09-01 DIAGNOSIS — R392 Extrarenal uremia: Secondary | ICD-10-CM | POA: Diagnosis not present

## 2020-09-01 DIAGNOSIS — I130 Hypertensive heart and chronic kidney disease with heart failure and stage 1 through stage 4 chronic kidney disease, or unspecified chronic kidney disease: Secondary | ICD-10-CM | POA: Diagnosis not present

## 2020-09-01 DIAGNOSIS — N179 Acute kidney failure, unspecified: Secondary | ICD-10-CM

## 2020-09-01 DIAGNOSIS — Z79899 Other long term (current) drug therapy: Secondary | ICD-10-CM

## 2020-09-01 DIAGNOSIS — I272 Pulmonary hypertension, unspecified: Secondary | ICD-10-CM | POA: Diagnosis present

## 2020-09-01 DIAGNOSIS — E1122 Type 2 diabetes mellitus with diabetic chronic kidney disease: Secondary | ICD-10-CM | POA: Diagnosis present

## 2020-09-01 DIAGNOSIS — I132 Hypertensive heart and chronic kidney disease with heart failure and with stage 5 chronic kidney disease, or end stage renal disease: Secondary | ICD-10-CM | POA: Diagnosis not present

## 2020-09-01 DIAGNOSIS — Z20822 Contact with and (suspected) exposure to covid-19: Secondary | ICD-10-CM | POA: Diagnosis present

## 2020-09-01 DIAGNOSIS — I509 Heart failure, unspecified: Secondary | ICD-10-CM | POA: Diagnosis not present

## 2020-09-01 DIAGNOSIS — N186 End stage renal disease: Secondary | ICD-10-CM | POA: Diagnosis present

## 2020-09-01 DIAGNOSIS — Z8616 Personal history of COVID-19: Secondary | ICD-10-CM | POA: Diagnosis not present

## 2020-09-01 DIAGNOSIS — I4891 Unspecified atrial fibrillation: Secondary | ICD-10-CM | POA: Diagnosis present

## 2020-09-01 DIAGNOSIS — I5033 Acute on chronic diastolic (congestive) heart failure: Secondary | ICD-10-CM | POA: Diagnosis not present

## 2020-09-01 DIAGNOSIS — Y712 Prosthetic and other implants, materials and accessory cardiovascular devices associated with adverse incidents: Secondary | ICD-10-CM | POA: Diagnosis present

## 2020-09-01 DIAGNOSIS — E8779 Other fluid overload: Secondary | ICD-10-CM | POA: Diagnosis not present

## 2020-09-01 DIAGNOSIS — R0602 Shortness of breath: Secondary | ICD-10-CM | POA: Diagnosis not present

## 2020-09-01 DIAGNOSIS — N185 Chronic kidney disease, stage 5: Secondary | ICD-10-CM | POA: Diagnosis not present

## 2020-09-01 DIAGNOSIS — E1165 Type 2 diabetes mellitus with hyperglycemia: Secondary | ICD-10-CM | POA: Diagnosis not present

## 2020-09-01 DIAGNOSIS — Z91040 Latex allergy status: Secondary | ICD-10-CM

## 2020-09-01 DIAGNOSIS — N184 Chronic kidney disease, stage 4 (severe): Secondary | ICD-10-CM | POA: Diagnosis not present

## 2020-09-01 DIAGNOSIS — Z807 Family history of other malignant neoplasms of lymphoid, hematopoietic and related tissues: Secondary | ICD-10-CM

## 2020-09-01 DIAGNOSIS — E113413 Type 2 diabetes mellitus with severe nonproliferative diabetic retinopathy with macular edema, bilateral: Secondary | ICD-10-CM | POA: Diagnosis present

## 2020-09-01 DIAGNOSIS — J811 Chronic pulmonary edema: Secondary | ICD-10-CM | POA: Diagnosis not present

## 2020-09-01 DIAGNOSIS — R531 Weakness: Secondary | ICD-10-CM | POA: Diagnosis not present

## 2020-09-01 DIAGNOSIS — Z951 Presence of aortocoronary bypass graft: Secondary | ICD-10-CM | POA: Diagnosis not present

## 2020-09-01 DIAGNOSIS — E785 Hyperlipidemia, unspecified: Secondary | ICD-10-CM | POA: Diagnosis present

## 2020-09-01 DIAGNOSIS — M7989 Other specified soft tissue disorders: Secondary | ICD-10-CM | POA: Diagnosis not present

## 2020-09-01 DIAGNOSIS — Z4901 Encounter for fitting and adjustment of extracorporeal dialysis catheter: Secondary | ICD-10-CM | POA: Diagnosis not present

## 2020-09-01 DIAGNOSIS — N189 Chronic kidney disease, unspecified: Secondary | ICD-10-CM | POA: Diagnosis not present

## 2020-09-01 DIAGNOSIS — Z8349 Family history of other endocrine, nutritional and metabolic diseases: Secondary | ICD-10-CM

## 2020-09-01 DIAGNOSIS — R609 Edema, unspecified: Secondary | ICD-10-CM | POA: Diagnosis not present

## 2020-09-01 DIAGNOSIS — G4733 Obstructive sleep apnea (adult) (pediatric): Secondary | ICD-10-CM | POA: Diagnosis present

## 2020-09-01 DIAGNOSIS — R0789 Other chest pain: Secondary | ICD-10-CM | POA: Diagnosis not present

## 2020-09-01 DIAGNOSIS — J90 Pleural effusion, not elsewhere classified: Secondary | ICD-10-CM | POA: Diagnosis not present

## 2020-09-01 DIAGNOSIS — I2510 Atherosclerotic heart disease of native coronary artery without angina pectoris: Secondary | ICD-10-CM | POA: Diagnosis present

## 2020-09-01 DIAGNOSIS — I5023 Acute on chronic systolic (congestive) heart failure: Secondary | ICD-10-CM | POA: Diagnosis not present

## 2020-09-01 DIAGNOSIS — Z8701 Personal history of pneumonia (recurrent): Secondary | ICD-10-CM

## 2020-09-01 DIAGNOSIS — I10 Essential (primary) hypertension: Secondary | ICD-10-CM | POA: Diagnosis not present

## 2020-09-01 DIAGNOSIS — Z86711 Personal history of pulmonary embolism: Secondary | ICD-10-CM

## 2020-09-01 DIAGNOSIS — E11649 Type 2 diabetes mellitus with hypoglycemia without coma: Secondary | ICD-10-CM | POA: Diagnosis present

## 2020-09-01 DIAGNOSIS — Z888 Allergy status to other drugs, medicaments and biological substances status: Secondary | ICD-10-CM

## 2020-09-01 DIAGNOSIS — Z86718 Personal history of other venous thrombosis and embolism: Secondary | ICD-10-CM | POA: Diagnosis not present

## 2020-09-01 DIAGNOSIS — L97519 Non-pressure chronic ulcer of other part of right foot with unspecified severity: Secondary | ICD-10-CM | POA: Diagnosis present

## 2020-09-01 LAB — COMPREHENSIVE METABOLIC PANEL
ALT: 15 U/L (ref 0–44)
AST: 17 U/L (ref 15–41)
Albumin: 2.5 g/dL — ABNORMAL LOW (ref 3.5–5.0)
Alkaline Phosphatase: 55 U/L (ref 38–126)
Anion gap: 15 (ref 5–15)
BUN: 123 mg/dL — ABNORMAL HIGH (ref 8–23)
CO2: 20 mmol/L — ABNORMAL LOW (ref 22–32)
Calcium: 7.8 mg/dL — ABNORMAL LOW (ref 8.9–10.3)
Chloride: 97 mmol/L — ABNORMAL LOW (ref 98–111)
Creatinine, Ser: 6.16 mg/dL — ABNORMAL HIGH (ref 0.61–1.24)
GFR, Estimated: 9 mL/min — ABNORMAL LOW (ref >60)
Glucose, Bld: 245 mg/dL — ABNORMAL HIGH (ref 70–99)
Potassium: 3.8 mmol/L (ref 3.5–5.1)
Sodium: 132 mmol/L — ABNORMAL LOW (ref 135–145)
Total Bilirubin: 0.8 mg/dL (ref 0.3–1.2)
Total Protein: 6 g/dL — ABNORMAL LOW (ref 6.5–8.1)

## 2020-09-01 LAB — CBC WITH DIFFERENTIAL/PLATELET
Abs Immature Granulocytes: 0.03 10*3/uL (ref 0.00–0.07)
Basophils Absolute: 0 10*3/uL (ref 0.0–0.1)
Basophils Relative: 0 %
Eosinophils Absolute: 0.1 10*3/uL (ref 0.0–0.5)
Eosinophils Relative: 2 %
HCT: 27.6 % — ABNORMAL LOW (ref 39.0–52.0)
Hemoglobin: 8.1 g/dL — ABNORMAL LOW (ref 13.0–17.0)
Immature Granulocytes: 0 %
Lymphocytes Relative: 10 %
Lymphs Abs: 0.8 10*3/uL (ref 0.7–4.0)
MCH: 23.2 pg — ABNORMAL LOW (ref 26.0–34.0)
MCHC: 29.3 g/dL — ABNORMAL LOW (ref 30.0–36.0)
MCV: 79.1 fL — ABNORMAL LOW (ref 80.0–100.0)
Monocytes Absolute: 0.9 10*3/uL (ref 0.1–1.0)
Monocytes Relative: 10 %
Neutro Abs: 6.7 10*3/uL (ref 1.7–7.7)
Neutrophils Relative %: 78 %
Platelets: 252 10*3/uL (ref 150–400)
RBC: 3.49 MIL/uL — ABNORMAL LOW (ref 4.22–5.81)
RDW: 17.1 % — ABNORMAL HIGH (ref 11.5–15.5)
WBC: 8.5 10*3/uL (ref 4.0–10.5)
nRBC: 0 % (ref 0.0–0.2)

## 2020-09-01 LAB — RESP PANEL BY RT-PCR (FLU A&B, COVID) ARPGX2
Influenza A by PCR: NEGATIVE
Influenza B by PCR: NEGATIVE
SARS Coronavirus 2 by RT PCR: NEGATIVE

## 2020-09-01 LAB — BRAIN NATRIURETIC PEPTIDE: B Natriuretic Peptide: 1761.4 pg/mL — ABNORMAL HIGH (ref 0.0–100.0)

## 2020-09-01 LAB — TROPONIN I (HIGH SENSITIVITY)
Troponin I (High Sensitivity): 35 ng/L — ABNORMAL HIGH (ref <18)
Troponin I (High Sensitivity): 38 ng/L — ABNORMAL HIGH (ref <18)

## 2020-09-01 MED ORDER — ACETAMINOPHEN 325 MG PO TABS
650.0000 mg | ORAL_TABLET | Freq: Four times a day (QID) | ORAL | Status: DC | PRN
  Administered 2020-09-05 (×2): 650 mg via ORAL
  Filled 2020-09-01 (×2): qty 2

## 2020-09-01 MED ORDER — FUROSEMIDE 10 MG/ML IJ SOLN
120.0000 mg | Freq: Two times a day (BID) | INTRAVENOUS | Status: DC
  Administered 2020-09-02: 120 mg via INTRAVENOUS
  Filled 2020-09-01: qty 12
  Filled 2020-09-01: qty 10
  Filled 2020-09-01: qty 12

## 2020-09-01 MED ORDER — FUROSEMIDE 10 MG/ML IJ SOLN
120.0000 mg | Freq: Once | INTRAVENOUS | Status: AC
  Administered 2020-09-01: 120 mg via INTRAVENOUS
  Filled 2020-09-01: qty 10

## 2020-09-01 MED ORDER — CARVEDILOL 6.25 MG PO TABS
6.2500 mg | ORAL_TABLET | Freq: Two times a day (BID) | ORAL | Status: DC
  Administered 2020-09-02 – 2020-09-03 (×4): 6.25 mg via ORAL
  Filled 2020-09-01 (×6): qty 1

## 2020-09-01 MED ORDER — ACETAMINOPHEN 650 MG RE SUPP: 650.0000 mg | Freq: Four times a day (QID) | RECTAL | Status: DC | PRN

## 2020-09-01 MED ORDER — HEPARIN SODIUM (PORCINE) 5000 UNIT/ML IJ SOLN
5000.0000 [IU] | Freq: Three times a day (TID) | INTRAMUSCULAR | Status: DC
  Administered 2020-09-02 – 2020-09-08 (×19): 5000 [IU] via SUBCUTANEOUS
  Filled 2020-09-01 (×16): qty 1

## 2020-09-01 NOTE — Progress Notes (Signed)
  Subjective:  Patient ID: Travis Blair, male    DOB: 11-06-1950,  MRN: [id number]  Chief Complaint  Patient presents with   Wound Check    Denies fever/chills/nausea/vomiting. No new concerns.   70 y.o. male presents with the above complaint. History confirmed with patient.   Has left leg wrapped due to draining fluid blisters  Objective:  Physical Exam: warm, good capillary refill, nail exam onychomycosis of the toenails, no trophic changes or ulcerative lesions. DP pulses palpable, PT pulses palpable and protective sensation absent Left Foot: HPK left 5th MPJ, hallux medial, flexible hammertoes. Abrasion left plantarmedial heel with fibrotic base, no warmth,erythema,SOI Right Foot: healing ulcer right 5th met head - no warmth, erythema, SOI. Hammertoe rigid 2nd toe, other digits flexible  No images are attached to the encounter.  Assessment:   1. Venous stasis ulcer of right lower leg with edema of right lower leg (HCC)   2. Venous stasis ulcer of left lower leg with edema of left lower leg (HCC)   3. Ulcer of right foot, limited to breakdown of skin [facility])     Plan:  Patient was evaluated and treated and all questions answered.  Venous leg ulcer legs -Improving. Dressed with multilayer compression wraps today  Procedure: Multilayer Compression dressing Rationale: venous insufficiency Technique: Unna boot, cast padding, Coban compression dressing applied Disposition: Patient tolerated procedure well.    Ulcer right foot, heel abrasion left -Improving. Minimal debridement today.  Return in about 2 weeks (around 09/08/2020) for Wound Care.

## 2020-09-01 NOTE — ED Notes (Signed)
Unsuccessful IV attempt x2.  

## 2020-09-01 NOTE — ED Provider Notes (Signed)
Emergency Medicine Provider Triage Evaluation Note  Travis Blair [facility] , a 70 y.o. male  was evaluated in triage.  Pt complains of generalized weakness, shortness of breath, chest tightness overloaded both.  Drawn a week ago and his hemoglobin is below 8.  HTN General not feeling poorly..  Review of Systems  Positive: See above  Negative: See above  Physical Exam  BP (!) 175/81   Pulse 77   Temp 98.5 F (36.9 C) (Oral)   Resp 20   Ht 5' 11.5" (1.816 m)   Wt 129 kg   SpO2 97%   BMI 39.11 kg/m  Gen:   Awake, no distress   Resp:  Normal effort  MSK:   Moves extremities without difficulty  Other:  Pitting edema   Medical Decision Making  Medically screening exam initiated at 6:19 PM.  Appropriate orders placed.  Travis Blair was informed that the remainder of the evaluation will be completed by another provider, this initial triage assessment does not replace that evaluation, and the importance of remaining in the ED until their evaluation is complete.     Sherrill Raring, PA-C 53/64/68 [id number]    Delora Fuel, MD 22/48/25 [phone]

## 2020-09-01 NOTE — ED Triage Notes (Signed)
Pt endorses feeling ill and worsened the past 2 days. States he has a low hgb with blood drawn last week. Also having chest tightness and leg swelling. Pt has bilateral legs wrapped.

## 2020-09-01 NOTE — ED Provider Notes (Signed)
[facility] EMERGENCY DEPARTMENT Provider Note   CSN: [id number] Arrival date & time: 09/01/20  1806     History Chief Complaint  Patient presents with  . Shortness of Breath  . Weakness    Travis Blair is a 70 y.o. male.  70 year old male with history of CHF, stage IV chronic kidney disease, recently placed fistula with plans for dialysis potentially), DVT/PE, diabetes, CAD.  Patient states that he contracted COVID back in November of last year, developed blood clots in his lungs and has not been well since.  Patient has swelling in his legs which has gotten progressively worse now feels like he is having swelling in his abdomen, he is more short of breath and fatigued than usual with tightness in his chest onset today.  Patient has been to his PCP, cardiology, nephrology within the past few weeks, has had his torsemide increased from 3 tablets to 5 tablets daily without improvement.  Has wrappings to both of his legs and states that his left foot is now painful today and his toes are blanched, known right foot ulcer.  He denies fevers or chills.      Past Medical History:  Diagnosis Date  . Anemia associated with chronic renal failure basically  . Asthma    as a child  . CHF (congestive heart failure) ([place])   . Chronic kidney disease    Sees Dr [name]  . Constipation   . Coronary artery disease   . Diabetes mellitus without complication ([place])   . DVT (deep venous thrombosis) ([place])   . History of blood clots   . Hyperlipidemia   . Hypertension   . Leg pain    ABIs/LE Arterial US 03/2019: ABIs unreliable; +calcification, no evidence of stenosis  . Myocardial infarction ([place])   . Obesity   . Pneumonia 2018  . Pulmonary embolism ([place])   . Sleep apnea    CPAP  . SOB (shortness of breath)   . Swelling of both lower extremities     Patient Active Problem List   Diagnosis Date Noted  . Acute CHF (congestive heart failure) ([place]) 09/01/2020  .  Hypertensive retinopathy of both eyes 02/03/2020  . Sleep apnea 01/20/2020  . Severe nonproliferative diabetic retinopathy of right eye, with macular edema, associated with type 2 diabetes mellitus ([place]) 01/20/2020  . Severe nonproliferative diabetic retinopathy of left eye, with macular edema, associated with type 2 diabetes mellitus ([place]) 01/20/2020  . Vitreomacular adhesion of left eye 01/20/2020  . Vitreomacular adhesion of right eye 01/20/2020  . Statin myopathy 12/06/2019  . Hammer toe of right foot 12/25/2018  . Deformity of metatarsal bone of right foot 12/25/2018  . Coronary artery disease involving native coronary artery of native heart without angina pectoris 07/27/2018  . Essential hypertension 07/27/2018  . S/P CABG x 3 03/07/2017  . Abnormal stress test 12/18/2016  . Right atrial dilatation 11/17/2016  . Right ventricular dilation 11/17/2016  . Near syncope 11/16/2016  . Uncontrolled type 2 diabetes mellitus with hyperglycemia, without long-term current use of insulin ([place]) 11/16/2016  . Type 2 diabetes mellitus with hyperlipidemia ([place]) 11/16/2016  . Hyperlipidemia, unspecified 11/16/2016  . Obesity 11/16/2016  . Hyponatremia 11/16/2016  . AKI (acute kidney injury) ([place]) 11/16/2016    Past Surgical History:  Procedure Laterality Date  . AV FISTULA PLACEMENT Left 05/04/2020   Procedure: LEFT ARM ARTERIOVENOUS (AV) FISTULA CREATION;  Surgeon: Rosetta Posner, MD;  Location: [facility];  Service: Vascular;  Laterality: Left;  . COLONOSCOPY    . CORONARY ARTERY BYPASS GRAFT N/A 03/07/2017   Procedure: CORONARY ARTERY BYPASS GRAFTING (CABG), X 3 , USING RIGHT INTERNAL MAMMARY ARTERY, AND RIGHT LEG GREATER SAPHENOUS VEIN HARVESTED ENDOSCOPICALLY;  Surgeon: [name], MD;  Location: [place];  Service: Open Heart Surgery;  Laterality: N/A;  . HAND SURGERY Right 1991  . RIGHT HEART CATH N/A 07/07/2020   Procedure: RIGHT HEART CATH;  Surgeon: Jolaine Artist, MD;  Location: [place] [facility];  Service: Cardiovascular;  Laterality: N/A;  . RIGHT/LEFT HEART CATH AND CORONARY ANGIOGRAPHY N/A 12/20/2016   Procedure: RIGHT/LEFT HEART CATH AND CORONARY ANGIOGRAPHY;  Surgeon: [name], MD;  Location: [place] [facility];  Service: Cardiovascular;  Laterality: N/A;  . TEE WITHOUT CARDIOVERSION N/A 03/07/2017   Procedure: TRANSESOPHAGEAL ECHOCARDIOGRAM (TEE);  Surgeon: [name], [name], MD;  Location: [place];  Service: Open Heart Surgery;  Laterality: N/A;  . TONSILLECTOMY         Family History  Problem Relation Age of Onset  . Heart disease Mother   . Diabetes Mother   . Multiple myeloma Mother   . Cancer Father        type unknown, as a child  . Heart disease Father   . Hypertension Father   . Congenital heart disease Father   . Hyperlipidemia Father   . Lymphoma Sister     Social History   Tobacco Use  . Smoking status: Never  . Smokeless tobacco: Never  Vaping Use  . Vaping Use: Never used  Substance Use Topics  . Alcohol use: No  . Drug use: No    Home Medications Prior to Admission medications   Medication Sig Start Date End Date Taking? Authorizing Provider  albuterol (VENTOLIN HFA) 108 (90 Base) MCG/ACT inhaler Inhale 1-2 puffs into the lungs every 6 (six) hours as needed for wheezing or shortness of breath. 07/04/20   [name], MD  carvedilol (COREG) 6.25 MG tablet TAKE 1 TABLET (6.25 MG TOTAL) BY MOUTH 2 (TWO) TIMES DAILY WITH A MEAL. 01/12/20   Richardson Dopp T, PA-C  doxycycline (VIBRA-TABS) 100 MG tablet Take 1 tablet (100 mg total) by mouth 2 (two) times daily. 08/18/20   Evelina Bucy, DPM  Evolocumab (REPATHA SURECLICK) 585 MG/ML SOAJ Inject 1 pen into the skin every 14 (fourteen) days. 06/23/20   Nahser, Wonda Cheng, MD  ferrous sulfate (FERROUSUL) 325 (65 FE) MG tablet Take 1 tablet (325 mg total) by mouth daily with breakfast. 01/28/20   [name], MD  fluticasone (FLONASE) 50 MCG/ACT nasal spray Place 2 sprays  into both nostrils daily. 05/01/20   [name], MD  guaiFENesin-codeine 100-10 MG/5ML syrup Take 10 mLs by mouth every 6 (six) hours as needed for cough. 07/04/20   [name], MD  metolazone (ZAROXOLYN) 5 MG tablet Take 1 tablet (5 mg total) by mouth daily. 05/19/20   [name], MD  pantoprazole (PROTONIX) 40 MG tablet Take 1 tablet (40 mg total) by mouth daily. 08/18/20   [name], MD  torsemide (DEMADEX) 20 MG tablet Take 2 tablets (40 mg total) by mouth daily. Patient taking differently: Take 100 mg by mouth daily. 05/03/20   [name], MD    Allergies    Lipitor [atorvastatin]  Review of Systems   Review of Systems  Constitutional:  Positive for fatigue. Negative for chills and fever.  Respiratory:  Positive for cough  and shortness of breath.   Cardiovascular:  Positive for chest pain and leg swelling.  Gastrointestinal:  Positive for abdominal distention. Negative for abdominal pain, constipation, diarrhea, nausea and vomiting.  Genitourinary:  Positive for frequency. Negative for dysuria.  Musculoskeletal:  Negative for back pain.  Skin:  Positive for wound.  Allergic/Immunologic: Positive for immunocompromised state.  Neurological:  Positive for weakness.  Psychiatric/Behavioral:  Negative for confusion.   All other systems reviewed and are negative.  Physical Exam Updated Vital Signs BP (!) 156/74   Pulse 74   Temp 98.5 F (36.9 C) (Oral)   Resp 20   Ht 5' 11.5" (1.816 m)   Wt 129 kg   SpO2 99%   BMI 39.11 kg/m   Physical Exam Vitals and nursing note reviewed.  Constitutional:      General: He is not in acute distress.    Appearance: He is well-developed. He is not diaphoretic.  HENT:     Head: Normocephalic and atraumatic.  Cardiovascular:     Rate and Rhythm: Normal rate and regular rhythm.  Pulmonary:     Effort: Pulmonary effort is normal.     Breath sounds: Examination of the right-lower field reveals decreased breath  sounds. Examination of the left-lower field reveals decreased breath sounds. Decreased breath sounds present.  Chest:     Chest wall: No tenderness.  Abdominal:     Palpations: Abdomen is soft.     Tenderness: There is no abdominal tenderness.     Comments: Pitting edema to lower abdomen.  Musculoskeletal:     Right lower leg: Edema present.     Left lower leg: Edema present.     Comments: Ulcer to right lateral plantar surface of foot  Skin:    General: Skin is warm and dry.  Neurological:     Mental Status: He is alert and oriented to person, place, and time.  Psychiatric:        Behavior: Behavior normal.    ED Results / Procedures / Treatments   Labs (all labs ordered are listed, but only abnormal results are displayed) Labs Reviewed  COMPREHENSIVE METABOLIC PANEL - Abnormal; Notable for the following components:      Result Value   Sodium 132 (*)    Chloride 97 (*)    CO2 20 (*)    Glucose, Bld 245 (*)    BUN 123 (*)    Creatinine, Ser 6.16 (*)    Calcium 7.8 (*)    Total Protein 6.0 (*)    Albumin 2.5 (*)    GFR, Estimated 9 (*)    All other components within normal limits  BRAIN NATRIURETIC PEPTIDE - Abnormal; Notable for the following components:   B Natriuretic Peptide 1,761.4 (*)    All other components within normal limits  CBC WITH DIFFERENTIAL/PLATELET - Abnormal; Notable for the following components:   RBC 3.49 (*)    Hemoglobin 8.1 (*)    HCT 27.6 (*)    MCV 79.1 (*)    MCH 23.2 (*)    MCHC 29.3 (*)    RDW 17.1 (*)    All other components within normal limits  TROPONIN I (HIGH SENSITIVITY) - Abnormal; Notable for the following components:   Troponin I (High Sensitivity) 35 (*)    All other components within normal limits  RESP PANEL BY RT-PCR (FLU A&B, COVID) [id number]  TROPONIN I (HIGH SENSITIVITY)    EKG EKG Interpretation  Date/Time:  Friday September 01 2020 18:13:10 EDT Ventricular  Rate:  81 PR Interval:    QRS Duration: 112 QT  Interval:  398 QTC Calculation: 462 R Axis:   119 Text Interpretation: Atrial fibrillation with premature ventricular or aberrantly conducted complexes Left posterior fascicular block Cannot rule out Inferior infarct , age undetermined Cannot rule out Anterior infarct , age undetermined new T wave abnormality, consider lateral ischemia Confirmed by Blanchie Dessert [phone]) on 09/01/2020 8:55:39 PM  Radiology DG Chest 2 View  Result Date: 09/01/2020 CLINICAL DATA:  Shortness of breath. Pt endorses feeling ill and worsened the past 2 days. States he has a low hgb with blood drawn last week. Also having chest tightness and leg swelling. Pt has bilateral legs wrapped. EXAM: CHEST - 2 VIEW COMPARISON:  Chest x-ray 07/24/2020 FINDINGS: The heart size and mediastinal contours are unchanged. Aortic calcification. Cardiac surgical changes overlie the mediastinum. Hilar vasculature prominence. Bilateral lower lobe hazy airspace opacities. Mild pulmonary edema. Persistent bilateral at least trace volume pleural effusions, left greater than right. No pneumothorax. No acute osseous abnormality. IMPRESSION: 1. Mild pulmonary edema with persistent bilateral at least trace volume pleural effusions, left greater than right. 2. ASsociatedbilateral lower lobe hazy airspace opacities that could represent a combination of atelectasis versus infection/inflammation. Electronically Signed   By: Iven Finn M.D.   On: 09/01/2020 18:47    Procedures .Critical Care  Date/Time: 09/01/2020 10:08 PM Performed by: Tacy Learn, PA-C Authorized by: Tacy Learn, PA-C   Critical care provider statement:    Critical care time (minutes):  45   Critical care was time spent personally by me on the following activities:  Discussions with consultants, evaluation of patient's response to treatment, examination of patient, ordering and performing treatments and interventions, ordering and review of laboratory studies, ordering  and review of radiographic studies, pulse oximetry, re-evaluation of patient's condition, obtaining history from patient or surrogate and review of old charts   Medications Ordered in ED Medications  furosemide (LASIX) 120 mg in dextrose 5 % 50 mL IVPB (has no administration in time range)    ED Course  I have reviewed the triage vital signs and the nursing notes.  Pertinent labs & imaging results that were available during my care of the patient were reviewed by me and considered in my medical decision making (see chart for details).  Clinical Course as of 09/01/20 2215  Fri Sep 02, 8018  2549 70 year old male with history as above with complaint of shortness of breath, worsening swelling extending now to his abdomen, not improving with increase in his torsemide also now with chest tightness.  Coban wrapping to bilateral lower legs, toes on left foot are blanched reports pain in the left foot.  The Coban was removed from both legs, pain resolved and the foot,color to toes improved.  CMP with increased creatinine, previous 4, now 6, potassium normal at 3.8.  BNP 1,761, Troponin 35 (repeat pending). Patient was seen by Dr. Maryan Rued, ER attending, plan is for nephrology consult with hospitalist admission.  Call to Dr. Moshe Cipro with nephrology who requests hospitalist admit, Dr. Moshe Cipro will see the patient in the morning.  [LM]  2201 CBC returns, hemoglobin 8.1, no significant change from prior.  Hospitalist paged for consult for admission. [LM]  2207 Case discussed with Dr. Hal Hope with [facility] hospitalist service who requests Lasix 120 mg IV, will consult for admission. [LM]    Clinical Course User Index [LM] Roque Lias   MDM Rules/Calculators/A&P  Final Clinical Impression(s) / ED Diagnoses Final diagnoses:  Acute on chronic congestive heart failure, unspecified heart failure type ([place])  Chronic kidney disease, unspecified CKD stage   AKI (acute kidney injury) [facility])    Rx / DC Orders ED Discharge Orders     None        Roque Lias 09/01/20 2215    Blanchie Dessert, MD 09/01/20 2335

## 2020-09-01 NOTE — H&P (Signed)
History and Physical    Travis Blair WVP:[id number] DOB: 12/13/1950 DOA: 09/01/2020  PCP: [name], MD  Patient coming from: Home.  Chief Complaint: Shortness of breath.  HPI: Travis Blair is a 70 y.o. male with history of CAD status post CABG, chronic disease stage V he has an AV fistula on his left, CHF with pulmonary vascular hypertension, diabetes mellitus presently not on medication sleep apnea chronic anemia presents to the ER because of worsening shortness of breath.  Patient states over the last 1 patient has been getting increasingly short of breath with exertion and peripheral edema.  His legs have started to weep with fluid.  Usually takes 3 tablets of 20 mg torsemide which increased to 5 tablets over the last 1 month despite which patient has been getting progressively short of breath.  At this point he decided to come to the ER.  Has no chest pain has some nonproductive cough.  ED Course: In the ER on exam patient has significant edema of the lower extremity extending up to the abdomen.  Chest x-ray shows edema.  Hemoglobin is around 8.1 which is at the around baseline.  Creatinine has increased from 4.2 in Jul 07, 2020 it is around 6.16.  ER physician discussed with on-call nephrologist Dr. Moshe Cipro will be seeing patient in consult.  Given his worsening shortness of breath patient has been given Lasix 120 mg IV admitted for further management.  COVID test is negative.  EKG shows A. fib rate controlled.  Review of Systems: As per HPI, rest all negative.   Past Medical History:  Diagnosis Date   Anemia associated with chronic renal failure basically   Asthma    as a child   CHF (congestive heart failure) ([place])    Chronic kidney disease    Sees Dr [name]   Constipation    Coronary artery disease    Diabetes mellitus without complication ([place])    DVT (deep venous thrombosis) ([place])    History of blood clots    Hyperlipidemia    Hypertension    Leg  pain    ABIs/LE Arterial US 03/2019: ABIs unreliable; +calcification, no evidence of stenosis   Myocardial infarction ([place])    Obesity    Pneumonia 2018   Pulmonary embolism (HCC)    Sleep apnea    CPAP   SOB (shortness of breath)    Swelling of both lower extremities     Past Surgical History:  Procedure Laterality Date   AV FISTULA PLACEMENT Left 05/04/2020   Procedure: LEFT ARM ARTERIOVENOUS (AV) FISTULA CREATION;  Surgeon: Rosetta Posner, MD;  Location: [facility];  Service: Vascular;  Laterality: Left;   COLONOSCOPY     CORONARY ARTERY BYPASS GRAFT N/A 03/07/2017   Procedure: CORONARY ARTERY BYPASS GRAFTING (CABG), X 3 , USING RIGHT INTERNAL MAMMARY ARTERY, AND RIGHT LEG GREATER SAPHENOUS VEIN HARVESTED ENDOSCOPICALLY;  Surgeon: [name], MD;  Location: [facility];  Service: Open Heart Surgery;  Laterality: N/A;   HAND SURGERY Right 1991   RIGHT HEART CATH N/A 07/07/2020   Procedure: RIGHT HEART CATH;  Surgeon: Jolaine Artist, MD;  Location: [facility];  Service: Cardiovascular;  Laterality: N/A;   RIGHT/LEFT HEART CATH AND CORONARY ANGIOGRAPHY N/A 12/20/2016   Procedure: RIGHT/LEFT HEART CATH AND CORONARY ANGIOGRAPHY;  Surgeon: [name], MD;  Location: [facility];  Service: Cardiovascular;  Laterality: N/A;   TEE WITHOUT CARDIOVERSION N/A 03/07/2017   Procedure:  TRANSESOPHAGEAL ECHOCARDIOGRAM (TEE);  Surgeon: [name], [name], MD;  Location: [facility];  Service: Open Heart Surgery;  Laterality: N/A;   TONSILLECTOMY       reports that he has never smoked. He has never used smokeless tobacco. He reports that he does not drink alcohol and does not use drugs.  Allergies  Allergen Reactions   Lipitor [Atorvastatin]     Leg aches     Family History  Problem Relation Age of Onset   Heart disease Mother    Diabetes Mother    Multiple myeloma Mother    Cancer Father        type unknown, as a child   Heart disease Father    Hypertension Father     Congenital heart disease Father    Hyperlipidemia Father    Lymphoma Sister     Prior to Admission medications   Medication Sig Start Date End Date Taking? Authorizing Provider  albuterol (VENTOLIN HFA) 108 (90 Base) MCG/ACT inhaler Inhale 1-2 puffs into the lungs every 6 (six) hours as needed for wheezing or shortness of breath. 07/04/20   [name], MD  carvedilol (COREG) 6.25 MG tablet TAKE 1 TABLET (6.25 MG TOTAL) BY MOUTH 2 (TWO) TIMES DAILY WITH A MEAL. 01/12/20   Richardson Dopp T, PA-C  doxycycline (VIBRA-TABS) 100 MG tablet Take 1 tablet (100 mg total) by mouth 2 (two) times daily. 08/18/20   Evelina Bucy, DPM  Evolocumab (REPATHA SURECLICK) 157 MG/ML SOAJ Inject 1 pen into the skin every 14 (fourteen) days. 06/23/20   Nahser, Wonda Cheng, MD  ferrous sulfate (FERROUSUL) 325 (65 FE) MG tablet Take 1 tablet (325 mg total) by mouth daily with breakfast. 01/28/20   [name], MD  fluticasone (FLONASE) 50 MCG/ACT nasal spray Place 2 sprays into both nostrils daily. 05/01/20   [name], MD  guaiFENesin-codeine 100-10 MG/5ML syrup Take 10 mLs by mouth every 6 (six) hours as needed for cough. 07/04/20   [name], MD  metolazone (ZAROXOLYN) 5 MG tablet Take 1 tablet (5 mg total) by mouth daily. 05/19/20   [name], MD  pantoprazole (PROTONIX) 40 MG tablet Take 1 tablet (40 mg total) by mouth daily. 08/18/20   [name], MD  torsemide (DEMADEX) 20 MG tablet Take 2 tablets (40 mg total) by mouth daily. Patient taking differently: Take 100 mg by mouth daily. 05/03/20   [name], MD    Physical Exam: Constitutional: Moderately built and nourished. Vitals:   09/01/20 2200 09/01/20 2245 09/01/20 2300 09/01/20 2315  BP: (!) 156/74 (!) 146/83 (!) 166/77 (!) 114/57  Pulse: 74 80 80 82  Resp: 20 (!) 27 19 (!) 22  Temp:      TempSrc:      SpO2: 99% 97% 97% 100%  Weight:      Height:       Eyes: Anicteric no pallor. ENMT: No discharge from the  ears eyes nose and mouth. Neck: JVD elevated no mass felt. Respiratory: No rhonchi or crepitations. Cardiovascular: S1-S2 heard. Abdomen: Distended nontender bowel sounds present. Musculoskeletal: Bilateral lower extremity edema extending up to the abdomen. Skin: Chronic skin changes to lower extremity. Neurologic: Alert awake oriented time place and person.  Moves all extremities. Psychiatric: Appears normal.  Normal affect.   Labs on Admission: I have personally reviewed following labs and imaging studies  CBC: Recent Labs  Lab 09/01/20 2140  WBC 8.5  NEUTROABS 6.7  HGB 8.1*  HCT 27.6*  MCV 79.1*  PLT 756   Basic Metabolic Panel: Recent Labs  Lab 09/01/20 1819  NA 132*  K 3.8  CL 97*  CO2 20*  GLUCOSE 245*  BUN 123*  CREATININE 6.16*  CALCIUM 7.8*   GFR: Estimated Creatinine Clearance: 15.6 mL/min (A) (by C-G formula based on SCr of 6.16 mg/dL (H)). Liver Function Tests: Recent Labs  Lab 09/01/20 1819  AST 17  ALT 15  ALKPHOS 55  BILITOT 0.8  PROT 6.0*  ALBUMIN 2.5*   No results for input(s): LIPASE, AMYLASE in the last 168 hours. No results for input(s): AMMONIA in the last 168 hours. Coagulation Profile: No results for input(s): INR, PROTIME in the last 168 hours. Cardiac Enzymes: No results for input(s): CKTOTAL, CKMB, CKMBINDEX, TROPONINI in the last 168 hours. BNP (last 3 results) No results for input(s): PROBNP in the last 8760 hours. HbA1C: No results for input(s): HGBA1C in the last 72 hours. CBG: No results for input(s): GLUCAP in the last 168 hours. Lipid Profile: No results for input(s): CHOL, HDL, LDLCALC, TRIG, CHOLHDL, LDLDIRECT in the last 72 hours. Thyroid Function Tests: No results for input(s): TSH, T4TOTAL, FREET4, T3FREE, THYROIDAB in the last 72 hours. Anemia Panel: No results for input(s): VITAMINB12, FOLATE, FERRITIN, TIBC, IRON, RETICCTPCT in the last 72 hours. Urine analysis:    Component Value Date/Time   COLORURINE  YELLOW 03/10/2017 1553   APPEARANCEUR HAZY (A) 03/10/2017 1553   LABSPEC 1.013 03/10/2017 1553   PHURINE 5.0 03/10/2017 1553   GLUCOSEU NEGATIVE 03/10/2017 1553   HGBUR NEGATIVE 03/10/2017 1553   BILIRUBINUR NEGATIVE 03/10/2017 1553   KETONESUR NEGATIVE 03/10/2017 1553   PROTEINUR 30 (A) 03/10/2017 1553   NITRITE NEGATIVE 03/10/2017 1553   LEUKOCYTESUR NEGATIVE 03/10/2017 1553   Sepsis Labs: _0 (procalcitonin:4,lacticidven:4) ) Recent Results (from the past 240 hour(s))  Resp Panel by RT-PCR (Flu A&B, Covid) Nasopharyngeal Swab     Status: None   Collection Time: 09/01/20  9:06 PM   Specimen: Nasopharyngeal Swab; Nasopharyngeal(NP) swabs in vial transport medium  Result Value Ref Range Status   SARS Coronavirus 2 by RT PCR NEGATIVE NEGATIVE Final    Comment: (NOTE) SARS-CoV-2 target nucleic acids are NOT DETECTED.  The SARS-CoV-2 RNA is generally detectable in upper respiratory specimens during the acute phase of infection. The lowest concentration of SARS-CoV-2 viral copies this assay can detect is 138 copies/mL. A negative result does not preclude SARS-Cov-2 infection and should not be used as the sole basis for treatment or other patient management decisions. A negative result may occur with  improper specimen collection/handling, submission of specimen other than nasopharyngeal swab, presence of viral mutation(s) within the areas targeted by this assay, and inadequate number of viral copies(<138 copies/mL). A negative result must be combined with clinical observations, patient history, and epidemiological information. The expected result is Negative.  Fact Sheet for Patients:  EntrepreneurPulse.com.au  Fact Sheet for Healthcare Providers:  IncredibleEmployment.be  This test is no t yet approved or cleared by the [place] FDA and  has been authorized for detection and/or diagnosis of SARS-CoV-2 by FDA under an Emergency  Use Authorization (EUA). This EUA will remain  in effect (meaning this test can be used) for the duration of the COVID-19 declaration under Section 564(b)(1) of the Act, 21 U.S.C.section 360bbb-3(b)(1), unless the authorization is terminated  or revoked sooner.       Influenza A by PCR NEGATIVE NEGATIVE Final   Influenza B by PCR NEGATIVE NEGATIVE Final  Comment: (NOTE) The Xpert Xpress SARS-CoV-2/FLU/RSV plus assay is intended as an aid in the diagnosis of influenza from Nasopharyngeal swab specimens and should not be used as a sole basis for treatment. Nasal washings and aspirates are unacceptable for Xpert Xpress SARS-CoV-2/FLU/RSV testing.  Fact Sheet for Patients: EntrepreneurPulse.com.au  Fact Sheet for Healthcare Providers: IncredibleEmployment.be  This test is not yet approved or cleared by the [place] FDA and has been authorized for detection and/or diagnosis of SARS-CoV-2 by FDA under an Emergency Use Authorization (EUA). This EUA will remain in effect (meaning this test can be used) for the duration of the COVID-19 declaration under Section 564(b)(1) of the Act, 21 U.S.C. section 360bbb-3(b)(1), unless the authorization is terminated or revoked.  Performed at [facility], [place] [address] [place], [place] [address]      Radiological Exams on Admission: DG Chest 2 View  Result Date: 09/01/2020 CLINICAL DATA:  Shortness of breath. Pt endorses feeling ill and worsened the past 2 days. States he has a low hgb with blood drawn last week. Also having chest tightness and leg swelling. Pt has bilateral legs wrapped. EXAM: CHEST - 2 VIEW COMPARISON:  Chest x-ray 07/24/2020 FINDINGS: The heart size and mediastinal contours are unchanged. Aortic calcification. Cardiac surgical changes overlie the mediastinum. Hilar vasculature prominence. Bilateral lower lobe hazy airspace opacities. Mild pulmonary edema. Persistent bilateral at  least trace volume pleural effusions, left greater than right. No pneumothorax. No acute osseous abnormality. IMPRESSION: 1. Mild pulmonary edema with persistent bilateral at least trace volume pleural effusions, left greater than right. 2. ASsociatedbilateral lower lobe hazy airspace opacities that could represent a combination of atelectasis versus infection/inflammation. Electronically Signed   By: Iven Finn M.D.   On: 09/01/2020 18:47    EKG: Independently reviewed.  A. fib rate controlled.  Assessment/Plan Principal Problem:   Acute on chronic congestive heart failure (HCC) Active Problems:   S/P CABG x 3   Essential hypertension   CKD (chronic kidney disease) stage 5, GFR less than 15 ml/min (HCC)   Acute on chronic diastolic CHF (congestive heart failure) (HCC)    Acute on chronic diastolic CHF with pulmonary venous hypertension with progressive worsening renal function at this time patient has been placed on Lasix 120 mg IV every 12.  Nephrology has been consulted.  Patient may need dialysis if patient does not have significant diuresis.  Closely follow intake output Daily weights and metabolic panel.  We will also notify patient's cardiology team. Acute on chronic kidney disease stage V presently may need dialysis.  See #1. History of diabetes mellitus type 2 presently off medication since patient was having hypoglycemic episodes.  We will follow CBGs closely. History of A. fib presently off anticoagulation due to recurrent GI bleed was taken off anticoagulation last month.  On Coreg for rate control. Sleep apnea on CPAP at bedtime. Chronic anemia likely from renal disease hemoglobin appears to be at baseline. Lower extremity weeping wounds likely from edema. History of CAD status post CABG denies any chest pain.  Since patient has worsening renal function with anasarca CHF will need close monitoring and further interventions and may need dialysis will need inpatient  status.   DVT prophylaxis: Heparin. Code Status: Full code. Family Communication: Patient's wife at the bedside. Disposition Plan: Home. Consults called: ER physician consulted nephrology.  Cardiology notified. Admission status: Inpatient.   Rise Patience MD [facility] Pager [phone].  If 7PM-7AM, please contact night-coverage [email or web address] Password [email or web address]  09/01/2020, 11:20 PM

## 2020-09-02 ENCOUNTER — Inpatient Hospital Stay (HOSPITAL_COMMUNITY): Payer: PPO

## 2020-09-02 DIAGNOSIS — R0602 Shortness of breath: Secondary | ICD-10-CM

## 2020-09-02 DIAGNOSIS — N185 Chronic kidney disease, stage 5: Secondary | ICD-10-CM

## 2020-09-02 DIAGNOSIS — I5033 Acute on chronic diastolic (congestive) heart failure: Secondary | ICD-10-CM

## 2020-09-02 DIAGNOSIS — I10 Essential (primary) hypertension: Secondary | ICD-10-CM

## 2020-09-02 DIAGNOSIS — R609 Edema, unspecified: Secondary | ICD-10-CM | POA: Diagnosis not present

## 2020-09-02 LAB — CBC
HCT: 25.1 % — ABNORMAL LOW (ref 39.0–52.0)
HCT: 26 % — ABNORMAL LOW (ref 39.0–52.0)
Hemoglobin: 7.4 g/dL — ABNORMAL LOW (ref 13.0–17.0)
Hemoglobin: 7.4 g/dL — ABNORMAL LOW (ref 13.0–17.0)
MCH: 23.4 pg — ABNORMAL LOW (ref 26.0–34.0)
MCH: 22.8 pg — ABNORMAL LOW (ref 26.0–34.0)
MCHC: 29.5 g/dL — ABNORMAL LOW (ref 30.0–36.0)
MCHC: 28.5 g/dL — ABNORMAL LOW (ref 30.0–36.0)
MCV: 79.4 fL — ABNORMAL LOW (ref 80.0–100.0)
MCV: 80.2 fL (ref 80.0–100.0)
Platelets: 229 10*3/uL (ref 150–400)
Platelets: 254 10*3/uL (ref 150–400)
RBC: 3.16 MIL/uL — ABNORMAL LOW (ref 4.22–5.81)
RBC: 3.24 MIL/uL — ABNORMAL LOW (ref 4.22–5.81)
RDW: 17.1 % — ABNORMAL HIGH (ref 11.5–15.5)
RDW: 17 % — ABNORMAL HIGH (ref 11.5–15.5)
WBC: 7.8 10*3/uL (ref 4.0–10.5)
WBC: 7 10*3/uL (ref 4.0–10.5)
nRBC: 0 % (ref 0.0–0.2)
nRBC: 0 % (ref 0.0–0.2)

## 2020-09-02 LAB — BASIC METABOLIC PANEL
Anion gap: 12 (ref 5–15)
BUN: 120 mg/dL — ABNORMAL HIGH (ref 8–23)
CO2: 21 mmol/L — ABNORMAL LOW (ref 22–32)
Calcium: 7.9 mg/dL — ABNORMAL LOW (ref 8.9–10.3)
Chloride: 98 mmol/L (ref 98–111)
Creatinine, Ser: 6.15 mg/dL — ABNORMAL HIGH (ref 0.61–1.24)
GFR, Estimated: 9 mL/min — ABNORMAL LOW (ref >60)
Glucose, Bld: 185 mg/dL — ABNORMAL HIGH (ref 70–99)
Potassium: 3.7 mmol/L (ref 3.5–5.1)
Sodium: 131 mmol/L — ABNORMAL LOW (ref 135–145)

## 2020-09-02 LAB — GLUCOSE, CAPILLARY
Glucose-Capillary: 182 mg/dL — ABNORMAL HIGH (ref 70–99)
Glucose-Capillary: 164 mg/dL — ABNORMAL HIGH (ref 70–99)
Glucose-Capillary: 184 mg/dL — ABNORMAL HIGH (ref 70–99)
Glucose-Capillary: 208 mg/dL — ABNORMAL HIGH (ref 70–99)

## 2020-09-02 LAB — HIV ANTIBODY (ROUTINE TESTING W REFLEX): HIV Screen 4th Generation wRfx: NONREACTIVE

## 2020-09-02 LAB — HEPATITIS PANEL, ACUTE
HCV Ab: NONREACTIVE
Hep A IgM: NONREACTIVE
Hep B C IgM: NONREACTIVE
Hepatitis B Surface Ag: NONREACTIVE

## 2020-09-02 LAB — CREATININE, SERUM
Creatinine, Ser: 6.27 mg/dL — ABNORMAL HIGH (ref 0.61–1.24)
GFR, Estimated: 9 mL/min — ABNORMAL LOW (ref >60)

## 2020-09-02 MED ORDER — FUROSEMIDE 10 MG/ML IJ SOLN
120.0000 mg | Freq: Three times a day (TID) | INTRAVENOUS | Status: DC
  Administered 2020-09-02 – 2020-09-03 (×3): 120 mg via INTRAVENOUS
  Filled 2020-09-02 (×2): qty 12
  Filled 2020-09-02 (×2): qty 10
  Filled 2020-09-02: qty 12

## 2020-09-02 MED ORDER — SODIUM CHLORIDE 0.9 % IV SOLN
250.0000 mg | Freq: Every day | INTRAVENOUS | Status: AC
  Administered 2020-09-02 – 2020-09-05 (×4): 250 mg via INTRAVENOUS
  Filled 2020-09-02 (×4): qty 20

## 2020-09-02 MED ORDER — DARBEPOETIN ALFA 200 MCG/0.4ML IJ SOSY: 200.0000 ug | PREFILLED_SYRINGE | INTRAMUSCULAR | Status: DC

## 2020-09-02 MED ORDER — CHLORHEXIDINE GLUCONATE CLOTH 2 % EX PADS
6.0000 | MEDICATED_PAD | Freq: Every day | CUTANEOUS | Status: DC
  Administered 2020-09-02 – 2020-09-08 (×7): 6 via TOPICAL

## 2020-09-02 NOTE — Progress Notes (Signed)
Patient's AVF first time use. Both Arterial and Venous needles accessed successfully with 17 gauge needles with excellent pull and flush but unable to tolerate dialysis machine pressures. Dr. Jonnie Finner notified. Received order to terminate treatment for now.

## 2020-09-02 NOTE — Consult Note (Signed)
[facility] Renal Consultation Note  Requesting MD: Memon Indication for Consultation: A on CRF  HPI: Travis Blair is a 70 y.o. male with past medical history significant for CAD s/p CABG, some pulm HTN and Afib, DM, OSA as well as advanced CKD-  followed by Dr. [name] at [facility]-  has been getting ESA and had a left upper arm AVF placed in March of this year.  He presented to medical attention last night with c/o LE edema, weeping LE's, DOE that has persisted in spite of increasing his OP torsemide and metolazone dosing.  His labs show BUN of 120 - crt over 6 indicating a GFR of 9 and an albumin of 2.5-  reports longstanding dry heaves.  He also has a hgb in the 7's.  I told him that the most efficient way I feel to correct all of his issues would be to initiate hemodialysis -  he agrees   Creat  Date/Time Value Ref Range Status  05/23/2020 02:26 PM 3.49 (H) 0.70 - 1.25 mg/dL Final    Comment:    For patients >94 years of age, the reference limit for Creatinine is approximately 13% higher for people identified as African-American. Marland Kitchen   05/11/2020 08:39 AM 3.25 (H) 0.70 - 1.25 mg/dL Final    Comment:    For patients >44 years of age, the reference limit for Creatinine is approximately 13% higher for people identified as African-American. Marland Kitchen   05/03/2020 11:21 AM 3.25 (H) 0.70 - 1.25 mg/dL Final    Comment:    For patients >81 years of age, the reference limit for Creatinine is approximately 13% higher for people identified as African-American. Marland Kitchen   05/01/2020 11:46 AM 3.22 (H) 0.70 - 1.25 mg/dL Final    Comment:    For patients >25 years of age, the reference limit for Creatinine is approximately 13% higher for people identified as African-American. Marland Kitchen   03/10/2020 12:48 PM 3.16 (H) 0.70 - 1.25 mg/dL Final    Comment:    For patients >86 years of age, the reference limit for Creatinine is approximately 13% higher for people identified as African-American. .    01/27/2020 09:00 AM 3.14 (H) 0.70 - 1.25 mg/dL Final    Comment:    For patients >32 years of age, the reference limit for Creatinine is approximately 13% higher for people identified as African-American. .    Creatinine, Ser  Date/Time Value Ref Range Status  09/02/2020 05:00 AM 6.15 (H) 0.61 - 1.24 mg/dL Final  09/02/2020 12:12 AM 6.27 (H) 0.61 - 1.24 mg/dL Final  09/01/2020 06:19 PM 6.16 (H) 0.61 - 1.24 mg/dL Final  07/07/2020 10:10 AM 4.21 (H) 0.61 - 1.24 mg/dL Final  05/04/2020 08:21 AM 3.40 (H) 0.61 - 1.24 mg/dL Final  09/02/2019 01:31 PM 2.82 (H) 0.76 - 1.27 mg/dL Final  03/23/2019 09:30 AM 2.40 (H) 0.76 - 1.27 mg/dL Final  09/21/2018 07:28 AM 2.38 (H) 0.76 - 1.27 mg/dL Final  07/27/2018 08:10 AM 2.25 (H) 0.76 - 1.27 mg/dL Final  06/30/2018 07:51 AM 1.94 (H) 0.76 - 1.27 mg/dL Final  04/16/2017 08:32 AM 1.69 (H) 0.76 - 1.27 mg/dL Final  03/13/2017 04:21 AM 1.78 (H) 0.61 - 1.24 mg/dL Final  03/12/2017 02:55 AM 2.03 (H) 0.61 - 1.24 mg/dL Final  03/11/2017 03:41 AM 2.19 (H) 0.61 - 1.24 mg/dL Final  03/10/2017 05:13 AM 1.97 (H) 0.61 - 1.24 mg/dL Final  03/09/2017 03:13 AM 1.94 (H) 0.61 - 1.24 mg/dL Final  03/08/2017 04:26  PM 1.50 (H) 0.61 - 1.24 mg/dL Final  03/08/2017 04:10 PM 1.74 (H) 0.61 - 1.24 mg/dL Final  03/08/2017 03:49 AM 1.33 (H) 0.61 - 1.24 mg/dL Final  03/07/2017 07:33 PM 1.30 (H) 0.61 - 1.24 mg/dL Final  03/07/2017 07:16 PM 1.38 (H) 0.61 - 1.24 mg/dL Final  03/07/2017 12:32 PM 1.30 (H) 0.61 - 1.24 mg/dL Final  03/07/2017 11:27 AM 1.10 0.61 - 1.24 mg/dL Final  03/07/2017 10:24 AM 1.20 0.61 - 1.24 mg/dL Final  03/07/2017 10:00 AM 1.30 (H) 0.61 - 1.24 mg/dL Final  03/07/2017 07:55 AM 1.30 (H) 0.61 - 1.24 mg/dL Final  03/04/2017 12:34 PM 1.28 (H) 0.61 - 1.24 mg/dL Final  01/01/2017 09:25 AM 1.54 (H) 0.76 - 1.27 mg/dL Final  11/17/2016 03:20 AM 1.58 (H) 0.61 - 1.24 mg/dL Final  11/16/2016 03:23 PM 1.93 (H) 0.61 - 1.24 mg/dL Final     PMHx:   Past Medical  History:  Diagnosis Date   Anemia associated with chronic renal failure basically   Asthma    as a child   CHF (congestive heart failure) ([place])    Chronic kidney disease    Sees Dr [name]   Constipation    Coronary artery disease    Diabetes mellitus without complication ([place])    DVT (deep venous thrombosis) ([place])    History of blood clots    Hyperlipidemia    Hypertension    Leg pain    ABIs/LE Arterial US 03/2019: ABIs unreliable; +calcification, no evidence of stenosis   Myocardial infarction ([place])    Obesity    Pneumonia 2018   Pulmonary embolism ([facility])    Sleep apnea    CPAP   SOB (shortness of breath)    Swelling of both lower extremities     Past Surgical History:  Procedure Laterality Date   AV FISTULA PLACEMENT Left 05/04/2020   Procedure: LEFT ARM ARTERIOVENOUS (AV) FISTULA CREATION;  Surgeon: Rosetta Posner, MD;  Location: [facility];  Service: Vascular;  Laterality: Left;   COLONOSCOPY     CORONARY ARTERY BYPASS GRAFT N/A 03/07/2017   Procedure: CORONARY ARTERY BYPASS GRAFTING (CABG), X 3 , USING RIGHT INTERNAL MAMMARY ARTERY, AND RIGHT LEG GREATER SAPHENOUS VEIN HARVESTED ENDOSCOPICALLY;  Surgeon: [name], MD;  Location: [facility];  Service: Open Heart Surgery;  Laterality: N/A;   HAND SURGERY Right 1991   RIGHT HEART CATH N/A 07/07/2020   Procedure: RIGHT HEART CATH;  Surgeon: Jolaine Artist, MD;  Location: [facility];  Service: Cardiovascular;  Laterality: N/A;   RIGHT/LEFT HEART CATH AND CORONARY ANGIOGRAPHY N/A 12/20/2016   Procedure: RIGHT/LEFT HEART CATH AND CORONARY ANGIOGRAPHY;  Surgeon: [name], MD;  Location: [facility];  Service: Cardiovascular;  Laterality: N/A;   TEE WITHOUT CARDIOVERSION N/A 03/07/2017   Procedure: TRANSESOPHAGEAL ECHOCARDIOGRAM (TEE);  Surgeon: [name], [name], MD;  Location: [facility];  Service: Open Heart Surgery;  Laterality: N/A;   TONSILLECTOMY      Family Hx:  Family History  Problem Relation Age of  Onset   Heart disease Mother    Diabetes Mother    Multiple myeloma Mother    Cancer Father        type unknown, as a child   Heart disease Father    Hypertension Father    Congenital heart disease Father    Hyperlipidemia Father    Lymphoma Sister     Social History:  reports that he has never smoked. He has never  used smokeless tobacco. He reports that he does not drink alcohol and does not use drugs.  Allergies:  Allergies  Allergen Reactions   Lipitor [Atorvastatin]     Leg aches     Medications: Prior to Admission medications   Medication Sig Start Date End Date Taking? Authorizing Provider  albuterol (VENTOLIN HFA) 108 (90 Base) MCG/ACT inhaler Inhale 1-2 puffs into the lungs every 6 (six) hours as needed for wheezing or shortness of breath. 07/04/20   [name], MD  carvedilol (COREG) 6.25 MG tablet TAKE 1 TABLET (6.25 MG TOTAL) BY MOUTH 2 (TWO) TIMES DAILY WITH A MEAL. 01/12/20   Richardson Dopp T, PA-C  doxycycline (VIBRA-TABS) 100 MG tablet Take 1 tablet (100 mg total) by mouth 2 (two) times daily. 08/18/20   Evelina Bucy, DPM  Evolocumab (REPATHA SURECLICK) 093 MG/ML SOAJ Inject 1 pen into the skin every 14 (fourteen) days. 06/23/20   Nahser, Wonda Cheng, MD  ferrous sulfate (FERROUSUL) 325 (65 FE) MG tablet Take 1 tablet (325 mg total) by mouth daily with breakfast. 01/28/20   [name], MD  fluticasone (FLONASE) 50 MCG/ACT nasal spray Place 2 sprays into both nostrils daily. 05/01/20   [name], MD  guaiFENesin-codeine 100-10 MG/5ML syrup Take 10 mLs by mouth every 6 (six) hours as needed for cough. 07/04/20   [name], MD  metolazone (ZAROXOLYN) 5 MG tablet Take 1 tablet (5 mg total) by mouth daily. 05/19/20   [name], MD  pantoprazole (PROTONIX) 40 MG tablet Take 1 tablet (40 mg total) by mouth daily. 08/18/20   [name], MD  torsemide (DEMADEX) 20 MG tablet Take 2 tablets (40 mg total) by mouth daily. Patient taking  differently: Take 100 mg by mouth daily. 05/03/20   [name], MD    I have reviewed the patient's current medications.  Labs:  Results for orders placed or performed during the hospital encounter of 09/01/20 (from the past 48 hour(s))  Troponin I (High Sensitivity)     Status: Abnormal   Collection Time: 09/01/20  6:19 PM  Result Value Ref Range   Troponin I (High Sensitivity) 35 (H) <18 ng/L    Comment: (NOTE) Elevated high sensitivity troponin I (hsTnI) values and significant  changes across serial measurements may suggest ACS but many other  chronic and acute conditions are known to elevate hsTnI results.  Refer to the Links section for chest pain algorithms and additional  guidance. Performed at [facility], [place] [address] [place], [place] [address]   Comprehensive metabolic panel     Status: Abnormal   Collection Time: 09/01/20  6:19 PM  Result Value Ref Range   Sodium 132 (L) 135 - 145 mmol/L   Potassium 3.8 3.5 - 5.1 mmol/L   Chloride 97 (L) 98 - 111 mmol/L   CO2 20 (L) 22 - 32 mmol/L   Glucose, Bld 245 (H) 70 - 99 mg/dL    Comment: Glucose reference range applies only to samples taken after fasting for at least 8 hours.   BUN 123 (H) 8 - 23 mg/dL   Creatinine, Ser 6.16 (H) 0.61 - 1.24 mg/dL   Calcium 7.8 (L) 8.9 - 10.3 mg/dL   Total Protein 6.0 (L) 6.5 - 8.1 g/dL   Albumin 2.5 (L) 3.5 - 5.0 g/dL   AST 17 15 - 41 U/L   ALT 15 0 - 44 U/L   Alkaline Phosphatase 55 38 - 126 U/L  Total Bilirubin 0.8 0.3 - 1.2 mg/dL   GFR, Estimated 9 (L) >60 mL/min    Comment: (NOTE) Calculated using the CKD-EPI Creatinine Equation (2021)    Anion gap 15 5 - 15    Comment: Performed at [facility] [address] [place], [place] [address]  Brain natriuretic peptide     Status: Abnormal   Collection Time: 09/01/20  6:20 PM  Result Value Ref Range   B Natriuretic Peptide 1,761.4 (H) 0.0 - 100.0 pg/mL    Comment: Performed at [facility]  [address], [place], [place] [address]  Troponin I (High Sensitivity)     Status: Abnormal   Collection Time: 09/01/20  8:19 PM  Result Value Ref Range   Troponin I (High Sensitivity) 38 (H) <18 ng/L    Comment: (NOTE) Elevated high sensitivity troponin I (hsTnI) values and significant  changes across serial measurements may suggest ACS but many other  chronic and acute conditions are known to elevate hsTnI results.  Refer to the "Links" section for chest pain algorithms and additional  guidance. Performed at [facility], [place] [address] [place], [place] [address]   Resp Panel by RT-PCR (Flu A&B, Covid) Nasopharyngeal Swab     Status: None   Collection Time: 09/01/20  9:06 PM   Specimen: Nasopharyngeal Swab; Nasopharyngeal(NP) swabs in vial transport medium  Result Value Ref Range   SARS Coronavirus 2 by RT PCR NEGATIVE NEGATIVE    Comment: (NOTE) SARS-CoV-2 target nucleic acids are NOT DETECTED.  The SARS-CoV-2 RNA is generally detectable in upper respiratory specimens during the acute phase of infection. The lowest concentration of SARS-CoV-2 viral copies this assay can detect is 138 copies/mL. A negative result does not preclude SARS-Cov-2 infection and should not be used as the sole basis for treatment or other patient management decisions. A negative result may occur with  improper specimen collection/handling, submission of specimen other than nasopharyngeal swab, presence of viral mutation(s) within the areas targeted by this assay, and inadequate number of viral copies(<138 copies/mL). A negative result must be combined with clinical observations, patient history, and epidemiological information. The expected result is Negative.  Fact Sheet for Patients:  EntrepreneurPulse.com.au  Fact Sheet for Healthcare Providers:  IncredibleEmployment.be  This test is no t yet approved or cleared by the [place] FDA and  has been authorized  for detection and/or diagnosis of SARS-CoV-2 by FDA under an Emergency Use Authorization (EUA). This EUA will remain  in effect (meaning this test can be used) for the duration of the COVID-19 declaration under Section 564(b)(1) of the Act, 21 U.S.C.section 360bbb-3(b)(1), unless the authorization is terminated  or revoked sooner.       Influenza A by PCR NEGATIVE NEGATIVE   Influenza B by PCR NEGATIVE NEGATIVE    Comment: (NOTE) The Xpert Xpress SARS-CoV-2/FLU/RSV plus assay is intended as an aid in the diagnosis of influenza from Nasopharyngeal swab specimens and should not be used as a sole basis for treatment. Nasal washings and aspirates are unacceptable for Xpert Xpress SARS-CoV-2/FLU/RSV testing.  Fact Sheet for Patients: EntrepreneurPulse.com.au  Fact Sheet for Healthcare Providers: IncredibleEmployment.be  This test is not yet approved or cleared by the [place] FDA and has been authorized for detection and/or diagnosis of SARS-CoV-2 by FDA under an Emergency Use Authorization (EUA). This EUA will remain in effect (meaning this test can be used) for the duration of the COVID-19 declaration under Section 564(b)(1) of the Act, 21  U.S.C. section 360bbb-3(b)(1), unless the authorization is terminated or revoked.  Performed at [facility], [place] [address] [place], [place] [address]   CBC with Differential     Status: Abnormal   Collection Time: 09/01/20  9:40 PM  Result Value Ref Range   WBC 8.5 4.0 - 10.5 K/uL   RBC 3.49 (L) 4.22 - 5.81 MIL/uL   Hemoglobin 8.1 (L) 13.0 - 17.0 g/dL   HCT 27.6 (L) 39.0 - 52.0 %   MCV 79.1 (L) 80.0 - 100.0 fL   MCH 23.2 (L) 26.0 - 34.0 pg   MCHC 29.3 (L) 30.0 - 36.0 g/dL   RDW 17.1 (H) 11.5 - 15.5 %   Platelets 252 150 - 400 K/uL   nRBC 0.0 0.0 - 0.2 %   Neutrophils Relative % 78 %   Neutro Abs 6.7 1.7 - 7.7 K/uL   Lymphocytes Relative 10 %   Lymphs Abs 0.8 0.7 - 4.0 K/uL   Monocytes  Relative 10 %   Monocytes Absolute 0.9 0.1 - 1.0 K/uL   Eosinophils Relative 2 %   Eosinophils Absolute 0.1 0.0 - 0.5 K/uL   Basophils Relative 0 %   Basophils Absolute 0.0 0.0 - 0.1 K/uL   Immature Granulocytes 0 %   Abs Immature Granulocytes 0.03 0.00 - 0.07 K/uL    Comment: Performed at [facility], [place] [address] [place], [place] [address]  HIV Antibody (routine testing w rflx)     Status: None   Collection Time: 09/02/20 12:12 AM  Result Value Ref Range   HIV Screen 4th Generation wRfx Non Reactive Non Reactive    Comment: Performed at [facility], [place] [address] [place], [place] [address]  CBC     Status: Abnormal   Collection Time: 09/02/20 12:12 AM  Result Value Ref Range   WBC 7.8 4.0 - 10.5 K/uL   RBC 3.16 (L) 4.22 - 5.81 MIL/uL   Hemoglobin 7.4 (L) 13.0 - 17.0 g/dL   HCT 25.1 (L) 39.0 - 52.0 %   MCV 79.4 (L) 80.0 - 100.0 fL   MCH 23.4 (L) 26.0 - 34.0 pg   MCHC 29.5 (L) 30.0 - 36.0 g/dL   RDW 17.1 (H) 11.5 - 15.5 %   Platelets 229 150 - 400 K/uL   nRBC 0.0 0.0 - 0.2 %    Comment: Performed at [facility], [place] [address], [place], [place] [address]  Creatinine, serum     Status: Abnormal   Collection Time: 09/02/20 12:12 AM  Result Value Ref Range   Creatinine, Ser 6.27 (H) 0.61 - 1.24 mg/dL   GFR, Estimated 9 (L) >60 mL/min    Comment: (NOTE) Calculated using the CKD-EPI Creatinine Equation (2021) Performed at [facility] [address] [place], [place] [address]   Basic metabolic panel     Status: Abnormal   Collection Time: 09/02/20  5:00 AM  Result Value Ref Range   Sodium 131 (L) 135 - 145 mmol/L   Potassium 3.7 3.5 - 5.1 mmol/L   Chloride 98 98 - 111 mmol/L   CO2 21 (L) 22 - 32 mmol/L   Glucose, Bld 185 (H) 70 - 99 mg/dL    Comment: Glucose reference range applies only to samples taken after fasting for at least 8 hours.   BUN 120 (H) 8 - 23 mg/dL   Creatinine, Ser 6.15 (H) 0.61 - 1.24 mg/dL   Calcium 7.9 (L) 8.9 - 10.3  mg/dL   GFR, Estimated 9 (L) >60 mL/min    Comment: (NOTE) Calculated using the CKD-EPI Creatinine Equation (2021)    Anion gap 12 5 - 15    Comment: Performed at [facility], [place] [address], [place], [place] [address]  CBC     Status: Abnormal   Collection Time: 09/02/20  5:00 AM  Result Value Ref Range   WBC 7.0 4.0 - 10.5 K/uL   RBC 3.24 (L) 4.22 - 5.81 MIL/uL   Hemoglobin 7.4 (L) 13.0 - 17.0 g/dL   HCT 26.0 (L) 39.0 - 52.0 %   MCV 80.2 80.0 - 100.0 fL   MCH 22.8 (L) 26.0 - 34.0 pg   MCHC 28.5 (L) 30.0 - 36.0 g/dL   RDW 17.0 (H) 11.5 - 15.5 %   Platelets 254 150 - 400 K/uL   nRBC 0.0 0.0 - 0.2 %    Comment: Performed at [facility] [address], [place], [place] [address]  Glucose, capillary     Status: Abnormal   Collection Time: 09/02/20  6:17 AM  Result Value Ref Range   Glucose-Capillary 182 (H) 70 - 99 mg/dL    Comment: Glucose reference range applies only to samples taken after fasting for at least 8 hours.   Comment 1 Notify RN    Comment 2 Document in Chart      ROS:  A comprehensive review of systems was negative except for: Respiratory: positive for dyspnea on exertion Cardiovascular: positive for lower extremity edema Gastrointestinal: positive for nausea  Physical Exam: Vitals:   09/02/20 0515 09/02/20 0624  BP: (!) 155/78 (!) 158/77  Pulse: 85 79  Resp:  20  Temp:  97.8 F (36.6 C)  SpO2: 98% 99%     General:  alert, really NAD-  knowledgeable about his medical situation HEENT: PERRLA, EOMI- mucous membranes moist   Neck: positive for JVD Heart: RRR Lungs: dec BS at bases Abdomen: obese, soft, non tender Extremities: both LEs wrapped for weeping- pitting edema Skin: warm and dry Neuro: alert and non focal Left upper arm AVF-  good thrill and bruit   Assessment/Plan: 70 year old WM with cardiac issues as well as advanced CKD-  presents with volume overload, anemia and uremia 1.Renal- advanced CKD at baseline-  have been  prepping for dialysis as OP.  Now with volume overload refractory to meds, and uremia.  Agrees to start dialysis.  Will plan for first treatment as soon as it can be done upstairs-  AVF seems ready for use.  Lives in [place] so can CLIP to [facility] OP unit-  also may be able to eventually do home HD 2. Hypertension/volume  - massive volume overload.  On iv lasix for now, making some urine so will continue-  will do UF with HD also for volume 3. Anemia  - significant and not helping his DOE and weakness-  will load with iron and give ESA 4. Bones-  will check PTH and phos and treat as needed    Louis Meckel 09/02/2020, 9:35 AM

## 2020-09-02 NOTE — Progress Notes (Signed)
VASCULAR LAB    Bilateral lower extremity venous duplex has been performed.  See CV proc for preliminary results.   Danisa Kopec, RVT 09/02/2020, 5:44 PM

## 2020-09-02 NOTE — Progress Notes (Signed)
PROGRESS NOTE    Travis Blair  WNU:[id number] DOB: [birth year] DOA: 09/01/2020 PCP: [name], MD    Brief Narrative:  70 year old male with a history of chronic kidney disease stage V, diastolic heart failure, admitted to the hospital with worsening lower extremity edema and shortness of breath.  He was noted to have worsening renal failure.  He was seen by nephrology with recommendations to initiate dialysis to help manage his volume status.   Assessment & Plan:   Principal Problem:   Acute on chronic congestive heart failure (HCC) Active Problems:   S/P CABG x 3   Essential hypertension   CKD (chronic kidney disease) stage 5, GFR less than 15 ml/min (HCC)   Acute on chronic diastolic CHF (congestive heart failure) (HCC)   Acute on chronic kidney disease stage V -Patient presents with volume overload, elevated creatinine -He already has an AV fistula placed in preparation for dialysis -Seen by nephrology with recommendations to initiate dialysis -CLIP process to be initiated by [facility] -Continued on diuretics since he is still making urine  Acute on chronic diastolic congestive heart failure with pulmonary hypertension -Volume removal to be accomplished through dialysis  Anemia -Suspect a large component from underlying renal disease -Started on iron infusion as well as ESA -Recently, he was noted to have heme positive stools by his PCP, he did not have any evidence of gross GI bleeding -He was to undergo outpatient GI work-up, but was unable to safely perform the studies due to his volume overload and inability to lay flat on the table -Further GI evaluation has been rescheduled for next month -Continue to monitor hemoglobin and transfuse for hemoglobin less than 7  Recent history of right lower lobe PE and left lower extremity DVT diagnosed 12/2019 -Patient was taking Eliquis up until 06/2020 -This was discontinued when he was noted to have heme positive  stools -We will recheck lower extremity Dopplers at this time to evaluate for any residual clot -If patient does have any residual DVT, may need to expedite GI evaluation to see whether anticoagulation is safe versus IVC filter  Atrial fibrillation -Heart rate is currently stable, on Coreg -Currently not on anticoagulation due to heme positive stools  Obstructive sleep apnea -Continue on CPAP  Coronary artery disease status post CABG -No chest pain at this time  Type 2 diabetes -Patient was having hypoglycemic episodes prior to admission -Currently is not on any maintenance medications -Continue to follow CBG    DVT prophylaxis: heparin injection 5,000 Units Start: 09/01/20 2330  Code Status: Full code Family Communication: Discussed with his wife at the bedside Disposition Plan: Status is: Inpatient  Remains inpatient appropriate because:Ongoing diagnostic testing needed not appropriate for outpatient work up, IV treatments appropriate due to intensity of illness or inability to take PO, and Inpatient level of care appropriate due to severity of illness  Dispo: The patient is from: Home              Anticipated d/c is to: Home              Patient currently is not medically stable to d/c.   Difficult to place patient No         Consultants:  Nephrology  Procedures:    Antimicrobials:      Subjective: He says he is feeling better since he has come to the hospital.  He denies any shortness of breath at this time.  Overall nausea is better.  He has  not noted any gross blood in his stool.  Objective: Vitals:   09/02/20 0515 09/02/20 0624 09/02/20 1112 09/02/20 1513  BP: (!) 155/78 (!) 158/77 137/76 (!) 150/81  Pulse: 85 79 68 71  Resp:  20 20 20   Temp:  97.8 F (36.6 C) 97.9 F (36.6 C) 98 F (36.7 C)  TempSrc:  Oral Oral Oral  SpO2: 98% 99% 99% 100%  Weight:  133.8 kg    Height:  6\' 2"  (1.88 m)      Intake/Output Summary (Last 24 hours) at 09/02/2020  1531 Last data filed at 09/02/2020 1411 Gross per 24 hour  Intake 370 ml  Output 700 ml  Net -330 ml   Filed Weights   09/01/20 1816 09/02/20 0624  Weight: 129 kg 133.8 kg    Examination:  General exam: Appears calm and comfortable  Respiratory system: Crackles at bases. Respiratory effort normal. Cardiovascular system: S1 & S2 heard, RRR. No JVD, murmurs, rubs, gallops or clicks. No pedal edema. Gastrointestinal system: Abdomen is distended, soft and nontender. No organomegaly or masses felt. Normal bowel sounds heard. Central nervous system: Alert and oriented. No focal neurological deficits. Extremities: Bilateral lower extremities edematous with dressings up to knees.  Compression wraps have been removed. Skin: No rashes, lesions or ulcers Psychiatry: Judgement and insight appear normal. Mood & affect appropriate.     Data Reviewed: I have personally reviewed following labs and imaging studies  CBC: Recent Labs  Lab 09/01/20 2140 09/02/20 0012 09/02/20 0500  WBC 8.5 7.8 7.0  NEUTROABS 6.7  --   --   HGB 8.1* 7.4* 7.4*  HCT 27.6* 25.1* 26.0*  MCV 79.1* 79.4* 80.2  PLT 252 229 528   Basic Metabolic Panel: Recent Labs  Lab 09/01/20 1819 09/02/20 0012 09/02/20 0500  NA 132*  --  131*  K 3.8  --  3.7  CL 97*  --  98  CO2 20*  --  21*  GLUCOSE 245*  --  185*  BUN 123*  --  120*  CREATININE 6.16* 6.27* 6.15*  CALCIUM 7.8*  --  7.9*   GFR: Estimated Creatinine Clearance: 16.5 mL/min (A) (by C-G formula based on SCr of 6.15 mg/dL (H)). Liver Function Tests: Recent Labs  Lab 09/01/20 1819  AST 17  ALT 15  ALKPHOS 55  BILITOT 0.8  PROT 6.0*  ALBUMIN 2.5*   No results for input(s): LIPASE, AMYLASE in the last 168 hours. No results for input(s): AMMONIA in the last 168 hours. Coagulation Profile: No results for input(s): INR, PROTIME in the last 168 hours. Cardiac Enzymes: No results for input(s): CKTOTAL, CKMB, CKMBINDEX, TROPONINI in the last 168  hours. BNP (last 3 results) No results for input(s): PROBNP in the last 8760 hours. HbA1C: No results for input(s): HGBA1C in the last 72 hours. CBG: Recent Labs  Lab 09/02/20 0617 09/02/20 1109  GLUCAP 182* 164*   Lipid Profile: No results for input(s): CHOL, HDL, LDLCALC, TRIG, CHOLHDL, LDLDIRECT in the last 72 hours. Thyroid Function Tests: No results for input(s): TSH, T4TOTAL, FREET4, T3FREE, THYROIDAB in the last 72 hours. Anemia Panel: No results for input(s): VITAMINB12, FOLATE, FERRITIN, TIBC, IRON, RETICCTPCT in the last 72 hours. Sepsis Labs: No results for input(s): PROCALCITON, LATICACIDVEN in the last 168 hours.  Recent Results (from the past 240 hour(s))  Resp Panel by RT-PCR (Flu A&B, Covid) Nasopharyngeal Swab     Status: None   Collection Time: 09/01/20  9:06 PM   Specimen: Nasopharyngeal Swab;  Nasopharyngeal(NP) swabs in vial transport medium  Result Value Ref Range Status   SARS Coronavirus 2 by RT PCR NEGATIVE NEGATIVE Final    Comment: (NOTE) SARS-CoV-2 target nucleic acids are NOT DETECTED.  The SARS-CoV-2 RNA is generally detectable in upper respiratory specimens during the acute phase of infection. The lowest concentration of SARS-CoV-2 viral copies this assay can detect is 138 copies/mL. A negative result does not preclude SARS-Cov-2 infection and should not be used as the sole basis for treatment or other patient management decisions. A negative result may occur with  improper specimen collection/handling, submission of specimen other than nasopharyngeal swab, presence of viral mutation(s) within the areas targeted by this assay, and inadequate number of viral copies(<138 copies/mL). A negative result must be combined with clinical observations, patient history, and epidemiological information. The expected result is Negative.  Fact Sheet for Patients:  EntrepreneurPulse.com.au  Fact Sheet for Healthcare Providers:   IncredibleEmployment.be  This test is no t yet approved or cleared by the [place] FDA and  has been authorized for detection and/or diagnosis of SARS-CoV-2 by FDA under an Emergency Use Authorization (EUA). This EUA will remain  in effect (meaning this test can be used) for the duration of the COVID-19 declaration under Section 564(b)(1) of the Act, 21 U.S.C.section 360bbb-3(b)(1), unless the authorization is terminated  or revoked sooner.       Influenza A by PCR NEGATIVE NEGATIVE Final   Influenza B by PCR NEGATIVE NEGATIVE Final    Comment: (NOTE) The Xpert Xpress SARS-CoV-2/FLU/RSV plus assay is intended as an aid in the diagnosis of influenza from Nasopharyngeal swab specimens and should not be used as a sole basis for treatment. Nasal washings and aspirates are unacceptable for Xpert Xpress SARS-CoV-2/FLU/RSV testing.  Fact Sheet for Patients: EntrepreneurPulse.com.au  Fact Sheet for Healthcare Providers: IncredibleEmployment.be  This test is not yet approved or cleared by the [place] FDA and has been authorized for detection and/or diagnosis of SARS-CoV-2 by FDA under an Emergency Use Authorization (EUA). This EUA will remain in effect (meaning this test can be used) for the duration of the COVID-19 declaration under Section 564(b)(1) of the Act, 21 U.S.C. section 360bbb-3(b)(1), unless the authorization is terminated or revoked.  Performed at [facility], [place] [address] [place], [place] [address]          Radiology Studies: DG Chest 2 View  Result Date: 09/01/2020 CLINICAL DATA:  Shortness of breath. Pt endorses feeling ill and worsened the past 2 days. States he has a low hgb with blood drawn last week. Also having chest tightness and leg swelling. Pt has bilateral legs wrapped. EXAM: CHEST - 2 VIEW COMPARISON:  Chest x-ray 07/24/2020 FINDINGS: The heart size and mediastinal contours are  unchanged. Aortic calcification. Cardiac surgical changes overlie the mediastinum. Hilar vasculature prominence. Bilateral lower lobe hazy airspace opacities. Mild pulmonary edema. Persistent bilateral at least trace volume pleural effusions, left greater than right. No pneumothorax. No acute osseous abnormality. IMPRESSION: 1. Mild pulmonary edema with persistent bilateral at least trace volume pleural effusions, left greater than right. 2. ASsociatedbilateral lower lobe hazy airspace opacities that could represent a combination of atelectasis versus infection/inflammation. Electronically Signed   By: Iven Finn M.D.   On: 09/01/2020 18:47        Scheduled Meds:  carvedilol  6.25 mg Oral BID WC   Chlorhexidine Gluconate Cloth  6 each Topical Q0600   darbepoetin (ARANESP) injection - DIALYSIS  200 mcg Intravenous Q Sat-HD  heparin  5,000 Units Subcutaneous Q8H   Continuous Infusions:  ferric gluconate (FERRLECIT) IVPB 250 mg (09/02/20 1214)   furosemide 120 mg (09/02/20 1411)     LOS: 1 day    Time spent: 52mins    Kathie Dike, MD [facility]   If 7PM-7AM, please contact night-coverage [email or web address]  09/02/2020, 3:31 PM

## 2020-09-02 NOTE — Progress Notes (Signed)
Mobility Specialist: Progress Note   09/02/20 1437  Mobility  Activity Ambulated in hall  Level of Assistance Standby assist, set-up cues, supervision of patient - no hands on  Assistive Device Front wheel walker  Distance Ambulated (ft) 100 ft  Mobility Ambulated with assistance in hallway  Mobility Response Tolerated well  Mobility performed by Mobility specialist  $Mobility charge 1 Mobility   Pre-Mobility: 75 HR, 100% SpO2  Pt required minA to sit EOB from supine and was independent to stand with elevated bed. Pt asx throughout ambulation. Pt back to bed and then requesting to use BR for BM. Instructed pt to pull call string when he is done to be assisted to the chair.   [facility] [place] Sem Mobility Specialist Mobility Specialist Phone: [phone]

## 2020-09-03 ENCOUNTER — Encounter (HOSPITAL_COMMUNITY): Payer: Self-pay | Admitting: Internal Medicine

## 2020-09-03 LAB — GLUCOSE, CAPILLARY
Glucose-Capillary: 130 mg/dL — ABNORMAL HIGH (ref 70–99)
Glucose-Capillary: 162 mg/dL — ABNORMAL HIGH (ref 70–99)
Glucose-Capillary: 163 mg/dL — ABNORMAL HIGH (ref 70–99)
Glucose-Capillary: 156 mg/dL — ABNORMAL HIGH (ref 70–99)

## 2020-09-03 LAB — CBC
HCT: 22.9 % — ABNORMAL LOW (ref 39.0–52.0)
Hemoglobin: 6.7 g/dL — CL (ref 13.0–17.0)
MCH: 22.9 pg — ABNORMAL LOW (ref 26.0–34.0)
MCHC: 29.3 g/dL — ABNORMAL LOW (ref 30.0–36.0)
MCV: 78.4 fL — ABNORMAL LOW (ref 80.0–100.0)
Platelets: 209 10*3/uL (ref 150–400)
RBC: 2.92 MIL/uL — ABNORMAL LOW (ref 4.22–5.81)
RDW: 17.1 % — ABNORMAL HIGH (ref 11.5–15.5)
WBC: 7.4 10*3/uL (ref 4.0–10.5)
nRBC: 0 % (ref 0.0–0.2)

## 2020-09-03 LAB — RENAL FUNCTION PANEL
Albumin: 2.3 g/dL — ABNORMAL LOW (ref 3.5–5.0)
Anion gap: 14 (ref 5–15)
BUN: 122 mg/dL — ABNORMAL HIGH (ref 8–23)
CO2: 18 mmol/L — ABNORMAL LOW (ref 22–32)
Calcium: 7.8 mg/dL — ABNORMAL LOW (ref 8.9–10.3)
Chloride: 99 mmol/L (ref 98–111)
Creatinine, Ser: 6.1 mg/dL — ABNORMAL HIGH (ref 0.61–1.24)
GFR, Estimated: 9 mL/min — ABNORMAL LOW (ref >60)
Glucose, Bld: 170 mg/dL — ABNORMAL HIGH (ref 70–99)
Phosphorus: 8.3 mg/dL — ABNORMAL HIGH (ref 2.5–4.6)
Potassium: 3.6 mmol/L (ref 3.5–5.1)
Sodium: 131 mmol/L — ABNORMAL LOW (ref 135–145)

## 2020-09-03 LAB — HEMOGLOBIN AND HEMATOCRIT, BLOOD
HCT: 26.3 % — ABNORMAL LOW (ref 39.0–52.0)
Hemoglobin: 7.9 g/dL — ABNORMAL LOW (ref 13.0–17.0)

## 2020-09-03 LAB — PREPARE RBC (CROSSMATCH)

## 2020-09-03 MED ORDER — CEFAZOLIN SODIUM-DEXTROSE 2-4 GM/100ML-% IV SOLN
2.0000 g | INTRAVENOUS | Status: AC
  Filled 2020-09-03: qty 100

## 2020-09-03 MED ORDER — DARBEPOETIN ALFA 200 MCG/0.4ML IJ SOSY: 200.0000 ug | PREFILLED_SYRINGE | INTRAMUSCULAR | Status: DC

## 2020-09-03 MED ORDER — CALCIUM ACETATE (PHOS BINDER) 667 MG PO CAPS
1334.0000 mg | ORAL_CAPSULE | Freq: Three times a day (TID) | ORAL | Status: DC
  Administered 2020-09-03 – 2020-09-08 (×11): 1334 mg via ORAL
  Filled 2020-09-03 (×14): qty 2

## 2020-09-03 MED ORDER — FUROSEMIDE 10 MG/ML IJ SOLN
160.0000 mg | Freq: Four times a day (QID) | INTRAVENOUS | Status: DC
  Administered 2020-09-03 – 2020-09-08 (×20): 160 mg via INTRAVENOUS
  Filled 2020-09-03 (×5): qty 16
  Filled 2020-09-03: qty 10
  Filled 2020-09-03 (×2): qty 16
  Filled 2020-09-03 (×2): qty 10
  Filled 2020-09-03 (×4): qty 16
  Filled 2020-09-03: qty 10
  Filled 2020-09-03: qty 14
  Filled 2020-09-03 (×2): qty 10
  Filled 2020-09-03 (×2): qty 16
  Filled 2020-09-03 (×2): qty 10
  Filled 2020-09-03: qty 12
  Filled 2020-09-03: qty 16

## 2020-09-03 MED ORDER — SODIUM CHLORIDE 0.9% IV SOLUTION: Freq: Once | INTRAVENOUS | Status: AC

## 2020-09-03 MED ORDER — DARBEPOETIN ALFA 200 MCG/0.4ML IJ SOSY
200.0000 ug | PREFILLED_SYRINGE | Freq: Once | INTRAMUSCULAR | Status: AC
  Administered 2020-09-03: 200 ug via SUBCUTANEOUS
  Filled 2020-09-03: qty 0.4

## 2020-09-03 NOTE — Progress Notes (Signed)
Subjective:  Unable to use AVF for treatment yesterday due to high pressures-  only 400 of UOP recorded- much bruising on arm  Objective Vital signs in last 24 hours: Vitals:   09/03/20 0700 09/03/20 0714 09/03/20 0729 09/03/20 0730  BP: 131/64 131/64 116/74 116/74  Pulse: 73  74 74  Resp: 20  16 16   Temp: 98.1 F (36.7 C)  97.9 F (36.6 C) 97.9 F (36.6 C)  TempSrc: Oral   Oral  SpO2: 97%   96%  Weight:      Height:       Weight change: 3.8 kg  Intake/Output Summary (Last 24 hours) at 09/03/2020 0818 Last data filed at 09/03/2020 6301 Gross per 24 hour  Intake 685 ml  Output 400 ml  Net 285 ml    Assessment/Plan: 70 year old WM with cardiac issues as well as advanced CKD-  presents with volume overload, anemia and uremia 1.Renal- advanced CKD at baseline-  have been prepping for dialysis as OP.  Now with volume overload refractory to meds, and uremia.  Agrees to start dialysis.  Unfortunately  first treatment was not successful yesterday with AVF.  Will need to consult IR for fgram as well as [facility] placement so can proceed with starting HD this admit.   Lives in [place] so can CLIP to [place] OP unit-  also may be able to eventually do home HD-  will notify CLIP tomorrow as well  2. Hypertension/volume  - massive volume overload.  On iv lasix for now, making some urine so will continue and increase to max dose -  will do UF with HD also for volume once can establish 3. Anemia  - significant and not helping his DOE and weakness-  will load with iron and give ESA-  needing transfusion today  4. Bones-  check PTH- pending  phos up-  will start [place]    Labs: Basic Metabolic Panel: Recent Labs  Lab 09/01/20 1819 09/02/20 0012 09/02/20 0500 09/03/20 0116  NA 132*  --  131* 131*  K 3.8  --  3.7 3.6  CL 97*  --  98 99  CO2 20*  --  21* 18*  GLUCOSE 245*  --  185* 170*  BUN 123*  --  120* 122*  CREATININE 6.16* 6.27* 6.15* 6.10*  CALCIUM 7.8*   --  7.9* 7.8*  PHOS  --   --   --  8.3*   Liver Function Tests: Recent Labs  Lab 09/01/20 1819 09/03/20 0116  AST 17  --   ALT 15  --   ALKPHOS 55  --   BILITOT 0.8  --   PROT 6.0*  --   ALBUMIN 2.5* 2.3*   No results for input(s): LIPASE, AMYLASE in the last 168 hours. No results for input(s): AMMONIA in the last 168 hours. CBC: Recent Labs  Lab 09/01/20 2140 09/02/20 0012 09/02/20 0500 09/03/20 0116  WBC 8.5 7.8 7.0 7.4  NEUTROABS 6.7  --   --   --   HGB 8.1* 7.4* 7.4* 6.7*  HCT 27.6* 25.1* 26.0* 22.9*  MCV 79.1* 79.4* 80.2 78.4*  PLT 252 229 254 209   Cardiac Enzymes: No results for input(s): CKTOTAL, CKMB, CKMBINDEX, TROPONINI in the last 168 hours. CBG: Recent Labs  Lab 09/02/20 0617 09/02/20 1109 09/02/20 1750 09/02/20 2130 09/03/20 0608  GLUCAP 182* 164* 184* 208* 130*    Iron Studies: No results for input(s): IRON, TIBC,  TRANSFERRIN, FERRITIN in the last 72 hours. Studies/Results: DG Chest 2 View  Result Date: 09/01/2020 CLINICAL DATA:  Shortness of breath. Pt endorses feeling ill and worsened the past 2 days. States he has a low hgb with blood drawn last week. Also having chest tightness and leg swelling. Pt has bilateral legs wrapped. EXAM: CHEST - 2 VIEW COMPARISON:  Chest x-ray 07/24/2020 FINDINGS: The heart size and mediastinal contours are unchanged. Aortic calcification. Cardiac surgical changes overlie the mediastinum. Hilar vasculature prominence. Bilateral lower lobe hazy airspace opacities. Mild pulmonary edema. Persistent bilateral at least trace volume pleural effusions, left greater than right. No pneumothorax. No acute osseous abnormality. IMPRESSION: 1. Mild pulmonary edema with persistent bilateral at least trace volume pleural effusions, left greater than right. 2. ASsociatedbilateral lower lobe hazy airspace opacities that could represent a combination of atelectasis versus infection/inflammation. Electronically Signed   By: Iven Finn  M.D.   On: 09/01/2020 18:47   VAS [place] LOWER EXTREMITY VENOUS (DVT)  Result Date: 09/02/2020  Lower Venous DVT Study Patient Name:  MAHMOOD BOEHRINGER [facility]  Date of Exam:   09/02/2020 Medical Rec #: [id number]            Accession #:    [id number] Date of Birth: [birth year]            Patient Gender: M Patient Age:   70Y Exam Location:  [facility] Procedure:      VAS [place] LOWER EXTREMITY VENOUS (DVT) Referring Phys: [id number] [facility] MEMON --------------------------------------------------------------------------------  Indications: Weeping edema., and SOB.  Risk Factors: PE 12/16/19 CKD V, CAD, CHF, pulmonary vascular hypertension, anasarca. Limitations: Significant pitting edema throughout, open, bleeding sores, and pain in calves. Comparison Study: Negative bilateral lower extremity duplex done 12/22/19 Performing Technologist: Sharion Dove RVS  Examination Guidelines: A complete evaluation includes B-mode imaging, spectral Doppler, color Doppler, and power Doppler as needed of all accessible portions of each vessel. Bilateral testing is considered an integral part of a complete examination. Limited examinations for reoccurring indications may be performed as noted. The reflux portion of the exam is performed with the patient in reverse Trendelenburg.  +---------+---------------+---------+-----------+----------+--------------+ RIGHT    CompressibilityPhasicitySpontaneityPropertiesThrombus Aging +---------+---------------+---------+-----------+----------+--------------+ CFV      Full           Yes      Yes                                 +---------+---------------+---------+-----------+----------+--------------+ SFJ      Full                                                        +---------+---------------+---------+-----------+----------+--------------+ FV Prox  Full                                                         +---------+---------------+---------+-----------+----------+--------------+ FV Mid   Full                                                        +---------+---------------+---------+-----------+----------+--------------+  FV DistalFull                                                        +---------+---------------+---------+-----------+----------+--------------+ PFV      Full                                                        +---------+---------------+---------+-----------+----------+--------------+ POP      Full           Yes      Yes                                 +---------+---------------+---------+-----------+----------+--------------+ PTV      Full                                                        +---------+---------------+---------+-----------+----------+--------------+ PERO     Full                                                        +---------+---------------+---------+-----------+----------+--------------+   +---------+---------------+---------+-----------+----------+-------------------+ LEFT     CompressibilityPhasicitySpontaneityPropertiesThrombus Aging      +---------+---------------+---------+-----------+----------+-------------------+ CFV      Full           Yes      Yes                                      +---------+---------------+---------+-----------+----------+-------------------+ SFJ      Full                                                             +---------+---------------+---------+-----------+----------+-------------------+ FV Prox  Full                                                             +---------+---------------+---------+-----------+----------+-------------------+ FV Mid                  Yes      Yes                  patent by color and  Doppler              +---------+---------------+---------+-----------+----------+-------------------+ FV Distal               Yes      Yes                  patent by color and                                                       Doppler             +---------+---------------+---------+-----------+----------+-------------------+ POP      Full           Yes      Yes                                      +---------+---------------+---------+-----------+----------+-------------------+ PTV                                                   Not well visualized +---------+---------------+---------+-----------+----------+-------------------+ PERO                                                  Not well visualized +---------+---------------+---------+-----------+----------+-------------------+    Summary: RIGHT: - There is no evidence of deep vein thrombosis in the lower extremity. However, portions of this examination were limited- see technologist comments above.  LEFT: - There is no evidence of deep vein thrombosis in the lower extremity. However, portions of this examination were limited- see technologist comments above.  *See table(s) above for measurements and observations.    Preliminary    Medications: Infusions:  ferric gluconate (FERRLECIT) IVPB 250 mg (09/02/20 1214)   furosemide 120 mg (09/03/20 0543)    Scheduled Medications:  carvedilol  6.25 mg Oral BID WC   Chlorhexidine Gluconate Cloth  6 each Topical Q0600   darbepoetin (ARANESP) injection - DIALYSIS  200 mcg Intravenous Q Sat-HD   heparin  5,000 Units Subcutaneous Q8H    have reviewed scheduled and prn medications.  Physical Exam: General: sitting up in bedside chair-  pale, fatigued Heart: RRR Lungs: mostly clear Abdomen: obese, soft, non tender Extremities: pitting edema and LE weeping  Dialysis Access: left AVF -  much bruising-  good thrill and bruit     09/03/2020,8:18 AM  LOS: 2 days

## 2020-09-03 NOTE — Progress Notes (Signed)
PROGRESS NOTE    Travis Blair  ZYY:[id number] DOB: April 10, 1950 DOA: 09/01/2020 PCP: [name], MD    Brief Narrative:  70 year old male with a history of chronic kidney disease stage V, diastolic heart failure, admitted to the hospital with worsening lower extremity edema and shortness of breath.  He was noted to have worsening renal failure.  He was seen by nephrology with recommendations to initiate dialysis to help manage his volume status.   Assessment & Plan:   Principal Problem:   Acute on chronic congestive heart failure (HCC) Active Problems:   S/P CABG x 3   Essential hypertension   CKD (chronic kidney disease) stage 5, GFR less than 15 ml/min (HCC)   Acute on chronic diastolic CHF (congestive heart failure) (HCC)   Acute on chronic kidney disease stage V -Patient presents with volume overload, elevated creatinine -He already has an AV fistula placed in preparation for dialysis -Seen by nephrology with recommendations to initiate dialysis -CLIP process to be initiated by [facility] -Continued on diuretics since he is still making urine -First HD session attempted on 7/24, but unfortunately was not successful with aVF -Interventional radiology will evaluate patient for fistulogram and possible TDC placement on 7/25  Acute on chronic diastolic congestive heart failure with pulmonary hypertension -Volume removal to be accomplished through dialysis  Anemia -Suspect a large component from underlying renal disease -Started on iron infusion as well as ESA -Recently, he was noted to have heme positive stools by his PCP, he did not have any evidence of gross GI bleeding -He was to undergo outpatient GI work-up, but was unable to safely perform the studies due to his volume overload and inability to lay flat on the table -Further GI evaluation has been rescheduled for next month -We will recheck FOBT -Hemoglobin down to 6.7 this morning -Transfuse 1 unit of PRBC,  follow-up hemoglobin 7.9 -Continue to monitor hemoglobin and transfuse for hemoglobin less than 7  Recent history of right lower lobe PE and left lower extremity DVT diagnosed 12/2019 -Patient was taking Eliquis up until 06/2020 -This was discontinued when he was noted to have heme positive stools -Venous Dopplers of lower extremity rechecked and did not show any residual DVT  Atrial fibrillation -Heart rate is currently stable, on Coreg -Currently not on anticoagulation due to heme positive stools  Obstructive sleep apnea -Continue on CPAP  Coronary artery disease status post CABG -No chest pain at this time  Type 2 diabetes -Patient was having hypoglycemic episodes prior to admission -Currently is not on any maintenance medications -Blood sugars have been stable -Continue to follow CBG    DVT prophylaxis: heparin injection 5,000 Units Start: 09/01/20 2330  Code Status: Full code Family Communication: Discussed with his wife at the bedside Disposition Plan: Status is: Inpatient  Remains inpatient appropriate because:Ongoing diagnostic testing needed not appropriate for outpatient work up, IV treatments appropriate due to intensity of illness or inability to take PO, and Inpatient level of care appropriate due to severity of illness  Dispo: The patient is from: Home              Anticipated d/c is to: Home              Patient currently is not medically stable to d/c.   Difficult to place patient No         Consultants:  Nephrology Interventional radiology  Procedures:    Antimicrobials:      Subjective: Overall he says he  is feeling better today.  Feels her breathing is a little better today.  Did not tolerate dialysis last night.  Noted to have significant bruising around fistula site.  He had 2 bowel movements yesterday.  He is unaware of any blood in the stool.  Objective: Vitals:   09/03/20 0730 09/03/20 0945 09/03/20 1100 09/03/20 1130  BP: 116/74  137/64  133/64  Pulse: 74 75  60  Resp: 16 19 20 20   Temp: 97.9 F (36.6 C) 97.6 F (36.4 C) 98.1 F (36.7 C) 98.1 F (36.7 C)  TempSrc: Oral Oral Oral Oral  SpO2: 96% 98%  98%  Weight:      Height:        Intake/Output Summary (Last 24 hours) at 09/03/2020 1656 Last data filed at 09/03/2020 0945 Gross per 24 hour  Intake 617 ml  Output 400 ml  Net 217 ml   Filed Weights   09/01/20 1816 09/02/20 0624 09/03/20 0500  Weight: 129 kg 133.8 kg 132.8 kg    Examination:  General exam: Alert, awake, oriented x 3 Respiratory system: Crackles at bases. Respiratory effort normal. Cardiovascular system:RRR. No murmurs, rubs, gallops. Gastrointestinal system: Abdomen is nondistended, soft and nontender. No organomegaly or masses felt. Normal bowel sounds heard. Central nervous system: Alert and oriented. No focal neurological deficits. Extremities: anasarca is present with 2+ edema, bruising noted in LUE around fistula site Skin: No rashes, lesions or ulcers Psychiatry: Judgement and insight appear normal. Mood & affect appropriate.      Data Reviewed: I have personally reviewed following labs and imaging studies  CBC: Recent Labs  Lab 09/01/20 2140 09/02/20 0012 09/02/20 0500 09/03/20 0116 09/03/20 1447  WBC 8.5 7.8 7.0 7.4  --   NEUTROABS 6.7  --   --   --   --   HGB 8.1* 7.4* 7.4* 6.7* 7.9*  HCT 27.6* 25.1* 26.0* 22.9* 26.3*  MCV 79.1* 79.4* 80.2 78.4*  --   PLT 252 229 254 209  --    Basic Metabolic Panel: Recent Labs  Lab 09/01/20 1819 09/02/20 0012 09/02/20 0500 09/03/20 0116  NA 132*  --  131* 131*  K 3.8  --  3.7 3.6  CL 97*  --  98 99  CO2 20*  --  21* 18*  GLUCOSE 245*  --  185* 170*  BUN 123*  --  120* 122*  CREATININE 6.16* 6.27* 6.15* 6.10*  CALCIUM 7.8*  --  7.9* 7.8*  PHOS  --   --   --  8.3*   GFR: Estimated Creatinine Clearance: 16.6 mL/min (A) (by C-G formula based on SCr of 6.1 mg/dL (H)). Liver Function Tests: Recent Labs  Lab  09/01/20 1819 09/03/20 0116  AST 17  --   ALT 15  --   ALKPHOS 55  --   BILITOT 0.8  --   PROT 6.0*  --   ALBUMIN 2.5* 2.3*   No results for input(s): LIPASE, AMYLASE in the last 168 hours. No results for input(s): AMMONIA in the last 168 hours. Coagulation Profile: No results for input(s): INR, PROTIME in the last 168 hours. Cardiac Enzymes: No results for input(s): CKTOTAL, CKMB, CKMBINDEX, TROPONINI in the last 168 hours. BNP (last 3 results) No results for input(s): PROBNP in the last 8760 hours. HbA1C: No results for input(s): HGBA1C in the last 72 hours. CBG: Recent Labs  Lab 09/02/20 1109 09/02/20 1750 09/02/20 2130 09/03/20 0608 09/03/20 1125  GLUCAP 164* 184* 208* 130* 162*  Lipid Profile: No results for input(s): CHOL, HDL, LDLCALC, TRIG, CHOLHDL, LDLDIRECT in the last 72 hours. Thyroid Function Tests: No results for input(s): TSH, T4TOTAL, FREET4, T3FREE, THYROIDAB in the last 72 hours. Anemia Panel: No results for input(s): VITAMINB12, FOLATE, FERRITIN, TIBC, IRON, RETICCTPCT in the last 72 hours. Sepsis Labs: No results for input(s): PROCALCITON, LATICACIDVEN in the last 168 hours.  Recent Results (from the past 240 hour(s))  Resp Panel by RT-PCR (Flu A&B, Covid) Nasopharyngeal Swab     Status: None   Collection Time: 09/01/20  9:06 PM   Specimen: Nasopharyngeal Swab; Nasopharyngeal(NP) swabs in vial transport medium  Result Value Ref Range Status   SARS Coronavirus 2 by RT PCR NEGATIVE NEGATIVE Final    Comment: (NOTE) SARS-CoV-2 target nucleic acids are NOT DETECTED.  The SARS-CoV-2 RNA is generally detectable in upper respiratory specimens during the acute phase of infection. The lowest concentration of SARS-CoV-2 viral copies this assay can detect is 138 copies/mL. A negative result does not preclude SARS-Cov-2 infection and should not be used as the sole basis for treatment or other patient management decisions. A negative result may occur with   improper specimen collection/handling, submission of specimen other than nasopharyngeal swab, presence of viral mutation(s) within the areas targeted by this assay, and inadequate number of viral copies(<138 copies/mL). A negative result must be combined with clinical observations, patient history, and epidemiological information. The expected result is Negative.  Fact Sheet for Patients:  EntrepreneurPulse.com.au  Fact Sheet for Healthcare Providers:  IncredibleEmployment.be  This test is no t yet approved or cleared by the [place] FDA and  has been authorized for detection and/or diagnosis of SARS-CoV-2 by FDA under an Emergency Use Authorization (EUA). This EUA will remain  in effect (meaning this test can be used) for the duration of the COVID-19 declaration under Section 564(b)(1) of the Act, 21 U.S.C.section 360bbb-3(b)(1), unless the authorization is terminated  or revoked sooner.       Influenza A by PCR NEGATIVE NEGATIVE Final   Influenza B by PCR NEGATIVE NEGATIVE Final    Comment: (NOTE) The Xpert Xpress SARS-CoV-2/FLU/RSV plus assay is intended as an aid in the diagnosis of influenza from Nasopharyngeal swab specimens and should not be used as a sole basis for treatment. Nasal washings and aspirates are unacceptable for Xpert Xpress SARS-CoV-2/FLU/RSV testing.  Fact Sheet for Patients: EntrepreneurPulse.com.au  Fact Sheet for Healthcare Providers: IncredibleEmployment.be  This test is not yet approved or cleared by the [place] FDA and has been authorized for detection and/or diagnosis of SARS-CoV-2 by FDA under an Emergency Use Authorization (EUA). This EUA will remain in effect (meaning this test can be used) for the duration of the COVID-19 declaration under Section 564(b)(1) of the Act, 21 U.S.C. section 360bbb-3(b)(1), unless the authorization is terminated  or revoked.  Performed at [facility], [place] [address] [place], [place] [address]          Radiology Studies: DG Chest 2 View  Result Date: 09/01/2020 CLINICAL DATA:  Shortness of breath. Pt endorses feeling ill and worsened the past 2 days. States he has a low hgb with blood drawn last week. Also having chest tightness and leg swelling. Pt has bilateral legs wrapped. EXAM: CHEST - 2 VIEW COMPARISON:  Chest x-ray 07/24/2020 FINDINGS: The heart size and mediastinal contours are unchanged. Aortic calcification. Cardiac surgical changes overlie the mediastinum. Hilar vasculature prominence. Bilateral lower lobe hazy airspace opacities. Mild pulmonary edema. Persistent bilateral at least trace  volume pleural effusions, left greater than right. No pneumothorax. No acute osseous abnormality. IMPRESSION: 1. Mild pulmonary edema with persistent bilateral at least trace volume pleural effusions, left greater than right. 2. ASsociatedbilateral lower lobe hazy airspace opacities that could represent a combination of atelectasis versus infection/inflammation. Electronically Signed   By: Iven Finn M.D.   On: 09/01/2020 18:47   VAS [place] LOWER EXTREMITY VENOUS (DVT)  Result Date: 09/03/2020  Lower Venous DVT Study Patient Name:  JEFFEY JANSSEN [facility]  Date of Exam:   09/02/2020 Medical Rec #: [id number]            Accession #:    [id number] Date of Birth: 04-16-1950            Patient Gender: M Patient Age:   70Y Exam Location:  [facility] Procedure:      VAS [place] LOWER EXTREMITY VENOUS (DVT) Referring Phys: [id number] [facility] Deondre Marinaro --------------------------------------------------------------------------------  Indications: Weeping edema., and SOB.  Risk Factors: PE 12/16/19 CKD V, CAD, CHF, pulmonary vascular hypertension, anasarca. Limitations: Significant pitting edema throughout, open, bleeding sores, and pain in calves. Comparison Study: Negative bilateral lower extremity duplex done 12/22/19  Performing Technologist: Sharion Dove RVS  Examination Guidelines: A complete evaluation includes B-mode imaging, spectral Doppler, color Doppler, and power Doppler as needed of all accessible portions of each vessel. Bilateral testing is considered an integral part of a complete examination. Limited examinations for reoccurring indications may be performed as noted. The reflux portion of the exam is performed with the patient in reverse Trendelenburg.  +---------+---------------+---------+-----------+----------+--------------+ RIGHT    CompressibilityPhasicitySpontaneityPropertiesThrombus Aging +---------+---------------+---------+-----------+----------+--------------+ CFV      Full           Yes      Yes                                 +---------+---------------+---------+-----------+----------+--------------+ SFJ      Full                                                        +---------+---------------+---------+-----------+----------+--------------+ FV Prox  Full                                                        +---------+---------------+---------+-----------+----------+--------------+ FV Mid   Full                                                        +---------+---------------+---------+-----------+----------+--------------+ FV DistalFull                                                        +---------+---------------+---------+-----------+----------+--------------+ PFV      Full                                                        +---------+---------------+---------+-----------+----------+--------------+  POP      Full           Yes      Yes                                 +---------+---------------+---------+-----------+----------+--------------+ PTV      Full                                                        +---------+---------------+---------+-----------+----------+--------------+ PERO     Full                                                         +---------+---------------+---------+-----------+----------+--------------+   +---------+---------------+---------+-----------+----------+-------------------+ LEFT     CompressibilityPhasicitySpontaneityPropertiesThrombus Aging      +---------+---------------+---------+-----------+----------+-------------------+ CFV      Full           Yes      Yes                                      +---------+---------------+---------+-----------+----------+-------------------+ SFJ      Full                                                             +---------+---------------+---------+-----------+----------+-------------------+ FV Prox  Full                                                             +---------+---------------+---------+-----------+----------+-------------------+ FV Mid                  Yes      Yes                  patent by color and                                                       Doppler             +---------+---------------+---------+-----------+----------+-------------------+ FV Distal               Yes      Yes                  patent by color and  Doppler             +---------+---------------+---------+-----------+----------+-------------------+ POP      Full           Yes      Yes                                      +---------+---------------+---------+-----------+----------+-------------------+ PTV                                                   Not well visualized +---------+---------------+---------+-----------+----------+-------------------+ PERO                                                  Not well visualized +---------+---------------+---------+-----------+----------+-------------------+     Summary: RIGHT: - There is no evidence of deep vein thrombosis in the lower extremity. However, portions of this examination were  limited- see technologist comments above.  LEFT: - There is no evidence of deep vein thrombosis in the lower extremity. However, portions of this examination were limited- see technologist comments above.  *See table(s) above for measurements and observations. Electronically signed by Jamelle Haring on 09/03/2020 at 9:20:49 AM.    Final         Scheduled Meds:  calcium acetate  1,334 mg Oral TID WC   carvedilol  6.25 mg Oral BID WC   Chlorhexidine Gluconate Cloth  6 each Topical Q0600   darbepoetin (ARANESP) injection - NON-DIALYSIS  200 mcg Subcutaneous Once   Followed by   Derrill Memo ON 09/11/2020] darbepoetin (ARANESP) injection - DIALYSIS  200 mcg Intravenous Q Mon-HD   heparin  5,000 Units Subcutaneous Q8H   Continuous Infusions:  [START ON 09/04/2020]  ceFAZolin (ANCEF) IV     ferric gluconate (FERRLECIT) IVPB 250 mg (09/03/20 1007)   furosemide 160 mg (09/03/20 1115)     LOS: 2 days    Time spent: 65mins    Kathie Dike, MD [facility]   If 7PM-7AM, please contact night-coverage [email or web address]  09/03/2020, 4:56 PM

## 2020-09-03 NOTE — Progress Notes (Signed)
Mobility Specialist: Progress Note   09/03/20 1353  Mobility  Activity Ambulated in hall  Level of Assistance Minimal assist, patient does 75% or more  Assistive Device Front wheel walker  Distance Ambulated (ft) 90 ft  Mobility Ambulated with assistance in hallway  Mobility Response Tolerated well  Mobility performed by Mobility specialist  Bed Position Chair  $Mobility charge 1 Mobility   Pre-Mobility: 66 HR, 96% SpO2 During Mobility: 80 HR, 99% SpO2 Post-Mobility: 64 HR, 102/62 BP, 96% SpO2  Pt c/o feeling a little dizzy during ambulation and weak limiting his distance. Pt experienced LOB to his R side when turning around and going back to the room, required minA to recover balance. Pt back to the recliner after walk with call bell and phone in reach with family members present in the room.   [facility] Lashanta Elbe Mobility Specialist Mobility Specialist Phone: [phone]

## 2020-09-03 NOTE — Progress Notes (Signed)
Overnight progress note  Hemoglobin down to 6.7 on labs done this morning.  I spoke to the patient and he denies any dizziness, chest pain, or shortness of breath.  Denies symptoms of GI bleed.  He has given verbal consent for blood transfusion.  -Type and screen, 1 unit PRBCs ordered.  Follow-up posttransfusion H&H.

## 2020-09-03 NOTE — H&P (Signed)
Chief Complaint: Patient was seen in consultation today for image guided left AVF fistulogram and possible tunneled dialysis catheter placement Chief Complaint  Patient presents with   Shortness of Breath   Weakness   at the request of Dr. Kathrene Bongo, K.  Referring Physician(s): Dr. Kathrene Bongo, K.  Supervising Physician: Irish Lack  Patient Status: [facility] - In-pt  History of Present Illness: Travis Blair is a 70 y.o. male with past medical history of CHF, MI, CAD s/p CABG, pneumonia, PE and DVT on Eliquis, HTN, HLD, sleep apnea, CKD stage IV s/p AVF creation on left UE on 05/04/2020 currently hospitalized due to shortness of breath, peripheral edema, and worsening of kidney function.   Nephrology was consulted for management of worsening of kidney function, who recommended initiation of hemodialysis to the patient, and patient underwent for first hemodialysis via left AVF which was unsuccessful.  Attempt to use the left AVF unfortunately caused significant amount of bruising on patient's left upper arm.  IR was requested for image guided fistulogram of the left aVF and possible tunneled dialysis catheter placement.  Patient sitting in a recliner, not in acute distress.  Patient is receiving blood. Patient states that his doctors are concerned about GI bleeding, stool has been black but he is also on iron.  He denies seeing bright red blood from his rectum.  Patient states that there was a discussion about GI referral for further evaluation.  Denise headache, lightheadedness, chest palpitation, fever, chills, shortness of breath, cough, chest pain, abdominal pain, nausea ,vomiting.    Past Medical History:  Diagnosis Date   Anemia associated with chronic renal failure basically   Asthma    as a child   CHF (congestive heart failure) (HCC)    Chronic kidney disease    Sees Dr Hyman Hopes   Constipation    Coronary artery disease    Diabetes mellitus without complication  (HCC)    DVT (deep venous thrombosis) (HCC)    History of blood clots    Hyperlipidemia    Hypertension    Leg pain    ABIs/LE Arterial US 03/2019: ABIs unreliable; +calcification, no evidence of stenosis   Myocardial infarction (HCC)    Obesity    Pneumonia 2018   Pulmonary embolism (HCC)    Sleep apnea    CPAP   SOB (shortness of breath)    Swelling of both lower extremities     Past Surgical History:  Procedure Laterality Date   AV FISTULA PLACEMENT Left 05/04/2020   Procedure: LEFT ARM ARTERIOVENOUS (AV) FISTULA CREATION;  Surgeon: Larina Earthly, MD;  Location: [facility];  Service: Vascular;  Laterality: Left;   COLONOSCOPY     CORONARY ARTERY BYPASS GRAFT N/A 03/07/2017   Procedure: CORONARY ARTERY BYPASS GRAFTING (CABG), X 3 , USING RIGHT INTERNAL MAMMARY ARTERY, AND RIGHT LEG GREATER SAPHENOUS VEIN HARVESTED ENDOSCOPICALLY;  Surgeon: Kerin Perna, MD;  Location: [facility] OR;  Service: Open Heart Surgery;  Laterality: N/A;   HAND SURGERY Right 1991   RIGHT HEART CATH N/A 07/07/2020   Procedure: RIGHT HEART CATH;  Surgeon: Dolores Patty, MD;  Location: [facility] INVASIVE CV LAB;  Service: Cardiovascular;  Laterality: N/A;   RIGHT/LEFT HEART CATH AND CORONARY ANGIOGRAPHY N/A 12/20/2016   Procedure: RIGHT/LEFT HEART CATH AND CORONARY ANGIOGRAPHY;  Surgeon: Elder Negus, MD;  Location: [facility] INVASIVE CV LAB;  Service: Cardiovascular;  Laterality: N/A;   TEE WITHOUT CARDIOVERSION N/A 03/07/2017   Procedure: TRANSESOPHAGEAL ECHOCARDIOGRAM (TEE);  Surgeon: Zenaida Niece  Donney Rankins, MD;  Location: [place];  Service: Open Heart Surgery;  Laterality: N/A;   TONSILLECTOMY      Allergies: Lipitor [atorvastatin]  Medications: Prior to Admission medications   Medication Sig Start Date End Date Taking? Authorizing Provider  albuterol (VENTOLIN HFA) 108 (90 Base) MCG/ACT inhaler Inhale 1-2 puffs into the lungs every 6 (six) hours as needed for wheezing or shortness of breath. 07/04/20   [name], MD  carvedilol (COREG) 6.25 MG tablet TAKE 1 TABLET (6.25 MG TOTAL) BY MOUTH 2 (TWO) TIMES DAILY WITH A MEAL. 01/12/20   Richardson Dopp T, PA-C  doxycycline (VIBRA-TABS) 100 MG tablet Take 1 tablet (100 mg total) by mouth 2 (two) times daily. 08/18/20   Evelina Bucy, DPM  Evolocumab (REPATHA SURECLICK) 371 MG/ML SOAJ Inject 1 pen into the skin every 14 (fourteen) days. 06/23/20   Nahser, Wonda Cheng, MD  ferrous sulfate (FERROUSUL) 325 (65 FE) MG tablet Take 1 tablet (325 mg total) by mouth daily with breakfast. 01/28/20   [name], MD  fluticasone (FLONASE) 50 MCG/ACT nasal spray Place 2 sprays into both nostrils daily. 05/01/20   [name], MD  guaiFENesin-codeine 100-10 MG/5ML syrup Take 10 mLs by mouth every 6 (six) hours as needed for cough. 07/04/20   [name], MD  metolazone (ZAROXOLYN) 5 MG tablet Take 1 tablet (5 mg total) by mouth daily. 05/19/20   [name], MD  pantoprazole (PROTONIX) 40 MG tablet Take 1 tablet (40 mg total) by mouth daily. 08/18/20   [name], MD  torsemide (DEMADEX) 20 MG tablet Take 2 tablets (40 mg total) by mouth daily. Patient taking differently: Take 100 mg by mouth daily. 05/03/20   [name], MD     Family History  Problem Relation Age of Onset   Heart disease Mother    Diabetes Mother    Multiple myeloma Mother    Cancer Father        type unknown, as a child   Heart disease Father    Hypertension Father    Congenital heart disease Father    Hyperlipidemia Father    Lymphoma Sister     Social History   Socioeconomic History   Marital status: Married    Spouse name: Not on file   Number of children: 2   Years of education: Not on file   Highest education level: Not on file  Occupational History   Occupation: general contractor-retired  Tobacco Use   Smoking status: Never   Smokeless tobacco: Never  Vaping Use   Vaping Use: Never used  Substance and Sexual Activity   Alcohol use: No   Drug use:  No   Sexual activity: Never  Other Topics Concern   Not on file  Social History Narrative   Not on file   Social Determinants of Health   Financial Resource Strain: Not on file  Food Insecurity: Not on file  Transportation Needs: Not on file  Physical Activity: Not on file  Stress: Not on file  Social Connections: Not on file     Review of Systems: A 12 point ROS discussed and pertinent positives are indicated in the HPI above.  All other systems are negative.   Vital Signs: BP 137/64   Pulse 75   Temp 97.6 F (36.4 C) (Oral)   Resp 19   Ht $R'6\' 2"'Ju$  (1.88 m)   Wt 292 lb 12.3 oz (132.8 kg)   SpO2  98%   BMI 37.59 kg/m   Physical Exam Vitals reviewed.  Constitutional:      General: He is not in acute distress.    Appearance: He is not ill-appearing.  HENT:     Head: Normocephalic and atraumatic.  Cardiovascular:     Rate and Rhythm: Normal rate and regular rhythm.  Pulmonary:     Effort: Pulmonary effort is normal.     Breath sounds: Normal breath sounds.  Abdominal:     General: Bowel sounds are normal.     Palpations: Abdomen is soft.  Skin:    General: Skin is warm and dry.     Coloration: Skin is not pale.     Comments: Ecchymosis noted around the distal end of left humerus  Neurological:     Mental Status: He is alert and oriented to person, place, and time.  Psychiatric:        Mood and Affect: Mood normal.        Behavior: Behavior normal.    MD Evaluation Airway: WNL Heart: WNL Abdomen: WNL Chest/ Lungs: WNL ASA  Classification: 3 Mallampati/Airway Score: One  Imaging: DG Chest 2 View  Result Date: 09/01/2020 CLINICAL DATA:  Shortness of breath. Pt endorses feeling ill and worsened the past 2 days. States he has a low hgb with blood drawn last week. Also having chest tightness and leg swelling. Pt has bilateral legs wrapped. EXAM: CHEST - 2 VIEW COMPARISON:  Chest x-ray 07/24/2020 FINDINGS: The heart size and mediastinal contours are unchanged.  Aortic calcification. Cardiac surgical changes overlie the mediastinum. Hilar vasculature prominence. Bilateral lower lobe hazy airspace opacities. Mild pulmonary edema. Persistent bilateral at least trace volume pleural effusions, left greater than right. No pneumothorax. No acute osseous abnormality. IMPRESSION: 1. Mild pulmonary edema with persistent bilateral at least trace volume pleural effusions, left greater than right. 2. ASsociatedbilateral lower lobe hazy airspace opacities that could represent a combination of atelectasis versus infection/inflammation. Electronically Signed   By: Iven Finn M.D.   On: 09/01/2020 18:47   DG Foot Complete Right  Result Date: 08/23/2020 Please see detailed radiograph report in office note.  VAS [place] LOWER EXTREMITY VENOUS (DVT)  Result Date: 09/03/2020  Lower Venous DVT Study Patient Name:  Travis Blair [facility]  Date of Exam:   09/02/2020 Medical Rec #: [id number]            Accession #:    [id number] Date of Birth: [birth year]            Patient Gender: M Patient Age:   70Y Exam Location:  [facility] Procedure:      VAS [place] LOWER EXTREMITY VENOUS (DVT) Referring Phys: [id number] [facility] --------------------------------------------------------------------------------  Indications: Weeping edema., and SOB.  Risk Factors: PE 12/16/19 CKD V, CAD, CHF, pulmonary vascular hypertension, anasarca. Limitations: Significant pitting edema throughout, open, bleeding sores, and pain in calves. Comparison Study: Negative bilateral lower extremity duplex done 12/22/19 Performing Technologist: Sharion Dove RVS  Examination Guidelines: A complete evaluation includes B-mode imaging, spectral Doppler, color Doppler, and power Doppler as needed of all accessible portions of each vessel. Bilateral testing is considered an integral part of a complete examination. Limited examinations for reoccurring indications may be performed as noted. The reflux portion of the exam is  performed with the patient in reverse Trendelenburg.  +---------+---------------+---------+-----------+----------+--------------+ RIGHT    CompressibilityPhasicitySpontaneityPropertiesThrombus Aging +---------+---------------+---------+-----------+----------+--------------+ CFV      Full           Yes  Yes                                 +---------+---------------+---------+-----------+----------+--------------+ SFJ      Full                                                        +---------+---------------+---------+-----------+----------+--------------+ FV Prox  Full                                                        +---------+---------------+---------+-----------+----------+--------------+ FV Mid   Full                                                        +---------+---------------+---------+-----------+----------+--------------+ FV DistalFull                                                        +---------+---------------+---------+-----------+----------+--------------+ PFV      Full                                                        +---------+---------------+---------+-----------+----------+--------------+ POP      Full           Yes      Yes                                 +---------+---------------+---------+-----------+----------+--------------+ PTV      Full                                                        +---------+---------------+---------+-----------+----------+--------------+ PERO     Full                                                        +---------+---------------+---------+-----------+----------+--------------+   +---------+---------------+---------+-----------+----------+-------------------+ LEFT     CompressibilityPhasicitySpontaneityPropertiesThrombus Aging      +---------+---------------+---------+-----------+----------+-------------------+ CFV      Full           Yes      Yes                                       +---------+---------------+---------+-----------+----------+-------------------+  SFJ      Full                                                             +---------+---------------+---------+-----------+----------+-------------------+ FV Prox  Full                                                             +---------+---------------+---------+-----------+----------+-------------------+ FV Mid                  Yes      Yes                  patent by color and                                                       Doppler             +---------+---------------+---------+-----------+----------+-------------------+ FV Distal               Yes      Yes                  patent by color and                                                       Doppler             +---------+---------------+---------+-----------+----------+-------------------+ POP      Full           Yes      Yes                                      +---------+---------------+---------+-----------+----------+-------------------+ PTV                                                   Not well visualized +---------+---------------+---------+-----------+----------+-------------------+ PERO                                                  Not well visualized +---------+---------------+---------+-----------+----------+-------------------+     Summary: RIGHT: - There is no evidence of deep vein thrombosis in the lower extremity. However, portions of this examination were limited- see technologist comments above.  LEFT: - There is no evidence of deep vein thrombosis in the lower extremity. However, portions of this examination were limited- see technologist comments above.  *See table(s) above for measurements and observations. Electronically signed by Jamelle Haring  on 09/03/2020 at 9:20:49 AM.    Final     Labs:  CBC: Recent Labs    09/01/20 2140 09/02/20 0012  09/02/20 0500 09/03/20 0116  WBC 8.5 7.8 7.0 7.4  HGB 8.1* 7.4* 7.4* 6.7*  HCT 27.6* 25.1* 26.0* 22.9*  PLT 252 229 254 209    COAGS: No results for input(s): INR, APTT in the last 8760 hours.  BMP: Recent Labs    05/01/20 1146 05/03/20 1121 05/04/20 0821 05/11/20 0839 05/23/20 1426 05/23/20 1426 07/07/20 1010 07/07/20 1349 09/01/20 1819 09/02/20 0012 09/02/20 0500 09/03/20 0116  NA 138 137   < > 139 136  --  132* 135  136 132*  --  131* 131*  K 4.8 4.8   < > 4.7 4.6  --  4.1 4.0  4.0 3.8  --  3.7 3.6  CL 106 105   < > 103 97*  --  98  --  97*  --  98 99  CO2 24 27  --  25 26  --  25  --  20*  --  21* 18*  GLUCOSE 133* 169*   < > 140* 182*  --  155*  --  245*  --  185* 170*  BUN 49* 52*   < > 59* 83*  --  91*  --  123*  --  120* 122*  CALCIUM 7.8* 8.0*  --  8.0* 8.2*  --  8.3*  --  7.8*  --  7.9* 7.8*  CREATININE 3.22* 3.25*   < > 3.25* 3.49*  --  4.21*  --  6.16* 6.27* 6.15* 6.10*  GFRNONAA 19* 19*  --  19* 17*   < > 15*  --  9* 9* 9* 9*  GFRAA 22* 21*  --  21* 20*  --   --   --   --   --   --   --    < > = values in this interval not displayed.    LIVER FUNCTION TESTS: Recent Labs    01/27/20 0900 03/10/20 1248 03/29/20 1117 09/01/20 1819 09/03/20 0116  BILITOT 0.3 0.2  --  0.8  --   AST 14 13  --  17  --   ALT $Re'10 9 9 15  'ktv$ --   ALKPHOS  --   --   --  55  --   PROT 5.4* 5.4*  --  6.0*  --   ALBUMIN  --   --   --  2.5* 2.3*    TUMOR MARKERS: No results for input(s): AFPTM, CEA, CA199, CHROMGRNA in the last 8760 hours.  Assessment and Plan: 70 y.o. male with history of CKD stage IV, currently hospitalized due to shortness of breath possibly caused by either CHF exacerbation or worsening of kidney function. Patient has been followed by [facility], underwent left AV fistula creation on 05/04/2020.  Patient was evaluated by nephrology during current admission and underwent first hemodialysis session which was unsuccessful.  Attempt to access  the left AV fistula unfortunately caused significant amount of bruising on the left arm.  IR was requested for image guided fistulogram and possible tunneled hemodialysis catheter placement. VSS CBC no leukocytosis, anemia of chronic disease with baseline hemoglobin in upper 7 and lower 8, Hgb dropped to 6.7 today patient is receiving PRBC.  PLT 209 -patient was asymptomatic, will recheck Hgb on the day of the procedure  BMP BUN 122, creatinine 6.10, GFR 9  Patient made n.p.o.  at midnight Ancef 2 g ordered  Risks and benefits discussed with the patient including, but not limited to bleeding, infection, vascular injury, pneumothorax which may require chest tube placement.  All of the patient's questions were answered, patient is agreeable to proceed. Consent signed and in chart.  The procedure is tentatively scheduled for tomorrow pending IR schedule.   Thank you for this interesting consult.  I greatly enjoyed meeting MONTEE TALLMAN and look forward to participating in their care.  A copy of this report was sent to the requesting provider on this date.  Electronically Signed: Tera Mater, PA-C 09/03/2020, 10:23 AM   I spent a total of 40 Minutes    in face to face in clinical consultation, greater than 50% of which was counseling/coordinating care for fistulogram and possible tunneled hemodialysis catheter placement

## 2020-09-04 ENCOUNTER — Other Ambulatory Visit: Payer: Self-pay

## 2020-09-04 ENCOUNTER — Inpatient Hospital Stay (HOSPITAL_COMMUNITY): Payer: PPO

## 2020-09-04 HISTORY — PX: IR FLUORO GUIDE CV LINE RIGHT: IMG2283

## 2020-09-04 HISTORY — PX: IR US GUIDE VASC ACCESS RIGHT: IMG2390

## 2020-09-04 LAB — RENAL FUNCTION PANEL
Albumin: 2.3 g/dL — ABNORMAL LOW (ref 3.5–5.0)
Anion gap: 13 (ref 5–15)
BUN: 128 mg/dL — ABNORMAL HIGH (ref 8–23)
CO2: 18 mmol/L — ABNORMAL LOW (ref 22–32)
Calcium: 8 mg/dL — ABNORMAL LOW (ref 8.9–10.3)
Chloride: 99 mmol/L (ref 98–111)
Creatinine, Ser: 6.44 mg/dL — ABNORMAL HIGH (ref 0.61–1.24)
GFR, Estimated: 9 mL/min — ABNORMAL LOW (ref >60)
Glucose, Bld: 184 mg/dL — ABNORMAL HIGH (ref 70–99)
Phosphorus: 8.4 mg/dL — ABNORMAL HIGH (ref 2.5–4.6)
Potassium: 3.6 mmol/L (ref 3.5–5.1)
Sodium: 130 mmol/L — ABNORMAL LOW (ref 135–145)

## 2020-09-04 LAB — GLUCOSE, CAPILLARY
Glucose-Capillary: 154 mg/dL — ABNORMAL HIGH (ref 70–99)
Glucose-Capillary: 127 mg/dL — ABNORMAL HIGH (ref 70–99)
Glucose-Capillary: 151 mg/dL — ABNORMAL HIGH (ref 70–99)
Glucose-Capillary: 182 mg/dL — ABNORMAL HIGH (ref 70–99)

## 2020-09-04 LAB — BPAM RBC
Blood Product Expiration Date: 202208132359
ISSUE DATE / TIME: 202207240704
Unit Type and Rh: 6200

## 2020-09-04 LAB — CBC
HCT: 25.1 % — ABNORMAL LOW (ref 39.0–52.0)
Hemoglobin: 7.3 g/dL — ABNORMAL LOW (ref 13.0–17.0)
MCH: 23.2 pg — ABNORMAL LOW (ref 26.0–34.0)
MCHC: 29.1 g/dL — ABNORMAL LOW (ref 30.0–36.0)
MCV: 79.7 fL — ABNORMAL LOW (ref 80.0–100.0)
Platelets: 228 10*3/uL (ref 150–400)
RBC: 3.15 MIL/uL — ABNORMAL LOW (ref 4.22–5.81)
RDW: 17.2 % — ABNORMAL HIGH (ref 11.5–15.5)
WBC: 7.8 10*3/uL (ref 4.0–10.5)
nRBC: 0.3 % — ABNORMAL HIGH (ref 0.0–0.2)

## 2020-09-04 LAB — TYPE AND SCREEN
ABO/RH(D): A POS
Antibody Screen: NEGATIVE
Unit division: 0

## 2020-09-04 LAB — PARATHYROID HORMONE, INTACT (NO CA): PTH: 136 pg/mL — ABNORMAL HIGH (ref 15–65)

## 2020-09-04 LAB — HEPATITIS B SURFACE ANTIBODY, QUANTITATIVE: Hep B S AB Quant (Post): <3.1 m[IU]/mL — ABNORMAL LOW (ref >9.9)

## 2020-09-04 MED ORDER — HEPARIN SODIUM (PORCINE) 1000 UNIT/ML IJ SOLN
INTRAMUSCULAR | Status: AC
  Filled 2020-09-04: qty 1

## 2020-09-04 MED ORDER — CEFAZOLIN SODIUM-DEXTROSE 2-4 GM/100ML-% IV SOLN
INTRAVENOUS | Status: AC
  Filled 2020-09-04: qty 100

## 2020-09-04 MED ORDER — CEFAZOLIN SODIUM-DEXTROSE 2-4 GM/100ML-% IV SOLN
INTRAVENOUS | Status: AC | PRN
  Administered 2020-09-04: 2 g via INTRAVENOUS

## 2020-09-04 MED ORDER — MIDAZOLAM HCL 2 MG/2ML IJ SOLN
INTRAMUSCULAR | Status: AC
  Filled 2020-09-04: qty 2

## 2020-09-04 MED ORDER — FENTANYL CITRATE (PF) 100 MCG/2ML IJ SOLN
INTRAMUSCULAR | Status: AC
  Filled 2020-09-04: qty 2

## 2020-09-04 MED ORDER — LIDOCAINE HCL 1 % IJ SOLN
INTRAMUSCULAR | Status: AC
  Filled 2020-09-04: qty 20

## 2020-09-04 MED ORDER — FENTANYL CITRATE (PF) 100 MCG/2ML IJ SOLN
INTRAMUSCULAR | Status: AC | PRN
  Administered 2020-09-04: 25 ug via INTRAVENOUS

## 2020-09-04 MED ORDER — MIDAZOLAM HCL 2 MG/2ML IJ SOLN
INTRAMUSCULAR | Status: AC | PRN
  Administered 2020-09-04: 1 mg via INTRAVENOUS

## 2020-09-04 NOTE — Discharge Planning (Signed)
Records sent to [facility]. Will notify when chair is confirmed for outpatient dialysis.

## 2020-09-04 NOTE — Progress Notes (Signed)
Mobility Specialist: Progress Note   09/04/20 1125  Mobility  Activity Ambulated in hall  Level of Assistance Standby assist, set-up cues, supervision of patient - no hands on  Assistive Device Front wheel walker  Distance Ambulated (ft) 140 ft (70'x2)  Mobility Ambulated with assistance in hallway  Mobility Response Tolerated well  Mobility performed by Mobility specialist  Bed Position Chair  $Mobility charge 1 Mobility   Pre-Mobility: 68 HR, 97% SpO2 Post-Mobility: 67 HR, 132/78 BP, 100% SpO2  Pt did much better today and said he feels better than yesterday as well. Pt stopped for one standing break lasting 1-2 minutes d/t fatigue after 12ft, otherwise asx. Pt back to recliner after walk with call bell and phone within reach.   [facility] Rachna Schonberger Mobility Specialist Mobility Specialist Phone: [phone]

## 2020-09-04 NOTE — Procedures (Signed)
Interventional Radiology Procedure Note  Procedure: Tunneled hemodialysis catheter placement  Findings: Please refer to procedural dictation for full description. 23 cm Palindrome, tip in right atrium.  Complications: None immediate  Estimated Blood Loss: < 5 mL  Recommendations: Catheter ready for immediate use.   Ruthann Cancer, MD

## 2020-09-04 NOTE — Progress Notes (Signed)
PROGRESS NOTE    Travis Blair  CHY:[id number] DOB: July 18, 1950 DOA: 09/01/2020 PCP: [name], MD    Brief Narrative:  70 year old male with a history of chronic kidney disease stage V, diastolic heart failure, admitted to the hospital with worsening lower extremity edema and shortness of breath.  He was noted to have worsening renal failure.  He was seen by nephrology with recommendations to initiate dialysis to help manage his volume status.   Assessment & Plan:   Principal Problem:   Acute on chronic congestive heart failure (HCC) Active Problems:   S/P CABG x 3   Essential hypertension   CKD (chronic kidney disease) stage 5, GFR less than 15 ml/min (HCC)   Acute on chronic diastolic CHF (congestive heart failure) (HCC)   Acute on chronic kidney disease stage V -Patient presents with volume overload, elevated creatinine -He already has an AV fistula placed in preparation for dialysis -Seen by nephrology with recommendations to initiate dialysis -CLIP process to be initiated by [facility] -Continued on diuretics since he is still making urine -First HD session attempted on 7/24, but unfortunately was not successful with aVF -Patient to have tunneled dialysis catheter placed by interventional radiology today  Acute on chronic diastolic congestive heart failure with pulmonary hypertension -Volume removal to be accomplished through dialysis  Anemia -Suspect a large component from underlying renal disease -Started on iron infusion as well as ESA -Recently, he was noted to have heme positive stools by his PCP, he did not have any evidence of gross GI bleeding -He was to undergo outpatient GI work-up, but was unable to safely perform the studies due to his volume overload and inability to lay flat on the table -Further GI evaluation has been rescheduled for next month -We will recheck FOBT -Hemoglobin down to 6.7 this morning -Transfuse 1 unit of PRBC, follow-up  hemoglobin 7.9 -Continue to monitor hemoglobin and transfuse for hemoglobin less than 7  Recent history of right lower lobe PE and left lower extremity DVT diagnosed 12/2019 -Patient was taking Eliquis up until 06/2020 -This was discontinued when he was noted to have heme positive stools -Venous Dopplers of lower extremity rechecked and did not show any residual DVT  Atrial fibrillation -Heart rate is currently stable, on Coreg -Currently not on anticoagulation due to heme positive stools  Obstructive sleep apnea -Continue on CPAP  Coronary artery disease status post CABG -No chest pain at this time  Type 2 diabetes -Patient was having hypoglycemic episodes prior to admission -Currently is not on any maintenance medications -Blood sugars have been stable -Continue to follow CBG    DVT prophylaxis: heparin injection 5,000 Units Start: 09/01/20 2330  Code Status: Full code Family Communication: Discussed with his wife and daughter at the bedside Disposition Plan: Status is: Inpatient  Remains inpatient appropriate because:Ongoing diagnostic testing needed not appropriate for outpatient work up, IV treatments appropriate due to intensity of illness or inability to take PO, and Inpatient level of care appropriate due to severity of illness  Dispo: The patient is from: Home              Anticipated d/c is to: Home              Patient currently is not medically stable to d/c.   Difficult to place patient No         Consultants:  Nephrology Interventional radiology  Procedures:    Antimicrobials:      Subjective: Reports having a bowel  movement earlier today.  He did not look at the color.  Overall shortness of breath is better.  Currently n.p.o. for dialysis catheter placement.  Objective: Vitals:   09/04/20 1435 09/04/20 1445 09/04/20 1500 09/04/20 1510  BP: 133/66 126/70 136/67 140/73  Pulse: 62 60 69 71  Resp: 19 (!) _0 Temp:    97.6 F (36.4 C)   TempSrc:    Axillary  SpO2: 100% 96% 96% 99%  Weight:      Height:        Intake/Output Summary (Last 24 hours) at 09/04/2020 1907 Last data filed at 09/04/2020 1527 Gross per 24 hour  Intake 873.65 ml  Output 800 ml  Net 73.65 ml   Filed Weights   09/02/20 0624 09/03/20 0500 09/04/20 0247  Weight: 133.8 kg 132.8 kg 135.3 kg    Examination:  General exam: Alert, awake, oriented x 3 Respiratory system: Crackles at bases. Respiratory effort normal. Cardiovascular system:RRR. No murmurs, rubs, gallops. Gastrointestinal system: Abdomen is nondistended, soft and nontender. No organomegaly or masses felt. Normal bowel sounds heard. Central nervous system: Alert and oriented. No focal neurological deficits. Extremities: anasarca is present with 2+ edema, bruising noted in LUE around fistula site Skin: No rashes, lesions or ulcers Psychiatry: Judgement and insight appear normal. Mood & affect appropriate.      Data Reviewed: I have personally reviewed following labs and imaging studies  CBC: Recent Labs  Lab 09/01/20 2140 09/02/20 0012 09/02/20 0500 09/03/20 0116 09/03/20 1447 09/04/20 0127  WBC 8.5 7.8 7.0 7.4  --  7.8  NEUTROABS 6.7  --   --   --   --   --   HGB 8.1* 7.4* 7.4* 6.7* 7.9* 7.3*  HCT 27.6* 25.1* 26.0* 22.9* 26.3* 25.1*  MCV 79.1* 79.4* 80.2 78.4*  --  79.7*  PLT 252 229 254 209  --  751   Basic Metabolic Panel: Recent Labs  Lab 09/01/20 1819 09/02/20 0012 09/02/20 0500 09/03/20 0116 09/04/20 0127  NA 132*  --  131* 131* 130*  K 3.8  --  3.7 3.6 3.6  CL 97*  --  98 99 99  CO2 20*  --  21* 18* 18*  GLUCOSE 245*  --  185* 170* 184*  BUN 123*  --  120* 122* 128*  CREATININE 6.16* 6.27* 6.15* 6.10* 6.44*  CALCIUM 7.8*  --  7.9* 7.8* 8.0*  PHOS  --   --   --  8.3* 8.4*   GFR: Estimated Creatinine Clearance: 15.8 mL/min (A) (by C-G formula based on SCr of 6.44 mg/dL (H)). Liver Function Tests: Recent Labs  Lab 09/01/20 1819 09/03/20 0116  09/04/20 0127  AST 17  --   --   ALT 15  --   --   ALKPHOS 55  --   --   BILITOT 0.8  --   --   PROT 6.0*  --   --   ALBUMIN 2.5* 2.3* 2.3*   No results for input(s): LIPASE, AMYLASE in the last 168 hours. No results for input(s): AMMONIA in the last 168 hours. Coagulation Profile: No results for input(s): INR, PROTIME in the last 168 hours. Cardiac Enzymes: No results for input(s): CKTOTAL, CKMB, CKMBINDEX, TROPONINI in the last 168 hours. BNP (last 3 results) No results for input(s): PROBNP in the last 8760 hours. HbA1C: No results for input(s): HGBA1C in the last 72 hours. CBG: Recent Labs  Lab 09/03/20 1706 09/03/20 2112 09/04/20 0258 09/04/20 1215 09/04/20  [place]   Lipid Profile: No results for input(s): CHOL, HDL, LDLCALC, TRIG, CHOLHDL, LDLDIRECT in the last 72 hours. Thyroid Function Tests: No results for input(s): TSH, T4TOTAL, FREET4, T3FREE, THYROIDAB in the last 72 hours. Anemia Panel: No results for input(s): VITAMINB12, FOLATE, FERRITIN, TIBC, IRON, RETICCTPCT in the last 72 hours. Sepsis Labs: No results for input(s): PROCALCITON, LATICACIDVEN in the last 168 hours.  Recent Results (from the past 240 hour(s))  Resp Panel by RT-PCR (Flu A&B, Covid) Nasopharyngeal Swab     Status: None   Collection Time: 09/01/20  9:06 PM   Specimen: Nasopharyngeal Swab; Nasopharyngeal(NP) swabs in vial transport medium  Result Value Ref Range Status   SARS Coronavirus 2 by RT PCR NEGATIVE NEGATIVE Final    Comment: (NOTE) SARS-CoV-2 target nucleic acids are NOT DETECTED.  The SARS-CoV-2 RNA is generally detectable in upper respiratory specimens during the acute phase of infection. The lowest concentration of SARS-CoV-2 viral copies this assay can detect is 138 copies/mL. A negative result does not preclude SARS-Cov-2 infection and should not be used as the sole basis for treatment or other patient management decisions. A negative result  may occur with  improper specimen collection/handling, submission of specimen other than nasopharyngeal swab, presence of viral mutation(s) within the areas targeted by this assay, and inadequate number of viral copies(<138 copies/mL). A negative result must be combined with clinical observations, patient history, and epidemiological information. The expected result is Negative.  Fact Sheet for Patients:  EntrepreneurPulse.com.au  Fact Sheet for Healthcare Providers:  IncredibleEmployment.be  This test is no t yet approved or cleared by the [place] FDA and  has been authorized for detection and/or diagnosis of SARS-CoV-2 by FDA under an Emergency Use Authorization (EUA). This EUA will remain  in effect (meaning this test can be used) for the duration of the COVID-19 declaration under Section 564(b)(1) of the Act, 21 U.S.C.section 360bbb-3(b)(1), unless the authorization is terminated  or revoked sooner.       Influenza A by PCR NEGATIVE NEGATIVE Final   Influenza B by PCR NEGATIVE NEGATIVE Final    Comment: (NOTE) The Xpert Xpress SARS-CoV-2/FLU/RSV plus assay is intended as an aid in the diagnosis of influenza from Nasopharyngeal swab specimens and should not be used as a sole basis for treatment. Nasal washings and aspirates are unacceptable for Xpert Xpress SARS-CoV-2/FLU/RSV testing.  Fact Sheet for Patients: EntrepreneurPulse.com.au  Fact Sheet for Healthcare Providers: IncredibleEmployment.be  This test is not yet approved or cleared by the [place] FDA and has been authorized for detection and/or diagnosis of SARS-CoV-2 by FDA under an Emergency Use Authorization (EUA). This EUA will remain in effect (meaning this test can be used) for the duration of the COVID-19 declaration under Section 564(b)(1) of the Act, 21 U.S.C. section 360bbb-3(b)(1), unless the authorization is terminated  or revoked.  Performed at [facility], [place] [address] [place], [place] [address]          Radiology Studies: IR Fluoro Guide CV Line Right  Result Date: 09/04/2020 INDICATION: 70 year old male with end-stage renal disease and malfunctioning left upper extremity fistula. EXAM: TUNNELED CENTRAL VENOUS HEMODIALYSIS CATHETER PLACEMENT WITH ULTRASOUND AND FLUOROSCOPIC GUIDANCE MEDICATIONS: Ancef 2 gm IV . The antibiotic was given in an appropriate time interval prior to skin puncture. ANESTHESIA/SEDATION: Moderate (conscious) sedation was employed during this procedure. A total of Versed 1 mg and Fentanyl 25 mcg was administered intravenously. Moderate Sedation Time: 15 minutes.  The patient's level of consciousness and vital signs were monitored continuously by radiology nursing throughout the procedure under my direct supervision. FLUOROSCOPY TIME:  0 minutes 12 seconds (7 mGy). COMPLICATIONS: None immediate. PROCEDURE: Informed written consent was obtained from the patient after a discussion of the risks, benefits, and alternatives to treatment. Questions regarding the procedure were encouraged and answered. The right neck and chest were prepped with chlorhexidine in a sterile fashion, and a sterile drape was applied covering the operative field. Maximum barrier sterile technique with sterile gowns and gloves were used for the procedure. A timeout was performed prior to the initiation of the procedure. After creating a small venotomy incision, a 21 gauge micropuncture kit was utilized to access the internal jugular vein. Real-time ultrasound guidance was utilized for vascular access including the acquisition of a permanent ultrasound image documenting patency of the accessed vessel. A Rosen wire was advanced to the level of the IVC and the micropuncture sheath was exchanged for an 8 Fr dilator. A 14.5 French tunneled hemodialysis catheter measuring 23 cm from tip to cuff was tunneled in a  retrograde fashion from the anterior chest wall to the venotomy incision. Serial dilation was then performed an a peel-away sheath was placed. The catheter was then placed through the peel-away sheath with the catheter tip ultimately positioned within the right atrium. Final catheter positioning was confirmed and documented with a spot radiographic image. The catheter aspirates and flushes normally. The catheter was flushed with appropriate volume heparin dwells. The catheter exit site was secured with a 0-Silk retention suture. The venotomy incision was closed with Dermabond. Sterile dressings were applied. The patient tolerated the procedure well without immediate post procedural complication. IMPRESSION: Successful placement of 23 cm tip to cuff tunneled hemodialysis catheter via the right internal jugular vein with catheter tip terminating within the right atrium. The catheter is ready for immediate use. Ruthann Cancer, MD [facility] Radiology Electronically Signed   By: Ruthann Cancer MD   On: 09/04/2020 15:01   IR US Guide Vasc Access Right  Result Date: 09/04/2020 INDICATION: 70 year old male with end-stage renal disease and malfunctioning left upper extremity fistula. EXAM: TUNNELED CENTRAL VENOUS HEMODIALYSIS CATHETER PLACEMENT WITH ULTRASOUND AND FLUOROSCOPIC GUIDANCE MEDICATIONS: Ancef 2 gm IV . The antibiotic was given in an appropriate time interval prior to skin puncture. ANESTHESIA/SEDATION: Moderate (conscious) sedation was employed during this procedure. A total of Versed 1 mg and Fentanyl 25 mcg was administered intravenously. Moderate Sedation Time: 15 minutes. The patient's level of consciousness and vital signs were monitored continuously by radiology nursing throughout the procedure under my direct supervision. FLUOROSCOPY TIME:  0 minutes 12 seconds (7 mGy). COMPLICATIONS: None immediate. PROCEDURE: Informed written consent was obtained from  the patient after a discussion of the risks, benefits, and alternatives to treatment. Questions regarding the procedure were encouraged and answered. The right neck and chest were prepped with chlorhexidine in a sterile fashion, and a sterile drape was applied covering the operative field. Maximum barrier sterile technique with sterile gowns and gloves were used for the procedure. A timeout was performed prior to the initiation of the procedure. After creating a small venotomy incision, a 21 gauge micropuncture kit was utilized to access the internal jugular vein. Real-time ultrasound guidance was utilized for vascular access including the acquisition of a permanent ultrasound image documenting patency of the accessed vessel. A Rosen wire was advanced to the level of the IVC and the micropuncture sheath was exchanged for  an 8 Fr dilator. A 14.5 French tunneled hemodialysis catheter measuring 23 cm from tip to cuff was tunneled in a retrograde fashion from the anterior chest wall to the venotomy incision. Serial dilation was then performed an a peel-away sheath was placed. The catheter was then placed through the peel-away sheath with the catheter tip ultimately positioned within the right atrium. Final catheter positioning was confirmed and documented with a spot radiographic image. The catheter aspirates and flushes normally. The catheter was flushed with appropriate volume heparin dwells. The catheter exit site was secured with a 0-Silk retention suture. The venotomy incision was closed with Dermabond. Sterile dressings were applied. The patient tolerated the procedure well without immediate post procedural complication. IMPRESSION: Successful placement of 23 cm tip to cuff tunneled hemodialysis catheter via the right internal jugular vein with catheter tip terminating within the right atrium. The catheter is ready for immediate use. Ruthann Cancer, MD [facility]  Radiology Electronically Signed   By: Ruthann Cancer MD   On: 09/04/2020 15:01        Scheduled Meds:  calcium acetate  1,334 mg Oral TID WC   carvedilol  6.25 mg Oral BID WC   Chlorhexidine Gluconate Cloth  6 each Topical Q0600   [START ON 09/11/2020] darbepoetin (ARANESP) injection - DIALYSIS  200 mcg Intravenous Q Mon-HD   heparin  5,000 Units Subcutaneous Q8H   Continuous Infusions:  ferric gluconate (FERRLECIT) IVPB 250 mg (09/04/20 1014)   furosemide 160 mg (09/04/20 1633)     LOS: 3 days    Time spent: 6mns    JKathie Dike MD [facility]   If 7PM-7AM, please contact night-coverage [email or web address]  09/04/2020, 7:07 PM

## 2020-09-04 NOTE — Progress Notes (Signed)
Pt hemodialysis tx move to tomorrow by Dr. Jonnie Finner ordered.

## 2020-09-04 NOTE — Progress Notes (Signed)
[facility] Progress Note   70 year old WM with cardiac issues as well as advanced CKD-  presents with volume overload, anemia and uremia  Assessment/ Plan:   1.Renal- advanced CKD at baseline-  have been prepping for dialysis as OP.  Now with volume overload refractory to meds, and uremia.  Agrees to start dialysis.  Unfortunately  first treatment was not successful Saturday  with AVF.  Will need to consult IR for   [facility] placement; I would hold off on a fistulogram tiill the infiltration settles down in 2 weeks (warm compress). - Appreciate VIR agreeing to place the TC (preferably on right side) - Lives in [place] so can CLIP to [place] OP unit-  also may be able to eventually do home HD. I notified Pam CLIP today  2. Hypertension/volume  - massive volume overload.  On iv lasix for now, making some urine so will continue and increase to max dose -  will do UF with HD also for volume once can establish  - Once on HD will use UNNA boot to help mobilize  3. Anemia  - significant and not helping his DOE and weakness-  will load with iron and give ESA-  transfusion on 7/23  4. Bones-  check PTH- pending  phos up (8.4) -  just started phoslo 2 tabs TIDM  Subjective:   Unable to use AVF for treatment Saturday and e/o infiltration. He denies dyspnea or nausea and main complaint is hunger.    Objective:   BP 132/68 (BP Location: Right Arm)   Pulse (!) 43   Temp 97.6 F (36.4 C) (Oral)   Resp 20   Ht 6\' 2"  (1.88 m)   Wt 135.3 kg   SpO2 95%   BMI 38.30 kg/m   Intake/Output Summary (Last 24 hours) at 09/04/2020 1232 Last data filed at 09/04/2020 0516 Gross per 24 hour  Intake 540.68 ml  Output 400 ml  Net 140.68 ml   Weight change: 2.5 kg  Physical Exam: General: sitting up in bedside chair-  pale, fatigued Heart: RRR Lungs: mostly clear Abdomen: obese, soft, non tender Extremities: pitting edema and LE weeping  Dialysis Access: left AVF -  much bruising-   good thrill and bruit    Imaging: VAS [place] LOWER EXTREMITY VENOUS (DVT)  Result Date: 09/03/2020  Lower Venous DVT Study Patient Name:  DIAMOND MARTUCCI [facility]  Date of Exam:   09/02/2020 Medical Rec #: [id number]            Accession #:    [id number] Date of Birth: 1950-09-24            Patient Gender: M Patient Age:   70Y Exam Location:  [facility] Procedure:      VAS [place] LOWER EXTREMITY VENOUS (DVT) Referring Phys: [id number] JEHANZEB MEMON --------------------------------------------------------------------------------  Indications: Weeping edema., and SOB.  Risk Factors: PE 12/16/19 CKD V, CAD, CHF, pulmonary vascular hypertension, anasarca. Limitations: Significant pitting edema throughout, open, bleeding sores, and pain in calves. Comparison Study: Negative bilateral lower extremity duplex done 12/22/19 Performing Technologist: Sharion Dove RVS  Examination Guidelines: A complete evaluation includes B-mode imaging, spectral Doppler, color Doppler, and power Doppler as needed of all accessible portions of each vessel. Bilateral testing is considered an integral part of a complete examination. Limited examinations for reoccurring indications may be performed as noted. The reflux portion of the exam is performed with the patient in reverse Trendelenburg.  +---------+---------------+---------+-----------+----------+--------------+ RIGHT    CompressibilityPhasicitySpontaneityPropertiesThrombus Aging +---------+---------------+---------+-----------+----------+--------------+  CFV      Full           Yes      Yes                                 +---------+---------------+---------+-----------+----------+--------------+ SFJ      Full                                                        +---------+---------------+---------+-----------+----------+--------------+ FV Prox  Full                                                         +---------+---------------+---------+-----------+----------+--------------+ FV Mid   Full                                                        +---------+---------------+---------+-----------+----------+--------------+ FV DistalFull                                                        +---------+---------------+---------+-----------+----------+--------------+ PFV      Full                                                        +---------+---------------+---------+-----------+----------+--------------+ POP      Full           Yes      Yes                                 +---------+---------------+---------+-----------+----------+--------------+ PTV      Full                                                        +---------+---------------+---------+-----------+----------+--------------+ PERO     Full                                                        +---------+---------------+---------+-----------+----------+--------------+   +---------+---------------+---------+-----------+----------+-------------------+ LEFT     CompressibilityPhasicitySpontaneityPropertiesThrombus Aging      +---------+---------------+---------+-----------+----------+-------------------+ CFV      Full           Yes      Yes                                      +---------+---------------+---------+-----------+----------+-------------------+  SFJ      Full                                                             +---------+---------------+---------+-----------+----------+-------------------+ FV Prox  Full                                                             +---------+---------------+---------+-----------+----------+-------------------+ FV Mid                  Yes      Yes                  patent by color and                                                       Doppler              +---------+---------------+---------+-----------+----------+-------------------+ FV Distal               Yes      Yes                  patent by color and                                                       Doppler             +---------+---------------+---------+-----------+----------+-------------------+ POP      Full           Yes      Yes                                      +---------+---------------+---------+-----------+----------+-------------------+ PTV                                                   Not well visualized +---------+---------------+---------+-----------+----------+-------------------+ PERO                                                  Not well visualized +---------+---------------+---------+-----------+----------+-------------------+     Summary: RIGHT: - There is no evidence of deep vein thrombosis in the lower extremity. However, portions of this examination were limited- see technologist comments above.  LEFT: - There is no evidence of deep vein thrombosis in the lower extremity. However, portions of this examination were limited- see technologist comments above.  *See table(s) above for measurements and observations. Electronically signed by Marcello Moores  Hawken on 09/03/2020 at 9:20:49 AM.    Final     Labs: BMET Recent Labs  Lab 09/01/20 1819 09/02/20 0012 09/02/20 0500 09/03/20 0116 09/04/20 0127  NA 132*  --  131* 131* 130*  K 3.8  --  3.7 3.6 3.6  CL 97*  --  98 99 99  CO2 20*  --  21* 18* 18*  GLUCOSE 245*  --  185* 170* 184*  BUN 123*  --  120* 122* 128*  CREATININE 6.16* 6.27* 6.15* 6.10* 6.44*  CALCIUM 7.8*  --  7.9* 7.8* 8.0*  PHOS  --   --   --  8.3* 8.4*   CBC Recent Labs  Lab 09/01/20 2140 09/02/20 0012 09/02/20 0500 09/03/20 0116 09/03/20 1447 09/04/20 0127  WBC 8.5 7.8 7.0 7.4  --  7.8  NEUTROABS 6.7  --   --   --   --   --   HGB 8.1* 7.4* 7.4* 6.7* 7.9* 7.3*  HCT 27.6* 25.1* 26.0* 22.9* 26.3* 25.1*  MCV  79.1* 79.4* 80.2 78.4*  --  79.7*  PLT 252 229 254 209  --  228    Medications:     calcium acetate  1,334 mg Oral TID WC   carvedilol  6.25 mg Oral BID WC   Chlorhexidine Gluconate Cloth  6 each Topical Q0600   [START ON 09/11/2020] darbepoetin (ARANESP) injection - DIALYSIS  200 mcg Intravenous Q Mon-HD   heparin  5,000 Units Subcutaneous Q8H      Otelia Santee, MD 09/04/2020, 12:32 PM

## 2020-09-04 NOTE — Progress Notes (Signed)
Pt arrived from IR, VSS, call light within reach, HD cath clean dry and intact.   Chrisandra Carota, RN 09/04/2020 3:33 PM

## 2020-09-04 NOTE — Progress Notes (Signed)
Heart Failure Navigator Progress Note  Assessed for Heart & Vascular TOC clinic readiness.  Unfortunately at this time the patient does not meet criteria due to advanced CKD, starting HD this admision.   Navigator available for reassessment of patient.   Kerby Nora, PharmD, BCPS Heart Failure Stewardship Pharmacist Phone [phone]

## 2020-09-05 LAB — RENAL FUNCTION PANEL
Albumin: 2.4 g/dL — ABNORMAL LOW (ref 3.5–5.0)
Anion gap: 14 (ref 5–15)
BUN: 127 mg/dL — ABNORMAL HIGH (ref 8–23)
CO2: 20 mmol/L — ABNORMAL LOW (ref 22–32)
Calcium: 8 mg/dL — ABNORMAL LOW (ref 8.9–10.3)
Chloride: 97 mmol/L — ABNORMAL LOW (ref 98–111)
Creatinine, Ser: 6.61 mg/dL — ABNORMAL HIGH (ref 0.61–1.24)
GFR, Estimated: 8 mL/min — ABNORMAL LOW (ref >60)
Glucose, Bld: 203 mg/dL — ABNORMAL HIGH (ref 70–99)
Phosphorus: 8.4 mg/dL — ABNORMAL HIGH (ref 2.5–4.6)
Potassium: 3.9 mmol/L (ref 3.5–5.1)
Sodium: 131 mmol/L — ABNORMAL LOW (ref 135–145)

## 2020-09-05 LAB — GLUCOSE, CAPILLARY
Glucose-Capillary: 161 mg/dL — ABNORMAL HIGH (ref 70–99)
Glucose-Capillary: 186 mg/dL — ABNORMAL HIGH (ref 70–99)
Glucose-Capillary: 237 mg/dL — ABNORMAL HIGH (ref 70–99)

## 2020-09-05 LAB — CBC
HCT: 26.9 % — ABNORMAL LOW (ref 39.0–52.0)
Hemoglobin: 7.7 g/dL — ABNORMAL LOW (ref 13.0–17.0)
MCH: 23.3 pg — ABNORMAL LOW (ref 26.0–34.0)
MCHC: 28.6 g/dL — ABNORMAL LOW (ref 30.0–36.0)
MCV: 81.3 fL (ref 80.0–100.0)
Platelets: 229 10*3/uL (ref 150–400)
RBC: 3.31 MIL/uL — ABNORMAL LOW (ref 4.22–5.81)
RDW: 17.3 % — ABNORMAL HIGH (ref 11.5–15.5)
WBC: 9 10*3/uL (ref 4.0–10.5)
nRBC: 0 % (ref 0.0–0.2)

## 2020-09-05 LAB — HEPATITIS B SURFACE ANTIGEN: Hepatitis B Surface Ag: NONREACTIVE

## 2020-09-05 LAB — HEPATITIS B CORE ANTIBODY, TOTAL: Hep B Core Total Ab: NONREACTIVE

## 2020-09-05 LAB — HEPATITIS B SURFACE ANTIBODY,QUALITATIVE: Hep B S Ab: NONREACTIVE

## 2020-09-05 MED ORDER — HEPARIN SODIUM (PORCINE) 1000 UNIT/ML IJ SOLN
INTRAMUSCULAR | Status: AC
  Filled 2020-09-05: qty 4

## 2020-09-05 MED ORDER — SODIUM CHLORIDE 0.9 % IV SOLN: 100.0000 mL | INTRAVENOUS | Status: DC | PRN

## 2020-09-05 MED ORDER — PANTOPRAZOLE SODIUM 40 MG PO TBEC
40.0000 mg | DELAYED_RELEASE_TABLET | Freq: Every day | ORAL | Status: DC
  Administered 2020-09-05 – 2020-09-08 (×4): 40 mg via ORAL
  Filled 2020-09-05 (×4): qty 1

## 2020-09-05 MED ORDER — HEPARIN SODIUM (PORCINE) 1000 UNIT/ML DIALYSIS
1000.0000 [IU] | INTRAMUSCULAR | Status: DC | PRN
  Administered 2020-09-05: 1000 [IU] via INTRAVENOUS_CENTRAL

## 2020-09-05 MED ORDER — ALTEPLASE 2 MG IJ SOLR: 2.0000 mg | Freq: Once | INTRAMUSCULAR | Status: DC | PRN

## 2020-09-05 MED ORDER — HYDRALAZINE HCL 20 MG/ML IJ SOLN
10.0000 mg | Freq: Four times a day (QID) | INTRAMUSCULAR | Status: DC | PRN
  Filled 2020-09-05: qty 1

## 2020-09-05 MED ORDER — PENTAFLUOROPROP-TETRAFLUOROETH EX AERO: 1.0000 "application " | INHALATION_SPRAY | CUTANEOUS | Status: DC | PRN

## 2020-09-05 MED ORDER — LIDOCAINE HCL (PF) 1 % IJ SOLN: 5.0000 mL | INTRAMUSCULAR | Status: DC | PRN

## 2020-09-05 MED ORDER — LIDOCAINE-PRILOCAINE 2.5-2.5 % EX CREA: 1.0000 "application " | TOPICAL_CREAM | CUTANEOUS | Status: DC | PRN

## 2020-09-05 NOTE — Progress Notes (Signed)
Hemodialysis- Patient tolerated first treatment well without issue. UF 1L as ordered. All questions about HD answered to patient satisfaction. To have next treatment tomorrow per MD. Report called to primary RN, Lanelle Bal.

## 2020-09-05 NOTE — Progress Notes (Signed)
Mobility Specialist: Progress Note   09/05/20 1436  Mobility  Activity Ambulated in hall  Level of Assistance Minimal assist, patient does 75% or more  Assistive Device Front wheel walker  Distance Ambulated (ft) 252 ft [phone]  Mobility Ambulated with assistance in hallway  Mobility Response Tolerated well  Mobility performed by Mobility specialist  Bed Position Chair  $Mobility charge 1 Mobility   Post-Mobility: 78 HR, 159/77 BP  Pt sitting EOB upon entering room and agreeable to ambulate. Pt required minA to stand from EOB. Pt stopped for two standing breaks lasting roughly 1 minute each between distances above d/t c/o fatigue in his BLE. Pt to recliner after walk with pt's wife and RN present in the room.   [facility] Fordyce Lepak Mobility Specialist Mobility Specialist Phone: [phone]

## 2020-09-05 NOTE — Progress Notes (Signed)
[facility] Progress Note   70 year old WM with cardiac issues as well as advanced CKD-  presents with volume overload, anemia and uremia  Assessment/ Plan:   1.Renal- advanced CKD at baseline-  have been prepping for dialysis as OP.  Now with volume overload refractory to meds, and uremia.  Agrees to start dialysis.  Unfortunately  first treatment was not successful Saturday  with AVF.  Will need to consult IR for   [facility] placement; I would hold off on a fistulogram tiill the infiltration settles down in 2 weeks (warm compress).  - Appreciate VIR  placing the [facility] TC on 7/25. - Lives in [place] so can CLIP to [place] OP unit-  also may be able to eventually do home HD. I notified Pam CLIP on 7/25.  Seen on HD through [facility] TC 4K bath net UF goal 1L Tolerating  Plan on next HD tomorrow and then either Thur + Sat or Fri + Sat mainly for UF.  UNNA boots as well to help mobilize the fluid.  2. Hypertension/volume  - massive volume overload.  On iv lasix for now, making some urine so will continue and increase to max dose -  will do UF with HD also for volume once can establish   3. Anemia  - significant and not helping his DOE and weakness-  will load with iron and give ESA-  transfusion on 7/23  4. Bones-  check PTH- pending  phos up (8.4) -  just started phoslo 2 tabs TIDM. Will recheck phos on Thur.  Subjective:   Unable to use AVF for treatment Saturday and e/o infiltration. He denies dyspnea or nausea  and is tolerating the HD treatment this am.     Objective:   BP 123/65   Pulse 67   Temp 97.8 F (36.6 C) (Oral)   Resp 17   Ht _0  (1.88 m)   Wt 135.5 kg   SpO2 100%   BMI 38.35 kg/m   Intake/Output Summary (Last 24 hours) at 09/05/2020 0941 Last data filed at 09/05/2020 4081 Gross per 24 hour  Intake 332.97 ml  Output 1000 ml  Net -667.03 ml   Weight change: 0.189 kg  Physical Exam: General: sitting up in bedside chair-  pale, fatigued Heart:  RRR Lungs: mostly clear Abdomen: obese, soft, non tender Extremities: pitting edema and LE weeping  Dialysis Access: left AVF -  much bruising-  good thrill and bruit  , [facility] TC  Imaging: IR Fluoro Guide CV Line Right  Result Date: 09/04/2020 INDICATION: 70 year old male with end-stage renal disease and malfunctioning left upper extremity fistula. EXAM: TUNNELED CENTRAL VENOUS HEMODIALYSIS CATHETER PLACEMENT WITH ULTRASOUND AND FLUOROSCOPIC GUIDANCE MEDICATIONS: Ancef 2 gm IV . The antibiotic was given in an appropriate time interval prior to skin puncture. ANESTHESIA/SEDATION: Moderate (conscious) sedation was employed during this procedure. A total of Versed 1 mg and Fentanyl 25 mcg was administered intravenously. Moderate Sedation Time: 15 minutes. The patient's level of consciousness and vital signs were monitored continuously by radiology nursing throughout the procedure under my direct supervision. FLUOROSCOPY TIME:  0 minutes 12 seconds (7 mGy). COMPLICATIONS: None immediate. PROCEDURE: Informed written consent was obtained from the patient after a discussion of the risks, benefits, and alternatives to treatment. Questions regarding the procedure were encouraged and answered. The right neck and chest were prepped with chlorhexidine in a sterile fashion, and a sterile drape was applied covering the operative field. Maximum barrier sterile technique with  sterile gowns and gloves were used for the procedure. A timeout was performed prior to the initiation of the procedure. After creating a small venotomy incision, a 21 gauge micropuncture kit was utilized to access the internal jugular vein. Real-time ultrasound guidance was utilized for vascular access including the acquisition of a permanent ultrasound image documenting patency of the accessed vessel. A Rosen wire was advanced to the level of the IVC and the micropuncture sheath was exchanged for an 8 Fr dilator. A 14.5 French tunneled hemodialysis  catheter measuring 23 cm from tip to cuff was tunneled in a retrograde fashion from the anterior chest wall to the venotomy incision. Serial dilation was then performed an a peel-away sheath was placed. The catheter was then placed through the peel-away sheath with the catheter tip ultimately positioned within the right atrium. Final catheter positioning was confirmed and documented with a spot radiographic image. The catheter aspirates and flushes normally. The catheter was flushed with appropriate volume heparin dwells. The catheter exit site was secured with a 0-Silk retention suture. The venotomy incision was closed with Dermabond. Sterile dressings were applied. The patient tolerated the procedure well without immediate post procedural complication. IMPRESSION: Successful placement of 23 cm tip to cuff tunneled hemodialysis catheter via the right internal jugular vein with catheter tip terminating within the right atrium. The catheter is ready for immediate use. Ruthann Cancer, MD [facility] Radiology Electronically Signed   By: Ruthann Cancer MD   On: 09/04/2020 15:01   IR US Guide Vasc Access Right  Result Date: 09/04/2020 INDICATION: 70 year old male with end-stage renal disease and malfunctioning left upper extremity fistula. EXAM: TUNNELED CENTRAL VENOUS HEMODIALYSIS CATHETER PLACEMENT WITH ULTRASOUND AND FLUOROSCOPIC GUIDANCE MEDICATIONS: Ancef 2 gm IV . The antibiotic was given in an appropriate time interval prior to skin puncture. ANESTHESIA/SEDATION: Moderate (conscious) sedation was employed during this procedure. A total of Versed 1 mg and Fentanyl 25 mcg was administered intravenously. Moderate Sedation Time: 15 minutes. The patient's level of consciousness and vital signs were monitored continuously by radiology nursing throughout the procedure under my direct supervision. FLUOROSCOPY TIME:  0 minutes 12 seconds (7 mGy). COMPLICATIONS: None  immediate. PROCEDURE: Informed written consent was obtained from the patient after a discussion of the risks, benefits, and alternatives to treatment. Questions regarding the procedure were encouraged and answered. The right neck and chest were prepped with chlorhexidine in a sterile fashion, and a sterile drape was applied covering the operative field. Maximum barrier sterile technique with sterile gowns and gloves were used for the procedure. A timeout was performed prior to the initiation of the procedure. After creating a small venotomy incision, a 21 gauge micropuncture kit was utilized to access the internal jugular vein. Real-time ultrasound guidance was utilized for vascular access including the acquisition of a permanent ultrasound image documenting patency of the accessed vessel. A Rosen wire was advanced to the level of the IVC and the micropuncture sheath was exchanged for an 8 Fr dilator. A 14.5 French tunneled hemodialysis catheter measuring 23 cm from tip to cuff was tunneled in a retrograde fashion from the anterior chest wall to the venotomy incision. Serial dilation was then performed an a peel-away sheath was placed. The catheter was then placed through the peel-away sheath with the catheter tip ultimately positioned within the right atrium. Final catheter positioning was confirmed and documented with a spot radiographic image. The catheter aspirates and flushes normally. The catheter was flushed with appropriate volume heparin dwells.  The catheter exit site was secured with a 0-Silk retention suture. The venotomy incision was closed with Dermabond. Sterile dressings were applied. The patient tolerated the procedure well without immediate post procedural complication. IMPRESSION: Successful placement of 23 cm tip to cuff tunneled hemodialysis catheter via the right internal jugular vein with catheter tip terminating within the right atrium. The catheter is ready for immediate use. Ruthann Cancer, MD  [facility] Radiology Electronically Signed   By: Ruthann Cancer MD   On: 09/04/2020 15:01    Labs: BMET Recent Labs  Lab 09/01/20 1819 09/02/20 0012 09/02/20 0500 09/03/20 0116 09/04/20 0127 09/05/20 0133  NA 132*  --  131* 131* 130* 131*  K 3.8  --  3.7 3.6 3.6 3.9  CL 97*  --  98 99 99 97*  CO2 20*  --  21* 18* 18* 20*  GLUCOSE 245*  --  185* 170* 184* 203*  BUN 123*  --  120* 122* 128* 127*  CREATININE 6.16* 6.27* 6.15* 6.10* 6.44* 6.61*  CALCIUM 7.8*  --  7.9* 7.8* 8.0* 8.0*  PHOS  --   --   --  8.3* 8.4* 8.4*   CBC Recent Labs  Lab 09/01/20 2140 09/02/20 0012 09/02/20 0500 09/03/20 0116 09/03/20 1447 09/04/20 0127 09/05/20 0133  WBC 8.5   < > 7.0 7.4  --  7.8 9.0  NEUTROABS 6.7  --   --   --   --   --   --   HGB 8.1*   < > 7.4* 6.7* 7.9* 7.3* 7.7*  HCT 27.6*   < > 26.0* 22.9* 26.3* 25.1* 26.9*  MCV 79.1*   < > 80.2 78.4*  --  79.7* 81.3  PLT 252   < > 254 209  --  228 229   < > = values in this interval not displayed.    Medications:     calcium acetate  1,334 mg Oral TID WC   carvedilol  6.25 mg Oral BID WC   Chlorhexidine Gluconate Cloth  6 each Topical Q0600   [START ON 09/11/2020] darbepoetin (ARANESP) injection - DIALYSIS  200 mcg Intravenous Q Mon-HD   heparin  5,000 Units Subcutaneous Q8H      Otelia Santee, MD 09/05/2020, 9:41 AM

## 2020-09-05 NOTE — Progress Notes (Addendum)
PROGRESS NOTE    Travis Blair  IWL:[id number] DOB: 08-23-1950 DOA: 09/01/2020 PCP: [name], MD    Brief Narrative:  70 year old male with a history of chronic kidney disease stage V, diastolic heart failure, admitted to the hospital with worsening lower extremity edema and shortness of breath.  He was noted to have worsening renal failure.  He was seen by nephrology with recommendations to initiate dialysis to help manage his volume status.   Assessment & Plan:   Principal Problem:   Acute on chronic congestive heart failure (HCC) Active Problems:   S/P CABG x 3   Essential hypertension   CKD (chronic kidney disease) stage 5, GFR less than 15 ml/min (HCC)   Acute on chronic diastolic CHF (congestive heart failure) (HCC)   Acute on chronic kidney disease stage V -Patient presents with volume overload, elevated creatinine -He already has an AV fistula placed in preparation for dialysis -Seen by nephrology with recommendations to initiate dialysis -CLIP in process to arrange outpatient dialysis in [place] -Continued on diuretics since he is still making urine -First HD session attempted on 7/24, but unfortunately was not successful with aVF -Patient had tunneled dialysis catheter placed by interventional radiology  -First HD session done on 7/26 -We will need fistulogram over the next few weeks to reevaluate fistula -We will likely have another dialysis treatment tomorrow  Acute on chronic diastolic congestive heart failure with pulmonary hypertension -Volume removal to be accomplished through dialysis  Hypertension -Noted to have Coreg on his PTA med list -Patient reports that this was discontinued by his primary care physician due to concerns for bradycardia -Heart rate currently running in the 60s -We will hold further Coreg -Blood pressure is elevated at times, but hopefully this will improve with continued volume removal -Use as needed hydralazine for  now -May need addition of further antihypertensives prior to discharge, depending on trend of blood pressures  Anemia -Suspect a large component from underlying renal disease -Started on iron infusion as well as ESA -Recently, he was noted to have heme positive stools by his PCP, he did not have any evidence of gross GI bleeding -He was to undergo outpatient GI work-up, but was unable to safely perform the studies due to his volume overload and inability to lay flat on the table -Further GI evaluation has been rescheduled for next month -We will recheck FOBT -Hemoglobin down to nadir of 6.7 during this admission -Transfused 1 unit of PRBC on 7/24, follow-up hemoglobin has improved and is currently 7.7, trending up -Continue to monitor hemoglobin and transfuse for hemoglobin less than 7  Recent history of right lower lobe PE and left lower extremity DVT diagnosed 12/2019 -Patient was taking Eliquis up until 06/2020 -This was discontinued when he was noted to have heme positive stools -Venous Dopplers of lower extremity rechecked and did not show any residual DVT -Suspect VTE has been adequately treated with 6 months of anticoagulation  Atrial fibrillation -Heart rate is currently stable, on Coreg -He was taken off anticoagulation by his primary care physician due to having heme positive stools -Outpatient GI evaluation has been scheduled  Obstructive sleep apnea -Continue on CPAP  Coronary artery disease status post CABG -No chest pain at this time  Type 2 diabetes -Patient was having hypoglycemic episodes prior to admission -Currently is not on any maintenance medications -Blood sugars have been stable -Continue to follow CBG    DVT prophylaxis: heparin injection 5,000 Units Start: 09/01/20 2330  Code Status:  Full code Family Communication: Discussed with patient, no family present at Disposition Plan: Status is: Inpatient  Remains inpatient appropriate because:Ongoing  diagnostic testing needed not appropriate for outpatient work up, IV treatments appropriate due to intensity of illness or inability to take PO, and Inpatient level of care appropriate due to severity of illness  Dispo: The patient is from: Home              Anticipated d/c is to: Home              Patient currently is not medically stable to d/c.   Difficult to place patient No         Consultants:  Nephrology Interventional radiology  Procedures:  7/25 tunneled dialysis catheter placement by interventional radiology  Antimicrobials:      Subjective: He says he is feeling well.  He had his first dialysis treatment today and says he felt little tired afterwards.  He had a bowel movement earlier today that is described as brown with no blood.  Objective: Vitals:   09/05/20 0930 09/05/20 0951 09/05/20 1016 09/05/20 1642  BP: (!) 141/67 (!) 145/72 (!) 147/71 (!) 150/78  Pulse: 65 66 70   Resp:  (!) $Re'22 20 19  'fNB$ Temp:  97.9 F (36.6 C) 97.7 F (36.5 C) 99.8 F (37.7 C)  TempSrc:  Oral Oral Oral  SpO2:  100% 100% 99%  Weight:  134.5 kg    Height:        Intake/Output Summary (Last 24 hours) at 09/05/2020 1832 Last data filed at 09/05/2020 1030 Gross per 24 hour  Intake 0 ml  Output 1800 ml  Net -1800 ml   Filed Weights   09/05/20 0427 09/05/20 0740 09/05/20 0951  Weight: 135.5 kg 135.5 kg 134.5 kg    Examination:  General exam: Alert, awake, oriented x 3 Respiratory system: Clear to auscultation. Respiratory effort normal. Cardiovascular system:RRR. No murmurs, rubs, gallops. Gastrointestinal system: Abdomen is nondistended, soft and nontender. No organomegaly or masses felt. Normal bowel sounds heard. Central nervous system: Alert and oriented. No focal neurological deficits. Extremities: Significant lower extremity edema with anasarca Skin: Bruising noted in left upper extremity around fistula site Psychiatry: Judgement and insight appear normal. Mood & affect  appropriate.       Data Reviewed: I have personally reviewed following labs and imaging studies  CBC: Recent Labs  Lab 09/01/20 2140 09/02/20 0012 09/02/20 0500 09/03/20 0116 09/03/20 1447 09/04/20 0127 09/05/20 0133  WBC 8.5 7.8 7.0 7.4  --  7.8 9.0  NEUTROABS 6.7  --   --   --   --   --   --   HGB 8.1* 7.4* 7.4* 6.7* 7.9* 7.3* 7.7*  HCT 27.6* 25.1* 26.0* 22.9* 26.3* 25.1* 26.9*  MCV 79.1* 79.4* 80.2 78.4*  --  79.7* 81.3  PLT 252 229 254 209  --  228 568   Basic Metabolic Panel: Recent Labs  Lab 09/01/20 1819 09/02/20 0012 09/02/20 0500 09/03/20 0116 09/04/20 0127 09/05/20 0133  NA 132*  --  131* 131* 130* 131*  K 3.8  --  3.7 3.6 3.6 3.9  CL 97*  --  98 99 99 97*  CO2 20*  --  21* 18* 18* 20*  GLUCOSE 245*  --  185* 170* 184* 203*  BUN 123*  --  120* 122* 128* 127*  CREATININE 6.16* 6.27* 6.15* 6.10* 6.44* 6.61*  CALCIUM 7.8*  --  7.9* 7.8* 8.0* 8.0*  PHOS  --   --   --  8.3* 8.4* 8.4*   GFR: Estimated Creatinine Clearance: 15.4 mL/min (A) (by C-G formula based on SCr of 6.61 mg/dL (H)). Liver Function Tests: Recent Labs  Lab 09/01/20 1819 09/03/20 0116 09/04/20 0127 09/05/20 0133  AST 17  --   --   --   ALT 15  --   --   --   ALKPHOS 55  --   --   --   BILITOT 0.8  --   --   --   PROT 6.0*  --   --   --   ALBUMIN 2.5* 2.3* 2.3* 2.4*   No results for input(s): LIPASE, AMYLASE in the last 168 hours. No results for input(s): AMMONIA in the last 168 hours. Coagulation Profile: No results for input(s): INR, PROTIME in the last 168 hours. Cardiac Enzymes: No results for input(s): CKTOTAL, CKMB, CKMBINDEX, TROPONINI in the last 168 hours. BNP (last 3 results) No results for input(s): PROBNP in the last 8760 hours. HbA1C: No results for input(s): HGBA1C in the last 72 hours. CBG: Recent Labs  Lab 09/04/20 1215 09/04/20 1638 09/04/20 2116 09/05/20 0635 09/05/20 1614  GLUCAP 127* 151* 182* 161* 186*   Lipid Profile: No results for input(s):  CHOL, HDL, LDLCALC, TRIG, CHOLHDL, LDLDIRECT in the last 72 hours. Thyroid Function Tests: No results for input(s): TSH, T4TOTAL, FREET4, T3FREE, THYROIDAB in the last 72 hours. Anemia Panel: No results for input(s): VITAMINB12, FOLATE, FERRITIN, TIBC, IRON, RETICCTPCT in the last 72 hours. Sepsis Labs: No results for input(s): PROCALCITON, LATICACIDVEN in the last 168 hours.  Recent Results (from the past 240 hour(s))  Resp Panel by RT-PCR (Flu A&B, Covid) Nasopharyngeal Swab     Status: None   Collection Time: 09/01/20  9:06 PM   Specimen: Nasopharyngeal Swab; Nasopharyngeal(NP) swabs in vial transport medium  Result Value Ref Range Status   SARS Coronavirus 2 by RT PCR NEGATIVE NEGATIVE Final    Comment: (NOTE) SARS-CoV-2 target nucleic acids are NOT DETECTED.  The SARS-CoV-2 RNA is generally detectable in upper respiratory specimens during the acute phase of infection. The lowest concentration of SARS-CoV-2 viral copies this assay can detect is 138 copies/mL. A negative result does not preclude SARS-Cov-2 infection and should not be used as the sole basis for treatment or other patient management decisions. A negative result may occur with  improper specimen collection/handling, submission of specimen other than nasopharyngeal swab, presence of viral mutation(s) within the areas targeted by this assay, and inadequate number of viral copies(<138 copies/mL). A negative result must be combined with clinical observations, patient history, and epidemiological information. The expected result is Negative.  Fact Sheet for Patients:  EntrepreneurPulse.com.au  Fact Sheet for Healthcare Providers:  IncredibleEmployment.be  This test is no t yet approved or cleared by the [place] FDA and  has been authorized for detection and/or diagnosis of SARS-CoV-2 by FDA under an Emergency Use Authorization (EUA). This EUA will remain  in effect (meaning  this test can be used) for the duration of the COVID-19 declaration under Section 564(b)(1) of the Act, 21 U.S.C.section 360bbb-3(b)(1), unless the authorization is terminated  or revoked sooner.       Influenza A by PCR NEGATIVE NEGATIVE Final   Influenza B by PCR NEGATIVE NEGATIVE Final    Comment: (NOTE) The Xpert Xpress SARS-CoV-2/FLU/RSV plus assay is intended as an aid in the diagnosis of influenza from Nasopharyngeal swab specimens and should not be used as a sole basis for treatment. Nasal washings and aspirates are  unacceptable for Xpert Xpress SARS-CoV-2/FLU/RSV testing.  Fact Sheet for Patients: EntrepreneurPulse.com.au  Fact Sheet for Healthcare Providers: IncredibleEmployment.be  This test is not yet approved or cleared by the [place] FDA and has been authorized for detection and/or diagnosis of SARS-CoV-2 by FDA under an Emergency Use Authorization (EUA). This EUA will remain in effect (meaning this test can be used) for the duration of the COVID-19 declaration under Section 564(b)(1) of the Act, 21 U.S.C. section 360bbb-3(b)(1), unless the authorization is terminated or revoked.  Performed at [facility], [place] [address], [place], [place] [address]          Radiology Studies: IR Fluoro Guide CV Line Right  Result Date: 09/04/2020 INDICATION: 70 year old male with end-stage renal disease and malfunctioning left upper extremity fistula. EXAM: TUNNELED CENTRAL VENOUS HEMODIALYSIS CATHETER PLACEMENT WITH ULTRASOUND AND FLUOROSCOPIC GUIDANCE MEDICATIONS: Ancef 2 gm IV . The antibiotic was given in an appropriate time interval prior to skin puncture. ANESTHESIA/SEDATION: Moderate (conscious) sedation was employed during this procedure. A total of Versed 1 mg and Fentanyl 25 mcg was administered intravenously. Moderate Sedation Time: 15 minutes. The patient's level of consciousness and vital signs were monitored  continuously by radiology nursing throughout the procedure under my direct supervision. FLUOROSCOPY TIME:  0 minutes 12 seconds (7 mGy). COMPLICATIONS: None immediate. PROCEDURE: Informed written consent was obtained from the patient after a discussion of the risks, benefits, and alternatives to treatment. Questions regarding the procedure were encouraged and answered. The right neck and chest were prepped with chlorhexidine in a sterile fashion, and a sterile drape was applied covering the operative field. Maximum barrier sterile technique with sterile gowns and gloves were used for the procedure. A timeout was performed prior to the initiation of the procedure. After creating a small venotomy incision, a 21 gauge micropuncture kit was utilized to access the internal jugular vein. Real-time ultrasound guidance was utilized for vascular access including the acquisition of a permanent ultrasound image documenting patency of the accessed vessel. A Rosen wire was advanced to the level of the IVC and the micropuncture sheath was exchanged for an 8 Fr dilator. A 14.5 French tunneled hemodialysis catheter measuring 23 cm from tip to cuff was tunneled in a retrograde fashion from the anterior chest wall to the venotomy incision. Serial dilation was then performed an a peel-away sheath was placed. The catheter was then placed through the peel-away sheath with the catheter tip ultimately positioned within the right atrium. Final catheter positioning was confirmed and documented with a spot radiographic image. The catheter aspirates and flushes normally. The catheter was flushed with appropriate volume heparin dwells. The catheter exit site was secured with a 0-Silk retention suture. The venotomy incision was closed with Dermabond. Sterile dressings were applied. The patient tolerated the procedure well without immediate post procedural complication. IMPRESSION: Successful placement of 23 cm tip to cuff tunneled hemodialysis  catheter via the right internal jugular vein with catheter tip terminating within the right atrium. The catheter is ready for immediate use. Ruthann Cancer, MD [facility] Radiology Electronically Signed   By: Ruthann Cancer MD   On: 09/04/2020 15:01   IR US Guide Vasc Access Right  Result Date: 09/04/2020 INDICATION: 70 year old male with end-stage renal disease and malfunctioning left upper extremity fistula. EXAM: TUNNELED CENTRAL VENOUS HEMODIALYSIS CATHETER PLACEMENT WITH ULTRASOUND AND FLUOROSCOPIC GUIDANCE MEDICATIONS: Ancef 2 gm IV . The antibiotic was given in an appropriate time interval prior to skin puncture. ANESTHESIA/SEDATION: Moderate (  conscious) sedation was employed during this procedure. A total of Versed 1 mg and Fentanyl 25 mcg was administered intravenously. Moderate Sedation Time: 15 minutes. The patient's level of consciousness and vital signs were monitored continuously by radiology nursing throughout the procedure under my direct supervision. FLUOROSCOPY TIME:  0 minutes 12 seconds (7 mGy). COMPLICATIONS: None immediate. PROCEDURE: Informed written consent was obtained from the patient after a discussion of the risks, benefits, and alternatives to treatment. Questions regarding the procedure were encouraged and answered. The right neck and chest were prepped with chlorhexidine in a sterile fashion, and a sterile drape was applied covering the operative field. Maximum barrier sterile technique with sterile gowns and gloves were used for the procedure. A timeout was performed prior to the initiation of the procedure. After creating a small venotomy incision, a 21 gauge micropuncture kit was utilized to access the internal jugular vein. Real-time ultrasound guidance was utilized for vascular access including the acquisition of a permanent ultrasound image documenting patency of the accessed vessel. A Rosen wire was advanced to the level of the  IVC and the micropuncture sheath was exchanged for an 8 Fr dilator. A 14.5 French tunneled hemodialysis catheter measuring 23 cm from tip to cuff was tunneled in a retrograde fashion from the anterior chest wall to the venotomy incision. Serial dilation was then performed an a peel-away sheath was placed. The catheter was then placed through the peel-away sheath with the catheter tip ultimately positioned within the right atrium. Final catheter positioning was confirmed and documented with a spot radiographic image. The catheter aspirates and flushes normally. The catheter was flushed with appropriate volume heparin dwells. The catheter exit site was secured with a 0-Silk retention suture. The venotomy incision was closed with Dermabond. Sterile dressings were applied. The patient tolerated the procedure well without immediate post procedural complication. IMPRESSION: Successful placement of 23 cm tip to cuff tunneled hemodialysis catheter via the right internal jugular vein with catheter tip terminating within the right atrium. The catheter is ready for immediate use. Ruthann Cancer, MD [facility] Radiology Electronically Signed   By: Ruthann Cancer MD   On: 09/04/2020 15:01        Scheduled Meds:  calcium acetate  1,334 mg Oral TID WC   carvedilol  6.25 mg Oral BID WC   Chlorhexidine Gluconate Cloth  6 each Topical Q0600   [START ON 09/11/2020] darbepoetin (ARANESP) injection - DIALYSIS  200 mcg Intravenous Q Mon-HD   heparin  5,000 Units Subcutaneous Q8H   Continuous Infusions:  furosemide 160 mg (09/05/20 1721)     LOS: 4 days    Time spent: 58mins    Kathie Dike, MD [facility]   If 7PM-7AM, please contact night-coverage [email or web address]  09/05/2020, 6:32 PM

## 2020-09-05 NOTE — Progress Notes (Signed)
Pt returned from dialysis, VSS, HD cath clean and dry,  call light within reach, will continue to monitor.   Chrisandra Carota, RN 09/05/2020 10:18 AM

## 2020-09-05 NOTE — Progress Notes (Signed)
Out Patient Arrangements:  Have been requested to arrange out pt HD for pt. Have submitted medical records to [facility] admissions. Will need his SS number to confirm his insurance. Have requested help from SW. Please advise of d/c date.   Linus Orn HPSS [phone]

## 2020-09-06 DIAGNOSIS — N189 Chronic kidney disease, unspecified: Secondary | ICD-10-CM | POA: Insufficient documentation

## 2020-09-06 DIAGNOSIS — N186 End stage renal disease: Secondary | ICD-10-CM | POA: Insufficient documentation

## 2020-09-06 DIAGNOSIS — R52 Pain, unspecified: Secondary | ICD-10-CM | POA: Insufficient documentation

## 2020-09-06 DIAGNOSIS — D689 Coagulation defect, unspecified: Secondary | ICD-10-CM | POA: Insufficient documentation

## 2020-09-06 DIAGNOSIS — R0600 Dyspnea, unspecified: Secondary | ICD-10-CM | POA: Insufficient documentation

## 2020-09-06 DIAGNOSIS — T782XXA Anaphylactic shock, unspecified, initial encounter: Secondary | ICD-10-CM | POA: Insufficient documentation

## 2020-09-06 DIAGNOSIS — N2581 Secondary hyperparathyroidism of renal origin: Secondary | ICD-10-CM | POA: Insufficient documentation

## 2020-09-06 DIAGNOSIS — T7840XA Allergy, unspecified, initial encounter: Secondary | ICD-10-CM | POA: Insufficient documentation

## 2020-09-06 DIAGNOSIS — Z23 Encounter for immunization: Secondary | ICD-10-CM | POA: Insufficient documentation

## 2020-09-06 DIAGNOSIS — D631 Anemia in chronic kidney disease: Secondary | ICD-10-CM | POA: Insufficient documentation

## 2020-09-06 DIAGNOSIS — L299 Pruritus, unspecified: Secondary | ICD-10-CM | POA: Insufficient documentation

## 2020-09-06 DIAGNOSIS — Z86711 Personal history of pulmonary embolism: Secondary | ICD-10-CM | POA: Insufficient documentation

## 2020-09-06 DIAGNOSIS — I252 Old myocardial infarction: Secondary | ICD-10-CM | POA: Insufficient documentation

## 2020-09-06 DIAGNOSIS — I5020 Unspecified systolic (congestive) heart failure: Secondary | ICD-10-CM | POA: Insufficient documentation

## 2020-09-06 DIAGNOSIS — R197 Diarrhea, unspecified: Secondary | ICD-10-CM | POA: Insufficient documentation

## 2020-09-06 DIAGNOSIS — D509 Iron deficiency anemia, unspecified: Secondary | ICD-10-CM | POA: Insufficient documentation

## 2020-09-06 LAB — RENAL FUNCTION PANEL
Albumin: 2.4 g/dL — ABNORMAL LOW (ref 3.5–5.0)
Anion gap: 13 (ref 5–15)
BUN: 110 mg/dL — ABNORMAL HIGH (ref 8–23)
CO2: 20 mmol/L — ABNORMAL LOW (ref 22–32)
Calcium: 8 mg/dL — ABNORMAL LOW (ref 8.9–10.3)
Chloride: 98 mmol/L (ref 98–111)
Creatinine, Ser: 6.03 mg/dL — ABNORMAL HIGH (ref 0.61–1.24)
GFR, Estimated: 9 mL/min — ABNORMAL LOW (ref >60)
Glucose, Bld: 199 mg/dL — ABNORMAL HIGH (ref 70–99)
Phosphorus: 6.7 mg/dL — ABNORMAL HIGH (ref 2.5–4.6)
Potassium: 4.2 mmol/L (ref 3.5–5.1)
Sodium: 131 mmol/L — ABNORMAL LOW (ref 135–145)

## 2020-09-06 LAB — CBC
HCT: 26.1 % — ABNORMAL LOW (ref 39.0–52.0)
Hemoglobin: 7.7 g/dL — ABNORMAL LOW (ref 13.0–17.0)
MCH: 23.5 pg — ABNORMAL LOW (ref 26.0–34.0)
MCHC: 29.5 g/dL — ABNORMAL LOW (ref 30.0–36.0)
MCV: 79.6 fL — ABNORMAL LOW (ref 80.0–100.0)
Platelets: 220 10*3/uL (ref 150–400)
RBC: 3.28 MIL/uL — ABNORMAL LOW (ref 4.22–5.81)
RDW: 18.5 % — ABNORMAL HIGH (ref 11.5–15.5)
WBC: 9.6 10*3/uL (ref 4.0–10.5)
nRBC: 0.2 % (ref 0.0–0.2)

## 2020-09-06 LAB — GLUCOSE, CAPILLARY
Glucose-Capillary: 162 mg/dL — ABNORMAL HIGH (ref 70–99)
Glucose-Capillary: 177 mg/dL — ABNORMAL HIGH (ref 70–99)
Glucose-Capillary: 118 mg/dL — ABNORMAL HIGH (ref 70–99)
Glucose-Capillary: 150 mg/dL — ABNORMAL HIGH (ref 70–99)

## 2020-09-06 MED ORDER — INSULIN GLARGINE-YFGN 100 UNIT/ML ~~LOC~~ SOLN
10.0000 [IU] | Freq: Every day | SUBCUTANEOUS | Status: DC
  Administered 2020-09-06 – 2020-09-08 (×3): 10 [IU] via SUBCUTANEOUS
  Filled 2020-09-06 (×3): qty 0.1

## 2020-09-06 MED ORDER — HEPARIN SODIUM (PORCINE) 1000 UNIT/ML IJ SOLN
INTRAMUSCULAR | Status: AC
  Filled 2020-09-06: qty 4

## 2020-09-06 MED ORDER — DARBEPOETIN ALFA 200 MCG/0.4ML IJ SOSY
PREFILLED_SYRINGE | INTRAMUSCULAR | Status: AC
  Administered 2020-09-06: 200 ug via SUBCUTANEOUS
  Filled 2020-09-06: qty 0.4

## 2020-09-06 NOTE — Progress Notes (Signed)
Out Patient Arrangements:  Pt has been accepted @ [facility] on their TTS shift w/ a time of 11:45 am. Please advise of d/c date  The address is  [address]  [place], [place] [address]  Phone: [phone]   Please advise of d/c date.  Linus Orn HPSS [phone]

## 2020-09-06 NOTE — Progress Notes (Signed)
IR was requested for image guided left AVF fistulogram.   Per note from nephrologist, Dr. Augustin Coupe, would wait 2 week (from 7/25) for infiltrate on left arm to go down before fistulogram.    Will delete the fistulogram order.  Please re-order fistulogram when it is appropriate.   Please call IR for questions and concerns.   Travis Blair H [name] PA-C 09/06/2020 7:02 AM

## 2020-09-06 NOTE — TOC Initial Note (Signed)
Transition of Care (TOC) - Initial/Assessment Note  Marvetta Gibbons RN, BSN Transitions of Care Unit 4E- RN Case Manager See Treatment Team for direct phone #    Patient Details  Name: Travis Blair MRN: [id number] Date of Birth: 03-16-1950  Transition of Care [facility]) CM/SW Contact:    Dawayne Patricia, RN Phone Number: 09/06/2020, 3:36 PM  Clinical Narrative:                 Stopped by to speak with wife regarding transition needs, (pt in HD)- wife requesting RW and 3n1 for home. Per conversation at the bedside wife states pt has a bariatric rollator at home, but feels like he would benefit from a lighter walker, as well as something he can sit on in the shower and have to use at night at the bedside. Noted PT has not seen pt on this admission- bedside RN to request PT eval to assess needs. Pt may benefit from [facility] therapy as wife states he is deconditioned.  Noted outpt HD has been set up and confirmed for TTS.   TOC will f/u after PT eval for recommendations. And DME needs.   Expected Discharge Plan: [place] Barriers to Discharge: Continued Medical Work up   Patient Goals and CMS Choice Patient states their goals for this hospitalization and ongoing recovery are:: return home CMS Medicare.gov Compare Post Acute Care list provided to:: Patient Choice offered to / list presented to : Patient, Spouse  Expected Discharge Plan and Services Expected Discharge Plan: [place]   Discharge Planning Services: CM Consult   Living arrangements for the past 2 months: Single Family Home                                      Prior Living Arrangements/Services Living arrangements for the past 2 months: Single Family Home Lives with:: Spouse Patient language and need for interpreter reviewed:: Yes        Need for Family Participation in Patient Care: Yes (Comment) Care giver support system in place?: Yes (comment) Current home  services: DME Criminal Activity/Legal Involvement Pertinent to Current Situation/Hospitalization: No - Comment as needed  Activities of Daily Living Home Assistive Devices/Equipment: Walker (specify type) ADL Screening (condition at time of admission) Patient's cognitive ability adequate to safely complete daily activities?: Yes Is the patient deaf or have difficulty hearing?: No Does the patient have difficulty seeing, even when wearing glasses/contacts?: No Does the patient have difficulty concentrating, remembering, or making decisions?: No Patient able to express need for assistance with ADLs?: Yes Does the patient have difficulty dressing or bathing?: No Independently performs ADLs?: Yes (appropriate for developmental age) Does the patient have difficulty walking or climbing stairs?: No Weakness of Legs: None Weakness of Arms/Hands: None  Permission Sought/Granted                  Emotional Assessment       Orientation: : Oriented to Self, Oriented to Place, Oriented to  Time, Oriented to Situation Alcohol / Substance Use: Not Applicable Psych Involvement: No (comment)  Admission diagnosis:  Acute CHF (congestive heart failure) (HCC) [I50.9] AKI (acute kidney injury) ([place]) [N17.9] Acute on chronic diastolic CHF (congestive heart failure) ([place]) [I50.33] Chronic kidney disease, unspecified CKD stage [N18.9] Acute on chronic congestive heart failure, unspecified heart failure type [facility]) [I50.9] Patient Active Problem List  Diagnosis Date Noted   Acute on chronic congestive heart failure ([place]) 09/01/2020   CKD (chronic kidney disease) stage 5, GFR less than 15 ml/min (HCC) 09/01/2020   Acute on chronic diastolic CHF (congestive heart failure) ([place]) 09/01/2020   Hypertensive retinopathy of both eyes 02/03/2020   Sleep apnea 01/20/2020   Severe nonproliferative diabetic retinopathy of right eye, with macular edema, associated with type 2 diabetes mellitus ([place]) 01/20/2020    Severe nonproliferative diabetic retinopathy of left eye, with macular edema, associated with type 2 diabetes mellitus ([place]) 01/20/2020   Vitreomacular adhesion of left eye 01/20/2020   Vitreomacular adhesion of right eye 01/20/2020   Statin myopathy 12/06/2019   Hammer toe of right foot 12/25/2018   Deformity of metatarsal bone of right foot 12/25/2018   Coronary artery disease involving native coronary artery of native heart without angina pectoris 07/27/2018   Essential hypertension 07/27/2018   S/P CABG x 3 03/07/2017   Abnormal stress test 12/18/2016   Right atrial dilatation 11/17/2016   Right ventricular dilation 11/17/2016   Near syncope 11/16/2016   Uncontrolled type 2 diabetes mellitus with hyperglycemia, without long-term current use of insulin ([place]) 11/16/2016   Type 2 diabetes mellitus with hyperlipidemia ([place]) 11/16/2016   Hyperlipidemia, unspecified 11/16/2016   Obesity 11/16/2016   Hyponatremia 11/16/2016   AKI (acute kidney injury) ([place]) 11/16/2016   PCP:  [name], MD Pharmacy:   [facility], [place] [address] Phone: [phone] Fax: [phone]     Social Determinants of Health (SDOH) Interventions    Readmission Risk Interventions No flowsheet data found.

## 2020-09-06 NOTE — Consult Note (Addendum)
[place] Nurse Consult Note: Reason for Consult: Right foot wound. Recently healed wound on left heel. Patient is followed by Dr. March Rummage (Podiatric Medicine) at the [facility]. Wound type: Neuropathic Pressure Injury POA: N/A Measurement: Right foot, plantar aspect, 5th metatarsal head:  2cm x 2.6cm x 0.3cm deep red wound bed and periwound callus. Small serous exudate.  RLE with 3+ edema and weeping.  Left medial hindfoot with 1cm x 0.5cm area of dried serum (scabbing). No drainage.  Left LE with 3+ edema and weeping. Linear areas noted where Unna's boot had injured epidermis at medial LE and posterior calf. Wound bed:As described above Drainage (amount, consistency, odor) As described above Periwound: Dry, intact Dressing procedure/placement/frequency: Patient reports that legs were wrapped (multilayer, Unna's boot with Coban per their notes) at the Podiatrists office, but that his edema was causing the wrap to dig into the skin. We will use a "dry boot" here (topical care to RLE wound, then wrap from just below toes to just below knees with Kerlix roll gauze followed by wrapping in a similar manner with ACE bandages).  We will perform this twice daily to ensure that the patient does not sustain a medical device related pressure injury from the ACE bandages. Bedside RN and I discuss that the appropriate tension on the ACE would be for two fingers to slip beneath a wrap easily. Prevalon boots are provided for floatation of the heel while in bed.  A pressure redistribution chair cushion is provided for use in the chair. A prophylactic foam dressing is added for PI prevention. Patient states that he had HD yesterday and that he will have again today and tomorrow.  Thank you for allowing [place] to consult on this nice gentleman. [place] nursing team will not follow, but will remain available to this patient, the nursing and medical teams.  Please re-consult if needed.   Maudie Flakes, MSN, RN, [place], [facility]  Pager# [phone]

## 2020-09-06 NOTE — Progress Notes (Signed)
PROGRESS NOTE    Travis Blair  KWI:[id number] DOB: Feb 03, 1951 DOA: 09/01/2020 PCP: [name], MD    Brief Narrative:  70 year old male with a history of chronic kidney disease stage V, diastolic heart failure, admitted to the hospital with worsening lower extremity edema and shortness of breath.  He was noted to have worsening renal failure.  He was seen by nephrology with recommendations to initiate dialysis to help manage his volume status.  Assessment & Plan:   Principal Problem:   Acute on chronic congestive heart failure (HCC) Active Problems:   S/P CABG x 3   Essential hypertension   CKD (chronic kidney disease) stage 5, GFR less than 15 ml/min (HCC)   Acute on chronic diastolic CHF (congestive heart failure) (HCC)   Acute on chronic kidney disease stage V/A1 CKD: -Patient presents with volume overload, elevated creatinine -He already has an AV fistula placed in preparation for dialysis -CLIP in process to arrange outpatient dialysis in [place] -Continued on diuretics since he is still making urine -First HD session attempted on 7/24, but unfortunately was not successful with aVF -Patient had tunneled dialysis catheter placed by interventional radiology  -First HD session done on 7/26, he is scheduled for the second session today. Per nephrology and IR, they would like to wait 2 weeks to do fistulogram.  Acute on chronic diastolic congestive heart failure with pulmonary hypertension -Volume removal to be accomplished through dialysis, no dyspnea or hypoxia but he has severe edema, +3 pitting edema bilateral lower extremity and +3 left upper extremity and +2 right upper extremity.  Essential hypertension -Noted to have Coreg on his PTA med list -Patient reports that this was discontinued by his primary care physician due to concerns for bradycardia -Heart rate currently running in the 60s -We will hold further Coreg -Blood pressure is elevated at times,  but hopefully this will improve with continued volume removal -Use as needed hydralazine for now -May need addition of further antihypertensives prior to discharge, depending on trend of blood pressures.  Will monitor over the course of next 24 hours.  Anemia -Suspect a large component from underlying renal disease -Started on iron infusion as well as ESA -Recently, he was noted to have heme positive stools by his PCP, he did not have any evidence of gross GI bleeding -He was to undergo outpatient GI work-up, but was unable to safely perform the studies due to his volume overload and inability to lay flat on the table -Further GI evaluation has been rescheduled for next month -Hemoglobin down to nadir of 6.7 during this admission for which he received 1 unit of PRBC on 7/24, follow-up hemoglobin has improved and remains a stable around 7.5 range. -Continue to monitor hemoglobin and transfuse for hemoglobin less than 7  Recent history of right lower lobe PE and left lower extremity DVT diagnosed 12/2019 -Patient was taking Eliquis up until 06/2020 -This was discontinued when he was noted to have heme positive stools -Venous Dopplers of lower extremity rechecked and did not show any residual DVT -Suspect VTE has been adequately treated with 6 months of anticoagulation  Atrial fibrillation -Heart rate is currently stable, off Coreg. -He was taken off anticoagulation by his primary care physician due to having heme positive stools -Outpatient GI evaluation has been scheduled  Obstructive sleep apnea -Continue on CPAP  Coronary artery disease status post CABG -No chest pain at this time  Type 2 diabetes -Patient was having hypoglycemic episodes prior to admission -Currently  is not on any maintenance medications.  Now hyperglycemic.  Will start on Lantus 10 units and continue SSI.  DVT prophylaxis: heparin injection 5,000 Units Start: 09/01/20 2330  Code Status: Full code Family  Communication: Discussed with patient, no family present at Disposition Plan: Status is: Inpatient  Remains inpatient appropriate because:Ongoing diagnostic testing needed not appropriate for outpatient work up, IV treatments appropriate due to intensity of illness or inability to take PO, and Inpatient level of care appropriate due to severity of illness  Dispo: The patient is from: Home              Anticipated d/c is to: Home              Patient currently is not medically stable to d/c.   Difficult to place patient No         Consultants:  Nephrology Interventional radiology  Procedures:  7/25 tunneled dialysis catheter placement by interventional radiology  Antimicrobials:      Subjective: Seen and examined.  He has no complaints.  He is fully aware of the plan of dialysis.  Objective: Vitals:   09/05/20 2315 09/06/20 0420 09/06/20 0717 09/06/20 1101  BP: 140/65 (!) 167/79 (!) 152/68 (!) 151/66  Pulse: 75 69 74 71  Resp: $Remo'18 20 18 20  'BoKOX$ Temp: 99 F (37.2 C) 98.1 F (36.7 C) 97.7 F (36.5 C) 98 F (36.7 C)  TempSrc: Oral Oral Oral Oral  SpO2: 100% 99% 98% 98%  Weight:  134.8 kg    Height:        Intake/Output Summary (Last 24 hours) at 09/06/2020 1131 Last data filed at 09/06/2020 0615 Gross per 24 hour  Intake 356 ml  Output 860 ml  Net -504 ml    Filed Weights   09/05/20 0740 09/05/20 0951 09/06/20 0420  Weight: 135.5 kg 134.5 kg 134.8 kg    Examination:  General exam: Appears calm and comfortable  Respiratory system: Clear to auscultation. Respiratory effort normal. Cardiovascular system: S1 & S2 heard, RRR. No JVD, murmurs, rubs, gallops or clicks.  +3 pitting edema bilateral lower extremity, +3 pitting edema left upper extremity and +2 pitting edema right upper extremity. Gastrointestinal system: Abdomen is nondistended, soft and nontender. No organomegaly or masses felt. Normal bowel sounds heard. Central nervous system: Alert and oriented. No  focal neurological deficits. Extremities: Symmetric 5 x 5 power. Skin: No rashes, lesions or ulcers.  Psychiatry: Judgement and insight appear normal. Mood & affect appropriate.    Data Reviewed: I have personally reviewed following labs and imaging studies  CBC: Recent Labs  Lab 09/01/20 2140 09/02/20 0012 09/02/20 0500 09/03/20 0116 09/03/20 1447 09/04/20 0127 09/05/20 0133 09/06/20 0105  WBC 8.5   < > 7.0 7.4  --  7.8 9.0 9.6  NEUTROABS 6.7  --   --   --   --   --   --   --   HGB 8.1*   < > 7.4* 6.7* 7.9* 7.3* 7.7* 7.7*  HCT 27.6*   < > 26.0* 22.9* 26.3* 25.1* 26.9* 26.1*  MCV 79.1*   < > 80.2 78.4*  --  79.7* 81.3 79.6*  PLT 252   < > 254 209  --  228 229 220   < > = values in this interval not displayed.    Basic Metabolic Panel: Recent Labs  Lab 09/02/20 0500 09/03/20 0116 09/04/20 0127 09/05/20 0133 09/06/20 0105  NA 131* 131* 130* 131* 131*  K 3.7 3.6 3.6  3.9 4.2  CL 98 99 99 97* 98  CO2 21* 18* 18* 20* 20*  GLUCOSE 185* 170* 184* 203* 199*  BUN 120* 122* 128* 127* 110*  CREATININE 6.15* 6.10* 6.44* 6.61* 6.03*  CALCIUM 7.9* 7.8* 8.0* 8.0* 8.0*  PHOS  --  8.3* 8.4* 8.4* 6.7*    GFR: Estimated Creatinine Clearance: 16.9 mL/min (A) (by C-G formula based on SCr of 6.03 mg/dL (H)). Liver Function Tests: Recent Labs  Lab 09/01/20 1819 09/03/20 0116 09/04/20 0127 09/05/20 0133 09/06/20 0105  AST 17  --   --   --   --   ALT 15  --   --   --   --   ALKPHOS 55  --   --   --   --   BILITOT 0.8  --   --   --   --   PROT 6.0*  --   --   --   --   ALBUMIN 2.5* 2.3* 2.3* 2.4* 2.4*    No results for input(s): LIPASE, AMYLASE in the last 168 hours. No results for input(s): AMMONIA in the last 168 hours. Coagulation Profile: No results for input(s): INR, PROTIME in the last 168 hours. Cardiac Enzymes: No results for input(s): CKTOTAL, CKMB, CKMBINDEX, TROPONINI in the last 168 hours. BNP (last 3 results) No results for input(s): PROBNP in the last 8760  hours. HbA1C: No results for input(s): HGBA1C in the last 72 hours. CBG: Recent Labs  Lab 09/05/20 0635 09/05/20 1614 09/05/20 2108 09/06/20 0618 09/06/20 1103  GLUCAP 161* 186* 237* 162* 177*    Lipid Profile: No results for input(s): CHOL, HDL, LDLCALC, TRIG, CHOLHDL, LDLDIRECT in the last 72 hours. Thyroid Function Tests: No results for input(s): TSH, T4TOTAL, FREET4, T3FREE, THYROIDAB in the last 72 hours. Anemia Panel: No results for input(s): VITAMINB12, FOLATE, FERRITIN, TIBC, IRON, RETICCTPCT in the last 72 hours. Sepsis Labs: No results for input(s): PROCALCITON, LATICACIDVEN in the last 168 hours.  Recent Results (from the past 240 hour(s))  Resp Panel by RT-PCR (Flu A&B, Covid) Nasopharyngeal Swab     Status: None   Collection Time: 09/01/20  9:06 PM   Specimen: Nasopharyngeal Swab; Nasopharyngeal(NP) swabs in vial transport medium  Result Value Ref Range Status   SARS Coronavirus 2 by RT PCR NEGATIVE NEGATIVE Final    Comment: (NOTE) SARS-CoV-2 target nucleic acids are NOT DETECTED.  The SARS-CoV-2 RNA is generally detectable in upper respiratory specimens during the acute phase of infection. The lowest concentration of SARS-CoV-2 viral copies this assay can detect is 138 copies/mL. A negative result does not preclude SARS-Cov-2 infection and should not be used as the sole basis for treatment or other patient management decisions. A negative result may occur with  improper specimen collection/handling, submission of specimen other than nasopharyngeal swab, presence of viral mutation(s) within the areas targeted by this assay, and inadequate number of viral copies(<138 copies/mL). A negative result must be combined with clinical observations, patient history, and epidemiological information. The expected result is Negative.  Fact Sheet for Patients:  EntrepreneurPulse.com.au  Fact Sheet for Healthcare Providers:   IncredibleEmployment.be  This test is no t yet approved or cleared by the [place] FDA and  has been authorized for detection and/or diagnosis of SARS-CoV-2 by FDA under an Emergency Use Authorization (EUA). This EUA will remain  in effect (meaning this test can be used) for the duration of the COVID-19 declaration under Section 564(b)(1) of the Act, 21 U.S.C.section 360bbb-3(b)(1), unless  the authorization is terminated  or revoked sooner.       Influenza A by PCR NEGATIVE NEGATIVE Final   Influenza B by PCR NEGATIVE NEGATIVE Final    Comment: (NOTE) The Xpert Xpress SARS-CoV-2/FLU/RSV plus assay is intended as an aid in the diagnosis of influenza from Nasopharyngeal swab specimens and should not be used as a sole basis for treatment. Nasal washings and aspirates are unacceptable for Xpert Xpress SARS-CoV-2/FLU/RSV testing.  Fact Sheet for Patients: EntrepreneurPulse.com.au  Fact Sheet for Healthcare Providers: IncredibleEmployment.be  This test is not yet approved or cleared by the [place] FDA and has been authorized for detection and/or diagnosis of SARS-CoV-2 by FDA under an Emergency Use Authorization (EUA). This EUA will remain in effect (meaning this test can be used) for the duration of the COVID-19 declaration under Section 564(b)(1) of the Act, 21 U.S.C. section 360bbb-3(b)(1), unless the authorization is terminated or revoked.  Performed at [facility], [place] [address], [place], [place] [address]           Radiology Studies: IR Fluoro Guide CV Line Right  Result Date: 09/04/2020 INDICATION: 70 year old male with end-stage renal disease and malfunctioning left upper extremity fistula. EXAM: TUNNELED CENTRAL VENOUS HEMODIALYSIS CATHETER PLACEMENT WITH ULTRASOUND AND FLUOROSCOPIC GUIDANCE MEDICATIONS: Ancef 2 gm IV . The antibiotic was given in an appropriate time interval prior to skin  puncture. ANESTHESIA/SEDATION: Moderate (conscious) sedation was employed during this procedure. A total of Versed 1 mg and Fentanyl 25 mcg was administered intravenously. Moderate Sedation Time: 15 minutes. The patient's level of consciousness and vital signs were monitored continuously by radiology nursing throughout the procedure under my direct supervision. FLUOROSCOPY TIME:  0 minutes 12 seconds (7 mGy). COMPLICATIONS: None immediate. PROCEDURE: Informed written consent was obtained from the patient after a discussion of the risks, benefits, and alternatives to treatment. Questions regarding the procedure were encouraged and answered. The right neck and chest were prepped with chlorhexidine in a sterile fashion, and a sterile drape was applied covering the operative field. Maximum barrier sterile technique with sterile gowns and gloves were used for the procedure. A timeout was performed prior to the initiation of the procedure. After creating a small venotomy incision, a 21 gauge micropuncture kit was utilized to access the internal jugular vein. Real-time ultrasound guidance was utilized for vascular access including the acquisition of a permanent ultrasound image documenting patency of the accessed vessel. A Rosen wire was advanced to the level of the IVC and the micropuncture sheath was exchanged for an 8 Fr dilator. A 14.5 French tunneled hemodialysis catheter measuring 23 cm from tip to cuff was tunneled in a retrograde fashion from the anterior chest wall to the venotomy incision. Serial dilation was then performed an a peel-away sheath was placed. The catheter was then placed through the peel-away sheath with the catheter tip ultimately positioned within the right atrium. Final catheter positioning was confirmed and documented with a spot radiographic image. The catheter aspirates and flushes normally. The catheter was flushed with appropriate volume heparin dwells. The catheter exit site was secured  with a 0-Silk retention suture. The venotomy incision was closed with Dermabond. Sterile dressings were applied. The patient tolerated the procedure well without immediate post procedural complication. IMPRESSION: Successful placement of 23 cm tip to cuff tunneled hemodialysis catheter via the right internal jugular vein with catheter tip terminating within the right atrium. The catheter is ready for immediate use. Ruthann Cancer, MD [facility]  Electronically Signed   By: Ruthann Cancer MD   On: 09/04/2020 15:01   IR US Guide Vasc Access Right  Result Date: 09/04/2020 INDICATION: 70 year old male with end-stage renal disease and malfunctioning left upper extremity fistula. EXAM: TUNNELED CENTRAL VENOUS HEMODIALYSIS CATHETER PLACEMENT WITH ULTRASOUND AND FLUOROSCOPIC GUIDANCE MEDICATIONS: Ancef 2 gm IV . The antibiotic was given in an appropriate time interval prior to skin puncture. ANESTHESIA/SEDATION: Moderate (conscious) sedation was employed during this procedure. A total of Versed 1 mg and Fentanyl 25 mcg was administered intravenously. Moderate Sedation Time: 15 minutes. The patient's level of consciousness and vital signs were monitored continuously by radiology nursing throughout the procedure under my direct supervision. FLUOROSCOPY TIME:  0 minutes 12 seconds (7 mGy). COMPLICATIONS: None immediate. PROCEDURE: Informed written consent was obtained from the patient after a discussion of the risks, benefits, and alternatives to treatment. Questions regarding the procedure were encouraged and answered. The right neck and chest were prepped with chlorhexidine in a sterile fashion, and a sterile drape was applied covering the operative field. Maximum barrier sterile technique with sterile gowns and gloves were used for the procedure. A timeout was performed prior to the initiation of the procedure. After creating a small venotomy incision, a 21 gauge  micropuncture kit was utilized to access the internal jugular vein. Real-time ultrasound guidance was utilized for vascular access including the acquisition of a permanent ultrasound image documenting patency of the accessed vessel. A Rosen wire was advanced to the level of the IVC and the micropuncture sheath was exchanged for an 8 Fr dilator. A 14.5 French tunneled hemodialysis catheter measuring 23 cm from tip to cuff was tunneled in a retrograde fashion from the anterior chest wall to the venotomy incision. Serial dilation was then performed an a peel-away sheath was placed. The catheter was then placed through the peel-away sheath with the catheter tip ultimately positioned within the right atrium. Final catheter positioning was confirmed and documented with a spot radiographic image. The catheter aspirates and flushes normally. The catheter was flushed with appropriate volume heparin dwells. The catheter exit site was secured with a 0-Silk retention suture. The venotomy incision was closed with Dermabond. Sterile dressings were applied. The patient tolerated the procedure well without immediate post procedural complication. IMPRESSION: Successful placement of 23 cm tip to cuff tunneled hemodialysis catheter via the right internal jugular vein with catheter tip terminating within the right atrium. The catheter is ready for immediate use. Ruthann Cancer, MD [facility] Radiology Electronically Signed   By: Ruthann Cancer MD   On: 09/04/2020 15:01        Scheduled Meds:  calcium acetate  1,334 mg Oral TID WC   Chlorhexidine Gluconate Cloth  6 each Topical Q0600   [START ON 09/11/2020] darbepoetin (ARANESP) injection - DIALYSIS  200 mcg Intravenous Q Mon-HD   heparin  5,000 Units Subcutaneous Q8H   insulin glargine-yfgn  10 Units Subcutaneous Daily   pantoprazole  40 mg Oral Daily   Continuous Infusions:  furosemide 160 mg (09/06/20 0917)     LOS: 5  days    Time spent: 35 mins  Darliss Cheney, MD [facility]   If 7PM-7AM, please contact night-coverage [email or web address]  09/06/2020, 11:31 AM

## 2020-09-06 NOTE — Progress Notes (Signed)
[facility] Progress Note   70 year old WM with cardiac issues as well as advanced CKD-  presents with volume overload, anemia and uremia  Assessment/ Plan:   1.Renal- advanced CKD at baseline-  have been prepping for dialysis as OP.  Now with volume overload refractory to meds, and uremia.  Agrees to start dialysis.  Unfortunately  first treatment was not successful Saturday  with AVF.  Will need to consult IR for   [facility] placement; I would hold off on a fistulogram tiill the infiltration settles down in 2 weeks (warm compress).  - Appreciate VIR  placing the RIJ TC on 7/25. - Lives in [place] so can CLIP to [facility] OP unit-  also may be able to eventually do home HD. I notified Pam CLIP on 7/25.  Plan on  HD today and then daily through Fri and hopefuly he can get HD at [facility] Saturday given the massive volume overload.  [employer] boots should help to mobilize the fluid.  2. Hypertension/volume  - massive volume overload.  On iv lasix for now, making some urine so will continue and increase to max dose -  will do UF with HD also for volume once can establish   3. Anemia  - significant and not helping his DOE and weakness-  will load with iron and give ESA-  transfusion on 7/23  4. Bones-  check PTH- pending  phos up (8.4) -  just started phoslo 2 tabs TIDM. Will recheck phos on Thur.  Subjective:   Unable to use AVF for treatment Saturday and e/o infiltration. He denies dyspnea or nausea  and is tolerated HD yest.     Objective:   BP (!) 151/66 (BP Location: Right Wrist)   Pulse 71   Temp 98 F (36.7 C) (Oral)   Resp 20   Ht _0  (1.88 m)   Wt 134.8 kg Comment: scale A  SpO2 98%   BMI 38.16 kg/m   Intake/Output Summary (Last 24 hours) at 09/06/2020 1237 Last data filed at 09/06/2020 0615 Gross per 24 hour  Intake 356 ml  Output 860 ml  Net -504 ml   Weight change: 0.01 kg  Physical Exam: General: sitting up in bedside chair-  pale, fatigued Heart:  RRR Lungs: mostly clear Abdomen: obese, soft, non tender Extremities: pitting edema and LE weeping  Dialysis Access: left AVF -  much bruising-  good thrill and bruit  , RIJ TC  Imaging: IR Fluoro Guide CV Line Right  Result Date: 09/04/2020 INDICATION: 70 year old male with end-stage renal disease and malfunctioning left upper extremity fistula. EXAM: TUNNELED CENTRAL VENOUS HEMODIALYSIS CATHETER PLACEMENT WITH ULTRASOUND AND FLUOROSCOPIC GUIDANCE MEDICATIONS: Ancef 2 gm IV . The antibiotic was given in an appropriate time interval prior to skin puncture. ANESTHESIA/SEDATION: Moderate (conscious) sedation was employed during this procedure. A total of Versed 1 mg and Fentanyl 25 mcg was administered intravenously. Moderate Sedation Time: 15 minutes. The patient's level of consciousness and vital signs were monitored continuously by radiology nursing throughout the procedure under my direct supervision. FLUOROSCOPY TIME:  0 minutes 12 seconds (7 mGy). COMPLICATIONS: None immediate. PROCEDURE: Informed written consent was obtained from the patient after a discussion of the risks, benefits, and alternatives to treatment. Questions regarding the procedure were encouraged and answered. The right neck and chest were prepped with chlorhexidine in a sterile fashion, and a sterile drape was applied covering the operative field. Maximum barrier sterile technique with sterile gowns and gloves were  used for the procedure. A timeout was performed prior to the initiation of the procedure. After creating a small venotomy incision, a 21 gauge micropuncture kit was utilized to access the internal jugular vein. Real-time ultrasound guidance was utilized for vascular access including the acquisition of a permanent ultrasound image documenting patency of the accessed vessel. A Rosen wire was advanced to the level of the IVC and the micropuncture sheath was exchanged for an 8 Fr dilator. A 14.5 French tunneled hemodialysis  catheter measuring 23 cm from tip to cuff was tunneled in a retrograde fashion from the anterior chest wall to the venotomy incision. Serial dilation was then performed an a peel-away sheath was placed. The catheter was then placed through the peel-away sheath with the catheter tip ultimately positioned within the right atrium. Final catheter positioning was confirmed and documented with a spot radiographic image. The catheter aspirates and flushes normally. The catheter was flushed with appropriate volume heparin dwells. The catheter exit site was secured with a 0-Silk retention suture. The venotomy incision was closed with Dermabond. Sterile dressings were applied. The patient tolerated the procedure well without immediate post procedural complication. IMPRESSION: Successful placement of 23 cm tip to cuff tunneled hemodialysis catheter via the right internal jugular vein with catheter tip terminating within the right atrium. The catheter is ready for immediate use. Ruthann Cancer, MD [facility] Radiology Electronically Signed   By: Ruthann Cancer MD   On: 09/04/2020 15:01   IR US Guide Vasc Access Right  Result Date: 09/04/2020 INDICATION: 70 year old male with end-stage renal disease and malfunctioning left upper extremity fistula. EXAM: TUNNELED CENTRAL VENOUS HEMODIALYSIS CATHETER PLACEMENT WITH ULTRASOUND AND FLUOROSCOPIC GUIDANCE MEDICATIONS: Ancef 2 gm IV . The antibiotic was given in an appropriate time interval prior to skin puncture. ANESTHESIA/SEDATION: Moderate (conscious) sedation was employed during this procedure. A total of Versed 1 mg and Fentanyl 25 mcg was administered intravenously. Moderate Sedation Time: 15 minutes. The patient's level of consciousness and vital signs were monitored continuously by radiology nursing throughout the procedure under my direct supervision. FLUOROSCOPY TIME:  0 minutes 12 seconds (7 mGy). COMPLICATIONS: None  immediate. PROCEDURE: Informed written consent was obtained from the patient after a discussion of the risks, benefits, and alternatives to treatment. Questions regarding the procedure were encouraged and answered. The right neck and chest were prepped with chlorhexidine in a sterile fashion, and a sterile drape was applied covering the operative field. Maximum barrier sterile technique with sterile gowns and gloves were used for the procedure. A timeout was performed prior to the initiation of the procedure. After creating a small venotomy incision, a 21 gauge micropuncture kit was utilized to access the internal jugular vein. Real-time ultrasound guidance was utilized for vascular access including the acquisition of a permanent ultrasound image documenting patency of the accessed vessel. A Rosen wire was advanced to the level of the IVC and the micropuncture sheath was exchanged for an 8 Fr dilator. A 14.5 French tunneled hemodialysis catheter measuring 23 cm from tip to cuff was tunneled in a retrograde fashion from the anterior chest wall to the venotomy incision. Serial dilation was then performed an a peel-away sheath was placed. The catheter was then placed through the peel-away sheath with the catheter tip ultimately positioned within the right atrium. Final catheter positioning was confirmed and documented with a spot radiographic image. The catheter aspirates and flushes normally. The catheter was flushed with appropriate volume heparin dwells. The catheter exit site was  secured with a 0-Silk retention suture. The venotomy incision was closed with Dermabond. Sterile dressings were applied. The patient tolerated the procedure well without immediate post procedural complication. IMPRESSION: Successful placement of 23 cm tip to cuff tunneled hemodialysis catheter via the right internal jugular vein with catheter tip terminating within the right atrium. The catheter is ready for immediate use. Ruthann Cancer, MD  [facility] Radiology Electronically Signed   By: Ruthann Cancer MD   On: 09/04/2020 15:01    Labs: BMET Recent Labs  Lab 09/01/20 1819 09/02/20 0012 09/02/20 0500 09/03/20 0116 09/04/20 0127 09/05/20 0133 09/06/20 0105  NA 132*  --  131* 131* 130* 131* 131*  K 3.8  --  3.7 3.6 3.6 3.9 4.2  CL 97*  --  98 99 99 97* 98  CO2 20*  --  21* 18* 18* 20* 20*  GLUCOSE 245*  --  185* 170* 184* 203* 199*  BUN 123*  --  120* 122* 128* 127* 110*  CREATININE 6.16* 6.27* 6.15* 6.10* 6.44* 6.61* 6.03*  CALCIUM 7.8*  --  7.9* 7.8* 8.0* 8.0* 8.0*  PHOS  --   --   --  8.3* 8.4* 8.4* 6.7*   CBC Recent Labs  Lab 09/01/20 2140 09/02/20 0012 09/03/20 0116 09/03/20 1447 09/04/20 0127 09/05/20 0133 09/06/20 0105  WBC 8.5   < > 7.4  --  7.8 9.0 9.6  NEUTROABS 6.7  --   --   --   --   --   --   HGB 8.1*   < > 6.7* 7.9* 7.3* 7.7* 7.7*  HCT 27.6*   < > 22.9* 26.3* 25.1* 26.9* 26.1*  MCV 79.1*   < > 78.4*  --  79.7* 81.3 79.6*  PLT 252   < > 209  --  228 229 220   < > = values in this interval not displayed.    Medications:     calcium acetate  1,334 mg Oral TID WC   Chlorhexidine Gluconate Cloth  6 each Topical Q0600   [START ON 09/11/2020] darbepoetin (ARANESP) injection - DIALYSIS  200 mcg Intravenous Q Mon-HD   heparin  5,000 Units Subcutaneous Q8H   insulin glargine-yfgn  10 Units Subcutaneous Daily   pantoprazole  40 mg Oral Daily      Otelia Santee, MD 09/06/2020, 12:37 PM

## 2020-09-07 ENCOUNTER — Encounter (HOSPITAL_COMMUNITY): Payer: PPO

## 2020-09-07 ENCOUNTER — Inpatient Hospital Stay (HOSPITAL_COMMUNITY)
Admission: RE | Admit: 2020-09-07 | Discharge: 2020-09-07 | Disposition: A | Discharge status: Home / Self Care | Payer: PPO | Source: Ambulatory Visit | Attending: Nephrology | Admitting: Nephrology

## 2020-09-07 LAB — GLUCOSE, CAPILLARY
Glucose-Capillary: 112 mg/dL — ABNORMAL HIGH (ref 70–99)
Glucose-Capillary: 123 mg/dL — ABNORMAL HIGH (ref 70–99)
Glucose-Capillary: 145 mg/dL — ABNORMAL HIGH (ref 70–99)
Glucose-Capillary: 147 mg/dL — ABNORMAL HIGH (ref 70–99)

## 2020-09-07 LAB — OCCULT BLOOD X 1 CARD TO LAB, STOOL: Fecal Occult Bld: NEGATIVE

## 2020-09-07 LAB — PHOSPHORUS: Phosphorus: 4.7 mg/dL — ABNORMAL HIGH (ref 2.5–4.6)

## 2020-09-07 MED ORDER — AMLODIPINE BESYLATE 5 MG PO TABS
5.0000 mg | ORAL_TABLET | Freq: Every day | ORAL | Status: DC
  Administered 2020-09-07 – 2020-09-08 (×2): 5 mg via ORAL
  Filled 2020-09-07 (×2): qty 1

## 2020-09-07 NOTE — Progress Notes (Signed)
Call made to Travis Blair regarding outpt HD arrangements and to confirm if pt can start outpt chair on Saturday. Per [name] wife has an appointment tomorrow with the clinic to complete paperwork so that patient will be able to go to his new HD clinic on Satuday. As long as paperwork is completed tomorrow- then pt will be good to start his treatment on Saturday at his outpt clinic.

## 2020-09-07 NOTE — Progress Notes (Signed)
[facility] Progress Note   70 year old WM with cardiac issues as well as advanced CKD-  presents with volume overload, anemia and uremia  Assessment/ Plan:   1.Renal- advanced CKD at baseline-  have been prepping for dialysis as OP.  Now with volume overload refractory to meds, and uremia.  Agrees to start dialysis.  Unfortunately  first treatment was not successful Saturday  with AVF.  Will need to consult IR for   [facility] placement; I would hold off on a fistulogram tiill the infiltration settles down in 2 weeks (warm compress).  - Appreciate VIR  placing the RIJ TC on 7/25. - Lives in [place] so can CLIP to [place] OP unit-  also may be able to eventually do home HD. I notified Pam CLIP on 7/25.  Tolerated net UF 2.5L on 7/27; planning on  HD today and Fri for UF. Then he can get HD at [facility] Saturday given the massive volume overload.  [employer] boots should help to mobilize the fluid.  2. Hypertension/volume  - massive volume overload.  On iv lasix for now, making some urine so will continue and increase to max dose -  will do UF with HD also for volume once can establish   3. Anemia  - significant and not helping his DOE and weakness-  will load with iron and give ESA-  transfusion on 7/23  4. Bones-  check PTH- pending  phos up (8.4) -  just started phoslo 2 tabs TIDM. Will recheck phos on Thur -> 6.7 Incr to 3 tabs and will then recheck on Mon.  Subjective:   Unable to use AVF for treatment Saturday and e/o infiltration. He denies dyspnea or nausea  and is tolerated HD yest.     Objective:   BP 136/79 (BP Location: Right Wrist)   Pulse 87   Temp 98.6 F (37 C) (Oral)   Resp 20   Ht 6\' 2"  (1.88 m)   Wt 131.3 kg   SpO2 100%   BMI 37.16 kg/m   Intake/Output Summary (Last 24 hours) at 09/07/2020 1194 Last data filed at 09/07/2020 0654 Gross per 24 hour  Intake 172.42 ml  Output 4050 ml  Net -3877.58 ml   Weight change: -0.7 kg  Physical Exam: General:  sitting up in bedside chair-  pale, fatigued Heart: RRR Lungs: mostly clear Abdomen: obese, soft, non tender Extremities: pitting edema and LE weeping  Dialysis Access: left AVF -  much bruising-  good thrill and bruit  , RIJ TC  Imaging: No results found.  Labs: BMET Recent Labs  Lab 09/01/20 1819 09/02/20 0012 09/02/20 0500 09/03/20 0116 09/04/20 0127 09/05/20 0133 09/06/20 0105  NA 132*  --  131* 131* 130* 131* 131*  K 3.8  --  3.7 3.6 3.6 3.9 4.2  CL 97*  --  98 99 99 97* 98  CO2 20*  --  21* 18* 18* 20* 20*  GLUCOSE 245*  --  185* 170* 184* 203* 199*  BUN 123*  --  120* 122* 128* 127* 110*  CREATININE 6.16* 6.27* 6.15* 6.10* 6.44* 6.61* 6.03*  CALCIUM 7.8*  --  7.9* 7.8* 8.0* 8.0* 8.0*  PHOS  --   --   --  8.3* 8.4* 8.4* 6.7*   CBC Recent Labs  Lab 09/01/20 2140 09/02/20 0012 09/03/20 0116 09/03/20 1447 09/04/20 0127 09/05/20 0133 09/06/20 0105  WBC 8.5   < > 7.4  --  7.8 9.0 9.6  NEUTROABS  6.7  --   --   --   --   --   --   HGB 8.1*   < > 6.7* 7.9* 7.3* 7.7* 7.7*  HCT 27.6*   < > 22.9* 26.3* 25.1* 26.9* 26.1*  MCV 79.1*   < > 78.4*  --  79.7* 81.3 79.6*  PLT 252   < > 209  --  228 229 220   < > = values in this interval not displayed.    Medications:     amLODipine  5 mg Oral Daily   calcium acetate  1,334 mg Oral TID WC   Chlorhexidine Gluconate Cloth  6 each Topical Q0600   [START ON 09/11/2020] darbepoetin (ARANESP) injection - DIALYSIS  200 mcg Intravenous Q Mon-HD   heparin  5,000 Units Subcutaneous Q8H   insulin glargine-yfgn  10 Units Subcutaneous Daily   pantoprazole  40 mg Oral Daily      Otelia Santee, MD 09/07/2020, 9:24 AM

## 2020-09-07 NOTE — Progress Notes (Signed)
PT Cancellation Note  Patient Details Name: Travis Blair MRN: [id number] DOB: [birth year]   Cancelled Treatment:    Reason Eval/Treat Not Completed: Patient at procedure or test/unavailable.    Shary Decamp [facility] 09/07/2020, 12:27 PM Benjamine Strout [place] Pager [phone] Office [phone]

## 2020-09-07 NOTE — Progress Notes (Signed)
PROGRESS NOTE    Travis Blair  IHK:[id number] DOB: Oct 30, 1950 DOA: 09/01/2020 PCP: [name], MD    Brief Narrative:  70 year old male with a history of chronic kidney disease stage V, diastolic heart failure, admitted to the hospital with worsening lower extremity edema and shortness of breath.  He was noted to have worsening renal failure.  He already had an AV fistula placed in preparation for dialysis.  Also was thought to be having acute on chronic diastolic congestive heart failure with pulmonary hypertension.  He was seen by nephrology and they recommended dialysis. -First HD session attempted on 7/24, but unfortunately was not successful with aVF. Patient had tunneled dialysis catheter placed by interventional radiology. First HD session done on 7/26,  Assessment & Plan:   Principal Problem:   Acute on chronic congestive heart failure (HCC) Active Problems:   S/P CABG x 3   Essential hypertension   CKD (chronic kidney disease) stage 5, GFR less than 15 ml/min (HCC)   Acute on chronic diastolic CHF (congestive heart failure) (HCC)   Acute on chronic kidney disease stage V/A1 CKD: -Patient presents with volume overload, elevated creatinine -He already has an AV fistula placed in preparation for dialysis -CLIP in process to arrange outpatient dialysis in [place] -Continued on diuretics since he is still making urine -First HD session attempted on 7/24, but unfortunately was not successful with aVF -Patient had tunneled dialysis catheter placed by interventional radiology  -First HD session done on 7/26, he is scheduled for the second session today. Per nephrology and IR, they would like to wait 2 weeks to do fistulogram.  Patient received second session of hemodialysis/ultrafiltration yesterday.  Per my discussion with Dr. Augustin Coupe of nephrology, due to massive edema/fluid overload, we plan to continue daily dialysis/ultrafiltration daily through Friday and then he is  already set up to receive his first session of dialysis as outpatient on Saturday.  Patient's edema has improved somewhat compared to yesterday.  Acute on chronic diastolic congestive heart failure with pulmonary hypertension -Volume removal to be accomplished through dialysis, no dyspnea or hypoxia but he has severe edema but improved compared to yesterday.  Still with +3 pitting edema bilateral lower extremity but on the left upper extremity now he has +2 pitting edema.  +2 pitting edema right upper extremity as well.  Essential hypertension -Noted to have Coreg on his PTA med list -Patient reports that this was discontinued by his primary care physician due to concerns for bradycardia -Heart rate currently running in the 60s -We will hold further Coreg Blood pressure still elevated.  Will start on amlodipine 5 mg and continue as needed hydralazine.  We will watch closely, if blood pressure on the low normal side, will discontinue amlodipine.  Anemia -Suspect a large component from underlying renal disease -Started on iron infusion as well as ESA -Recently, he was noted to have heme positive stools by his PCP, he did not have any evidence of gross GI bleeding -He was to undergo outpatient GI work-up, but was unable to safely perform the studies due to his volume overload and inability to lay flat on the table -Further GI evaluation has been rescheduled for next month -Hemoglobin down to nadir of 6.7 during this admission for which he received 1 unit of PRBC on 7/24, follow-up hemoglobin has improved and remains a stable around 7.5 range. -Continue to monitor hemoglobin and transfuse for hemoglobin less than 7  Recent history of right lower lobe PE and left  lower extremity DVT diagnosed 12/2019 -Patient was taking Eliquis up until 06/2020 -This was discontinued when he was noted to have heme positive stools -Venous Dopplers of lower extremity rechecked and did not show any residual  DVT -Suspect VTE has been adequately treated with 6 months of anticoagulation  Atrial fibrillation -Heart rate is currently stable, off Coreg. -He was taken off anticoagulation by his primary care physician due to having heme positive stools -Outpatient GI evaluation has been scheduled  Obstructive sleep apnea -Continue on CPAP  Coronary artery disease status post CABG -No chest pain at this time  Type 2 diabetes -Patient was having hypoglycemic episodes prior to admission Currently blood sugar stable.  Continue Lantus 10 units and SSI.  DVT prophylaxis: heparin injection 5,000 Units Start: 09/01/20 2330  Code Status: Full code Family Communication: Discussed with patient, no family present at Disposition Plan: Status is: Inpatient  Remains inpatient appropriate because: Needs ongoing daily dialysis.  Dispo: The patient is from: Home              Anticipated d/c is to: Home              Patient currently is not medically stable to d/c.   Difficult to place patient No         Consultants:  Nephrology Interventional radiology  Procedures:  7/25 tunneled dialysis catheter placement by interventional radiology  Antimicrobials:      Subjective: Seen and examined.  He has no complaints.  Objective: Vitals:   09/06/20 2105 09/06/20 2317 09/07/20 0650 09/07/20 0922  BP: (!) 141/74 (!) 147/107  136/79  Pulse:  80  87  Resp: 18 15 (!) 21 20  Temp: 97.9 F (36.6 C) 98.6 F (37 C)  98.2 F (36.8 C)  TempSrc: Oral Oral  Oral  SpO2: 98% 96%  100%  Weight:   131.3 kg   Height:        Intake/Output Summary (Last 24 hours) at 09/07/2020 1200 Last data filed at 09/07/2020 0654 Gross per 24 hour  Intake 172.42 ml  Output 4050 ml  Net -3877.58 ml    Filed Weights   09/06/20 0420 09/06/20 1435 09/07/20 0650  Weight: 134.8 kg 134.8 kg 131.3 kg    Examination:  General exam: Appears calm and comfortable  Respiratory system: Clear to auscultation. Respiratory  effort normal. Cardiovascular system: S1 & S2 heard, RRR. No JVD, murmurs, rubs, gallops or clicks.  +3 pitting edema bilateral lower extremity, +2 pitting edema bilateral upper extremity. Gastrointestinal system: Abdomen is nondistended, soft and nontender. No organomegaly or masses felt. Normal bowel sounds heard. Central nervous system: Alert and oriented. No focal neurological deficits. Extremities: Symmetric 5 x 5 power. Skin: No rashes, lesions or ulcers.  Psychiatry: Judgement and insight appear normal. Mood & affect appropriate.   Data Reviewed: I have personally reviewed following labs and imaging studies  CBC: Recent Labs  Lab 09/01/20 2140 09/02/20 0012 09/02/20 0500 09/03/20 0116 09/03/20 1447 09/04/20 0127 09/05/20 0133 09/06/20 0105  WBC 8.5   < > 7.0 7.4  --  7.8 9.0 9.6  NEUTROABS 6.7  --   --   --   --   --   --   --   HGB 8.1*   < > 7.4* 6.7* 7.9* 7.3* 7.7* 7.7*  HCT 27.6*   < > 26.0* 22.9* 26.3* 25.1* 26.9* 26.1*  MCV 79.1*   < > 80.2 78.4*  --  79.7* 81.3 79.6*  PLT 252   < >  254 209  --  228 229 220   < > = values in this interval not displayed.    Basic Metabolic Panel: Recent Labs  Lab 09/02/20 0500 09/03/20 0116 09/04/20 0127 09/05/20 0133 09/06/20 0105  NA 131* 131* 130* 131* 131*  K 3.7 3.6 3.6 3.9 4.2  CL 98 99 99 97* 98  CO2 21* 18* 18* 20* 20*  GLUCOSE 185* 170* 184* 203* 199*  BUN 120* 122* 128* 127* 110*  CREATININE 6.15* 6.10* 6.44* 6.61* 6.03*  CALCIUM 7.9* 7.8* 8.0* 8.0* 8.0*  PHOS  --  8.3* 8.4* 8.4* 6.7*    GFR: Estimated Creatinine Clearance: 16.6 mL/min (A) (by C-G formula based on SCr of 6.03 mg/dL (H)). Liver Function Tests: Recent Labs  Lab 09/01/20 1819 09/03/20 0116 09/04/20 0127 09/05/20 0133 09/06/20 0105  AST 17  --   --   --   --   ALT 15  --   --   --   --   ALKPHOS 55  --   --   --   --   BILITOT 0.8  --   --   --   --   PROT 6.0*  --   --   --   --   ALBUMIN 2.5* 2.3* 2.3* 2.4* 2.4*    No results for  input(s): LIPASE, AMYLASE in the last 168 hours. No results for input(s): AMMONIA in the last 168 hours. Coagulation Profile: No results for input(s): INR, PROTIME in the last 168 hours. Cardiac Enzymes: No results for input(s): CKTOTAL, CKMB, CKMBINDEX, TROPONINI in the last 168 hours. BNP (last 3 results) No results for input(s): PROBNP in the last 8760 hours. HbA1C: No results for input(s): HGBA1C in the last 72 hours. CBG: Recent Labs  Lab 09/06/20 0618 09/06/20 1103 09/06/20 1854 09/06/20 2139 09/07/20 0643  GLUCAP 162* 177* 118* 150* 112*    Lipid Profile: No results for input(s): CHOL, HDL, LDLCALC, TRIG, CHOLHDL, LDLDIRECT in the last 72 hours. Thyroid Function Tests: No results for input(s): TSH, T4TOTAL, FREET4, T3FREE, THYROIDAB in the last 72 hours. Anemia Panel: No results for input(s): VITAMINB12, FOLATE, FERRITIN, TIBC, IRON, RETICCTPCT in the last 72 hours. Sepsis Labs: No results for input(s): PROCALCITON, LATICACIDVEN in the last 168 hours.  Recent Results (from the past 240 hour(s))  Resp Panel by RT-PCR (Flu A&B, Covid) Nasopharyngeal Swab     Status: None   Collection Time: 09/01/20  9:06 PM   Specimen: Nasopharyngeal Swab; Nasopharyngeal(NP) swabs in vial transport medium  Result Value Ref Range Status   SARS Coronavirus 2 by RT PCR NEGATIVE NEGATIVE Final    Comment: (NOTE) SARS-CoV-2 target nucleic acids are NOT DETECTED.  The SARS-CoV-2 RNA is generally detectable in upper respiratory specimens during the acute phase of infection. The lowest concentration of SARS-CoV-2 viral copies this assay can detect is 138 copies/mL. A negative result does not preclude SARS-Cov-2 infection and should not be used as the sole basis for treatment or other patient management decisions. A negative result may occur with  improper specimen collection/handling, submission of specimen other than nasopharyngeal swab, presence of viral mutation(s) within the areas  targeted by this assay, and inadequate number of viral copies(<138 copies/mL). A negative result must be combined with clinical observations, patient history, and epidemiological information. The expected result is Negative.  Fact Sheet for Patients:  EntrepreneurPulse.com.au  Fact Sheet for Healthcare Providers:  IncredibleEmployment.be  This test is no t yet approved or cleared by the [place]  States FDA and  has been authorized for detection and/or diagnosis of SARS-CoV-2 by FDA under an Emergency Use Authorization (EUA). This EUA will remain  in effect (meaning this test can be used) for the duration of the COVID-19 declaration under Section 564(b)(1) of the Act, 21 U.S.C.section 360bbb-3(b)(1), unless the authorization is terminated  or revoked sooner.       Influenza A by PCR NEGATIVE NEGATIVE Final   Influenza B by PCR NEGATIVE NEGATIVE Final    Comment: (NOTE) The Xpert Xpress SARS-CoV-2/FLU/RSV plus assay is intended as an aid in the diagnosis of influenza from Nasopharyngeal swab specimens and should not be used as a sole basis for treatment. Nasal washings and aspirates are unacceptable for Xpert Xpress SARS-CoV-2/FLU/RSV testing.  Fact Sheet for Patients: EntrepreneurPulse.com.au  Fact Sheet for Healthcare Providers: IncredibleEmployment.be  This test is not yet approved or cleared by the [place] FDA and has been authorized for detection and/or diagnosis of SARS-CoV-2 by FDA under an Emergency Use Authorization (EUA). This EUA will remain in effect (meaning this test can be used) for the duration of the COVID-19 declaration under Section 564(b)(1) of the Act, 21 U.S.C. section 360bbb-3(b)(1), unless the authorization is terminated or revoked.  Performed at [facility], [place] [address] [place], [place] [address]           Radiology Studies: No results  found.      Scheduled Meds:  amLODipine  5 mg Oral Daily   calcium acetate  1,334 mg Oral TID WC   Chlorhexidine Gluconate Cloth  6 each Topical Q0600   [START ON 09/11/2020] darbepoetin (ARANESP) injection - DIALYSIS  200 mcg Intravenous Q Mon-HD   heparin  5,000 Units Subcutaneous Q8H   insulin glargine-yfgn  10 Units Subcutaneous Daily   pantoprazole  40 mg Oral Daily   Continuous Infusions:  furosemide 160 mg (09/07/20 0624)     LOS: 6 days    Time spent: 30 mins  Darliss Cheney, MD [facility]   If 7PM-7AM, please contact night-coverage [email or web address]  09/07/2020, 12:00 PM

## 2020-09-07 NOTE — Evaluation (Signed)
Occupational Therapy Evaluation/Discharge Patient Details Name: Travis Blair MRN: [id number] DOB: 1950-02-26 Today's Date: 09/07/2020    History of Present Illness ZELIG GACEK is a 70 y.o. male presenting with worsening SOB. Chest Xray shows edema and B LE/abdominal edema noted. Pt noted with worsening renal failure and started on hemodialysis. PMH: CAD s/p CABG, CKD, CHF, HTN, DM II, chronic anemia   Clinical Impression   PTA, pt lives with spouse and reports Modified Independence with ADLs and mobility using cane though reports increasing difficulty due to poor endurance. Pt presents now with improved activity tolerance and continued fluid removal. Pt able to mobilize in room using RW at Supervision level without safety concerns. Pt Independent for UB ADLs and Min A for LB ADLs due to difficulty reaching feet secondary to edema. Educated pt/wife on positional strategies or AE to trial at home. Wife confirms she can assist as needed at home. No further skilled OT services needed at acute level or on DC. OT to sign off.     Follow Up Recommendations  No OT follow up    Equipment Recommendations  Other (comment) (Rolling walker; wife declines BSC)    Recommendations for Other Services       Precautions / Restrictions Precautions Precautions: Fall;Other (comment) Precaution Comments: edema in all extremities Restrictions Weight Bearing Restrictions: No      Mobility Bed Mobility               General bed mobility comments: up in chair    Transfers Overall transfer level: Needs assistance Equipment used: Rolling walker (2 wheeled) Transfers: Sit to/from Stand Sit to Stand: Supervision         General transfer comment: supervision but no assist needed    Balance Overall balance assessment: Needs assistance Sitting-balance support: No upper extremity supported;Feet supported Sitting balance-Leahy Scale: Good     Standing balance support: Bilateral  upper extremity supported;During functional activity Standing balance-Leahy Scale: Fair Standing balance comment: fair static standing at sink, use of BUE support for mobility                           ADL either performed or assessed with clinical judgement   ADL Overall ADL's : Needs assistance/impaired Eating/Feeding: Independent;Sitting   Grooming: Set up;Standing;Oral care Grooming Details (indicate cue type and reason): no LOB or safety concerns. able to reach across for items at sink Upper Body Bathing: Independent;Sitting   Lower Body Bathing: Minimal assistance   Upper Body Dressing : Independent;Sitting   Lower Body Dressing: Minimal assistance;Sit to/from stand Lower Body Dressing Details (indicate cue type and reason): difficulty reaching socks, reports unable to cross LEs due to swelling. educated and collaborated with pt/wife on compensatory strategies, positional strategies to trial or use of AE. Wife can assist as needed Toilet Transfer: Supervision/safety;Ambulation;RW   Toileting- Clothing Manipulation and Hygiene: Supervision/safety;Sit to/from stand       Functional mobility during ADLs: Supervision/safety;Rolling walker General ADL Comments: Improving endurance per pt report, as well as decreased swelling (though still present). Educated on strategies for LB ADLs and safety with bathroom setup     Vision Baseline Vision/History: Wears glasses Wears Glasses: At all times Patient Visual Report: No change from baseline Vision Assessment?: No apparent visual deficits     Perception     Praxis      Pertinent Vitals/Pain Pain Assessment: No/denies pain     Hand Dominance Right   Extremity/Trunk Assessment Upper  Extremity Assessment Upper Extremity Assessment: Overall WFL for tasks assessed (though edematous (primarily hands))   Lower Extremity Assessment Lower Extremity Assessment: Defer to PT evaluation   Cervical / Trunk  Assessment Cervical / Trunk Assessment: Normal   Communication Communication Communication: No difficulties   Cognition Arousal/Alertness: Awake/alert Behavior During Therapy: WFL for tasks assessed/performed Overall Cognitive Status: Within Functional Limits for tasks assessed                                     General Comments  VSS on RA. Wife entering during session with questions about renal diet recommendations, insulin needs at DC. Coordinated with RN for further info/recommendations    Exercises     Shoulder Instructions      Home Living Family/patient expects to be discharged to:: Private residence Living Arrangements: Spouse/significant other Available Help at Discharge: Family;Available 24 hours/day Type of Home: House Home Access: Stairs to enter [employer] of Steps: 3 Entrance Stairs-Rails: Left Home Layout: One level         Bathroom Toilet: Handicapped height     Home Equipment: Environmental consultant - 4 wheels;Cane - single point;Adaptive equipment Adaptive Equipment: Long-handled shoe horn        Prior Functioning/Environment Level of Independence: Independent with assistive device(s)        Comments: able to ambulate with SPC and perform ADLs though increasing difficulty due to poor endurance/SOB. Wife completes IADLs        OT Problem List:        OT Treatment/Interventions:      OT Goals(Current goals can be found in the care plan section) Acute Rehab OT Goals Patient Stated Goal: go home when ready OT Goal Formulation: All assessment and education complete, DC therapy  OT Frequency:     Barriers to D/C:            Co-evaluation              AM-PAC OT "6 Clicks" Daily Activity     Outcome Measure Help from another person eating meals?: None Help from another person taking care of personal grooming?: None Help from another person toileting, which includes using toliet, bedpan, or urinal?: A Little Help from  another person bathing (including washing, rinsing, drying)?: A Little Help from another person to put on and taking off regular upper body clothing?: None Help from another person to put on and taking off regular lower body clothing?: A Little 6 Click Score: 21   End of Session Equipment Utilized During Treatment: Rolling walker Nurse Communication: Mobility status;Other (comment) (questions about renal diet, insulin)  Activity Tolerance: Patient tolerated treatment well Patient left: in chair;with call bell/phone within reach;with family/visitor present  OT Visit Diagnosis: Other abnormalities of gait and mobility (R26.89)                Time: 6503-5465 OT Time Calculation (min): 24 min Charges:  OT General Charges $OT Visit: 1 Visit OT Evaluation $OT Eval Low Complexity: 1 Low OT Treatments $Self Care/Home Management : 8-22 mins  Malachy Chamber, OTR/L Acute Rehab Services Office: [phone]   Layla Maw 09/07/2020, 12:05 PM

## 2020-09-08 ENCOUNTER — Ambulatory Visit: Payer: PPO | Admitting: Podiatry

## 2020-09-08 LAB — CBC
HCT: 28.4 % — ABNORMAL LOW (ref 39.0–52.0)
Hemoglobin: 8.2 g/dL — ABNORMAL LOW (ref 13.0–17.0)
MCH: 24 pg — ABNORMAL LOW (ref 26.0–34.0)
MCHC: 28.9 g/dL — ABNORMAL LOW (ref 30.0–36.0)
MCV: 83 fL (ref 80.0–100.0)
Platelets: 214 10*3/uL (ref 150–400)
RBC: 3.42 MIL/uL — ABNORMAL LOW (ref 4.22–5.81)
RDW: 21 % — ABNORMAL HIGH (ref 11.5–15.5)
WBC: 9.2 10*3/uL (ref 4.0–10.5)
nRBC: 0 % (ref 0.0–0.2)

## 2020-09-08 LAB — RENAL FUNCTION PANEL
Albumin: 2.3 g/dL — ABNORMAL LOW (ref 3.5–5.0)
Anion gap: 13 (ref 5–15)
BUN: 75 mg/dL — ABNORMAL HIGH (ref 8–23)
CO2: 23 mmol/L (ref 22–32)
Calcium: 8.4 mg/dL — ABNORMAL LOW (ref 8.9–10.3)
Chloride: 96 mmol/L — ABNORMAL LOW (ref 98–111)
Creatinine, Ser: 5.22 mg/dL — ABNORMAL HIGH (ref 0.61–1.24)
GFR, Estimated: 11 mL/min — ABNORMAL LOW (ref >60)
Glucose, Bld: 151 mg/dL — ABNORMAL HIGH (ref 70–99)
Phosphorus: 4.6 mg/dL (ref 2.5–4.6)
Potassium: 3.5 mmol/L (ref 3.5–5.1)
Sodium: 132 mmol/L — ABNORMAL LOW (ref 135–145)

## 2020-09-08 LAB — GLUCOSE, CAPILLARY
Glucose-Capillary: 118 mg/dL — ABNORMAL HIGH (ref 70–99)
Glucose-Capillary: 103 mg/dL — ABNORMAL HIGH (ref 70–99)

## 2020-09-08 MED ORDER — FUROSEMIDE 80 MG PO TABS
ORAL_TABLET | ORAL | 0 refills | Status: DC
Start: 2020-09-08 — End: 2020-10-06

## 2020-09-08 MED ORDER — DOXYCYCLINE HYCLATE 100 MG PO CAPS: 100.0000 mg | ORAL_CAPSULE | Freq: Two times a day (BID) | ORAL | 0 refills | Status: AC

## 2020-09-08 MED ORDER — GLIPIZIDE 5 MG PO TABS: 5.0000 mg | ORAL_TABLET | Freq: Two times a day (BID) | ORAL | 11 refills | Status: DC

## 2020-09-08 MED ORDER — HEPARIN SODIUM (PORCINE) 1000 UNIT/ML IJ SOLN
INTRAMUSCULAR | Status: AC
  Administered 2020-09-08: 1000 [IU]
  Filled 2020-09-08: qty 4

## 2020-09-08 NOTE — Progress Notes (Signed)
[facility] Progress Note   70 year old WM with cardiac issues as well as advanced CKD-  presents with volume overload, anemia and uremia  Assessment/ Plan:   1.Renal- advanced CKD at baseline-  have been prepping for dialysis as OP.  Now with volume overload refractory to meds, and uremia.  Agrees to start dialysis.  Unfortunately  first treatment was not successful Saturday  with AVF.  Will need to consult IR for   [facility] placement; I would hold off on a fistulogram tiill the infiltration settles down in 2 weeks (warm compress).  - Appreciate VIR  placing the RIJ TC on 7/25. - Lives in [place] so CLIP to [facility] TTS 2nd shift at 1130AM; wife notified as well and she will fill out necessary paper work today.  Tolerated net UF 2.5L on 7/27 and 3L net 7/28.  Seen on HD today  3K bath through RIJ TC  127/54 Goal net UF 3L and he is tolerating  He can get HD at [facility] Saturday given the massive volume overload; center will be notified to continue aggressively challenging EDW + leg compression to mobilize fluid.   2. Hypertension/volume  - massive volume overload.  On iv lasix for now, making some urine so will continue and increase to max dose -  will do UF with HD also for volume once can establish   3. Anemia  - significant and not helping his DOE and weakness-  will load with iron and give ESA-  transfusion on 7/23  4. Bones-  check PTH- pending  phos up (8.4) -  just started phoslo 2 tabs TIDM. Will recheck phos on Thur -> 6.7 Incr to 3 tabs and will then recheck on Mon.  Subjective:   Unable to use AVF for treatment Saturday and e/o infiltration. He denies dyspnea or nausea  and is tolerated HD yest.     Objective:   BP (!) 134/56   Pulse 71   Temp 98.2 F (36.8 C) (Oral)   Resp (!) 25   Ht 6\' 2"  (1.88 m)   Wt 131.4 kg   SpO2 94%   BMI 37.19 kg/m   Intake/Output Summary (Last 24 hours) at 09/08/2020 6213 Last data filed at 09/08/2020 0600 Gross  per 24 hour  Intake 392.89 ml  Output 3875 ml  Net -3482.11 ml   Weight change: -3.5 kg  Physical Exam: General: supine in bed -  pale, fatigued Heart: RRR Lungs: mostly clear Abdomen: obese, soft, non tender Extremities: pitting edema and LE weeping  Dialysis Access: left AVF -  much bruising but improving -  good thrill and bruit  , RIJ TC  Imaging: No results found.  Labs: BMET Recent Labs  Lab 09/01/20 1819 09/02/20 0012 09/02/20 0500 09/03/20 0116 09/04/20 0127 09/05/20 0133 09/06/20 0105 09/07/20 1827 09/08/20 0736  NA 132*  --  131* 131* 130* 131* 131*  --  132*  K 3.8  --  3.7 3.6 3.6 3.9 4.2  --  3.5  CL 97*  --  98 99 99 97* 98  --  96*  CO2 20*  --  21* 18* 18* 20* 20*  --  23  GLUCOSE 245*  --  185* 170* 184* 203* 199*  --  151*  BUN 123*  --  120* 122* 128* 127* 110*  --  75*  CREATININE 6.16* 6.27* 6.15* 6.10* 6.44* 6.61* 6.03*  --  5.22*  CALCIUM 7.8*  --  7.9*  7.8* 8.0* 8.0* 8.0*  --  8.4*  PHOS  --   --   --  8.3* 8.4* 8.4* 6.7* 4.7* 4.6   CBC Recent Labs  Lab 09/01/20 2140 09/02/20 0012 09/04/20 0127 09/05/20 0133 09/06/20 0105 09/08/20 0736  WBC 8.5   < > 7.8 9.0 9.6 9.2  NEUTROABS 6.7  --   --   --   --   --   HGB 8.1*   < > 7.3* 7.7* 7.7* 8.2*  HCT 27.6*   < > 25.1* 26.9* 26.1* 28.4*  MCV 79.1*   < > 79.7* 81.3 79.6* 83.0  PLT 252   < > 228 229 220 214   < > = values in this interval not displayed.    Medications:     amLODipine  5 mg Oral Daily   calcium acetate  1,334 mg Oral TID WC   Chlorhexidine Gluconate Cloth  6 each Topical Q0600   [START ON 09/11/2020] darbepoetin (ARANESP) injection - DIALYSIS  200 mcg Intravenous Q Mon-HD   heparin  5,000 Units Subcutaneous Q8H   insulin glargine-yfgn  10 Units Subcutaneous Daily   pantoprazole  40 mg Oral Daily      Otelia Santee, MD 09/08/2020, 9:25 AM

## 2020-09-08 NOTE — Plan of Care (Signed)

## 2020-09-08 NOTE — Progress Notes (Signed)
PT Cancellation Note  Patient Details Name: Travis Blair MRN: [id number] DOB: 17-Sep-1950   Cancelled Treatment:    Reason Eval/Treat Not Completed: Patient at procedure or test/unavailable. Pt again in HD. Will continue attempts to evaluate.   Shary Decamp [facility] 09/08/2020, 8:51 AM [place] Pager [phone] Office [phone]

## 2020-09-08 NOTE — Evaluation (Signed)
Physical Therapy Evaluation Patient Details Name: Travis Blair MRN: [id number] DOB: 04/07/1950 Today's Date: 09/08/2020   History of Present Illness  Travis Blair is a 70 y.o. male presenting with worsening SOB. Chest Xray shows edema and B LE/abdominal edema noted. Pt noted with worsening renal failure and started on hemodialysis. PMH: CAD s/p CABG, CKD, CHF, HTN, DM II, chronic anemia  Clinical Impression  Pt presents to PT with decr mobility and generalized weakness and deconditioning. Pt with supportive family and ready for dc home with family. HHPT can continue to work toward incr mobility and independence.     Follow Up Recommendations Home health PT;Supervision - Intermittent    Equipment Recommendations  Rolling walker with 5" wheels (already received)    Recommendations for Other Services       Precautions / Restrictions Precautions Precautions: Fall;Other (comment) Precaution Comments: edema in all extremities      Mobility  Bed Mobility               General bed mobility comments: up in chair    Transfers Overall transfer level: Needs assistance Equipment used: Rolling walker (2 wheeled) Transfers: Sit to/from Stand Sit to Stand: Supervision         General transfer comment: Supervision for safety but no physical assist  Ambulation/Gait Ambulation/Gait assistance: Supervision Gait Distance (Feet): 100 Feet Assistive device: Rolling walker (2 wheeled) Gait Pattern/deviations: Step-through pattern;Decreased stride length Gait velocity: decr Gait velocity interpretation: 1.31 - 2.62 ft/sec, indicative of limited community ambulator General Gait Details: Steady gait with walker. I limited pt's distance since he is readying to dc home and didn't want to fatigue him too much  Stairs            Wheelchair Mobility    Modified Rankin (Stroke Patients Only)       Balance Overall balance assessment: Needs  assistance Sitting-balance support: No upper extremity supported;Feet supported Sitting balance-Leahy Scale: Good     Standing balance support: During functional activity;No upper extremity supported Standing balance-Leahy Scale: Fair                               Pertinent Vitals/Pain Pain Assessment: No/denies pain    Home Living Family/patient expects to be discharged to:: Private residence Living Arrangements: Spouse/significant other Available Help at Discharge: Family;Available 24 hours/day Type of Home: House Home Access: Stairs to enter Entrance Stairs-Rails: Left Entrance Stairs-Number of Steps: 3 Home Layout: One level Home Equipment: Walker - 4 wheels;Cane - single point;Adaptive equipment      Prior Function Level of Independence: Independent with assistive device(s)         Comments: able to ambulate with SPC and perform ADLs though increasing difficulty due to poor endurance/SOB. Wife completes IADLs     Hand Dominance   Dominant Hand: Right    Extremity/Trunk Assessment   Upper Extremity Assessment Upper Extremity Assessment: Defer to OT evaluation    Lower Extremity Assessment Lower Extremity Assessment: Generalized weakness    Cervical / Trunk Assessment Cervical / Trunk Assessment: Normal  Communication   Communication: No difficulties  Cognition Arousal/Alertness: Awake/alert Behavior During Therapy: WFL for tasks assessed/performed Overall Cognitive Status: Within Functional Limits for tasks assessed                                          General Comments General comments (skin integrity, edema, etc.): VSS on RA    Exercises     Assessment/Plan    PT Assessment All further PT needs can be met in the next venue of care  PT Problem List Decreased strength;Decreased activity tolerance;Decreased balance;Decreased mobility       PT Treatment Interventions      PT Goals (Current goals can be found in  the Care Plan section)  Acute Rehab PT Goals Patient Stated Goal: go home PT Goal Formulation: All assessment and education complete, DC therapy    Frequency     Barriers to discharge        Co-evaluation               AM-PAC PT "6 Clicks" Mobility  Outcome Measure Help needed turning from your back to your side while in a flat bed without using bedrails?: None Help needed moving from lying on your back to sitting on the side of a flat bed without using bedrails?: None Help needed moving to and from a bed to a chair (including a wheelchair)?: A Little Help needed standing up from a chair using your arms (e.g., wheelchair or bedside chair)?: A Little Help needed to walk in hospital room?: A Little Help needed climbing 3-5 steps with a railing? : A Little 6 Click Score: 20    End of Session   Activity Tolerance: Patient tolerated treatment well Patient left: in chair;with call bell/phone within reach;with family/visitor present   PT Visit Diagnosis: Other abnormalities of gait and mobility (R26.89);Muscle weakness (generalized) (M62.81)    Time: 2575-0518 PT Time Calculation (min) (ACUTE ONLY): 17 min   Charges:   PT Evaluation $PT Eval Low Complexity: [place] Pager [phone] Office [place] 09/08/2020, 3:08 PM

## 2020-09-08 NOTE — Progress Notes (Signed)
Pt discharged to home with wife.  Pt taken off telemetry and CCMD notified.  Pt's IV removed.  Pt left with all of their personal belongings, including walker.  AVS documentation reviewed and sent home with Pt and all question answered.

## 2020-09-08 NOTE — Discharge Summary (Addendum)
Physician Discharge Summary  Travis Blair MSX:[id number] DOB: 1950/08/26 DOA: 09/01/2020  PCP: [name], MD  Admit date: 09/01/2020 Discharge date: 09/08/2020 30 Day Unplanned Readmission Risk Score    Flowsheet Row ED to Hosp-Admission (Current) from 09/01/2020 in [facility]  30 Day Unplanned Readmission Risk Score (%) 19.62 Filed at 09/08/2020 0801       This score is the patient's risk of an unplanned readmission within 30 days of being discharged (0 -100%). The score is based on dignosis, age, lab data, medications, orders, and past utilization.   Low:  0-14.9   Medium: 15-21.9   High: 22-29.9   Extreme: 30 and above          Admitted From: Home Disposition: Home  Recommendations for Outpatient Follow-up:  Follow up with PCP in 1-2 weeks Up with outpatient dialysis starting Saturday Please obtain BMP/CBC in one week Please follow up with your PCP on the following pending results: Unresulted Labs (From admission, onward)     Start     Ordered   09/05/20 1730  Occult blood card to lab, stool  Once,   R        09/05/20 [place]: Yes Equipment/Devices: Rolling walker  Discharge Condition: Stable CODE STATUS: Full code Diet recommendation: Renal and diabetic.  Subjective: Seen and examined in dialysis unit.  He has no complaints.  He is excited to go home today.  Brief/Interim Summary: 70 year old male with a history of chronic kidney disease stage V, diastolic heart failure, admitted to the hospital with worsening lower extremity edema and shortness of breath.  He was noted to have worsening renal failure.  He already had an AV fistula placed in preparation for dialysis.  Also was thought to be having acute on chronic diastolic congestive heart failure with pulmonary hypertension, treated with hemodialysis sessions.  He was seen by nephrology and they recommended dialysis. -First HD session attempted on  7/24, but unfortunately was not successful with aVF. Patient had tunneled dialysis catheter placed by interventional radiology. First HD session done on 7/26, he received hemodialysis session every day since then until today 09/08/2020.  He is already scheduled to start his outpatient dialysis tomorrow.  Patient still has 3+ pitting edema all 4 extremities.  Discussed with Dr. Augustin Coupe of nephrology, he is comfortable and is recommending to discharge home stating that it will take several sessions of dialysis for his edema to get better.  Patient has no shortness of breath or other complaint.  He was assessed by PT OT and they recommended home health and rolling walker which are ordered for him.  He is being discharged in stable condition.   Essential hypertension -Noted to have Coreg on his PTA med list -Patient reports that this was discontinued by his primary care physician due to concerns for bradycardia.  Pressure was initially elevated, likely due to fluid overload.  With subsequent sessions of dialysis, blood pressure improved.  No medications at discharge.   Anemia -Suspect a large component from underlying renal disease -Started on iron infusion as well as ESA -Recently, he was noted to have heme positive stools by his PCP, he did not have any evidence of gross GI bleeding -He was to undergo outpatient GI work-up, but was unable to safely perform the studies due to his volume overload and inability to lay flat on the table -Further GI evaluation has been  rescheduled for next month -Hemoglobin down to nadir of 6.7 during this admission for which he received 1 unit of PRBC on 7/24, f hemoglobin today is 8.2.   Recent history of right lower lobe PE and left lower extremity DVT diagnosed 12/2019 -Patient was taking Eliquis up until 06/2020 -This was discontinued when he was noted to have heme positive stools -Venous Dopplers of lower extremity rechecked and did not show any residual DVT -Suspect VTE  has been adequately treated with 6 months of anticoagulation   Atrial fibrillation -Heart rate is currently stable, off Coreg. -He was taken off anticoagulation by his primary care physician due to having heme positive stools -Outpatient GI evaluation has been scheduled   Obstructive sleep apnea -Continue on CPAP   Coronary artery disease status post CABG -No chest pain at this time   Type 2 diabetes -Patient was having hypoglycemic episodes prior to admission thus he was not any medications however his blood sugar was slightly elevated so he was started on 10 units of Lantus and controlled with SSI as well.  I am discharging him on low-dose of glipizide 5 mg p.o. twice daily.  His recent hemoglobin A1c was 8.0.  Patient has some oozing and erythema around the right antecubital region and family is concerned about infection: will discharge patient on Doxycycline $RemoveBefore'100mg'eHtpeRxHRQetH$  bid for 5 days.  Discharge Diagnoses:  Principal Problem:   Acute on chronic congestive heart failure (HCC) Active Problems:   S/P CABG x 3   Essential hypertension   CKD (chronic kidney disease) stage 5, GFR less than 15 ml/min (HCC)   Acute on chronic diastolic CHF (congestive heart failure) (HCC)    Discharge Instructions   Allergies as of 09/08/2020       Reactions   Latex Hives, Rash   Lipitor [atorvastatin]    Leg aches        Medication List     STOP taking these medications    carvedilol 6.25 MG tablet Commonly known as: COREG   doxycycline 100 MG tablet Commonly known as: VIBRA-TABS Replaced by: doxycycline 100 MG capsule   metolazone 5 MG tablet Commonly known as: ZAROXOLYN   torsemide 20 MG tablet Commonly known as: DEMADEX       TAKE these medications    albuterol 108 (90 Base) MCG/ACT inhaler Commonly known as: VENTOLIN HFA Inhale 1-2 puffs into the lungs every 6 (six) hours as needed for wheezing or shortness of breath.   doxycycline 100 MG capsule Commonly known as:  VIBRAMYCIN Take 1 capsule (100 mg total) by mouth 2 (two) times daily for 5 days. Replaces: doxycycline 100 MG tablet   ferrous sulfate 325 (65 FE) MG tablet Commonly known as: FerrouSul Take 1 tablet (325 mg total) by mouth daily with breakfast.   fluticasone 50 MCG/ACT nasal spray Commonly known as: FLONASE Place 2 sprays into both nostrils daily. What changed:  when to take this reasons to take this   furosemide 80 MG tablet Commonly known as: Lasix Take 1 tablet p.o. twice daily on nondialysis days (M,W,F,Sunday), hold if urine output decreases.   glipiZIDE 5 MG tablet Commonly known as: GLUCOTROL Take 1 tablet (5 mg total) by mouth 2 (two) times daily.   guaiFENesin-codeine 100-10 MG/5ML syrup Take 10 mLs by mouth every 6 (six) hours as needed for cough.   pantoprazole 40 MG tablet Commonly known as: PROTONIX Take 1 tablet (40 mg total) by mouth daily.   Repatha SureClick 191 MG/ML Soaj Generic drug:  Evolocumab Inject 1 pen into the skin every 14 (fourteen) days.               Durable Medical Equipment  (From admission, onward)           Start     Ordered   09/08/20 1001  For home use only DME Walker rolling  Once       Question Answer Comment  Walker: With 5 Inch Wheels   Patient needs a walker to treat with the following condition Balance problem      09/08/20 1000              Follow-up Information     [name], MD Follow up in 1 week(s).   Specialty: Family Medicine Contact information: [address] [place] [address] [phone]         Nahser, Wonda Cheng, MD .   Specialty: Cardiology Contact information: [address] [place] [address] [phone]                Allergies  Allergen Reactions   Latex Hives and Rash   Lipitor [Atorvastatin]     Leg aches     Consultations: Nephrology   Procedures/Studies: DG Chest 2 View  Result Date: 09/01/2020 CLINICAL DATA:   Shortness of breath. Pt endorses feeling ill and worsened the past 2 days. States he has a low hgb with blood drawn last week. Also having chest tightness and leg swelling. Pt has bilateral legs wrapped. EXAM: CHEST - 2 VIEW COMPARISON:  Chest x-ray 07/24/2020 FINDINGS: The heart size and mediastinal contours are unchanged. Aortic calcification. Cardiac surgical changes overlie the mediastinum. Hilar vasculature prominence. Bilateral lower lobe hazy airspace opacities. Mild pulmonary edema. Persistent bilateral at least trace volume pleural effusions, left greater than right. No pneumothorax. No acute osseous abnormality. IMPRESSION: 1. Mild pulmonary edema with persistent bilateral at least trace volume pleural effusions, left greater than right. 2. ASsociatedbilateral lower lobe hazy airspace opacities that could represent a combination of atelectasis versus infection/inflammation. Electronically Signed   By: Iven Finn M.D.   On: 09/01/2020 18:47   IR Fluoro Guide CV Line Right  Result Date: 09/04/2020 INDICATION: 70 year old male with end-stage renal disease and malfunctioning left upper extremity fistula. EXAM: TUNNELED CENTRAL VENOUS HEMODIALYSIS CATHETER PLACEMENT WITH ULTRASOUND AND FLUOROSCOPIC GUIDANCE MEDICATIONS: Ancef 2 gm IV . The antibiotic was given in an appropriate time interval prior to skin puncture. ANESTHESIA/SEDATION: Moderate (conscious) sedation was employed during this procedure. A total of Versed 1 mg and Fentanyl 25 mcg was administered intravenously. Moderate Sedation Time: 15 minutes. The patient's level of consciousness and vital signs were monitored continuously by radiology nursing throughout the procedure under my direct supervision. FLUOROSCOPY TIME:  0 minutes 12 seconds (7 mGy). COMPLICATIONS: None immediate. PROCEDURE: Informed written consent was obtained from the patient after a discussion of the risks, benefits, and alternatives to treatment. Questions regarding the  procedure were encouraged and answered. The right neck and chest were prepped with chlorhexidine in a sterile fashion, and a sterile drape was applied covering the operative field. Maximum barrier sterile technique with sterile gowns and gloves were used for the procedure. A timeout was performed prior to the initiation of the procedure. After creating a small venotomy incision, a 21 gauge micropuncture kit was utilized to access the internal jugular vein. Real-time ultrasound guidance was utilized for vascular access including the acquisition of a permanent ultrasound image documenting patency of  the accessed vessel. A Rosen wire was advanced to the level of the IVC and the micropuncture sheath was exchanged for an 8 Fr dilator. A 14.5 French tunneled hemodialysis catheter measuring 23 cm from tip to cuff was tunneled in a retrograde fashion from the anterior chest wall to the venotomy incision. Serial dilation was then performed an a peel-away sheath was placed. The catheter was then placed through the peel-away sheath with the catheter tip ultimately positioned within the right atrium. Final catheter positioning was confirmed and documented with a spot radiographic image. The catheter aspirates and flushes normally. The catheter was flushed with appropriate volume heparin dwells. The catheter exit site was secured with a 0-Silk retention suture. The venotomy incision was closed with Dermabond. Sterile dressings were applied. The patient tolerated the procedure well without immediate post procedural complication. IMPRESSION: Successful placement of 23 cm tip to cuff tunneled hemodialysis catheter via the right internal jugular vein with catheter tip terminating within the right atrium. The catheter is ready for immediate use. Ruthann Cancer, MD [facility] Radiology Electronically Signed   By: Ruthann Cancer MD   On: 09/04/2020 15:01   IR US Guide Vasc Access  Right  Result Date: 09/04/2020 INDICATION: 70 year old male with end-stage renal disease and malfunctioning left upper extremity fistula. EXAM: TUNNELED CENTRAL VENOUS HEMODIALYSIS CATHETER PLACEMENT WITH ULTRASOUND AND FLUOROSCOPIC GUIDANCE MEDICATIONS: Ancef 2 gm IV . The antibiotic was given in an appropriate time interval prior to skin puncture. ANESTHESIA/SEDATION: Moderate (conscious) sedation was employed during this procedure. A total of Versed 1 mg and Fentanyl 25 mcg was administered intravenously. Moderate Sedation Time: 15 minutes. The patient's level of consciousness and vital signs were monitored continuously by radiology nursing throughout the procedure under my direct supervision. FLUOROSCOPY TIME:  0 minutes 12 seconds (7 mGy). COMPLICATIONS: None immediate. PROCEDURE: Informed written consent was obtained from the patient after a discussion of the risks, benefits, and alternatives to treatment. Questions regarding the procedure were encouraged and answered. The right neck and chest were prepped with chlorhexidine in a sterile fashion, and a sterile drape was applied covering the operative field. Maximum barrier sterile technique with sterile gowns and gloves were used for the procedure. A timeout was performed prior to the initiation of the procedure. After creating a small venotomy incision, a 21 gauge micropuncture kit was utilized to access the internal jugular vein. Real-time ultrasound guidance was utilized for vascular access including the acquisition of a permanent ultrasound image documenting patency of the accessed vessel. A Rosen wire was advanced to the level of the IVC and the micropuncture sheath was exchanged for an 8 Fr dilator. A 14.5 French tunneled hemodialysis catheter measuring 23 cm from tip to cuff was tunneled in a retrograde fashion from the anterior chest wall to the venotomy incision. Serial dilation was then performed an a peel-away sheath was placed. The catheter was  then placed through the peel-away sheath with the catheter tip ultimately positioned within the right atrium. Final catheter positioning was confirmed and documented with a spot radiographic image. The catheter aspirates and flushes normally. The catheter was flushed with appropriate volume heparin dwells. The catheter exit site was secured with a 0-Silk retention suture. The venotomy incision was closed with Dermabond. Sterile dressings were applied. The patient tolerated the procedure well without immediate post procedural complication. IMPRESSION: Successful placement of 23 cm tip to cuff tunneled hemodialysis catheter via the right internal jugular vein with catheter tip terminating within the right  atrium. The catheter is ready for immediate use. Ruthann Cancer, MD [facility] Radiology Electronically Signed   By: Ruthann Cancer MD   On: 09/04/2020 15:01   DG Foot Complete Right  Result Date: 08/23/2020 Please see detailed radiograph report in office note.  VAS [place] LOWER EXTREMITY VENOUS (DVT)  Result Date: 09/03/2020  Lower Venous DVT Study Patient Name:  AKIEL FENNELL [facility]  Date of Exam:   09/02/2020 Medical Rec #: [id number]            Accession #:    [id number] Date of Birth: 08-13-1950            Patient Gender: M Patient Age:   70Y Exam Location:  [facility] Procedure:      VAS [place] LOWER EXTREMITY VENOUS (DVT) Referring Phys: [id number] [facility] MEMON --------------------------------------------------------------------------------  Indications: Weeping edema., and SOB.  Risk Factors: PE 12/16/19 CKD V, CAD, CHF, pulmonary vascular hypertension, anasarca. Limitations: Significant pitting edema throughout, open, bleeding sores, and pain in calves. Comparison Study: Negative bilateral lower extremity duplex done 12/22/19 Performing Technologist: Sharion Dove RVS  Examination Guidelines: A complete evaluation includes B-mode imaging, spectral Doppler,  color Doppler, and power Doppler as needed of all accessible portions of each vessel. Bilateral testing is considered an integral part of a complete examination. Limited examinations for reoccurring indications may be performed as noted. The reflux portion of the exam is performed with the patient in reverse Trendelenburg.  +---------+---------------+---------+-----------+----------+--------------+ RIGHT    CompressibilityPhasicitySpontaneityPropertiesThrombus Aging +---------+---------------+---------+-----------+----------+--------------+ CFV      Full           Yes      Yes                                 +---------+---------------+---------+-----------+----------+--------------+ SFJ      Full                                                        +---------+---------------+---------+-----------+----------+--------------+ FV Prox  Full                                                        +---------+---------------+---------+-----------+----------+--------------+ FV Mid   Full                                                        +---------+---------------+---------+-----------+----------+--------------+ FV DistalFull                                                        +---------+---------------+---------+-----------+----------+--------------+ PFV      Full                                                        +---------+---------------+---------+-----------+----------+--------------+  POP      Full           Yes      Yes                                 +---------+---------------+---------+-----------+----------+--------------+ PTV      Full                                                        +---------+---------------+---------+-----------+----------+--------------+ PERO     Full                                                        +---------+---------------+---------+-----------+----------+--------------+    +---------+---------------+---------+-----------+----------+-------------------+ LEFT     CompressibilityPhasicitySpontaneityPropertiesThrombus Aging      +---------+---------------+---------+-----------+----------+-------------------+ CFV      Full           Yes      Yes                                      +---------+---------------+---------+-----------+----------+-------------------+ SFJ      Full                                                             +---------+---------------+---------+-----------+----------+-------------------+ FV Prox  Full                                                             +---------+---------------+---------+-----------+----------+-------------------+ FV Mid                  Yes      Yes                  patent by color and                                                       Doppler             +---------+---------------+---------+-----------+----------+-------------------+ FV Distal               Yes      Yes                  patent by color and  Doppler             +---------+---------------+---------+-----------+----------+-------------------+ POP      Full           Yes      Yes                                      +---------+---------------+---------+-----------+----------+-------------------+ PTV                                                   Not well visualized +---------+---------------+---------+-----------+----------+-------------------+ PERO                                                  Not well visualized +---------+---------------+---------+-----------+----------+-------------------+     Summary: RIGHT: - There is no evidence of deep vein thrombosis in the lower extremity. However, portions of this examination were limited- see technologist comments above.  LEFT: - There is no evidence of deep vein thrombosis in the lower  extremity. However, portions of this examination were limited- see technologist comments above.  *See table(s) above for measurements and observations. Electronically signed by Jamelle Haring on 09/03/2020 at 9:20:49 AM.    Final      Discharge Exam: Vitals:   09/08/20 0930 09/08/20 1000  BP: (!) 120/54 134/64  Pulse: 71 69  Resp:    Temp:    SpO2:     Vitals:   09/08/20 0830 09/08/20 0900 09/08/20 0930 09/08/20 1000  BP: (!) 127/54 (!) 134/56 (!) 120/54 134/64  Pulse: 68 71 71 69  Resp:      Temp:      TempSrc:      SpO2:      Weight:      Height:        General: Pt is alert, awake, not in acute distress Cardiovascular: RRR, S1/S2 +, no rubs, no gallops Respiratory: CTA bilaterally, no wheezing, no rhonchi Abdominal: Soft, NT, ND, bowel sounds + Extremities: +3 pitting edema bilateral upper and lower extremities., no cyanosis    The results of significant diagnostics from this hospitalization (including imaging, microbiology, ancillary and laboratory) are listed below for reference.     Microbiology: Recent Results (from the past 240 hour(s))  Resp Panel by RT-PCR (Flu A&B, Covid) Nasopharyngeal Swab     Status: None   Collection Time: 09/01/20  9:06 PM   Specimen: Nasopharyngeal Swab; Nasopharyngeal(NP) swabs in vial transport medium  Result Value Ref Range Status   SARS Coronavirus 2 by RT PCR NEGATIVE NEGATIVE Final    Comment: (NOTE) SARS-CoV-2 target nucleic acids are NOT DETECTED.  The SARS-CoV-2 RNA is generally detectable in upper respiratory specimens during the acute phase of infection. The lowest concentration of SARS-CoV-2 viral copies this assay can detect is 138 copies/mL. A negative result does not preclude SARS-Cov-2 infection and should not be used as the sole basis for treatment or other patient management decisions. A negative result may occur with  improper specimen collection/handling, submission of specimen other than nasopharyngeal swab,  presence of viral mutation(s) within the areas targeted by this assay, and inadequate number of viral copies(<138 copies/mL). A negative result  must be combined with clinical observations, patient history, and epidemiological information. The expected result is Negative.  Fact Sheet for Patients:  BloggerCourse.com  Fact Sheet for Healthcare Providers:  SeriousBroker.it  This test is no t yet approved or cleared by the [place] FDA and  has been authorized for detection and/or diagnosis of SARS-CoV-2 by FDA under an Emergency Use Authorization (EUA). This EUA will remain  in effect (meaning this test can be used) for the duration of the COVID-19 declaration under Section 564(b)(1) of the Act, 21 U.S.C.section 360bbb-3(b)(1), unless the authorization is terminated  or revoked sooner.       Influenza A by PCR NEGATIVE NEGATIVE Final   Influenza B by PCR NEGATIVE NEGATIVE Final    Comment: (NOTE) The Xpert Xpress SARS-CoV-2/FLU/RSV plus assay is intended as an aid in the diagnosis of influenza from Nasopharyngeal swab specimens and should not be used as a sole basis for treatment. Nasal washings and aspirates are unacceptable for Xpert Xpress SARS-CoV-2/FLU/RSV testing.  Fact Sheet for Patients: BloggerCourse.com  Fact Sheet for Healthcare Providers: SeriousBroker.it  This test is not yet approved or cleared by the [place] FDA and has been authorized for detection and/or diagnosis of SARS-CoV-2 by FDA under an Emergency Use Authorization (EUA). This EUA will remain in effect (meaning this test can be used) for the duration of the COVID-19 declaration under Section 564(b)(1) of the Act, 21 U.S.C. section 360bbb-3(b)(1), unless the authorization is terminated or revoked.  Performed at [facility], [address] [place], [place] [address]      Labs: BNP  (last 3 results) Recent Labs    09/01/20 1820  BNP 1,761.4*   Basic Metabolic Panel: Recent Labs  Lab 09/03/20 0116 09/04/20 0127 09/05/20 0133 09/06/20 0105 09/07/20 1827 09/08/20 0736  NA 131* 130* 131* 131*  --  132*  K 3.6 3.6 3.9 4.2  --  3.5  CL 99 99 97* 98  --  96*  CO2 18* 18* 20* 20*  --  23  GLUCOSE 170* 184* 203* 199*  --  151*  BUN 122* 128* 127* 110*  --  75*  CREATININE 6.10* 6.44* 6.61* 6.03*  --  5.22*  CALCIUM 7.8* 8.0* 8.0* 8.0*  --  8.4*  PHOS 8.3* 8.4* 8.4* 6.7* 4.7* 4.6   Liver Function Tests: Recent Labs  Lab 09/01/20 1819 09/03/20 0116 09/04/20 0127 09/05/20 0133 09/06/20 0105 09/08/20 0736  AST 17  --   --   --   --   --   ALT 15  --   --   --   --   --   ALKPHOS 55  --   --   --   --   --   BILITOT 0.8  --   --   --   --   --   PROT 6.0*  --   --   --   --   --   ALBUMIN 2.5* 2.3* 2.3* 2.4* 2.4* 2.3*   No results for input(s): LIPASE, AMYLASE in the last 168 hours. No results for input(s): AMMONIA in the last 168 hours. CBC: Recent Labs  Lab 09/01/20 2140 09/02/20 0012 09/03/20 0116 09/03/20 1447 09/04/20 0127 09/05/20 0133 09/06/20 0105 09/08/20 0736  WBC 8.5   < > 7.4  --  7.8 9.0 9.6 9.2  NEUTROABS 6.7  --   --   --   --   --   --   --  HGB 8.1*   < > 6.7* 7.9* 7.3* 7.7* 7.7* 8.2*  HCT 27.6*   < > 22.9* 26.3* 25.1* 26.9* 26.1* 28.4*  MCV 79.1*   < > 78.4*  --  79.7* 81.3 79.6* 83.0  PLT 252   < > 209  --  228 229 220 214   < > = values in this interval not displayed.   Cardiac Enzymes: No results for input(s): CKTOTAL, CKMB, CKMBINDEX, TROPONINI in the last 168 hours. BNP: Invalid input(s): POCBNP CBG: Recent Labs  Lab 09/07/20 0643 09/07/20 1206 09/07/20 1655 09/07/20 2058 09/08/20 0621  GLUCAP 112* 123* 145* 147* 118*   D-Dimer No results for input(s): DDIMER in the last 72 hours. Hgb A1c No results for input(s): HGBA1C in the last 72 hours. Lipid Profile No results for input(s): CHOL, HDL, LDLCALC, TRIG,  CHOLHDL, LDLDIRECT in the last 72 hours. Thyroid function studies No results for input(s): TSH, T4TOTAL, T3FREE, THYROIDAB in the last 72 hours.  Invalid input(s): FREET3 Anemia work up No results for input(s): VITAMINB12, FOLATE, FERRITIN, TIBC, IRON, RETICCTPCT in the last 72 hours. Urinalysis    Component Value Date/Time   COLORURINE YELLOW 03/10/2017 1553   APPEARANCEUR HAZY (A) 03/10/2017 1553   LABSPEC 1.013 03/10/2017 1553   PHURINE 5.0 03/10/2017 1553   GLUCOSEU NEGATIVE 03/10/2017 1553   HGBUR NEGATIVE 03/10/2017 1553   BILIRUBINUR NEGATIVE 03/10/2017 1553   KETONESUR NEGATIVE 03/10/2017 1553   PROTEINUR 30 (A) 03/10/2017 1553   NITRITE NEGATIVE 03/10/2017 1553   LEUKOCYTESUR NEGATIVE 03/10/2017 1553   Sepsis Labs Invalid input(s): PROCALCITONIN,  WBC,  LACTICIDVEN Microbiology Recent Results (from the past 240 hour(s))  Resp Panel by RT-PCR (Flu A&B, Covid) Nasopharyngeal Swab     Status: None   Collection Time: 09/01/20  9:06 PM   Specimen: Nasopharyngeal Swab; Nasopharyngeal(NP) swabs in vial transport medium  Result Value Ref Range Status   SARS Coronavirus 2 by RT PCR NEGATIVE NEGATIVE Final    Comment: (NOTE) SARS-CoV-2 target nucleic acids are NOT DETECTED.  The SARS-CoV-2 RNA is generally detectable in upper respiratory specimens during the acute phase of infection. The lowest concentration of SARS-CoV-2 viral copies this assay can detect is 138 copies/mL. A negative result does not preclude SARS-Cov-2 infection and should not be used as the sole basis for treatment or other patient management decisions. A negative result may occur with  improper specimen collection/handling, submission of specimen other than nasopharyngeal swab, presence of viral mutation(s) within the areas targeted by this assay, and inadequate number of viral copies(<138 copies/mL). A negative result must be combined with clinical observations, patient history, and  epidemiological information. The expected result is Negative.  Fact Sheet for Patients:  EntrepreneurPulse.com.au  Fact Sheet for Healthcare Providers:  IncredibleEmployment.be  This test is no t yet approved or cleared by the [place] FDA and  has been authorized for detection and/or diagnosis of SARS-CoV-2 by FDA under an Emergency Use Authorization (EUA). This EUA will remain  in effect (meaning this test can be used) for the duration of the COVID-19 declaration under Section 564(b)(1) of the Act, 21 U.S.C.section 360bbb-3(b)(1), unless the authorization is terminated  or revoked sooner.       Influenza A by PCR NEGATIVE NEGATIVE Final   Influenza B by PCR NEGATIVE NEGATIVE Final    Comment: (NOTE) The Xpert Xpress SARS-CoV-2/FLU/RSV plus assay is intended as an aid in the diagnosis of influenza from Nasopharyngeal swab specimens and should not be used as a sole  basis for treatment. Nasal washings and aspirates are unacceptable for Xpert Xpress SARS-CoV-2/FLU/RSV testing.  Fact Sheet for Patients: EntrepreneurPulse.com.au  Fact Sheet for Healthcare Providers: IncredibleEmployment.be  This test is not yet approved or cleared by the [place] FDA and has been authorized for detection and/or diagnosis of SARS-CoV-2 by FDA under an Emergency Use Authorization (EUA). This EUA will remain in effect (meaning this test can be used) for the duration of the COVID-19 declaration under Section 564(b)(1) of the Act, 21 U.S.C. section 360bbb-3(b)(1), unless the authorization is terminated or revoked.  Performed at [facility], [place] [address], [place],  [address]      Time coordinating discharge: Over 30 minutes  SIGNED:   Darliss Cheney, MD  [facility] 09/08/2020, 10:07 AM  If 7PM-7AM, please contact night-coverage [email or web address]

## 2020-09-08 NOTE — TOC Transition Note (Signed)
Transition of Care [facility]) - CM/SW Discharge Note   Patient Details  Name: Travis Blair MRN: [id number] Date of Birth: 1950/03/21  [facility]) CM/SW Contact:  Tom-Johnson, Renea Ee, RN Phone Number: 09/08/2020, 3:55 PM   Clinical Narrative:    Case Manager spoke with both patient and spouse about home health services, List form Medicare.Gov given to them to choose from. Both agreed to choose [facility]. Call made to [facility] [phone]) and services approved. Agency requested a nurse be added due to patient's weeping legs and HX of CHF. Request carried out. Ramond Marrow made aware of patient's dialysis days so they will not schedule services on those days per spouse's request. [employer] notified for rolling walker and was delivered to his room.   Final next level of care: [place] Barriers to Discharge: No Barriers Identified   Patient Goals and CMS Choice Patient states their goals for this hospitalization and ongoing recovery are:: To go home CMS Medicare.gov Compare Post Acute Care list provided to:: Patient Choice offered to / list presented to : Patient, Spouse  Discharge Placement                Patient to be transferred to facility by: NA      Discharge Plan and Services In-house Referral: NA Discharge Planning Services: CM Consult Post Acute Care Choice: Home Health          DME Arranged: Walker rolling DME Agency: [employer] Date DME Agency Contacted: 09/08/20 Time DME Agency Contacted: 1400 Representative spoke with at DME Agency: Freda Munro HH Arranged: RN, PT, OT [facility] Agency: [place] ([place]) Date [place]: 09/08/20 Time [place]: [phone] Representative spoke with at [place]: [place] ([place]) Interventions     Readmission Risk Interventions No flowsheet data found.

## 2020-09-09 ENCOUNTER — Telehealth (HOSPITAL_COMMUNITY): Payer: Self-pay | Admitting: Nephrology

## 2020-09-09 DIAGNOSIS — E039 Hypothyroidism, unspecified: Secondary | ICD-10-CM | POA: Insufficient documentation

## 2020-09-09 DIAGNOSIS — D689 Coagulation defect, unspecified: Secondary | ICD-10-CM | POA: Diagnosis not present

## 2020-09-09 DIAGNOSIS — Z23 Encounter for immunization: Secondary | ICD-10-CM | POA: Diagnosis not present

## 2020-09-09 DIAGNOSIS — N2581 Secondary hyperparathyroidism of renal origin: Secondary | ICD-10-CM | POA: Diagnosis not present

## 2020-09-09 DIAGNOSIS — Z992 Dependence on renal dialysis: Secondary | ICD-10-CM | POA: Diagnosis not present

## 2020-09-09 DIAGNOSIS — N186 End stage renal disease: Secondary | ICD-10-CM | POA: Diagnosis not present

## 2020-09-09 NOTE — Telephone Encounter (Signed)
Transition of care contact from inpatient facility  Date of Discharge: 09/08/20 Date of Contact:09/09/20 - attempted Method of contact: Phone  Attempted to contact patient to discuss transition of care from inpatient admission. Patient did not answer the phone. Message was left on the patient's voicemail with call back number [phone].  Veneta Penton, PA-C [employer] Pager [phone]

## 2020-09-11 ENCOUNTER — Encounter: Payer: Self-pay | Admitting: Family Medicine

## 2020-09-11 ENCOUNTER — Telehealth: Payer: Self-pay

## 2020-09-11 DIAGNOSIS — S90921D Unspecified superficial injury of right foot, subsequent encounter: Secondary | ICD-10-CM | POA: Diagnosis not present

## 2020-09-11 DIAGNOSIS — I872 Venous insufficiency (chronic) (peripheral): Secondary | ICD-10-CM | POA: Diagnosis not present

## 2020-09-11 DIAGNOSIS — L97829 Non-pressure chronic ulcer of other part of left lower leg with unspecified severity: Secondary | ICD-10-CM | POA: Diagnosis not present

## 2020-09-11 DIAGNOSIS — Z86711 Personal history of pulmonary embolism: Secondary | ICD-10-CM | POA: Diagnosis not present

## 2020-09-11 DIAGNOSIS — E669 Obesity, unspecified: Secondary | ICD-10-CM | POA: Diagnosis not present

## 2020-09-11 DIAGNOSIS — I4891 Unspecified atrial fibrillation: Secondary | ICD-10-CM | POA: Diagnosis not present

## 2020-09-11 DIAGNOSIS — E785 Hyperlipidemia, unspecified: Secondary | ICD-10-CM | POA: Diagnosis not present

## 2020-09-11 DIAGNOSIS — G4733 Obstructive sleep apnea (adult) (pediatric): Secondary | ICD-10-CM | POA: Diagnosis not present

## 2020-09-11 DIAGNOSIS — Z79899 Other long term (current) drug therapy: Secondary | ICD-10-CM | POA: Diagnosis not present

## 2020-09-11 DIAGNOSIS — I252 Old myocardial infarction: Secondary | ICD-10-CM | POA: Diagnosis not present

## 2020-09-11 DIAGNOSIS — Z951 Presence of aortocoronary bypass graft: Secondary | ICD-10-CM | POA: Diagnosis not present

## 2020-09-11 DIAGNOSIS — E1151 Type 2 diabetes mellitus with diabetic peripheral angiopathy without gangrene: Secondary | ICD-10-CM | POA: Diagnosis not present

## 2020-09-11 DIAGNOSIS — I5033 Acute on chronic diastolic (congestive) heart failure: Secondary | ICD-10-CM | POA: Diagnosis not present

## 2020-09-11 DIAGNOSIS — I132 Hypertensive heart and chronic kidney disease with heart failure and with stage 5 chronic kidney disease, or end stage renal disease: Secondary | ICD-10-CM | POA: Diagnosis not present

## 2020-09-11 DIAGNOSIS — Z992 Dependence on renal dialysis: Secondary | ICD-10-CM | POA: Diagnosis not present

## 2020-09-11 DIAGNOSIS — Z955 Presence of coronary angioplasty implant and graft: Secondary | ICD-10-CM | POA: Diagnosis not present

## 2020-09-11 DIAGNOSIS — E1122 Type 2 diabetes mellitus with diabetic chronic kidney disease: Secondary | ICD-10-CM | POA: Diagnosis not present

## 2020-09-11 DIAGNOSIS — D631 Anemia in chronic kidney disease: Secondary | ICD-10-CM | POA: Diagnosis not present

## 2020-09-11 DIAGNOSIS — N186 End stage renal disease: Secondary | ICD-10-CM | POA: Diagnosis not present

## 2020-09-11 DIAGNOSIS — I2510 Atherosclerotic heart disease of native coronary artery without angina pectoris: Secondary | ICD-10-CM | POA: Diagnosis not present

## 2020-09-11 DIAGNOSIS — Z6834 Body mass index (BMI) 34.0-34.9, adult: Secondary | ICD-10-CM | POA: Diagnosis not present

## 2020-09-11 DIAGNOSIS — Z86718 Personal history of other venous thrombosis and embolism: Secondary | ICD-10-CM | POA: Diagnosis not present

## 2020-09-11 DIAGNOSIS — I272 Pulmonary hypertension, unspecified: Secondary | ICD-10-CM | POA: Diagnosis not present

## 2020-09-11 DIAGNOSIS — L97819 Non-pressure chronic ulcer of other part of right lower leg with unspecified severity: Secondary | ICD-10-CM | POA: Diagnosis not present

## 2020-09-11 DIAGNOSIS — K5900 Constipation, unspecified: Secondary | ICD-10-CM | POA: Diagnosis not present

## 2020-09-11 NOTE — Telephone Encounter (Signed)
Travis Blair with [facility] called in to verify home health orders for this pt. She stated that this is a new process being implemented for every pt that is seen via home health visits. She asked that the nurse/and or dr to please give a call back during this visit or she will have to give a call back to office to verify orders before the end of her home visit.  Please advise.  Please call back Travis Blair with [facility]#: [phone]

## 2020-09-11 NOTE — Telephone Encounter (Signed)
Received return call from [place], [facility].   Requested VO for [facility] SN services 2x weekly x2 weeks, then 1x weekly x6 weeks for medication management and disease education.  Also requested order for (4) layer compression dressing to BLE for edema R/T CHF. States that patient also has open sounds to BLE, and requested VO to apply Xeroform and betadine dressings and to be cahnges 2x weekly.   Also requested VO for daily weights with directions to call provider for >3 lb weight gain in 24 hours, or >5 lb weight gain in 7 days.  VO's given.

## 2020-09-11 NOTE — Telephone Encounter (Signed)
Call placed to [place].   No answer, no VM.

## 2020-09-12 DIAGNOSIS — D689 Coagulation defect, unspecified: Secondary | ICD-10-CM | POA: Diagnosis not present

## 2020-09-12 DIAGNOSIS — Z992 Dependence on renal dialysis: Secondary | ICD-10-CM | POA: Diagnosis not present

## 2020-09-12 DIAGNOSIS — Z23 Encounter for immunization: Secondary | ICD-10-CM | POA: Diagnosis not present

## 2020-09-12 DIAGNOSIS — D631 Anemia in chronic kidney disease: Secondary | ICD-10-CM | POA: Diagnosis not present

## 2020-09-12 DIAGNOSIS — N186 End stage renal disease: Secondary | ICD-10-CM | POA: Diagnosis not present

## 2020-09-12 DIAGNOSIS — E119 Type 2 diabetes mellitus without complications: Secondary | ICD-10-CM | POA: Diagnosis not present

## 2020-09-12 DIAGNOSIS — D509 Iron deficiency anemia, unspecified: Secondary | ICD-10-CM | POA: Diagnosis not present

## 2020-09-12 DIAGNOSIS — N2581 Secondary hyperparathyroidism of renal origin: Secondary | ICD-10-CM | POA: Diagnosis not present

## 2020-09-13 DIAGNOSIS — I872 Venous insufficiency (chronic) (peripheral): Secondary | ICD-10-CM | POA: Diagnosis not present

## 2020-09-13 DIAGNOSIS — D631 Anemia in chronic kidney disease: Secondary | ICD-10-CM | POA: Diagnosis not present

## 2020-09-13 DIAGNOSIS — Z955 Presence of coronary angioplasty implant and graft: Secondary | ICD-10-CM | POA: Diagnosis not present

## 2020-09-13 DIAGNOSIS — L97829 Non-pressure chronic ulcer of other part of left lower leg with unspecified severity: Secondary | ICD-10-CM | POA: Diagnosis not present

## 2020-09-13 DIAGNOSIS — Z86711 Personal history of pulmonary embolism: Secondary | ICD-10-CM | POA: Diagnosis not present

## 2020-09-13 DIAGNOSIS — Z951 Presence of aortocoronary bypass graft: Secondary | ICD-10-CM | POA: Diagnosis not present

## 2020-09-13 DIAGNOSIS — E785 Hyperlipidemia, unspecified: Secondary | ICD-10-CM | POA: Diagnosis not present

## 2020-09-13 DIAGNOSIS — S90921D Unspecified superficial injury of right foot, subsequent encounter: Secondary | ICD-10-CM | POA: Diagnosis not present

## 2020-09-13 DIAGNOSIS — K5900 Constipation, unspecified: Secondary | ICD-10-CM | POA: Diagnosis not present

## 2020-09-13 DIAGNOSIS — G4733 Obstructive sleep apnea (adult) (pediatric): Secondary | ICD-10-CM | POA: Diagnosis not present

## 2020-09-13 DIAGNOSIS — N186 End stage renal disease: Secondary | ICD-10-CM | POA: Diagnosis not present

## 2020-09-13 DIAGNOSIS — E1151 Type 2 diabetes mellitus with diabetic peripheral angiopathy without gangrene: Secondary | ICD-10-CM | POA: Diagnosis not present

## 2020-09-13 DIAGNOSIS — L97819 Non-pressure chronic ulcer of other part of right lower leg with unspecified severity: Secondary | ICD-10-CM | POA: Diagnosis not present

## 2020-09-13 DIAGNOSIS — Z6834 Body mass index (BMI) 34.0-34.9, adult: Secondary | ICD-10-CM | POA: Diagnosis not present

## 2020-09-13 DIAGNOSIS — I252 Old myocardial infarction: Secondary | ICD-10-CM | POA: Diagnosis not present

## 2020-09-13 DIAGNOSIS — I272 Pulmonary hypertension, unspecified: Secondary | ICD-10-CM | POA: Diagnosis not present

## 2020-09-13 DIAGNOSIS — Z992 Dependence on renal dialysis: Secondary | ICD-10-CM | POA: Diagnosis not present

## 2020-09-13 DIAGNOSIS — E1122 Type 2 diabetes mellitus with diabetic chronic kidney disease: Secondary | ICD-10-CM | POA: Diagnosis not present

## 2020-09-13 DIAGNOSIS — E669 Obesity, unspecified: Secondary | ICD-10-CM | POA: Diagnosis not present

## 2020-09-13 DIAGNOSIS — I5033 Acute on chronic diastolic (congestive) heart failure: Secondary | ICD-10-CM | POA: Diagnosis not present

## 2020-09-13 DIAGNOSIS — Z79899 Other long term (current) drug therapy: Secondary | ICD-10-CM | POA: Diagnosis not present

## 2020-09-13 DIAGNOSIS — Z86718 Personal history of other venous thrombosis and embolism: Secondary | ICD-10-CM | POA: Diagnosis not present

## 2020-09-13 DIAGNOSIS — I132 Hypertensive heart and chronic kidney disease with heart failure and with stage 5 chronic kidney disease, or end stage renal disease: Secondary | ICD-10-CM | POA: Diagnosis not present

## 2020-09-13 DIAGNOSIS — I2510 Atherosclerotic heart disease of native coronary artery without angina pectoris: Secondary | ICD-10-CM | POA: Diagnosis not present

## 2020-09-13 DIAGNOSIS — I4891 Unspecified atrial fibrillation: Secondary | ICD-10-CM | POA: Diagnosis not present

## 2020-09-14 DIAGNOSIS — E46 Unspecified protein-calorie malnutrition: Secondary | ICD-10-CM | POA: Insufficient documentation

## 2020-09-14 DIAGNOSIS — G4733 Obstructive sleep apnea (adult) (pediatric): Secondary | ICD-10-CM | POA: Diagnosis not present

## 2020-09-18 DIAGNOSIS — S90921D Unspecified superficial injury of right foot, subsequent encounter: Secondary | ICD-10-CM | POA: Diagnosis not present

## 2020-09-18 DIAGNOSIS — I132 Hypertensive heart and chronic kidney disease with heart failure and with stage 5 chronic kidney disease, or end stage renal disease: Secondary | ICD-10-CM | POA: Diagnosis not present

## 2020-09-18 DIAGNOSIS — K5900 Constipation, unspecified: Secondary | ICD-10-CM | POA: Diagnosis not present

## 2020-09-18 DIAGNOSIS — Z6834 Body mass index (BMI) 34.0-34.9, adult: Secondary | ICD-10-CM | POA: Diagnosis not present

## 2020-09-18 DIAGNOSIS — G4733 Obstructive sleep apnea (adult) (pediatric): Secondary | ICD-10-CM | POA: Diagnosis not present

## 2020-09-18 DIAGNOSIS — I5033 Acute on chronic diastolic (congestive) heart failure: Secondary | ICD-10-CM | POA: Diagnosis not present

## 2020-09-18 DIAGNOSIS — Z79899 Other long term (current) drug therapy: Secondary | ICD-10-CM | POA: Diagnosis not present

## 2020-09-18 DIAGNOSIS — I272 Pulmonary hypertension, unspecified: Secondary | ICD-10-CM | POA: Diagnosis not present

## 2020-09-18 DIAGNOSIS — N186 End stage renal disease: Secondary | ICD-10-CM | POA: Diagnosis not present

## 2020-09-18 DIAGNOSIS — D631 Anemia in chronic kidney disease: Secondary | ICD-10-CM | POA: Diagnosis not present

## 2020-09-18 DIAGNOSIS — E1122 Type 2 diabetes mellitus with diabetic chronic kidney disease: Secondary | ICD-10-CM | POA: Diagnosis not present

## 2020-09-18 DIAGNOSIS — Z951 Presence of aortocoronary bypass graft: Secondary | ICD-10-CM | POA: Diagnosis not present

## 2020-09-18 DIAGNOSIS — L97829 Non-pressure chronic ulcer of other part of left lower leg with unspecified severity: Secondary | ICD-10-CM | POA: Diagnosis not present

## 2020-09-18 DIAGNOSIS — L97819 Non-pressure chronic ulcer of other part of right lower leg with unspecified severity: Secondary | ICD-10-CM | POA: Diagnosis not present

## 2020-09-18 DIAGNOSIS — I2510 Atherosclerotic heart disease of native coronary artery without angina pectoris: Secondary | ICD-10-CM | POA: Diagnosis not present

## 2020-09-18 DIAGNOSIS — Z992 Dependence on renal dialysis: Secondary | ICD-10-CM | POA: Diagnosis not present

## 2020-09-18 DIAGNOSIS — Z86718 Personal history of other venous thrombosis and embolism: Secondary | ICD-10-CM | POA: Diagnosis not present

## 2020-09-18 DIAGNOSIS — E669 Obesity, unspecified: Secondary | ICD-10-CM | POA: Diagnosis not present

## 2020-09-18 DIAGNOSIS — E785 Hyperlipidemia, unspecified: Secondary | ICD-10-CM | POA: Diagnosis not present

## 2020-09-18 DIAGNOSIS — I872 Venous insufficiency (chronic) (peripheral): Secondary | ICD-10-CM | POA: Diagnosis not present

## 2020-09-18 DIAGNOSIS — E1151 Type 2 diabetes mellitus with diabetic peripheral angiopathy without gangrene: Secondary | ICD-10-CM | POA: Diagnosis not present

## 2020-09-18 DIAGNOSIS — Z86711 Personal history of pulmonary embolism: Secondary | ICD-10-CM | POA: Diagnosis not present

## 2020-09-18 DIAGNOSIS — Z955 Presence of coronary angioplasty implant and graft: Secondary | ICD-10-CM | POA: Diagnosis not present

## 2020-09-18 DIAGNOSIS — I4891 Unspecified atrial fibrillation: Secondary | ICD-10-CM | POA: Diagnosis not present

## 2020-09-18 DIAGNOSIS — I252 Old myocardial infarction: Secondary | ICD-10-CM | POA: Diagnosis not present

## 2020-09-19 ENCOUNTER — Ambulatory Visit: Payer: PPO | Admitting: Podiatry

## 2020-09-19 ENCOUNTER — Other Ambulatory Visit: Payer: Self-pay

## 2020-09-19 VITALS — Temp 99.3°F

## 2020-09-19 DIAGNOSIS — D689 Coagulation defect, unspecified: Secondary | ICD-10-CM | POA: Diagnosis not present

## 2020-09-19 DIAGNOSIS — I83892 Varicose veins of left lower extremities with other complications: Secondary | ICD-10-CM | POA: Diagnosis not present

## 2020-09-19 DIAGNOSIS — L97511 Non-pressure chronic ulcer of other part of right foot limited to breakdown of skin: Secondary | ICD-10-CM | POA: Diagnosis not present

## 2020-09-19 DIAGNOSIS — I83019 Varicose veins of right lower extremity with ulcer of unspecified site: Secondary | ICD-10-CM | POA: Diagnosis not present

## 2020-09-19 DIAGNOSIS — N2581 Secondary hyperparathyroidism of renal origin: Secondary | ICD-10-CM | POA: Diagnosis not present

## 2020-09-19 DIAGNOSIS — L97929 Non-pressure chronic ulcer of unspecified part of left lower leg with unspecified severity: Secondary | ICD-10-CM

## 2020-09-19 DIAGNOSIS — R609 Edema, unspecified: Secondary | ICD-10-CM | POA: Diagnosis not present

## 2020-09-19 DIAGNOSIS — I83891 Varicose veins of right lower extremities with other complications: Secondary | ICD-10-CM

## 2020-09-19 DIAGNOSIS — I83029 Varicose veins of left lower extremity with ulcer of unspecified site: Secondary | ICD-10-CM

## 2020-09-19 DIAGNOSIS — L97919 Non-pressure chronic ulcer of unspecified part of right lower leg with unspecified severity: Secondary | ICD-10-CM | POA: Diagnosis not present

## 2020-09-19 DIAGNOSIS — Z992 Dependence on renal dialysis: Secondary | ICD-10-CM | POA: Diagnosis not present

## 2020-09-19 DIAGNOSIS — N186 End stage renal disease: Secondary | ICD-10-CM | POA: Diagnosis not present

## 2020-09-19 DIAGNOSIS — D509 Iron deficiency anemia, unspecified: Secondary | ICD-10-CM | POA: Diagnosis not present

## 2020-09-19 MED ORDER — SILVER SULFADIAZINE 1 % EX CREA: TOPICAL_CREAM | CUTANEOUS | 0 refills | Status: DC

## 2020-09-19 NOTE — Progress Notes (Signed)
  Subjective:  Patient ID: Travis Blair, male    DOB: 08-28-1950,  MRN: [id number]  Chief Complaint  Patient presents with   Diabetic Ulcer    Right foot ulcer. Patient denies vomiting, fever and chills. Patient does endorse feeling nauseous at times. Denies pain. Currently has unna boots in place.    70 y.o. male presents with the above complaint. History confirmed with patient.   Objective:  Physical Exam: warm, good capillary refill, nail exam onychomycosis of the toenails, no trophic changes or ulcerative lesions. DP pulses palpable, PT pulses palpable and protective sensation absent. Legs wrapped bilaterally Right Foot: healing ulcer right 5th met head - 0.5x0.5, no warmth, erythema, SOI.    No images are attached to the encounter.  Assessment:   1. Venous stasis ulcer of right lower leg with edema of right lower leg (HCC)   2. Venous stasis ulcer of left lower leg with edema of left lower leg (HCC)   3. Ulcer of right foot, limited to breakdown of skin [facility])    Plan:  Patient was evaluated and treated and all questions answered.  Venous leg ulcer legs -Continue compression dressings, not removed today.  Ulcer right foot, heel abrasion left -Continues to improve. Minimally debrided. Dressed with silvadene. Rx Silvadene  Updated orders called to his nurse Janett Billow [phone])  Return in about 3 weeks (around 10/10/2020) for Wound Care.

## 2020-09-20 ENCOUNTER — Telehealth: Payer: Self-pay

## 2020-09-20 NOTE — Telephone Encounter (Signed)
[facility]VS Received a request for Fistulagram from from [facility]  for poor functioning AVF. It is requested that Dr Early see patient in [place]. I am giving the request to [facility] who is doing Surgery Scheduling today.

## 2020-09-21 ENCOUNTER — Other Ambulatory Visit: Payer: Self-pay

## 2020-09-25 NOTE — Addendum Note (Signed)
Addended by: Nicholas Lose on: 09/25/2020 04:08 PM   Modules accepted: Orders

## 2020-09-26 DIAGNOSIS — D689 Coagulation defect, unspecified: Secondary | ICD-10-CM | POA: Diagnosis not present

## 2020-09-26 DIAGNOSIS — Z992 Dependence on renal dialysis: Secondary | ICD-10-CM | POA: Diagnosis not present

## 2020-09-26 DIAGNOSIS — D631 Anemia in chronic kidney disease: Secondary | ICD-10-CM | POA: Diagnosis not present

## 2020-09-26 DIAGNOSIS — D509 Iron deficiency anemia, unspecified: Secondary | ICD-10-CM | POA: Diagnosis not present

## 2020-09-26 DIAGNOSIS — N186 End stage renal disease: Secondary | ICD-10-CM | POA: Diagnosis not present

## 2020-09-26 DIAGNOSIS — N2581 Secondary hyperparathyroidism of renal origin: Secondary | ICD-10-CM | POA: Diagnosis not present

## 2020-09-29 ENCOUNTER — Ambulatory Visit (HOSPITAL_COMMUNITY)
Admission: RE | Admit: 2020-09-29 | Discharge: 2020-09-29 | Disposition: A | Discharge status: Home / Self Care | Payer: PPO | Attending: Vascular Surgery | Admitting: Vascular Surgery

## 2020-09-29 ENCOUNTER — Encounter (HOSPITAL_COMMUNITY): Payer: Self-pay | Admitting: Vascular Surgery

## 2020-09-29 ENCOUNTER — Other Ambulatory Visit: Payer: Self-pay

## 2020-09-29 ENCOUNTER — Encounter (HOSPITAL_COMMUNITY)
Admission: RE | Disposition: A | Discharge status: Home / Self Care | Payer: Self-pay | Source: Home / Self Care | Attending: Vascular Surgery

## 2020-09-29 DIAGNOSIS — Z992 Dependence on renal dialysis: Secondary | ICD-10-CM | POA: Insufficient documentation

## 2020-09-29 DIAGNOSIS — E1122 Type 2 diabetes mellitus with diabetic chronic kidney disease: Secondary | ICD-10-CM | POA: Diagnosis not present

## 2020-09-29 DIAGNOSIS — N186 End stage renal disease: Secondary | ICD-10-CM | POA: Diagnosis not present

## 2020-09-29 DIAGNOSIS — Y841 Kidney dialysis as the cause of abnormal reaction of the patient, or of later complication, without mention of misadventure at the time of the procedure: Secondary | ICD-10-CM | POA: Insufficient documentation

## 2020-09-29 DIAGNOSIS — T82510A Breakdown (mechanical) of surgically created arteriovenous fistula, initial encounter: Secondary | ICD-10-CM | POA: Insufficient documentation

## 2020-09-29 HISTORY — PX: A/V FISTULAGRAM: CATH118298

## 2020-09-29 LAB — POCT I-STAT, CHEM 8
BUN: 36 mg/dL — ABNORMAL HIGH (ref 8–23)
Calcium, Ion: 1.09 mmol/L — ABNORMAL LOW (ref 1.15–1.40)
Chloride: 99 mmol/L (ref 98–111)
Creatinine, Ser: 5 mg/dL — ABNORMAL HIGH (ref 0.61–1.24)
Glucose, Bld: 111 mg/dL — ABNORMAL HIGH (ref 70–99)
HCT: 34 % — ABNORMAL LOW (ref 39.0–52.0)
Hemoglobin: 11.6 g/dL — ABNORMAL LOW (ref 13.0–17.0)
Potassium: 3.9 mmol/L (ref 3.5–5.1)
Sodium: 136 mmol/L (ref 135–145)
TCO2: 26 mmol/L (ref 22–32)

## 2020-09-29 LAB — GLUCOSE, CAPILLARY: Glucose-Capillary: 98 mg/dL (ref 70–99)

## 2020-09-29 SURGERY — A/V FISTULAGRAM
Anesthesia: LOCAL | Laterality: Left

## 2020-09-29 MED ORDER — LIDOCAINE HCL (PF) 1 % IJ SOLN
INTRAMUSCULAR | Status: AC
  Filled 2020-09-29: qty 30

## 2020-09-29 MED ORDER — FENTANYL CITRATE (PF) 100 MCG/2ML IJ SOLN
INTRAMUSCULAR | Status: AC
  Filled 2020-09-29: qty 2

## 2020-09-29 MED ORDER — MIDAZOLAM HCL 2 MG/2ML IJ SOLN
INTRAMUSCULAR | Status: DC | PRN
  Administered 2020-09-29: 1 mg via INTRAVENOUS

## 2020-09-29 MED ORDER — HEPARIN (PORCINE) IN NACL 1000-0.9 UT/500ML-% IV SOLN
INTRAVENOUS | Status: AC
  Filled 2020-09-29: qty 500

## 2020-09-29 MED ORDER — LIDOCAINE HCL (PF) 1 % IJ SOLN
INTRAMUSCULAR | Status: DC | PRN
  Administered 2020-09-29: 4 mL

## 2020-09-29 MED ORDER — MIDAZOLAM HCL 2 MG/2ML IJ SOLN
INTRAMUSCULAR | Status: AC
  Filled 2020-09-29: qty 2

## 2020-09-29 MED ORDER — IODIXANOL 320 MG/ML IV SOLN
INTRAVENOUS | Status: DC | PRN
  Administered 2020-09-29: 30 mL

## 2020-09-29 MED ORDER — SODIUM CHLORIDE 0.9% FLUSH: 3.0000 mL | INTRAVENOUS | Status: DC | PRN

## 2020-09-29 MED ORDER — HEPARIN (PORCINE) IN NACL 1000-0.9 UT/500ML-% IV SOLN
INTRAVENOUS | Status: DC | PRN
Start: 2020-09-29 — End: 2020-09-29
  Administered 2020-09-29: 500 mL

## 2020-09-29 MED ORDER — SODIUM CHLORIDE 0.9 % IV SOLN: 250.0000 mL | INTRAVENOUS | Status: DC | PRN

## 2020-09-29 MED ORDER — FENTANYL CITRATE (PF) 100 MCG/2ML IJ SOLN
INTRAMUSCULAR | Status: DC | PRN
  Administered 2020-09-29: 50 ug via INTRAVENOUS

## 2020-09-29 MED ORDER — SODIUM CHLORIDE 0.9% FLUSH: 3.0000 mL | Freq: Two times a day (BID) | INTRAVENOUS | Status: DC

## 2020-09-29 SURGICAL SUPPLY — 8 items
COVER DOME SNAP 22 D (MISCELLANEOUS) ×2 IMPLANT
KIT MICROPUNCTURE NIT STIFF (SHEATH) ×2 IMPLANT
PROTECTION STATION PRESSURIZED (MISCELLANEOUS) ×2
SHEATH PROBE COVER 6X72 (BAG) ×2 IMPLANT
STATION PROTECTION PRESSURIZED (MISCELLANEOUS) ×1 IMPLANT
STOPCOCK MORSE 400PSI 3WAY (MISCELLANEOUS) ×2 IMPLANT
TRAY PV CATH (CUSTOM PROCEDURE TRAY) ×2 IMPLANT
TUBING CIL FLEX 10 FLL-RA (TUBING) ×2 IMPLANT

## 2020-09-29 NOTE — H&P (Signed)
   Patient name: Travis Blair MRN: [id number] DOB: Aug 10, 1950 Sex: male  REASON FOR VISIT:   For fistulogram and possible intervention  HPI:   Travis Blair is a pleasant 70 y.o. male who had a left brachiocephalic fistula placed by Dr. Sherren Mocha Early on 05/04/2020.  He dialyzes on Tuesdays Thursdays and Saturdays.  He has a functioning right IJ tunneled dialysis catheter.  He said he had an infiltrate in his fistula.  He was apparently set up for a fistulogram and possible intervention.  Current Facility-Administered Medications  Medication Dose Route Frequency Provider Last Rate Last Admin   0.9 %  sodium chloride infusion  250 mL Intravenous PRN Cherre Robins, MD       Heparin (Porcine) in NaCl 1000-0.9 UT/500ML-% SOLN    PRN Angelia Mould, MD   500 mL at 09/29/20 0842   sodium chloride flush (NS) 0.9 % injection 3 mL  3 mL Intravenous Q12H Cherre Robins, MD       sodium chloride flush (NS) 0.9 % injection 3 mL  3 mL Intravenous PRN Cherre Robins, MD        REVIEW OF SYSTEMS:  [X]  denotes positive finding, [ ]  denotes negative finding Vascular    Leg swelling    Cardiac    Chest pain or chest pressure:    Shortness of breath upon exertion:    Short of breath when lying flat:    Irregular heart rhythm:    Constitutional    Fever or chills:     PHYSICAL EXAM:   Vitals:   09/29/20 0700 09/29/20 0841  BP: (!) 159/75   Pulse: 86   Temp: 98.2 F (36.8 C)   TempSrc: Oral   SpO2: 93% 95%  Weight: 121.1 kg   Height: 6\' 2"  (1.88 m)     GENERAL: The patient is a well-nourished male, in no acute distress. The vital signs are documented above. CARDIOVASCULAR: There is a regular rate and rhythm. PULMONARY: There is good air exchange bilaterally without wheezing or rales. He has a palpable thrill in his left brachiocephalic AV fistula.  DATA:   His potassium is 3.9  MEDICAL ISSUES:   END-STAGE RENAL DISEASE: The patient presents for a fistulogram  and possible intervention.  I have discussed the indications for the procedure and the potential complications.  He is agreeable to proceed.  [name] [facility] [place] [phone]

## 2020-09-29 NOTE — Op Note (Signed)
   PATIENT: Travis Blair      MRN: [id number] DOB: June 04, 1950    DATE OF PROCEDURE: 09/29/2020  INDICATIONS:    [name] is a 70 y.o. male who had a left brachiocephalic fistula placed by Dr. Curt Jews in [place] on 05/04/2020.  He has a functioning tunneled dialysis catheter and dialyzes Tuesdays Thursdays and Saturdays.  They are having problems cannulating his fistula.  He was set up for a fistulogram  PROCEDURE:    Conscious sedation Ultrasound-guided access to left brachiocephalic fistula Fistulogram  SURGEON: Judeth Cornfield. Scot Dock, MD, FACS  ANESTHESIA: Local with sedation  EBL: Minimal  TECHNIQUE: The patient was brought to the peripheral vascular lab and was sedated.  He received 1 mg of Versed and 50 mcg of fentanyl.  His vital signs were monitored throughout the procedure.  Next the left arm was prepped and draped in the usual sterile fashion.  Under ultrasound guidance, after the skin was anesthetized, I cannulated the proximal fistula with a micropuncture needle and a micropuncture sheath was introduced over a wire.  There was a large competing branch just distal to the cannulation site.  A fistulogram was performed to evaluate the central veins and the entire fistula.  In addition the fistula was compressed proximally to try to demonstrate reflux into the arterial anastomosis.  Of note given the large competing branch we are unable to reflux into the arterial anastomosis.  However, the patient had an excellent thrill so I am not suspicious of a proximal anastomotic problem.   FINDINGS:   No central venous stenosis Widely patent left brachiocephalic fistula is with no areas of stenosis 2 large competing branches.  CLINICAL NOTE: This patient will be scheduled for ligation of the 2 large competing branches of his left brachiocephalic fistula by Dr. Donnetta Hutching in [place].  In addition he was concerned about the vein being deep so he would also require  superficialization.  He dialyzes on Tuesdays Thursdays and Saturdays so we will work around his dialysis schedule.  [name], MD, FACS [facility]  DATE OF DICTATION:   09/29/2020

## 2020-09-29 NOTE — H&P (View-Only) (Signed)
   Patient name: Travis Blair MRN: [id number] DOB: 05/20/1950 Sex: male  REASON FOR VISIT:   For fistulogram and possible intervention  HPI:   Travis Blair is a pleasant 70 y.o. male who had a left brachiocephalic fistula placed by Dr. Sherren Mocha Early on 05/04/2020.  He dialyzes on Tuesdays Thursdays and Saturdays.  He has a functioning right IJ tunneled dialysis catheter.  He said he had an infiltrate in his fistula.  He was apparently set up for a fistulogram and possible intervention.  Current Facility-Administered Medications  Medication Dose Route Frequency Provider Last Rate Last Admin   0.9 %  sodium chloride infusion  250 mL Intravenous PRN Cherre Robins, MD       Heparin (Porcine) in NaCl 1000-0.9 UT/500ML-% SOLN    PRN Angelia Mould, MD   500 mL at 09/29/20 0842   sodium chloride flush (NS) 0.9 % injection 3 mL  3 mL Intravenous Q12H Cherre Robins, MD       sodium chloride flush (NS) 0.9 % injection 3 mL  3 mL Intravenous PRN Cherre Robins, MD        REVIEW OF SYSTEMS:  [X]  denotes positive finding, [ ]  denotes negative finding Vascular    Leg swelling    Cardiac    Chest pain or chest pressure:    Shortness of breath upon exertion:    Short of breath when lying flat:    Irregular heart rhythm:    Constitutional    Fever or chills:     PHYSICAL EXAM:   Vitals:   09/29/20 0700 09/29/20 0841  BP: (!) 159/75   Pulse: 86   Temp: 98.2 F (36.8 C)   TempSrc: Oral   SpO2: 93% 95%  Weight: 121.1 kg   Height: 6\' 2"  (1.88 m)     GENERAL: The patient is a well-nourished male, in no acute distress. The vital signs are documented above. CARDIOVASCULAR: There is a regular rate and rhythm. PULMONARY: There is good air exchange bilaterally without wheezing or rales. He has a palpable thrill in his left brachiocephalic AV fistula.  DATA:   His potassium is 3.9  MEDICAL ISSUES:   END-STAGE RENAL DISEASE: The patient presents for a fistulogram  and possible intervention.  I have discussed the indications for the procedure and the potential complications.  He is agreeable to proceed.  [name] [facility] [place] [phone]

## 2020-10-03 DIAGNOSIS — N2581 Secondary hyperparathyroidism of renal origin: Secondary | ICD-10-CM | POA: Diagnosis not present

## 2020-10-03 DIAGNOSIS — N186 End stage renal disease: Secondary | ICD-10-CM | POA: Diagnosis not present

## 2020-10-03 DIAGNOSIS — D689 Coagulation defect, unspecified: Secondary | ICD-10-CM | POA: Diagnosis not present

## 2020-10-03 DIAGNOSIS — D509 Iron deficiency anemia, unspecified: Secondary | ICD-10-CM | POA: Diagnosis not present

## 2020-10-03 DIAGNOSIS — Z992 Dependence on renal dialysis: Secondary | ICD-10-CM | POA: Diagnosis not present

## 2020-10-04 DIAGNOSIS — L738 Other specified follicular disorders: Secondary | ICD-10-CM | POA: Diagnosis not present

## 2020-10-04 DIAGNOSIS — Z85828 Personal history of other malignant neoplasm of skin: Secondary | ICD-10-CM | POA: Diagnosis not present

## 2020-10-04 DIAGNOSIS — L97829 Non-pressure chronic ulcer of other part of left lower leg with unspecified severity: Secondary | ICD-10-CM | POA: Diagnosis not present

## 2020-10-04 DIAGNOSIS — Z992 Dependence on renal dialysis: Secondary | ICD-10-CM | POA: Diagnosis not present

## 2020-10-04 DIAGNOSIS — I872 Venous insufficiency (chronic) (peripheral): Secondary | ICD-10-CM | POA: Diagnosis not present

## 2020-10-04 DIAGNOSIS — Z86718 Personal history of other venous thrombosis and embolism: Secondary | ICD-10-CM | POA: Diagnosis not present

## 2020-10-04 DIAGNOSIS — Z6834 Body mass index (BMI) 34.0-34.9, adult: Secondary | ICD-10-CM | POA: Diagnosis not present

## 2020-10-04 DIAGNOSIS — Z951 Presence of aortocoronary bypass graft: Secondary | ICD-10-CM | POA: Diagnosis not present

## 2020-10-04 DIAGNOSIS — E1122 Type 2 diabetes mellitus with diabetic chronic kidney disease: Secondary | ICD-10-CM | POA: Diagnosis not present

## 2020-10-04 DIAGNOSIS — Z955 Presence of coronary angioplasty implant and graft: Secondary | ICD-10-CM | POA: Diagnosis not present

## 2020-10-04 DIAGNOSIS — L97819 Non-pressure chronic ulcer of other part of right lower leg with unspecified severity: Secondary | ICD-10-CM | POA: Diagnosis not present

## 2020-10-04 DIAGNOSIS — E1151 Type 2 diabetes mellitus with diabetic peripheral angiopathy without gangrene: Secondary | ICD-10-CM | POA: Diagnosis not present

## 2020-10-04 DIAGNOSIS — I2510 Atherosclerotic heart disease of native coronary artery without angina pectoris: Secondary | ICD-10-CM | POA: Diagnosis not present

## 2020-10-04 DIAGNOSIS — E669 Obesity, unspecified: Secondary | ICD-10-CM | POA: Diagnosis not present

## 2020-10-04 DIAGNOSIS — N186 End stage renal disease: Secondary | ICD-10-CM | POA: Diagnosis not present

## 2020-10-04 DIAGNOSIS — E785 Hyperlipidemia, unspecified: Secondary | ICD-10-CM | POA: Diagnosis not present

## 2020-10-04 DIAGNOSIS — Z86711 Personal history of pulmonary embolism: Secondary | ICD-10-CM | POA: Diagnosis not present

## 2020-10-04 DIAGNOSIS — L570 Actinic keratosis: Secondary | ICD-10-CM | POA: Diagnosis not present

## 2020-10-04 DIAGNOSIS — I4891 Unspecified atrial fibrillation: Secondary | ICD-10-CM | POA: Diagnosis not present

## 2020-10-04 DIAGNOSIS — D485 Neoplasm of uncertain behavior of skin: Secondary | ICD-10-CM | POA: Diagnosis not present

## 2020-10-04 DIAGNOSIS — L905 Scar conditions and fibrosis of skin: Secondary | ICD-10-CM | POA: Diagnosis not present

## 2020-10-04 DIAGNOSIS — I252 Old myocardial infarction: Secondary | ICD-10-CM | POA: Diagnosis not present

## 2020-10-04 DIAGNOSIS — S90921D Unspecified superficial injury of right foot, subsequent encounter: Secondary | ICD-10-CM | POA: Diagnosis not present

## 2020-10-04 DIAGNOSIS — D692 Other nonthrombocytopenic purpura: Secondary | ICD-10-CM | POA: Diagnosis not present

## 2020-10-04 DIAGNOSIS — K5900 Constipation, unspecified: Secondary | ICD-10-CM | POA: Diagnosis not present

## 2020-10-04 DIAGNOSIS — Z79899 Other long term (current) drug therapy: Secondary | ICD-10-CM | POA: Diagnosis not present

## 2020-10-04 DIAGNOSIS — I272 Pulmonary hypertension, unspecified: Secondary | ICD-10-CM | POA: Diagnosis not present

## 2020-10-04 DIAGNOSIS — G4733 Obstructive sleep apnea (adult) (pediatric): Secondary | ICD-10-CM | POA: Diagnosis not present

## 2020-10-04 DIAGNOSIS — D631 Anemia in chronic kidney disease: Secondary | ICD-10-CM | POA: Diagnosis not present

## 2020-10-04 DIAGNOSIS — I5033 Acute on chronic diastolic (congestive) heart failure: Secondary | ICD-10-CM | POA: Diagnosis not present

## 2020-10-04 DIAGNOSIS — I132 Hypertensive heart and chronic kidney disease with heart failure and with stage 5 chronic kidney disease, or end stage renal disease: Secondary | ICD-10-CM | POA: Diagnosis not present

## 2020-10-06 ENCOUNTER — Other Ambulatory Visit: Payer: Self-pay

## 2020-10-06 ENCOUNTER — Encounter: Payer: Self-pay | Admitting: Physician Assistant

## 2020-10-06 ENCOUNTER — Ambulatory Visit: Payer: PPO | Admitting: Physician Assistant

## 2020-10-06 ENCOUNTER — Encounter (HOSPITAL_COMMUNITY): Payer: Self-pay | Admitting: Vascular Surgery

## 2020-10-06 VITALS — BP 132/62 | HR 72 | Ht 73.0 in | Wt 272.0 lb

## 2020-10-06 DIAGNOSIS — Z86711 Personal history of pulmonary embolism: Secondary | ICD-10-CM

## 2020-10-06 DIAGNOSIS — D509 Iron deficiency anemia, unspecified: Secondary | ICD-10-CM | POA: Diagnosis not present

## 2020-10-06 DIAGNOSIS — Z992 Dependence on renal dialysis: Secondary | ICD-10-CM | POA: Diagnosis not present

## 2020-10-06 DIAGNOSIS — N186 End stage renal disease: Secondary | ICD-10-CM | POA: Diagnosis not present

## 2020-10-06 DIAGNOSIS — E1165 Type 2 diabetes mellitus with hyperglycemia: Secondary | ICD-10-CM | POA: Diagnosis not present

## 2020-10-06 DIAGNOSIS — I2510 Atherosclerotic heart disease of native coronary artery without angina pectoris: Secondary | ICD-10-CM | POA: Diagnosis not present

## 2020-10-06 NOTE — Progress Notes (Signed)
Spoke with pt's wife, Travis Blair for pre-op call. DPR on file. Pt has hx of CAD with CABG, Dr. Acie Fredrickson is his cardiologist. Pt has hx of CHF, PE, HTN, and Diabetes. He is on dialysis and no longer takes medications for HTN or Diabetes. Wife states pt's blood sugar dropped too low when taking Glipizide, so he stopped it and no longer has problems with blood sugar dropping. States his fasting blood sugar is usually around 122 or less. Last A1C was 8.0 on 08/18/20. Pt recently diagnosed with moderate Pulmonary HTN, was prescribed Lasix. Travis Blair states the dialysis doctor has taken him off of that since he is on dialysis.  Pt had Covid and had blood clots/PE. Pt was on Eliquis for the blood clots but was taken off when he started with blood in his stool.  Pt's surgery is scheduled as ambulatory so no Covid test is required prior to surgery.

## 2020-10-06 NOTE — Progress Notes (Signed)
Chief Complaint: Follow-up iron deficiency anemia  HPI:    Travis Blair is a 70 year old male with a past medical history of hypertension, CAD status post MI 2018 status post three-vessel CABG 03/07/2017 with postoperative A. fib, chronic diastolic CHF with LVEF 45%, left lower extremity DVT 2018 and 11/21 and PE 11/21 on Eliquis, hyperlipidemia, diabetes type 2, CKD stage IV, chronic anemia, OSA on CPAP and COVID-19 infection 8/21, who was assigned to Dr. Christella Hartigan at last visit and returns to clinic today for follow-up of his iron deficiency anemia.    04/03/2020 patient seen in clinic by Alcide Evener, NP for iron deficiency anemia.  At that time had been referred for evaluation of anemia with a hemoglobin of 8.6 (baseline hemoglobin 11.6 on 09/02/2019).  Last colonoscopy 03/16/2003 which was incomplete due to poor bowel prep.  Discussed his history of significant heart disease.  At that time patient complained of shortness of breath mostly at night unable to lay flat.  Also 2-3+ pitting edema to his lower extremities.  Also discussed his CKD.  At that time recommended he have cardiac clearance with Dr. Lourena Simmonds to include Eliquis prior to scheduling EGD and colonoscopy.    05/04/20 patient had a left brachiocephalic fistula placed by Dr. Tawanna Cooler early.  He is now on dialysis Tuesday, Thursdays and Saturdays.    06/06/2020 patient had an echo which showed signs of potential pulmonary hypertension.  LVEF 60-65%.    09/01/2020 patient had admission for CHF exacerbation with worsening lower extremity edema and shortness of breath.  Also noted to have worsening renal failure.  At that time still had 3+ pitting edema in all 4 extremities.  His anemia was discussed and it was suspected large component from underlying renal disease.  He was started on iron infusion as well as ESA.  That time hemoglobin down to a nadir of 6.7 during that admission which resolved after 1 unit of PRBCs.  At discharge it was 8.2.  His  Eliquis had been stopped 5/22 after he was noted to have heme positive stools.    09/29/2020 hemoglobin 11.6 (8.2 on 09/08/2020)    Today, patient returns to clinic.  He discusses all the things that he has had going on recently.  He has recently had an AV fistula placed and started on dialysis, he continues to follow with his pulmonologist as well as cardiologist and now nephrologist in regards to exacerbation of CHF, ESRD and pulmonary hypertension which was newly diagnosed.  Tells me he still has trouble if he tries to lay flat on his back with breathing, but overall they are turning him up slowly.  Tells me that he has never seen any blood in his stool.  Apparently they repeated a Hemoccult test while he was in the hospital which was negative.  Explains that he has continued to get Feraheme infusions on a regular basis.    Denies fever, chills, abdominal pain, change in bowel habits, heartburn, reflux, nausea or vomiting.  Past Medical History:  Diagnosis Date   Anemia associated with chronic renal failure basically   Asthma    as a child   CHF (congestive heart failure) (HCC)    Chronic kidney disease    Sees Dr Hyman Hopes   Constipation    Coronary artery disease    COVID-19    Depression    Diabetes mellitus without complication (HCC)    DVT (deep venous thrombosis) (HCC)    History of blood clots  Hyperlipidemia    Hypertension    Leg pain    ABIs/LE Arterial US 03/2019: ABIs unreliable; +calcification, no evidence of stenosis   Myocardial infarction [facility])    Obesity    Pneumonia 2018   Pulmonary embolism (HCC)    Sleep apnea    CPAP   SOB (shortness of breath)    Swelling of both lower extremities     Past Surgical History:  Procedure Laterality Date   A/V FISTULAGRAM Left 09/29/2020   Procedure: A/V FISTULAGRAM;  Surgeon: Angelia Mould, MD;  Location: [place] [facility];  Service: Cardiovascular;  Laterality: Left;   AV FISTULA PLACEMENT Left 05/04/2020   Procedure:  LEFT ARM ARTERIOVENOUS (AV) FISTULA CREATION;  Surgeon: Rosetta Posner, MD;  Location: [facility];  Service: Vascular;  Laterality: Left;   COLONOSCOPY     CORONARY ARTERY BYPASS GRAFT N/A 03/07/2017   Procedure: CORONARY ARTERY BYPASS GRAFTING (CABG), X 3 , USING RIGHT INTERNAL MAMMARY ARTERY, AND RIGHT LEG GREATER SAPHENOUS VEIN HARVESTED ENDOSCOPICALLY;  Surgeon: [name], MD;  Location: [place];  Service: Open Heart Surgery;  Laterality: N/A;   HAND SURGERY Right 1991   IR FLUORO GUIDE CV LINE RIGHT  09/04/2020   IR US GUIDE VASC ACCESS RIGHT  09/04/2020   RIGHT HEART CATH N/A 07/07/2020   Procedure: RIGHT HEART CATH;  Surgeon: Jolaine Artist, MD;  Location: [place] [facility];  Service: Cardiovascular;  Laterality: N/A;   RIGHT/LEFT HEART CATH AND CORONARY ANGIOGRAPHY N/A 12/20/2016   Procedure: RIGHT/LEFT HEART CATH AND CORONARY ANGIOGRAPHY;  Surgeon: [name], MD;  Location: [place] [facility];  Service: Cardiovascular;  Laterality: N/A;   TEE WITHOUT CARDIOVERSION N/A 03/07/2017   Procedure: TRANSESOPHAGEAL ECHOCARDIOGRAM (TEE);  Surgeon: [name], [name], MD;  Location: [place];  Service: Open Heart Surgery;  Laterality: N/A;   TONSILLECTOMY      Current Outpatient Medications  Medication Sig Dispense Refill   albuterol (VENTOLIN HFA) 108 (90 Base) MCG/ACT inhaler Inhale 1-2 puffs into the lungs every 6 (six) hours as needed for wheezing or shortness of breath. 18 g 3   calcium acetate (PHOSLO) 667 MG capsule Take 1,334 mg by mouth 3 (three) times daily.     Evolocumab (REPATHA SURECLICK) 320 MG/ML SOAJ Inject 1 pen into the skin every 14 (fourteen) days. 6 mL 3   ferrous sulfate (FERROUSUL) 325 (65 FE) MG tablet Take 1 tablet (325 mg total) by mouth daily with breakfast.  3   fluticasone (FLONASE) 50 MCG/ACT nasal spray Place 2 sprays into both nostrils daily. (Patient taking differently: Place 2 sprays into both nostrils daily as needed for allergies.) 16 g 6    pantoprazole (PROTONIX) 40 MG tablet Take 1 tablet (40 mg total) by mouth daily. 30 tablet 3   No current facility-administered medications for this visit.    Allergies as of 10/06/2020 - Review Complete 10/06/2020  Allergen Reaction Noted   Latex Hives and Rash 09/03/2020   Lipitor [atorvastatin]  03/23/2019    Family History  Problem Relation Age of Onset   Heart disease Mother    Diabetes Mother    Multiple myeloma Mother    Cancer Father        type unknown, as a child   Heart disease Father    Hypertension Father    Congenital heart disease Father    Hyperlipidemia Father    Lymphoma Sister     Social History   Socioeconomic History  Marital status: Married    Spouse name: Not on file   Number of children: 2   Years of education: Not on file   Highest education level: Not on file  Occupational History   Occupation: general contractor-retired  Tobacco Use   Smoking status: Never   Smokeless tobacco: Never  Vaping Use   Vaping Use: Never used  Substance and Sexual Activity   Alcohol use: No   Drug use: No   Sexual activity: Never  Other Topics Concern   Not on file  Social History Narrative   Not on file   Social Determinants of Health   Financial Resource Strain: Not on file  Food Insecurity: Not on file  Transportation Needs: Not on file  Physical Activity: Not on file  Stress: Not on file  Social Connections: Not on file  Intimate Partner Violence: Not on file    Review of Systems:    Constitutional: No weight loss, fever or chills Cardiovascular: No chest pain Respiratory: +SOB Gastrointestinal: See HPI and otherwise negative   Physical Exam:  Vital signs: BP 132/62   Pulse 72   Ht $R'6\' 1"'vp$  (1.854 m)   Wt 272 lb (123.4 kg)   BMI 35.89 kg/m    Constitutional:   Pleasant chronically ill appearing Caucasian male appears to be in NAD, Well developed, Well nourished, alert and cooperative Respiratory: Respirations even and unlabored. Lungs  clear to auscultation bilaterally.   No wheezes, crackles, or rhonchi.  Cardiovascular: Normal S1, S2. No MRG. Regular rate and rhythm. 3+edema to level of the knee Gastrointestinal:  Soft, nondistended, nontender. No rebound or guarding. Normal bowel sounds. No appreciable masses or hepatomegaly. Rectal:  Not performed.  Psychiatric: Oriented to person, place and time. Demonstrates good judgement and reason without abnormal affect or behaviors.  RELEVANT LABS AND IMAGING: CBC    Component Value Date/Time   WBC 9.2 09/08/2020 0736   RBC 3.42 (L) 09/08/2020 0736   HGB 11.6 (L) 09/29/2020 0740   HGB 11.6 (L) 09/02/2019 1331   HCT 34.0 (L) 09/29/2020 0740   HCT 37.8 09/02/2019 1331   PLT 214 09/08/2020 0736   PLT 231 09/02/2019 1331   MCV 83.0 09/08/2020 0736   MCV 86 09/02/2019 1331   MCH 24.0 (L) 09/08/2020 0736   MCHC 28.9 (L) 09/08/2020 0736   RDW 21.0 (H) 09/08/2020 0736   RDW 14.6 09/02/2019 1331   LYMPHSABS 0.8 09/01/2020 2140   LYMPHSABS 1.4 09/02/2019 1331   MONOABS 0.9 09/01/2020 2140   EOSABS 0.1 09/01/2020 2140   EOSABS 0.2 09/02/2019 1331   BASOSABS 0.0 09/01/2020 2140   BASOSABS 0.0 09/02/2019 1331    CMP     Component Value Date/Time   NA 136 09/29/2020 0740   NA 138 09/02/2019 1331   K 3.9 09/29/2020 0740   CL 99 09/29/2020 0740   CO2 23 09/08/2020 0736   GLUCOSE 111 (H) 09/29/2020 0740   BUN 36 (H) 09/29/2020 0740   BUN 51 (H) 09/02/2019 1331   CREATININE 5.00 (H) 09/29/2020 0740   CREATININE 3.49 (H) 05/23/2020 1426   CALCIUM 8.4 (L) 09/08/2020 0736   PROT 6.0 (L) 09/01/2020 1819   PROT 6.0 08/25/2019 0809   ALBUMIN 2.3 (L) 09/08/2020 0736   ALBUMIN 3.5 (L) 08/25/2019 0809   AST 17 09/01/2020 1819   ALT 15 09/01/2020 1819   ALKPHOS 55 09/01/2020 1819   BILITOT 0.8 09/01/2020 1819   BILITOT 0.2 08/25/2019 0809   GFRNONAA 11 (L) 09/08/2020  [place] (L) 05/23/2020 1426   GFRAA 20 (L) 05/23/2020 1426    Assessment: 1.  Iron deficiency  anemia: Initially also Hemoccult positive, repeat in the hospital recently 7/28 was negative, patient doing well with Feraheme infusions, recently hemoglobin back to 11.6, no new GI symptoms, no longer on Eliquis 2.  CAD status post three-vessel CABG 06/3008, diastolic CHF 3.  History of lower extremity DVT/PE: No longer on Eliquis after Hemoccult positive back in May, recent recheck of stool was negative, patient tells me they have no plans to restart him on anticoagulation 4.  ESRD on dialysis 5.  Diabetes type 2 6.  History of postop A. fib  Plan: 1.  Discussed with patient that he is very high risk for any procedures.  Given that his hemoglobin seems to be responding to regular Feraheme infusions and per him there is no plan for him to start back on Eliquis, this lessens the necessity for his procedures.  Explained that due to his multiple complications I will confirm this with Dr. Ardis Hughs, but likely we do not need to be putting him to sleep for anything right now.  Certainly if he started with overt GI bleeding and these procedures are need to be done in the hospital 2.  Will leave hemoglobin monitoring and iron to his PCP 3.  Patient to follow in clinic with [place] as needed.  Ellouise Newer, PA-C [facility] 10/06/2020, 2:04 PM  Cc: [name], MD

## 2020-10-06 NOTE — Patient Instructions (Signed)
Follow up as needed.  If you are age 69 or older, your body mass index should be between 23-30. Your Body mass index is 35.89 kg/m. If this is out of the aforementioned range listed, please consider follow up with your Primary Care Provider.  If you are age 6 or younger, your body mass index should be between 19-25. Your Body mass index is 35.89 kg/m. If this is out of the aformentioned range listed, please consider follow up with your Primary Care Provider.   __________________________________________________________  The [facility] providers would like to encourage you to use [facility] to communicate with providers for non-urgent requests or questions.  Due to long hold times on the telephone, sending your provider a message by [facility] may be a faster and more efficient way to get a response.  Please allow 48 business hours for a response.  Please remember that this is for non-urgent requests.

## 2020-10-09 ENCOUNTER — Encounter (HOSPITAL_COMMUNITY): Payer: Self-pay | Admitting: Vascular Surgery

## 2020-10-09 ENCOUNTER — Ambulatory Visit (HOSPITAL_COMMUNITY)
Admission: RE | Admit: 2020-10-09 | Discharge: 2020-10-09 | Disposition: A | Discharge status: Home / Self Care | Payer: PPO | Attending: Vascular Surgery | Admitting: Vascular Surgery

## 2020-10-09 ENCOUNTER — Encounter (HOSPITAL_COMMUNITY)
Admission: RE | Disposition: A | Discharge status: Home / Self Care | Payer: Self-pay | Source: Home / Self Care | Attending: Vascular Surgery

## 2020-10-09 ENCOUNTER — Other Ambulatory Visit: Payer: Self-pay

## 2020-10-09 ENCOUNTER — Ambulatory Visit (HOSPITAL_COMMUNITY): Payer: PPO | Admitting: Anesthesiology

## 2020-10-09 DIAGNOSIS — T82898A Other specified complication of vascular prosthetic devices, implants and grafts, initial encounter: Secondary | ICD-10-CM | POA: Diagnosis not present

## 2020-10-09 DIAGNOSIS — I132 Hypertensive heart and chronic kidney disease with heart failure and with stage 5 chronic kidney disease, or end stage renal disease: Secondary | ICD-10-CM | POA: Diagnosis not present

## 2020-10-09 DIAGNOSIS — N186 End stage renal disease: Secondary | ICD-10-CM | POA: Diagnosis not present

## 2020-10-09 DIAGNOSIS — Y841 Kidney dialysis as the cause of abnormal reaction of the patient, or of later complication, without mention of misadventure at the time of the procedure: Secondary | ICD-10-CM | POA: Insufficient documentation

## 2020-10-09 DIAGNOSIS — E1122 Type 2 diabetes mellitus with diabetic chronic kidney disease: Secondary | ICD-10-CM | POA: Diagnosis not present

## 2020-10-09 DIAGNOSIS — I5033 Acute on chronic diastolic (congestive) heart failure: Secondary | ICD-10-CM | POA: Diagnosis not present

## 2020-10-09 DIAGNOSIS — T82510A Breakdown (mechanical) of surgically created arteriovenous fistula, initial encounter: Secondary | ICD-10-CM | POA: Diagnosis not present

## 2020-10-09 DIAGNOSIS — Z992 Dependence on renal dialysis: Secondary | ICD-10-CM | POA: Diagnosis not present

## 2020-10-09 HISTORY — DX: Depression, unspecified: F32.A

## 2020-10-09 HISTORY — PX: FISTULA SUPERFICIALIZATION: SHX6341

## 2020-10-09 HISTORY — DX: COVID-19: U07.1

## 2020-10-09 HISTORY — PX: LIGATION OF COMPETING BRANCHES OF ARTERIOVENOUS FISTULA: SHX5949

## 2020-10-09 LAB — GLUCOSE, CAPILLARY
Glucose-Capillary: 109 mg/dL — ABNORMAL HIGH (ref 70–99)
Glucose-Capillary: 92 mg/dL (ref 70–99)
Glucose-Capillary: 117 mg/dL — ABNORMAL HIGH (ref 70–99)

## 2020-10-09 LAB — POCT I-STAT, CHEM 8
BUN: 55 mg/dL — ABNORMAL HIGH (ref 8–23)
Calcium, Ion: 1.06 mmol/L — ABNORMAL LOW (ref 1.15–1.40)
Chloride: 99 mmol/L (ref 98–111)
Creatinine, Ser: 5.2 mg/dL — ABNORMAL HIGH (ref 0.61–1.24)
Glucose, Bld: 107 mg/dL — ABNORMAL HIGH (ref 70–99)
HCT: 40 % (ref 39.0–52.0)
Hemoglobin: 13.6 g/dL (ref 13.0–17.0)
Potassium: 4.4 mmol/L (ref 3.5–5.1)
Sodium: 135 mmol/L (ref 135–145)
TCO2: 26 mmol/L (ref 22–32)

## 2020-10-09 SURGERY — LIGATION OF COMPETING BRANCHES OF ARTERIOVENOUS FISTULA
Anesthesia: General | Laterality: Left

## 2020-10-09 MED ORDER — FENTANYL CITRATE (PF) 100 MCG/2ML IJ SOLN
INTRAMUSCULAR | Status: DC | PRN
  Administered 2020-10-09: 50 ug via INTRAVENOUS

## 2020-10-09 MED ORDER — VASOPRESSIN 20 UNIT/ML IV SOLN
INTRAVENOUS | Status: DC | PRN
  Administered 2020-10-09 (×2): 2 [IU] via INTRAVENOUS

## 2020-10-09 MED ORDER — SODIUM CHLORIDE 0.9 % IV SOLN: INTRAVENOUS | Status: DC

## 2020-10-09 MED ORDER — HEPARIN 6000 UNIT IRRIGATION SOLUTION
Status: AC
  Filled 2020-10-09: qty 500

## 2020-10-09 MED ORDER — LIDOCAINE-EPINEPHRINE (PF) 1 %-1:200000 IJ SOLN
INTRAMUSCULAR | Status: AC
  Filled 2020-10-09: qty 30

## 2020-10-09 MED ORDER — PHENYLEPHRINE HCL (PRESSORS) 10 MG/ML IV SOLN
INTRAVENOUS | Status: DC | PRN
  Administered 2020-10-09: 80 ug via INTRAVENOUS
  Administered 2020-10-09: 100 ug via INTRAVENOUS

## 2020-10-09 MED ORDER — MIDAZOLAM HCL 5 MG/5ML IJ SOLN
INTRAMUSCULAR | Status: DC | PRN
  Administered 2020-10-09 (×2): 1 mg via INTRAVENOUS

## 2020-10-09 MED ORDER — CHLORHEXIDINE GLUCONATE 0.12 % MT SOLN
15.0000 mL | Freq: Once | OROMUCOSAL | Status: AC
  Administered 2020-10-09: 15 mL via OROMUCOSAL
  Filled 2020-10-09: qty 15

## 2020-10-09 MED ORDER — LIDOCAINE-EPINEPHRINE (PF) 1 %-1:200000 IJ SOLN
INTRAMUSCULAR | Status: DC | PRN
  Administered 2020-10-09: 10 mL

## 2020-10-09 MED ORDER — LIDOCAINE HCL (PF) 1 % IJ SOLN
INTRAMUSCULAR | Status: AC
  Filled 2020-10-09: qty 30

## 2020-10-09 MED ORDER — EPHEDRINE SULFATE-NACL 50-0.9 MG/10ML-% IV SOSY
PREFILLED_SYRINGE | INTRAVENOUS | Status: DC | PRN
  Administered 2020-10-09: 5 mg via INTRAVENOUS
  Administered 2020-10-09: 10 mg via INTRAVENOUS

## 2020-10-09 MED ORDER — EPINEPHRINE 1 MG/10ML IJ SOSY
PREFILLED_SYRINGE | INTRAMUSCULAR | Status: DC | PRN
  Administered 2020-10-09 (×2): 10 ug via INTRAVENOUS

## 2020-10-09 MED ORDER — 0.9 % SODIUM CHLORIDE (POUR BTL) OPTIME
TOPICAL | Status: DC | PRN
  Administered 2020-10-09: 1000 mL

## 2020-10-09 MED ORDER — FENTANYL CITRATE (PF) 250 MCG/5ML IJ SOLN
INTRAMUSCULAR | Status: AC
  Filled 2020-10-09: qty 5

## 2020-10-09 MED ORDER — LIDOCAINE 2% (20 MG/ML) 5 ML SYRINGE
INTRAMUSCULAR | Status: DC | PRN
  Administered 2020-10-09: 100 mg via INTRAVENOUS

## 2020-10-09 MED ORDER — LIDOCAINE 2% (20 MG/ML) 5 ML SYRINGE
INTRAMUSCULAR | Status: AC
  Filled 2020-10-09: qty 5

## 2020-10-09 MED ORDER — HEPARIN 6000 UNIT IRRIGATION SOLUTION
Status: DC | PRN
  Administered 2020-10-09: 1

## 2020-10-09 MED ORDER — FENTANYL CITRATE (PF) 100 MCG/2ML IJ SOLN: 25.0000 ug | INTRAMUSCULAR | Status: DC | PRN

## 2020-10-09 MED ORDER — ONDANSETRON HCL 4 MG/2ML IJ SOLN
INTRAMUSCULAR | Status: AC
  Filled 2020-10-09: qty 2

## 2020-10-09 MED ORDER — ONDANSETRON HCL 4 MG/2ML IJ SOLN: 4.0000 mg | Freq: Once | INTRAMUSCULAR | Status: DC | PRN

## 2020-10-09 MED ORDER — ACETAMINOPHEN 500 MG PO TABS
1000.0000 mg | ORAL_TABLET | Freq: Once | ORAL | Status: AC
  Administered 2020-10-09: 1000 mg via ORAL
  Filled 2020-10-09: qty 2

## 2020-10-09 MED ORDER — MIDAZOLAM HCL 2 MG/2ML IJ SOLN
INTRAMUSCULAR | Status: AC
  Filled 2020-10-09: qty 2

## 2020-10-09 MED ORDER — CHLORHEXIDINE GLUCONATE 4 % EX LIQD: 60.0000 mL | Freq: Once | CUTANEOUS | Status: DC

## 2020-10-09 MED ORDER — PHENYLEPHRINE HCL-NACL 20-0.9 MG/250ML-% IV SOLN
INTRAVENOUS | Status: DC | PRN
  Administered 2020-10-09: 50 ug/min via INTRAVENOUS

## 2020-10-09 MED ORDER — CEFAZOLIN SODIUM-DEXTROSE 2-4 GM/100ML-% IV SOLN
2.0000 g | INTRAVENOUS | Status: AC
  Administered 2020-10-09: 2 g via INTRAVENOUS
  Filled 2020-10-09: qty 100

## 2020-10-09 MED ORDER — OXYCODONE-ACETAMINOPHEN 5-325 MG PO TABS: 1.0000 | ORAL_TABLET | Freq: Four times a day (QID) | ORAL | 0 refills | Status: DC | PRN

## 2020-10-09 MED ORDER — ORAL CARE MOUTH RINSE: 15.0000 mL | Freq: Once | OROMUCOSAL | Status: AC

## 2020-10-09 MED ORDER — PROPOFOL 10 MG/ML IV BOLUS
INTRAVENOUS | Status: DC | PRN
  Administered 2020-10-09: 100 mg via INTRAVENOUS

## 2020-10-09 SURGICAL SUPPLY — 37 items
ADH SKN CLS APL DERMABOND .7 (GAUZE/BANDAGES/DRESSINGS) ×1
ARMBAND PINK RESTRICT EXTREMIT (MISCELLANEOUS) ×3 IMPLANT
BAG COUNTER SPONGE SURGICOUNT (BAG) ×2 IMPLANT
BAG SPNG CNTER NS LX DISP (BAG) ×1
BAG SURGICOUNT SPONGE COUNTING (BAG) ×1
CANISTER SUCT 3000ML PPV (MISCELLANEOUS) ×3 IMPLANT
CANNULA VESSEL 3MM 2 BLNT TIP (CANNULA) ×3 IMPLANT
CLIP VESOCCLUDE MED 6/CT (CLIP) ×3 IMPLANT
CLIP VESOCCLUDE SM WIDE 6/CT (CLIP) ×3 IMPLANT
COVER PROBE W GEL 5X96 (DRAPES) ×3 IMPLANT
DECANTER SPIKE VIAL GLASS SM (MISCELLANEOUS) ×3 IMPLANT
DERMABOND ADVANCED (GAUZE/BANDAGES/DRESSINGS) ×2
DERMABOND ADVANCED .7 DNX12 (GAUZE/BANDAGES/DRESSINGS) ×1 IMPLANT
ELECT REM PT RETURN 9FT ADLT (ELECTROSURGICAL) ×3
ELECTRODE REM PT RTRN 9FT ADLT (ELECTROSURGICAL) ×1 IMPLANT
GAUZE 4X4 16PLY ~~LOC~~+RFID DBL (SPONGE) ×2 IMPLANT
GLOVE SRG 8 PF TXTR STRL LF DI (GLOVE) ×1 IMPLANT
GLOVE SURG ENC MOIS LTX SZ7.5 (GLOVE) ×3 IMPLANT
GLOVE SURG UNDER POLY LF SZ8 (GLOVE) ×3
GOWN STRL REUS W/ TWL LRG LVL3 (GOWN DISPOSABLE) ×3 IMPLANT
GOWN STRL REUS W/TWL LRG LVL3 (GOWN DISPOSABLE) ×9
KIT BASIN OR (CUSTOM PROCEDURE TRAY) ×3 IMPLANT
KIT TURNOVER KIT B (KITS) ×3 IMPLANT
NS IRRIG 1000ML POUR BTL (IV SOLUTION) ×3 IMPLANT
PACK CV ACCESS (CUSTOM PROCEDURE TRAY) ×3 IMPLANT
PAD ARMBOARD 7.5X6 YLW CONV (MISCELLANEOUS) ×6 IMPLANT
SPONGE SURGIFOAM ABS GEL 100 (HEMOSTASIS) IMPLANT
SPONGE T-LAP 18X18 ~~LOC~~+RFID (SPONGE) ×2 IMPLANT
SUT ETHILON 3 0 PS 1 (SUTURE) IMPLANT
SUT MNCRL AB 4-0 PS2 18 (SUTURE) ×5 IMPLANT
SUT PROLENE 6 0 BV (SUTURE) ×5 IMPLANT
SUT SILK 0 TIES 10X30 (SUTURE) ×3 IMPLANT
SUT VIC AB 3-0 SH 27 (SUTURE) ×3
SUT VIC AB 3-0 SH 27X BRD (SUTURE) ×1 IMPLANT
TOWEL GREEN STERILE (TOWEL DISPOSABLE) ×3 IMPLANT
UNDERPAD 30X36 HEAVY ABSORB (UNDERPADS AND DIAPERS) ×3 IMPLANT
WATER STERILE IRR 1000ML POUR (IV SOLUTION) ×3 IMPLANT

## 2020-10-09 NOTE — Anesthesia Procedure Notes (Signed)
Procedure Name: Intubation Date/Time: 10/09/2020 10:05 AM Performed by: Terrence Dupont, CRNA Pre-anesthesia Checklist: Patient identified, Emergency Drugs available, Suction available and Patient being monitored Patient Re-evaluated:Patient Re-evaluated prior to induction Oxygen Delivery Method: Circle system utilized Preoxygenation: Pre-oxygenation with 100% oxygen Induction Type: IV induction Ventilation: Mask ventilation without difficulty LMA: LMA inserted LMA Size: 5.0 Tube type: Oral Number of attempts: 1 Airway Equipment and Method: Stylet and Oral airway Placement Confirmation: ETT inserted through vocal cords under direct vision, positive ETCO2 and breath sounds checked- equal and bilateral Tube secured with: Tape Dental Injury: Teeth and Oropharynx as per pre-operative assessment

## 2020-10-09 NOTE — Transfer of Care (Signed)
Immediate Anesthesia Transfer of Care Note  Patient: Travis Blair  Procedure(s) Performed: LIGATION OF 2 LARGE COMPETING BRANCHES OF LEFT BRACHIOCEPHALIC FISTULA (Left) FISTULA SUPERFICIALIZATION (Left)  Patient Location: PACU  Anesthesia Type:General  Level of Consciousness: awake and alert   Airway & Oxygen Therapy: Patient Spontanous Breathing  Post-op Assessment: Report given to RN and Post -op Vital signs reviewed and stable  Post vital signs: Reviewed and stable  Last Vitals:  Vitals Value Taken Time  BP 154/68 10/09/20 1132  Temp    Pulse 75 10/09/20 1134  Resp 22 10/09/20 1134  SpO2 92 % 10/09/20 1134  Vitals shown include unvalidated device data.  Last Pain:  Vitals:   10/09/20 0753  TempSrc:   PainSc: 0-No pain      Patients Stated Pain Goal: 3 (51/89/84 2103)  Complications: No notable events documented.

## 2020-10-09 NOTE — Anesthesia Preprocedure Evaluation (Addendum)
Anesthesia Evaluation  Patient identified by MRN, date of birth, ID band Patient awake    Reviewed: Allergy & Precautions, NPO status , Patient's Chart, lab work & pertinent test results  Airway Mallampati: II  TM Distance: >3 FB Neck ROM: Full    Dental  (+) Teeth Intact, Dental Advisory Given   Pulmonary asthma , sleep apnea and Continuous Positive Airway Pressure Ventilation ,    Pulmonary exam normal breath sounds clear to auscultation       Cardiovascular hypertension, + CAD, + Past MI, + CABG, +CHF and + DVT  Normal cardiovascular exam Rhythm:Regular Rate:Normal  Echo 06/06/20: 1. Left ventricular ejection fraction, by estimation, is 60 to 65%. The  left ventricle has normal function. The left ventricle has no regional  wall motion abnormalities. There is mild concentric left ventricular  hypertrophy and severe basal septal  hypertrophy.  2. Right ventricular systolic function is normal. The right ventricular  size is mildly enlarged. There is moderately elevated pulmonary artery  systolic pressure. The estimated right ventricular systolic pressure is  27.0 mmHg.  3. The mitral valve is normal in structure. Mild mitral valve  regurgitation. No evidence of mitral stenosis.  4. The aortic valve is normal in structure. Aortic valve regurgitation is  not visualized. No aortic stenosis is present.  5. The inferior vena cava is normal in size with <50% respiratory  variability, suggesting right atrial pressure of 8 mmHg.  6. Aortic dilatation noted. There is mild dilatation of the aortic root,  measuring 40 mm. There is mild dilatation of the ascending aorta,  measuring 39 mm.  7. Right atrial size was moderately dilated.  8. Left atrial size was mildly dilated.  9. A small to mdoerate pericardial effusion is present. The pericardial  effusion is circumferential but more prominent posteriorly.     Neuro/Psych PSYCHIATRIC DISORDERS Depression negative neurological ROS     GI/Hepatic Neg liver ROS, GERD  Medicated,  Endo/Other  diabetes, Type 2Obesity   Renal/GU ESRF and DialysisRenal disease (TTHSAT)     Musculoskeletal negative musculoskeletal ROS (+)   Abdominal   Peds  Hematology negative hematology ROS (+)   Anesthesia Other Findings Day of surgery medications reviewed with the patient.  Reproductive/Obstetrics                             Anesthesia Physical Anesthesia Plan  ASA: 3  Anesthesia Plan: General   Post-op Pain Management:    Induction: Intravenous  PONV Risk Score and Plan: 2 and Ondansetron and Dexamethasone  Airway Management Planned: LMA  Additional Equipment:   Intra-op Plan:   Post-operative Plan: Extubation in OR  Informed Consent: I have reviewed the patients History and Physical, chart, labs and discussed the procedure including the risks, benefits and alternatives for the proposed anesthesia with the patient or authorized representative who has indicated his/her understanding and acceptance.     Dental advisory given  Plan Discussed with: CRNA  Anesthesia Plan Comments:         Anesthesia Quick Evaluation

## 2020-10-09 NOTE — Interval H&P Note (Signed)
History and Physical Interval Note:  10/09/2020 9:41 AM  Travis Blair  has presented today for surgery, with the diagnosis of COMPLICATIONS OF AVF.  The various methods of treatment have been discussed with the patient and family. After consideration of risks, benefits and other options for treatment, the patient has consented to  Procedure(s): LIGATION OF 2 LARGE COMPETING BRANCHES OF LEFT BRACHIOCEPHALIC FISTULA (Left) FISTULA SUPERFICIALIZATION (Left) as a surgical intervention.  The patient's history has been reviewed, patient examined, no change in status, stable for surgery.  I have reviewed the patient's chart and labs.  Questions were answered to the patient's satisfaction.     [name]

## 2020-10-09 NOTE — Op Note (Signed)
    NAME: Travis Blair    MRN: [id number] DOB: 16-Jul-1950    DATE OF OPERATION: 10/09/2020  PREOP DIAGNOSIS:    Competing branches left brachiocephalic fistula  POSTOP DIAGNOSIS:    Same  PROCEDURE:    Ligation of multiple competing branches of left brachiocephalic fistula Superficialization of left brachiocephalic fistula  SURGEON: Judeth Cornfield. Scot Dock, MD  ASSIST: Liana Crocker, PA  ANESTHESIA: General  EBL: Minimal  INDICATIONS:    Travis Blair is a 70 y.o. male who has a left brachiocephalic fistula that had multiple competing branches.  The vein was also deep.  He presents for revision.  FINDINGS:   There were 3 large branches and several smaller branches which were all ligated.  TECHNIQUE:   The patient was taken to the operating room and received a general anesthetic.  I looked at the vein myself with the SonoSite and identified the 3 largest branches.  The left arm was prepped and draped in usual sterile fashion.  I made an incision over the proximal fistula where the first larger branch was identified.  The vein was completely dissected free here in this branch ligated and divided between 2-0 silk ties.  I fully mobilized the vein and excised adipose tissue overlying the vein in order to help superficialize the vein.  I used 2 additional incisions in the upper arm to fully mobilized the vein up towards the shoulder.  The other 2 large branches were divided between 2-0 silk ties.  There were multiple smaller branches were also divided between 3-0 silk ties.  The vein was fully mobilized.  Again I excised adipose tissue overlying the vein in order to help superficialize the vein.  Hemostasis was obtained in these wounds.  Each of these wounds was closed with a 4-0 Monocryl.  Dermabond was applied.  The patient tolerated the procedure well was transferred to recovery room in stable condition.  All needle and sponge counts were correct.  Given the complexity of  the case a first assistant was necessary in order to expedient the procedure and safely perform the technical aspects of the operation.  [name], MD, FACS [facility]  DATE OF DICTATION:   10/09/2020

## 2020-10-09 NOTE — Progress Notes (Signed)
I agree with the above note, plan. High risk for any procedures certainly.

## 2020-10-09 NOTE — Discharge Instructions (Signed)
   [facility]  Discharge Instructions  AV Fistula or Graft Surgery for Dialysis Access  Please refer to the following instructions for your post-procedure care. Your surgeon or physician assistant will discuss any changes with you.  Activity  You may drive the day following your surgery, if you are comfortable and no longer taking prescription pain medication. Resume full activity as the soreness in your incision resolves.  Bathing/Showering  You may shower after you go home. Keep your incision dry for 48 hours. Do not soak in a bathtub, hot tub, or swim until the incision heals completely. You may not shower if you have a hemodialysis catheter.  Incision Care  Clean your incision with mild soap and water after 48 hours. Pat the area dry with a clean towel. You do not need a bandage unless otherwise instructed. Do not apply any ointments or creams to your incision. You may have skin glue on your incision. Do not peel it off. It will come off on its own in about one week. Your arm may swell a bit after surgery. To reduce swelling use pillows to elevate your arm so it is above your heart. Your doctor will tell you if you need to lightly wrap your arm with an ACE bandage.  Diet  Resume your normal diet. There are not special food restrictions following this procedure. In order to heal from your surgery, it is CRITICAL to get adequate nutrition. Your body requires vitamins, minerals, and protein. Vegetables are the best source of vitamins and minerals. Vegetables also provide the perfect balance of protein. Processed food has little nutritional value, so try to avoid this.  Medications  Resume taking all of your medications. If your incision is causing pain, you may take over-the counter pain relievers such as acetaminophen (Tylenol). If you were prescribed a stronger pain medication, please be aware these medications can cause nausea and constipation. Prevent  nausea by taking the medication with a snack or meal. Avoid constipation by drinking plenty of fluids and eating foods with high amount of fiber, such as fruits, vegetables, and grains.  Do not take Tylenol if you are taking prescription pain medications.  Follow up Your surgeon may want to see you in the office following your access surgery. If so, this will be arranged at the time of your surgery.  Please call us immediately for any of the following conditions:  Increased pain, redness, drainage (pus) from your incision site Fever of 101 degrees or higher Severe or worsening pain at your incision site Hand pain or numbness.  Reduce your risk of vascular disease:  Stop smoking. If you would like help, call [facility] at 1-800-QUIT-NOW 6058353143) or [place] at [place] your cholesterol Maintain a desired weight Control your diabetes Keep your blood pressure down  Dialysis  It will take several weeks to several months for your new dialysis access to be ready for use. Your surgeon will determine when it is okay to use it. Your nephrologist will continue to direct your dialysis. You can continue to use your Permcath until your new access is ready for use.   10/09/2020 Travis Blair [id number] [birth year]  Surgeon(s): Angelia Mould, MD  Procedure(s): LIGATION OF 2 LARGE COMPETING BRANCHES OF LEFT BRACHIOCEPHALIC FISTULA FISTULA SUPERFICIALIZATION  x Do not stick fistula for 8 weeks    If you have any questions, please call the office at [phone].

## 2020-10-10 ENCOUNTER — Ambulatory Visit: Payer: PPO | Admitting: Podiatry

## 2020-10-10 ENCOUNTER — Encounter (HOSPITAL_COMMUNITY): Payer: Self-pay | Admitting: Vascular Surgery

## 2020-10-10 DIAGNOSIS — D509 Iron deficiency anemia, unspecified: Secondary | ICD-10-CM | POA: Diagnosis not present

## 2020-10-10 DIAGNOSIS — D689 Coagulation defect, unspecified: Secondary | ICD-10-CM | POA: Diagnosis not present

## 2020-10-10 DIAGNOSIS — Z992 Dependence on renal dialysis: Secondary | ICD-10-CM | POA: Diagnosis not present

## 2020-10-10 DIAGNOSIS — N186 End stage renal disease: Secondary | ICD-10-CM | POA: Diagnosis not present

## 2020-10-10 DIAGNOSIS — N2581 Secondary hyperparathyroidism of renal origin: Secondary | ICD-10-CM | POA: Diagnosis not present

## 2020-10-10 NOTE — Anesthesia Postprocedure Evaluation (Signed)
Anesthesia Post Note  Patient: Travis Blair  Procedure(s) Performed: LIGATION OF 2 LARGE COMPETING BRANCHES OF LEFT BRACHIOCEPHALIC FISTULA (Left) FISTULA SUPERFICIALIZATION (Left)     Patient location during evaluation: PACU Anesthesia Type: General Level of consciousness: awake and alert Pain management: pain level controlled Vital Signs Assessment: post-procedure vital signs reviewed and stable Respiratory status: spontaneous breathing, nonlabored ventilation, respiratory function stable and patient connected to nasal cannula oxygen Cardiovascular status: blood pressure returned to baseline and stable Postop Assessment: no apparent nausea or vomiting Anesthetic complications: no Comments: Patient denies chest pain in PACU. EKG at baseline.   No notable events documented.  Last Vitals:  Vitals:   10/09/20 1233 10/09/20 1248  BP: 128/71 (!) 141/74  Pulse: 78 66  Resp: 18 (!) 27  Temp:    SpO2: 97% 92%    Last Pain:  Vitals:   10/09/20 1233  TempSrc:   PainSc: 0-No pain                 Catalina Gravel

## 2020-10-11 DIAGNOSIS — Z992 Dependence on renal dialysis: Secondary | ICD-10-CM | POA: Diagnosis not present

## 2020-10-11 DIAGNOSIS — I129 Hypertensive chronic kidney disease with stage 1 through stage 4 chronic kidney disease, or unspecified chronic kidney disease: Secondary | ICD-10-CM | POA: Diagnosis not present

## 2020-10-11 DIAGNOSIS — N186 End stage renal disease: Secondary | ICD-10-CM | POA: Diagnosis not present

## 2020-10-12 DIAGNOSIS — D689 Coagulation defect, unspecified: Secondary | ICD-10-CM | POA: Diagnosis not present

## 2020-10-12 DIAGNOSIS — N2581 Secondary hyperparathyroidism of renal origin: Secondary | ICD-10-CM | POA: Diagnosis not present

## 2020-10-12 DIAGNOSIS — Z992 Dependence on renal dialysis: Secondary | ICD-10-CM | POA: Diagnosis not present

## 2020-10-12 DIAGNOSIS — D631 Anemia in chronic kidney disease: Secondary | ICD-10-CM | POA: Diagnosis not present

## 2020-10-12 DIAGNOSIS — N186 End stage renal disease: Secondary | ICD-10-CM | POA: Diagnosis not present

## 2020-10-13 ENCOUNTER — Other Ambulatory Visit: Payer: Self-pay

## 2020-10-13 ENCOUNTER — Ambulatory Visit: Payer: PPO | Admitting: Podiatry

## 2020-10-13 DIAGNOSIS — B351 Tinea unguium: Secondary | ICD-10-CM | POA: Diagnosis not present

## 2020-10-13 DIAGNOSIS — L97511 Non-pressure chronic ulcer of other part of right foot limited to breakdown of skin: Secondary | ICD-10-CM

## 2020-10-13 DIAGNOSIS — E1169 Type 2 diabetes mellitus with other specified complication: Secondary | ICD-10-CM

## 2020-10-13 DIAGNOSIS — E1151 Type 2 diabetes mellitus with diabetic peripheral angiopathy without gangrene: Secondary | ICD-10-CM | POA: Diagnosis not present

## 2020-10-17 DIAGNOSIS — N2581 Secondary hyperparathyroidism of renal origin: Secondary | ICD-10-CM | POA: Diagnosis not present

## 2020-10-17 DIAGNOSIS — N186 End stage renal disease: Secondary | ICD-10-CM | POA: Diagnosis not present

## 2020-10-17 DIAGNOSIS — D689 Coagulation defect, unspecified: Secondary | ICD-10-CM | POA: Diagnosis not present

## 2020-10-17 DIAGNOSIS — Z992 Dependence on renal dialysis: Secondary | ICD-10-CM | POA: Diagnosis not present

## 2020-10-17 DIAGNOSIS — D509 Iron deficiency anemia, unspecified: Secondary | ICD-10-CM | POA: Diagnosis not present

## 2020-10-18 DIAGNOSIS — K5900 Constipation, unspecified: Secondary | ICD-10-CM | POA: Diagnosis not present

## 2020-10-18 DIAGNOSIS — I4891 Unspecified atrial fibrillation: Secondary | ICD-10-CM | POA: Diagnosis not present

## 2020-10-18 DIAGNOSIS — E1151 Type 2 diabetes mellitus with diabetic peripheral angiopathy without gangrene: Secondary | ICD-10-CM | POA: Diagnosis not present

## 2020-10-18 DIAGNOSIS — E669 Obesity, unspecified: Secondary | ICD-10-CM | POA: Diagnosis not present

## 2020-10-18 DIAGNOSIS — Z86711 Personal history of pulmonary embolism: Secondary | ICD-10-CM | POA: Diagnosis not present

## 2020-10-18 DIAGNOSIS — Z86718 Personal history of other venous thrombosis and embolism: Secondary | ICD-10-CM | POA: Diagnosis not present

## 2020-10-18 DIAGNOSIS — I5033 Acute on chronic diastolic (congestive) heart failure: Secondary | ICD-10-CM | POA: Diagnosis not present

## 2020-10-18 DIAGNOSIS — I132 Hypertensive heart and chronic kidney disease with heart failure and with stage 5 chronic kidney disease, or end stage renal disease: Secondary | ICD-10-CM | POA: Diagnosis not present

## 2020-10-18 DIAGNOSIS — Z955 Presence of coronary angioplasty implant and graft: Secondary | ICD-10-CM | POA: Diagnosis not present

## 2020-10-18 DIAGNOSIS — E785 Hyperlipidemia, unspecified: Secondary | ICD-10-CM | POA: Diagnosis not present

## 2020-10-18 DIAGNOSIS — G4733 Obstructive sleep apnea (adult) (pediatric): Secondary | ICD-10-CM | POA: Diagnosis not present

## 2020-10-18 DIAGNOSIS — Z992 Dependence on renal dialysis: Secondary | ICD-10-CM | POA: Diagnosis not present

## 2020-10-18 DIAGNOSIS — I872 Venous insufficiency (chronic) (peripheral): Secondary | ICD-10-CM | POA: Diagnosis not present

## 2020-10-18 DIAGNOSIS — N186 End stage renal disease: Secondary | ICD-10-CM | POA: Diagnosis not present

## 2020-10-18 DIAGNOSIS — I272 Pulmonary hypertension, unspecified: Secondary | ICD-10-CM | POA: Diagnosis not present

## 2020-10-18 DIAGNOSIS — L97819 Non-pressure chronic ulcer of other part of right lower leg with unspecified severity: Secondary | ICD-10-CM | POA: Diagnosis not present

## 2020-10-18 DIAGNOSIS — E1122 Type 2 diabetes mellitus with diabetic chronic kidney disease: Secondary | ICD-10-CM | POA: Diagnosis not present

## 2020-10-18 DIAGNOSIS — L97829 Non-pressure chronic ulcer of other part of left lower leg with unspecified severity: Secondary | ICD-10-CM | POA: Diagnosis not present

## 2020-10-18 DIAGNOSIS — Z79899 Other long term (current) drug therapy: Secondary | ICD-10-CM | POA: Diagnosis not present

## 2020-10-18 DIAGNOSIS — D631 Anemia in chronic kidney disease: Secondary | ICD-10-CM | POA: Diagnosis not present

## 2020-10-18 DIAGNOSIS — I2510 Atherosclerotic heart disease of native coronary artery without angina pectoris: Secondary | ICD-10-CM | POA: Diagnosis not present

## 2020-10-18 DIAGNOSIS — Z6834 Body mass index (BMI) 34.0-34.9, adult: Secondary | ICD-10-CM | POA: Diagnosis not present

## 2020-10-18 DIAGNOSIS — I252 Old myocardial infarction: Secondary | ICD-10-CM | POA: Diagnosis not present

## 2020-10-18 DIAGNOSIS — S90921D Unspecified superficial injury of right foot, subsequent encounter: Secondary | ICD-10-CM | POA: Diagnosis not present

## 2020-10-18 DIAGNOSIS — Z951 Presence of aortocoronary bypass graft: Secondary | ICD-10-CM | POA: Diagnosis not present

## 2020-10-23 NOTE — Progress Notes (Signed)
  Subjective:  Patient ID: Travis Blair, male    DOB: 05-30-1950,  MRN: [id number]  Chief Complaint  Patient presents with   Wound Check    Pt states improvement and healing   Nail Problem    Nail trim   70 y.o. male presents with the above complaint. History confirmed with patient.   Objective:  Physical Exam: warm, good capillary refill, nail exam onychomycosis of the toenails, no trophic changes or ulcerative lesions. DP pulses palpable, PT pulses palpable and protective sensation absent. Legs wrapped bilaterally Right Foot: healing ulcer right 5th met head - 0.3x0.3, no warmth, erythema, SOI.    No images are attached to the encounter.  Assessment:   1. Ulcer of right foot, limited to breakdown of skin ([place])   2. Onychomycosis of multiple toenails with type 2 diabetes mellitus and peripheral angiopathy ([place])     Plan:  Patient was evaluated and treated and all questions answered.  Venous leg ulcer legs -Continue compression dressings, not removed today.  Ulcer right foot, heel abrasion left -Continues to improve. Again minimally debrided and dressed with silvadene and bandage  Oncyhomycosis, DM , DPN, CKD on HD -Nails debrided x10  Procedure: Nail Debridement Type of Debridement: manual, sharp debridement. Instrumentation: Nail nipper, rotary burr. Number of Nails: 10  No follow-ups on file.

## 2020-10-24 DIAGNOSIS — D509 Iron deficiency anemia, unspecified: Secondary | ICD-10-CM | POA: Diagnosis not present

## 2020-10-24 DIAGNOSIS — N186 End stage renal disease: Secondary | ICD-10-CM | POA: Diagnosis not present

## 2020-10-24 DIAGNOSIS — Z23 Encounter for immunization: Secondary | ICD-10-CM | POA: Diagnosis not present

## 2020-10-24 DIAGNOSIS — Z992 Dependence on renal dialysis: Secondary | ICD-10-CM | POA: Diagnosis not present

## 2020-10-24 DIAGNOSIS — D631 Anemia in chronic kidney disease: Secondary | ICD-10-CM | POA: Diagnosis not present

## 2020-10-24 DIAGNOSIS — D689 Coagulation defect, unspecified: Secondary | ICD-10-CM | POA: Diagnosis not present

## 2020-10-24 DIAGNOSIS — N2581 Secondary hyperparathyroidism of renal origin: Secondary | ICD-10-CM | POA: Diagnosis not present

## 2020-10-25 ENCOUNTER — Ambulatory Visit (INDEPENDENT_AMBULATORY_CARE_PROVIDER_SITE_OTHER): Payer: PPO | Admitting: Vascular Surgery

## 2020-10-25 ENCOUNTER — Other Ambulatory Visit: Payer: Self-pay

## 2020-10-25 ENCOUNTER — Encounter: Payer: Self-pay | Admitting: Vascular Surgery

## 2020-10-25 VITALS — BP 158/73 | HR 70 | Temp 96.8°F | Ht 73.0 in | Wt 270.0 lb

## 2020-10-25 DIAGNOSIS — N186 End stage renal disease: Secondary | ICD-10-CM

## 2020-10-25 NOTE — Progress Notes (Signed)
   [facility] [place]  Patient name: Travis Blair MRN: [id number] DOB: 02/18/1950 Sex: male  REASON FOR VISIT: Follow-up revision of left arm AV fistula  HPI: Travis Blair is a 70 y.o. male here today for follow-up.  Had initial creation of left brachiocephalic fistula creation by myself at [facility] on 05/04/2020.  The dialysis staff are having difficulty with access and he underwent a outpatient fistulogram with Dr. Scot Dock on 09/29/2020.  This showed no evidence of central stenosis.  He did have several large competing branches.  He was also noted that the fistula ran rather deep to the surface of the skin.  On 10/09/2020 he was taken to the operating room by Dr. Scot Dock for ligation of competing branches and superficial mobilization.  He is here today for follow-up  Current Outpatient Medications  Medication Sig Dispense Refill   albuterol (VENTOLIN HFA) 108 (90 Base) MCG/ACT inhaler Inhale 1-2 puffs into the lungs every 6 (six) hours as needed for wheezing or shortness of breath. 18 g 3   calcium acetate (PHOSLO) 667 MG capsule Take 1,334 mg by mouth 3 (three) times daily.     Evolocumab (REPATHA SURECLICK) 010 MG/ML SOAJ Inject 1 pen into the skin every 14 (fourteen) days. 6 mL 3   ferrous sulfate (FERROUSUL) 325 (65 FE) MG tablet Take 1 tablet (325 mg total) by mouth daily with breakfast.  3   fluorouracil (EFUDEX) 5 % cream Apply topically 2 (two) times daily.     fluticasone (FLONASE) 50 MCG/ACT nasal spray Place 2 sprays into both nostrils daily. (Patient taking differently: Place 2 sprays into both nostrils daily as needed for allergies.) 16 g 6   oxyCODONE-acetaminophen (PERCOCET) 5-325 MG tablet Take 1 tablet by mouth every 6 (six) hours as needed for severe pain. 12 tablet 0   pantoprazole (PROTONIX) 40 MG tablet Take 1 tablet (40 mg total) by mouth daily. 30 tablet 3   No current facility-administered  medications for this visit.     PHYSICAL EXAM: There were no vitals filed for this visit.  GENERAL: The patient is a well-nourished male, in no acute distress. The vital signs are documented above. 3 incisions in the left upper arm are all well-healed.  He has an excellent thrill with a very superficial cephalic vein.  The vein diameter is approximately 7 mm.  MEDICAL ISSUES: Status post superficial mobilization and ligation of competing branches left upper arm AV fistula.  Excellent Izabel Chim result.  Would recommend not using his fistula for a total of 2 weeks after his recent revision on 10/09/2020.  Can begin using the fistula in late October.  He still has good function with his catheter   Rosetta Posner, MD FACS [facility] [place] Office Tel [phone]  Note: Portions of this report may have been transcribed using voice recognition software.  Every effort has been made to ensure accuracy; however, inadvertent computerized transcription errors may still be present.

## 2020-10-26 NOTE — Progress Notes (Addendum)
Cardiology Office Note:    Date:  10/27/2020  ID:  Travis Blair, DOB September 22, 1950, MRN [id number]  PCP:  [name], MD   [facility] Providers Cardiologist:  [name], MD     Referring MD: [name], MD   Chief Complaint:  F/u CAD, CHF   Patient Profile:    Travis Blair is a 70 y.o. male with:  Coronary artery disease S/p CABG 02/2017 (post op AFib >> Eliquis, Amiodarone) (HFpEF) heart failure with preserved ejection fraction  Pulmonary hypertension  RHC 5/22: mod pulm HTN 2/2 vol overload and high output state due AVF (pulm venous htn) Hx of recurrent DVT Tx w/ anticoagulation with Eliquis ESRD  Hypertension  Hyperlipidemia  Rosuvastatin caused leg cramps +[id number] Diabetes Mellitus OSA Hx of COVID-19 in 8/21 Anemia   Prior CV studies:  RIGHT HEART CATH 07/07/2020 Narrative Findings: RA = 12 RV = 72/11 PA = 73/21 (41) PCW = 27 Fick cardiac output/index = 7.1/2.8 Thermo = 8.2/3.2 PVR = 2.0 (fick) 1.7 (Thermo) Ao sat = 99% PA sat = 63%, 63%  Assessment: 1. Moderate pulmonary HTN due primarily to fluid overload (pulmonary venous HTN) and high output state from AVF. ([place] not amenable to pulmonary vasodilators)  Echocardiogram 06/06/20 EF 60-65, no RWMA, mild LVH, severe basal septal hypertrophy, normal RVSF, RVSP 57.3, mild MR, aortic root 40 mm, ascending aorta 39 mm, moderate RAE, mild LAE, small-moderate pericardial effusion  Echocardiogram 12/16/19 EF 45, global HK, GRII DD, RVSP 83.2, mild BAE, mild MR, ascending aorta 41 mm  Carotid US 03/04/17 Bilateral ICA 1-39   Cardiac Catheterization 12/20/16 LAD prox 90, 95, 60 LCx prox 60, 99, 95; OM1 70 RCA mid 95, 95, dist 60; RPDA ost 60; RPAVB 90, side branch 90   Myoview 12/02/16 Lg infarct with mod peri-infarct ischemia in RCA/LCx territory; EF 41 High Risk study   Echo 11/17/16 Mild LVH, EF 50-55, no RWMA, Gr 2 DD, mild LAE, mod RVE, severe RAE  History of Present  Illness: Mr. Wiler was last seen in 2/22.  He returns for f/u.  He is here alone.  He has been having issues with his AV fistula.  He continues on dialysis Tuesday, Thursday, Saturday.  His hemoglobin has improved since he was taken off of anticoagulation.  His most recent hemoglobin was 13.6.  He has not had chest pain.  His breathing is much improved.  His lower extremity edema is also much improved.  He has not had orthopnea, syncope.        Past Medical History:  Diagnosis Date   Anemia associated with chronic renal failure basically   Asthma    as a child   CHF (congestive heart failure) ([place])    Chronic kidney disease    Sees Dr [name]   Constipation    Coronary artery disease    COVID-19    Depression    Diabetes mellitus without complication ([place])    DVT (deep venous thrombosis) ([place])    History of blood clots    Hyperlipidemia    Hypertension    Leg pain    ABIs/LE Arterial US 03/2019: ABIs unreliable; +calcification, no evidence of stenosis   Myocardial infarction [facility])    Obesity    Pneumonia 2018   Pulmonary embolism ([facility])    Sleep apnea    CPAP   SOB (shortness of breath)    Swelling of both lower extremities    Current Medications: Current Meds  Medication Sig  albuterol (VENTOLIN HFA) 108 (90 Base) MCG/ACT inhaler Inhale 1-2 puffs into the lungs every 6 (six) hours as needed for wheezing or shortness of breath.   calcium acetate (PHOSLO) 667 MG capsule Take 1,334 mg by mouth 3 (three) times daily.   Evolocumab (REPATHA SURECLICK) 151 MG/ML SOAJ Inject 1 pen into the skin every 14 (fourteen) days.   ferrous sulfate (FERROUSUL) 325 (65 FE) MG tablet Take 1 tablet (325 mg total) by mouth daily with breakfast.   fluorouracil (EFUDEX) 5 % cream Apply topically 2 (two) times daily.   fluticasone (FLONASE) 50 MCG/ACT nasal spray Place 2 sprays into both nostrils daily. (Patient taking differently: Place 2 sprays into both nostrils daily as needed for allergies.)    pantoprazole (PROTONIX) 40 MG tablet Take 1 tablet (40 mg total) by mouth daily.    Allergies:   Latex and Lipitor [atorvastatin]   Social History   Tobacco Use   Smoking status: Never   Smokeless tobacco: Never  Vaping Use   Vaping Use: Never used  Substance Use Topics   Alcohol use: No   Drug use: No    Family Hx: The patient's family history includes Cancer in his father; Congenital heart disease in his father; Diabetes in his mother; Heart disease in his father and mother; Hyperlipidemia in his father; Hypertension in his father; Lymphoma in his sister; Multiple myeloma in his mother.  Review of Systems  Gastrointestinal:  Negative for hematochezia and melena.    EKGs/Labs/Other Test Reviewed:    EKG:  EKG is not ordered today.  The ekg ordered today demonstrates n/a  Recent Labs: 09/01/2020: ALT 15; B Natriuretic Peptide 1,761.4 09/08/2020: Platelets 214 10/09/2020: BUN 55; Creatinine, Ser 5.20; Hemoglobin 13.6; Potassium 4.4; Sodium 135   Recent Lipid Panel Lab Results  Component Value Date/Time   CHOL 142 03/29/2020 11:17 AM   TRIG 111 03/29/2020 11:17 AM   HDL 44 03/29/2020 11:17 AM   LDLCALC 78 03/29/2020 11:17 AM   LDLDIRECT 122 (H) 09/21/2018 07:28 AM     Risk Assessment/Calculations:    CHA2DS2-VASc Score = 5   This indicates a 7.2% annual risk of stroke. The patient's score is based upon: CHF History: 1 HTN History: 1 Diabetes History: 1 Stroke History: 0 Vascular Disease History: 1 Age Score: 1 Gender Score: 0       Physical Exam:   VS:  BP 124/64 (BP Location: Right Arm, Patient Position: Sitting, Cuff Size: Normal)   Pulse 67   Ht 6' 1.5" (1.867 m)   Wt 268 lb 12.8 oz (121.9 kg)   SpO2 100%   BMI 34.98 kg/m     Wt Readings from Last 3 Encounters:  10/27/20 268 lb 12.8 oz (121.9 kg)  10/25/20 270 lb (122.5 kg)  10/09/20 270 lb (122.5 kg)    Constitutional:      Appearance: Healthy appearance. Not in distress.  Pulmonary:     Effort:  Pulmonary effort is normal.     Breath sounds: No wheezing. No rales.  Cardiovascular:     Normal rate. Regular rhythm. Normal S1. Normal S2.      Murmurs: There is no murmur.  Edema:    Peripheral edema present.    Pretibial: bilateral 1+ edema of the pretibial area.    Ankle: bilateral 1+ edema of the ankle.    Comments: Legs with chronic stasis changes Abdominal:     Palpations: Abdomen is soft.  Musculoskeletal:     Cervical back: Neck supple. Skin:  General: Skin is warm and dry.  Neurological:     General: No focal deficit present.     Mental Status: Alert and oriented to person, place and time.     Cranial Nerves: Cranial nerves are intact.      ASSESSMENT & PLAN:   1. Coronary artery disease involving native coronary artery of native heart without angina pectoris History of CABG in 2019.  He is doing well without anginal symptoms.  He was not on antiplatelet therapy because he was on anticoagulation with Apixaban.  However, he is now off of anticoagulation.  I will discuss further with Dr. Acie Fredrickson whether or not we should place him on aspirin or possibly clopidogrel.  I did discuss this with the patient today.  Continue evolocumab.  Follow-up in 6 months.  2. Chronic heart failure with preserved ejection fraction ([place]) EF 60-65 by echocardiogram 4/22.  NYHA IIb.  Volume is managed by dialysis.  3. ESRD (end stage renal disease) ([place]) He remains on Tuesday, Thursday, Saturday dialysis.  4. Other secondary pulmonary hypertension ([place]) Right heart cath in 5/22.  This was mainly related to volume overload.  5. Essential hypertension Blood pressure is well controlled.  He does not currently take any medications.    6. Mixed hyperlipidemia He is intolerant to statins.  He remains on evolocumab.  7. History of deep venous thrombosis He is no longer on anticoagulation due to heme positive anemia.  Dispo:  Return in about 6 months (around 04/26/2021) for Routine Follow Up, w/  Dr. Acie Fredrickson.   Medication Adjustments/Labs and Tests Ordered: Current medicines are reviewed at length with the patient today.  Concerns regarding medicines are outlined above.  Tests Ordered: No orders of the defined types were placed in this encounter.  Medication Changes: No orders of the defined types were placed in this encounter.  Signed, Richardson Dopp, PA-C  10/27/2020 10:31 AM    [facility], [place], [place]  [address] Phone: [phone]; Fax: [phone]

## 2020-10-27 ENCOUNTER — Other Ambulatory Visit: Payer: Self-pay

## 2020-10-27 ENCOUNTER — Ambulatory Visit: Payer: PPO | Admitting: Physician Assistant

## 2020-10-27 ENCOUNTER — Encounter: Payer: Self-pay | Admitting: Physician Assistant

## 2020-10-27 VITALS — BP 124/64 | HR 67 | Ht 73.5 in | Wt 268.8 lb

## 2020-10-27 DIAGNOSIS — N186 End stage renal disease: Secondary | ICD-10-CM | POA: Diagnosis not present

## 2020-10-27 DIAGNOSIS — I2510 Atherosclerotic heart disease of native coronary artery without angina pectoris: Secondary | ICD-10-CM | POA: Diagnosis not present

## 2020-10-27 DIAGNOSIS — Z86718 Personal history of other venous thrombosis and embolism: Secondary | ICD-10-CM

## 2020-10-27 DIAGNOSIS — I5032 Chronic diastolic (congestive) heart failure: Secondary | ICD-10-CM | POA: Diagnosis not present

## 2020-10-27 DIAGNOSIS — I10 Essential (primary) hypertension: Secondary | ICD-10-CM | POA: Diagnosis not present

## 2020-10-27 DIAGNOSIS — I2729 Other secondary pulmonary hypertension: Secondary | ICD-10-CM

## 2020-10-27 DIAGNOSIS — E782 Mixed hyperlipidemia: Secondary | ICD-10-CM

## 2020-10-27 NOTE — Patient Instructions (Signed)
Medication Instructions:  Your physician recommends that you continue on your current medications as directed. Please refer to the Current Medication list given to you today.  *If you need a refill on your cardiac medications before your next appointment, please call your pharmacy*  Lab Work:  -NONE  If you have labs (blood work) drawn today and your tests are completely normal, you will receive your results only by: [place] (if you have MyChart) OR A paper copy in the mail If you have any lab test that is abnormal or we need to change your treatment, we will call you to review the results.  Testing/Procedures:  -NONE  Follow-Up: At [facility], you and your health needs are our priority.  As part of our continuing mission to provide you with exceptional heart care, we have created designated Provider Care Teams.  These Care Teams include your primary Cardiologist (physician) and Advanced Practice Providers (APPs -  Physician Assistants and Nurse Practitioners) who all work together to provide you with the care you need, when you need it.  We recommend signing up for the patient portal called "MyChart".  Sign up information is provided on this After Visit Summary.  MyChart is used to connect with patients for Virtual Visits (Telemedicine).  Patients are able to view lab/test results, encounter notes, upcoming appointments, etc.  Non-urgent messages can be sent to your provider as well.   To learn more about what you can do with MyChart, go to [email or web address].    Your next appointment:   6 month(s)  The format for your next appointment:   In Person  Provider:   [name], MD   Other Instructions

## 2020-10-31 DIAGNOSIS — N186 End stage renal disease: Secondary | ICD-10-CM | POA: Diagnosis not present

## 2020-10-31 DIAGNOSIS — D509 Iron deficiency anemia, unspecified: Secondary | ICD-10-CM | POA: Diagnosis not present

## 2020-10-31 DIAGNOSIS — N2581 Secondary hyperparathyroidism of renal origin: Secondary | ICD-10-CM | POA: Diagnosis not present

## 2020-10-31 DIAGNOSIS — E119 Type 2 diabetes mellitus without complications: Secondary | ICD-10-CM | POA: Diagnosis not present

## 2020-10-31 DIAGNOSIS — Z23 Encounter for immunization: Secondary | ICD-10-CM | POA: Diagnosis not present

## 2020-10-31 DIAGNOSIS — D689 Coagulation defect, unspecified: Secondary | ICD-10-CM | POA: Diagnosis not present

## 2020-10-31 DIAGNOSIS — Z992 Dependence on renal dialysis: Secondary | ICD-10-CM | POA: Diagnosis not present

## 2020-11-01 ENCOUNTER — Other Ambulatory Visit: Payer: Self-pay

## 2020-11-01 ENCOUNTER — Ambulatory Visit (INDEPENDENT_AMBULATORY_CARE_PROVIDER_SITE_OTHER): Payer: PPO | Admitting: Ophthalmology

## 2020-11-01 ENCOUNTER — Encounter (INDEPENDENT_AMBULATORY_CARE_PROVIDER_SITE_OTHER): Payer: Self-pay | Admitting: Ophthalmology

## 2020-11-01 ENCOUNTER — Encounter (INDEPENDENT_AMBULATORY_CARE_PROVIDER_SITE_OTHER): Payer: PPO | Admitting: Ophthalmology

## 2020-11-01 DIAGNOSIS — E113491 Type 2 diabetes mellitus with severe nonproliferative diabetic retinopathy without macular edema, right eye: Secondary | ICD-10-CM | POA: Diagnosis not present

## 2020-11-01 DIAGNOSIS — E113412 Type 2 diabetes mellitus with severe nonproliferative diabetic retinopathy with macular edema, left eye: Secondary | ICD-10-CM | POA: Diagnosis not present

## 2020-11-01 DIAGNOSIS — E113411 Type 2 diabetes mellitus with severe nonproliferative diabetic retinopathy with macular edema, right eye: Secondary | ICD-10-CM

## 2020-11-01 MED ORDER — BEVACIZUMAB 2.5 MG/0.1ML IZ SOSY
2.5000 mg | PREFILLED_SYRINGE | INTRAVITREAL | Status: AC | PRN
  Administered 2020-11-01: 2.5 mg via INTRAVITREAL

## 2020-11-01 NOTE — Assessment & Plan Note (Signed)
CSME CME component has improved over time will observe OD

## 2020-11-01 NOTE — Progress Notes (Signed)
11/01/2020     CHIEF COMPLAINT Patient presents for  Chief Complaint  Patient presents with   Retina Follow Up      HISTORY OF PRESENT ILLNESS: Travis Blair is a 70 y.o. male who presents to the clinic today for:   HPI     Retina Follow Up   Patient presents with  Diabetic Retinopathy.  In both eyes.  This started 3 months ago.  Severity is severe.  Duration of 3 months.  I, the attending physician,  performed the HPI with the patient and updated documentation appropriately.        Comments   3 month f/u OU with OCT Patient had a bout of hospitalization requiring dialysis for which she lost over 40 pounds.  Now on renal dialysis. Pt denies any visual changes since previous visit. Pt denies any new flashes or floaters. Pt denies any eye pain.  A1c: "right at 7"  Eye Meds: None      Last edited by Travis Crape, MD on 11/01/2020  2:38 PM.      Referring physician: Donita Brooks, MD [address] [place],  [place] [address]  HISTORICAL INFORMATION:   Selected notes from the MEDICAL RECORD NUMBER    Lab Results  Component Value Date   HGBA1C 8.0 (H) 08/18/2020     CURRENT MEDICATIONS: No current outpatient medications on file. (Ophthalmic Drugs)   No current facility-administered medications for this visit. (Ophthalmic Drugs)   Current Outpatient Medications (Other)  Medication Sig   albuterol (VENTOLIN HFA) 108 (90 Base) MCG/ACT inhaler Inhale 1-2 puffs into the lungs every 6 (six) hours as needed for wheezing or shortness of breath.   calcium acetate (PHOSLO) 667 MG capsule Take 1,334 mg by mouth 3 (three) times daily.   Evolocumab (REPATHA SURECLICK) 140 MG/ML SOAJ Inject 1 pen into the skin every 14 (fourteen) days.   ferrous sulfate (FERROUSUL) 325 (65 FE) MG tablet Take 1 tablet (325 mg total) by mouth daily with breakfast.   fluorouracil (EFUDEX) 5 % cream Apply topically 2 (two) times daily.   fluticasone (FLONASE) 50 MCG/ACT nasal  spray Place 2 sprays into both nostrils daily. (Patient taking differently: Place 2 sprays into both nostrils daily as needed for allergies.)   pantoprazole (PROTONIX) 40 MG tablet Take 1 tablet (40 mg total) by mouth daily.   No current facility-administered medications for this visit. (Other)      REVIEW OF SYSTEMS:    ALLERGIES Allergies  Allergen Reactions   Latex Hives and Rash   Lipitor [Atorvastatin]     Leg aches     PAST MEDICAL HISTORY Past Medical History:  Diagnosis Date   Anemia associated with chronic renal failure basically   Asthma    as a child   CHF (congestive heart failure) (HCC)    Chronic kidney disease    Sees Dr Travis Blair   Constipation    Coronary artery disease    COVID-19    Depression    Diabetes mellitus without complication (HCC)    DVT (deep venous thrombosis) (HCC)    History of blood clots    Hyperlipidemia    Hypertension    Leg pain    ABIs/LE Arterial US 03/2019: ABIs unreliable; +calcification, no evidence of stenosis   Myocardial infarction [facility])    Obesity    Pneumonia 2018   Pulmonary embolism (HCC)    Sleep apnea    CPAP   SOB (shortness of  breath)    Swelling of both lower extremities    Past Surgical History:  Procedure Laterality Date   A/V FISTULAGRAM Left 09/29/2020   Procedure: A/V FISTULAGRAM;  Surgeon: Travis Mould, MD;  Location: [place] [facility];  Service: Cardiovascular;  Laterality: Left;   AV FISTULA PLACEMENT Left 05/04/2020   Procedure: LEFT ARM ARTERIOVENOUS (AV) FISTULA CREATION;  Surgeon: Travis Posner, MD;  Location: [facility];  Service: Vascular;  Laterality: Left;   COLONOSCOPY     CORONARY ARTERY BYPASS GRAFT N/A 03/07/2017   Procedure: CORONARY ARTERY BYPASS GRAFTING (CABG), X 3 , USING RIGHT INTERNAL MAMMARY ARTERY, AND RIGHT LEG GREATER SAPHENOUS VEIN HARVESTED ENDOSCOPICALLY;  Surgeon: Travis Poot, MD;  Location: [place];  Service: Open Heart Surgery;  Laterality: N/A;   FISTULA  SUPERFICIALIZATION Left 10/09/2020   Procedure: FISTULA SUPERFICIALIZATION;  Surgeon: Travis Mould, MD;  Location: [facility];  Service: Vascular;  Laterality: Left;   HAND SURGERY Right 1991   IR FLUORO GUIDE CV LINE RIGHT  09/04/2020   IR US GUIDE VASC ACCESS RIGHT  09/04/2020   LIGATION OF COMPETING BRANCHES OF ARTERIOVENOUS FISTULA Left 10/09/2020   Procedure: LIGATION OF 2 LARGE COMPETING BRANCHES OF LEFT BRACHIOCEPHALIC FISTULA;  Surgeon: Travis Mould, MD;  Location: [facility] [place];  Service: Vascular;  Laterality: Left;   RIGHT HEART CATH N/A 07/07/2020   Procedure: RIGHT HEART CATH;  Surgeon: Travis Artist, MD;  Location: [place] [facility];  Service: Cardiovascular;  Laterality: N/A;   RIGHT/LEFT HEART CATH AND CORONARY ANGIOGRAPHY N/A 12/20/2016   Procedure: RIGHT/LEFT HEART CATH AND CORONARY ANGIOGRAPHY;  Surgeon: Travis Mormon, MD;  Location: [place] [facility];  Service: Cardiovascular;  Laterality: N/A;   TEE WITHOUT CARDIOVERSION N/A 03/07/2017   Procedure: TRANSESOPHAGEAL ECHOCARDIOGRAM (TEE);  Surgeon: Travis Blair, Travis Salina, MD;  Location: [place];  Service: Open Heart Surgery;  Laterality: N/A;   TONSILLECTOMY      FAMILY HISTORY Family History  Problem Relation Age of Onset   Heart disease Mother    Diabetes Mother    Multiple myeloma Mother    Cancer Father        type unknown, as a child   Heart disease Father    Hypertension Father    Congenital heart disease Father    Hyperlipidemia Father    Lymphoma Sister     SOCIAL HISTORY Social History   Tobacco Use   Smoking status: Never   Smokeless tobacco: Never  Vaping Use   Vaping Use: Never used  Substance Use Topics   Alcohol use: No   Drug use: No         OPHTHALMIC EXAM:  Base Eye Exam     Visual Acuity (ETDRS)       Right Left   Dist cc 20/30 -2 20/25 -2   Dist ph cc 20/25     Correction: Glasses         Tonometry (Tonopen, 2:05 PM)       Right Left   Pressure 16 17          Pupils       Pupils Dark Light Shape React APD   Right PERRL 5 4 Round Brisk None   Left PERRL 5 4 Round Brisk None         Visual Fields (Counting fingers)       Left Right    Full Full         Extraocular Movement  Right Left    Full, Ortho Full, Ortho         Neuro/Psych     Oriented x3: Yes   Mood/Affect: Normal         Dilation     Both eyes: 1.0% Mydriacyl, 2.5% Phenylephrine @ 2:05 PM           Slit Lamp and Fundus Exam     External Exam       Right Left   External Normal Normal         Slit Lamp Exam       Right Left   Lids/Lashes Normal Normal   Conjunctiva/Sclera White and quiet White and quiet   Cornea Clear Clear   Anterior Chamber Deep and quiet Deep and quiet   Iris Round and reactive Round and reactive   Lens 2+ Nuclear sclerosis 2+ Nuclear sclerosis   Anterior Vitreous Normal Normal         Fundus Exam       Right Left   Posterior Vitreous Normal Normal   Disc Normal Normal   C/D Ratio 0.25 0.25   Macula Macular thickening, Microaneurysms, Mild clinically significant macular edema CSME diffuse with posterior pole cotton-wool spots capillary dropout" swamp edema", Macular thickening, Microaneurysms, Mild clinically significant macular edema   Vessels NPDR-Severe, with severe retinal nonperfusion capillary dropout with glassy opacification of the peripheral retina NPDR-Severe, with severe retinal nonperfusion capillary dropout with glassy opacification of the peripheral retina   Periphery Cotton wool spots Cotton wool spots, PERI papillary in the posterior pole            IMAGING AND PROCEDURES  Imaging and Procedures for 11/01/20  OCT, Retina - OU - Both Eyes       Right Eye Quality was good. Scan locations included subfoveal. Central Foveal Thickness: 312. Progression has improved. Findings include vitreomacular adhesion , cystoid macular edema.   Left Eye Quality was good. Scan locations included  subfoveal. Central Foveal Thickness: 306. Progression has worsened. Findings include vitreomacular adhesion , cystoid macular edema.   Notes Some recurrence of CSME OS still active.  At 77-month follow-up, will restart Avastin OS today  CSME OD improved on intravitreal Avastin,  OS postinjection also post focal laser.  With recurrence of CSME     Intravitreal Injection, Pharmacologic Agent - OS - Left Eye       Time Out 11/01/2020. 2:39 PM. Confirmed correct patient, procedure, site, and patient consented.   Anesthesia Topical anesthesia was used. Anesthetic medications included Akten 3.5%.   Procedure Preparation included Ofloxacin , 10% betadine to eyelids, 5% betadine to ocular surface. A 30 gauge needle was used.   Injection: 2.5 mg bevacizumab 2.5 MG/0.1ML   Route: Intravitreal, Site: Left Eye   NDC: [phone], Lot: [id number]   Post-op Post injection exam found visual acuity of at least counting fingers. The patient tolerated the procedure well. There were no complications. The patient received written and verbal post procedure care education. Post injection medications were not given.              ASSESSMENT/PLAN:  Severe nonproliferative diabetic retinopathy of right eye, with macular edema, associated with type 2 diabetes mellitus (HCC) CSME CME component has improved over time will observe OD  Severe nonproliferative diabetic retinopathy of left eye, with macular edema, associated with type 2 diabetes mellitus ([place]) OS overall stable yet still active CSME.  Slightly worse at the 9-month interval post focal laser.  We will repeat injection  today and reevaluate in 8 weeks     ICD-10-CM   1. Severe nonproliferative diabetic retinopathy of left eye, with macular edema, associated with type 2 diabetes mellitus (HCC)  K53.9767 OCT, Retina - OU - Both Eyes    Intravitreal Injection, Pharmacologic Agent - OS - Left Eye    bevacizumab (AVASTIN) SOSY 2.5 mg    2.  Severe nonproliferative diabetic retinopathy of right eye associated with type 2 diabetes mellitus, macular edema presence unspecified ([place])  E11.3491     3. Severe nonproliferative diabetic retinopathy of right eye, with macular edema, associated with type 2 diabetes mellitus ([place])  E11.3411       1.  OD overall stable.  Will observe  2.  OS with CSME slightly recurrent diffuse macular region.  We will repeat injection Avastin today to stabilize severe NPDR as well as improved CSME OS  3.  Ophthalmic Meds Ordered this visit:  Meds ordered this encounter  Medications   bevacizumab (AVASTIN) SOSY 2.5 mg       Return in about 8 weeks (around 12/27/2020) for DILATE OU, AVASTIN OCT, OS.  There are no Patient Instructions on file for this visit.   Explained the diagnoses, plan, and follow up with the patient and they expressed understanding.  Patient expressed understanding of the importance of proper follow up care.   Travis Blair M.D. Diseases & Surgery of the Retina and Vitreous Retina & Diabetic [place] 11/01/20     Abbreviations: M myopia (nearsighted); A astigmatism; H hyperopia (farsighted); P presbyopia; Mrx spectacle prescription;  CTL contact lenses; OD right eye; OS left eye; OU both eyes  XT exotropia; ET esotropia; PEK punctate epithelial keratitis; PEE punctate epithelial erosions; DES dry eye syndrome; MGD meibomian gland dysfunction; ATs artificial tears; PFAT's preservative free artificial tears; [place] nuclear sclerotic cataract; PSC posterior subcapsular cataract; ERM epi-retinal membrane; PVD posterior vitreous detachment; RD retinal detachment; DM diabetes mellitus; DR diabetic retinopathy; NPDR non-proliferative diabetic retinopathy; PDR proliferative diabetic retinopathy; CSME clinically significant macular edema; DME diabetic macular edema; dbh dot blot hemorrhages; CWS cotton wool spot; POAG primary open angle glaucoma; C/D cup-to-disc ratio; HVF humphrey  visual field; GVF goldmann visual field; OCT optical coherence tomography; IOP intraocular pressure; BRVO Branch retinal vein occlusion; CRVO central retinal vein occlusion; CRAO central retinal artery occlusion; BRAO branch retinal artery occlusion; RT retinal tear; SB scleral buckle; PPV pars plana vitrectomy; VH Vitreous hemorrhage; PRP panretinal laser photocoagulation; IVK intravitreal kenalog; VMT vitreomacular traction; MH Macular hole;  NVD neovascularization of the disc; NVE neovascularization elsewhere; AREDS age related eye disease study; ARMD age related macular degeneration; POAG primary open angle glaucoma; EBMD epithelial/anterior basement membrane dystrophy; ACIOL anterior chamber intraocular lens; IOL intraocular lens; PCIOL posterior chamber intraocular lens; Phaco/IOL phacoemulsification with intraocular lens placement; [place] photorefractive keratectomy; LASIK laser assisted in situ keratomileusis; HTN hypertension; DM diabetes mellitus; COPD chronic obstructive pulmonary disease

## 2020-11-01 NOTE — Assessment & Plan Note (Signed)
OS overall stable yet still active CSME.  Slightly worse at the 43-month interval post focal laser.  We will repeat injection today and reevaluate in 8 weeks

## 2020-11-02 ENCOUNTER — Encounter (INDEPENDENT_AMBULATORY_CARE_PROVIDER_SITE_OTHER): Payer: PPO | Admitting: Ophthalmology

## 2020-11-06 ENCOUNTER — Telehealth: Payer: Self-pay | Admitting: Physician Assistant

## 2020-11-06 ENCOUNTER — Encounter: Payer: Self-pay | Admitting: Podiatry

## 2020-11-06 DIAGNOSIS — N2581 Secondary hyperparathyroidism of renal origin: Secondary | ICD-10-CM | POA: Diagnosis not present

## 2020-11-06 DIAGNOSIS — D689 Coagulation defect, unspecified: Secondary | ICD-10-CM | POA: Diagnosis not present

## 2020-11-06 DIAGNOSIS — D509 Iron deficiency anemia, unspecified: Secondary | ICD-10-CM | POA: Diagnosis not present

## 2020-11-06 DIAGNOSIS — Z992 Dependence on renal dialysis: Secondary | ICD-10-CM | POA: Diagnosis not present

## 2020-11-06 DIAGNOSIS — N186 End stage renal disease: Secondary | ICD-10-CM | POA: Diagnosis not present

## 2020-11-06 DIAGNOSIS — D631 Anemia in chronic kidney disease: Secondary | ICD-10-CM | POA: Diagnosis not present

## 2020-11-06 NOTE — Telephone Encounter (Signed)
Spoke to the patient and gave recommendation about new medication and lab draws. The patient stated he wanted to talk to Dr. Dennard Schaumann first before he moves forward with the new medication. He will call his PCP tomorrow and then give [place] a call back.

## 2020-11-06 NOTE — Telephone Encounter (Signed)
Please call patient. I reviewed his case with Dr. Acie Fredrickson.  I also reached out to Dr. Dennard Schaumann. He is not able to resume Eliquis or Xarelto due to high bleeding risk. For his coronary artery disease, since he is not going to resume anticoagulation, Dr. Acie Fredrickson and I think we should put him on Plavix for secondary prevention.  I will check his blood count often after starting to make sure he does not have any recurrent bleeding.  PLAN:  Start Plavix 75 mg once daily Check CBC every 2 weeks x 3 Richardson Dopp, PA-C    11/06/2020 5:21 PM

## 2020-11-06 NOTE — Telephone Encounter (Signed)
-----   Message from Thayer Headings, MD sent at 11/03/2020  5:14 PM EDT ----- I think plavix is a great idea for now. Thanks  Phil  ----- Message ----- From: Sharmon Revere Sent: 10/27/2020  10:31 AM EDT To: Thayer Headings, MD  Phil - He was taken off anticoagulation due to heme+ stools/anemia.  Hgb now normal.  Should we put him on ASA or Plavix (I was thinking Plavix) given his CAD and not being on anticoagulation now?   Adreanna Fickel

## 2020-11-07 ENCOUNTER — Encounter: Payer: Self-pay | Admitting: Family Medicine

## 2020-11-07 NOTE — Telephone Encounter (Signed)
He has stopped his Eliquis on his own in the past ( I thought it was due to cost at the time).  I have not yet decided that he was not a candidate for anticoagulation and.  He has atrial fib and also has had unprovoked DVT.  He has multiple indications to be on anticoagulation but also has had anemia that has not been clearly sorted out from what I can tell.   It sounds like he has made the decision to not take anticoagulation.  If this is so, we should discuss [place] to help prevent stroke  We may need to consider IVC filter now and  certainly should consider it  if he has another DVT.    Kimyata Milich or Triage team, would you see if he can be worked into my schedule or Scott's schedule to discuss these issues.   Thanks   [employer]

## 2020-11-09 MED ORDER — CLOPIDOGREL BISULFATE 75 MG PO TABS: 75.0000 mg | ORAL_TABLET | Freq: Every day | ORAL | 3 refills | Status: DC

## 2020-11-09 NOTE — Telephone Encounter (Signed)
Called patient who verified that he will resume Plavix 75 mg daily. He requests that I send a Rx to his pharmacy as he does not currently have any on hand. I offered to move his follow-up appointment which is currently scheduled for March with Dr. Acie Fredrickson to a sooner date, but patient declined. I advised him to call back prior to that time with any questions or concerns and he thanked me for the call.

## 2020-11-10 DIAGNOSIS — Z992 Dependence on renal dialysis: Secondary | ICD-10-CM | POA: Diagnosis not present

## 2020-11-10 DIAGNOSIS — I129 Hypertensive chronic kidney disease with stage 1 through stage 4 chronic kidney disease, or unspecified chronic kidney disease: Secondary | ICD-10-CM | POA: Diagnosis not present

## 2020-11-10 DIAGNOSIS — N186 End stage renal disease: Secondary | ICD-10-CM | POA: Diagnosis not present

## 2020-11-11 DIAGNOSIS — Z992 Dependence on renal dialysis: Secondary | ICD-10-CM | POA: Diagnosis not present

## 2020-11-11 DIAGNOSIS — I129 Hypertensive chronic kidney disease with stage 1 through stage 4 chronic kidney disease, or unspecified chronic kidney disease: Secondary | ICD-10-CM | POA: Diagnosis not present

## 2020-11-11 DIAGNOSIS — N186 End stage renal disease: Secondary | ICD-10-CM | POA: Diagnosis not present

## 2020-11-13 DIAGNOSIS — N2581 Secondary hyperparathyroidism of renal origin: Secondary | ICD-10-CM | POA: Diagnosis not present

## 2020-11-13 DIAGNOSIS — Z992 Dependence on renal dialysis: Secondary | ICD-10-CM | POA: Diagnosis not present

## 2020-11-13 DIAGNOSIS — N186 End stage renal disease: Secondary | ICD-10-CM | POA: Diagnosis not present

## 2020-11-13 DIAGNOSIS — D509 Iron deficiency anemia, unspecified: Secondary | ICD-10-CM | POA: Diagnosis not present

## 2020-11-13 DIAGNOSIS — D689 Coagulation defect, unspecified: Secondary | ICD-10-CM | POA: Diagnosis not present

## 2020-11-14 ENCOUNTER — Other Ambulatory Visit: Payer: Self-pay

## 2020-11-14 ENCOUNTER — Encounter: Payer: Self-pay | Admitting: Podiatry

## 2020-11-14 ENCOUNTER — Ambulatory Visit: Payer: PPO | Admitting: Podiatry

## 2020-11-14 DIAGNOSIS — E11621 Type 2 diabetes mellitus with foot ulcer: Secondary | ICD-10-CM | POA: Diagnosis not present

## 2020-11-14 DIAGNOSIS — L97511 Non-pressure chronic ulcer of other part of right foot limited to breakdown of skin: Secondary | ICD-10-CM | POA: Diagnosis not present

## 2020-11-14 NOTE — Progress Notes (Signed)
  Subjective:  Patient ID: Travis Blair, male    DOB: 1950-04-29,   MRN: [id number]  Chief Complaint  Patient presents with   Foot Ulcer    Right foot ulcer     70 y.o. male presents for right foot ulcer that he has had for several months and been monitored by Dr. March Rummage. Here today to have evaluated. Has been dressing with  silvadene and DSD. No new concerns. Denies any other pedal complaints. Denies n/v/f/c.   Past Medical History:  Diagnosis Date   Anemia associated with chronic renal failure basically   Asthma    as a child   CHF (congestive heart failure) ([place])    Chronic kidney disease    Sees Dr [name]   Constipation    Coronary artery disease    COVID-19    Depression    Diabetes mellitus without complication ([place])    DVT (deep venous thrombosis) ([place])    History of blood clots    Hyperlipidemia    Hypertension    Leg pain    ABIs/LE Arterial US 03/2019: ABIs unreliable; +calcification, no evidence of stenosis   Myocardial infarction [facility])    Obesity    Pneumonia 2018   Pulmonary embolism (HCC)    Sleep apnea    CPAP   SOB (shortness of breath)    Swelling of both lower extremities     Objective:  Physical Exam: Vascular: DP/PT pulses 2/4 bilateral. CFT <3 seconds. Normal hair growth on digits. No edema.  Skin. No lacerations or abrasions bilateral feet.  Ulcer measuring 0.2 x 0.4 cm x superficial sub fifth metatarsal of right foot. Granular wound base with no erythema edema or purulence noted.  Musculoskeletal: MMT 5/5 bilateral lower extremities in DF, PF, Inversion and Eversion. Deceased ROM in DF of ankle joint.  Neurological: Sensation intact to light touch.   Assessment:   1. Ulcer of right foot, limited to breakdown of skin ([place])   2. Type 2 diabetes mellitus with foot ulcer (CODE) ([place])      Plan:  Patient was evaluated and treated and all questions answered. Ulcer right foot  -Debridement as below. -Dressed with silvadene offloading pad,  DSD.  Procedure: Excisional Debridement of Wound Rationale: Removal of non-viable soft tissue from the wound to promote healing.  Anesthesia: none Pre-Debridement Wound Measurements: 0.2 cm x 0.3 cm x superficial cm  Post-Debridement Wound Measurements: 0 0.2 cm x 0.3 cm x superficial cm  Type of Debridement: Sharp Excisional Tissue Removed: Non-viable soft tissue Depth of Debridement: subcutaneous tissue. Technique: Sharp excisional debridement to bleeding, viable wound base.  Dressing: Dry, sterile, compression dressing. Disposition: Patient tolerated procedure well. Patient to return in 2-3 week for follow-up.  Return in about 20 days (around 12/04/2020) for wound care .   Lorenda Peck, DPM

## 2020-11-20 DIAGNOSIS — N186 End stage renal disease: Secondary | ICD-10-CM | POA: Diagnosis not present

## 2020-11-20 DIAGNOSIS — Z992 Dependence on renal dialysis: Secondary | ICD-10-CM | POA: Diagnosis not present

## 2020-11-20 DIAGNOSIS — D631 Anemia in chronic kidney disease: Secondary | ICD-10-CM | POA: Diagnosis not present

## 2020-11-20 DIAGNOSIS — N2581 Secondary hyperparathyroidism of renal origin: Secondary | ICD-10-CM | POA: Diagnosis not present

## 2020-11-20 DIAGNOSIS — D689 Coagulation defect, unspecified: Secondary | ICD-10-CM | POA: Diagnosis not present

## 2020-11-20 DIAGNOSIS — D509 Iron deficiency anemia, unspecified: Secondary | ICD-10-CM | POA: Diagnosis not present

## 2020-11-27 ENCOUNTER — Other Ambulatory Visit: Payer: Self-pay

## 2020-11-27 ENCOUNTER — Inpatient Hospital Stay (HOSPITAL_COMMUNITY)
Admission: EM | Admit: 2020-11-27 | Discharge: 2020-12-08 | DRG: 252 | Disposition: A | Discharge status: Skilled Nursing Facility | Payer: PPO | Attending: Family Medicine | Admitting: Family Medicine

## 2020-11-27 ENCOUNTER — Emergency Department (HOSPITAL_COMMUNITY): Payer: PPO

## 2020-11-27 ENCOUNTER — Encounter (HOSPITAL_COMMUNITY): Payer: Self-pay | Admitting: Emergency Medicine

## 2020-11-27 DIAGNOSIS — I509 Heart failure, unspecified: Secondary | ICD-10-CM

## 2020-11-27 DIAGNOSIS — Z8249 Family history of ischemic heart disease and other diseases of the circulatory system: Secondary | ICD-10-CM

## 2020-11-27 DIAGNOSIS — Z79899 Other long term (current) drug therapy: Secondary | ICD-10-CM

## 2020-11-27 DIAGNOSIS — Z951 Presence of aortocoronary bypass graft: Secondary | ICD-10-CM | POA: Diagnosis not present

## 2020-11-27 DIAGNOSIS — E1122 Type 2 diabetes mellitus with diabetic chronic kidney disease: Secondary | ICD-10-CM | POA: Diagnosis present

## 2020-11-27 DIAGNOSIS — E785 Hyperlipidemia, unspecified: Secondary | ICD-10-CM | POA: Diagnosis present

## 2020-11-27 DIAGNOSIS — E871 Hypo-osmolality and hyponatremia: Secondary | ICD-10-CM | POA: Diagnosis not present

## 2020-11-27 DIAGNOSIS — I517 Cardiomegaly: Secondary | ICD-10-CM | POA: Diagnosis not present

## 2020-11-27 DIAGNOSIS — M25421 Effusion, right elbow: Secondary | ICD-10-CM | POA: Diagnosis not present

## 2020-11-27 DIAGNOSIS — R7989 Other specified abnormal findings of blood chemistry: Secondary | ICD-10-CM | POA: Diagnosis not present

## 2020-11-27 DIAGNOSIS — I255 Ischemic cardiomyopathy: Secondary | ICD-10-CM | POA: Diagnosis not present

## 2020-11-27 DIAGNOSIS — I120 Hypertensive chronic kidney disease with stage 5 chronic kidney disease or end stage renal disease: Secondary | ICD-10-CM | POA: Diagnosis not present

## 2020-11-27 DIAGNOSIS — I25810 Atherosclerosis of coronary artery bypass graft(s) without angina pectoris: Secondary | ICD-10-CM

## 2020-11-27 DIAGNOSIS — R2681 Unsteadiness on feet: Secondary | ICD-10-CM | POA: Diagnosis not present

## 2020-11-27 DIAGNOSIS — E11621 Type 2 diabetes mellitus with foot ulcer: Secondary | ICD-10-CM | POA: Diagnosis present

## 2020-11-27 DIAGNOSIS — R748 Abnormal levels of other serum enzymes: Secondary | ICD-10-CM | POA: Diagnosis not present

## 2020-11-27 DIAGNOSIS — E669 Obesity, unspecified: Secondary | ICD-10-CM | POA: Diagnosis present

## 2020-11-27 DIAGNOSIS — K766 Portal hypertension: Secondary | ICD-10-CM | POA: Diagnosis not present

## 2020-11-27 DIAGNOSIS — I5033 Acute on chronic diastolic (congestive) heart failure: Secondary | ICD-10-CM | POA: Diagnosis not present

## 2020-11-27 DIAGNOSIS — G4733 Obstructive sleep apnea (adult) (pediatric): Secondary | ICD-10-CM | POA: Diagnosis present

## 2020-11-27 DIAGNOSIS — N186 End stage renal disease: Secondary | ICD-10-CM | POA: Diagnosis not present

## 2020-11-27 DIAGNOSIS — T80211A Bloodstream infection due to central venous catheter, initial encounter: Secondary | ICD-10-CM | POA: Diagnosis not present

## 2020-11-27 DIAGNOSIS — M19021 Primary osteoarthritis, right elbow: Secondary | ICD-10-CM | POA: Diagnosis not present

## 2020-11-27 DIAGNOSIS — R0600 Dyspnea, unspecified: Secondary | ICD-10-CM | POA: Diagnosis not present

## 2020-11-27 DIAGNOSIS — R600 Localized edema: Secondary | ICD-10-CM | POA: Diagnosis not present

## 2020-11-27 DIAGNOSIS — I953 Hypotension of hemodialysis: Secondary | ICD-10-CM | POA: Diagnosis not present

## 2020-11-27 DIAGNOSIS — I340 Nonrheumatic mitral (valve) insufficiency: Secondary | ICD-10-CM | POA: Diagnosis not present

## 2020-11-27 DIAGNOSIS — Z992 Dependence on renal dialysis: Secondary | ICD-10-CM

## 2020-11-27 DIAGNOSIS — Z66 Do not resuscitate: Secondary | ICD-10-CM | POA: Diagnosis not present

## 2020-11-27 DIAGNOSIS — R498 Other voice and resonance disorders: Secondary | ICD-10-CM | POA: Diagnosis not present

## 2020-11-27 DIAGNOSIS — Z452 Encounter for adjustment and management of vascular access device: Secondary | ICD-10-CM

## 2020-11-27 DIAGNOSIS — Z6835 Body mass index (BMI) 35.0-35.9, adult: Secondary | ICD-10-CM

## 2020-11-27 DIAGNOSIS — J9601 Acute respiratory failure with hypoxia: Secondary | ICD-10-CM | POA: Diagnosis not present

## 2020-11-27 DIAGNOSIS — B9620 Unspecified Escherichia coli [E. coli] as the cause of diseases classified elsewhere: Secondary | ICD-10-CM | POA: Diagnosis not present

## 2020-11-27 DIAGNOSIS — D649 Anemia, unspecified: Secondary | ICD-10-CM | POA: Diagnosis not present

## 2020-11-27 DIAGNOSIS — I5043 Acute on chronic combined systolic (congestive) and diastolic (congestive) heart failure: Secondary | ICD-10-CM | POA: Diagnosis present

## 2020-11-27 DIAGNOSIS — D509 Iron deficiency anemia, unspecified: Secondary | ICD-10-CM | POA: Diagnosis present

## 2020-11-27 DIAGNOSIS — K3189 Other diseases of stomach and duodenum: Secondary | ICD-10-CM | POA: Diagnosis present

## 2020-11-27 DIAGNOSIS — L97519 Non-pressure chronic ulcer of other part of right foot with unspecified severity: Secondary | ICD-10-CM | POA: Diagnosis not present

## 2020-11-27 DIAGNOSIS — R7881 Bacteremia: Secondary | ICD-10-CM

## 2020-11-27 DIAGNOSIS — Z9181 History of falling: Secondary | ICD-10-CM

## 2020-11-27 DIAGNOSIS — R41841 Cognitive communication deficit: Secondary | ICD-10-CM | POA: Diagnosis not present

## 2020-11-27 DIAGNOSIS — Z741 Need for assistance with personal care: Secondary | ICD-10-CM | POA: Diagnosis not present

## 2020-11-27 DIAGNOSIS — M86171 Other acute osteomyelitis, right ankle and foot: Secondary | ICD-10-CM | POA: Diagnosis present

## 2020-11-27 DIAGNOSIS — Z833 Family history of diabetes mellitus: Secondary | ICD-10-CM

## 2020-11-27 DIAGNOSIS — Z86718 Personal history of other venous thrombosis and embolism: Secondary | ICD-10-CM

## 2020-11-27 DIAGNOSIS — I4819 Other persistent atrial fibrillation: Secondary | ICD-10-CM

## 2020-11-27 DIAGNOSIS — Z7902 Long term (current) use of antithrombotics/antiplatelets: Secondary | ICD-10-CM

## 2020-11-27 DIAGNOSIS — I482 Chronic atrial fibrillation, unspecified: Secondary | ICD-10-CM | POA: Diagnosis not present

## 2020-11-27 DIAGNOSIS — I132 Hypertensive heart and chronic kidney disease with heart failure and with stage 5 chronic kidney disease, or end stage renal disease: Secondary | ICD-10-CM | POA: Diagnosis present

## 2020-11-27 DIAGNOSIS — B9562 Methicillin resistant Staphylococcus aureus infection as the cause of diseases classified elsewhere: Secondary | ICD-10-CM

## 2020-11-27 DIAGNOSIS — S91301A Unspecified open wound, right foot, initial encounter: Secondary | ICD-10-CM | POA: Diagnosis not present

## 2020-11-27 DIAGNOSIS — I248 Other forms of acute ischemic heart disease: Secondary | ICD-10-CM | POA: Diagnosis not present

## 2020-11-27 DIAGNOSIS — Z20822 Contact with and (suspected) exposure to covid-19: Secondary | ICD-10-CM | POA: Diagnosis not present

## 2020-11-27 DIAGNOSIS — N250 Renal osteodystrophy: Secondary | ICD-10-CM | POA: Diagnosis not present

## 2020-11-27 DIAGNOSIS — I2510 Atherosclerotic heart disease of native coronary artery without angina pectoris: Secondary | ICD-10-CM

## 2020-11-27 DIAGNOSIS — M7731 Calcaneal spur, right foot: Secondary | ICD-10-CM | POA: Diagnosis not present

## 2020-11-27 DIAGNOSIS — I214 Non-ST elevation (NSTEMI) myocardial infarction: Secondary | ICD-10-CM

## 2020-11-27 DIAGNOSIS — R162 Hepatomegaly with splenomegaly, not elsewhere classified: Secondary | ICD-10-CM | POA: Diagnosis present

## 2020-11-27 DIAGNOSIS — E1129 Type 2 diabetes mellitus with other diabetic kidney complication: Secondary | ICD-10-CM | POA: Diagnosis not present

## 2020-11-27 DIAGNOSIS — W19XXXA Unspecified fall, initial encounter: Secondary | ICD-10-CM | POA: Diagnosis not present

## 2020-11-27 DIAGNOSIS — R1319 Other dysphagia: Secondary | ICD-10-CM

## 2020-11-27 DIAGNOSIS — I5030 Unspecified diastolic (congestive) heart failure: Secondary | ICD-10-CM | POA: Diagnosis not present

## 2020-11-27 DIAGNOSIS — I272 Pulmonary hypertension, unspecified: Secondary | ICD-10-CM | POA: Diagnosis not present

## 2020-11-27 DIAGNOSIS — Z8616 Personal history of COVID-19: Secondary | ICD-10-CM | POA: Diagnosis not present

## 2020-11-27 DIAGNOSIS — D631 Anemia in chronic kidney disease: Secondary | ICD-10-CM | POA: Diagnosis present

## 2020-11-27 DIAGNOSIS — Z91040 Latex allergy status: Secondary | ICD-10-CM

## 2020-11-27 DIAGNOSIS — Z4901 Encounter for fitting and adjustment of extracorporeal dialysis catheter: Secondary | ICD-10-CM | POA: Diagnosis not present

## 2020-11-27 DIAGNOSIS — N2581 Secondary hyperparathyroidism of renal origin: Secondary | ICD-10-CM | POA: Diagnosis present

## 2020-11-27 DIAGNOSIS — E1165 Type 2 diabetes mellitus with hyperglycemia: Secondary | ICD-10-CM | POA: Diagnosis present

## 2020-11-27 DIAGNOSIS — R531 Weakness: Secondary | ICD-10-CM | POA: Diagnosis not present

## 2020-11-27 DIAGNOSIS — R1314 Dysphagia, pharyngoesophageal phase: Secondary | ICD-10-CM | POA: Diagnosis not present

## 2020-11-27 DIAGNOSIS — Z807 Family history of other malignant neoplasms of lymphoid, hematopoietic and related tissues: Secondary | ICD-10-CM

## 2020-11-27 DIAGNOSIS — L97516 Non-pressure chronic ulcer of other part of right foot with bone involvement without evidence of necrosis: Secondary | ICD-10-CM | POA: Diagnosis present

## 2020-11-27 DIAGNOSIS — R079 Chest pain, unspecified: Secondary | ICD-10-CM

## 2020-11-27 DIAGNOSIS — I361 Nonrheumatic tricuspid (valve) insufficiency: Secondary | ICD-10-CM | POA: Diagnosis not present

## 2020-11-27 DIAGNOSIS — E8770 Fluid overload, unspecified: Secondary | ICD-10-CM | POA: Diagnosis not present

## 2020-11-27 DIAGNOSIS — I739 Peripheral vascular disease, unspecified: Secondary | ICD-10-CM

## 2020-11-27 DIAGNOSIS — I252 Old myocardial infarction: Secondary | ICD-10-CM

## 2020-11-27 DIAGNOSIS — A4102 Sepsis due to Methicillin resistant Staphylococcus aureus: Secondary | ICD-10-CM

## 2020-11-27 DIAGNOSIS — M7989 Other specified soft tissue disorders: Secondary | ICD-10-CM | POA: Diagnosis not present

## 2020-11-27 DIAGNOSIS — R262 Difficulty in walking, not elsewhere classified: Secondary | ICD-10-CM | POA: Diagnosis not present

## 2020-11-27 DIAGNOSIS — E1169 Type 2 diabetes mellitus with other specified complication: Secondary | ICD-10-CM | POA: Diagnosis not present

## 2020-11-27 DIAGNOSIS — I5021 Acute systolic (congestive) heart failure: Secondary | ICD-10-CM | POA: Diagnosis not present

## 2020-11-27 DIAGNOSIS — R1310 Dysphagia, unspecified: Secondary | ICD-10-CM | POA: Diagnosis not present

## 2020-11-27 DIAGNOSIS — Z888 Allergy status to other drugs, medicaments and biological substances status: Secondary | ICD-10-CM

## 2020-11-27 DIAGNOSIS — I10 Essential (primary) hypertension: Secondary | ICD-10-CM | POA: Diagnosis not present

## 2020-11-27 DIAGNOSIS — M48061 Spinal stenosis, lumbar region without neurogenic claudication: Secondary | ICD-10-CM | POA: Diagnosis not present

## 2020-11-27 DIAGNOSIS — R0602 Shortness of breath: Secondary | ICD-10-CM | POA: Diagnosis not present

## 2020-11-27 DIAGNOSIS — M5127 Other intervertebral disc displacement, lumbosacral region: Secondary | ICD-10-CM | POA: Diagnosis not present

## 2020-11-27 DIAGNOSIS — M25521 Pain in right elbow: Secondary | ICD-10-CM

## 2020-11-27 DIAGNOSIS — I2699 Other pulmonary embolism without acute cor pulmonale: Secondary | ICD-10-CM | POA: Diagnosis not present

## 2020-11-27 DIAGNOSIS — M6281 Muscle weakness (generalized): Secondary | ICD-10-CM | POA: Diagnosis not present

## 2020-11-27 DIAGNOSIS — R4702 Dysphasia: Secondary | ICD-10-CM | POA: Diagnosis not present

## 2020-11-27 DIAGNOSIS — Z86711 Personal history of pulmonary embolism: Secondary | ICD-10-CM

## 2020-11-27 DIAGNOSIS — J811 Chronic pulmonary edema: Secondary | ICD-10-CM | POA: Diagnosis not present

## 2020-11-27 DIAGNOSIS — M5126 Other intervertebral disc displacement, lumbar region: Secondary | ICD-10-CM | POA: Diagnosis not present

## 2020-11-27 DIAGNOSIS — M898X9 Other specified disorders of bone, unspecified site: Secondary | ICD-10-CM | POA: Diagnosis present

## 2020-11-27 DIAGNOSIS — I25119 Atherosclerotic heart disease of native coronary artery with unspecified angina pectoris: Secondary | ICD-10-CM | POA: Diagnosis not present

## 2020-11-27 DIAGNOSIS — J90 Pleural effusion, not elsewhere classified: Secondary | ICD-10-CM | POA: Diagnosis not present

## 2020-11-27 DIAGNOSIS — I959 Hypotension, unspecified: Secondary | ICD-10-CM | POA: Diagnosis not present

## 2020-11-27 DIAGNOSIS — I5032 Chronic diastolic (congestive) heart failure: Secondary | ICD-10-CM | POA: Diagnosis not present

## 2020-11-27 DIAGNOSIS — I259 Chronic ischemic heart disease, unspecified: Secondary | ICD-10-CM | POA: Diagnosis not present

## 2020-11-27 DIAGNOSIS — S91309A Unspecified open wound, unspecified foot, initial encounter: Secondary | ICD-10-CM

## 2020-11-27 DIAGNOSIS — Z8349 Family history of other endocrine, nutritional and metabolic diseases: Secondary | ICD-10-CM

## 2020-11-27 LAB — RENAL FUNCTION PANEL
Albumin: 2.6 g/dL — ABNORMAL LOW (ref 3.5–5.0)
Anion gap: 11 (ref 5–15)
BUN: 74 mg/dL — ABNORMAL HIGH (ref 8–23)
CO2: 24 mmol/L (ref 22–32)
Calcium: 8 mg/dL — ABNORMAL LOW (ref 8.9–10.3)
Chloride: 100 mmol/L (ref 98–111)
Creatinine, Ser: 6.75 mg/dL — ABNORMAL HIGH (ref 0.61–1.24)
GFR, Estimated: 8 mL/min — ABNORMAL LOW (ref >60)
Glucose, Bld: 178 mg/dL — ABNORMAL HIGH (ref 70–99)
Phosphorus: 4.4 mg/dL (ref 2.5–4.6)
Potassium: 3.8 mmol/L (ref 3.5–5.1)
Sodium: 135 mmol/L (ref 135–145)

## 2020-11-27 LAB — BASIC METABOLIC PANEL
Anion gap: 14 (ref 5–15)
BUN: 69 mg/dL — ABNORMAL HIGH (ref 8–23)
CO2: 22 mmol/L (ref 22–32)
Calcium: 8.4 mg/dL — ABNORMAL LOW (ref 8.9–10.3)
Chloride: 100 mmol/L (ref 98–111)
Creatinine, Ser: 6.38 mg/dL — ABNORMAL HIGH (ref 0.61–1.24)
GFR, Estimated: 9 mL/min — ABNORMAL LOW (ref >60)
Glucose, Bld: 178 mg/dL — ABNORMAL HIGH (ref 70–99)
Potassium: 4.4 mmol/L (ref 3.5–5.1)
Sodium: 136 mmol/L (ref 135–145)

## 2020-11-27 LAB — RESP PANEL BY RT-PCR (FLU A&B, COVID) ARPGX2
Influenza A by PCR: NEGATIVE
Influenza B by PCR: NEGATIVE
SARS Coronavirus 2 by RT PCR: NEGATIVE

## 2020-11-27 LAB — CBC
HCT: 30.1 % — ABNORMAL LOW (ref 39.0–52.0)
Hemoglobin: 9.1 g/dL — ABNORMAL LOW (ref 13.0–17.0)
MCH: 27.3 pg (ref 26.0–34.0)
MCHC: 30.2 g/dL (ref 30.0–36.0)
MCV: 90.4 fL (ref 80.0–100.0)
Platelets: 156 10*3/uL (ref 150–400)
RBC: 3.33 MIL/uL — ABNORMAL LOW (ref 4.22–5.81)
RDW: 18.8 % — ABNORMAL HIGH (ref 11.5–15.5)
WBC: 15.8 10*3/uL — ABNORMAL HIGH (ref 4.0–10.5)
nRBC: 0 % (ref 0.0–0.2)

## 2020-11-27 LAB — BLOOD GAS, VENOUS
Acid-base deficit: 2.8 mmol/L — ABNORMAL HIGH (ref 0.0–2.0)
Bicarbonate: 21.1 mmol/L (ref 20.0–28.0)
Drawn by: 5678
FIO2: 32
O2 Saturation: 51.2 %
Patient temperature: 37.5
pCO2, Ven: 42.6 mmHg — ABNORMAL LOW (ref 44.0–60.0)
pH, Ven: 7.337 (ref 7.250–7.430)
pO2, Ven: 36.5 mmHg (ref 32.0–45.0)

## 2020-11-27 LAB — HEPATITIS B SURFACE ANTIGEN: Hepatitis B Surface Ag: NONREACTIVE

## 2020-11-27 LAB — BRAIN NATRIURETIC PEPTIDE: B Natriuretic Peptide: 2754 pg/mL — ABNORMAL HIGH (ref 0.0–100.0)

## 2020-11-27 LAB — TROPONIN I (HIGH SENSITIVITY)
Troponin I (High Sensitivity): 312 ng/L (ref <18)
Troponin I (High Sensitivity): 485 ng/L (ref <18)

## 2020-11-27 LAB — MRSA NEXT GEN BY PCR, NASAL: MRSA by PCR Next Gen: DETECTED — AB

## 2020-11-27 LAB — HEPATITIS B SURFACE ANTIBODY,QUALITATIVE: Hep B S Ab: NONREACTIVE

## 2020-11-27 LAB — D-DIMER, QUANTITATIVE: D-Dimer, Quant: 4.65 ug/mL-FEU — ABNORMAL HIGH (ref 0.00–0.50)

## 2020-11-27 LAB — GLUCOSE, CAPILLARY: Glucose-Capillary: 128 mg/dL — ABNORMAL HIGH (ref 70–99)

## 2020-11-27 LAB — APTT: aPTT: 36 seconds (ref 24–36)

## 2020-11-27 MED ORDER — HEPARIN (PORCINE) 25000 UT/250ML-% IV SOLN
1300.0000 [IU]/h | INTRAVENOUS | Status: DC
  Administered 2020-11-27: 1300 [IU]/h via INTRAVENOUS
  Filled 2020-11-27: qty 250

## 2020-11-27 MED ORDER — SODIUM CHLORIDE 0.9 % IV SOLN: 100.0000 mL | INTRAVENOUS | Status: DC | PRN

## 2020-11-27 MED ORDER — FUROSEMIDE 10 MG/ML IJ SOLN: 80.0000 mg | Freq: Once | INTRAMUSCULAR | Status: DC

## 2020-11-27 MED ORDER — LIDOCAINE HCL (PF) 1 % IJ SOLN
5.0000 mL | INTRAMUSCULAR | Status: DC | PRN
  Filled 2020-11-27: qty 5

## 2020-11-27 MED ORDER — ASPIRIN 325 MG PO TABS
325.0000 mg | ORAL_TABLET | Freq: Once | ORAL | Status: AC
  Administered 2020-11-27: 325 mg via ORAL
  Filled 2020-11-27: qty 1

## 2020-11-27 MED ORDER — ACETAMINOPHEN 650 MG RE SUPP: 650.0000 mg | Freq: Four times a day (QID) | RECTAL | Status: DC | PRN

## 2020-11-27 MED ORDER — LORATADINE 10 MG PO TABS
10.0000 mg | ORAL_TABLET | Freq: Every day | ORAL | Status: DC
  Administered 2020-11-27 – 2020-12-07 (×10): 10 mg via ORAL
  Filled 2020-11-27 (×11): qty 1

## 2020-11-27 MED ORDER — HEPARIN BOLUS VIA INFUSION
4000.0000 [IU] | Freq: Once | INTRAVENOUS | Status: AC
  Administered 2020-11-27: 4000 [IU] via INTRAVENOUS

## 2020-11-27 MED ORDER — ALBUTEROL SULFATE (2.5 MG/3ML) 0.083% IN NEBU: 2.5000 mg | INHALATION_SOLUTION | Freq: Four times a day (QID) | RESPIRATORY_TRACT | Status: DC | PRN

## 2020-11-27 MED ORDER — APIXABAN 5 MG PO TABS
5.0000 mg | ORAL_TABLET | Freq: Two times a day (BID) | ORAL | Status: DC
  Administered 2020-11-27 – 2020-11-29 (×4): 5 mg via ORAL
  Filled 2020-11-27 (×5): qty 1

## 2020-11-27 MED ORDER — ONDANSETRON HCL 4 MG PO TABS: 4.0000 mg | ORAL_TABLET | Freq: Four times a day (QID) | ORAL | Status: DC | PRN

## 2020-11-27 MED ORDER — ALBUTEROL SULFATE HFA 108 (90 BASE) MCG/ACT IN AERS: 1.0000 | INHALATION_SPRAY | Freq: Four times a day (QID) | RESPIRATORY_TRACT | Status: DC | PRN

## 2020-11-27 MED ORDER — PANTOPRAZOLE SODIUM 40 MG PO TBEC
40.0000 mg | DELAYED_RELEASE_TABLET | Freq: Every day | ORAL | Status: DC
  Administered 2020-11-27 – 2020-12-08 (×10): 40 mg via ORAL
  Filled 2020-11-27 (×12): qty 1

## 2020-11-27 MED ORDER — HEPARIN SODIUM (PORCINE) 1000 UNIT/ML DIALYSIS: 1000.0000 [IU] | INTRAMUSCULAR | Status: DC | PRN

## 2020-11-27 MED ORDER — ONDANSETRON HCL 4 MG/2ML IJ SOLN
4.0000 mg | Freq: Four times a day (QID) | INTRAMUSCULAR | Status: DC | PRN
  Administered 2020-11-28: 4 mg via INTRAVENOUS
  Filled 2020-11-27: qty 2

## 2020-11-27 MED ORDER — MUPIROCIN 2 % EX OINT
TOPICAL_OINTMENT | Freq: Two times a day (BID) | CUTANEOUS | Status: DC
  Filled 2020-11-27 (×3): qty 22

## 2020-11-27 MED ORDER — FLUTICASONE PROPIONATE 50 MCG/ACT NA SUSP: 2.0000 | Freq: Every day | NASAL | Status: DC | PRN

## 2020-11-27 MED ORDER — HEPARIN SODIUM (PORCINE) 1000 UNIT/ML DIALYSIS
2000.0000 [IU] | Freq: Once | INTRAMUSCULAR | Status: AC
  Administered 2020-11-27: 2000 [IU] via INTRAVENOUS_CENTRAL

## 2020-11-27 MED ORDER — ALTEPLASE 2 MG IJ SOLR
2.0000 mg | Freq: Once | INTRAMUSCULAR | Status: DC | PRN
  Filled 2020-11-27: qty 2

## 2020-11-27 MED ORDER — CALCIUM ACETATE (PHOS BINDER) 667 MG PO CAPS
1334.0000 mg | ORAL_CAPSULE | Freq: Three times a day (TID) | ORAL | Status: DC
  Administered 2020-11-27 – 2020-12-08 (×21): 1334 mg via ORAL
  Filled 2020-11-27 (×22): qty 2

## 2020-11-27 MED ORDER — PENTAFLUOROPROP-TETRAFLUOROETH EX AERO
1.0000 "application " | INHALATION_SPRAY | CUTANEOUS | Status: DC | PRN
  Filled 2020-11-27: qty 30

## 2020-11-27 MED ORDER — ACETAMINOPHEN 325 MG PO TABS
650.0000 mg | ORAL_TABLET | Freq: Four times a day (QID) | ORAL | Status: DC | PRN
  Administered 2020-11-28 – 2020-12-08 (×10): 650 mg via ORAL
  Filled 2020-11-27 (×11): qty 2

## 2020-11-27 MED ORDER — FERROUS SULFATE 325 (65 FE) MG PO TABS
325.0000 mg | ORAL_TABLET | Freq: Every day | ORAL | Status: DC
  Administered 2020-11-28 – 2020-11-29 (×2): 325 mg via ORAL
  Filled 2020-11-27 (×2): qty 1

## 2020-11-27 MED ORDER — LEVOCETIRIZINE DIHYDROCHLORIDE 5 MG PO TABS: 5.0000 mg | ORAL_TABLET | Freq: Every evening | ORAL | Status: DC

## 2020-11-27 MED ORDER — CHLORHEXIDINE GLUCONATE CLOTH 2 % EX PADS
6.0000 | MEDICATED_PAD | Freq: Every day | CUTANEOUS | Status: DC
  Administered 2020-11-28 – 2020-11-30 (×3): 6 via TOPICAL

## 2020-11-27 MED ORDER — LIDOCAINE-PRILOCAINE 2.5-2.5 % EX CREA: 1.0000 "application " | TOPICAL_CREAM | CUTANEOUS | Status: DC | PRN

## 2020-11-27 NOTE — Plan of Care (Signed)

## 2020-11-27 NOTE — ED Triage Notes (Signed)
Pt arrived by [facility] for fall. Pt was walking to bathroom when felt weak and fell. Ems states pt has been weak x 1 week. Pt supposed to have dialysis today.

## 2020-11-27 NOTE — Procedures (Signed)
   HEMODIALYSIS TREATMENT NOTE:  Uneventful 4 hour low-heparin HD session completed via [facility] TDC.  Cath entry site is unremarkable. Goal met: 3 liters removed without interruption in UF.  Hemodynamically stable throughout session.  All blood was returned.  Wishes to remain on O2 via [place] in lieu of CPAP.  Results from [place]: Infectious Diseases Result Type Result Value Relevant Reference Range Interpretation Date  Hep B core Ab Total (anti-HBc) Negative Negative - November 13, 2020  Hep B Surface Ab (anti-HBs) < 10 mIU/mL < 10 mIU/mL, Non- Immune  - November 13, 2020  Hep B Surface Ag (HBsAg) Negative Negative - November 13, 2020  HCV Ab (anti-HCV) Nonreactive Non-Reactive - November 13, 2020      Rockwell Alexandria, RN

## 2020-11-27 NOTE — Progress Notes (Signed)
ANTICOAGULATION CONSULT NOTE -   Pharmacy Consult for Heparin--> apixaban Indication: chest pain/ACS  Allergies  Allergen Reactions   Latex Hives and Rash   Lipitor [Atorvastatin]     Leg aches     Patient Measurements: Height: 6\' 1"  (185.4 cm) Weight: 122 kg (268 lb 15.4 oz) IBW/kg (Calculated) : 79.9 HEPARIN DW (KG): 106.5   Vital Signs: Temp: 99.5 F (37.5 C) (10/17 0942) Temp Source: Oral (10/17 0942) BP: 116/66 (10/17 1530) Pulse Rate: 69 (10/17 1530)  Labs: Recent Labs    11/27/20 1008 11/27/20 1150  APTT 36  --   CREATININE 6.38*  --   TROPONINIHS 312* 485*     Estimated Creatinine Clearance: 14.9 mL/min (A) (by C-G formula based on SCr of 6.38 mg/dL (H)).   Medical History: Past Medical History:  Diagnosis Date   Anemia associated with chronic renal failure basically   Asthma    as a child   CHF (congestive heart failure) ([place])    Chronic kidney disease    Sees Dr [name]   Constipation    Coronary artery disease    COVID-19    Depression    Diabetes mellitus without complication ([place])    DVT (deep venous thrombosis) ([place])    History of blood clots    Hyperlipidemia    Hypertension    Leg pain    ABIs/LE Arterial US 03/2019: ABIs unreliable; +calcification, no evidence of stenosis   Myocardial infarction [facility])    Obesity    Pneumonia 2018   Pulmonary embolism (HCC)    Sleep apnea    CPAP   SOB (shortness of breath)    Swelling of both lower extremities     Medications:  See med rec  Assessment: 70 y.o. male with significant medical history as below, including ESRD on HD MWF (last HD Friday- full session), CHF, YWV(3710) who presents to the ED with complaint of fall, dyspnea. Patient is not on any blood thinners other than plavix.  Patient with elevated troponins. Pharmacy asked to start heparin Plan is to anticoagulate with eliquis now  Goal of Therapy:  Heparin level 0.3-0.7 units/ml Monitor platelets by anticoagulation protocol:  Yes   Plan:  D/c heparin Eliquis 5mg  po bid Educate on eliquis Continue to monitor H&H and platelets  Isac Sarna, BS Vena [place], BCPS Clinical Pharmacist Pager [phone] 11/27/2020,4:06 PM

## 2020-11-27 NOTE — ED Notes (Signed)
Date and time results received: 11/27/20 1052 (use smartphrase ".now" to insert current time)  Test: Trop Critical Value: 312  Name of Provider Notified: Dr. Pearline Cables  Orders Received? Or Actions Taken?: see chart

## 2020-11-27 NOTE — Consult Note (Signed)
[facility] Nurse Consult Note: Reason for Consult: Chronic, nonhealing right lateral foot ulcer.  Followed by Podiatry in the community by Dr. March Rummage.  Last seen on 11/14/20 by Dr. Blenda Mounts in that practice. There is a moleskin periwound pad in place that may be removed at this time. Wound type: Neuropathic Pressure Injury POA: N/A Measurement: 0.4cm x 0.3cm x 0.2cm Wound bed: ruddy red, moist Drainage (amount, consistency, odor) small serosanguinous Periwound: intact, erythematous Dressing procedure/placement/frequency: I will implement conservative care orders for the wound using soap and water cleanse and topical care with an antimicrobial nonadherent, xeroform. Nursing is to top with dry gauze and secure with Kerlix roll gauze. Daily changes are recommended as is off loading of heels. Follow up with Drs. Price/Sikora when stable and after discharge is recommended.  [facility] nursing team will not follow, but will remain available to this patient, the nursing and medical teams.  Please re-consult if needed. Thanks, Maudie Flakes, MSN, RN, [place], Arther Abbott  Pager# [phone]

## 2020-11-27 NOTE — ED Notes (Signed)
Report given to [facility] in ICU

## 2020-11-27 NOTE — ED Notes (Signed)
Unable to sign MSE pt in hallway. MD at bedside

## 2020-11-27 NOTE — Progress Notes (Signed)
ANTICOAGULATION CONSULT NOTE - Initial Consult  Pharmacy Consult for Heparin Indication: chest pain/ACS  Allergies  Allergen Reactions   Latex Hives and Rash   Lipitor [Atorvastatin]     Leg aches     Patient Measurements: Height: 6\' 1"  (185.4 cm) Weight: 122 kg (268 lb 15.4 oz) IBW/kg (Calculated) : 79.9 HEPARIN DW (KG): 106.5   Vital Signs: Temp: 99.5 F (37.5 C) (10/17 0942) Temp Source: Oral (10/17 0942) BP: 116/61 (10/17 1300) Pulse Rate: 73 (10/17 1300)  Labs: Recent Labs    11/27/20 1008 11/27/20 1150  CREATININE 6.38*  --   TROPONINIHS 312* 485*    Estimated Creatinine Clearance: 14.9 mL/min (A) (by C-G formula based on SCr of 6.38 mg/dL (H)).   Medical History: Past Medical History:  Diagnosis Date   Anemia associated with chronic renal failure basically   Asthma    as a child   CHF (congestive heart failure) ([place])    Chronic kidney disease    Sees Dr [name]   Constipation    Coronary artery disease    COVID-19    Depression    Diabetes mellitus without complication ([place])    DVT (deep venous thrombosis) ([place])    History of blood clots    Hyperlipidemia    Hypertension    Leg pain    ABIs/LE Arterial US 03/2019: ABIs unreliable; +calcification, no evidence of stenosis   Myocardial infarction [facility])    Obesity    Pneumonia 2018   Pulmonary embolism (HCC)    Sleep apnea    CPAP   SOB (shortness of breath)    Swelling of both lower extremities     Medications:  See med rec  Assessment: 70 y.o. male with significant medical history as below, including ESRD on HD MWF (last HD Friday- full session), CHF, RPR([id number]) who presents to the ED with complaint of fall, dyspnea. Patient is not on any blood thinners other than plavix.  Patient with elevated troponins. Pharmacy asked to start heparin  Goal of Therapy:  Heparin level 0.3-0.7 units/ml Monitor platelets by anticoagulation protocol: Yes   Plan:  Give 4000 units bolus x 1 Start heparin  infusion at 1300 units/hr Check anti-Xa level in ~6-8 hours and daily while on heparin Continue to monitor H&H and platelets  Isac Sarna, BS Vena Austria, BCPS Clinical Pharmacist Pager [phone] 11/27/2020,1:20 PM

## 2020-11-27 NOTE — H&P (Addendum)
History and Physical    Travis Blair WTU:[id number] DOB: 25-Aug-1950 DOA: 11/27/2020  PCP: Travis Frizzle, MD   Patient coming from: home   Chief Complaint: dyspnea and cough   HPI: Travis Blair is a 70 y.o. male with medical history significant of CHF, ERSD on HD (M-W-F), CAD, DVT/PE, and T2DM, CABG with post operative atrial fibrillation, pulmonary hypertension who presented with dyspnea.  Patient reported having cough for about 2 weeks, persistent and worsening.  Tried several antitussive agents at home without improvement of symptoms.  He went to dialysis on Friday, 3 days ago, yesterday he was noted to be dyspneic by his wife, moderate in intensity, associated with cough but no chest pain. Last night he was noted to have poor balance, and he declined to be brought to the emergency department. This morning he fell while in the bathroom, landed between the commode and the wall, EMS was called and patient was brought to the hospital.  His main symptom seems to be increased work of breathing/dyspnea, moderate to severe in intensity, worse with exertion, associated with cough, no chest pain, no improving factors. Last night not able to sleep due to persistent cough, dry.  He continues to make urine 3 times daily.  In June 2022 he decided to stop apixaban due to weakness, he was placed on clopidogrel. Last year he had COVID-pneumonia and pulm embolism. About 3 months ago he was started on hemodialysis, he has a fistula which is not mature yet.  He uses a tunneled dialysis catheter for dialysis.    ED Course: Patient was found in respiratory distress, and placed on supplemental oxygen per nasal cannula.  Work-up showed elevated troponins and high D-dimer, he was placed on heparin drip. Nephrology has been contacted for hemodialysis.  Review of Systems:  1. General: No fevers, no chills, generalized weakness. 2. ENT: No runny nose or sore throat, no hearing  disturbances 3. Pulmonary: Positive for cough, dyspnea as mentioned in history of present illness  4. Cardiovascular: No angina, claudication, positive lower extremity edema, but no pnd or orthopnea 5. Gastrointestinal: No nausea or vomiting, no diarrhea or constipation 6. Hematology: No easy bruisability or frequent infections 7. Urology: No dysuria, hematuria or increased urinary frequency 8. Dermatology: Right foot ulcerated wound.. 9. Neurology: No seizures or paresthesias 10. Musculoskeletal: No joint pain or deformities  Past Medical History:  Diagnosis Date   Anemia associated with chronic renal failure basically   Asthma    as a child   CHF (congestive heart failure) (HCC)    Chronic kidney disease    Sees Dr Travis Blair   Constipation    Coronary artery disease    COVID-19    Depression    Diabetes mellitus without complication ([place])    DVT (deep venous thrombosis) ([place])    History of blood clots    Hyperlipidemia    Hypertension    Leg pain    ABIs/LE Arterial US 03/2019: ABIs unreliable; +calcification, no evidence of stenosis   Myocardial infarction [facility])    Obesity    Pneumonia 2018   Pulmonary embolism (HCC)    Sleep apnea    CPAP   SOB (shortness of breath)    Swelling of both lower extremities     Past Surgical History:  Procedure Laterality Date   A/V FISTULAGRAM Left 09/29/2020   Procedure: A/V FISTULAGRAM;  Surgeon: Travis Mould, MD;  Location: [facility];  Service: Cardiovascular;  Laterality: Left;  AV FISTULA PLACEMENT Left 05/04/2020   Procedure: LEFT ARM ARTERIOVENOUS (AV) FISTULA CREATION;  Surgeon: Travis Posner, MD;  Location: [facility];  Service: Vascular;  Laterality: Left;   COLONOSCOPY     CORONARY ARTERY BYPASS GRAFT N/A 03/07/2017   Procedure: CORONARY ARTERY BYPASS GRAFTING (CABG), X 3 , USING RIGHT INTERNAL MAMMARY ARTERY, AND RIGHT LEG GREATER SAPHENOUS VEIN HARVESTED ENDOSCOPICALLY;  Surgeon: Travis Poot, MD;  Location: [place];  Service: Open Heart Surgery;  Laterality: N/A;   FISTULA SUPERFICIALIZATION Left 10/09/2020   Procedure: FISTULA SUPERFICIALIZATION;  Surgeon: Travis Mould, MD;  [facility];  Service: Vascular;  Laterality: Left;   HAND SURGERY Right 1991   IR FLUORO GUIDE CV LINE RIGHT  09/04/2020   IR US GUIDE VASC ACCESS RIGHT  09/04/2020   LIGATION OF COMPETING BRANCHES OF ARTERIOVENOUS FISTULA Left 10/09/2020   Procedure: LIGATION OF 2 LARGE COMPETING BRANCHES OF LEFT BRACHIOCEPHALIC FISTULA;  Surgeon: Travis Mould, MD;  Location: [place];  Service: Vascular;  Laterality: Left;   RIGHT HEART CATH N/A 07/07/2020   Procedure: RIGHT HEART CATH;  Surgeon: Travis Artist, MD;  Location: [place] [facility];  Service: Cardiovascular;  Laterality: N/A;   RIGHT/LEFT HEART CATH AND CORONARY ANGIOGRAPHY N/A 12/20/2016   Procedure: RIGHT/LEFT HEART CATH AND CORONARY ANGIOGRAPHY;  Surgeon: Travis Mormon, MD;  Location: [place] [facility];  Service: Cardiovascular;  Laterality: N/A;   TEE WITHOUT CARDIOVERSION N/A 03/07/2017   Procedure: TRANSESOPHAGEAL ECHOCARDIOGRAM (TEE);  Surgeon: Travis [facility], Travis Salina, MD;  Location: [place];  Service: Open Heart Surgery;  Laterality: N/A;   TONSILLECTOMY       reports that he has never smoked. He has never used smokeless tobacco. He reports that he does not drink alcohol and does not use drugs.  Allergies  Allergen Reactions   Latex Hives and Rash   Lipitor [Atorvastatin]     Leg aches     Family History  Problem Relation Age of Onset   Heart disease Mother    Diabetes Mother    Multiple myeloma Mother    Cancer Father        type unknown, as a child   Heart disease Father    Hypertension Father    Congenital heart disease Father    Hyperlipidemia Father    Lymphoma Sister      Prior to Admission medications   Medication Sig Start Date End Date Taking? Authorizing Provider  albuterol (VENTOLIN HFA) 108 (90 Base) MCG/ACT inhaler  Inhale 1-2 puffs into the lungs every 6 (six) hours as needed for wheezing or shortness of breath. 07/04/20  Yes Travis Frizzle, MD  calcium acetate (PHOSLO) 667 MG capsule Take 1,334 mg by mouth 3 (three) times daily. 09/19/20  Yes [provider]  clopidogrel (PLAVIX) 75 MG tablet Take 1 tablet (75 mg total) by mouth daily. 11/09/20  Yes Nahser, Wonda Cheng, MD  ferrous sulfate (FERROUSUL) 325 (65 FE) MG tablet Take 1 tablet (325 mg total) by mouth daily with breakfast. 01/28/20  Yes Travis Frizzle, MD  fluticasone (FLONASE) 50 MCG/ACT nasal spray Place 2 sprays into both nostrils daily. Patient taking differently: Place 2 sprays into both nostrils daily as needed for allergies. 05/01/20  Yes Travis Frizzle, MD  levocetirizine (XYZAL) 5 MG tablet Take 5 mg by mouth every evening.   Yes [provider]  pantoprazole (PROTONIX) 40 MG tablet Take 1 tablet (40 mg total) by mouth daily.  08/18/20  Yes Travis Frizzle, MD  Evolocumab (REPATHA SURECLICK) 924 MG/ML SOAJ Inject 1 pen into the skin every 14 (fourteen) days. Patient not taking: Reported on 11/27/2020 06/23/20   Nahser, Wonda Cheng, MD  fluorouracil (EFUDEX) 5 % cream Apply topically 2 (two) times daily. Patient not taking: Reported on 11/27/2020 10/11/20   [provider]    Physical Exam: Vitals:   11/27/20 1130 11/27/20 1145 11/27/20 1200 11/27/20 1300  BP:    116/61  Pulse: 80 75 82 73  Resp:    20  Temp:      TempSrc:      SpO2: 99% 98% 96% 96%  Weight:      Height:        Vitals:   11/27/20 1130 11/27/20 1145 11/27/20 1200 11/27/20 1300  BP:    116/61  Pulse: 80 75 82 73  Resp:    20  Temp:      TempSrc:      SpO2: 99% 98% 96% 96%  Weight:      Height:       General: deconditioned  Neurology: Awake and alert, non focal Head and Neck. Head normocephalic. Neck supple with no adenopathy or thyromegaly.   E ENT: mild pallor, no icterus, oral mucosa moist Cardiovascular: No JVD. S1-S2 present,  rhythmic, no gallops, rubs, or murmurs. +++ pitting bilateral lower extremity edema. Pulmonary: positive breath sounds bilaterally, adequate air movement, no wheezing, or rhonchi, positive bilateral rales. Gastrointestinal. Abdomen soft and non tender Skin. Right foot plantar ulcer, clean base, round about 1 cm diameter.  Musculoskeletal: no joint deformities     Labs on Admission: I have personally reviewed following labs and imaging studies  CBC: No results for input(s): WBC, NEUTROABS, HGB, HCT, MCV, PLT in the last 168 hours. Basic Metabolic Panel: Recent Labs  Lab 11/27/20 1008  NA 136  K 4.4  CL 100  CO2 22  GLUCOSE 178*  BUN 69*  CREATININE 6.38*  CALCIUM 8.4*   GFR: Estimated Creatinine Clearance: 14.9 mL/min (A) (by C-G formula based on SCr of 6.38 mg/dL (H)). Liver Function Tests: No results for input(s): AST, ALT, ALKPHOS, BILITOT, PROT, ALBUMIN in the last 168 hours. No results for input(s): LIPASE, AMYLASE in the last 168 hours. No results for input(s): AMMONIA in the last 168 hours. Coagulation Profile: No results for input(s): INR, PROTIME in the last 168 hours. Cardiac Enzymes: No results for input(s): CKTOTAL, CKMB, CKMBINDEX, TROPONINI in the last 168 hours. BNP (last 3 results) No results for input(s): PROBNP in the last 8760 hours. HbA1C: No results for input(s): HGBA1C in the last 72 hours. CBG: No results for input(s): GLUCAP in the last 168 hours. Lipid Profile: No results for input(s): CHOL, HDL, LDLCALC, TRIG, CHOLHDL, LDLDIRECT in the last 72 hours. Thyroid Function Tests: No results for input(s): TSH, T4TOTAL, FREET4, T3FREE, THYROIDAB in the last 72 hours. Anemia Panel: No results for input(s): VITAMINB12, FOLATE, FERRITIN, TIBC, IRON, RETICCTPCT in the last 72 hours. Urine analysis:    Component Value Date/Time   COLORURINE YELLOW 03/10/2017 1553   APPEARANCEUR HAZY (A) 03/10/2017 1553   LABSPEC 1.013 03/10/2017 1553   PHURINE 5.0  03/10/2017 1553   GLUCOSEU NEGATIVE 03/10/2017 1553   HGBUR NEGATIVE 03/10/2017 1553   BILIRUBINUR NEGATIVE 03/10/2017 1553   KETONESUR NEGATIVE 03/10/2017 1553   PROTEINUR 30 (A) 03/10/2017 1553   NITRITE NEGATIVE 03/10/2017 1553   LEUKOCYTESUR NEGATIVE 03/10/2017 1553    Radiological Exams on Admission:  DG Chest Port 1 View  Result Date: 11/27/2020 CLINICAL DATA:  Fall.  Chest and right foot pain. EXAM: PORTABLE CHEST 1 VIEW COMPARISON:  09/01/2020 FINDINGS: Unchanged mild cardiomegaly. Unchanged mild pulmonary vascular congestion. Small bilateral pleural effusions are not significantly changed since prior examination. Interval placement of dialysis catheter. Median sternotomy changes again noted. IMPRESSION: No acute abnormality of the chest. Unchanged findings of mild CHF/fluid volume overload. Electronically Signed   By: Miachel Roux M.D.   On: 11/27/2020 11:24   DG Foot 2 Views Right  Result Date: 11/27/2020 CLINICAL DATA:  wound to lateral fore foot EXAM: RIGHT FOOT - 2 VIEW COMPARISON:  Radiograph 08/18/2020 FINDINGS: There is no evidence of acute fracture or dislocation. Unchanged bulky ossification along the base of the fifth metatarsal, possibly from prior trauma. There is a lateral forefoot soft tissue defect. There is no frank bony destruction radiographically. Mild midfoot and forefoot degenerative changes. Plantar and dorsal calcaneal spurring. Vascular calcifications. IMPRESSION: Lateral forefoot soft tissue defect. No frank bony destruction radiographically to suggest osteomyelitis. MRI would be more sensitive. Electronically Signed   By: Maurine Simmering M.D.   On: 11/27/2020 11:32    EKG: Independently reviewed.  95 bpm, right axis deviation, normal intervals, sinus rhythm with poor R wave progression, no significant ST segment or T wave changes, low voltage. (Besides right axis deviation no significant change from old EKG)  Assessment/Plan Principal Problem:   Heart failure  (HCC) Active Problems:   Type 2 diabetes mellitus with hyperlipidemia (HCC)   Obesity   S/P CABG x 3   Essential hypertension   ESRD (end stage renal disease) (HCC)   CAD (coronary artery disease)   70 year old male with a significant past medical history for heart failure, chronic atrial fibrillation, hypertension, type 2 diabetes mellitus, coronary artery disease and end-stage renal disease who presents with dyspnea.  Recently started on hemodialysis about 3 months ago.  Cough for the last 2 weeks, last hemodialysis 3 days ago, noted to have increased work of breathing and dyspnea for the last 24 hours.  No chest pain. On his initial physical examination temperature 99.5, blood pressure 145/74, heart rate 94, respirate 20, oxygen saturation 78% on room air, his lungs are rales bilaterally, heart S1-S2, present, irregularly irregular, abdomen soft and nontender, positive 3+ pitting bilateral extremity edema.  Venous pH 7.33, PCO2 42.6, sodium 136, potassium 4.4, chloride 100, bicarb 22, glucose 178, BUN 69, creatinine 6.38, BNP 2754, high sensitive troponin 312-485. D-dimer 4.6. SARS COVID-19 negative  Chest radiograph with cardiomegaly, positive bilateral hilar vascular congestion, interstitial infiltrates, bilateral small pleural effusions.  Hemodialysis catheter right IJ.  Mr. Monica is being admitted to the hospital with a working diagnosis of acute hypoxic respiratory failure due to volume overload in the setting of of end-stage renal disease.  Acute hypoxemic respiratory failure due to volume overload, acute diastolic heart failure, in the setting of end-stage renal disease. Patient will be admitted to stepdown unit.  Continue supplemental oxygen per nasal cannula. He continues to make urine, will add 80 mg of intravenous furosemide while he waits for his hemodialysis treatment. Nephrology has been consulted for renal replacement therapy. Access right IJ tunnel catheter.  He has  elevated troponin, likely demand ischemia in the setting of acute volume overload/heart failure exacerbation. He has no chest pain or ischemic changes on his EKG.  Further work-up with limited echocardiography, looking for wall motion normalities.  2.  End-stage renal disease/hemodialysis.  Positive volume overload, no hyperkalemia  or acidosis. Plan for renal rhythm therapy today.  3.  Ischemic cardiomyopathy, coronary disease status post CABG. Echocardiography from 49/7530 with LV systolic function 60 to 05%.  RV systolic function preserved.  RSVP 57.3.  No significant valvular disease. Continue volume control per ultrafiltration. No chest pain or angina. Elevated troponins likely demand ischemia, follow-up Limited echocardiography.  4.  History of DVT/PE.  Patient will be resumed on apixaban for anticoagulation. Discontinue clopidogrel.  5.  Chronic atrial fibrillation.  Patient currently is rate controlled, he is not on any AV blockade at home. Resume anticoagulation with apixaban.  6.  Type 2 diabetes mellitus.  His admission glucose is controlled, follow-up capillary glucose as needed.  5.  Iron deficiency anemia.  Continue iron supplements.  6. Obesity class 2. Calculated BMI is 35.4   Status is: Inpatient  Remains inpatient appropriate because: respiratory and heart failure   DVT prophylaxis: Apixban   Code Status:   full  Family Communication:   I spoke with patient's wofe at the bedside, we talked in detail about patient's condition, plan of care and prognosis and all questions were addressed.     Consults called:  Nephrology   Admission status:  Inpatient    Cote Mayabb Gerome Apley MD [facility]   11/27/2020, 1:28 PM

## 2020-11-27 NOTE — ED Provider Notes (Signed)
[facility] EMERGENCY DEPARTMENT Provider Note   CSN: [id number] Arrival date & time: 11/27/20  0930     History Chief Complaint  Patient presents with   Travis Blair is a 70 y.o. male.  This is a 70 y.o. male with significant medical history as below, including ESRD on HD MWF (last HD Friday- full session), CHF, DVT who presents to the ED with complaint of fall, dyspnea.   Patient reports fall this morning while trying to put on his underwear.  No head injury, no LOC.  No thinners.  Patient reports he lost his balance and he was leaning forward.  Spouse at bedside reports patient has been having increasing dyspnea over the past few days, worsened in the past 24 hours.  No home oxygen.  OSA on CPAP with compliance.  No productive cough.  No fevers or chills.  No recent travel or sick contacts.  Last session was Friday, full session.  Also access to chest wall, also has graft to left upper extremity which is not in use yet.  Nonproductive cough over the last few days, URI symptoms.  No chest pain.  No abdominal pain, no nausea or vomiting.  No change in bowel or bladder function.  No rashes.  Dyspnea worsened with minimal exertion.  Increased lower extremity swelling over the past few days.  Dyspnea is improve with rest.  The history is provided by the patient and the spouse. No language interpreter was used.  Fall This is a new problem. The current episode started 1 to 2 hours ago. Associated symptoms include shortness of breath. Pertinent negatives include no chest pain, no abdominal pain and no headaches.      Past Medical History:  Diagnosis Date   Anemia associated with chronic renal failure basically   Asthma    as a child   CHF (congestive heart failure) ([place])    Chronic kidney disease    Sees Dr [name]   Constipation    Coronary artery disease    COVID-19    Depression    Diabetes mellitus without complication ([place])    DVT (deep venous thrombosis) ([place])     History of blood clots    Hyperlipidemia    Hypertension    Leg pain    ABIs/LE Arterial US 03/2019: ABIs unreliable; +calcification, no evidence of stenosis   Myocardial infarction ([place])    Obesity    Pneumonia 2018   Pulmonary embolism (HCC)    Sleep apnea    CPAP   SOB (shortness of breath)    Swelling of both lower extremities     Patient Active Problem List   Diagnosis Date Noted   Heart failure ([place]) 11/27/2020   ESRD (end stage renal disease) ([place]) 11/27/2020   CAD (coronary artery disease) 11/27/2020   Unspecified protein-calorie malnutrition ([place]) 09/14/2020   Hypothyroidism, unspecified 09/09/2020   Allergy, unspecified, initial encounter 09/06/2020   Anaphylactic shock, unspecified, initial encounter 09/06/2020   Anemia in chronic kidney disease 09/06/2020   Coagulation defect, unspecified ([place]) 09/06/2020   Diarrhea, unspecified 09/06/2020   Dyspnea, unspecified 09/06/2020   Encounter for immunization 09/06/2020   End stage renal disease ([place]) 09/06/2020   Iron deficiency anemia, unspecified 09/06/2020   Old myocardial infarction 09/06/2020   Pain, unspecified 09/06/2020   Personal history of pulmonary embolism 09/06/2020   Pruritus, unspecified 09/06/2020   Secondary hyperparathyroidism of renal origin ([place]) 09/06/2020   Unspecified systolic (congestive) heart failure ([place]) 09/06/2020  Acute on chronic congestive heart failure ([place]) 09/01/2020   CKD (chronic kidney disease) stage 5, GFR less than 15 ml/min (HCC) 09/01/2020   Acute on chronic diastolic CHF (congestive heart failure) ([place]) 09/01/2020   Hypertensive retinopathy of both eyes 02/03/2020   Sleep apnea 01/20/2020   Severe nonproliferative diabetic retinopathy of right eye, with macular edema, associated with type 2 diabetes mellitus ([place]) 01/20/2020   Severe nonproliferative diabetic retinopathy of left eye, with macular edema, associated with type 2 diabetes mellitus ([place]) 01/20/2020   Vitreomacular  adhesion of left eye 01/20/2020   Vitreomacular adhesion of right eye 01/20/2020   Statin myopathy 12/06/2019   Hammer toe of right foot 12/25/2018   Deformity of metatarsal bone of right foot 12/25/2018   Coronary artery disease involving native coronary artery of native heart without angina pectoris 07/27/2018   Essential hypertension 07/27/2018   S/P CABG x 3 03/07/2017   Abnormal stress test 12/18/2016   Right atrial dilatation 11/17/2016   Right ventricular dilation 11/17/2016   Near syncope 11/16/2016   Uncontrolled type 2 diabetes mellitus with hyperglycemia, without long-term current use of insulin ([place]) 11/16/2016   Type 2 diabetes mellitus with hyperlipidemia ([place]) 11/16/2016   Hyperlipidemia, unspecified 11/16/2016   Obesity 11/16/2016   Hyponatremia 11/16/2016   AKI (acute kidney injury) ([place]) 11/16/2016    Past Surgical History:  Procedure Laterality Date   A/V FISTULAGRAM Left 09/29/2020   Procedure: A/V FISTULAGRAM;  Surgeon: Angelia Mould, MD;  Location: [place] [facility];  Service: Cardiovascular;  Laterality: Left;   AV FISTULA PLACEMENT Left 05/04/2020   Procedure: LEFT ARM ARTERIOVENOUS (AV) FISTULA CREATION;  Surgeon: Rosetta Posner, MD;  Location: [facility];  Service: Vascular;  Laterality: Left;   COLONOSCOPY     CORONARY ARTERY BYPASS GRAFT N/A 03/07/2017   Procedure: CORONARY ARTERY BYPASS GRAFTING (CABG), X 3 , USING RIGHT INTERNAL MAMMARY ARTERY, AND RIGHT LEG GREATER SAPHENOUS VEIN HARVESTED ENDOSCOPICALLY;  Surgeon: [name], MD;  Location: [place];  Service: Open Heart Surgery;  Laterality: N/A;   FISTULA SUPERFICIALIZATION Left 10/09/2020   Procedure: FISTULA SUPERFICIALIZATION;  Surgeon: Angelia Mould, MD;  Location: [facility] OR;  Service: Vascular;  Laterality: Left;   HAND SURGERY Right 1991   IR FLUORO GUIDE CV LINE RIGHT  09/04/2020   IR US GUIDE VASC ACCESS RIGHT  09/04/2020   LIGATION OF COMPETING BRANCHES OF ARTERIOVENOUS FISTULA Left  10/09/2020   Procedure: LIGATION OF 2 LARGE COMPETING BRANCHES OF LEFT BRACHIOCEPHALIC FISTULA;  Surgeon: Angelia Mould, MD;  Location: [place];  Service: Vascular;  Laterality: Left;   RIGHT HEART CATH N/A 07/07/2020   Procedure: RIGHT HEART CATH;  Surgeon: Jolaine Artist, MD;  Location: [place] [facility];  Service: Cardiovascular;  Laterality: N/A;   RIGHT/LEFT HEART CATH AND CORONARY ANGIOGRAPHY N/A 12/20/2016   Procedure: RIGHT/LEFT HEART CATH AND CORONARY ANGIOGRAPHY;  Surgeon: [name], MD;  Location: [place] [facility];  Service: Cardiovascular;  Laterality: N/A;   TEE WITHOUT CARDIOVERSION N/A 03/07/2017   Procedure: TRANSESOPHAGEAL ECHOCARDIOGRAM (TEE);  Surgeon: [name], [name], MD;  Location: [place];  Service: Open Heart Surgery;  Laterality: N/A;   TONSILLECTOMY         Family History  Problem Relation Age of Onset   Heart disease Mother    Diabetes Mother    Multiple myeloma Mother    Cancer Father        type unknown, as a child  Heart disease Father    Hypertension Father    Congenital heart disease Father    Hyperlipidemia Father    Lymphoma Sister     Social History   Tobacco Use   Smoking status: Never   Smokeless tobacco: Never  Vaping Use   Vaping Use: Never used  Substance Use Topics   Alcohol use: No   Drug use: No    Home Medications Prior to Admission medications   Medication Sig Start Date End Date Taking? Authorizing Provider  albuterol (VENTOLIN HFA) 108 (90 Base) MCG/ACT inhaler Inhale 1-2 puffs into the lungs every 6 (six) hours as needed for wheezing or shortness of breath. 07/04/20  Yes [name], MD  calcium acetate (PHOSLO) 667 MG capsule Take 1,334 mg by mouth 3 (three) times daily. 09/19/20  Yes [provider]  clopidogrel (PLAVIX) 75 MG tablet Take 1 tablet (75 mg total) by mouth daily. 11/09/20  Yes Nahser, Wonda Cheng, MD  ferrous sulfate (FERROUSUL) 325 (65 FE) MG tablet Take 1 tablet (325 mg total)  by mouth daily with breakfast. 01/28/20  Yes [name], MD  fluticasone (FLONASE) 50 MCG/ACT nasal spray Place 2 sprays into both nostrils daily. Patient taking differently: Place 2 sprays into both nostrils daily as needed for allergies. 05/01/20  Yes [name], MD  levocetirizine (XYZAL) 5 MG tablet Take 5 mg by mouth every evening.   Yes [provider]  pantoprazole (PROTONIX) 40 MG tablet Take 1 tablet (40 mg total) by mouth daily. 08/18/20  Yes [name], MD  Evolocumab (REPATHA SURECLICK) 027 MG/ML SOAJ Inject 1 pen into the skin every 14 (fourteen) days. Patient not taking: Reported on 11/27/2020 06/23/20   Nahser, Wonda Cheng, MD  fluorouracil (EFUDEX) 5 % cream Apply topically 2 (two) times daily. Patient not taking: Reported on 11/27/2020 10/11/20   [provider]    Allergies    Latex and Lipitor [atorvastatin]  Review of Systems   Review of Systems  Constitutional:  Positive for fatigue. Negative for chills and fever.  HENT:  Negative for facial swelling and trouble swallowing.   Eyes:  Negative for photophobia and visual disturbance.  Respiratory:  Positive for cough and shortness of breath.   Cardiovascular:  Positive for leg swelling. Negative for chest pain and palpitations.  Gastrointestinal:  Negative for abdominal pain, nausea and vomiting.  Endocrine: Negative for polydipsia and polyuria.  Genitourinary:  Negative for difficulty urinating and hematuria.  Musculoskeletal:  Negative for gait problem and joint swelling.  Skin:  Negative for pallor and rash.  Neurological:  Positive for weakness. Negative for syncope and headaches.  Psychiatric/Behavioral:  Negative for agitation and confusion.    Physical Exam Updated Vital Signs BP 115/60   Pulse 77   Temp 97.9 F (36.6 C) (Oral)   Resp 20   Ht _0  (1.88 m)   Wt 118.8 kg   SpO2 97%   BMI 33.63 kg/m   Physical Exam Vitals and nursing note reviewed.  Constitutional:       General: He is in acute distress.     Appearance: He is well-developed. He is obese.  HENT:     Head: Normocephalic and atraumatic.     Right Ear: External ear normal.     Left Ear: External ear normal.     Mouth/Throat:     Mouth: Mucous membranes are moist.  Eyes:     General: No scleral icterus. Cardiovascular:  Rate and Rhythm: Normal rate and regular rhythm.     Pulses: Normal pulses.     Heart sounds: Normal heart sounds.  Pulmonary:     Effort: Tachypnea and respiratory distress present.     Breath sounds: Decreased breath sounds present.     Comments: Mild conversational dyspnea Abdominal:     General: Abdomen is flat.     Palpations: Abdomen is soft.     Tenderness: There is no abdominal tenderness.  Musculoskeletal:        General: Normal range of motion.       Arms:     Cervical back: Normal range of motion.     Right lower leg: No edema.     Left lower leg: No edema.  Skin:    General: Skin is warm and dry.     Capillary Refill: Capillary refill takes less than 2 seconds.  Neurological:     Mental Status: He is alert and oriented to person, place, and time.     GCS: GCS eye subscore is 4. GCS verbal subscore is 5. GCS motor subscore is 6.  Psychiatric:        Mood and Affect: Mood normal.        Behavior: Behavior normal.    ED Results / Procedures / Treatments   Labs (all labs ordered are listed, but only abnormal results are displayed) Labs Reviewed  BASIC METABOLIC PANEL - Abnormal; Notable for the following components:      Result Value   Glucose, Bld 178 (*)    BUN 69 (*)    Creatinine, Ser 6.38 (*)    Calcium 8.4 (*)    GFR, Estimated 9 (*)    All other components within normal limits  BRAIN NATRIURETIC PEPTIDE - Abnormal; Notable for the following components:   B Natriuretic Peptide 2,754.0 (*)    All other components within normal limits  BLOOD GAS, VENOUS - Abnormal; Notable for the following components:   pCO2, Ven 42.6 (*)     Acid-base deficit 2.8 (*)    All other components within normal limits  D-DIMER, QUANTITATIVE - Abnormal; Notable for the following components:   D-Dimer, Quant 4.65 (*)    All other components within normal limits  RENAL FUNCTION PANEL - Abnormal; Notable for the following components:   Glucose, Bld 178 (*)    BUN 74 (*)    Creatinine, Ser 6.75 (*)    Calcium 8.0 (*)    Albumin 2.6 (*)    GFR, Estimated 8 (*)    All other components within normal limits  CBC - Abnormal; Notable for the following components:   WBC 15.8 (*)    RBC 3.33 (*)    Hemoglobin 9.1 (*)    HCT 30.1 (*)    RDW 18.8 (*)    All other components within normal limits  TROPONIN I (HIGH SENSITIVITY) - Abnormal; Notable for the following components:   Troponin I (High Sensitivity) 312 (*)    All other components within normal limits  TROPONIN I (HIGH SENSITIVITY) - Abnormal; Notable for the following components:   Troponin I (High Sensitivity) 485 (*)    All other components within normal limits  RESP PANEL BY RT-PCR (FLU A&B, COVID) ARPGX2  MRSA NEXT GEN BY PCR, NASAL  APTT  HEPATITIS B SURFACE ANTIGEN  HEPATITIS B SURFACE ANTIBODY,QUALITATIVE  HEPATITIS B SURFACE ANTIBODY, QUANTITATIVE  BASIC METABOLIC PANEL  CBC    EKG EKG Interpretation  Date/Time:  Monday November 27 2020 10:38:32 EDT Ventricular Rate:  95 PR Interval:    QRS Duration: 111 QT Interval:  354 QTC Calculation: 445 R Axis:   139 Text Interpretation: Atrial fibrillation Left posterior fascicular block Anterior infarct, old Minimal ST depression, inferior leads Similar to prior tracing Confirmed by Wynona Dove ([id number]) on 11/27/2020 1:22:31 PM  Radiology DG Chest Port 1 View  Result Date: 11/27/2020 CLINICAL DATA:  Fall.  Chest and right foot pain. EXAM: PORTABLE CHEST 1 VIEW COMPARISON:  09/01/2020 FINDINGS: Unchanged mild cardiomegaly. Unchanged mild pulmonary vascular congestion. Small bilateral pleural effusions are not  significantly changed since prior examination. Interval placement of dialysis catheter. Median sternotomy changes again noted. IMPRESSION: No acute abnormality of the chest. Unchanged findings of mild CHF/fluid volume overload. Electronically Signed   By: Miachel Roux M.D.   On: 11/27/2020 11:24   DG Foot 2 Views Right  Result Date: 11/27/2020 CLINICAL DATA:  wound to lateral fore foot EXAM: RIGHT FOOT - 2 VIEW COMPARISON:  Radiograph 08/18/2020 FINDINGS: There is no evidence of acute fracture or dislocation. Unchanged bulky ossification along the base of the fifth metatarsal, possibly from prior trauma. There is a lateral forefoot soft tissue defect. There is no frank bony destruction radiographically. Mild midfoot and forefoot degenerative changes. Plantar and dorsal calcaneal spurring. Vascular calcifications. IMPRESSION: Lateral forefoot soft tissue defect. No frank bony destruction radiographically to suggest osteomyelitis. MRI would be more sensitive. Electronically Signed   By: Maurine Simmering M.D.   On: 11/27/2020 11:32    Procedures .Critical Care E&M Performed by: Jeanell Sparrow, DO  Critical care provider statement:    Critical care time (minutes):  41   Critical care time was exclusive of:  Separately billable procedures and treating other patients   Critical care was necessary to treat or prevent imminent or life-threatening deterioration of the following conditions:  Cardiac failure   Critical care was time spent personally by me on the following activities:  Development of treatment plan with patient or surrogate, discussions with consultants, evaluation of patient's response to treatment, examination of patient and obtaining history from patient or surrogate   Care discussed with: admitting provider   After initial E/M assessment, critical care services were subsequently performed that were exclusive of separately billable procedures or treatment.     Medications Ordered in  ED Medications  calcium acetate (PHOSLO) capsule 1,334 mg (1,334 mg Oral Given 11/27/20 1741)  pantoprazole (PROTONIX) EC tablet 40 mg (40 mg Oral Given 11/27/20 1742)  ferrous sulfate tablet 325 mg (has no administration in time range)  fluticasone (FLONASE) 50 MCG/ACT nasal spray 2 spray (has no administration in time range)  acetaminophen (TYLENOL) tablet 650 mg (has no administration in time range)    Or  acetaminophen (TYLENOL) suppository 650 mg (has no administration in time range)  ondansetron (ZOFRAN) tablet 4 mg (has no administration in time range)    Or  ondansetron (ZOFRAN) injection 4 mg (has no administration in time range)  Chlorhexidine Gluconate Cloth 2 % PADS 6 each (has no administration in time range)  pentafluoroprop-tetrafluoroeth (GEBAUERS) aerosol 1 application (has no administration in time range)  lidocaine (PF) (XYLOCAINE) 1 % injection 5 mL (has no administration in time range)  lidocaine-prilocaine (EMLA) cream 1 application (has no administration in time range)  0.9 %  sodium chloride infusion (has no administration in time range)  0.9 %  sodium chloride infusion (has no administration in time range)  heparin injection 1,000 Units (has no administration  in time range)  alteplase (CATHFLO ACTIVASE) injection 2 mg (has no administration in time range)  apixaban (ELIQUIS) tablet 5 mg (5 mg Oral Given 11/27/20 1807)  albuterol (PROVENTIL) (2.5 MG/3ML) 0.083% nebulizer solution 2.5 mg (has no administration in time range)  loratadine (CLARITIN) tablet 10 mg (10 mg Oral Given 11/27/20 1742)  aspirin tablet 325 mg (325 mg Oral Given 11/27/20 1336)  heparin bolus via infusion 4,000 Units (4,000 Units Intravenous Bolus from Bag 11/27/20 1358)  heparin injection 2,000 Units (2,000 Units Dialysis Given 11/27/20 1725)    ED Course  I have reviewed the triage vital signs and the nursing notes.  Pertinent labs & imaging results that were available during my care of the  patient were reviewed by me and considered in my medical decision making (see chart for details).    MDM Rules/Calculators/A&P                         This patient complains of dyspnea, fall, weak/fatigue; this involves an extensive number of treatment Options and is a complaint that carries with it a high risk of complications and morbidity. Vital signs reviewed. Pt hypoxic in acute respiratory distress, pulse ox is 78%Serious etiologies considered.   I ordered, reviewed and interpreted labs  Troponin acutely elevated, he is requiring 4L [place], EKG without acute ischemia. Will start heparin for NSTEMI. He has likely pulm hypertension and HFpEF on on HD and diuretics. Follows with Dr Cathie Olden for cardiology. His BNP is >2,700. Will likely need HD (was scheduled to have it completed today.   He received RHC 5 mos ago which was significant for pulm HTN  Pt is moderate risk well's score, sudden onset dyspnea and CP. ESRD on HD.  Collect d-dimer. This is elevated. Would ideally like to schedule CTPE and have HD after this given renal dysfunction, will defer this to inpatient team.   I ordered imaging studies which included CXR, Foot xr and I independently    visualized and interpreted imaging which showed no acute process in chest. Stable CHF. Lateral forefoot soft tissue defect without bony destruction- doubt acute infection or osteomyelitis. Would likely benefit from wound care eval while in ED.   Additional history obtained from family  Previous records obtained and reviewed   I consulted nephrology and discussed lab and imaging findings. Will be able to arrange for HD at [facility]     After the interventions stated above, I reevaluated the patient and found respiratory status remains stable. He continues to require 4L [place]. Recommend admission for resp failure, NSTEMI. Pt and family agreeable. D/w [facility] who accepts patient for admission.     Final Clinical Impression(s) / ED Diagnoses Final  diagnoses:  NSTEMI (non-ST elevated myocardial infarction) ([place])  Acute respiratory failure with hypoxia (HCC)  ESRD (end stage renal disease) ([place])  Wound of foot    Rx / DC Orders ED Discharge Orders     None        Jeanell Sparrow, DO 11/27/20 1846

## 2020-11-27 NOTE — ED Notes (Signed)
Oxygen 78% RA. Pt placed on 3L [place] oxygen now 88-90%

## 2020-11-28 ENCOUNTER — Inpatient Hospital Stay (HOSPITAL_COMMUNITY): Payer: PPO

## 2020-11-28 DIAGNOSIS — R0602 Shortness of breath: Secondary | ICD-10-CM

## 2020-11-28 DIAGNOSIS — Z951 Presence of aortocoronary bypass graft: Secondary | ICD-10-CM

## 2020-11-28 LAB — CBC
HCT: 32.4 % — ABNORMAL LOW (ref 39.0–52.0)
Hemoglobin: 10 g/dL — ABNORMAL LOW (ref 13.0–17.0)
MCH: 27.9 pg (ref 26.0–34.0)
MCHC: 30.9 g/dL (ref 30.0–36.0)
MCV: 90.3 fL (ref 80.0–100.0)
Platelets: 185 10*3/uL (ref 150–400)
RBC: 3.59 MIL/uL — ABNORMAL LOW (ref 4.22–5.81)
RDW: 18.8 % — ABNORMAL HIGH (ref 11.5–15.5)
WBC: 14.7 10*3/uL — ABNORMAL HIGH (ref 4.0–10.5)
nRBC: 0 % (ref 0.0–0.2)

## 2020-11-28 LAB — ECHOCARDIOGRAM LIMITED
Area-P 1/2: 5.02 cm2
Height: 74 in
S' Lateral: 4.3 cm
Weight: 4190.5 oz

## 2020-11-28 LAB — HEPATITIS B SURFACE ANTIBODY, QUANTITATIVE: Hep B S AB Quant (Post): <3.1 m[IU]/mL — ABNORMAL LOW (ref >9.9)

## 2020-11-28 LAB — BASIC METABOLIC PANEL
Anion gap: 10 (ref 5–15)
BUN: 41 mg/dL — ABNORMAL HIGH (ref 8–23)
CO2: 26 mmol/L (ref 22–32)
Calcium: 8.1 mg/dL — ABNORMAL LOW (ref 8.9–10.3)
Chloride: 99 mmol/L (ref 98–111)
Creatinine, Ser: 4.38 mg/dL — ABNORMAL HIGH (ref 0.61–1.24)
GFR, Estimated: 14 mL/min — ABNORMAL LOW (ref >60)
Glucose, Bld: 126 mg/dL — ABNORMAL HIGH (ref 70–99)
Potassium: 3.9 mmol/L (ref 3.5–5.1)
Sodium: 135 mmol/L (ref 135–145)

## 2020-11-28 MED ORDER — ORAL CARE MOUTH RINSE
15.0000 mL | Freq: Two times a day (BID) | OROMUCOSAL | Status: DC
  Administered 2020-11-28 – 2020-12-06 (×14): 15 mL via OROMUCOSAL

## 2020-11-28 MED ORDER — PERFLUTREN LIPID MICROSPHERE
1.0000 mL | INTRAVENOUS | Status: AC | PRN
  Administered 2020-11-28: 4 mL via INTRAVENOUS
  Filled 2020-11-28: qty 10

## 2020-11-28 NOTE — Consult Note (Signed)
[facility] Renal Consultation Note    Indication for Consultation:  Management of ESRD/hemodialysis; anemia, hypertension/volume and secondary hyperparathyroidism  HPI: Travis Blair is a 70 y.o. male with a PMH signiciant for CAD s/p CABG, DM type 2, HTN, HLD, h/o DVT/PE, pulmonary HTN, OSA on CPAP, chronic CHF, and ESRD on HD MWF at [facility] who presented to [facility] ED after falling at home.  He has been complaining of cough, DOE, for 2 weeks and took several medications the day of admission and lost his balance while dressing in the bathroom and was stuck between the commode and wall.  EMS was called and in the ED he was found to be in respiratory distress and placed on supplemental oxygen.  BNP elevated at 2754, D-dimer 4.65, Hgb 9.1.  CXR with mild pulmonary vascular congestion and small bilateral pleural effusions.  He was admitted to ICU for further management and we were consulted to provide dialysis during his hospitalization.  He tolerated 4 hour treatment with 3 liters UF.  He is feeling better today.  He was found to have a diabetic ulcer on his right foot.  Past Medical History:  Diagnosis Date   Anemia associated with chronic renal failure basically   Asthma    as a child   CHF (congestive heart failure) ([place])    Chronic kidney disease    Sees Dr [name]   Constipation    Coronary artery disease    COVID-19    Depression    Diabetes mellitus without complication ([place])    DVT (deep venous thrombosis) ([place])    History of blood clots    Hyperlipidemia    Hypertension    Leg pain    ABIs/LE Arterial US 03/2019: ABIs unreliable; +calcification, no evidence of stenosis   Myocardial infarction [facility])    Obesity    Pneumonia 2018   Pulmonary embolism (HCC)    Sleep apnea    CPAP   SOB (shortness of breath)    Swelling of both lower extremities    Past Surgical History:  Procedure Laterality Date   A/V FISTULAGRAM Left 09/29/2020   Procedure: A/V FISTULAGRAM;   Surgeon: Angelia Mould, MD;  Location: [place] CV LAB;  Service: Cardiovascular;  Laterality: Left;   AV FISTULA PLACEMENT Left 05/04/2020   Procedure: LEFT ARM ARTERIOVENOUS (AV) FISTULA CREATION;  Surgeon: Rosetta Posner, MD;  Location: [facility];  Service: Vascular;  Laterality: Left;   COLONOSCOPY     CORONARY ARTERY BYPASS GRAFT N/A 03/07/2017   Procedure: CORONARY ARTERY BYPASS GRAFTING (CABG), X 3 , USING RIGHT INTERNAL MAMMARY ARTERY, AND RIGHT LEG GREATER SAPHENOUS VEIN HARVESTED ENDOSCOPICALLY;  Surgeon: [name], MD;  Location: [place];  Service: Open Heart Surgery;  Laterality: N/A;   FISTULA SUPERFICIALIZATION Left 10/09/2020   Procedure: FISTULA SUPERFICIALIZATION;  Surgeon: Angelia Mould, MD;  Location: [facility] OR;  Service: Vascular;  Laterality: Left;   HAND SURGERY Right 1991   IR FLUORO GUIDE CV LINE RIGHT  09/04/2020   IR US GUIDE VASC ACCESS RIGHT  09/04/2020   LIGATION OF COMPETING BRANCHES OF ARTERIOVENOUS FISTULA Left 10/09/2020   Procedure: LIGATION OF 2 LARGE COMPETING BRANCHES OF LEFT BRACHIOCEPHALIC FISTULA;  Surgeon: Angelia Mould, MD;  Location: [place];  Service: Vascular;  Laterality: Left;   RIGHT HEART CATH N/A 07/07/2020   Procedure: RIGHT HEART CATH;  Surgeon: Jolaine Artist, MD;  Location: [place] [facility];  Service: Cardiovascular;  Laterality: N/A;  RIGHT/LEFT HEART CATH AND CORONARY ANGIOGRAPHY N/A 12/20/2016   Procedure: RIGHT/LEFT HEART CATH AND CORONARY ANGIOGRAPHY;  Surgeon: [name], MD;  Location: [place] [facility];  Service: Cardiovascular;  Laterality: N/A;   TEE WITHOUT CARDIOVERSION N/A 03/07/2017   Procedure: TRANSESOPHAGEAL ECHOCARDIOGRAM (TEE);  Surgeon: [name], [name], MD;  Location: [place];  Service: Open Heart Surgery;  Laterality: N/A;   TONSILLECTOMY     Family History:   Family History  Problem Relation Age of Onset   Heart disease Mother    Diabetes Mother    Multiple myeloma Mother    Cancer  Father        type unknown, as a child   Heart disease Father    Hypertension Father    Congenital heart disease Father    Hyperlipidemia Father    Lymphoma Sister    Social History:  reports that he has never smoked. He has never used smokeless tobacco. He reports that he does not drink alcohol and does not use drugs. Allergies  Allergen Reactions   Latex Hives and Rash   Lipitor [Atorvastatin]     Leg aches    Prior to Admission medications   Medication Sig Start Date End Date Taking? Authorizing Provider  albuterol (VENTOLIN HFA) 108 (90 Base) MCG/ACT inhaler Inhale 1-2 puffs into the lungs every 6 (six) hours as needed for wheezing or shortness of breath. 07/04/20  Yes [name], MD  calcium acetate (PHOSLO) 667 MG capsule Take 1,334 mg by mouth 3 (three) times daily. 09/19/20  Yes [provider]  clopidogrel (PLAVIX) 75 MG tablet Take 1 tablet (75 mg total) by mouth daily. 11/09/20  Yes Nahser, Wonda Cheng, MD  ferrous sulfate (FERROUSUL) 325 (65 FE) MG tablet Take 1 tablet (325 mg total) by mouth daily with breakfast. 01/28/20  Yes [name], MD  fluticasone (FLONASE) 50 MCG/ACT nasal spray Place 2 sprays into both nostrils daily. Patient taking differently: Place 2 sprays into both nostrils daily as needed for allergies. 05/01/20  Yes [name], MD  levocetirizine (XYZAL) 5 MG tablet Take 5 mg by mouth every evening.   Yes [provider]  pantoprazole (PROTONIX) 40 MG tablet Take 1 tablet (40 mg total) by mouth daily. 08/18/20  Yes [name], MD  Evolocumab (REPATHA SURECLICK) 433 MG/ML SOAJ Inject 1 pen into the skin every 14 (fourteen) days. Patient not taking: Reported on 11/27/2020 06/23/20   Nahser, Wonda Cheng, MD  fluorouracil (EFUDEX) 5 % cream Apply topically 2 (two) times daily. Patient not taking: Reported on 11/27/2020 10/11/20   [provider]   Current Facility-Administered Medications  Medication Dose Route  Frequency Provider Last Rate Last Admin   0.9 %  sodium chloride infusion  100 mL Intravenous PRN Donato Heinz, MD       0.9 %  sodium chloride infusion  100 mL Intravenous PRN Donato Heinz, MD       acetaminophen (TYLENOL) tablet 650 mg  650 mg Oral Q6H PRN Arrien, Jimmy Picket, MD       Or   acetaminophen (TYLENOL) suppository 650 mg  650 mg Rectal Q6H PRN Arrien, Jimmy Picket, MD       albuterol (PROVENTIL) (2.5 MG/3ML) 0.083% nebulizer solution 2.5 mg  2.5 mg Nebulization Q6H PRN Arrien, Jimmy Picket, MD       alteplase (CATHFLO ACTIVASE) injection 2 mg  2 mg Intracatheter Once PRN Donato Heinz, MD       apixaban (  ELIQUIS) tablet 5 mg  5 mg Oral BID Arrien, Jimmy Picket, MD   5 mg at 11/27/20 1807   calcium acetate (PHOSLO) capsule 1,334 mg  1,334 mg Oral TID with meals Arrien, Jimmy Picket, MD   1,334 mg at 11/27/20 1741   Chlorhexidine Gluconate Cloth 2 % PADS 6 each  6 each Topical Q0600 Donato Heinz, MD   6 each at 11/28/20 0524   ferrous sulfate tablet 325 mg  325 mg Oral Q breakfast Arrien, Jimmy Picket, MD   325 mg at 11/28/20 0754   fluticasone (FLONASE) 50 MCG/ACT nasal spray 2 spray  2 spray Each Nare Daily PRN Arrien, Jimmy Picket, MD       heparin injection 1,000 Units  1,000 Units Dialysis PRN Donato Heinz, MD       lidocaine (PF) (XYLOCAINE) 1 % injection 5 mL  5 mL Intradermal PRN Donato Heinz, MD       lidocaine-prilocaine (EMLA) cream 1 application  1 application Topical PRN Donato Heinz, MD       loratadine (CLARITIN) tablet 10 mg  10 mg Oral q1800 Tawni Millers, MD   10 mg at 11/27/20 1742   mupirocin ointment (BACTROBAN) 2 %   Nasal BID Tawni Millers, MD   Given at 11/27/20 2144   ondansetron (ZOFRAN) tablet 4 mg  4 mg Oral Q6H PRN Arrien, Jimmy Picket, MD       Or   ondansetron [facility]) injection 4 mg  4 mg Intravenous Q6H PRN Tawni Millers, MD   4 mg at 11/28/20 0007    pantoprazole (PROTONIX) EC tablet 40 mg  40 mg Oral Daily Tawni Millers, MD   40 mg at 11/27/20 1742   pentafluoroprop-tetrafluoroeth (GEBAUERS) aerosol 1 application  1 application Topical PRN Donato Heinz, MD       Labs: Basic Metabolic Panel: Recent Labs  Lab 11/27/20 1008 11/27/20 1711 11/28/20 0449  NA 136 135 135  K 4.4 3.8 3.9  CL 100 100 99  CO2 $Re'22 24 26  'Kgw$ GLUCOSE 178* 178* 126*  BUN 69* 74* 41*  CREATININE 6.38* 6.75* 4.38*  CALCIUM 8.4* 8.0* 8.1*  PHOS  --  4.4  --    Liver Function Tests: Recent Labs  Lab 11/27/20 1711  ALBUMIN 2.6*   No results for input(s): LIPASE, AMYLASE in the last 168 hours. No results for input(s): AMMONIA in the last 168 hours. CBC: Recent Labs  Lab 11/27/20 1711 11/28/20 0449  WBC 15.8* 14.7*  HGB 9.1* 10.0*  HCT 30.1* 32.4*  MCV 90.4 90.3  PLT 156 185   Cardiac Enzymes: No results for input(s): CKTOTAL, CKMB, CKMBINDEX, TROPONINI in the last 168 hours. CBG: Recent Labs  Lab 11/27/20 2216  GLUCAP 128*   Iron Studies: No results for input(s): IRON, TIBC, TRANSFERRIN, FERRITIN in the last 72 hours. Studies/Results: DG Chest Port 1 View  Result Date: 11/27/2020 CLINICAL DATA:  Fall.  Chest and right foot pain. EXAM: PORTABLE CHEST 1 VIEW COMPARISON:  09/01/2020 FINDINGS: Unchanged mild cardiomegaly. Unchanged mild pulmonary vascular congestion. Small bilateral pleural effusions are not significantly changed since prior examination. Interval placement of dialysis catheter. Median sternotomy changes again noted. IMPRESSION: No acute abnormality of the chest. Unchanged findings of mild CHF/fluid volume overload. Electronically Signed   By: Miachel Roux M.D.   On: 11/27/2020 11:24   DG Foot 2 Views Right  Result Date: 11/27/2020 CLINICAL DATA:  wound to lateral fore foot EXAM: RIGHT FOOT - 2  VIEW COMPARISON:  Radiograph 08/18/2020 FINDINGS: There is no evidence of acute fracture or dislocation. Unchanged bulky  ossification along the base of the fifth metatarsal, possibly from prior trauma. There is a lateral forefoot soft tissue defect. There is no frank bony destruction radiographically. Mild midfoot and forefoot degenerative changes. Plantar and dorsal calcaneal spurring. Vascular calcifications. IMPRESSION: Lateral forefoot soft tissue defect. No frank bony destruction radiographically to suggest osteomyelitis. MRI would be more sensitive. Electronically Signed   By: Maurine Simmering M.D.   On: 11/27/2020 11:32    ROS: Pertinent items are noted in HPI. Physical Exam: Vitals:   11/28/20 0400 11/28/20 0600 11/28/20 0723 11/28/20 0800  BP: 106/61 (!) 126/49  134/62  Pulse: 83 83 80 84  Resp: (!) 26 (!) 24 (!) 28 (!) 27  Temp: 97.9 F (36.6 C)  (!) 100.8 F (38.2 C)   TempSrc: Oral  Oral   SpO2: 94% 98% 98% 95%  Weight:      Height:          Weight change:   Intake/Output Summary (Last 24 hours) at 11/28/2020 0901 Last data filed at 11/28/2020 0600 Gross per 24 hour  Intake 324.4 ml  Output 3000 ml  Net -2675.6 ml   BP 134/62   Pulse 84   Temp (!) 100.8 F (38.2 C) (Oral) Comment: notified nurse  Resp (!) 27   Ht $R'6\' 2"'cy$  (1.88 m)   Wt 118.8 kg   SpO2 95%   BMI 33.63 kg/m  General appearance: alert, cooperative, and no distress Head: Normocephalic, without obvious abnormality, atraumatic Resp: clear to auscultation bilaterally Cardio: regular rate and rhythm, S1, S2 normal, no murmur, click, rub or gallop GI: soft, non-tender; bowel sounds normal; no masses,  no organomegaly Extremities: edema 2+pretibial edema bilaterally and LUE AVF +T/B Dialysis Access:  Dialysis Orders: Center: [facility]  on MWF . EDW 120.5kg HD Bath 3K/2.5Ca  Time 4 hours Heparin 2000 units bolus. Access RIJ TDC (maturing LUE AVF) BFR 400 DFR 800    Calcitriol 0.5 mcg po/HD Venofer  100 mg TIW, Micera 100 mcg IVP every 2 weeks    Assessment/Plan:  Acute hypoxic respiratory failure - presumably due to volume  overload and acute diastolic CHF.  Improved with 3 liters UF on HD and plan for HD again tomorrow.  Will continue to challenge edw (already 2 kg below edw).  ESRD -  continue with HD MWF while he remains an inpatient.  Hypertension/volume  - as above, will continue to challenge edw.  Anemia  - stable  Metabolic bone disease -  continue with home meds  Nutrition -  renal diet, carb modified.  Chronic atrial fibrillation - rate controlled History of DVT/PE - to resume apixaban. Fever  - temp 100.8, will send blood cultures and follow.   Donetta Potts, MD [place] Pager [phone] 11/28/2020, 9:01 AM

## 2020-11-28 NOTE — Discharge Instructions (Addendum)
[facility] Wound Care Instructions: Twice Daily: Cleanse with NS and pat dry. Take a Q tip with saline and probe the wounds (especially the lateral wound toward the top of the foot). Then with a q tip, Apply Silvadene to right lateral foot wounds (lateral and plantar surface), probe the two wounds with a qtip covered with silvadene and pack the area with the silvadene, going up into the tunnel as the lateral ulcer tracks toward the top of the foot about 1 cm, cover with kerlix and sock or ACE wrap.  PLEASE RESUME REGULAR HEMODIALYSIS SCHEDULE.  FOLLOW UP WITH DR. BRIDGES ON 12/21/20 FOR WOUND CHECK.   PLEASE CHECK BLOOD SUGARS AT LEAST 4 TIMES PER DAY AND TREAT PER FACILITY PROTOCOLS.  PLEASE DO WOUND CARE TO FOOT TWICE DAILY PER INSTRUCTIONS.  Continue IV antibiotics with dialysis for full 6 weeks.     Information on my medicine - ELIQUIS (apixaban)  This medication education was reviewed with me or my healthcare representative as part of my discharge preparation.    Why was Eliquis prescribed for you? Eliquis was prescribed for you to reduce the risk of a blood clot forming that can cause a stroke if you have a medical condition called atrial fibrillation (a type of irregular heartbeat).  What do You need to know about Eliquis ? Take your Eliquis TWICE DAILY - one tablet in the morning and one tablet in the evening with or without food. If you have difficulty swallowing the tablet whole please discuss with your pharmacist how to take the medication safely.  Take Eliquis exactly as prescribed by your doctor and DO NOT stop taking Eliquis without talking to the doctor who prescribed the medication.  Stopping may increase your risk of developing a stroke.  Refill your prescription before you run out.  After discharge, you should have regular check-up appointments with your healthcare provider that is prescribing your Eliquis.  In the future your dose may need to be changed if your  kidney function or weight changes by a significant amount or as you get older.  What do you do if you miss a dose? If you miss a dose, take it as soon as you remember on the same day and resume taking twice daily.  Do not take more than one dose of ELIQUIS at the same time to make up a missed dose.  Important Safety Information A possible side effect of Eliquis is bleeding. You should call your healthcare provider right away if you experience any of the following: Bleeding from an injury or your nose that does not stop. Unusual colored urine (red or dark brown) or unusual colored stools (red or black). Unusual bruising for unknown reasons. A serious fall or if you hit your head (even if there is no bleeding).  Some medicines may interact with Eliquis and might increase your risk of bleeding or clotting while on Eliquis. To help avoid this, consult your healthcare provider or pharmacist prior to using any new prescription or non-prescription medications, including herbals, vitamins, non-steroidal anti-inflammatory drugs (NSAIDs) and supplements.  This website has more information on Eliquis (apixaban): http://www.eliquis.com/eliquis/home

## 2020-11-28 NOTE — Progress Notes (Signed)
*  PRELIMINARY RESULTS* Echocardiogram Limited 2-D Echocardiogram  has been performed.  Samuel Germany 11/28/2020, 11:58 AM

## 2020-11-28 NOTE — TOC Initial Note (Signed)
Transition of Care [facility]) - Initial/Assessment Note    Patient Details  Name: Travis Blair MRN: [id number] Date of Birth: 06-Dec-1950  Transition of Care [facility]) CM/SW Contact:    Elliot Gault, LCSW Phone Number: 11/28/2020, 2:00 PM  Clinical Narrative:                  Pt admitted from home. He is high risk for readmission. Met with pt today to assess. Pt resides with his wife at home. He has a 3in1 and a walker. Pt receives outpatient HD at [facility] on MWF schedule. Pt is able to get to appointments and obtain medications as needed. Pt had [facility] RN and PT after his dc from [facility] this past summer and states that if he needs HH again at dc, he would prefer [facility]. Bonita Quin from [facility] states that they will accept pt back if needed at dc.  TOC will follow and assist with dc planning.  Expected Discharge Plan: Home w Home Health Services Barriers to Discharge: Continued Medical Work up   Patient Goals and CMS Choice Patient states their goals for this hospitalization and ongoing recovery are:: go home CMS Medicare.gov Compare Post Acute Care list provided to:: Patient Choice offered to / list presented to : Patient  Expected Discharge Plan and Services Expected Discharge Plan: Home w Home Health Services In-house Referral: Clinical Social Work   Post Acute Care Choice: Home Health Living arrangements for the past 2 months: Single Family Home                                      Prior Living Arrangements/Services Living arrangements for the past 2 months: Single Family Home Lives with:: Spouse Patient language and need for interpreter reviewed:: Yes Do you feel safe going back to the place where you live?: Yes      Need for Family Participation in Patient Care: Yes (Comment) Care giver support system in place?: Yes (comment) Current home services: DME Criminal Activity/Legal Involvement Pertinent to Current Situation/Hospitalization: No - Comment as  needed  Activities of Daily Living Home Assistive Devices/Equipment: Cane (specify quad or straight), Walker (specify type), CBG Meter, CPAP, Eyeglasses ADL Screening (condition at time of admission) Patient's cognitive ability adequate to safely complete daily activities?: No Is the patient deaf or have difficulty hearing?: Yes Does the patient have difficulty seeing, even when wearing glasses/contacts?: No Does the patient have difficulty concentrating, remembering, or making decisions?: No Patient able to express need for assistance with ADLs?: Yes Does the patient have difficulty dressing or bathing?: Yes Independently performs ADLs?: Yes (appropriate for developmental age) Does the patient have difficulty walking or climbing stairs?: Yes Weakness of Legs: Both Weakness of Arms/Hands: None  Permission Sought/Granted Permission sought to share information with : Oceanographer granted to share information with : Yes, Verbal Permission Granted     Permission granted to share info w AGENCY: [facility]        Emotional Assessment Appearance:: Appears younger than stated age Attitude/Demeanor/Rapport: Engaged Affect (typically observed): Pleasant Orientation: : Oriented to Self, Oriented to Place, Oriented to  Time, Oriented to Situation Alcohol / Substance Use: Not Applicable Psych Involvement: No (comment)  Admission diagnosis:  NSTEMI (non-ST elevated myocardial infarction) (HCC) [I21.4] Heart failure (HCC) [I50.9] Chest pain [R07.9] Patient Active Problem List   Diagnosis Date Noted   Heart failure (HCC) 11/27/2020  ESRD (end stage renal disease) ([place]) 11/27/2020   CAD (coronary artery disease) 11/27/2020   Unspecified protein-calorie malnutrition ([place]) 09/14/2020   Hypothyroidism, unspecified 09/09/2020   Allergy, unspecified, initial encounter 09/06/2020   Anaphylactic shock, unspecified, initial encounter 09/06/2020   Anemia in chronic kidney  disease 09/06/2020   Coagulation defect, unspecified ([place]) 09/06/2020   Diarrhea, unspecified 09/06/2020   Dyspnea, unspecified 09/06/2020   Encounter for immunization 09/06/2020   End stage renal disease ([place]) 09/06/2020   Iron deficiency anemia, unspecified 09/06/2020   Old myocardial infarction 09/06/2020   Pain, unspecified 09/06/2020   Personal history of pulmonary embolism 09/06/2020   Pruritus, unspecified 09/06/2020   Secondary hyperparathyroidism of renal origin ([place]) 09/06/2020   Unspecified systolic (congestive) heart failure ([place]) 09/06/2020   Acute on chronic congestive heart failure ([place]) 09/01/2020   CKD (chronic kidney disease) stage 5, GFR less than 15 ml/min (HCC) 09/01/2020   Acute on chronic diastolic CHF (congestive heart failure) ([place]) 09/01/2020   Hypertensive retinopathy of both eyes 02/03/2020   Sleep apnea 01/20/2020   Severe nonproliferative diabetic retinopathy of right eye, with macular edema, associated with type 2 diabetes mellitus ([place]) 01/20/2020   Severe nonproliferative diabetic retinopathy of left eye, with macular edema, associated with type 2 diabetes mellitus ([place]) 01/20/2020   Vitreomacular adhesion of left eye 01/20/2020   Vitreomacular adhesion of right eye 01/20/2020   Statin myopathy 12/06/2019   Hammer toe of right foot 12/25/2018   Deformity of metatarsal bone of right foot 12/25/2018   Coronary artery disease involving native coronary artery of native heart without angina pectoris 07/27/2018   Essential hypertension 07/27/2018   S/P CABG x 3 03/07/2017   Abnormal stress test 12/18/2016   Right atrial dilatation 11/17/2016   Right ventricular dilation 11/17/2016   Near syncope 11/16/2016   Uncontrolled type 2 diabetes mellitus with hyperglycemia, without long-term current use of insulin ([place]) 11/16/2016   Type 2 diabetes mellitus with hyperlipidemia ([place]) 11/16/2016   Hyperlipidemia, unspecified 11/16/2016   Obesity 11/16/2016    Hyponatremia 11/16/2016   AKI (acute kidney injury) ([place]) 11/16/2016   PCP:  [name], MD Pharmacy:   [facility], [place] [address] Phone: [phone] Fax: [phone]     Social Determinants of Health (SDOH) Interventions    Readmission Risk Interventions Readmission Risk Prevention Plan 11/28/2020 09/08/2020  Transportation Screening Complete Complete  PCP or Specialist Appt within 5-7 Days - Complete  Home Care Screening - Complete  Medication Review (RN CM) - Complete  HRI or Home Care Consult Complete -  Social Work Consult for Recovery Care Planning/Counseling Complete -  Palliative Care Screening Not Applicable -  Medication Review (RN Care Manager) Complete -  Some recent data might be hidden

## 2020-11-28 NOTE — Progress Notes (Addendum)
PROGRESS NOTE    Travis Blair  WUJ:[id number] DOB: 1951/01/31 DOA: 11/27/2020 PCP: [name], MD    Brief Narrative:  Travis Blair is being admitted to the hospital with a working diagnosis of acute hypoxic respiratory failure due to volume overload in the setting of of end-stage renal disease.  70 year old male with a significant past medical history for heart failure, chronic atrial fibrillation, hypertension, type 2 diabetes mellitus, coronary artery disease and end-stage renal disease who presents with dyspnea.  Recently started on hemodialysis about 3 months ago.  Cough for the last 2 weeks, last hemodialysis 3 days ago, noted to have increased work of breathing and dyspnea for the last 24 hours.  No chest pain. On his initial physical examination temperature 99.5, blood pressure 145/74, heart rate 94, respirate 20, oxygen saturation 78% on room air, his lungs are rales bilaterally, heart S1-S2, present, irregularly irregular, abdomen soft and nontender, positive 3+ pitting bilateral extremity edema.   Venous pH 7.33, PCO2 42.6, sodium 136, potassium 4.4, chloride 100, bicarb 22, glucose 178, BUN 69, creatinine 6.38, BNP 2754, high sensitive troponin 312-485. D-dimer 4.6. SARS COVID-19 negative   Chest radiograph with cardiomegaly, positive bilateral hilar vascular congestion, interstitial infiltrates, bilateral small pleural effusions.  Hemodialysis catheter right IJ.  EKG 95 bpm, right axis deviation, normal intervals, sinus rhythm with poor R wave progression, no significant ST segment or T wave changes, low voltage. (Besides right axis deviation no significant change from old EKG)     10/17 patient underwent ultrafiltration 3 L with improvement of his symptoms but not yet euvolemic.   Assessment & Plan:   Principal Problem:   Heart failure (HCC) Active Problems:   Type 2 diabetes mellitus with hyperlipidemia (HCC)   Obesity   S/P CABG x 3   Essential  hypertension   ESRD (end stage renal disease) (HCC)   CAD (coronary artery disease)   Acute hypoxemic respiratory failure due to volume overload, acute diastolic heart failure, in the setting of end-stage renal disease. Dyspnea has improved after ultrafiltration ( 3 L) but not yet back to baseline.  His oxygenation is 97% on 2 L/min per [place] No chest pain, EKG with no acute ischemic changes, elevation of troponin due to demand ischemia.    Plan to continue ultrafiltration per nephrology recommendations Wean supplemental 02 off to room air as tolerated, keep oxygen saturation 92% or greater.  Out of bed to chair tid with meals, PT and OT evaluation. Ok to transfer to telemetry ward.  Follow up with limited echocardiogram to rule out wall motion abnormalities.    2.  End-stage renal disease/hemodialysis.   Na is 135, K 3,9 and serum bicarbonate. Continue to have hypervolemia, follow with nephrology recommendations for HD with ultrafiltration.    3.  Ischemic cardiomyopathy, coronary disease status post CABG. Echocardiography from 95/6213 with LV systolic function 60 to 08%.  RV systolic function preserved.  RSVP 57.3.  No significant valvular disease.  Persistent hypervolemia today. No chest pain, patient ruled out for acute coronary syndrome.    4.  History of DVT/PE.   Continue with apixaban, and holding on clopidogrel, to decrease risk of bleeding.    5.  Chronic atrial fibrillation.   Rate continue to be controlled, now on apixaban for anticoagulation Continue with telemetry monitoring.    6.  Type 2 diabetes mellitus.   Glucose continue to be well controlled. Check CBG as needed, continue to hold on insulin therapy.    5.  Iron deficiency anemia/ reactive leukocytosis.  On  iron supplements. No signs of bacterial infection, continue to hold on antibiotic therapy.  He had fever spike this am 100.8 F, follow with blood cultures.    6. Obesity class 2. His calculated BMI is 35.4     Patient continue to be at high risk for worsening hypervolemia.   Status is: Inpatient  Remains inpatient appropriate because:  hypervolemia in the setting of ESRD.    DVT prophylaxis: Apixaban   Code Status:    full  Family Communication:   No family at the bedside     Consultants:  Nephrology    Subjective: Patient with improvement of dyspnea but not yet back to baseline he continue to have dry cough,   Objective: Vitals:   11/28/20 0200 11/28/20 0400 11/28/20 0600 11/28/20 0723  BP: (!) 127/45 106/61 (!) 126/49   Pulse: 87 83 83 80  Resp: 20 (!) 26 (!) 24 (!) 28  Temp:  97.9 F (36.6 C)  (!) 100.8 F (38.2 C)  TempSrc:  Oral  Oral  SpO2: 97% 94% 98% 98%  Weight:      Height:        Intake/Output Summary (Last 24 hours) at 11/28/2020 0823 Last data filed at 11/28/2020 0600 Gross per 24 hour  Intake 324.4 ml  Output 3000 ml  Net -2675.6 ml   Filed Weights   11/27/20 0937 11/27/20 1612 11/27/20 1726  Weight: 122 kg 118.8 kg 118.8 kg    Examination:   General: deconditioned, not in pain. Neurology: Awake and alert, non focal  E ENT: no pallor, no icterus, oral mucosa moist Cardiovascular: No JVD. S1-S2 present, rhythmic, no gallops, rubs, or murmurs. ++ pitting bilateral lower extremity edema. Pulmonary: positive breath sounds bilaterally, adequate air movement, no wheezing, or rhonchi , positive bilateral rales. Gastrointestinal. Abdomen soft and non tender Skin. No rashes Musculoskeletal: no joint deformities     Data Reviewed: I have personally reviewed following labs and imaging studies  CBC: Recent Labs  Lab 11/27/20 1711 11/28/20 0449  WBC 15.8* 14.7*  HGB 9.1* 10.0*  HCT 30.1* 32.4*  MCV 90.4 90.3  PLT 156 144   Basic Metabolic Panel: Recent Labs  Lab 11/27/20 1008 11/27/20 1711 11/28/20 0449  NA 136 135 135  K 4.4 3.8 3.9  CL 100 100 99  CO2 22 24 26   GLUCOSE 178* 178* 126*  BUN 69* 74* 41*  CREATININE 6.38* 6.75* 4.38*   CALCIUM 8.4* 8.0* 8.1*  PHOS  --  4.4  --    GFR: Estimated Creatinine Clearance: 21.8 mL/min (A) (by C-G formula based on SCr of 4.38 mg/dL (H)). Liver Function Tests: Recent Labs  Lab 11/27/20 1711  ALBUMIN 2.6*   No results for input(s): LIPASE, AMYLASE in the last 168 hours. No results for input(s): AMMONIA in the last 168 hours. Coagulation Profile: No results for input(s): INR, PROTIME in the last 168 hours. Cardiac Enzymes: No results for input(s): CKTOTAL, CKMB, CKMBINDEX, TROPONINI in the last 168 hours. BNP (last 3 results) No results for input(s): PROBNP in the last 8760 hours. HbA1C: No results for input(s): HGBA1C in the last 72 hours. CBG: Recent Labs  Lab 11/27/20 2216  GLUCAP 128*   Lipid Profile: No results for input(s): CHOL, HDL, LDLCALC, TRIG, CHOLHDL, LDLDIRECT in the last 72 hours. Thyroid Function Tests: No results for input(s): TSH, T4TOTAL, FREET4, T3FREE, THYROIDAB in the last 72 hours. Anemia Panel: No results for input(s): VITAMINB12, FOLATE,  FERRITIN, TIBC, IRON, RETICCTPCT in the last 72 hours.    Radiology Studies: I have reviewed all of the imaging during this hospital visit personally     Scheduled Meds:  apixaban  5 mg Oral BID   calcium acetate  1,334 mg Oral TID with meals   Chlorhexidine Gluconate Cloth  6 each Topical Q0600   ferrous sulfate  325 mg Oral Q breakfast   loratadine  10 mg Oral q1800   mupirocin ointment   Nasal BID   pantoprazole  40 mg Oral Daily   Continuous Infusions:  sodium chloride     sodium chloride       LOS: 1 day        Future Yeldell Gerome Apley, MD

## 2020-11-29 ENCOUNTER — Encounter (HOSPITAL_COMMUNITY): Payer: Self-pay | Admitting: Internal Medicine

## 2020-11-29 ENCOUNTER — Inpatient Hospital Stay (HOSPITAL_COMMUNITY): Payer: PPO

## 2020-11-29 DIAGNOSIS — R7881 Bacteremia: Secondary | ICD-10-CM

## 2020-11-29 DIAGNOSIS — B9562 Methicillin resistant Staphylococcus aureus infection as the cause of diseases classified elsewhere: Secondary | ICD-10-CM

## 2020-11-29 DIAGNOSIS — J9601 Acute respiratory failure with hypoxia: Secondary | ICD-10-CM

## 2020-11-29 DIAGNOSIS — A4102 Sepsis due to Methicillin resistant Staphylococcus aureus: Secondary | ICD-10-CM

## 2020-11-29 LAB — BASIC METABOLIC PANEL
Anion gap: 10 (ref 5–15)
BUN: 60 mg/dL — ABNORMAL HIGH (ref 8–23)
CO2: 26 mmol/L (ref 22–32)
Calcium: 7.9 mg/dL — ABNORMAL LOW (ref 8.9–10.3)
Chloride: 95 mmol/L — ABNORMAL LOW (ref 98–111)
Creatinine, Ser: 6.1 mg/dL — ABNORMAL HIGH (ref 0.61–1.24)
GFR, Estimated: 9 mL/min — ABNORMAL LOW (ref >60)
Glucose, Bld: 138 mg/dL — ABNORMAL HIGH (ref 70–99)
Potassium: 4.1 mmol/L (ref 3.5–5.1)
Sodium: 131 mmol/L — ABNORMAL LOW (ref 135–145)

## 2020-11-29 LAB — BLOOD CULTURE ID PANEL (REFLEXED) - BCID2
A.calcoaceticus-baumannii: NOT DETECTED
Bacteroides fragilis: NOT DETECTED
Candida albicans: NOT DETECTED
Candida auris: NOT DETECTED
Candida glabrata: NOT DETECTED
Candida krusei: NOT DETECTED
Candida parapsilosis: NOT DETECTED
Candida tropicalis: NOT DETECTED
Cryptococcus neoformans/gattii: NOT DETECTED
Enterobacter cloacae complex: NOT DETECTED
Enterobacterales: NOT DETECTED
Enterococcus Faecium: NOT DETECTED
Enterococcus faecalis: NOT DETECTED
Escherichia coli: NOT DETECTED
Haemophilus influenzae: NOT DETECTED
Klebsiella aerogenes: NOT DETECTED
Klebsiella oxytoca: NOT DETECTED
Klebsiella pneumoniae: NOT DETECTED
Listeria monocytogenes: NOT DETECTED
Meth resistant mecA/C and MREJ: DETECTED — AB
Neisseria meningitidis: NOT DETECTED
Proteus species: NOT DETECTED
Pseudomonas aeruginosa: NOT DETECTED
Salmonella species: NOT DETECTED
Serratia marcescens: NOT DETECTED
Staphylococcus aureus (BCID): DETECTED — AB
Staphylococcus epidermidis: NOT DETECTED
Staphylococcus lugdunensis: NOT DETECTED
Staphylococcus species: DETECTED — AB
Stenotrophomonas maltophilia: NOT DETECTED
Streptococcus agalactiae: NOT DETECTED
Streptococcus pneumoniae: NOT DETECTED
Streptococcus pyogenes: NOT DETECTED
Streptococcus species: DETECTED — AB

## 2020-11-29 LAB — CBC
HCT: 31.9 % — ABNORMAL LOW (ref 39.0–52.0)
Hemoglobin: 9.4 g/dL — ABNORMAL LOW (ref 13.0–17.0)
MCH: 27.3 pg (ref 26.0–34.0)
MCHC: 29.5 g/dL — ABNORMAL LOW (ref 30.0–36.0)
MCV: 92.7 fL (ref 80.0–100.0)
Platelets: 162 10*3/uL (ref 150–400)
RBC: 3.44 MIL/uL — ABNORMAL LOW (ref 4.22–5.81)
RDW: 18.6 % — ABNORMAL HIGH (ref 11.5–15.5)
WBC: 15 10*3/uL — ABNORMAL HIGH (ref 4.0–10.5)
nRBC: 0.1 % (ref 0.0–0.2)

## 2020-11-29 LAB — GLUCOSE, CAPILLARY: Glucose-Capillary: 130 mg/dL — ABNORMAL HIGH (ref 70–99)

## 2020-11-29 MED ORDER — LIDOCAINE HCL (PF) 1 % IJ SOLN
30.0000 mL | Freq: Once | INTRAMUSCULAR | Status: AC
  Administered 2020-11-29: 10 mL via INTRADERMAL
  Filled 2020-11-29: qty 30

## 2020-11-29 MED ORDER — HEPARIN SODIUM (PORCINE) 1000 UNIT/ML IJ SOLN
INTRAMUSCULAR | Status: AC
  Administered 2020-11-29: 3800 [IU] via INTRAVENOUS_CENTRAL
  Filled 2020-11-29: qty 6

## 2020-11-29 MED ORDER — CHLORHEXIDINE GLUCONATE CLOTH 2 % EX PADS
6.0000 | MEDICATED_PAD | Freq: Every day | CUTANEOUS | Status: DC
  Administered 2020-11-29 – 2020-11-30 (×2): 6 via TOPICAL

## 2020-11-29 MED ORDER — VANCOMYCIN HCL IN DEXTROSE 1-5 GM/200ML-% IV SOLN
1000.0000 mg | INTRAVENOUS | Status: DC
  Administered 2020-11-29 – 2020-12-04 (×3): 1000 mg via INTRAVENOUS
  Filled 2020-11-29 (×3): qty 200

## 2020-11-29 MED ORDER — VANCOMYCIN HCL 2000 MG/400ML IV SOLN
2000.0000 mg | Freq: Once | INTRAVENOUS | Status: AC
  Administered 2020-11-29: 2000 mg via INTRAVENOUS
  Filled 2020-11-29: qty 400

## 2020-11-29 MED ORDER — HEPARIN (PORCINE) 25000 UT/250ML-% IV SOLN
1950.0000 [IU]/h | INTRAVENOUS | Status: DC
  Administered 2020-11-29: 1400 [IU]/h via INTRAVENOUS
  Administered 2020-11-30: 1950 [IU]/h via INTRAVENOUS
  Filled 2020-11-29 (×2): qty 250

## 2020-11-29 MED ORDER — HEPARIN SODIUM (PORCINE) 1000 UNIT/ML DIALYSIS: 20.0000 [IU]/kg | INTRAMUSCULAR | Status: DC | PRN

## 2020-11-29 MED ORDER — CALCITRIOL 0.25 MCG PO CAPS
0.5000 ug | ORAL_CAPSULE | ORAL | Status: DC
  Administered 2020-11-29 – 2020-12-08 (×4): 0.5 ug via ORAL
  Filled 2020-11-29 (×6): qty 2

## 2020-11-29 NOTE — Consult Note (Signed)
[facility] Consult  Reason for Consult: Bacteremia, tunneled line in place  Referring Physician:  Dr. Arbutus Leas, Dr. Ronalee Belts  Chief Complaint   Fall     HPI: Travis Blair is a 71 y.o. male with ESRD on MWF dialysis via a tunneled right IJ that was placed July 2022 and a maturing fistula in the LUE.  He has bacteremia and needs his catheter removed. He was admitted 10/17 after a fall at home with complaints of 2 weeks of cough and dyspnea on exertion.  He developed fevers and blood cultures grew MRSA on 10/18.  I was asked to remove his catheter after dialysis today.   He currently has no complaints. He is finishing up dialysis.   Past Medical History:  Diagnosis Date   Anemia associated with chronic renal failure basically   Asthma    as a child   CHF (congestive heart failure) (HCC)    Chronic kidney disease    Sees Dr Hyman Hopes   Constipation    Coronary artery disease    COVID-19    Depression    Diabetes mellitus without complication (HCC)    DVT (deep venous thrombosis) (HCC)    History of blood clots    Hyperlipidemia    Hypertension    Leg pain    ABIs/LE Arterial US 03/2019: ABIs unreliable; +calcification, no evidence of stenosis   Myocardial infarction [facility])    Obesity    Pneumonia 2018   Pulmonary embolism (HCC)    Sleep apnea    CPAP   SOB (shortness of breath)    Swelling of both lower extremities     Past Surgical History:  Procedure Laterality Date   A/V FISTULAGRAM Left 09/29/2020   Procedure: A/V FISTULAGRAM;  Surgeon: Chuck Hint, MD;  Location: [facility] INVASIVE CV LAB;  Service: Cardiovascular;  Laterality: Left;   AV FISTULA PLACEMENT Left 05/04/2020   Procedure: LEFT ARM ARTERIOVENOUS (AV) FISTULA CREATION;  Surgeon: Larina Earthly, MD;  Location: [facility] ORS;  Service: Vascular;  Laterality: Left;   COLONOSCOPY     CORONARY ARTERY BYPASS GRAFT N/A 03/07/2017   Procedure: CORONARY ARTERY BYPASS GRAFTING (CABG), X 3 , USING RIGHT  INTERNAL MAMMARY ARTERY, AND RIGHT LEG GREATER SAPHENOUS VEIN HARVESTED ENDOSCOPICALLY;  Surgeon: Kerin Perna, MD;  Location: [facility] OR;  Service: Open Heart Surgery;  Laterality: N/A;   FISTULA SUPERFICIALIZATION Left 10/09/2020   Procedure: FISTULA SUPERFICIALIZATION;  Surgeon: Chuck Hint, MD;  Location: [facility] OR;  Service: Vascular;  Laterality: Left;   HAND SURGERY Right 1991   IR FLUORO GUIDE CV LINE RIGHT  09/04/2020   IR US GUIDE VASC ACCESS RIGHT  09/04/2020   LIGATION OF COMPETING BRANCHES OF ARTERIOVENOUS FISTULA Left 10/09/2020   Procedure: LIGATION OF 2 LARGE COMPETING BRANCHES OF LEFT BRACHIOCEPHALIC FISTULA;  Surgeon: Chuck Hint, MD;  Location: [facility] OR;  Service: Vascular;  Laterality: Left;   RIGHT HEART CATH N/A 07/07/2020   Procedure: RIGHT HEART CATH;  Surgeon: Dolores Patty, MD;  Location: [facility] INVASIVE CV LAB;  Service: Cardiovascular;  Laterality: N/A;   RIGHT/LEFT HEART CATH AND CORONARY ANGIOGRAPHY N/A 12/20/2016   Procedure: RIGHT/LEFT HEART CATH AND CORONARY ANGIOGRAPHY;  Surgeon: Elder Negus, MD;  Location: [facility] INVASIVE CV LAB;  Service: Cardiovascular;  Laterality: N/A;   TEE WITHOUT CARDIOVERSION N/A 03/07/2017   Procedure: TRANSESOPHAGEAL ECHOCARDIOGRAM (TEE);  Surgeon: Donata Clay, Theron Arista, MD;  Location: [facility] OR;  Service: Open Heart Surgery;  Laterality: N/A;  TONSILLECTOMY      Family History  Problem Relation Age of Onset   Heart disease Mother    Diabetes Mother    Multiple myeloma Mother    Cancer Father        type unknown, as a child   Heart disease Father    Hypertension Father    Congenital heart disease Father    Hyperlipidemia Father    Lymphoma Sister     Social History   Tobacco Use   Smoking status: Never   Smokeless tobacco: Never  Vaping Use   Vaping Use: Never used  Substance Use Topics   Alcohol use: No   Drug use: No    Medications: I have reviewed the patient's current medications. Prior to Admission:   Medications Prior to Admission  Medication Sig Dispense Refill Last Dose   albuterol (VENTOLIN HFA) 108 (90 Base) MCG/ACT inhaler Inhale 1-2 puffs into the lungs every 6 (six) hours as needed for wheezing or shortness of breath. 18 g 3 Past Week   calcium acetate (PHOSLO) 667 MG capsule Take 1,334 mg by mouth 3 (three) times daily.   11/26/2020   clopidogrel (PLAVIX) 75 MG tablet Take 1 tablet (75 mg total) by mouth daily. 90 tablet 3 11/26/2020 at 2100   ferrous sulfate (FERROUSUL) 325 (65 FE) MG tablet Take 1 tablet (325 mg total) by mouth daily with breakfast.  3 11/26/2020   fluticasone (FLONASE) 50 MCG/ACT nasal spray Place 2 sprays into both nostrils daily. (Patient taking differently: Place 2 sprays into both nostrils daily as needed for allergies.) 16 g 6    levocetirizine (XYZAL) 5 MG tablet Take 5 mg by mouth every evening.   11/26/2020   pantoprazole (PROTONIX) 40 MG tablet Take 1 tablet (40 mg total) by mouth daily. 30 tablet 3 11/26/2020   Evolocumab (REPATHA SURECLICK) 875 MG/ML SOAJ Inject 1 pen into the skin every 14 (fourteen) days. (Patient not taking: Reported on 11/27/2020) 6 mL 3 Not Taking   fluorouracil (EFUDEX) 5 % cream Apply topically 2 (two) times daily. (Patient not taking: Reported on 11/27/2020)   Not Taking   Scheduled:  calcitRIOL  0.5 mcg Oral Q M,W,F   calcium acetate  1,334 mg Oral TID with meals   Chlorhexidine Gluconate Cloth  6 each Topical Q0600   Chlorhexidine Gluconate Cloth  6 each Topical Q0600   loratadine  10 mg Oral q1800   mouth rinse  15 mL Mouth Rinse BID   mupirocin ointment   Nasal BID   pantoprazole  40 mg Oral Daily   Continuous:  sodium chloride     sodium chloride     heparin     vancomycin 1,000 mg (11/29/20 1400)   IEP:PIRJJO chloride, sodium chloride, acetaminophen **OR** acetaminophen, albuterol, alteplase, fluticasone, heparin, heparin, lidocaine (PF), lidocaine-prilocaine, ondansetron **OR** ondansetron (ZOFRAN) IV,  pentafluoroprop-tetrafluoroeth  Allergies  Allergen Reactions   Latex Hives and Rash   Lipitor [Atorvastatin]     Leg aches      ROS:  A comprehensive review of systems was negative except for: Constitutional: positive for malaise Respiratory: positive for cough Musculoskeletal: positive for elbow pain on right after fall  Blood pressure 117/68, pulse 97, temperature 98.2 F (36.8 C), temperature source Oral, resp. rate (!) 30, height $RemoveBe'6\' 2"'DCNdJGLTD$  (1.88 m), weight 117.9 kg, SpO2 99 %. Physical Exam Vitals reviewed.  Constitutional:      Appearance: Normal appearance.  HENT:     Head: Normocephalic.  Nose: Nose normal.     Mouth/Throat:     Mouth: Mucous membranes are moist.  Eyes:     Extraocular Movements: Extraocular movements intact.  Cardiovascular:     Rate and Rhythm: Normal rate.  Pulmonary:     Effort: Pulmonary effort is normal.  Chest:     Comments: Right chest tunneled line in place Abdominal:     General: There is no distension.     Palpations: Abdomen is soft.     Tenderness: There is no abdominal tenderness.  Musculoskeletal:        General: Normal range of motion.  Skin:    General: Skin is warm.  Neurological:     General: No focal deficit present.     Mental Status: He is alert and oriented to person, place, and time.  Psychiatric:        Mood and Affect: Mood normal.        Behavior: Behavior normal.        Thought Content: Thought content normal.        Judgment: Judgment normal.    Results: Results for orders placed or performed during the hospital encounter of 11/27/20 (from the past 48 hour(s))  MRSA Next Gen by PCR, Nasal     Status: Abnormal   Collection Time: 11/27/20  4:10 PM   Specimen: Nasal Mucosa; Nasal Swab  Result Value Ref Range   MRSA by PCR Next Gen DETECTED (A) NOT DETECTED    Comment: RESULT CALLED TO, READ BACK BY AND VERIFIED WITH: HEATHER TETREAULT,1951,11/27/2020,SELF S. (NOTE) The GeneXpert MRSA Assay (FDA approved  for NASAL specimens only), is one component of a comprehensive MRSA colonization surveillance program. It is not intended to diagnose MRSA infection nor to guide or monitor treatment for MRSA infections. Test performance is not FDA approved in patients less than 49 years old. Performed at [facility], [address] [place], [place] [address]   Renal function panel     Status: Abnormal   Collection Time: 11/27/20  5:11 PM  Result Value Ref Range   Sodium 135 135 - 145 mmol/L   Potassium 3.8 3.5 - 5.1 mmol/L   Chloride 100 98 - 111 mmol/L   CO2 24 22 - 32 mmol/L   Glucose, Bld 178 (H) 70 - 99 mg/dL    Comment: Glucose reference range applies only to samples taken after fasting for at least 8 hours.   BUN 74 (H) 8 - 23 mg/dL   Creatinine, Ser 6.75 (H) 0.61 - 1.24 mg/dL   Calcium 8.0 (L) 8.9 - 10.3 mg/dL   Phosphorus 4.4 2.5 - 4.6 mg/dL   Albumin 2.6 (L) 3.5 - 5.0 g/dL   GFR, Estimated 8 (L) >60 mL/min    Comment: (NOTE) Calculated using the CKD-EPI Creatinine Equation (2021)    Anion gap 11 5 - 15    Comment: Performed at [facility], [address], [place], [place] [address]  CBC     Status: Abnormal   Collection Time: 11/27/20  5:11 PM  Result Value Ref Range   WBC 15.8 (H) 4.0 - 10.5 K/uL   RBC 3.33 (L) 4.22 - 5.81 MIL/uL   Hemoglobin 9.1 (L) 13.0 - 17.0 g/dL   HCT 30.1 (L) 39.0 - 52.0 %   MCV 90.4 80.0 - 100.0 fL   MCH 27.3 26.0 - 34.0 pg   MCHC 30.2 30.0 - 36.0 g/dL   RDW 18.8 (H) 11.5 - 15.5 %   Platelets 156 150 -  400 K/uL   nRBC 0.0 0.0 - 0.2 %    Comment: Performed at [facility], [address] [place], [place] [address]  Hepatitis B surface antigen     Status: None   Collection Time: 11/27/20  5:11 PM  Result Value Ref Range   Hepatitis B Surface Ag NON REACTIVE NON REACTIVE    Comment: Performed at [facility] [address], [place], [place] [address]  Hepatitis B surface antibody     Status: None   Collection Time: 11/27/20  5:11 PM  Result Value  Ref Range   Hep B S Ab NON REACTIVE NON REACTIVE    Comment: (NOTE) Inconsistent with immunity, less than 10 mIU/mL.  Performed at [facility], [place] [address] [place], [place] [address]   Hepatitis B surface antibody,quantitative     Status: Abnormal   Collection Time: 11/27/20  5:11 PM  Result Value Ref Range   Hepatitis B-Post <3.1 (L) Immunity>9.9 mIU/mL    Comment: (NOTE)  Status of Immunity                     Anti-HBs Level  ------------------                     -------------- Inconsistent with Immunity                   0.0 - 9.9 Consistent with Immunity                          >9.9 Performed At: [facility] [place], [place] [id number] Rush Farmer MD NL:[id number]   Glucose, capillary     Status: Abnormal   Collection Time: 11/27/20 10:16 PM  Result Value Ref Range   Glucose-Capillary 128 (H) 70 - 99 mg/dL    Comment: Glucose reference range applies only to samples taken after fasting for at least 8 hours.   Comment 1 Notify RN    Comment 2 Document in Chart   Basic metabolic panel     Status: Abnormal   Collection Time: 11/28/20  4:49 AM  Result Value Ref Range   Sodium 135 135 - 145 mmol/L   Potassium 3.9 3.5 - 5.1 mmol/L   Chloride 99 98 - 111 mmol/L   CO2 26 22 - 32 mmol/L   Glucose, Bld 126 (H) 70 - 99 mg/dL    Comment: Glucose reference range applies only to samples taken after fasting for at least 8 hours.   BUN 41 (H) 8 - 23 mg/dL   Creatinine, Ser 4.38 (H) 0.61 - 1.24 mg/dL    Comment: DELTA CHECK NOTED   Calcium 8.1 (L) 8.9 - 10.3 mg/dL   GFR, Estimated 14 (L) >60 mL/min    Comment: (NOTE) Calculated using the CKD-EPI Creatinine Equation (2021)    Anion gap 10 5 - 15    Comment: Performed at [facility], [address], [place], [place] [address]  CBC     Status: Abnormal   Collection Time: 11/28/20  4:49 AM  Result Value Ref Range   WBC 14.7 (H) 4.0 - 10.5 K/uL   RBC 3.59 (L) 4.22 - 5.81 MIL/uL   Hemoglobin  10.0 (L) 13.0 - 17.0 g/dL   HCT 32.4 (L) 39.0 - 52.0 %   MCV 90.3 80.0 - 100.0 fL   MCH 27.9 26.0 - 34.0 pg   MCHC 30.9 30.0 -  36.0 g/dL   RDW 18.8 (H) 11.5 - 15.5 %   Platelets 185 150 - 400 K/uL   nRBC 0.0 0.0 - 0.2 %    Comment: Performed at [facility], [address] [place], [place] [address]  Culture, blood (Routine X 2) w Reflex to ID Panel     Status: None (Preliminary result)   Collection Time: 11/28/20  9:34 AM   Specimen: BLOOD RIGHT HAND  Result Value Ref Range   Specimen Description      BLOOD RIGHT HAND Performed at [facility], [address], [place], [place] [address]    Special Requests      Immunocompromised Blood Culture adequate volume BOTTLES DRAWN AEROBIC AND ANAEROBIC Performed at [facility], [address], [place], [place] [address]    Culture  Setup Time      GRAM POSITIVE COCCI ANAEROBIC BOTTLE ONLY Gram Stain Report Called to,Read Back By and Verified WithLynwood Dawley, 2216, 11/28/2020,SELF S aerobic bottle previously called 10.18.22 2353 matthews, b  Performed at [facility], [address], [place], [place] [address]    Culture GRAM POSITIVE COCCI    Report Status PENDING   Culture, blood (Routine X 2) w Reflex to ID Panel     Status: None (Preliminary result)   Collection Time: 11/28/20  9:34 AM   Specimen: BLOOD RIGHT FOREARM  Result Value Ref Range   Specimen Description      BLOOD RIGHT FOREARM Performed at [facility], [address] [place], [place] [address]    Special Requests      Immunocompromised Blood Culture adequate volume BOTTLES DRAWN AEROBIC AND ANAEROBIC Performed at [facility], [address] [place], [place] [address]    Culture  Setup Time      GRAM POSITIVE COCCI IN BOTH AEROBIC AND ANAEROBIC BOTTLES Gram Stain Report Called to,Read Back By and Verified With: T.ALLEN,2236,11/28/2020,SELF S aerobic bottle [facility] previously called matthews, b 10.18.22 2355 CRITICAL RESULT CALLED TO, READ BACK BY AND VERIFIED WITH: T  ALLEN,RN$RemoveBe'@0209'[id number]$  11/29/20 [place] Performed at [facility], [place] [address] [place], [place] [address]    Culture GRAM POSITIVE COCCI    Report Status PENDING   Blood Culture ID Panel (Reflexed)     Status: Abnormal   Collection Time: 11/28/20  9:34 AM  Result Value Ref Range   Enterococcus faecalis NOT DETECTED NOT DETECTED   Enterococcus Faecium NOT DETECTED NOT DETECTED   Listeria monocytogenes NOT DETECTED NOT DETECTED   Staphylococcus species DETECTED (A) NOT DETECTED    Comment: CRITICAL RESULT CALLED TO, READ BACK BY AND VERIFIED WITH: T ALLEN,RN$RemoveBeforeD'@0209'LmXQlHDcTVhWEf$  11/29/20 [place]    Staphylococcus aureus (BCID) DETECTED (A) NOT DETECTED    Comment: Methicillin (oxacillin)-resistant Staphylococcus aureus (MRSA). MRSA is predictably resistant to beta-lactam antibiotics (except ceftaroline). Preferred therapy is vancomycin unless clinically contraindicated. Patient requires contact precautions if  hospitalized. CRITICAL RESULT CALLED TO, READ BACK BY AND VERIFIED WITH: T ALLEN,RN$RemoveBeforeD'@0209'fPwBOcpVoliIEu$  11/29/20 [place]    Staphylococcus epidermidis NOT DETECTED NOT DETECTED   Staphylococcus lugdunensis NOT DETECTED NOT DETECTED   Streptococcus species DETECTED (A) NOT DETECTED    Comment: Not Enterococcus species, Streptococcus agalactiae, Streptococcus pyogenes, or Streptococcus pneumoniae. CRITICAL RESULT CALLED TO, READ BACK BY AND VERIFIED WITH: T ALLEN,RN@ 0209 11/29/20 [place]    Streptococcus agalactiae NOT DETECTED NOT DETECTED   Streptococcus pneumoniae NOT DETECTED NOT DETECTED   Streptococcus pyogenes NOT DETECTED NOT DETECTED   A.calcoaceticus-baumannii NOT DETECTED NOT DETECTED   Bacteroides fragilis NOT DETECTED  NOT DETECTED   Enterobacterales NOT DETECTED NOT DETECTED   Enterobacter cloacae complex NOT DETECTED NOT DETECTED   Escherichia coli NOT DETECTED NOT DETECTED   Klebsiella aerogenes NOT DETECTED NOT DETECTED   Klebsiella oxytoca NOT DETECTED NOT DETECTED   Klebsiella pneumoniae NOT DETECTED NOT  DETECTED   Proteus species NOT DETECTED NOT DETECTED   Salmonella species NOT DETECTED NOT DETECTED   Serratia marcescens NOT DETECTED NOT DETECTED   Haemophilus influenzae NOT DETECTED NOT DETECTED   Neisseria meningitidis NOT DETECTED NOT DETECTED   Pseudomonas aeruginosa NOT DETECTED NOT DETECTED   Stenotrophomonas maltophilia NOT DETECTED NOT DETECTED   Candida albicans NOT DETECTED NOT DETECTED   Candida auris NOT DETECTED NOT DETECTED   Candida glabrata NOT DETECTED NOT DETECTED   Candida krusei NOT DETECTED NOT DETECTED   Candida parapsilosis NOT DETECTED NOT DETECTED   Candida tropicalis NOT DETECTED NOT DETECTED   Cryptococcus neoformans/gattii NOT DETECTED NOT DETECTED   Meth resistant mecA/C and MREJ DETECTED (A) NOT DETECTED    Comment: CRITICAL RESULT CALLED TO, READ BACK BY AND VERIFIED WITH: T ALLEN,RN$RemoveBeforeD'@0209'[id number]$  11/29/20 [place] Performed at [facility], [address] [place], [place] [address]   Basic metabolic panel     Status: Abnormal   Collection Time: 11/29/20  4:40 AM  Result Value Ref Range   Sodium 131 (L) 135 - 145 mmol/L   Potassium 4.1 3.5 - 5.1 mmol/L   Chloride 95 (L) 98 - 111 mmol/L   CO2 26 22 - 32 mmol/L   Glucose, Bld 138 (H) 70 - 99 mg/dL    Comment: Glucose reference range applies only to samples taken after fasting for at least 8 hours.   BUN 60 (H) 8 - 23 mg/dL   Creatinine, Ser 6.10 (H) 0.61 - 1.24 mg/dL   Calcium 7.9 (L) 8.9 - 10.3 mg/dL   GFR, Estimated 9 (L) >60 mL/min    Comment: (NOTE) Calculated using the CKD-EPI Creatinine Equation (2021)    Anion gap 10 5 - 15    Comment: Performed at [facility], [address], [place], [place] [address]  CBC     Status: Abnormal   Collection Time: 11/29/20  4:40 AM  Result Value Ref Range   WBC 15.0 (H) 4.0 - 10.5 K/uL   RBC 3.44 (L) 4.22 - 5.81 MIL/uL   Hemoglobin 9.4 (L) 13.0 - 17.0 g/dL   HCT 31.9 (L) 39.0 - 52.0 %   MCV 92.7 80.0 - 100.0 fL   MCH 27.3 26.0 - 34.0 pg   MCHC 29.5 (L)  30.0 - 36.0 g/dL   RDW 18.6 (H) 11.5 - 15.5 %   Platelets 162 150 - 400 K/uL   nRBC 0.1 0.0 - 0.2 %    Comment: Performed at [facility], [address] [place],  [address]    ECHOCARDIOGRAM LIMITED  Result Date: 11/28/2020    ECHOCARDIOGRAM LIMITED REPORT   Patient Name:   Travis Blair [facility] Date of Exam: 11/28/2020 Medical Rec #:  [id number]           Height:       74.0 in Accession #:    [id number]          Weight:       261.9 lb Date of Birth:  1950/11/08           BSA:          2.437 m Patient Age:    16 years  BP:           124/56 mmHg Patient Gender: M                   HR:           72 bpm. Exam Location:  [facility] Procedure: Limited Echo, Color Doppler and Cardiac Doppler Indications:    Dyspnea R06.00  History:        Patient has prior history of Echocardiogram examinations, most                 recent 06/06/2020. CAD, Prior CABG, Arrythmias:Atrial                 Fibrillation, Signs/Symptoms:Shortness of Breath; Risk                 Factors:Hypertension, Diabetes and Dyslipidemia.  Sonographer:    Alvino Chapel RCS Referring Phys: [id number] [place]  1. Left ventricular ejection fraction, by estimation, is 45 to 50%. The left ventricle has mildly decreased function. The left ventricle demonstrates regional wall motion abnormalities (see scoring diagram/findings for description). There is severe asymmetric left ventricular hypertrophy of the basal and septal segments. Left ventricular diastolic parameters are indeterminate. There is the interventricular septum is flattened in systole and diastole, consistent with right ventricular pressure and volume overload.  2. Right ventricular systolic function is low normal. The right ventricular size is mildly enlarged. There is moderately elevated pulmonary artery systolic pressure. The estimated right ventricular systolic pressure is 43.1 mmHg.  3. Left atrial size was mildly dilated.  4. Right atrial size was  mildly dilated.  5. Left pleural effusion evident. A small pericardial effusion is present. The pericardial effusion is posterior to the left ventricle.  6. The mitral valve is abnormal. Mild mitral valve regurgitation.  7. The aortic valve is tricuspid. There is moderate calcification of the aortic valve. Aortic valve regurgitation is not visualized.  8. The inferior vena cava is dilated in size with <50% respiratory variability, suggesting right atrial pressure of 15 mmHg. Comparison(s): Prior images reviewed side by side. Reduced LVEF and wall motion abnormalities are not new based on my review of the prior study. FINDINGS  Left Ventricle: Left ventricular ejection fraction, by estimation, is 45 to 50%. The left ventricle has mildly decreased function. The left ventricle demonstrates regional wall motion abnormalities. Definity contrast agent was given IV to delineate the left ventricular endocardial borders. There is severe asymmetric left ventricular hypertrophy of the basal and septal segments. The interventricular septum is flattened in systole and diastole, consistent with right ventricular pressure and volume overload. Left ventricular diastolic parameters are indeterminate. Left ventricular diastolic function could not be evaluated due to atrial fibrillation.  LV Wall Scoring: The basal inferior segment is akinetic. The posterior wall, mid inferoseptal segment, mid inferior segment, and basal inferoseptal segment are hypokinetic. The entire anterior wall, antero-lateral wall, entire anterior septum, and entire apex are normal. Right Ventricle: The right ventricular size is mildly enlarged. No increase in right ventricular wall thickness. Right ventricular systolic function is low normal. There is moderately elevated pulmonary artery systolic pressure. The tricuspid regurgitant  velocity is 3.12 m/s, and with an assumed right atrial pressure of 15 mmHg, the estimated right ventricular systolic pressure is  54.0 mmHg. Left Atrium: Left atrial size was mildly dilated. Right Atrium: Right atrial size was mildly dilated. Pericardium: Left pleural effusion evident. A small pericardial effusion is present. The pericardial effusion is posterior to  the left ventricle. Mitral Valve: The mitral valve is abnormal. There is mild thickening of the mitral valve leaflet(s). There is mild calcification of the mitral valve leaflet(s). Mild mitral valve regurgitation. Tricuspid Valve: The tricuspid valve is grossly normal. Tricuspid valve regurgitation is mild. Aortic Valve: The aortic valve is tricuspid. There is moderate calcification of the aortic valve. There is mild to moderate aortic valve annular calcification. Aortic valve regurgitation is not visualized. Aorta: The aortic root is normal in size and structure. Venous: The inferior vena cava is dilated in size with less than 50% respiratory variability, suggesting right atrial pressure of 15 mmHg. IAS/Shunts: No atrial level shunt detected by color flow Doppler. LEFT VENTRICLE PLAX 2D LVIDd:         5.50 cm LVIDs:         4.30 cm LV PW:         1.30 cm LV IVS:        1.80 cm LVOT diam:     2.30 cm LVOT Area:     4.15 cm  RIGHT VENTRICLE TAPSE (M-mode): 1.7 cm LEFT ATRIUM              Index        RIGHT ATRIUM           Index LA diam:        4.20 cm  1.72 cm/m   RA Area:     27.30 cm LA Vol (A2C):   102.0 ml 41.85 ml/m  RA Volume:   96.50 ml  39.59 ml/m LA Vol (A4C):   74.5 ml  30.57 ml/m LA Biplane Vol: 91.8 ml  37.66 ml/m   AORTA Ao Root diam: 3.80 cm MITRAL VALVE                TRICUSPID VALVE MV Area (PHT): 5.02 cm     TR Peak grad:   38.9 mmHg MV Decel Time: 151 msec     TR Vmax:        312.00 cm/s MV E velocity: 143.00 cm/s                             SHUNTS                             Systemic Diam: 2.30 cm Rozann Lesches MD Electronically signed by Rozann Lesches MD Signature Date/Time: 11/28/2020/1:03:27 PM    Final      Assessment & Plan:  Travis Blair is a 70 y.o. male with a tunneled R IJ that was placed by IR 08/2020. His fistula was not completely successful today so he may nee da temporary catheter Friday and a repeat tunneled next week. Discussed removal of the tunneled catheter with him and his wife, risk of bleeding especially with the Eliquis, risk of infection and incomplete removal.    -See procedure note for removal -May need another catheter, TBD -Hold eliquis   All questions were answered to the satisfaction of the patient and family.    Virl Cagey 11/29/2020, 3:56 PM

## 2020-11-29 NOTE — Procedures (Signed)
[facility] Procedure Note  11/29/20  Pre procedure Diagnosis: Bacteremia, tunneled right internal jugular dialysis line in place    Post procedure Diagnosis: Same   Procedure(s) Performed: Removal of tunneled dialysis line    Surgeon: Lanell Matar. Constance Haw, MD   Assistants: No qualified resident was available    Anesthesia: 1% lidocaine 10cc    Specimens: None    Estimated Blood Loss: Minimal   Blood Replacement: None    Complications: None   Wound Class: Infected    Procedure Indications: Mr. Travis Blair is a 70 yo with bacteremia and a tunneled line in place that needs removal. We discussed removal while he is on Eliquis and risk of bleeding, infection, incomplete removal.  We discussed that the Eliquis increases the risk of bleeding.   Findings: Tunneled line with scarred in cuff    Procedure: The patient was placed semi upright in his bed. The catheter dressing was removed. The catheter was prepped and draped. Lidocaine 1% was injected around the catheter and at the entry site in the internal jugular.  Using sharp dissection with scissors and blunt dissection, freed the catheter from the tunnel. A counter incision was needed at the internal jugular site and additional sharp and blunt dissection with scissors freed the line.  The line was removed in its entirety. An assistant held pressure for 30 minutes.  Once the pressure was held, and hemostasis was confirmed. No hematoma was noted. The counter incision was closed with 3-0 Vicryl suture. Both the counter incision and the skin entry site were covered with gauze and paper tape. A CXR was obtained to verify removal and no pneumothorax.    Curlene Labrum, MD [facility] [address] [place], [place] [address] [phone] (office)

## 2020-11-29 NOTE — Consult Note (Signed)
    Travis Blair for Infectious Disease   Date of Admission:  11/27/2020     Reason for visit: CHAMP auto consult  Interval History:  70 yo man with CAD, DM, HTN, HLD, OSA, ESRD on HD via right IJ [facility] admitted 11/27/20 with fall at home and complaint of 2 weeks cough, DOE.  On admission, found to be in respiratory distress and placed on supplemental oxygen.  Also found to have a diabetic foot ulcer.   Developed fevers and had blood cultures obtained 10/18 --> GPC in 3/4 bottles.  BCID notes MRSA and strep spp.  Started on vancomycin 10/18.  TTE yesterday without obvious vegetations noted.  Planning for HD catheter removal later today after HD per nephrology notes.   Assessment:  MRSA bacteremia Acute hypoxic respiratory failure presumed to volume overload, acute CHF ESRD via RIJ TDC (maturing fistula) Right DFU: x-ray negative  Recommendations: --Continue vancomycin --Agree with catheter removal --TEE --MRI right foot to evaluate for OM, may need vascular evaluation to ensure adequate blood flow to lower extremities --Repeat blood cultures ~48 hrs after appropriate antibiotics --Will follow   Travis Blair for [facility] [phone] pager 11/29/2020, 9:06 AM

## 2020-11-29 NOTE — NC FL2 (Signed)
[place] MEDICAID FL2 LEVEL OF CARE SCREENING TOOL     IDENTIFICATION  Patient Name: Travis Blair [facility] Birthdate: Nov 09, 1950 Sex: male Admission Date (Current Location): 11/27/2020  [facility] and [place] Number:  [employer] and Address:  [place] [address], [place]      Provider Number: [id number]  Attending Physician Name and Address:  Orson Eva, MD  Relative Name and Phone Number:       Current Level of Care: Hospital Recommended Level of Care: [place] Prior Approval Number:    Date Approved/Denied:   PASRR Number: [id number]  Discharge Plan: SNF    Current Diagnoses: Patient Active Problem List   Diagnosis Date Noted   Heart failure ([place]) 11/27/2020   ESRD (end stage renal disease) ([place]) 11/27/2020   CAD (coronary artery disease) 11/27/2020   Unspecified protein-calorie malnutrition ([place]) 09/14/2020   Hypothyroidism, unspecified 09/09/2020   Allergy, unspecified, initial encounter 09/06/2020   Anaphylactic shock, unspecified, initial encounter 09/06/2020   Anemia in chronic kidney disease 09/06/2020   Coagulation defect, unspecified ([place]) 09/06/2020   Diarrhea, unspecified 09/06/2020   Dyspnea, unspecified 09/06/2020   Encounter for immunization 09/06/2020   End stage renal disease ([place]) 09/06/2020   Iron deficiency anemia, unspecified 09/06/2020   Old myocardial infarction 09/06/2020   Pain, unspecified 09/06/2020   Personal history of pulmonary embolism 09/06/2020   Pruritus, unspecified 09/06/2020   Secondary hyperparathyroidism of renal origin ([place]) 09/06/2020   Unspecified systolic (congestive) heart failure ([place]) 09/06/2020   Acute on chronic congestive heart failure ([place]) 09/01/2020   CKD (chronic kidney disease) stage 5, GFR less than 15 ml/min ([place]) 09/01/2020   Acute on chronic diastolic CHF (congestive heart failure) ([place]) 09/01/2020   Hypertensive retinopathy of both eyes  02/03/2020   Sleep apnea 01/20/2020   Severe nonproliferative diabetic retinopathy of right eye, with macular edema, associated with type 2 diabetes mellitus ([place]) 01/20/2020   Severe nonproliferative diabetic retinopathy of left eye, with macular edema, associated with type 2 diabetes mellitus ([place]) 01/20/2020   Vitreomacular adhesion of left eye 01/20/2020   Vitreomacular adhesion of right eye 01/20/2020   Statin myopathy 12/06/2019   Hammer toe of right foot 12/25/2018   Deformity of metatarsal bone of right foot 12/25/2018   Coronary artery disease involving native coronary artery of native heart without angina pectoris 07/27/2018   Essential hypertension 07/27/2018   S/P CABG x 3 03/07/2017   Abnormal stress test 12/18/2016   Right atrial dilatation 11/17/2016   Right ventricular dilation 11/17/2016   Near syncope 11/16/2016   Uncontrolled type 2 diabetes mellitus with hyperglycemia, without long-term current use of insulin ([place]) 11/16/2016   Type 2 diabetes mellitus with hyperlipidemia ([place]) 11/16/2016   Hyperlipidemia, unspecified 11/16/2016   Obesity 11/16/2016   Hyponatremia 11/16/2016   AKI (acute kidney injury) ([place]) 11/16/2016    Orientation RESPIRATION BLADDER Height & Weight     Time, Self, Place, Situation  O2 (2L) Continent Weight: 259 lb 14.8 oz (117.9 kg) Height:  6\' 2"  (188 cm)  BEHAVIORAL SYMPTOMS/MOOD NEUROLOGICAL BOWEL NUTRITION STATUS      Continent Diet (Heart healthy. See d/c summary for updates.)  AMBULATORY STATUS COMMUNICATION OF NEEDS Skin   Extensive Assist Verbally Other (Comment) (diabetic ulcer left foot. Skin tear to arm.)                       Personal Care Assistance Level of Assistance  Bathing, Feeding, Dressing  Bathing Assistance: Maximum assistance Feeding assistance: Limited assistance Dressing Assistance: Maximum assistance     Functional Limitations Info  Sight, Hearing, Speech Sight Info: Adequate Hearing Info:  Adequate Speech Info: Adequate    SPECIAL CARE FACTORS FREQUENCY  PT (By licensed PT)     PT Frequency: 5x weekly              Contractures      Additional Factors Info  Code Status, Allergies Code Status Info: Full code Allergies Info: Latex, Lipitor (atorvastatin)           Current Medications (11/29/2020):  This is the current hospital active medication list Current Facility-Administered Medications  Medication Dose Route Frequency Provider Last Rate Last Admin   0.9 %  sodium chloride infusion  100 mL Intravenous PRN Donato Heinz, MD       0.9 %  sodium chloride infusion  100 mL Intravenous PRN Donato Heinz, MD       acetaminophen (TYLENOL) tablet 650 mg  650 mg Oral Q6H PRN Arrien, Jimmy Picket, MD   650 mg at 11/29/20 0411   Or   acetaminophen (TYLENOL) suppository 650 mg  650 mg Rectal Q6H PRN Arrien, Jimmy Picket, MD       albuterol (PROVENTIL) (2.5 MG/3ML) 0.083% nebulizer solution 2.5 mg  2.5 mg Nebulization Q6H PRN Arrien, Jimmy Picket, MD       alteplase (CATHFLO ACTIVASE) injection 2 mg  2 mg Intracatheter Once PRN Donato Heinz, MD       apixaban Arne [place]) tablet 5 mg  5 mg Oral BID Arrien, Jimmy Picket, MD   5 mg at 11/29/20 7628   calcitRIOL (ROCALTROL) capsule 0.5 mcg  0.5 mcg Oral Q M,W,F Rosita Fire, MD   0.5 mcg at 11/29/20 1215   calcium acetate (PHOSLO) capsule 1,334 mg  1,334 mg Oral TID with meals Arrien, Jimmy Picket, MD   1,334 mg at 11/29/20 0804   Chlorhexidine Gluconate Cloth 2 % PADS 6 each  6 each Topical Q0600 Donato Heinz, MD   6 each at 11/29/20 0804   Chlorhexidine Gluconate Cloth 2 % PADS 6 each  6 each Topical Q0600 Rosita Fire, MD   6 each at 11/29/20 1019   fluticasone (FLONASE) 50 MCG/ACT nasal spray 2 spray  2 spray Each Nare Daily PRN Arrien, Jimmy Picket, MD       heparin injection 1,000 Units  1,000 Units Dialysis PRN Donato Heinz, MD       heparin injection 2,400  Units  20 Units/kg Dialysis PRN Rosita Fire, MD       heparin sodium (porcine) 1000 UNIT/ML injection            lidocaine (PF) (XYLOCAINE) 1 % injection 5 mL  5 mL Intradermal PRN Donato Heinz, MD       lidocaine-prilocaine (EMLA) cream 1 application  1 application Topical PRN Donato Heinz, MD       loratadine (CLARITIN) tablet 10 mg  10 mg Oral q1800 Tawni Millers, MD   10 mg at 11/28/20 1714   MEDLINE mouth rinse  15 mL Mouth Rinse BID Arrien, Jimmy Picket, MD   15 mL at 11/29/20 1020   mupirocin ointment (BACTROBAN) 2 %   Nasal BID Tawni Millers, MD   Given at 11/29/20 0949   ondansetron (ZOFRAN) tablet 4 mg  4 mg Oral Q6H PRN Arrien, Jimmy Picket, MD       Or   ondansetron [facility]) injection 4 mg  4 mg Intravenous Q6H PRN Arrien, Jimmy Picket, MD   4 mg at 11/28/20 0007   pantoprazole (PROTONIX) EC tablet 40 mg  40 mg Oral Daily Arrien, Jimmy Picket, MD   40 mg at 11/29/20 0947   pentafluoroprop-tetrafluoroeth (GEBAUERS) aerosol 1 application  1 application Topical PRN Donato Heinz, MD       vancomycin (VANCOCIN) IVPB 1000 mg/200 mL premix  1,000 mg Intravenous Q M,W,F-HD Laren Everts, [facility]         Discharge Medications: Please see discharge summary for a list of discharge medications.  Relevant Imaging Results:  Relevant Lab Results:   Additional Information SSN: [id number]. Pfizer vaccines 05/08/19, 06/08/19. Dialysis MWF at [facility].  Salome Arnt, LCSW

## 2020-11-29 NOTE — Progress Notes (Signed)
[place] removed and no ptx on CXR.  Curlene Labrum, MD [facility] [address] [place], [place] [address] [phone] (office)

## 2020-11-29 NOTE — Plan of Care (Addendum)
  Problem: Acute Rehab PT Goals(only PT should resolve) Goal: Pt will Roll Supine to Side Outcome: Progressing Flowsheets (Taken 11/29/2020 1219) Pt will Roll Supine to Side: with min assist Goal: Pt Will Go Supine/Side To Sit Outcome: Progressing Flowsheets (Taken 11/29/2020 1219) Pt will go Supine/Side to Sit: with minimal assist Goal: Pt Will Go Sit To Supine/Side Outcome: Progressing Flowsheets (Taken 11/29/2020 1219) Pt will go Sit to Supine/Side: with minimal assist Goal: Patient Will Perform Sitting Balance Outcome: Progressing Flowsheets (Taken 11/29/2020 1219) Patient will perform sitting balance: with min guard assist Goal: Patient Will Transfer Sit To/From Stand Outcome: Progressing Flowsheets (Taken 11/29/2020 1219) Patient will transfer sit to/from stand: with minimal assist Goal: Pt Will Transfer Bed To Chair/Chair To Bed Outcome: Progressing Flowsheets (Taken 11/29/2020 1219) Pt will Transfer Bed to Chair/Chair to Bed: with min assist Goal: Pt Will Perform Standing Balance Or Pre-Gait Outcome: Progressing Flowsheets (Taken 11/29/2020 1219) Pt will perform standing balance or pre-gait: with min guard assist Goal: Pt Will Ambulate Outcome: Progressing Flowsheets (Taken 11/29/2020 1219) Pt will Ambulate:  with rolling walker  with min guard assist  10 feet Goal: Pt Will Go Up/Down Stairs Outcome: Progressing Flowsheets (Taken 11/29/2020 1219) Pt will Go Up / Down Stairs:  1-2 stairs  with moderate assist  Cassie Jones, SPT   During this treatment session, the therapist was present, participating in and directing the treatment.  12:30 PM, 11/29/20 Lonell Grandchild, MPT Physical Therapist with [facility] [phone] office [phone] mobile phone

## 2020-11-29 NOTE — Procedures (Signed)
   HEMODIALYSIS TREATMENT NOTE:  MRSA bacteremia noted as well as plans to remove [facility] after HD today.  Attempted first use of AVF --cannulated with 17g needles without difficulty and both lines aspirated and flushed with the expected degree of resistance given the needle gauge. High venous pressures noted (100 mmHg at Qb 150); AVF not yet able to tolerate high flows.  Catheter was accessed for return blood flow.  Qb 300.  He is complaining of right elbow pain "from where I fell on Monday" level 2 with inactivity and up to 6-7 with movement.  Heat compress was applied with some relief.  "I think I need an x-ray." --Dr. Carles Collet was notified.  4 hour heparin-free treatment completed.  Hemodynamically stable with goal challenge.  Net UF 3.5 L    Dr. Constance Haw will remove [facility] after HD is complete.  Regarding next treatment--- arterial AVF needle placement is not an issue but pressures are too high with blood return. Also had prolonged bleeding after needle removal.  Hemostasis was achieved in 20 minutes.  He will likely still need a new tunneled catheter given high venous pressures / inability to tolerate BFR -- discussed with tx team.  PLAN:  Line holiday.  Attempt cannulation for HD on Friday (17g ven needle proximally as possible).  If unable to use AVF, Dr. Constance Haw will place temporary cath on Friday and [facility] Monday afternoon next week.   [place], RN

## 2020-11-29 NOTE — Progress Notes (Signed)
Pharmacy Antibiotic Note  Travis Blair is a 70 y.o. male admitted on 11/27/2020 for heart failure, now with bacteremia, GPC growing in blood cx, awaiting identification.  Pharmacy has been consulted for vancomycin dosing.  Plan: Vancomycin 2000mg  x1 then 1000mg  IV each HD.  Goal pre-HD level 15-25 mcg/mL.  Height: 6\' 2"  (188 cm) Weight: 118.8 kg (261 lb 14.5 oz) IBW/kg (Calculated) : 82.2  Temp (24hrs), Avg:99.8 F (37.7 C), Min:97.9 F (36.6 C), Max:100.8 F (38.2 C)  Recent Labs  Lab 11/27/20 1008 11/27/20 1711 11/28/20 0449  WBC  --  15.8* 14.7*  CREATININE 6.38* 6.75* 4.38*    Estimated Creatinine Clearance: 21.8 mL/min (A) (by C-G formula based on SCr of 4.38 mg/dL (H)).    Allergies  Allergen Reactions   Latex Hives and Rash   Lipitor [Atorvastatin]     Leg aches      Thank you for allowing pharmacy to be a part of this patient's care.  Wynona Neat, PharmD, BCPS  11/29/2020 12:26 AM

## 2020-11-29 NOTE — Progress Notes (Signed)
[facility] NEPHROLOGY PROGRESS NOTE  Assessment/ Plan: Pt is a 70 y.o. yo male  with a PMH signiciant for CAD s/p CABG, DM type 2, HTN, HLD, h/o DVT/PE, pulmonary HTN, OSA on CPAP, chronic CHF, and ESRD on HD MWF at [facility] who presented to [facility] ED after falling at home.  Dialysis Orders: Center: [facility]  on MWF . EDW 120.5kg HD Bath 3K/2.5Ca  Time 4 hours Heparin 2000 units bolus. Access RIJ TDC (maturing LUE AVF) BFR 400 DFR 800    Calcitriol 0.5 mcg po/HD Venofer  100 mg TIW, Micera 100 mcg IVP every 2 weeks   #Acute hypoxic respiratory failure presumed due to volume overload/acute diastolic CHF: Mild improvement with ultrafiltration.  Challenge dry weight with dialysis.  #Gram-positive cocci bacteremia: Identified as staph aureus.  Patient is febrile as well.  We will plan to discontinue HD catheter after dialysis today.  Already on vancomycin.  Discussed with the team.  # ESRD MWF: Plan for regular dialysis today with attempt to ultrafiltrate.  The AV fistula looks matured to me.  I will have dialysis nurse to assess today.  Hopefully we can avoid placing another tunnel catheter after clearance of infection.    # Anemia: Hemoglobin at goal.  # Secondary hyperparathyroidism: Monitor calcium phosphorus level.  Continue calcitriol.  # HTN/volume: Monitor blood pressure.  Challenge ultrafiltration to maintain euvolemia.  Subjective: Seen and examined at bedside.  Patient reports his breathing is slightly better.  Febrile to 102 this morning.  No nausea, vomiting, chest pain. Objective Vital signs in last 24 hours: Vitals:   11/29/20 0000 11/29/20 0100 11/29/20 0410 11/29/20 0808  BP: 133/60  140/63   Pulse: 86  94 78  Resp: (!) 23  (!) 24 16  Temp: 99.8 F (37.7 C)  (!) 102 F (38.9 C) 98.2 F (36.8 C)  TempSrc: Oral  Oral Oral  SpO2: 93% 90% 97% 96%  Weight:      Height:       Weight change:   Intake/Output Summary (Last 24 hours) at 11/29/2020 0840 Last data filed  at 11/29/2020 0400 Gross per 24 hour  Intake 738.06 ml  Output --  Net 738.06 ml       Labs: Basic Metabolic Panel: Recent Labs  Lab 11/27/20 1711 11/28/20 0449 11/29/20 0440  NA 135 135 131*  K 3.8 3.9 4.1  CL 100 99 95*  CO2 24 26 26   GLUCOSE 178* 126* 138*  BUN 74* 41* 60*  CREATININE 6.75* 4.38* 6.10*  CALCIUM 8.0* 8.1* 7.9*  PHOS 4.4  --   --    Liver Function Tests: Recent Labs  Lab 11/27/20 1711  ALBUMIN 2.6*   No results for input(s): LIPASE, AMYLASE in the last 168 hours. No results for input(s): AMMONIA in the last 168 hours. CBC: Recent Labs  Lab 11/27/20 1711 11/28/20 0449  WBC 15.8* 14.7*  HGB 9.1* 10.0*  HCT 30.1* 32.4*  MCV 90.4 90.3  PLT 156 185   Cardiac Enzymes: No results for input(s): CKTOTAL, CKMB, CKMBINDEX, TROPONINI in the last 168 hours. CBG: Recent Labs  Lab 11/27/20 2216  GLUCAP 128*    Iron Studies: No results for input(s): IRON, TIBC, TRANSFERRIN, FERRITIN in the last 72 hours. Studies/Results: DG Chest Port 1 View  Result Date: 11/27/2020 CLINICAL DATA:  Fall.  Chest and right foot pain. EXAM: PORTABLE CHEST 1 VIEW COMPARISON:  09/01/2020 FINDINGS: Unchanged mild cardiomegaly. Unchanged mild pulmonary vascular congestion. Small bilateral pleural effusions are  not significantly changed since prior examination. Interval placement of dialysis catheter. Median sternotomy changes again noted. IMPRESSION: No acute abnormality of the chest. Unchanged findings of mild CHF/fluid volume overload. Electronically Signed   By: Miachel Roux M.D.   On: 11/27/2020 11:24   DG Foot 2 Views Right  Result Date: 11/27/2020 CLINICAL DATA:  wound to lateral fore foot EXAM: RIGHT FOOT - 2 VIEW COMPARISON:  Radiograph 08/18/2020 FINDINGS: There is no evidence of acute fracture or dislocation. Unchanged bulky ossification along the base of the fifth metatarsal, possibly from prior trauma. There is a lateral forefoot soft tissue defect. There is no  frank bony destruction radiographically. Mild midfoot and forefoot degenerative changes. Plantar and dorsal calcaneal spurring. Vascular calcifications. IMPRESSION: Lateral forefoot soft tissue defect. No frank bony destruction radiographically to suggest osteomyelitis. MRI would be more sensitive. Electronically Signed   By: Maurine Simmering M.D.   On: 11/27/2020 11:32   ECHOCARDIOGRAM LIMITED  Result Date: 11/28/2020    ECHOCARDIOGRAM LIMITED REPORT   Patient Name:   CAGE GUPTON [facility] Date of Exam: 11/28/2020 Medical Rec #:  [id number]           Height:       74.0 in Accession #:    [id number]          Weight:       261.9 lb Date of Birth:  18-May-1950           BSA:          2.437 m Patient Age:    79 years            BP:           124/56 mmHg Patient Gender: M                   HR:           72 bpm. Exam Location:  [facility] Procedure: Limited Echo, Color Doppler and Cardiac Doppler Indications:    Dyspnea R06.00  History:        Patient has prior history of Echocardiogram examinations, most                 recent 06/06/2020. CAD, Prior CABG, Arrythmias:Atrial                 Fibrillation, Signs/Symptoms:Shortness of Breath; Risk                 Factors:Hypertension, Diabetes and Dyslipidemia.  Sonographer:    Alvino Chapel RCS Referring Phys: [id number] [place]  1. Left ventricular ejection fraction, by estimation, is 45 to 50%. The left ventricle has mildly decreased function. The left ventricle demonstrates regional wall motion abnormalities (see scoring diagram/findings for description). There is severe asymmetric left ventricular hypertrophy of the basal and septal segments. Left ventricular diastolic parameters are indeterminate. There is the interventricular septum is flattened in systole and diastole, consistent with right ventricular pressure and volume overload.  2. Right ventricular systolic function is low normal. The right ventricular size is mildly enlarged. There is  moderately elevated pulmonary artery systolic pressure. The estimated right ventricular systolic pressure is 83.6 mmHg.  3. Left atrial size was mildly dilated.  4. Right atrial size was mildly dilated.  5. Left pleural effusion evident. A small pericardial effusion is present. The pericardial effusion is posterior to the left ventricle.  6. The mitral valve is abnormal. Mild mitral valve regurgitation.  7. The aortic valve is tricuspid. There  is moderate calcification of the aortic valve. Aortic valve regurgitation is not visualized.  8. The inferior vena cava is dilated in size with <50% respiratory variability, suggesting right atrial pressure of 15 mmHg. Comparison(s): Prior images reviewed side by side. Reduced LVEF and wall motion abnormalities are not new based on my review of the prior study. FINDINGS  Left Ventricle: Left ventricular ejection fraction, by estimation, is 45 to 50%. The left ventricle has mildly decreased function. The left ventricle demonstrates regional wall motion abnormalities. Definity contrast agent was given IV to delineate the left ventricular endocardial borders. There is severe asymmetric left ventricular hypertrophy of the basal and septal segments. The interventricular septum is flattened in systole and diastole, consistent with right ventricular pressure and volume overload. Left ventricular diastolic parameters are indeterminate. Left ventricular diastolic function could not be evaluated due to atrial fibrillation.  LV Wall Scoring: The basal inferior segment is akinetic. The posterior wall, mid inferoseptal segment, mid inferior segment, and basal inferoseptal segment are hypokinetic. The entire anterior wall, antero-lateral wall, entire anterior septum, and entire apex are normal. Right Ventricle: The right ventricular size is mildly enlarged. No increase in right ventricular wall thickness. Right ventricular systolic function is low normal. There is moderately elevated  pulmonary artery systolic pressure. The tricuspid regurgitant  velocity is 3.12 m/s, and with an assumed right atrial pressure of 15 mmHg, the estimated right ventricular systolic pressure is 98.9 mmHg. Left Atrium: Left atrial size was mildly dilated. Right Atrium: Right atrial size was mildly dilated. Pericardium: Left pleural effusion evident. A small pericardial effusion is present. The pericardial effusion is posterior to the left ventricle. Mitral Valve: The mitral valve is abnormal. There is mild thickening of the mitral valve leaflet(s). There is mild calcification of the mitral valve leaflet(s). Mild mitral valve regurgitation. Tricuspid Valve: The tricuspid valve is grossly normal. Tricuspid valve regurgitation is mild. Aortic Valve: The aortic valve is tricuspid. There is moderate calcification of the aortic valve. There is mild to moderate aortic valve annular calcification. Aortic valve regurgitation is not visualized. Aorta: The aortic root is normal in size and structure. Venous: The inferior vena cava is dilated in size with less than 50% respiratory variability, suggesting right atrial pressure of 15 mmHg. IAS/Shunts: No atrial level shunt detected by color flow Doppler. LEFT VENTRICLE PLAX 2D LVIDd:         5.50 cm LVIDs:         4.30 cm LV PW:         1.30 cm LV IVS:        1.80 cm LVOT diam:     2.30 cm LVOT Area:     4.15 cm  RIGHT VENTRICLE TAPSE (M-mode): 1.7 cm LEFT ATRIUM              Index        RIGHT ATRIUM           Index LA diam:        4.20 cm  1.72 cm/m   RA Area:     27.30 cm LA Vol (A2C):   102.0 ml 41.85 ml/m  RA Volume:   96.50 ml  39.59 ml/m LA Vol (A4C):   74.5 ml  30.57 ml/m LA Biplane Vol: 91.8 ml  37.66 ml/m   AORTA Ao Root diam: 3.80 cm MITRAL VALVE                TRICUSPID VALVE MV Area (PHT): 5.02 cm  TR Peak grad:   38.9 mmHg MV Decel Time: 151 msec     TR Vmax:        312.00 cm/s MV E velocity: 143.00 cm/s                             SHUNTS                              Systemic Diam: 2.30 cm Rozann Lesches MD Electronically signed by Rozann Lesches MD Signature Date/Time: 11/28/2020/1:03:27 PM    Final     Medications: Infusions:  sodium chloride     sodium chloride     vancomycin      Scheduled Medications:  apixaban  5 mg Oral BID   calcium acetate  1,334 mg Oral TID with meals   Chlorhexidine Gluconate Cloth  6 each Topical Q0600   ferrous sulfate  325 mg Oral Q breakfast   loratadine  10 mg Oral q1800   mouth rinse  15 mL Mouth Rinse BID   mupirocin ointment   Nasal BID   pantoprazole  40 mg Oral Daily    have reviewed scheduled and prn medications.  Physical Exam: General:NAD, comfortable Heart:RRR, s1s2 nl Lungs: Bibasal rhonchi, no increased work of breathing Abdomen:soft, Non-tender, non-distended Extremities: Lower extremity pitting edema+ Dialysis Access: Right IJ TDC site looks clean.  Left upper extremity AV fistula has good thrill and bruit.  Cheri Ayotte Prasad Anacristina Steffek 11/29/2020,8:40 AM  LOS: 2 days

## 2020-11-29 NOTE — Progress Notes (Addendum)
ANTICOAGULATION CONSULT NOTE - Initial Consult  Pharmacy Consult for Heparin Indication: hx DVT/afib  Allergies  Allergen Reactions   Latex Hives and Rash   Lipitor [Atorvastatin]     Leg aches     Patient Measurements: Height: 6\' 2"  (188 cm) Weight: 117.9 kg (259 lb 14.8 oz) IBW/kg (Calculated) : 82.2 HEPARIN DW (KG): 107.6   Vital Signs: Temp: 98.2 F (36.8 C) (10/19 1045) Temp Source: Oral (10/19 1045) BP: 117/68 (10/19 1445) Pulse Rate: 97 (10/19 1445)  Labs: Recent Labs    11/27/20 1008 11/27/20 1008 11/27/20 1150 11/27/20 1711 11/28/20 0449 11/29/20 0440  HGB  --    < >  --  9.1* 10.0* 9.4*  HCT  --   --   --  30.1* 32.4* 31.9*  PLT  --   --   --  156 185 162  APTT 36  --   --   --   --   --   CREATININE 6.38*  --   --  6.75* 4.38* 6.10*  TROPONINIHS 312*  --  485*  --   --   --    < > = values in this interval not displayed.     Estimated Creatinine Clearance: 15.6 mL/min (A) (by C-G formula based on SCr of 6.1 mg/dL (H)).   Medical History: Past Medical History:  Diagnosis Date   Anemia associated with chronic renal failure basically   Asthma    as a child   CHF (congestive heart failure) ([place])    Chronic kidney disease    Sees Dr [name]   Constipation    Coronary artery disease    COVID-19    Depression    Diabetes mellitus without complication ([place])    DVT (deep venous thrombosis) ([place])    History of blood clots    Hyperlipidemia    Hypertension    Leg pain    ABIs/LE Arterial US 03/2019: ABIs unreliable; +calcification, no evidence of stenosis   Myocardial infarction [facility])    Obesity    Pneumonia 2018   Pulmonary embolism ([facility])    Sleep apnea    CPAP   SOB (shortness of breath)    Swelling of both lower extremities     Medications:  See med rec  Assessment: 70 y.o. male with significant medical history as below, including ESRD on HD MWF. Patient with history of DVT/afib- restarted on Eliquis this admission but now holding in  preparation for new catheter insertion.  Goal of Therapy:  APTT 66-102 sec Heparin level 0.3-0.7 units/ml Monitor platelets by anticoagulation protocol: Yes   Plan:  Stop Eliquis- start heparin when next dose would be due. Start heparin infusion at 1400 units/hr @2200 . Check aPTT/HL in 8 hours and daily while on heparin. Continue to monitor H&H and platelets.  Margot Ables, PharmD Clinical Pharmacist 11/29/2020 3:54 PM

## 2020-11-29 NOTE — Progress Notes (Signed)
PROGRESS NOTE  Travis Blair EXB:[id number] DOB: 1950/07/06 DOA: 11/27/2020 PCP: [name], MD  Brief History:   70 y.o. male with medical history significant of CHF, ERSD on HD (M-W-F), CAD, DVT/PE, and T2DM, CABG with post operative atrial fibrillation, pulmonary hypertension who presented with dyspnea.  Patient reported having cough for about 2 weeks, persistent and worsening.  Tried several antitussive agents at home without improvement of symptoms.  He went to dialysis on Friday, oct 14. He e was noted to be dyspneic by his wife, moderate in intensity, associated with cough.  He has had a chronic cough for at least one year, worse over last week prior to [place].  He took some robitussin and codeine from an old prescription on 11/26/20.  On Monday, he was more unsteady and fell in the BR.  He had subjective f/c at home since 11/24/20.  He denied n/v/d, abd pain.    His main symptom seems to be increased work of breathing/dyspnea, moderate to severe in intensity, worse with exertion, associated with cough, no chest pain, no improving factors. Last night not able to sleep due to persistent cough, dry.  He continues to make urine 3 times daily.   In June 2022 he decided to stop apixaban due to weakness, he was placed on clopidogrel. Last year he had COVID-pneumonia and pulm embolism. About 3 months ago he was started on hemodialysis, he has a fistula which is not mature yet.  He uses a tunneled dialysis catheter for dialysis.  Since admission, nephrology was consulted to assist.  He was dialyzed on 10/17 and 10/19.  He was found to have MRSA bacteremia.  General surgery was consulted to remove his Permacath.  He was started on IV vancomycin 11/29/20.  Assessment/Plan: Sepsis  -present on admission -had fever and leukocytosis -due to MRSA bacteremia  MRSA bacteremia -remove tunneled HD catheter 10/19 after HD -repeat blood cultures after tunneled catheter is  removed -temporary vascath planned 10/21  Acute respiratory failure with hypoxia -due to pulmonary edema -presented with oxygen saturation 87% on RA and tachypnea -stable on 2L -wean off oxygen for saturation >92%  Acute on chronic diastolic CHF -02/12/70 Echo 60-65%, no WMA; RV systolic function preserved.  RSVP 57.3.  No significant valvular disease. -fluid removal on HD  ESRD -appreciate renal consult -HD on 10/17, 10/19  History DVT/PE -hold apixaban temporarily in preparation for new catheter insertion  Chronic Afib -AC as discussed above -start IV heparin  Uncontrolled Diabetes mellitus type 2 with hyperglycemia -08/18/20 A1C--8.0 -CBGs controlled so far -novolog sliding scale -repeat A1C  Right Elbow pain -due to recent fall on 10/17 -MR right elbow      Status is: Inpatient  Remains inpatient appropriate because: severity of illness        Family Communication:   spouse updated at bedside 10/19  Consultants:  renal, general surgery  Code Status:  FULL / DNR  DVT Prophylaxis:  IV Heparin    Procedures: As Listed in Progress Note Above  Antibiotics: Vanco 10/19>>      Subjective: Pt complains of right elbow pain--improving.  Denies cp, n/v/d.  Sob is improving.  Denies hemoptysis.  Cough is improving.  No abd pain, headache, neck pain  Objective: Vitals:   11/29/20 1400 11/29/20 1415 11/29/20 1430 11/29/20 1445  BP: (!) 104/58 119/61 124/65 117/68  Pulse: 97 82 84 97  Resp: 20 (!) 22 (!) 25 (!) 30  Temp:      TempSrc:      SpO2:      Weight:      Height:        Intake/Output Summary (Last 24 hours) at 11/29/2020 1520 Last data filed at 11/29/2020 0400 Gross per 24 hour  Intake 738.06 ml  Output --  Net 738.06 ml   Weight change:  Exam:  General:  Pt is alert, follows commands appropriately, not in acute distress HEENT: No icterus, No thrush, No neck mass, [place]/AT Cardiovascular: IRRR, S1/S2, no rubs, no  gallops Respiratory: bibasilar crackles. No wheeze Abdomen: Soft/+BS, non tender, non distended, no guarding Extremities: 1 +LE edema, No lymphangitis, No petechiae, No rashes, no synovitis   Data Reviewed: I have personally reviewed following labs and imaging studies Basic Metabolic Panel: Recent Labs  Lab 11/27/20 1008 11/27/20 1711 11/28/20 0449 11/29/20 0440  NA 136 135 135 131*  K 4.4 3.8 3.9 4.1  CL 100 100 99 95*  CO2 22 24 26 26   GLUCOSE 178* 178* 126* 138*  BUN 69* 74* 41* 60*  CREATININE 6.38* 6.75* 4.38* 6.10*  CALCIUM 8.4* 8.0* 8.1* 7.9*  PHOS  --  4.4  --   --    Liver Function Tests: Recent Labs  Lab 11/27/20 1711  ALBUMIN 2.6*   No results for input(s): LIPASE, AMYLASE in the last 168 hours. No results for input(s): AMMONIA in the last 168 hours. Coagulation Profile: No results for input(s): INR, PROTIME in the last 168 hours. CBC: Recent Labs  Lab 11/27/20 1711 11/28/20 0449 11/29/20 0440  WBC 15.8* 14.7* 15.0*  HGB 9.1* 10.0* 9.4*  HCT 30.1* 32.4* 31.9*  MCV 90.4 90.3 92.7  PLT 156 185 162   Cardiac Enzymes: No results for input(s): CKTOTAL, CKMB, CKMBINDEX, TROPONINI in the last 168 hours. BNP: Invalid input(s): POCBNP CBG: Recent Labs  Lab 11/27/20 2216  GLUCAP 128*   HbA1C: No results for input(s): HGBA1C in the last 72 hours. Urine analysis:    Component Value Date/Time   COLORURINE YELLOW 03/10/2017 1553   APPEARANCEUR HAZY (A) 03/10/2017 1553   LABSPEC 1.013 03/10/2017 1553   PHURINE 5.0 03/10/2017 1553   GLUCOSEU NEGATIVE 03/10/2017 1553   HGBUR NEGATIVE 03/10/2017 1553   BILIRUBINUR NEGATIVE 03/10/2017 1553   KETONESUR NEGATIVE 03/10/2017 1553   PROTEINUR 30 (A) 03/10/2017 1553   NITRITE NEGATIVE 03/10/2017 1553   LEUKOCYTESUR NEGATIVE 03/10/2017 1553   Sepsis Labs: @LABRCNTIP (procalcitonin:4,lacticidven:4) ) Recent Results (from the past 240 hour(s))  Resp Panel by RT-PCR (Flu A&B, Covid) Nasopharyngeal Swab      Status: None   Collection Time: 11/27/20 10:09 AM   Specimen: Nasopharyngeal Swab; Nasopharyngeal(NP) swabs in vial transport medium  Result Value Ref Range Status   SARS Coronavirus 2 by RT PCR NEGATIVE NEGATIVE Final    Comment: (NOTE) SARS-CoV-2 target nucleic acids are NOT DETECTED.  The SARS-CoV-2 RNA is generally detectable in upper respiratory specimens during the acute phase of infection. The lowest concentration of SARS-CoV-2 viral copies this assay can detect is 138 copies/mL. A negative result does not preclude SARS-Cov-2 infection and should not be used as the sole basis for treatment or other patient management decisions. A negative result may occur with  improper specimen collection/handling, submission of specimen other than nasopharyngeal swab, presence of viral mutation(s) within the areas targeted by this assay, and inadequate number of viral copies(<138 copies/mL). A negative result must be combined with clinical observations, patient history, and epidemiological information. The expected result  is Negative.  Fact Sheet for Patients:  EntrepreneurPulse.com.au  Fact Sheet for Healthcare Providers:  IncredibleEmployment.be  This test is no t yet approved or cleared by the [place] FDA and  has been authorized for detection and/or diagnosis of SARS-CoV-2 by FDA under an Emergency Use Authorization (EUA). This EUA will remain  in effect (meaning this test can be used) for the duration of the COVID-19 declaration under Section 564(b)(1) of the Act, 21 U.S.C.section 360bbb-3(b)(1), unless the authorization is terminated  or revoked sooner.       Influenza A by PCR NEGATIVE NEGATIVE Final   Influenza B by PCR NEGATIVE NEGATIVE Final    Comment: (NOTE) The Xpert Xpress SARS-CoV-2/FLU/RSV plus assay is intended as an aid in the diagnosis of influenza from Nasopharyngeal swab specimens and should not be used as a sole basis  for treatment. Nasal washings and aspirates are unacceptable for Xpert Xpress SARS-CoV-2/FLU/RSV testing.  Fact Sheet for Patients: EntrepreneurPulse.com.au  Fact Sheet for Healthcare Providers: IncredibleEmployment.be  This test is not yet approved or cleared by the [place] FDA and has been authorized for detection and/or diagnosis of SARS-CoV-2 by FDA under an Emergency Use Authorization (EUA). This EUA will remain in effect (meaning this test can be used) for the duration of the COVID-19 declaration under Section 564(b)(1) of the Act, 21 U.S.C. section 360bbb-3(b)(1), unless the authorization is terminated or revoked.  Performed at [facility], [address], [place], [place] [address]   MRSA Next Gen by PCR, Nasal     Status: Abnormal   Collection Time: 11/27/20  4:10 PM   Specimen: Nasal Mucosa; Nasal Swab  Result Value Ref Range Status   MRSA by PCR Next Gen DETECTED (A) NOT DETECTED Final    Comment: RESULT CALLED TO, READ BACK BY AND VERIFIED WITH: HEATHER TETREAULT,1951,11/27/2020,SELF S. (NOTE) The GeneXpert MRSA Assay (FDA approved for NASAL specimens only), is one component of a comprehensive MRSA colonization surveillance program. It is not intended to diagnose MRSA infection nor to guide or monitor treatment for MRSA infections. Test performance is not FDA approved in patients less than 80 years old. Performed at [facility], [address] [place], [place] [address]   Culture, blood (Routine X 2) w Reflex to ID Panel     Status: None (Preliminary result)   Collection Time: 11/28/20  9:34 AM   Specimen: BLOOD RIGHT HAND  Result Value Ref Range Status   Specimen Description   Final    BLOOD RIGHT HAND Performed at [facility], [address] [place], [place] [address]    Special Requests   Final    Immunocompromised Blood Culture adequate volume BOTTLES DRAWN AEROBIC AND ANAEROBIC Performed at [facility],  [address] [place], [place] [address]    Culture  Setup Time   Final    GRAM POSITIVE COCCI ANAEROBIC BOTTLE ONLY Gram Stain Report Called to,Read Back By and Verified WithLynwood Dawley, 2216, 11/28/2020,SELF S aerobic bottle previously called 10.18.22 2353 matthews, b  Performed at [facility], [address] [place],  [address]    Culture GRAM POSITIVE COCCI  Final   Report Status PENDING  Incomplete  Culture, blood (Routine X 2) w Reflex to ID Panel     Status: None (Preliminary result)   Collection Time: 11/28/20  9:34 AM   Specimen: BLOOD RIGHT FOREARM  Result Value Ref Range Status   Specimen Description   Final    BLOOD RIGHT FOREARM Performed at [facility], [address], [place], [place] [address]    Special Requests   Final    Immunocompromised Blood Culture adequate volume BOTTLES DRAWN AEROBIC AND ANAEROBIC Performed at [facility], [address], [place], [place] [address]    Culture  Setup Time   Final    GRAM POSITIVE COCCI IN BOTH AEROBIC AND ANAEROBIC BOTTLES Gram Stain Report Called to,Read Back By and Verified With: T.ALLEN,2236,11/28/2020,SELF S aerobic bottle [facility] previously called matthews, b 10.18.22 2355 CRITICAL RESULT CALLED TO, READ BACK BY AND VERIFIED WITH: T ALLEN,RN@0209  11/29/20 [place] Performed at [facility], [place] [address] [place], [place] [address]    Culture GRAM POSITIVE COCCI  Final   Report Status PENDING  Incomplete  Blood Culture ID Panel (Reflexed)     Status: Abnormal   Collection Time: 11/28/20  9:34 AM  Result Value Ref Range Status   Enterococcus faecalis NOT DETECTED NOT DETECTED Final   Enterococcus Faecium NOT DETECTED NOT DETECTED Final   Listeria monocytogenes NOT DETECTED NOT DETECTED Final   Staphylococcus species DETECTED (A) NOT DETECTED Final    Comment: CRITICAL RESULT CALLED TO, READ BACK BY AND VERIFIED WITH: T ALLEN,RN@0209  11/29/20 [place]    Staphylococcus aureus (BCID) DETECTED (A) NOT DETECTED Final     Comment: Methicillin (oxacillin)-resistant Staphylococcus aureus (MRSA). MRSA is predictably resistant to beta-lactam antibiotics (except ceftaroline). Preferred therapy is vancomycin unless clinically contraindicated. Patient requires contact precautions if  hospitalized. CRITICAL RESULT CALLED TO, READ BACK BY AND VERIFIED WITH: T ALLEN,RN@0209  11/29/20 [place]    Staphylococcus epidermidis NOT DETECTED NOT DETECTED Final   Staphylococcus lugdunensis NOT DETECTED NOT DETECTED Final   Streptococcus species DETECTED (A) NOT DETECTED Final    Comment: Not Enterococcus species, Streptococcus agalactiae, Streptococcus pyogenes, or Streptococcus pneumoniae. CRITICAL RESULT CALLED TO, READ BACK BY AND VERIFIED WITH: T ALLEN,RN@ 0209 11/29/20 [place]    Streptococcus agalactiae NOT DETECTED NOT DETECTED Final   Streptococcus pneumoniae NOT DETECTED NOT DETECTED Final   Streptococcus pyogenes NOT DETECTED NOT DETECTED Final   A.calcoaceticus-baumannii NOT DETECTED NOT DETECTED Final   Bacteroides fragilis NOT DETECTED NOT DETECTED Final   Enterobacterales NOT DETECTED NOT DETECTED Final   Enterobacter cloacae complex NOT DETECTED NOT DETECTED Final   Escherichia coli NOT DETECTED NOT DETECTED Final   Klebsiella aerogenes NOT DETECTED NOT DETECTED Final   Klebsiella oxytoca NOT DETECTED NOT DETECTED Final   Klebsiella pneumoniae NOT DETECTED NOT DETECTED Final   Proteus species NOT DETECTED NOT DETECTED Final   Salmonella species NOT DETECTED NOT DETECTED Final   Serratia marcescens NOT DETECTED NOT DETECTED Final   Haemophilus influenzae NOT DETECTED NOT DETECTED Final   Neisseria meningitidis NOT DETECTED NOT DETECTED Final   Pseudomonas aeruginosa NOT DETECTED NOT DETECTED Final   Stenotrophomonas maltophilia NOT DETECTED NOT DETECTED Final   Candida albicans NOT DETECTED NOT DETECTED Final   Candida auris NOT DETECTED NOT DETECTED Final   Candida glabrata NOT DETECTED NOT DETECTED Final   Candida  krusei NOT DETECTED NOT DETECTED Final   Candida parapsilosis NOT DETECTED NOT DETECTED Final   Candida tropicalis NOT DETECTED NOT DETECTED Final   Cryptococcus neoformans/gattii NOT DETECTED NOT DETECTED Final   Meth resistant mecA/C and MREJ DETECTED (A) NOT DETECTED Final    Comment: CRITICAL RESULT CALLED TO, READ BACK BY AND VERIFIED WITH: T ALLEN,RN@0209  11/29/20 [place] Performed at [facility], [address] [place], [place] [address]      Scheduled Meds:  apixaban  5  mg Oral BID   calcitRIOL  0.5 mcg Oral Q M,W,F   calcium acetate  1,334 mg Oral TID with meals   Chlorhexidine Gluconate Cloth  6 each Topical Q0600   Chlorhexidine Gluconate Cloth  6 each Topical Q0600   lidocaine (PF)  30 mL Intradermal Once   loratadine  10 mg Oral q1800   mouth rinse  15 mL Mouth Rinse BID   mupirocin ointment   Nasal BID   pantoprazole  40 mg Oral Daily   Continuous Infusions:  sodium chloride     sodium chloride     vancomycin 1,000 mg (11/29/20 1400)    Procedures/Studies: DG Chest Port 1 View  Result Date: 11/27/2020 CLINICAL DATA:  Fall.  Chest and right foot pain. EXAM: PORTABLE CHEST 1 VIEW COMPARISON:  09/01/2020 FINDINGS: Unchanged mild cardiomegaly. Unchanged mild pulmonary vascular congestion. Small bilateral pleural effusions are not significantly changed since prior examination. Interval placement of dialysis catheter. Median sternotomy changes again noted. IMPRESSION: No acute abnormality of the chest. Unchanged findings of mild CHF/fluid volume overload. Electronically Signed   By: Miachel Roux M.D.   On: 11/27/2020 11:24   DG Foot 2 Views Right  Result Date: 11/27/2020 CLINICAL DATA:  wound to lateral fore foot EXAM: RIGHT FOOT - 2 VIEW COMPARISON:  Radiograph 08/18/2020 FINDINGS: There is no evidence of acute fracture or dislocation. Unchanged bulky ossification along the base of the fifth metatarsal, possibly from prior trauma. There is a lateral forefoot soft  tissue defect. There is no frank bony destruction radiographically. Mild midfoot and forefoot degenerative changes. Plantar and dorsal calcaneal spurring. Vascular calcifications. IMPRESSION: Lateral forefoot soft tissue defect. No frank bony destruction radiographically to suggest osteomyelitis. MRI would be more sensitive. Electronically Signed   By: Maurine Simmering M.D.   On: 11/27/2020 11:32   Intravitreal Injection, Pharmacologic Agent - OS - Left Eye  Result Date: 11/01/2020 Time Out 11/01/2020. 2:39 PM. Confirmed correct patient, procedure, site, and patient consented. Anesthesia Topical anesthesia was used. Anesthetic medications included [place] 3.5%. Procedure Preparation included Ofloxacin , 10% betadine to eyelids, 5% betadine to ocular surface. A 30 gauge needle was used. Injection: 2.5 mg bevacizumab 2.5 MG/0.1ML   Route: Intravitreal, Site: Left Eye   NDC: [phone], Lot: [id number] Post-op Post injection exam found visual acuity of at least counting fingers. The patient tolerated the procedure well. There were no complications. The patient received written and verbal post procedure care education. Post injection medications were not given.   OCT, Retina - OU - Both Eyes  Result Date: 11/01/2020 Right Eye Quality was good. Scan locations included subfoveal. Central Foveal Thickness: 312. Progression has improved. Findings include vitreomacular adhesion , cystoid macular edema. Left Eye Quality was good. Scan locations included subfoveal. Central Foveal Thickness: 306. Progression has worsened. Findings include vitreomacular adhesion , cystoid macular edema. Notes Some recurrence of CSME OS still active.  At 64-month follow-up, will restart Avastin OS today CSME OD improved on intravitreal Avastin, OS postinjection also post focal laser.  With recurrence of CSME  ECHOCARDIOGRAM LIMITED  Result Date: 11/28/2020    ECHOCARDIOGRAM LIMITED REPORT   Patient Name:   Travis Blair [facility] Date of Exam:  11/28/2020 Medical Rec #:  [id number]           Height:       74.0 in Accession #:    [id number]          Weight:       261.9 lb Date of Birth:  04/30/1950           BSA:          2.437 m Patient Age:    75 years            BP:           124/56 mmHg Patient Gender: M                   HR:           72 bpm. Exam Location:  [facility] Procedure: Limited Echo, Color Doppler and Cardiac Doppler Indications:    Dyspnea R06.00  History:        Patient has prior history of Echocardiogram examinations, most                 recent 06/06/2020. CAD, Prior CABG, Arrythmias:Atrial                 Fibrillation, Signs/Symptoms:Shortness of Breath; Risk                 Factors:Hypertension, Diabetes and Dyslipidemia.  Sonographer:    Alvino Chapel RCS Referring Phys: [id number] [place]  1. Left ventricular ejection fraction, by estimation, is 45 to 50%. The left ventricle has mildly decreased function. The left ventricle demonstrates regional wall motion abnormalities (see scoring diagram/findings for description). There is severe asymmetric left ventricular hypertrophy of the basal and septal segments. Left ventricular diastolic parameters are indeterminate. There is the interventricular septum is flattened in systole and diastole, consistent with right ventricular pressure and volume overload.  2. Right ventricular systolic function is low normal. The right ventricular size is mildly enlarged. There is moderately elevated pulmonary artery systolic pressure. The estimated right ventricular systolic pressure is 52.7 mmHg.  3. Left atrial size was mildly dilated.  4. Right atrial size was mildly dilated.  5. Left pleural effusion evident. A small pericardial effusion is present. The pericardial effusion is posterior to the left ventricle.  6. The mitral valve is abnormal. Mild mitral valve regurgitation.  7. The aortic valve is tricuspid. There is moderate calcification of the aortic valve. Aortic valve  regurgitation is not visualized.  8. The inferior vena cava is dilated in size with <50% respiratory variability, suggesting right atrial pressure of 15 mmHg. Comparison(s): Prior images reviewed side by side. Reduced LVEF and wall motion abnormalities are not new based on my review of the prior study. FINDINGS  Left Ventricle: Left ventricular ejection fraction, by estimation, is 45 to 50%. The left ventricle has mildly decreased function. The left ventricle demonstrates regional wall motion abnormalities. Definity contrast agent was given IV to delineate the left ventricular endocardial borders. There is severe asymmetric left ventricular hypertrophy of the basal and septal segments. The interventricular septum is flattened in systole and diastole, consistent with right ventricular pressure and volume overload. Left ventricular diastolic parameters are indeterminate. Left ventricular diastolic function could not be evaluated due to atrial fibrillation.  LV Wall Scoring: The basal inferior segment is akinetic. The posterior wall, mid inferoseptal segment, mid inferior segment, and basal inferoseptal segment are hypokinetic. The entire anterior wall, antero-lateral wall, entire anterior septum, and entire apex are normal. Right Ventricle: The right ventricular size is mildly enlarged. No increase in right ventricular wall thickness. Right ventricular systolic function is low normal. There is moderately elevated pulmonary artery systolic pressure. The tricuspid regurgitant  velocity is 3.12 m/s, and with an assumed right atrial pressure of 15 mmHg, the estimated  right ventricular systolic pressure is 62.3 mmHg. Left Atrium: Left atrial size was mildly dilated. Right Atrium: Right atrial size was mildly dilated. Pericardium: Left pleural effusion evident. A small pericardial effusion is present. The pericardial effusion is posterior to the left ventricle. Mitral Valve: The mitral valve is abnormal. There is mild  thickening of the mitral valve leaflet(s). There is mild calcification of the mitral valve leaflet(s). Mild mitral valve regurgitation. Tricuspid Valve: The tricuspid valve is grossly normal. Tricuspid valve regurgitation is mild. Aortic Valve: The aortic valve is tricuspid. There is moderate calcification of the aortic valve. There is mild to moderate aortic valve annular calcification. Aortic valve regurgitation is not visualized. Aorta: The aortic root is normal in size and structure. Venous: The inferior vena cava is dilated in size with less than 50% respiratory variability, suggesting right atrial pressure of 15 mmHg. IAS/Shunts: No atrial level shunt detected by color flow Doppler. LEFT VENTRICLE PLAX 2D LVIDd:         5.50 cm LVIDs:         4.30 cm LV PW:         1.30 cm LV IVS:        1.80 cm LVOT diam:     2.30 cm LVOT Area:     4.15 cm  RIGHT VENTRICLE TAPSE (M-mode): 1.7 cm LEFT ATRIUM              Index        RIGHT ATRIUM           Index LA diam:        4.20 cm  1.72 cm/m   RA Area:     27.30 cm LA Vol (A2C):   102.0 ml 41.85 ml/m  RA Volume:   96.50 ml  39.59 ml/m LA Vol (A4C):   74.5 ml  30.57 ml/m LA Biplane Vol: 91.8 ml  37.66 ml/m   AORTA Ao Root diam: 3.80 cm MITRAL VALVE                TRICUSPID VALVE MV Area (PHT): 5.02 cm     TR Peak grad:   38.9 mmHg MV Decel Time: 151 msec     TR Vmax:        312.00 cm/s MV E velocity: 143.00 cm/s                             SHUNTS                             Systemic Diam: 2.30 cm Rozann Lesches MD Electronically signed by Rozann Lesches MD Signature Date/Time: 11/28/2020/1:03:27 PM    Final     Orson Eva, DO  [facility]  If 7PM-7AM, please contact night-coverage [email or web address] Password [email or web address] 11/29/2020, 3:20 PM   LOS: 2 days

## 2020-11-29 NOTE — TOC Progression Note (Signed)
Transition of Care [facility]) - Progression Note    Patient Details  Name: Travis Blair MRN: [id number] Date of Birth: 12/19/1950  Transition of Care [facility]) CM/SW Contact  Salome Arnt, [place] Phone Number: 11/29/2020, 1:10 PM  Clinical Narrative:  PT recommending SNF. LCSW discussed with pt's wife who states she has talked to pt and they are agreeable. They request [place] placement due to dialysis being in [place]. Referral sent out. LCSW also started SNF authorization- discussed with Colletta [place] at [facility]. TOC will follow up.      Expected Discharge Plan: [place] Barriers to Discharge: Continued Medical Work up  Expected Discharge Plan and Services Expected Discharge Plan: [place] In-house Referral: Clinical Social Work   Post Acute Care Choice: [place] arrangements for the past 2 months: Single Family Home                                       Social Determinants of Health (SDOH) Interventions    Readmission Risk Interventions Readmission Risk Prevention Plan 11/28/2020 09/08/2020  Transportation Screening Complete Complete  PCP or Specialist Appt within 5-7 Days - Complete  Home Care Screening - Complete  Medication Review (RN CM) - Complete  HRI or Home Care Consult Complete -  Social Work Consult for [place] Planning/Counseling Complete -  Palliative Care Screening Not Applicable -  Medication Review Press photographer) Complete -  Some recent data might be hidden

## 2020-11-29 NOTE — Evaluation (Addendum)
Physical Therapy Evaluation Patient Details Name: Travis Blair MRN: [id number] DOB: [birth year] Today's Date: 11/29/2020  History of Present Illness  Travis Blair is a 70 y.o. male with medical history significant of CHF, ERSD on HD (M-W-F), CAD, DVT/PE, and T2DM, CABG with post operative atrial fibrillation, pulmonary hypertension who presented with dyspnea.   Patient reported having cough for about 2 weeks, persistent and worsening.  Tried several antitussive agents at home without improvement of symptoms.  He went to dialysis on Friday, 3 days ago, yesterday he was noted to be dyspneic by his wife, moderate in intensity, associated with cough but no chest pain.  Last night he was noted to have poor balance, and he declined to be brought to the emergency department.  This morning he fell while in the bathroom, landed between the commode and the wall, EMS was called and patient was brought to the hospital.     His main symptom seems to be increased work of breathing/dyspnea, moderate to severe in intensity, worse with exertion, associated with cough, no chest pain, no improving factors.  Last night not able to sleep due to persistent cough, dry.   He continues to make urine 3 times daily.     In June 2022 he decided to stop apixaban due to weakness, he was placed on clopidogrel.  Last year he had COVID-pneumonia and pulm embolism.  About 3 months ago he was started on hemodialysis, he has a fistula which is not mature yet.  He uses a tunneled dialysis catheter for dialysis.   Clinical Impression  Patient in chair awake, alert, and agreeable for therapy w/ wife at bedside. Patient reports not using O2 at home. Patient's SpO2 desaturated to 83% on room air w/ scooting to edge of chair, used supplemental O2 for the rest of the session. Patient limited in performing bed mobility, transfers, and ambulation d/t R UE pain and generalized B LE weakness.  Patient limited to a few steps at bedside using  RW due to poor standing balance and generalized weakness.  Patient will benefit from continued physical therapy in hospital and recommended venue below to increase strength, balance, endurance for safe ADLs and gait.      Recommendations for follow up therapy are one component of a multi-disciplinary discharge planning process, led by the attending physician.  Recommendations may be updated based on patient status, additional functional criteria and insurance authorization.  Follow Up Recommendations SNF    Equipment Recommendations  None recommended by PT    Recommendations for Other Services       Precautions / Restrictions Precautions Precautions: Fall Precaution Comments: Reports R elbow/arm pain d/t recent fall Restrictions Weight Bearing Restrictions: No      Mobility  Bed Mobility Overal bed mobility: Needs Assistance Bed Mobility: Rolling;Sit to Supine Rolling: Mod assist;Max assist     Sit to supine: Mod assist   General bed mobility comments: slow labored movement, required assistance for lifting legs onto the bed    Transfers Overall transfer level: Needs assistance Equipment used: Rolling walker (2 wheeled) Transfers: Sit to/from [facility] Sit to Stand: Mod assist Stand pivot transfers: Mod assist       General transfer comment: Patient used RW, unable to put much weight through R UE.  Ambulation/Gait Ambulation/Gait assistance: Mod assist Gait Distance (Feet): 6 Feet Assistive device: Rolling walker (2 wheeled) Gait Pattern/deviations: Decreased step length - right;Decreased step length - left;Decreased stride length Gait velocity: Decreased   General Gait  Details: Slow labored movement, performed lateral side stepping.  Stairs            Wheelchair Mobility    Modified Rankin (Stroke Patients Only)       Balance Overall balance assessment: Needs assistance Sitting-balance support: Feet supported;Single extremity  supported Sitting balance-Leahy Scale: Fair Sitting balance - Comments: at EOB   Standing balance support: Bilateral upper extremity supported;During functional activity Standing balance-Leahy Scale: Poor Standing balance comment: poor using RW                             Pertinent Vitals/Pain Pain Assessment: Faces Faces Pain Scale: Hurts little more Pain Location: R elbow/arm Pain Descriptors / Indicators: Sore Pain Intervention(s): Limited activity within patient's tolerance;Monitored during session    Home Living Family/patient expects to be discharged to:: Private residence Living Arrangements: Spouse/significant other Available Help at Discharge: Family;Available 24 hours/day Type of Home: House Home Access: Stairs to enter Entrance Stairs-Rails: Left Entrance Stairs-Number of Steps: 3 Home Layout: One level Home Equipment: Walker - 4 wheels;Cane - single point;Adaptive equipment;Tub bench;Grab bars - toilet;Other (comment) (HOB can raise up and lower)      Prior Function Level of Independence: Independent with assistive device(s)         Comments: Wife and patient report patient is able to ambulate w/ cane, RW, or Rollator depending on the day and distance. Wife completes ADLs. Patient ambulates short household and community distances.     Hand Dominance   Dominant Hand: Right    Extremity/Trunk Assessment   Upper Extremity Assessment Upper Extremity Assessment: Defer to OT evaluation    Lower Extremity Assessment Lower Extremity Assessment: Generalized weakness       Communication   Communication: No difficulties  Cognition Arousal/Alertness: Awake/alert Behavior During Therapy: WFL for tasks assessed/performed Overall Cognitive Status: Within Functional Limits for tasks assessed                                        General Comments      Exercises     Assessment/Plan    PT Assessment Patient needs continued PT  services  PT Problem List Decreased strength;Decreased range of motion;Decreased activity tolerance;Decreased balance;Decreased mobility;Decreased safety awareness;Decreased coordination;Decreased knowledge of use of DME       PT Treatment Interventions DME instruction;Therapeutic exercise;Gait training;Balance training;Stair training;Functional mobility training;Therapeutic activities;Patient/family education    PT Goals (Current goals can be found in the Care Plan section)  Acute Rehab PT Goals Patient Stated Goal: Return home PT Goal Formulation: With patient Time For Goal Achievement: 12/13/20 Potential to Achieve Goals: Fair    Frequency Min 3X/week   Barriers to discharge        Co-evaluation               AM-PAC PT "6 Clicks" Mobility  Outcome Measure Help needed turning from your back to your side while in a flat bed without using bedrails?: A Lot Help needed moving from lying on your back to sitting on the side of a flat bed without using bedrails?: A Lot Help needed moving to and from a bed to a chair (including a wheelchair)?: A Lot Help needed standing up from a chair using your arms (e.g., wheelchair or bedside chair)?: A Lot Help needed to walk in hospital room?: A Lot Help needed climbing 3-5 steps  with a railing? : Total 6 Click Score: 11    End of Session   Activity Tolerance: Patient tolerated treatment well;Patient limited by fatigue Patient left: in bed;with call bell/phone within reach;with family/visitor present Nurse Communication: Mobility status PT Visit Diagnosis: Unsteadiness on feet (R26.81);Other abnormalities of gait and mobility (R26.89);Muscle weakness (generalized) (M62.81)    Time: 0156-1537 PT Time Calculation (min) (ACUTE ONLY): 22 min   Charges:   PT Evaluation $PT Eval Moderate Complexity: 1 Mod PT Treatments $Therapeutic Activity: 8-22 mins        Cassie Jones, SPT  During this treatment session, the therapist was  present, participating in and directing the treatment.  12:28 PM, 11/29/20 Lonell Grandchild, MPT Physical Therapist with [facility] [phone] office [phone] mobile phone

## 2020-11-30 ENCOUNTER — Inpatient Hospital Stay (HOSPITAL_COMMUNITY): Payer: PPO

## 2020-11-30 LAB — RENAL FUNCTION PANEL
Albumin: 2.5 g/dL — ABNORMAL LOW (ref 3.5–5.0)
Anion gap: 11 (ref 5–15)
BUN: 49 mg/dL — ABNORMAL HIGH (ref 8–23)
CO2: 25 mmol/L (ref 22–32)
Calcium: 8 mg/dL — ABNORMAL LOW (ref 8.9–10.3)
Chloride: 97 mmol/L — ABNORMAL LOW (ref 98–111)
Creatinine, Ser: 5.11 mg/dL — ABNORMAL HIGH (ref 0.61–1.24)
GFR, Estimated: 12 mL/min — ABNORMAL LOW (ref >60)
Glucose, Bld: 120 mg/dL — ABNORMAL HIGH (ref 70–99)
Phosphorus: 3.2 mg/dL (ref 2.5–4.6)
Potassium: 3.9 mmol/L (ref 3.5–5.1)
Sodium: 133 mmol/L — ABNORMAL LOW (ref 135–145)

## 2020-11-30 LAB — CBC
HCT: 29.7 % — ABNORMAL LOW (ref 39.0–52.0)
Hemoglobin: 9 g/dL — ABNORMAL LOW (ref 13.0–17.0)
MCH: 27.5 pg (ref 26.0–34.0)
MCHC: 30.3 g/dL (ref 30.0–36.0)
MCV: 90.8 fL (ref 80.0–100.0)
Platelets: 163 10*3/uL (ref 150–400)
RBC: 3.27 MIL/uL — ABNORMAL LOW (ref 4.22–5.81)
RDW: 18.4 % — ABNORMAL HIGH (ref 11.5–15.5)
WBC: 14 10*3/uL — ABNORMAL HIGH (ref 4.0–10.5)
nRBC: 0 % (ref 0.0–0.2)

## 2020-11-30 LAB — HEMOGLOBIN A1C
Hgb A1c MFr Bld: 6.3 % — ABNORMAL HIGH (ref 4.8–5.6)
Mean Plasma Glucose: 134.11 mg/dL

## 2020-11-30 LAB — HEPARIN LEVEL (UNFRACTIONATED): Heparin Unfractionated: >1.1 IU/mL — ABNORMAL HIGH (ref 0.30–0.70)

## 2020-11-30 LAB — APTT
aPTT: 49 seconds — ABNORMAL HIGH (ref 24–36)
aPTT: 42 seconds — ABNORMAL HIGH (ref 24–36)
aPTT: >200 seconds (ref 24–36)

## 2020-11-30 MED ORDER — CHLORHEXIDINE GLUCONATE CLOTH 2 % EX PADS
6.0000 | MEDICATED_PAD | Freq: Every day | CUTANEOUS | Status: DC
  Administered 2020-11-30 – 2020-12-08 (×5): 6 via TOPICAL

## 2020-11-30 NOTE — Progress Notes (Signed)
[facility] NEPHROLOGY PROGRESS NOTE  Assessment/ Plan: Pt is a 70 y.o. yo male  with a PMH signiciant for CAD s/p CABG, DM type 2, HTN, HLD, h/o DVT/PE, pulmonary HTN, OSA on CPAP, chronic CHF, and ESRD on HD MWF at [facility] who presented to [facility] ED after falling at home.  Dialysis Orders: Center: [facility]  on MWF . EDW 120.5kg HD Bath 3K/2.5Ca  Time 4 hours Heparin 2000 units bolus. Access RIJ TDC (maturing LUE AVF) BFR 400 DFR 800    Calcitriol 0.5 mcg po/HD Venofer  100 mg TIW, Micera 100 mcg IVP every 2 weeks   #Acute hypoxic respiratory failure presumed due to volume overload/acute diastolic CHF: Mild improvement with ultrafiltration.  Challenge dry weight with dialysis.  #MRSA bactermia: febrile this am. S/p TDC removal on 10/19. Continue IV vancomycim TEE per ID. Follow repeat blood culture.   # ESRD MWF: s/p HD yesterday with 3.5 L UF, tolerated well. AVF cannulated but high venous pressure. Plan for next HD tomorrow. We will attempt to cannulate AVF first. If there is problem with AVF then the patient will need temporary HD catheter until bacteremia clears. Dr. Constance Haw is following.    # Anemia: Hemoglobin variable, will order Aranesp with HD tomorrow. No iron.   # Secondary hyperparathyroidism: Monitor calcium phosphorus level.  Continue calcitriol, phoslo.  # HTN/volume: Monitor blood pressure.  Challenge ultrafiltration to maintain euvolemia.  # Hyponatremia, hypervolumic: UF during HD, Na improving. Recommend fluid restriction.   Subjective: Seen and examined at bedside. HD yesterday and removed TDC. Breathing is better. Still febrile. No N/V/CP.   Objective Vital signs in last 24 hours: Vitals:   11/29/20 1700 11/29/20 1907 11/30/20 0318 11/30/20 0600  BP:  (!) 111/57 (!) 125/59   Pulse: 94 87 92   Resp: (!) 30 18 20    Temp:  98.5 F (36.9 C) (!) 101.1 F (38.4 C) 98.6 F (37 C)  TempSrc:   Oral Oral  SpO2: 94% 90% 92%   Weight:      Height:       Weight  change:   Intake/Output Summary (Last 24 hours) at 11/30/2020 0845 Last data filed at 11/30/2020 0300 Gross per 24 hour  Intake 276.07 ml  Output 3556 ml  Net -3279.93 ml        Labs: Basic Metabolic Panel: Recent Labs  Lab 11/27/20 1711 11/28/20 0449 11/29/20 0440 11/30/20 0453  NA 135 135 131* 133*  K 3.8 3.9 4.1 3.9  CL 100 99 95* 97*  CO2 24 26 26 25   GLUCOSE 178* 126* 138* 120*  BUN 74* 41* 60* 49*  CREATININE 6.75* 4.38* 6.10* 5.11*  CALCIUM 8.0* 8.1* 7.9* 8.0*  PHOS 4.4  --   --  3.2    Liver Function Tests: Recent Labs  Lab 11/27/20 1711 11/30/20 0453  ALBUMIN 2.6* 2.5*    No results for input(s): LIPASE, AMYLASE in the last 168 hours. No results for input(s): AMMONIA in the last 168 hours. CBC: Recent Labs  Lab 11/27/20 1711 11/28/20 0449 11/29/20 0440 11/30/20 0453  WBC 15.8* 14.7* 15.0* 14.0*  HGB 9.1* 10.0* 9.4* 9.0*  HCT 30.1* 32.4* 31.9* 29.7*  MCV 90.4 90.3 92.7 90.8  PLT 156 185 162 163    Cardiac Enzymes: No results for input(s): CKTOTAL, CKMB, CKMBINDEX, TROPONINI in the last 168 hours. CBG: Recent Labs  Lab 11/27/20 2216 11/29/20 2009  GLUCAP 128* 130*     Iron Studies: No results for input(s):  IRON, TIBC, TRANSFERRIN, FERRITIN in the last 72 hours. Studies/Results: DG Elbow 2 Views Right  Result Date: 11/29/2020 CLINICAL DATA:  Pain and swelling in the right elbow lung recent fall, initial encounter EXAM: RIGHT ELBOW - 2 VIEW COMPARISON:  None. FINDINGS: Mild degenerative changes of the right elbow joint are seen. No acute fracture or dislocation is noted. Small joint effusion is noted anteriorly. Olecranon spurring is seen. IMPRESSION: Small joint effusion without acute fracture. Electronically Signed   By: Inez Catalina M.D.   On: 11/29/2020 18:58   DG Chest Port 1 View  Result Date: 11/29/2020 CLINICAL DATA:  Removal of Port-A-Cath EXAM: PORTABLE CHEST 1 VIEW COMPARISON:  11/27/2020 FINDINGS: There is been interval  removal of hemodialysis catheter on the right. No visible retained fragments. Prior CABG. Cardiomegaly. Vascular congestion. Bilateral layering effusions and lower lobe airspace opacity. Probable mild edema/CHF. IMPRESSION: Interval removal of right hemodialysis catheter. CHF.  Layering effusions. Electronically Signed   By: Rolm Baptise M.D.   On: 11/29/2020 16:58   ECHOCARDIOGRAM LIMITED  Result Date: 11/28/2020    ECHOCARDIOGRAM LIMITED REPORT   Patient Name:   Travis Blair [facility] Date of Exam: 11/28/2020 Medical Rec #:  [id number]           Height:       74.0 in Accession #:    [id number]          Weight:       261.9 lb Date of Birth:  [birth year]           BSA:          2.437 m Patient Age:    49 years            BP:           124/56 mmHg Patient Gender: M                   HR:           72 bpm. Exam Location:  [facility] Procedure: Limited Echo, Color Doppler and Cardiac Doppler Indications:    Dyspnea R06.00  History:        Patient has prior history of Echocardiogram examinations, most                 recent 06/06/2020. CAD, Prior CABG, Arrythmias:Atrial                 Fibrillation, Signs/Symptoms:Shortness of Breath; Risk                 Factors:Hypertension, Diabetes and Dyslipidemia.  Sonographer:    Alvino Chapel RCS Referring Phys: [id number] [place]  1. Left ventricular ejection fraction, by estimation, is 45 to 50%. The left ventricle has mildly decreased function. The left ventricle demonstrates regional wall motion abnormalities (see scoring diagram/findings for description). There is severe asymmetric left ventricular hypertrophy of the basal and septal segments. Left ventricular diastolic parameters are indeterminate. There is the interventricular septum is flattened in systole and diastole, consistent with right ventricular pressure and volume overload.  2. Right ventricular systolic function is low normal. The right ventricular size is mildly enlarged. There is  moderately elevated pulmonary artery systolic pressure. The estimated right ventricular systolic pressure is 86.7 mmHg.  3. Left atrial size was mildly dilated.  4. Right atrial size was mildly dilated.  5. Left pleural effusion evident. A small pericardial effusion is present. The pericardial effusion is posterior to the left ventricle.  6. The  mitral valve is abnormal. Mild mitral valve regurgitation.  7. The aortic valve is tricuspid. There is moderate calcification of the aortic valve. Aortic valve regurgitation is not visualized.  8. The inferior vena cava is dilated in size with <50% respiratory variability, suggesting right atrial pressure of 15 mmHg. Comparison(s): Prior images reviewed side by side. Reduced LVEF and wall motion abnormalities are not new based on my review of the prior study. FINDINGS  Left Ventricle: Left ventricular ejection fraction, by estimation, is 45 to 50%. The left ventricle has mildly decreased function. The left ventricle demonstrates regional wall motion abnormalities. Definity contrast agent was given IV to delineate the left ventricular endocardial borders. There is severe asymmetric left ventricular hypertrophy of the basal and septal segments. The interventricular septum is flattened in systole and diastole, consistent with right ventricular pressure and volume overload. Left ventricular diastolic parameters are indeterminate. Left ventricular diastolic function could not be evaluated due to atrial fibrillation.  LV Wall Scoring: The basal inferior segment is akinetic. The posterior wall, mid inferoseptal segment, mid inferior segment, and basal inferoseptal segment are hypokinetic. The entire anterior wall, antero-lateral wall, entire anterior septum, and entire apex are normal. Right Ventricle: The right ventricular size is mildly enlarged. No increase in right ventricular wall thickness. Right ventricular systolic function is low normal. There is moderately elevated  pulmonary artery systolic pressure. The tricuspid regurgitant  velocity is 3.12 m/s, and with an assumed right atrial pressure of 15 mmHg, the estimated right ventricular systolic pressure is 70.3 mmHg. Left Atrium: Left atrial size was mildly dilated. Right Atrium: Right atrial size was mildly dilated. Pericardium: Left pleural effusion evident. A small pericardial effusion is present. The pericardial effusion is posterior to the left ventricle. Mitral Valve: The mitral valve is abnormal. There is mild thickening of the mitral valve leaflet(s). There is mild calcification of the mitral valve leaflet(s). Mild mitral valve regurgitation. Tricuspid Valve: The tricuspid valve is grossly normal. Tricuspid valve regurgitation is mild. Aortic Valve: The aortic valve is tricuspid. There is moderate calcification of the aortic valve. There is mild to moderate aortic valve annular calcification. Aortic valve regurgitation is not visualized. Aorta: The aortic root is normal in size and structure. Venous: The inferior vena cava is dilated in size with less than 50% respiratory variability, suggesting right atrial pressure of 15 mmHg. IAS/Shunts: No atrial level shunt detected by color flow Doppler. LEFT VENTRICLE PLAX 2D LVIDd:         5.50 cm LVIDs:         4.30 cm LV PW:         1.30 cm LV IVS:        1.80 cm LVOT diam:     2.30 cm LVOT Area:     4.15 cm  RIGHT VENTRICLE TAPSE (M-mode): 1.7 cm LEFT ATRIUM              Index        RIGHT ATRIUM           Index LA diam:        4.20 cm  1.72 cm/m   RA Area:     27.30 cm LA Vol (A2C):   102.0 ml 41.85 ml/m  RA Volume:   96.50 ml  39.59 ml/m LA Vol (A4C):   74.5 ml  30.57 ml/m LA Biplane Vol: 91.8 ml  37.66 ml/m   AORTA Ao Root diam: 3.80 cm MITRAL VALVE  TRICUSPID VALVE MV Area (PHT): 5.02 cm     TR Peak grad:   38.9 mmHg MV Decel Time: 151 msec     TR Vmax:        312.00 cm/s MV E velocity: 143.00 cm/s                             SHUNTS                              Systemic Diam: 2.30 cm Rozann Lesches MD Electronically signed by Rozann Lesches MD Signature Date/Time: 11/28/2020/1:03:27 PM    Final     Medications: Infusions:  sodium chloride     sodium chloride     heparin 1,400 Units/hr (11/29/20 2133)   vancomycin 1,000 mg (11/29/20 1400)    Scheduled Medications:  calcitRIOL  0.5 mcg Oral Q M,W,F   calcium acetate  1,334 mg Oral TID with meals   Chlorhexidine Gluconate Cloth  6 each Topical Q0600   Chlorhexidine Gluconate Cloth  6 each Topical Q0600   loratadine  10 mg Oral q1800   mouth rinse  15 mL Mouth Rinse BID   mupirocin ointment   Nasal BID   pantoprazole  40 mg Oral Daily    have reviewed scheduled and prn medications.  Physical Exam: General:NAD, comfortable Heart:RRR, s1s2 nl Lungs: Bibasal rhonchi, no increased work of breathing Abdomen:soft, Non-tender, non-distended Extremities: Lower extremity pitting edema+ Dialysis Access: Right IJ TDC removed and has dressing on.  Left upper extremity AV fistula has good thrill and bruit.  Barnaby Rippeon Prasad Hilary Pundt 11/30/2020,8:45 AM  LOS: 3 days

## 2020-11-30 NOTE — Progress Notes (Signed)
    [place] for [facility]   Date of Admission:  11/27/2020     Reason for visit: Follow up on MRSA bacteremia  Interval History:  S/p TDC removal yesterday by Dr Constance Haw Repeat blood cx obtained Plan for next HD tomorrow with attempt to use maturing fistula.  If unsuccessful, will need temp HD line Febrile this AM Elbow x-ray with small effusion after fall   Assessment:  MRSA bacteremia s/p tunneled HD cath 10/19 Acute hypoxic respiratory failure presumed to volume overload, acute CHF ESRD Right DFU: x-ray negative   Recommendations: --Continue vancomycin --TEE --MRI right foot to evaluate for OM, may need vascular evaluation to ensure adequate blood flow to lower extremities --Follow up repeat blood cultures --Will follow  [name] for Infectious [place] [phone] pager 11/30/2020, 9:33 AM

## 2020-11-30 NOTE — Evaluation (Signed)
Occupational Therapy Evaluation Patient Details Name: Travis Blair MRN: [id number] DOB: 09/20/1950 Today's Date: 11/30/2020   History of Present Illness Travis Blair is a 70 y.o. male with medical history significant of CHF, ERSD on HD (M-W-F), CAD, DVT/PE, and T2DM, CABG with post operative atrial fibrillation, pulmonary hypertension who presented with dyspnea.   Patient reported having cough for about 2 weeks, persistent and worsening.  Tried several antitussive agents at home without improvement of symptoms.  He went to dialysis on Friday, 3 days ago, yesterday he was noted to be dyspneic by his wife, moderate in intensity, associated with cough but no chest pain.  Last night he was noted to have poor balance, and he declined to be brought to the emergency department.  This morning he fell while in the bathroom, landed between the commode and the wall, EMS was called and patient was brought to the hospital.     His main symptom seems to be increased work of breathing/dyspnea, moderate to severe in intensity, worse with exertion, associated with cough, no chest pain, no improving factors.  Last night not able to sleep due to persistent cough, dry.   He continues to make urine 3 times daily.     In June 2022 he decided to stop apixaban due to weakness, he was placed on clopidogrel.  Last year he had COVID-pneumonia and pulm embolism.  About 3 months ago he was started on hemodialysis, he has a fistula which is not mature yet.  He uses a tunneled dialysis catheter for dialysis.   Clinical Impression   Pt agreeable to OT evaluation. Pt demonstrates B UE weakness with difficulty lifting arms overhead. Pt reqported need to use the [facility] at start of session. Pt required mod A for supine to sit bed mobility with HOB raised and mod A for initial sit to stand form bed with RW. Min to mod A needed for SPT to [facility] using RW. Pt required max A for peri-care but was able to stand using RW while assisted by  this therapist. Pt left in bed with nurse and family member present. Pt will benefit from continued OT in the hospital and recommended venue below to increase strength, balance, and endurance for safe ADL's.        Recommendations for follow up therapy are one component of a multi-disciplinary discharge planning process, led by the attending physician.  Recommendations may be updated based on patient status, additional functional criteria and insurance authorization.   Follow Up Recommendations  SNF    Equipment Recommendations  None recommended by OT           Precautions / Restrictions Precautions Precautions: Fall Restrictions Weight Bearing Restrictions: No      Mobility Bed Mobility Overal bed mobility: Needs Assistance Bed Mobility: Supine to Sit;Sit to Supine     Supine to sit: Mod assist Sit to supine: Mod assist   General bed mobility comments: slow labored movement, required assistance for lifting legs onto the bed    Transfers Overall transfer level: Needs assistance Equipment used: Rolling walker (2 wheeled) Transfers: Sit to/from [facility] Sit to Stand: Mod assist Stand pivot transfers: Min assist;Mod assist       General transfer comment: Using RW with slow labored movement.    Balance Overall balance assessment: Needs assistance Sitting-balance support: Feet supported;Single extremity supported Sitting balance-Leahy Scale: Fair Sitting balance - Comments: at EOB   Standing balance support: Bilateral upper extremity supported;During functional activity Standing balance-Leahy  Scale: Poor Standing balance comment: poor using RW                           ADL either performed or assessed with clinical judgement   ADL Overall ADL's : Needs assistance/impaired                         Toilet Transfer: Moderate assistance;Stand-pivot;BSC;RW Toilet Transfer Details (indicate cue type and reason): EOB to [facility]  with RW Toileting- Clothing Manipulation and Hygiene: Sit to/from stand;With adaptive equipment;Maximal assistance Toileting - Clothing Manipulation Details (indicate cue type and reason): Pt able to stand using RW while this therapist complete peri-care for pt.             Vision Baseline Vision/History: 1 Wears glasses Ability to See in Adequate Light: 0 Adequate Patient Visual Report: No change from baseline Vision Assessment?: No apparent visual deficits (per observation)                Pertinent Vitals/Pain Pain Assessment: No/denies pain     Hand Dominance Right   Extremity/Trunk Assessment Upper Extremity Assessment Upper Extremity Assessment: Generalized weakness (3-/5 MMT for shoulder flexion and abduction bilatreally.)   Lower Extremity Assessment Lower Extremity Assessment: Defer to PT evaluation   Cervical / Trunk Assessment Cervical / Trunk Assessment: Kyphotic   Communication Communication Communication: No difficulties   Cognition Arousal/Alertness: Awake/alert Behavior During Therapy: WFL for tasks assessed/performed Overall Cognitive Status: Within Functional Limits for tasks assessed                                                Home Living Family/patient expects to be discharged to:: Private residence Living Arrangements: Spouse/significant other Available Help at Discharge: Family;Available 24 hours/day Type of Home: House Home Access: Stairs to enter [employer] of Steps: 3 Entrance Stairs-Rails: Left Home Layout: One level         Bathroom Toilet: Standard     Home Equipment: Walker - 4 wheels;Cane - single point;Adaptive equipment;Tub bench;Grab bars - toilet;Other (comment)   Additional Comments: Taken per PT documentation.      Prior Functioning/Environment Level of Independence: Independent with assistive device(s)        Comments: Wife and patient report patient is able to ambulate w/ cane,  RW, or Rollator depending on the day and distance. Pt takes sponge baths standing at sink without assist. Pt is reportedly independent for dressing.        OT Problem List: Decreased strength;Decreased range of motion;Decreased activity tolerance;Impaired balance (sitting and/or standing)      OT Treatment/Interventions: Self-care/ADL training;Therapeutic exercise;Therapeutic activities;Patient/family education;Balance training    OT Goals(Current goals can be found in the care plan section) Acute Rehab OT Goals Patient Stated Goal: Pt open to rehab. OT Goal Formulation: With patient Time For Goal Achievement: 12/14/20 Potential to Achieve Goals: Good  OT Frequency: Min 2X/week    End of Session Equipment Utilized During Treatment: Rolling walker  Activity Tolerance: Patient tolerated treatment well Patient left: in bed;with call bell/phone within reach;with bed alarm set;with family/visitor present;with nursing/sitter in room  OT Visit Diagnosis: Unsteadiness on feet (R26.81);Muscle weakness (generalized) (M62.81);History of falling (Z91.81)                Time: 2426-8341 OT Time Calculation (min):  25 min Charges:  OT General Charges $OT Visit: 1 Visit OT Evaluation $OT Eval Moderate Complexity: 1 Mod  Serah Nicoletti OT, MOT  Larey Seat 11/30/2020, 12:24 PM

## 2020-11-30 NOTE — Plan of Care (Signed)
  Problem: Acute Rehab OT Goals (only OT should resolve) Goal: Pt. Will Perform Grooming Flowsheets (Taken 11/30/2020 1228) Pt Will Perform Grooming:  with supervision  standing  with adaptive equipment Goal: Pt. Will Perform Upper Body Dressing Flowsheets (Taken 11/30/2020 1228) Pt Will Perform Upper Body Dressing:  with modified independence  sitting Goal: Pt. Will Perform Lower Body Dressing Flowsheets (Taken 11/30/2020 1228) Pt Will Perform Lower Body Dressing:  with min assist  sitting/lateral leans  with adaptive equipment Goal: Pt. Will Transfer To Toilet [place] (Taken 11/30/2020 1228) Pt Will Transfer to Toilet:  with supervision  with min guard assist  stand pivot transfer  bedside commode Goal: Pt/Caregiver Will Perform Home Exercise Program Flowsheets (Taken 11/30/2020 1228) Pt/caregiver will Perform Home Exercise Program:  Increased ROM  Increased strength  Both right and left upper extremity  Independently  Oleta Gunnoe OT, MOT

## 2020-11-30 NOTE — Progress Notes (Signed)
ANTICOAGULATION CONSULT NOTE  Pharmacy Consult for Heparin Indication: hx DVT/afib  Allergies  Allergen Reactions   Latex Hives and Rash   Lipitor [Atorvastatin]     Leg aches     Patient Measurements: Height: 6\' 2"  (188 cm) Weight: 117.9 kg (259 lb 14.8 oz) IBW/kg (Calculated) : 82.2 HEPARIN DW (KG): 107.6   Vital Signs: Temp: 101.1 F (38.4 C) (10/20 0318) Temp Source: Oral (10/20 0318) BP: 125/59 (10/20 0318) Pulse Rate: 92 (10/20 0318)  Labs: Recent Labs    11/27/20 1008 11/27/20 1008 11/27/20 1150 11/27/20 1711 11/28/20 0449 11/29/20 0440 11/30/20 0453  HGB  --    < >  --  9.1* 10.0* 9.4* 9.0*  HCT  --    < >  --  30.1* 32.4* 31.9* 29.7*  PLT  --    < >  --  156 185 162 163  APTT 36  --   --   --   --   --  49*  HEPARINUNFRC  --   --   --   --   --   --  >1.10*  CREATININE 6.38*  --   --  6.75* 4.38* 6.10*  --   TROPONINIHS 312*  --  485*  --   --   --   --    < > = values in this interval not displayed.     Estimated Creatinine Clearance: 15.6 mL/min (A) (by C-G formula based on SCr of 6.1 mg/dL (H)).   Assessment: 70 y.o. male with h/o DVT and Afib, Eliquis on hold, for heparin.  aPTT subtherapeutic  Goal of Therapy:  APTT 66-102 sec Heparin level 0.3-0.7 units/ml Monitor platelets by anticoagulation protocol: Yes   Plan:  Increase Heparin 1750 units/hr aPTT in 8 hours  Phillis Knack, PharmD, BCPS  11/30/2020 6:23 AM

## 2020-11-30 NOTE — TOC Progression Note (Signed)
Transition of Care [facility]) - Progression Note    Patient Details  Name: Travis Blair MRN: [id number] Date of Birth: 06/29/1950  [facility]) CM/SW Contact  Shade Flood, LCSW Phone Number: 11/30/2020, 10:47 AM  Clinical Narrative:     TOC following. Reviewed bed offer with pt's wife. Referred out to additional SNFs at her request. MD anticipating dc early next week. TOC will start insurance auth when pt closer to dc. Will follow for rehab placement.  Expected Discharge Plan: [place] Barriers to Discharge: Continued Medical Work up  Expected Discharge Plan and Services Expected Discharge Plan: [place] In-house Referral: Clinical Social Work   Post Acute Care Choice: [place] arrangements for the past 2 months: Single Family Home                                       Social Determinants of Health (SDOH) Interventions    Readmission Risk Interventions Readmission Risk Prevention Plan 11/28/2020 09/08/2020  Transportation Screening Complete Complete  PCP or Specialist Appt within 5-7 Days - Complete  Home Care Screening - Complete  Medication Review (RN CM) - Complete  HRI or Home Care Consult Complete -  Social Work Consult for Kindred Planning/Counseling Complete -  Palliative Care Screening Not Applicable -  Medication Review Press photographer) Complete -  Some recent data might be hidden

## 2020-11-30 NOTE — Progress Notes (Signed)
PROGRESS NOTE  Travis Blair LTJ:[id number] DOB: 1950-05-01 DOA: 11/27/2020 PCP: [name], MD  Brief History:   70 y.o. male with medical history significant of CHF, ERSD on HD (M-W-F), CAD, DVT/PE, and T2DM, CABG with post operative atrial fibrillation, pulmonary hypertension who presented with dyspnea.  Patient reported having cough for about 2 weeks, persistent and worsening.  Tried several antitussive agents at home without improvement of symptoms.  He went to dialysis on Friday, oct 14. He e was noted to be dyspneic by his wife, moderate in intensity, associated with cough.  He has had a chronic cough for at least one year, worse over last week prior to [place].  He took some robitussin and codeine from an old prescription on 11/26/20.  On Monday, he was more unsteady and fell in the BR.  He had subjective f/c at home since 11/24/20.  He denied n/v/d, abd pain.     His main symptom seems to be increased work of breathing/dyspnea, moderate to severe in intensity, worse with exertion, associated with cough, no chest pain, no improving factors. Last night not able to sleep due to persistent cough, dry.  He continues to make urine 3 times daily.   In June 2022 he decided to stop apixaban due to weakness, he was placed on clopidogrel. Last year he had COVID-pneumonia and pulm embolism. About 3 months ago he was started on hemodialysis, he has a fistula which is not mature yet.  He uses a tunneled dialysis catheter for dialysis.  Since admission, nephrology was consulted to assist.  He was dialyzed on 10/17 and 10/19.  He was found to have MRSA bacteremia.  General surgery was consulted to remove his Permacath.  He was started on IV vancomycin 11/29/20.   Assessment/Plan: Sepsis  -present on admission -had fever and leukocytosis -due to MRSA bacteremia -temps trending down   MRSA bacteremia -removed tunneled HD catheter 10/19 after HD -follow surveillance blood  cultures -temporary vascath planned 10/21 if cannot use fistula -consult cardiology for TEE  Right Diabetic foot ulcer -does not appear infected on exam -spouse states wound is improving with care at home -MR right foot per ID -see pics below -consult wound care   Acute respiratory failure with hypoxia -due to pulmonary edema -presented with oxygen saturation 87% on RA and tachypnea -stable on 2L -wean off oxygen for saturation >92%   Acute on chronic diastolic CHF/Acute systolic CHF -0/76/22 Echo 60-65%, no WMA; RV systolic function preserved.  RSVP 57.3.  No significant valvular disease. -fluid removal on HD -11/28/20 Echo EF 45-50%+WMA, RV overload   ESRD -appreciate renal consult -HD on 10/17, 10/19   History DVT/PE -hold apixaban temporarily in preparation for new catheter insertion   Chronic Afib -AC as discussed above -continue IV heparin   Uncontrolled Diabetes mellitus type 2 with hyperglycemia -08/18/20 A1C--8.0 -CBGs controlled so far -novolog sliding scale -10/19- A1C 6.3   Right Elbow pain -due to recent fall on 11/27/20 -xray--no fracture; small anterior joint eff           Status is: Inpatient   Remains inpatient appropriate because: severity of illness               Family Communication:   spouse updated at bedside 10/20   Consultants:  renal, general surgery   Code Status:  FULL / DNR   DVT Prophylaxis:  IV Heparin      Procedures: As  Listed in Progress Note Above   Antibiotics: Vanco 10/19>>     Subjective: Patient states sob continues to improve. Denies cp, n/v/d, abd pain.  Denies any right foot pain.  Left elbow pain is improving  Objective: Vitals:   11/29/20 1907 11/30/20 0318 11/30/20 0600 11/30/20 1344  BP: (!) 111/57 (!) 125/59  126/62  Pulse: 87 92  90  Resp: 18 20  18   Temp: 98.5 F (36.9 C) (!) 101.1 F (38.4 C) 98.6 F (37 C) 98.8 F (37.1 C)  TempSrc:  Oral Oral Oral  SpO2: 90% 92%  94%  Weight:       Height:        Intake/Output Summary (Last 24 hours) at 11/30/2020 1647 Last data filed at 11/30/2020 1300 Gross per 24 hour  Intake 636.07 ml  Output --  Net 636.07 ml   Weight change:  Exam:  General:  Pt is alert, follows commands appropriately, not in acute distress HEENT: No icterus, No thrush, No neck mass, [place]/AT Cardiovascular: RRR, S1/S2, no rubs, no gallops Respiratory: bibasilar crackles. No wheeze Abdomen: Soft/+BS, non tender, non distended, no guarding Extremities: trace LE edema, No lymphangitis, No petechiae, No rashes, no synovitis RIGHT FOOT       Data Reviewed: I have personally reviewed following labs and imaging studies Basic Metabolic Panel: Recent Labs  Lab 11/27/20 1008 11/27/20 1711 11/28/20 0449 11/29/20 0440 11/30/20 0453  NA 136 135 135 131* 133*  K 4.4 3.8 3.9 4.1 3.9  CL 100 100 99 95* 97*  CO2 22 24 26 26 25   GLUCOSE 178* 178* 126* 138* 120*  BUN 69* 74* 41* 60* 49*  CREATININE 6.38* 6.75* 4.38* 6.10* 5.11*  CALCIUM 8.4* 8.0* 8.1* 7.9* 8.0*  PHOS  --  4.4  --   --  3.2   Liver Function Tests: Recent Labs  Lab 11/27/20 1711 11/30/20 0453  ALBUMIN 2.6* 2.5*   No results for input(s): LIPASE, AMYLASE in the last 168 hours. No results for input(s): AMMONIA in the last 168 hours. Coagulation Profile: No results for input(s): INR, PROTIME in the last 168 hours. CBC: Recent Labs  Lab 11/27/20 1711 11/28/20 0449 11/29/20 0440 11/30/20 0453  WBC 15.8* 14.7* 15.0* 14.0*  HGB 9.1* 10.0* 9.4* 9.0*  HCT 30.1* 32.4* 31.9* 29.7*  MCV 90.4 90.3 92.7 90.8  PLT 156 185 162 163   Cardiac Enzymes: No results for input(s): CKTOTAL, CKMB, CKMBINDEX, TROPONINI in the last 168 hours. BNP: Invalid input(s): POCBNP CBG: Recent Labs  Lab 11/27/20 2216 11/29/20 2009  GLUCAP 128* 130*   HbA1C: Recent Labs    11/30/20 0453  HGBA1C 6.3*   Urine analysis:    Component Value Date/Time   COLORURINE YELLOW 03/10/2017 1553    APPEARANCEUR HAZY (A) 03/10/2017 1553   LABSPEC 1.013 03/10/2017 1553   PHURINE 5.0 03/10/2017 1553   GLUCOSEU NEGATIVE 03/10/2017 1553   HGBUR NEGATIVE 03/10/2017 1553   BILIRUBINUR NEGATIVE 03/10/2017 1553   KETONESUR NEGATIVE 03/10/2017 1553   PROTEINUR 30 (A) 03/10/2017 1553   NITRITE NEGATIVE 03/10/2017 1553   LEUKOCYTESUR NEGATIVE 03/10/2017 1553   Sepsis Labs: @LABRCNTIP (procalcitonin:4,lacticidven:4) ) Recent Results (from the past 240 hour(s))  Resp Panel by RT-PCR (Flu A&B, Covid) Nasopharyngeal Swab     Status: None   Collection Time: 11/27/20 10:09 AM   Specimen: Nasopharyngeal Swab; Nasopharyngeal(NP) swabs in vial transport medium  Result Value Ref Range Status   SARS Coronavirus 2 by RT PCR NEGATIVE NEGATIVE Final  Comment: (NOTE) SARS-CoV-2 target nucleic acids are NOT DETECTED.  The SARS-CoV-2 RNA is generally detectable in upper respiratory specimens during the acute phase of infection. The lowest concentration of SARS-CoV-2 viral copies this assay can detect is 138 copies/mL. A negative result does not preclude SARS-Cov-2 infection and should not be used as the sole basis for treatment or other patient management decisions. A negative result may occur with  improper specimen collection/handling, submission of specimen other than nasopharyngeal swab, presence of viral mutation(s) within the areas targeted by this assay, and inadequate number of viral copies(<138 copies/mL). A negative result must be combined with clinical observations, patient history, and epidemiological information. The expected result is Negative.  Fact Sheet for Patients:  EntrepreneurPulse.com.au  Fact Sheet for Healthcare Providers:  IncredibleEmployment.be  This test is no t yet approved or cleared by the [place] FDA and  has been authorized for detection and/or diagnosis of SARS-CoV-2 by FDA under an Emergency Use Authorization (EUA).  This EUA will remain  in effect (meaning this test can be used) for the duration of the COVID-19 declaration under Section 564(b)(1) of the Act, 21 U.S.C.section 360bbb-3(b)(1), unless the authorization is terminated  or revoked sooner.       Influenza A by PCR NEGATIVE NEGATIVE Final   Influenza B by PCR NEGATIVE NEGATIVE Final    Comment: (NOTE) The Xpert Xpress SARS-CoV-2/FLU/RSV plus assay is intended as an aid in the diagnosis of influenza from Nasopharyngeal swab specimens and should not be used as a sole basis for treatment. Nasal washings and aspirates are unacceptable for Xpert Xpress SARS-CoV-2/FLU/RSV testing.  Fact Sheet for Patients: EntrepreneurPulse.com.au  Fact Sheet for Healthcare Providers: IncredibleEmployment.be  This test is not yet approved or cleared by the [place] FDA and has been authorized for detection and/or diagnosis of SARS-CoV-2 by FDA under an Emergency Use Authorization (EUA). This EUA will remain in effect (meaning this test can be used) for the duration of the COVID-19 declaration under Section 564(b)(1) of the Act, 21 U.S.C. section 360bbb-3(b)(1), unless the authorization is terminated or revoked.  Performed at [facility], [address], [place], [place] [address]   MRSA Next Gen by PCR, Nasal     Status: Abnormal   Collection Time: 11/27/20  4:10 PM   Specimen: Nasal Mucosa; Nasal Swab  Result Value Ref Range Status   MRSA by PCR Next Gen DETECTED (A) NOT DETECTED Final    Comment: RESULT CALLED TO, READ BACK BY AND VERIFIED WITH: HEATHER TETREAULT,1951,11/27/2020,SELF S. (NOTE) The GeneXpert MRSA Assay (FDA approved for NASAL specimens only), is one component of a comprehensive MRSA colonization surveillance program. It is not intended to diagnose MRSA infection nor to guide or monitor treatment for MRSA infections. Test performance is not FDA approved in patients less than 55  years old. Performed at [facility] [place], [address] [place], [place] [address]   Culture, blood (Routine X 2) w Reflex to ID Panel     Status: None (Preliminary result)   Collection Time: 11/28/20  9:34 AM   Specimen: BLOOD RIGHT HAND  Result Value Ref Range Status   Specimen Description   Final    BLOOD RIGHT HAND Performed at [facility], [address], [place], [place] [address]    Special Requests   Final    Immunocompromised Blood Culture adequate volume BOTTLES DRAWN AEROBIC AND ANAEROBIC Performed at [facility], [address], [place], [place] [address]    Culture  Setup Time   Final  GRAM POSITIVE COCCI ANAEROBIC BOTTLE ONLY Gram Stain Report Called to,Read Back By and Verified WithLynwood Dawley, 2216, 11/28/2020,SELF S aerobic bottle previously called 10.18.22 2353 matthews, b  Performed at [facility], [address] [place], [place] [address]    Culture [facility] POSITIVE COCCI  Final   Report Status PENDING  Incomplete  Culture, blood (Routine X 2) w Reflex to ID Panel     Status: Abnormal (Preliminary result)   Collection Time: 11/28/20  9:34 AM   Specimen: BLOOD RIGHT FOREARM  Result Value Ref Range Status   Specimen Description   Final    BLOOD RIGHT FOREARM Performed at [facility], [address], [place], [place] [address]    Special Requests   Final    Immunocompromised Blood Culture adequate volume BOTTLES DRAWN AEROBIC AND ANAEROBIC Performed at [facility], [address] [place], [place] [address]    Culture  Setup Time   Final    GRAM POSITIVE COCCI IN BOTH AEROBIC AND ANAEROBIC BOTTLES Gram Stain Report Called to,Read Back By and Verified With: T.ALLEN,2236,11/28/2020,SELF S aerobic bottle [facility] previously called [email or web address], b 10.18.22 2355 CRITICAL RESULT CALLED TO, READ BACK BY AND VERIFIED WITH: T ALLEN,RN@0209  11/29/20 [place]    Culture (A)  Final    STAPHYLOCOCCUS AUREUS CULTURE REINCUBATED FOR BETTER GROWTH SUSCEPTIBILITIES TO FOLLOW Performed  at [facility], [address], [place], [place] [address]    Report Status PENDING  Incomplete  Blood Culture ID Panel (Reflexed)     Status: Abnormal   Collection Time: 11/28/20  9:34 AM  Result Value Ref Range Status   Enterococcus faecalis NOT DETECTED NOT DETECTED Final   Enterococcus Faecium NOT DETECTED NOT DETECTED Final   Listeria monocytogenes NOT DETECTED NOT DETECTED Final   Staphylococcus species DETECTED (A) NOT DETECTED Final    Comment: CRITICAL RESULT CALLED TO, READ BACK BY AND VERIFIED WITH: T ALLEN,RN@0209  11/29/20 [place]    Staphylococcus aureus (BCID) DETECTED (A) NOT DETECTED Final    Comment: Methicillin (oxacillin)-resistant Staphylococcus aureus (MRSA). MRSA is predictably resistant to beta-lactam antibiotics (except ceftaroline). Preferred therapy is vancomycin unless clinically contraindicated. Patient requires contact precautions if  hospitalized. CRITICAL RESULT CALLED TO, READ BACK BY AND VERIFIED WITH: T ALLEN,RN@0209  11/29/20 [place]    Staphylococcus epidermidis NOT DETECTED NOT DETECTED Final   Staphylococcus lugdunensis NOT DETECTED NOT DETECTED Final   Streptococcus species DETECTED (A) NOT DETECTED Final    Comment: Not Enterococcus species, Streptococcus agalactiae, Streptococcus pyogenes, or Streptococcus pneumoniae. CRITICAL RESULT CALLED TO, READ BACK BY AND VERIFIED WITH: T ALLEN,RN@ 0209 11/29/20 [place]    Streptococcus agalactiae NOT DETECTED NOT DETECTED Final   Streptococcus pneumoniae NOT DETECTED NOT DETECTED Final   Streptococcus pyogenes NOT DETECTED NOT DETECTED Final   A.calcoaceticus-baumannii NOT DETECTED NOT DETECTED Final   Bacteroides fragilis NOT DETECTED NOT DETECTED Final   Enterobacterales NOT DETECTED NOT DETECTED Final   Enterobacter cloacae complex NOT DETECTED NOT DETECTED Final   Escherichia coli NOT DETECTED NOT DETECTED Final   Klebsiella aerogenes NOT DETECTED NOT DETECTED Final   Klebsiella oxytoca NOT DETECTED NOT  DETECTED Final   Klebsiella pneumoniae NOT DETECTED NOT DETECTED Final   Proteus species NOT DETECTED NOT DETECTED Final   Salmonella species NOT DETECTED NOT DETECTED Final   Serratia marcescens NOT DETECTED NOT DETECTED Final   Haemophilus influenzae NOT DETECTED NOT DETECTED Final   Neisseria meningitidis NOT DETECTED NOT DETECTED Final   Pseudomonas aeruginosa NOT DETECTED NOT DETECTED Final  Stenotrophomonas maltophilia NOT DETECTED NOT DETECTED Final   Candida albicans NOT DETECTED NOT DETECTED Final   Candida auris NOT DETECTED NOT DETECTED Final   Candida glabrata NOT DETECTED NOT DETECTED Final   Candida krusei NOT DETECTED NOT DETECTED Final   Candida parapsilosis NOT DETECTED NOT DETECTED Final   Candida tropicalis NOT DETECTED NOT DETECTED Final   Cryptococcus neoformans/gattii NOT DETECTED NOT DETECTED Final   Meth resistant mecA/C and MREJ DETECTED (A) NOT DETECTED Final    Comment: CRITICAL RESULT CALLED TO, READ BACK BY AND VERIFIED WITH: T ALLEN,RN@0209  11/29/20 [place] Performed at [facility] [address] [place], [place] [address]   Culture, blood (Routine X 2) w Reflex to ID Panel     Status: None (Preliminary result)   Collection Time: 11/30/20  4:53 AM   Specimen: BLOOD RIGHT HAND  Result Value Ref Range Status   Specimen Description BLOOD RIGHT HAND  Final   Special Requests   Final    BOTTLES DRAWN AEROBIC AND ANAEROBIC Blood Culture adequate volume   Culture   Final    NO GROWTH < 12 HOURS Performed at [facility], [address], [place], [place] [address]    Report Status PENDING  Incomplete  Culture, blood (Routine X 2) w Reflex to ID Panel     Status: None (Preliminary result)   Collection Time: 11/30/20  4:53 AM   Specimen: BLOOD RIGHT ARM  Result Value Ref Range Status   Specimen Description BLOOD RIGHT ARM  Final   Special Requests   Final    BOTTLES DRAWN AEROBIC AND ANAEROBIC Blood Culture results may not be optimal due to an excessive  volume of blood received in culture bottles   Culture   Final    NO GROWTH < 12 HOURS Performed at [facility], [address], [place], [place] [address]    Report Status PENDING  Incomplete     Scheduled Meds:  calcitRIOL  0.5 mcg Oral Q M,W,F   calcium acetate  1,334 mg Oral TID with meals   Chlorhexidine Gluconate Cloth  6 each Topical Q0600   Chlorhexidine Gluconate Cloth  6 each Topical Q0600   Chlorhexidine Gluconate Cloth  6 each Topical Q0600   loratadine  10 mg Oral q1800   mouth rinse  15 mL Mouth Rinse BID   mupirocin ointment   Nasal BID   pantoprazole  40 mg Oral Daily   Continuous Infusions:  sodium chloride     sodium chloride     heparin 1,950 Units/hr (11/30/20 1442)   vancomycin 1,000 mg (11/29/20 1400)    Procedures/Studies: DG Elbow 2 Views Right  Result Date: 11/29/2020 CLINICAL DATA:  Pain and swelling in the right elbow lung recent fall, initial encounter EXAM: RIGHT ELBOW - 2 VIEW COMPARISON:  None. FINDINGS: Mild degenerative changes of the right elbow joint are seen. No acute fracture or dislocation is noted. Small joint effusion is noted anteriorly. Olecranon spurring is seen. IMPRESSION: Small joint effusion without acute fracture. Electronically Signed   By: Inez Catalina M.D.   On: 11/29/2020 18:58   DG Chest Port 1 View  Result Date: 11/29/2020 CLINICAL DATA:  Removal of Port-A-Cath EXAM: PORTABLE CHEST 1 VIEW COMPARISON:  11/27/2020 FINDINGS: There is been interval removal of hemodialysis catheter on the right. No visible retained fragments. Prior CABG. Cardiomegaly. Vascular congestion. Bilateral layering effusions and lower lobe airspace opacity. Probable mild edema/CHF. IMPRESSION: Interval removal of right hemodialysis catheter. CHF.  Layering  effusions. Electronically Signed   By: Rolm Baptise M.D.   On: 11/29/2020 16:58   DG Chest Port 1 View  Result Date: 11/27/2020 CLINICAL DATA:  Fall.  Chest and right foot pain. EXAM: PORTABLE CHEST 1  VIEW COMPARISON:  09/01/2020 FINDINGS: Unchanged mild cardiomegaly. Unchanged mild pulmonary vascular congestion. Small bilateral pleural effusions are not significantly changed since prior examination. Interval placement of dialysis catheter. Median sternotomy changes again noted. IMPRESSION: No acute abnormality of the chest. Unchanged findings of mild CHF/fluid volume overload. Electronically Signed   By: Miachel Roux M.D.   On: 11/27/2020 11:24   DG Foot 2 Views Right  Result Date: 11/27/2020 CLINICAL DATA:  wound to lateral fore foot EXAM: RIGHT FOOT - 2 VIEW COMPARISON:  Radiograph 08/18/2020 FINDINGS: There is no evidence of acute fracture or dislocation. Unchanged bulky ossification along the base of the fifth metatarsal, possibly from prior trauma. There is a lateral forefoot soft tissue defect. There is no frank bony destruction radiographically. Mild midfoot and forefoot degenerative changes. Plantar and dorsal calcaneal spurring. Vascular calcifications. IMPRESSION: Lateral forefoot soft tissue defect. No frank bony destruction radiographically to suggest osteomyelitis. MRI would be more sensitive. Electronically Signed   By: Maurine Simmering M.D.   On: 11/27/2020 11:32   Intravitreal Injection, Pharmacologic Agent - OS - Left Eye  Result Date: 11/01/2020 Time Out 11/01/2020. 2:39 PM. Confirmed correct patient, procedure, site, and patient consented. Anesthesia Topical anesthesia was used. Anesthetic medications included Ak[place] 3.5%. Procedure Preparation included Ofloxacin , 10% betadine to eyelids, 5% betadine to ocular surface. A 30 gauge needle was used. Injection: 2.5 mg bevacizumab 2.5 MG/0.1ML   Route: Intravitreal, Site: Left Eye   NDC: [phone], Lot: [id number] Post-op Post injection exam found visual acuity of at least counting fingers. The patient tolerated the procedure well. There were no complications. The patient received written and verbal post procedure care education. Post injection  medications were not given.   OCT, Retina - OU - Both Eyes  Result Date: 11/01/2020 Right Eye Quality was good. Scan locations included subfoveal. Central Foveal Thickness: 312. Progression has improved. Findings include vitreomacular adhesion , cystoid macular edema. Left Eye Quality was good. Scan locations included subfoveal. Central Foveal Thickness: 306. Progression has worsened. Findings include vitreomacular adhesion , cystoid macular edema. Notes Some recurrence of CSME OS still active.  At 68-month follow-up, will restart Avastin OS today CSME OD improved on intravitreal Avastin, OS postinjection also post focal laser.  With recurrence of CSME  ECHOCARDIOGRAM LIMITED  Result Date: 11/28/2020    ECHOCARDIOGRAM LIMITED REPORT   Patient Name:   [name] BELGER [facility] Date of Exam: 11/28/2020 Medical Rec #:  [id number]           Height:       74.0 in Accession #:    [id number]          Weight:       261.9 lb Date of Birth:  03-Dec-1950           BSA:          2.437 m Patient Age:    15 years            BP:           124/56 mmHg Patient Gender: M                   HR:           72 bpm. Exam Location:  [facility] Procedure:  Limited Echo, Color Doppler and Cardiac Doppler Indications:    Dyspnea R06.00  History:        Patient has prior history of Echocardiogram examinations, most                 recent 06/06/2020. CAD, Prior CABG, Arrythmias:Atrial                 Fibrillation, Signs/Symptoms:Shortness of Breath; Risk                 Factors:Hypertension, Diabetes and Dyslipidemia.  Sonographer:    Alvino Chapel RCS Referring Phys: [id number] [place]  1. Left ventricular ejection fraction, by estimation, is 45 to 50%. The left ventricle has mildly decreased function. The left ventricle demonstrates regional wall motion abnormalities (see scoring diagram/findings for description). There is severe asymmetric left ventricular hypertrophy of the basal and septal segments. Left ventricular  diastolic parameters are indeterminate. There is the interventricular septum is flattened in systole and diastole, consistent with right ventricular pressure and volume overload.  2. Right ventricular systolic function is low normal. The right ventricular size is mildly enlarged. There is moderately elevated pulmonary artery systolic pressure. The estimated right ventricular systolic pressure is 67.5 mmHg.  3. Left atrial size was mildly dilated.  4. Right atrial size was mildly dilated.  5. Left pleural effusion evident. A small pericardial effusion is present. The pericardial effusion is posterior to the left ventricle.  6. The mitral valve is abnormal. Mild mitral valve regurgitation.  7. The aortic valve is tricuspid. There is moderate calcification of the aortic valve. Aortic valve regurgitation is not visualized.  8. The inferior vena cava is dilated in size with <50% respiratory variability, suggesting right atrial pressure of 15 mmHg. Comparison(s): Prior images reviewed side by side. Reduced LVEF and wall motion abnormalities are not new based on my review of the prior study. FINDINGS  Left Ventricle: Left ventricular ejection fraction, by estimation, is 45 to 50%. The left ventricle has mildly decreased function. The left ventricle demonstrates regional wall motion abnormalities. Definity contrast agent was given IV to delineate the left ventricular endocardial borders. There is severe asymmetric left ventricular hypertrophy of the basal and septal segments. The interventricular septum is flattened in systole and diastole, consistent with right ventricular pressure and volume overload. Left ventricular diastolic parameters are indeterminate. Left ventricular diastolic function could not be evaluated due to atrial fibrillation.  LV Wall Scoring: The basal inferior segment is akinetic. The posterior wall, mid inferoseptal segment, mid inferior segment, and basal inferoseptal segment are hypokinetic. The  entire anterior wall, antero-lateral wall, entire anterior septum, and entire apex are normal. Right Ventricle: The right ventricular size is mildly enlarged. No increase in right ventricular wall thickness. Right ventricular systolic function is low normal. There is moderately elevated pulmonary artery systolic pressure. The tricuspid regurgitant  velocity is 3.12 m/s, and with an assumed right atrial pressure of 15 mmHg, the estimated right ventricular systolic pressure is 91.6 mmHg. Left Atrium: Left atrial size was mildly dilated. Right Atrium: Right atrial size was mildly dilated. Pericardium: Left pleural effusion evident. A small pericardial effusion is present. The pericardial effusion is posterior to the left ventricle. Mitral Valve: The mitral valve is abnormal. There is mild thickening of the mitral valve leaflet(s). There is mild calcification of the mitral valve leaflet(s). Mild mitral valve regurgitation. Tricuspid Valve: The tricuspid valve is grossly normal. Tricuspid valve regurgitation is mild. Aortic Valve: The aortic valve is tricuspid. There  is moderate calcification of the aortic valve. There is mild to moderate aortic valve annular calcification. Aortic valve regurgitation is not visualized. Aorta: The aortic root is normal in size and structure. Venous: The inferior vena cava is dilated in size with less than 50% respiratory variability, suggesting right atrial pressure of 15 mmHg. IAS/Shunts: No atrial level shunt detected by color flow Doppler. LEFT VENTRICLE PLAX 2D LVIDd:         5.50 cm LVIDs:         4.30 cm LV PW:         1.30 cm LV IVS:        1.80 cm LVOT diam:     2.30 cm LVOT Area:     4.15 cm  RIGHT VENTRICLE TAPSE (M-mode): 1.7 cm LEFT ATRIUM              Index        RIGHT ATRIUM           Index LA diam:        4.20 cm  1.72 cm/m   RA Area:     27.30 cm LA Vol (A2C):   102.0 ml 41.85 ml/m  RA Volume:   96.50 ml  39.59 ml/m LA Vol (A4C):   74.5 ml  30.57 ml/m LA Biplane  Vol: 91.8 ml  37.66 ml/m   AORTA Ao Root diam: 3.80 cm MITRAL VALVE                TRICUSPID VALVE MV Area (PHT): 5.02 cm     TR Peak grad:   38.9 mmHg MV Decel Time: 151 msec     TR Vmax:        312.00 cm/s MV E velocity: 143.00 cm/s                             SHUNTS                             Systemic Diam: 2.30 cm Rozann Lesches MD Electronically signed by Rozann Lesches MD Signature Date/Time: 11/28/2020/1:03:27 PM    Final     Orson Eva, DO  [facility]  If 7PM-7AM, please contact night-coverage [email or web address] Password [email or web address] 11/30/2020, 4:47 PM   LOS: 3 days

## 2020-11-30 NOTE — Progress Notes (Signed)
ANTICOAGULATION CONSULT NOTE - Initial Consult  Pharmacy Consult for Heparin Indication: hx DVT/afib  Allergies  Allergen Reactions   Latex Hives and Rash   Lipitor [Atorvastatin]     Leg aches     Patient Measurements: Height: 6\' 2"  (188 cm) Weight: 117.9 kg (259 lb 14.8 oz) IBW/kg (Calculated) : 82.2 HEPARIN DW (KG): 107.6   Vital Signs: Temp: 98.8 F (37.1 C) (10/20 1344) Temp Source: Oral (10/20 1344) BP: 126/62 (10/20 1344) Pulse Rate: 90 (10/20 1344)  Labs: Recent Labs    11/28/20 0449 11/29/20 0440 11/30/20 0453 11/30/20 1347  HGB 10.0* 9.4* 9.0*  --   HCT 32.4* 31.9* 29.7*  --   PLT 185 162 163  --   APTT  --   --  49* 42*  HEPARINUNFRC  --   --  >1.10*  --   CREATININE 4.38* 6.10* 5.11*  --      Estimated Creatinine Clearance: 18.6 mL/min (A) (by C-G formula based on SCr of 5.11 mg/dL (H)).   Medical History: Past Medical History:  Diagnosis Date   Anemia associated with chronic renal failure basically   Asthma    as a child   CHF (congestive heart failure) ([place])    Chronic kidney disease    Sees Dr [name]   Constipation    Coronary artery disease    COVID-19    Depression    Diabetes mellitus without complication ([place])    DVT (deep venous thrombosis) ([place])    History of blood clots    Hyperlipidemia    Hypertension    Leg pain    ABIs/LE Arterial US 03/2019: ABIs unreliable; +calcification, no evidence of stenosis   Myocardial infarction [facility])    Obesity    Pneumonia 2018   Pulmonary embolism ([facility])    Sleep apnea    CPAP   SOB (shortness of breath)    Swelling of both lower extremities     Medications:  See med rec  Assessment: 70 y.o. male with significant medical history as below, including ESRD on HD MWF. Patient with history of DVT/afib- restarted on Eliquis this admission but now holding in preparation for new catheter insertion.  aPTT: 49 > 42  Rate change not until 0930 and level ~5 hours after. No issues with heparin per  RN  Goal of Therapy:  APTT 66-102 sec Heparin level 0.3-0.7 units/ml Monitor platelets by anticoagulation protocol: Yes   Plan:  Increase heparin infusion to 1950 units/hr  Check aPTT/HL in 8 hours and daily while on heparin. Continue to monitor H&H and platelets.  Margot Ables, PharmD Clinical Pharmacist 11/30/2020 2:31 PM

## 2020-12-01 ENCOUNTER — Encounter (HOSPITAL_COMMUNITY): Payer: Self-pay | Admitting: Anesthesiology

## 2020-12-01 ENCOUNTER — Inpatient Hospital Stay (HOSPITAL_COMMUNITY): Payer: PPO

## 2020-12-01 DIAGNOSIS — N186 End stage renal disease: Secondary | ICD-10-CM

## 2020-12-01 DIAGNOSIS — B9562 Methicillin resistant Staphylococcus aureus infection as the cause of diseases classified elsewhere: Secondary | ICD-10-CM

## 2020-12-01 DIAGNOSIS — I25119 Atherosclerotic heart disease of native coronary artery with unspecified angina pectoris: Secondary | ICD-10-CM

## 2020-12-01 DIAGNOSIS — I255 Ischemic cardiomyopathy: Secondary | ICD-10-CM

## 2020-12-01 DIAGNOSIS — R1314 Dysphagia, pharyngoesophageal phase: Secondary | ICD-10-CM

## 2020-12-01 LAB — BLOOD CULTURE ID PANEL (REFLEXED) - BCID2

## 2020-12-01 LAB — CBC
HCT: 30.3 % — ABNORMAL LOW (ref 39.0–52.0)
Hemoglobin: 9.3 g/dL — ABNORMAL LOW (ref 13.0–17.0)
MCH: 27.8 pg (ref 26.0–34.0)
MCHC: 30.7 g/dL (ref 30.0–36.0)
MCV: 90.4 fL (ref 80.0–100.0)
Platelets: 166 10*3/uL (ref 150–400)
RBC: 3.35 MIL/uL — ABNORMAL LOW (ref 4.22–5.81)
RDW: 18.6 % — ABNORMAL HIGH (ref 11.5–15.5)
WBC: 14.1 10*3/uL — ABNORMAL HIGH (ref 4.0–10.5)
nRBC: 0 % (ref 0.0–0.2)

## 2020-12-01 LAB — RENAL FUNCTION PANEL
Albumin: 2.5 g/dL — ABNORMAL LOW (ref 3.5–5.0)
Anion gap: 13 (ref 5–15)
BUN: 69 mg/dL — ABNORMAL HIGH (ref 8–23)
CO2: 24 mmol/L (ref 22–32)
Calcium: 8.4 mg/dL — ABNORMAL LOW (ref 8.9–10.3)
Chloride: 93 mmol/L — ABNORMAL LOW (ref 98–111)
Creatinine, Ser: 6.35 mg/dL — ABNORMAL HIGH (ref 0.61–1.24)
GFR, Estimated: 9 mL/min — ABNORMAL LOW (ref >60)
Glucose, Bld: 162 mg/dL — ABNORMAL HIGH (ref 70–99)
Phosphorus: 3.9 mg/dL (ref 2.5–4.6)
Potassium: 4.3 mmol/L (ref 3.5–5.1)
Sodium: 130 mmol/L — ABNORMAL LOW (ref 135–145)

## 2020-12-01 LAB — HEPARIN LEVEL (UNFRACTIONATED): Heparin Unfractionated: >1.1 IU/mL — ABNORMAL HIGH (ref 0.30–0.70)

## 2020-12-01 LAB — APTT
aPTT: 30 seconds (ref 24–36)
aPTT: >200 seconds (ref 24–36)

## 2020-12-01 MED ORDER — HEPARIN (PORCINE) 25000 UT/250ML-% IV SOLN
1600.0000 [IU]/h | INTRAVENOUS | Status: AC
  Administered 2020-12-01 (×2): 1600 [IU]/h via INTRAVENOUS
  Filled 2020-12-01: qty 250

## 2020-12-01 MED ORDER — SODIUM CHLORIDE 0.9 % IV SOLN
2.0000 g | INTRAVENOUS | Status: DC
  Administered 2020-12-01 – 2020-12-04 (×2): 2 g via INTRAVENOUS
  Filled 2020-12-01 (×2): qty 2

## 2020-12-01 MED ORDER — ALBUMIN HUMAN 25 % IV SOLN
25.0000 g | Freq: Once | INTRAVENOUS | Status: AC
  Administered 2020-12-01: 25 g via INTRAVENOUS

## 2020-12-01 NOTE — Progress Notes (Signed)
PROGRESS NOTE  Travis Blair HRC:[id number] DOB: 04/11/1950 DOA: 11/27/2020 PCP: Travis Frizzle, MD    Brief History:   70 y.o. male with medical history significant of CHF, ERSD on HD (M-W-F), CAD, DVT/PE, and T2DM, CABG with post operative atrial fibrillation, pulmonary hypertension who presented with dyspnea.  Patient reported having cough for about 2 weeks, persistent and worsening.  Tried several antitussive agents at home without improvement of symptoms.  He went to dialysis on Friday, oct 14. He e was noted to be dyspneic by his wife, moderate in intensity, associated with cough.  He has had a chronic cough for at least one year, worse over last week prior to [place].  He took some robitussin and codeine from an old prescription on 11/26/20.  On Monday, he was more unsteady and fell in the BR.  He had subjective f/c at home since 11/24/20.  He denied n/v/d, abd pain.     His main symptom seems to be increased work of breathing/dyspnea, moderate to severe in intensity, worse with exertion, associated with cough, no chest pain, no improving factors. Last night not able to sleep due to persistent cough, dry.  He continues to make urine 3 times daily.   In June 2022 he decided to stop apixaban due to weakness, he was placed on clopidogrel. Last year he had COVID-pneumonia and pulm embolism. About 3 months ago he was started on hemodialysis, he has a fistula which is not mature yet.  He uses a tunneled dialysis catheter for dialysis.  Since admission, nephrology was consulted to assist.  He was dialyzed on 10/17 and 10/19.  He was found to have MRSA bacteremia.  General surgery was consulted to remove his Permacath.  He was started on IV vancomycin 11/29/20.   Assessment/Plan: Sepsis  -present on admission -had fever and leukocytosis -due to MRSA bacteremia -temps trending down   MRSA bacteremia -removed tunneled HD catheter 10/19 after HD -follow surveillance blood  cultures -temporary vascath planned 10/21 if cannot use fistula -consult cardiology for TEE  [facility] Bacteremia -start cefepime on HD -10/20 blood culture = EColi--no MRSA   Right Diabetic foot ulcer/Abscess/Acute Osteomyelitis 5th metatarsal -spouse states wound is improving with care at home -MR right foot--Soft tissue ulceration along the lateral forefoot near the fifth metatarsal head with underlying 2.3 cm curvilinear fluid collection,concerning for abscess -consult wound care -discussed with surgery--plan for possible bedside debridement   Acute respiratory failure with hypoxia -due to pulmonary edema -presented with oxygen saturation 87% on RA and tachypnea -stable on 2L -wean off oxygen for saturation >92%   Acute on chronic diastolic CHF/Acute systolic CHF -6/80/32 Echo 60-65%, no WMA; RV systolic function preserved.  RSVP 57.3.  No significant valvular disease. -fluid removal on HD -11/28/20 Echo EF 45-50%+WMA, RV overload   ESRD -appreciate renal consult -HD on 10/17, 10/19, 10/21 -10/21--able to use fistula   History DVT/PE -hold apixaban temporarily in preparation for new catheter insertion   Chronic Afib -AC as discussed above -continue IV heparin   Uncontrolled Diabetes mellitus type 2 with hyperglycemia -08/18/20 A1C--8.0 -CBGs controlled so far -novolog sliding scale -10/19- A1C 6.3   Right Elbow pain -due to recent fall on 11/27/20 -xray--no fracture; small anterior joint eff           Status is: Inpatient   Remains inpatient appropriate because: severity of illness  Family Communication:   spouse updated at bedside 10/20   Consultants:  renal, general surgery   Code Status:  FULL   DVT Prophylaxis:  IV Heparin      Procedures: As Listed in Progress Note Above   Antibiotics: Vanco 10/19>> Cefepime 10/21>>    Subjective: Patient denies fevers, chills, headache, chest pain, dyspnea, nausea, vomiting, diarrhea,  abdominal pain, dysuria, hematuria, hematochezia, and melena.   Objective: Vitals:   12/01/20 1500 12/01/20 1515 12/01/20 1530 12/01/20 1545  BP: (!) 90/57 122/63 111/61 117/62  Pulse: 82 78 65 72  Resp:    16  Temp:    98 F (36.7 C)  TempSrc:    Oral  SpO2:      Weight:      Height:        Intake/Output Summary (Last 24 hours) at 12/01/2020 1750 Last data filed at 12/01/2020 1652 Gross per 24 hour  Intake 585.33 ml  Output 3300 ml  Net -2714.67 ml   Weight change: 1.94 kg Exam:  General:  Pt is alert, follows commands appropriately, not in acute distress HEENT: No icterus, No thrush, No neck mass, [place]/AT Cardiovascular: RRR, S1/S2, no rubs, no gallops Respiratory: bibasilar crackles. No wheeze Abdomen: Soft/+BS, non tender, non distended, no guarding Extremities: trace LE edema, No lymphangitis, No petechiae, No rashes, no synovitis;; mild erythema with ulcer on right 5th met head without necrosis or pus   Data Reviewed: I have personally reviewed following labs and imaging studies Basic Metabolic Panel: Recent Labs  Lab 11/27/20 1711 11/28/20 0449 11/29/20 0440 11/30/20 0453 12/01/20 0538  NA 135 135 131* 133* 130*  K 3.8 3.9 4.1 3.9 4.3  CL 100 99 95* 97* 93*  CO2 _0 GLUCOSE 178* 126* 138* 120* 162*  BUN 74* 41* 60* 49* 69*  CREATININE 6.75* 4.38* 6.10* 5.11* 6.35*  CALCIUM 8.0* 8.1* 7.9* 8.0* 8.4*  PHOS 4.4  --   --  3.2 3.9   Liver Function Tests: Recent Labs  Lab 11/27/20 1711 11/30/20 0453 12/01/20 0538  ALBUMIN 2.6* 2.5* 2.5*   No results for input(s): LIPASE, AMYLASE in the last 168 hours. No results for input(s): AMMONIA in the last 168 hours. Coagulation Profile: No results for input(s): INR, PROTIME in the last 168 hours. CBC: Recent Labs  Lab 11/27/20 1711 11/28/20 0449 11/29/20 0440 11/30/20 0453 12/01/20 0538  WBC 15.8* 14.7* 15.0* 14.0* 14.1*  HGB 9.1* 10.0* 9.4* 9.0* 9.3*  HCT 30.1* 32.4* 31.9* 29.7* 30.3*   MCV 90.4 90.3 92.7 90.8 90.4  PLT 156 185 162 163 166   Cardiac Enzymes: No results for input(s): CKTOTAL, CKMB, CKMBINDEX, TROPONINI in the last 168 hours. BNP: Invalid input(s): POCBNP CBG: Recent Labs  Lab 11/27/20 2216 11/29/20 2009  GLUCAP 128* 130*   HbA1C: Recent Labs    11/30/20 0453  HGBA1C 6.3*   Urine analysis:    Component Value Date/Time   COLORURINE YELLOW 03/10/2017 1553   APPEARANCEUR HAZY (A) 03/10/2017 1553   LABSPEC 1.013 03/10/2017 1553   PHURINE 5.0 03/10/2017 1553   GLUCOSEU NEGATIVE 03/10/2017 1553   HGBUR NEGATIVE 03/10/2017 1553   BILIRUBINUR NEGATIVE 03/10/2017 1553   KETONESUR NEGATIVE 03/10/2017 1553   PROTEINUR 30 (A) 03/10/2017 1553   NITRITE NEGATIVE 03/10/2017 1553   LEUKOCYTESUR NEGATIVE 03/10/2017 1553   Sepsis Labs: _1 (procalcitonin:4,lacticidven:4) ) Recent Results (from the past 240 hour(s))  Resp Panel by RT-PCR (Flu A&B, Covid) Nasopharyngeal Swab  Status: None   Collection Time: 11/27/20 10:09 AM   Specimen: Nasopharyngeal Swab; Nasopharyngeal(NP) swabs in vial transport medium  Result Value Ref Range Status   SARS Coronavirus 2 by RT PCR NEGATIVE NEGATIVE Final    Comment: (NOTE) SARS-CoV-2 target nucleic acids are NOT DETECTED.  The SARS-CoV-2 RNA is generally detectable in upper respiratory specimens during the acute phase of infection. The lowest concentration of SARS-CoV-2 viral copies this assay can detect is 138 copies/mL. A negative result does not preclude SARS-Cov-2 infection and should not be used as the sole basis for treatment or other patient management decisions. A negative result may occur with  improper specimen collection/handling, submission of specimen other than nasopharyngeal swab, presence of viral mutation(s) within the areas targeted by this assay, and inadequate number of viral copies(<138 copies/mL). A negative result must be combined with clinical observations, patient history,  and epidemiological information. The expected result is Negative.  Fact Sheet for Patients:  EntrepreneurPulse.com.au  Fact Sheet for Healthcare Providers:  IncredibleEmployment.be  This test is no t yet approved or cleared by the [place] FDA and  has been authorized for detection and/or diagnosis of SARS-CoV-2 by FDA under an Emergency Use Authorization (EUA). This EUA will remain  in effect (meaning this test can be used) for the duration of the COVID-19 declaration under Section 564(b)(1) of the Act, 21 U.S.C.section 360bbb-3(b)(1), unless the authorization is terminated  or revoked sooner.       Influenza A by PCR NEGATIVE NEGATIVE Final   Influenza B by PCR NEGATIVE NEGATIVE Final    Comment: (NOTE) The Xpert Xpress SARS-CoV-2/FLU/RSV plus assay is intended as an aid in the diagnosis of influenza from Nasopharyngeal swab specimens and should not be used as a sole basis for treatment. Nasal washings and aspirates are unacceptable for Xpert Xpress SARS-CoV-2/FLU/RSV testing.  Fact Sheet for Patients: EntrepreneurPulse.com.au  Fact Sheet for Healthcare Providers: IncredibleEmployment.be  This test is not yet approved or cleared by the [place] FDA and has been authorized for detection and/or diagnosis of SARS-CoV-2 by FDA under an Emergency Use Authorization (EUA). This EUA will remain in effect (meaning this test can be used) for the duration of the COVID-19 declaration under Section 564(b)(1) of the Act, 21 U.S.C. section 360bbb-3(b)(1), unless the authorization is terminated or revoked.  Performed at [facility], [address], [place], [place] [address]   MRSA Next Gen by PCR, Nasal     Status: Abnormal   Collection Time: 11/27/20  4:10 PM   Specimen: Nasal Mucosa; Nasal Swab  Result Value Ref Range Status   MRSA by PCR Next Gen DETECTED (A) NOT DETECTED Final    Comment: RESULT  CALLED TO, READ BACK BY AND VERIFIED WITH: HEATHER TETREAULT,1951,11/27/2020,SELF S. (NOTE) The GeneXpert MRSA Assay (FDA approved for NASAL specimens only), is one component of a comprehensive MRSA colonization surveillance program. It is not intended to diagnose MRSA infection nor to guide or monitor treatment for MRSA infections. Test performance is not FDA approved in patients less than 58 years old. Performed at [facility], [address], [place], [place] [address]   Culture, blood (Routine X 2) w Reflex to ID Panel     Status: Abnormal (Preliminary result)   Collection Time: 11/28/20  9:34 AM   Specimen: BLOOD RIGHT HAND  Result Value Ref Range Status   Specimen Description   Final    BLOOD RIGHT HAND Performed at [facility], [address] [place], [place] [address]  Special Requests   Final    Immunocompromised Blood Culture adequate volume BOTTLES DRAWN AEROBIC AND ANAEROBIC Performed at [facility], [address], [place], [place] [address]    Culture  Setup Time   Final    GRAM POSITIVE COCCI IN BOTH AEROBIC AND ANAEROBIC BOTTLES Gram Stain Report Called to,Read Back By and Verified WithLynwood Dawley, 2216, 11/28/2020,SELF S aerobic bottle previously called 10.18.22 2353 matthews, b  Performed at [facility], [address] [place], [place] [address]    Culture (A)  Final    STAPHYLOCOCCUS AUREUS SUSCEPTIBILITIES PERFORMED ON PREVIOUS CULTURE WITHIN THE LAST 5 DAYS. Performed at [facility], [place] [address] [place], [place] [address]    Report Status PENDING  Incomplete  Culture, blood (Routine X 2) w Reflex to ID Panel     Status: Abnormal (Preliminary result)   Collection Time: 11/28/20  9:34 AM   Specimen: BLOOD RIGHT FOREARM  Result Value Ref Range Status   Specimen Description   Final    BLOOD RIGHT FOREARM Performed at [facility], [address] [place],  [address]    Special Requests   Final    Immunocompromised Blood Culture  adequate volume BOTTLES DRAWN AEROBIC AND ANAEROBIC Performed at [facility], [address] [place],  [address]    Culture  Setup Time   Final    GRAM POSITIVE COCCI IN BOTH AEROBIC AND ANAEROBIC BOTTLES Gram Stain Report Called to,Read Back By and Verified With: T.ALLEN,2236,11/28/2020,SELF S aerobic bottle [facility] previously called matthews, b 10.18.22 2355 CRITICAL RESULT CALLED TO, READ BACK BY AND VERIFIED WITH: T ALLEN,RN_0  11/29/20 [place]    Culture (A)  Final    METHICILLIN RESISTANT STAPHYLOCOCCUS AUREUS GRAM POSITIVE COCCI IDENTIFICATION TO FOLLOW Performed at [facility], [place] [address] [place], [place] [address]    Report Status PENDING  Incomplete   Organism ID, Bacteria METHICILLIN RESISTANT STAPHYLOCOCCUS AUREUS  Final      Susceptibility   Methicillin resistant staphylococcus aureus - MIC*    CIPROFLOXACIN >=8 RESISTANT Resistant     ERYTHROMYCIN >=8 RESISTANT Resistant     GENTAMICIN <=0.5 SENSITIVE Sensitive     OXACILLIN >=4 RESISTANT Resistant     TETRACYCLINE <=1 SENSITIVE Sensitive     VANCOMYCIN 1 SENSITIVE Sensitive     TRIMETH/SULFA <=10 SENSITIVE Sensitive     CLINDAMYCIN >=8 RESISTANT Resistant     RIFAMPIN <=0.5 SENSITIVE Sensitive     Inducible Clindamycin NEGATIVE Sensitive     * METHICILLIN RESISTANT STAPHYLOCOCCUS AUREUS  Blood Culture ID Panel (Reflexed)     Status: Abnormal   Collection Time: 11/28/20  9:34 AM  Result Value Ref Range Status   Enterococcus faecalis NOT DETECTED NOT DETECTED Final   Enterococcus Faecium NOT DETECTED NOT DETECTED Final   Listeria monocytogenes NOT DETECTED NOT DETECTED Final   Staphylococcus species DETECTED (A) NOT DETECTED Final    Comment: CRITICAL RESULT CALLED TO, READ BACK BY AND VERIFIED WITH: T ALLEN,RN_1  11/29/20 [place]    Staphylococcus aureus (BCID) DETECTED (A) NOT DETECTED Final    Comment: Methicillin (oxacillin)-resistant Staphylococcus aureus (MRSA). MRSA is predictably resistant to  beta-lactam antibiotics (except ceftaroline). Preferred therapy is vancomycin unless clinically contraindicated. Patient requires contact precautions if  hospitalized. CRITICAL RESULT CALLED TO, READ BACK BY AND VERIFIED WITH: T ALLEN,RN_2  11/29/20 [place]    Staphylococcus epidermidis NOT DETECTED NOT DETECTED Final   Staphylococcus lugdunensis NOT DETECTED NOT DETECTED Final   Streptococcus species DETECTED (A) NOT  DETECTED Final    Comment: Not Enterococcus species, Streptococcus agalactiae, Streptococcus pyogenes, or Streptococcus pneumoniae. CRITICAL RESULT CALLED TO, READ BACK BY AND VERIFIED WITH: T ALLEN,RN@ 0209 11/29/20 [place]    Streptococcus agalactiae NOT DETECTED NOT DETECTED Final   Streptococcus pneumoniae NOT DETECTED NOT DETECTED Final   Streptococcus pyogenes NOT DETECTED NOT DETECTED Final   A.calcoaceticus-baumannii NOT DETECTED NOT DETECTED Final   Bacteroides fragilis NOT DETECTED NOT DETECTED Final   Enterobacterales NOT DETECTED NOT DETECTED Final   Enterobacter cloacae complex NOT DETECTED NOT DETECTED Final   Escherichia coli NOT DETECTED NOT DETECTED Final   Klebsiella aerogenes NOT DETECTED NOT DETECTED Final   Klebsiella oxytoca NOT DETECTED NOT DETECTED Final   Klebsiella pneumoniae NOT DETECTED NOT DETECTED Final   Proteus species NOT DETECTED NOT DETECTED Final   Salmonella species NOT DETECTED NOT DETECTED Final   Serratia marcescens NOT DETECTED NOT DETECTED Final   Haemophilus influenzae NOT DETECTED NOT DETECTED Final   Neisseria meningitidis NOT DETECTED NOT DETECTED Final   Pseudomonas aeruginosa NOT DETECTED NOT DETECTED Final   Stenotrophomonas maltophilia NOT DETECTED NOT DETECTED Final   Candida albicans NOT DETECTED NOT DETECTED Final   Candida auris NOT DETECTED NOT DETECTED Final   Candida glabrata NOT DETECTED NOT DETECTED Final   Candida krusei NOT DETECTED NOT DETECTED Final   Candida parapsilosis NOT DETECTED NOT DETECTED Final   Candida  tropicalis NOT DETECTED NOT DETECTED Final   Cryptococcus neoformans/gattii NOT DETECTED NOT DETECTED Final   Meth resistant mecA/C and MREJ DETECTED (A) NOT DETECTED Final    Comment: CRITICAL RESULT CALLED TO, READ BACK BY AND VERIFIED WITH: T ALLEN,RN_0  11/29/20 [place] Performed at [facility], [address], [place], [place] [address]   Culture, blood (Routine X 2) w Reflex to ID Panel     Status: Abnormal (Preliminary result)   Collection Time: 11/30/20  4:53 AM   Specimen: BLOOD RIGHT HAND  Result Value Ref Range Status   Specimen Description   Final    BLOOD RIGHT HAND Performed at [facility], [address] [place], [place] [address]    Special Requests   Final    BOTTLES DRAWN AEROBIC AND ANAEROBIC Blood Culture adequate volume Performed at [facility], [address], [place], [place] [address]    Culture  Setup Time   Final    GRAM NEGATIVE RODS AEROBIC BOTTLE ONLY Gram Stain Report Called to,Read Back By and Verified With: EVANS T.,2003,11/30/2020,SELF S CRITICAL RESULT CALLED TO, READ BACK BY AND VERIFIED WITH: S,HURTH PHARMD _1  12/01/20 [place]    Culture (A)  Final    ESCHERICHIA COLI SUSCEPTIBILITIES TO FOLLOW Performed at [facility], [place] [address], [place], [place] [address]    Report Status PENDING  Incomplete  Culture, blood (Routine X 2) w Reflex to ID Panel     Status: None (Preliminary result)   Collection Time: 11/30/20  4:53 AM   Specimen: BLOOD RIGHT ARM  Result Value Ref Range Status   Specimen Description BLOOD RIGHT ARM  Final   Special Requests   Final    BOTTLES DRAWN AEROBIC AND ANAEROBIC Blood Culture results may not be optimal due to an excessive volume of blood received in culture bottles   Culture   Final    NO GROWTH 1 DAY Performed at [facility], [address] [place], [place] [address]    Report Status PENDING  Incomplete  Blood Culture ID Panel (Reflexed)  Status: Abnormal   Collection Time: 11/30/20  4:53 AM   Result Value Ref Range Status   Enterococcus faecalis NOT DETECTED NOT DETECTED Final   Enterococcus Faecium NOT DETECTED NOT DETECTED Final   Listeria monocytogenes NOT DETECTED NOT DETECTED Final   Staphylococcus species NOT DETECTED NOT DETECTED Final   Staphylococcus aureus (BCID) NOT DETECTED NOT DETECTED Final   Staphylococcus epidermidis NOT DETECTED NOT DETECTED Final   Staphylococcus lugdunensis NOT DETECTED NOT DETECTED Final   Streptococcus species NOT DETECTED NOT DETECTED Final   Streptococcus agalactiae NOT DETECTED NOT DETECTED Final   Streptococcus pneumoniae NOT DETECTED NOT DETECTED Final   Streptococcus pyogenes NOT DETECTED NOT DETECTED Final   A.calcoaceticus-baumannii NOT DETECTED NOT DETECTED Final   Bacteroides fragilis NOT DETECTED NOT DETECTED Final   Enterobacterales DETECTED (A) NOT DETECTED Final    Comment: Enterobacterales represent a large order of gram negative bacteria, not a single organism. CRITICAL RESULT CALLED TO, READ BACK BY AND VERIFIED WITH: S,HURTH PHARMD _0  12/01/20 [place]    Enterobacter cloacae complex NOT DETECTED NOT DETECTED Final   Escherichia coli DETECTED (A) NOT DETECTED Final    Comment: CRITICAL RESULT CALLED TO, READ BACK BY AND VERIFIED WITH: S,HURTH PHARMD _1  12/01/20 [place]    Klebsiella aerogenes NOT DETECTED NOT DETECTED Final   Klebsiella oxytoca NOT DETECTED NOT DETECTED Final   Klebsiella pneumoniae NOT DETECTED NOT DETECTED Final   Proteus species NOT DETECTED NOT DETECTED Final   Salmonella species NOT DETECTED NOT DETECTED Final   Serratia marcescens NOT DETECTED NOT DETECTED Final   Haemophilus influenzae NOT DETECTED NOT DETECTED Final   Neisseria meningitidis NOT DETECTED NOT DETECTED Final   Pseudomonas aeruginosa NOT DETECTED NOT DETECTED Final   Stenotrophomonas maltophilia NOT DETECTED NOT DETECTED Final   Candida albicans NOT DETECTED NOT DETECTED Final   Candida auris NOT DETECTED NOT DETECTED Final    Candida glabrata NOT DETECTED NOT DETECTED Final   Candida krusei NOT DETECTED NOT DETECTED Final   Candida parapsilosis NOT DETECTED NOT DETECTED Final   Candida tropicalis NOT DETECTED NOT DETECTED Final   Cryptococcus neoformans/gattii NOT DETECTED NOT DETECTED Final   CTX-M ESBL NOT DETECTED NOT DETECTED Final   Carbapenem resistance IMP NOT DETECTED NOT DETECTED Final   Carbapenem resistance KPC NOT DETECTED NOT DETECTED Final   Carbapenem resistance NDM NOT DETECTED NOT DETECTED Final   Carbapenem resist OXA 48 LIKE NOT DETECTED NOT DETECTED Final   Carbapenem resistance VIM NOT DETECTED NOT DETECTED Final    Comment: Performed at [facility], [address], [place], [place] [address]  Culture, blood (Routine X 2) w Reflex to ID Panel     Status: None (Preliminary result)   Collection Time: 12/01/20 12:46 PM   Specimen: BLOOD  Result Value Ref Range Status   Specimen Description BLOOD DRAWN BY DIALYSIS DRAWN BY RN  Final   Special Requests   Final    BOTTLES DRAWN AEROBIC AND ANAEROBIC Blood Culture adequate volume Performed at [facility], [address], [place], [place] [address]    Culture PENDING  Incomplete   Report Status PENDING  Incomplete  Culture, blood (Routine X 2) w Reflex to ID Panel     Status: None (Preliminary result)   Collection Time: 12/01/20 12:55 PM   Specimen: BLOOD  Result Value Ref Range Status   Specimen Description BLOOD DRAWN BY DIALYSIS DRAWN BY RN  Final   Special Requests   Final    BOTTLES DRAWN  AEROBIC AND ANAEROBIC Blood Culture adequate volume Performed at [facility], [address], [place], [place] [address]    Culture PENDING  Incomplete   Report Status PENDING  Incomplete     Scheduled Meds:  calcitRIOL  0.5 mcg Oral Q M,W,F   calcium acetate  1,334 mg Oral TID with meals   Chlorhexidine Gluconate Cloth  6 each Topical Q0600   Chlorhexidine Gluconate Cloth  6 each Topical Q0600   Chlorhexidine Gluconate Cloth  6 each  Topical Q0600   loratadine  10 mg Oral q1800   mouth rinse  15 mL Mouth Rinse BID   mupirocin ointment   Nasal BID   pantoprazole  40 mg Oral Daily   Continuous Infusions:  ceFEPime (MAXIPIME) IV Stopped (12/01/20 1559)   heparin 1,600 Units/hr (12/01/20 1640)   vancomycin Stopped (12/01/20 1505)    Procedures/Studies: DG Elbow 2 Views Right  Result Date: 11/29/2020 CLINICAL DATA:  Pain and swelling in the right elbow lung recent fall, initial encounter EXAM: RIGHT ELBOW - 2 VIEW COMPARISON:  None. FINDINGS: Mild degenerative changes of the right elbow joint are seen. No acute fracture or dislocation is noted. Small joint effusion is noted anteriorly. Olecranon spurring is seen. IMPRESSION: Small joint effusion without acute fracture. Electronically Signed   By: Inez Catalina M.D.   On: 11/29/2020 18:58   MR FOOT RIGHT WO CONTRAST  Result Date: 12/01/2020 CLINICAL DATA:  Right foot ulcer. EXAM: MRI OF THE RIGHT FOREFOOT WITHOUT CONTRAST TECHNIQUE: Multiplanar, multisequence MR imaging of the right forefoot was performed. No intravenous contrast was administered. COMPARISON:  Right foot x-rays dated November 27, 2020. FINDINGS: Bones/Joint/Cartilage Faint marrow edema the fifth metatarsal head. Remaining marrow signal is normal. No fracture or dislocation. Mild second TMT joint osteoarthritis. No joint effusion. Ligaments Collateral ligaments are intact.  Lisfranc ligament is intact. Muscles and Tendons Flexor and extensor tendons are intact. Increased T2 signal within the intrinsic muscles of the forefoot, nonspecific, but likely related to diabetic muscle changes. Soft tissue Diffuse soft tissue swelling. Soft tissue ulceration along the lateral forefoot near the fifth metatarsal head with underlying 2.1 x 0.4 x 2.3 cm curvilinear fluid collection (series 8, image 26). No soft tissue mass. IMPRESSION: 1. Soft tissue ulceration along the lateral forefoot near the fifth metatarsal head with  underlying 2.3 cm curvilinear fluid collection, concerning for abscess. 2. Faint marrow edema in the fifth metatarsal head is nonspecific, but concerning for early osteomyelitis given adjacent abscess and soft tissue ulcer. Electronically Signed   By: Titus Dubin M.D.   On: 12/01/2020 14:16   DG Chest Port 1 View  Result Date: 11/29/2020 CLINICAL DATA:  Removal of Port-A-Cath EXAM: PORTABLE CHEST 1 VIEW COMPARISON:  11/27/2020 FINDINGS: There is been interval removal of hemodialysis catheter on the right. No visible retained fragments. Prior CABG. Cardiomegaly. Vascular congestion. Bilateral layering effusions and lower lobe airspace opacity. Probable mild edema/CHF. IMPRESSION: Interval removal of right hemodialysis catheter. CHF.  Layering effusions. Electronically Signed   By: Rolm Baptise M.D.   On: 11/29/2020 16:58   DG Chest Port 1 View  Result Date: 11/27/2020 CLINICAL DATA:  Fall.  Chest and right foot pain. EXAM: PORTABLE CHEST 1 VIEW COMPARISON:  09/01/2020 FINDINGS: Unchanged mild cardiomegaly. Unchanged mild pulmonary vascular congestion. Small bilateral pleural effusions are not significantly changed since prior examination. Interval placement of dialysis catheter. Median sternotomy changes again noted. IMPRESSION: No acute abnormality of the chest. Unchanged findings of mild CHF/fluid volume overload. Electronically  Signed   By: Miachel Roux M.D.   On: 11/27/2020 11:24   DG Foot 2 Views Right  Result Date: 11/27/2020 CLINICAL DATA:  wound to lateral fore foot EXAM: RIGHT FOOT - 2 VIEW COMPARISON:  Radiograph 08/18/2020 FINDINGS: There is no evidence of acute fracture or dislocation. Unchanged bulky ossification along the base of the fifth metatarsal, possibly from prior trauma. There is a lateral forefoot soft tissue defect. There is no frank bony destruction radiographically. Mild midfoot and forefoot degenerative changes. Plantar and dorsal calcaneal spurring. Vascular  calcifications. IMPRESSION: Lateral forefoot soft tissue defect. No frank bony destruction radiographically to suggest osteomyelitis. MRI would be more sensitive. Electronically Signed   By: Maurine Simmering M.D.   On: 11/27/2020 11:32   ECHOCARDIOGRAM LIMITED  Result Date: 11/28/2020    ECHOCARDIOGRAM LIMITED REPORT   Patient Name:   [facility] Date of Exam: 11/28/2020 Medical Rec #:  [id number]           Height:       74.0 in Accession #:    [id number]          Weight:       261.9 lb Date of Birth:  02-07-1951           BSA:          2.437 m Patient Age:    66 years            BP:           124/56 mmHg Patient Gender: M                   HR:           72 bpm. Exam Location:  [facility] Procedure: Limited Echo, Color Doppler and Cardiac Doppler Indications:    Dyspnea R06.00  History:        Patient has prior history of Echocardiogram examinations, most                 recent 06/06/2020. CAD, Prior CABG, Arrythmias:Atrial                 Fibrillation, Signs/Symptoms:Shortness of Breath; Risk                 Factors:Hypertension, Diabetes and Dyslipidemia.  Sonographer:    Alvino Chapel RCS Referring Phys: [id number] [place]  1. Left ventricular ejection fraction, by estimation, is 45 to 50%. The left ventricle has mildly decreased function. The left ventricle demonstrates regional wall motion abnormalities (see scoring diagram/findings for description). There is severe asymmetric left ventricular hypertrophy of the basal and septal segments. Left ventricular diastolic parameters are indeterminate. There is the interventricular septum is flattened in systole and diastole, consistent with right ventricular pressure and volume overload.  2. Right ventricular systolic function is low normal. The right ventricular size is mildly enlarged. There is moderately elevated pulmonary artery systolic pressure. The estimated right ventricular systolic pressure is 17.6 mmHg.  3. Left atrial size was  mildly dilated.  4. Right atrial size was mildly dilated.  5. Left pleural effusion evident. A small pericardial effusion is present. The pericardial effusion is posterior to the left ventricle.  6. The mitral valve is abnormal. Mild mitral valve regurgitation.  7. The aortic valve is tricuspid. There is moderate calcification of the aortic valve. Aortic valve regurgitation is not visualized.  8. The inferior vena cava is dilated in size with <50% respiratory variability, suggesting right atrial pressure  of 15 mmHg. Comparison(s): Prior images reviewed side by side. Reduced LVEF and wall motion abnormalities are not new based on my review of the prior study. FINDINGS  Left Ventricle: Left ventricular ejection fraction, by estimation, is 45 to 50%. The left ventricle has mildly decreased function. The left ventricle demonstrates regional wall motion abnormalities. Definity contrast agent was given IV to delineate the left ventricular endocardial borders. There is severe asymmetric left ventricular hypertrophy of the basal and septal segments. The interventricular septum is flattened in systole and diastole, consistent with right ventricular pressure and volume overload. Left ventricular diastolic parameters are indeterminate. Left ventricular diastolic function could not be evaluated due to atrial fibrillation.  LV Wall Scoring: The basal inferior segment is akinetic. The posterior wall, mid inferoseptal segment, mid inferior segment, and basal inferoseptal segment are hypokinetic. The entire anterior wall, antero-lateral wall, entire anterior septum, and entire apex are normal. Right Ventricle: The right ventricular size is mildly enlarged. No increase in right ventricular wall thickness. Right ventricular systolic function is low normal. There is moderately elevated pulmonary artery systolic pressure. The tricuspid regurgitant  velocity is 3.12 m/s, and with an assumed right atrial pressure of 15 mmHg, the  estimated right ventricular systolic pressure is 74.9 mmHg. Left Atrium: Left atrial size was mildly dilated. Right Atrium: Right atrial size was mildly dilated. Pericardium: Left pleural effusion evident. A small pericardial effusion is present. The pericardial effusion is posterior to the left ventricle. Mitral Valve: The mitral valve is abnormal. There is mild thickening of the mitral valve leaflet(s). There is mild calcification of the mitral valve leaflet(s). Mild mitral valve regurgitation. Tricuspid Valve: The tricuspid valve is grossly normal. Tricuspid valve regurgitation is mild. Aortic Valve: The aortic valve is tricuspid. There is moderate calcification of the aortic valve. There is mild to moderate aortic valve annular calcification. Aortic valve regurgitation is not visualized. Aorta: The aortic root is normal in size and structure. Venous: The inferior vena cava is dilated in size with less than 50% respiratory variability, suggesting right atrial pressure of 15 mmHg. IAS/Shunts: No atrial level shunt detected by color flow Doppler. LEFT VENTRICLE PLAX 2D LVIDd:         5.50 cm LVIDs:         4.30 cm LV PW:         1.30 cm LV IVS:        1.80 cm LVOT diam:     2.30 cm LVOT Area:     4.15 cm  RIGHT VENTRICLE TAPSE (M-mode): 1.7 cm LEFT ATRIUM              Index        RIGHT ATRIUM           Index LA diam:        4.20 cm  1.72 cm/m   RA Area:     27.30 cm LA Vol (A2C):   102.0 ml 41.85 ml/m  RA Volume:   96.50 ml  39.59 ml/m LA Vol (A4C):   74.5 ml  30.57 ml/m LA Biplane Vol: 91.8 ml  37.66 ml/m   AORTA Ao Root diam: 3.80 cm MITRAL VALVE                TRICUSPID VALVE MV Area (PHT): 5.02 cm     TR Peak grad:   38.9 mmHg MV Decel Time: 151 msec     TR Vmax:        312.00 cm/s MV E velocity:  143.00 cm/s                             SHUNTS                             Systemic Diam: 2.30 cm Rozann Lesches MD Electronically signed by Rozann Lesches MD Signature Date/Time: 11/28/2020/1:03:27 PM     Final     Orson Eva, DO  [facility]  If 7PM-7AM, please contact night-coverage [email or web address] Password [email or web address] 12/01/2020, 5:50 PM   LOS: 4 days

## 2020-12-01 NOTE — Consult Note (Addendum)
Cardiology Consultation:   Patient ID: Travis Blair MRN: [id number]; DOB: Feb 02, 1951  Admit date: 11/27/2020 Date of Consult: 12/01/2020  PCP:  [name], MD   [facility] Providers Cardiologist:  [name], MD  AHF: Dr. Haroldine Laws  Patient Profile:   Travis Blair is a 71 y.o. male with a hx of CAD (s/p CABG in 02/2017 with LIMA-LAD, SVG-PDA and SVG-OM), HFpEF, Pulmonary HTN, paroxysmal atrial fibrillation, HTN, HLD, Type 2 DM, OSA, history of recurrent DVT's and prior PE (previously on Eliquis) and ESRD who is being seen 12/01/2020 for the evaluation of bacteremia and need for TEE along with new cardiomyopathy at the request of Dr. Carles Collet.  History of Present Illness:   Mr. Poke was last examined by Richardson Dopp, PA-C in 10/2020 and denied any recent anginal symptoms at that time. Was off Eliquis given his worsening anemia and heme-positive stools. Given no plans to resume anticoagulation due to his high-bleeding risk, the option of starting Plavix was reviewed with Dr. Acie Fredrickson and this was initiated on 11/06/2020.  He presented to [facility] ED on 11/27/2020 after suffering a fall at home and also reporting a productive cough and worsening dyspnea on exertion. He was hypoxic with saturations in the 70's on RA and CXR showed cardiomegaly and vascular congestion along with bilateral pleural effusions. WBC at 15.8 and Hgb 9.1 (Previously 13.6 one month prior). Hs Troponin values were elevated to 312 and 485 which were felt to be secondary to demand ischemia in the setting of acute hypoxic respiratory failure. EKG showed rate-controlled atrial fibrillation, HR 95 with LPFB. Blood cultures were also obtained and are positive for MRSA and strep species. Was admitted for further management of sepsis and started on broad-spectrum antibiotics (currently on Vancomycin). An echo was obtained and shows his EF is mildly reduced at 45-50% with the basal inferior segment being  akinetic. Did have severe asymmetric LVH and septum is flattened in systole and diastole, consistent with volume overload. RV function low-normal and PASP moderately elevated at 53.9 mmHg. Also noted to have a small pericardial effusion and mild MR.   His HD catheter was removed on 10/19 with possible new TDC by Dr. Constance Haw later this admission given his bacteremia. Also diagnosed with an ulcer along his food and Wound Care is following.   In talking with the patient and his wife today, she reports he has been experiencing significant fatigue over the past several weeks with worsening dyspnea as well. No reported chest pain or palpitations. He does have baseline orthopnea and intermittent lower extremity edema. Nephrology has been following and is planning for dialysis later today. He also has a pending MRI for assessment of his right foot wound. We discussed the possible TEE as requested by the admitting team and his wife did bring up several concerns as she reports he has been experiencing intermittent dysphagia for several weeks with liquids and solid foods. She also mentions he had amnesia following a procedure on 10/09/2020 which lasted for over 3 days and she is unsure which medications he received during that timeframe.   Past Medical History:  Diagnosis Date   Anemia associated with chronic renal failure basically   Asthma    as a child   CHF (congestive heart failure) ([place])    Chronic kidney disease    Sees Dr [name]   Constipation    Coronary artery disease    COVID-19    Depression    Diabetes mellitus without complication ([place])  DVT (deep venous thrombosis) (HCC)    History of blood clots    Hyperlipidemia    Hypertension    Leg pain    ABIs/LE Arterial US 03/2019: ABIs unreliable; +calcification, no evidence of stenosis   Myocardial infarction [facility])    Obesity    Pneumonia 2018   Pulmonary embolism (HCC)    Sleep apnea    CPAP   SOB (shortness of breath)    Swelling of  both lower extremities     Past Surgical History:  Procedure Laterality Date   A/V FISTULAGRAM Left 09/29/2020   Procedure: A/V FISTULAGRAM;  Surgeon: Angelia Mould, MD;  Location: [place] [facility];  Service: Cardiovascular;  Laterality: Left;   AV FISTULA PLACEMENT Left 05/04/2020   Procedure: LEFT ARM ARTERIOVENOUS (AV) FISTULA CREATION;  Surgeon: Rosetta Posner, MD;  Location: [facility];  Service: Vascular;  Laterality: Left;   COLONOSCOPY     CORONARY ARTERY BYPASS GRAFT N/A 03/07/2017   Procedure: CORONARY ARTERY BYPASS GRAFTING (CABG), X 3 , USING RIGHT INTERNAL MAMMARY ARTERY, AND RIGHT LEG GREATER SAPHENOUS VEIN HARVESTED ENDOSCOPICALLY;  Surgeon: [name], MD;  Location: [place];  Service: Open Heart Surgery;  Laterality: N/A;   FISTULA SUPERFICIALIZATION Left 10/09/2020   Procedure: FISTULA SUPERFICIALIZATION;  Surgeon: Angelia Mould, MD;  Location: [facility] OR;  Service: Vascular;  Laterality: Left;   HAND SURGERY Right 1991   IR FLUORO GUIDE CV LINE RIGHT  09/04/2020   IR US GUIDE VASC ACCESS RIGHT  09/04/2020   LIGATION OF COMPETING BRANCHES OF ARTERIOVENOUS FISTULA Left 10/09/2020   Procedure: LIGATION OF 2 LARGE COMPETING BRANCHES OF LEFT BRACHIOCEPHALIC FISTULA;  Surgeon: Angelia Mould, MD;  Location: [place];  Service: Vascular;  Laterality: Left;   RIGHT HEART CATH N/A 07/07/2020   Procedure: RIGHT HEART CATH;  Surgeon: Jolaine Artist, MD;  Location: [place] [facility];  Service: Cardiovascular;  Laterality: N/A;   RIGHT/LEFT HEART CATH AND CORONARY ANGIOGRAPHY N/A 12/20/2016   Procedure: RIGHT/LEFT HEART CATH AND CORONARY ANGIOGRAPHY;  Surgeon: [name], MD;  Location: [place] [facility];  Service: Cardiovascular;  Laterality: N/A;   TEE WITHOUT CARDIOVERSION N/A 03/07/2017   Procedure: TRANSESOPHAGEAL ECHOCARDIOGRAM (TEE);  Surgeon: [name], [name], MD;  Location: [place];  Service: Open Heart Surgery;  Laterality: N/A;   TONSILLECTOMY        Home Medications:  Prior to Admission medications   Medication Sig Start Date End Date Taking? Authorizing Provider  albuterol (VENTOLIN HFA) 108 (90 Base) MCG/ACT inhaler Inhale 1-2 puffs into the lungs every 6 (six) hours as needed for wheezing or shortness of breath. 07/04/20  Yes [name], MD  calcium acetate (PHOSLO) 667 MG capsule Take 1,334 mg by mouth 3 (three) times daily. 09/19/20  Yes [provider]  clopidogrel (PLAVIX) 75 MG tablet Take 1 tablet (75 mg total) by mouth daily. 11/09/20  Yes Nahser, Wonda Cheng, MD  ferrous sulfate (FERROUSUL) 325 (65 FE) MG tablet Take 1 tablet (325 mg total) by mouth daily with breakfast. 01/28/20  Yes [name], MD  fluticasone (FLONASE) 50 MCG/ACT nasal spray Place 2 sprays into both nostrils daily. Patient taking differently: Place 2 sprays into both nostrils daily as needed for allergies. 05/01/20  Yes [name], MD  levocetirizine (XYZAL) 5 MG tablet Take 5 mg by mouth every evening.   Yes [provider]  pantoprazole (PROTONIX) 40 MG tablet Take 1 tablet (40 mg total)  by mouth daily. 08/18/20  Yes [name], MD  Evolocumab (REPATHA SURECLICK) 914 MG/ML SOAJ Inject 1 pen into the skin every 14 (fourteen) days. Patient not taking: Reported on 11/27/2020 06/23/20   Nahser, Wonda Cheng, MD  fluorouracil (EFUDEX) 5 % cream Apply topically 2 (two) times daily. Patient not taking: Reported on 11/27/2020 10/11/20   [provider]    Inpatient Medications: Scheduled Meds:  calcitRIOL  0.5 mcg Oral Q M,W,F   calcium acetate  1,334 mg Oral TID with meals   Chlorhexidine Gluconate Cloth  6 each Topical Q0600   Chlorhexidine Gluconate Cloth  6 each Topical Q0600   Chlorhexidine Gluconate Cloth  6 each Topical Q0600   loratadine  10 mg Oral q1800   mouth rinse  15 mL Mouth Rinse BID   mupirocin ointment   Nasal BID   pantoprazole  40 mg Oral Daily   Continuous Infusions:  sodium chloride      sodium chloride     heparin Stopped (12/01/20 1007)   vancomycin 1,000 mg (11/29/20 1400)   PRN Meds: sodium chloride, sodium chloride, acetaminophen **OR** acetaminophen, albuterol, alteplase, fluticasone, heparin, heparin, lidocaine (PF), lidocaine-prilocaine, ondansetron **OR** ondansetron (ZOFRAN) IV, pentafluoroprop-tetrafluoroeth  Allergies:    Allergies  Allergen Reactions   Latex Hives and Rash   Lipitor [Atorvastatin]     Leg aches     Social History:   Social History   Socioeconomic History   Marital status: Married    Spouse name: Not on file   Number of children: 2   Years of education: Not on file   Highest education level: Not on file  Occupational History   Occupation: general contractor-retired  Tobacco Use   Smoking status: Never   Smokeless tobacco: Never  Vaping Use   Vaping Use: Never used  Substance and Sexual Activity   Alcohol use: No   Drug use: No   Sexual activity: Never  Other Topics Concern   Not on file  Social History Narrative   Not on file   Social Determinants of Health   Financial Resource Strain: Not on file  Food Insecurity: Not on file  Transportation Needs: Not on file  Physical Activity: Not on file  Stress: Not on file  Social Connections: Not on file  Intimate Partner Violence: Not on file    Family History:    Family History  Problem Relation Age of Onset   Heart disease Mother    Diabetes Mother    Multiple myeloma Mother    Cancer Father        type unknown, as a child   Heart disease Father    Hypertension Father    Congenital heart disease Father    Hyperlipidemia Father    Lymphoma Sister      ROS:  Please see the history of present illness.   All other ROS reviewed and negative.     Physical Exam/Data:   Vitals:   12/01/20 0300 12/01/20 0348 12/01/20 0529 12/01/20 0600  BP:  (!) 129/58    Pulse:  79 88   Resp:  16    Temp:  98.1 F (36.7 C)    TempSrc:  Oral    SpO2:  (!) 76% 97%    Weight: 119.8 kg   119.9 kg  Height:        Intake/Output Summary (Last 24 hours) at 12/01/2020 1034 Last data filed at 12/01/2020 0600 Gross per 24 hour  Intake 614.72 ml  Output --  Net 614.72 ml   Last 3 Weights 12/01/2020 12/01/2020 11/29/2020  Weight (lbs) 264 lb 5.3 oz 264 lb 3.2 oz 259 lb 14.8 oz  Weight (kg) 119.9 kg 119.84 kg 117.9 kg     Body mass index is 33.94 kg/m.  General:  Pleasant male appearing in no acute distress.  HEENT: normal Neck: no JVD Vascular: No carotid bruits; Distal pulses 2+ bilaterally Cardiac:  normal S1, S2; Irregularly irregular Lungs:  clear to auscultation bilaterally, no wheezing, rhonchi or rales  Abd: soft, nontender, no hepatomegaly  Ext: Trace lower extremity edema. SCD's in place.  Musculoskeletal:  No deformities, BUE and BLE strength normal and equal Skin: warm and dry  Neuro:  CNs 2-12 intact, no focal abnormalities noted Psych:  Normal affect   EKG:  The EKG was personally reviewed and demonstrates: Rate-controlled atrial fibrillation, HR 95 with LPFB. Telemetry:  Telemetry was personally reviewed and demonstrates:  Atrial fibrillation, HR in 80's to 90's. Occasional PVC's and couplets.    Relevant CV Studies:  Echocardiogram: 05/2020 IMPRESSIONS     1. Left ventricular ejection fraction, by estimation, is 60 to 65%. The  left ventricle has normal function. The left ventricle has no regional  wall motion abnormalities. There is mild concentric left ventricular  hypertrophy and severe basal septal  hypertrophy.   2. Right ventricular systolic function is normal. The right ventricular  size is mildly enlarged. There is moderately elevated pulmonary artery  systolic pressure. The estimated right ventricular systolic pressure is  03.0 mmHg.   3. The mitral valve is normal in structure. Mild mitral valve  regurgitation. No evidence of mitral stenosis.   4. The aortic valve is normal in structure. Aortic valve  regurgitation is  not visualized. No aortic stenosis is present.   5. The inferior vena cava is normal in size with <50% respiratory  variability, suggesting right atrial pressure of 8 mmHg.   6. Aortic dilatation noted. There is mild dilatation of the aortic root,  measuring 40 mm. There is mild dilatation of the ascending aorta,  measuring 39 mm.   7. Right atrial size was moderately dilated.   8. Left atrial size was mildly dilated.   9. A small to mdoerate pericardial effusion is present. The pericardial  effusion is circumferential but more prominent posteriorly.   RHC: 06/2020 RA = 12 RV = 72/11 PA = 73/21 (41) PCW = 27 Fick cardiac output/index = 7.1/2.8 Thermo = 8.2/3.2 PVR = 2.0 (fick) 1.7 (Thermo) Ao sat = 99% PA sat = 63%, 63%   Assessment: 1. Moderate pulmonary HTN due primarily to fluid overload (pulmonary venous HTN) and high output state from AVF. ([place] not amenable to pulmonary vasodilators)   Plan/Discussion:   I discussed with Dr. [name] by phone. We will give him lasix 80 IV here in cath lab and metolazone $RemoveBefore'5mg'[id number]$  x 2 days + double torsemide to 40 bid. He will check labs with PCP next week and f/u with Dr. [name] in 2 weeks. If not diuresing will contact Dr. [name].    Limited Echocardiogram: 11/28/2020 IMPRESSIONS     1. Left ventricular ejection fraction, by estimation, is 45 to 50%. The  left ventricle has mildly decreased function. The left ventricle  demonstrates regional wall motion abnormalities (see scoring  diagram/findings for description). There is severe  asymmetric left ventricular hypertrophy of the basal and septal segments.  Left ventricular diastolic parameters are indeterminate. There is the  interventricular septum is flattened in systole and diastole,  consistent  with right ventricular pressure and  volume overload.   2. Right ventricular systolic function is low normal. The right  ventricular size is mildly enlarged. There is moderately  elevated  pulmonary artery systolic pressure. The estimated right ventricular  systolic pressure is 21.1 mmHg.   3. Left atrial size was mildly dilated.   4. Right atrial size was mildly dilated.   5. Left pleural effusion evident. A small pericardial effusion is  present. The pericardial effusion is posterior to the left ventricle.   6. The mitral valve is abnormal. Mild mitral valve regurgitation.   7. The aortic valve is tricuspid. There is moderate calcification of the  aortic valve. Aortic valve regurgitation is not visualized.   8. The inferior vena cava is dilated in size with <50% respiratory  variability, suggesting right atrial pressure of 15 mmHg.   Laboratory Data:  High Sensitivity Troponin:   Recent Labs  Lab 11/27/20 1008 11/27/20 1150  TROPONINIHS 312* 485*     Chemistry Recent Labs  Lab 11/29/20 0440 11/30/20 0453 12/01/20 0538  NA 131* 133* 130*  K 4.1 3.9 4.3  CL 95* 97* 93*  CO2 $Re'26 25 24  'yDr$ GLUCOSE 138* 120* 162*  BUN 60* 49* 69*  CREATININE 6.10* 5.11* 6.35*  CALCIUM 7.9* 8.0* 8.4*  GFRNONAA 9* 12* 9*  ANIONGAP $RemoveB'10 11 13    'lhtcxxur$ Recent Labs  Lab 11/27/20 1711 11/30/20 0453 12/01/20 0538  ALBUMIN 2.6* 2.5* 2.5*   Lipids No results for input(s): CHOL, TRIG, HDL, LABVLDL, LDLCALC, CHOLHDL in the last 168 hours.  Hematology Recent Labs  Lab 11/29/20 0440 11/30/20 0453 12/01/20 0538  WBC 15.0* 14.0* 14.1*  RBC 3.44* 3.27* 3.35*  HGB 9.4* 9.0* 9.3*  HCT 31.9* 29.7* 30.3*  MCV 92.7 90.8 90.4  MCH 27.3 27.5 27.8  MCHC 29.5* 30.3 30.7  RDW 18.6* 18.4* 18.6*  PLT 162 163 166   Thyroid No results for input(s): TSH, FREET4 in the last 168 hours.  BNP Recent Labs  Lab 11/27/20 1008  BNP 2,754.0*    DDimer  Recent Labs  Lab 11/27/20 1008  DDIMER 4.65*     Radiology/Studies:  DG Elbow 2 Views Right  Result Date: 11/29/2020 CLINICAL DATA:  Pain and swelling in the right elbow lung recent fall, initial encounter EXAM: RIGHT ELBOW - 2 VIEW  COMPARISON:  None. FINDINGS: Mild degenerative changes of the right elbow joint are seen. No acute fracture or dislocation is noted. Small joint effusion is noted anteriorly. Olecranon spurring is seen. IMPRESSION: Small joint effusion without acute fracture. Electronically Signed   By: Inez Catalina M.D.   On: 11/29/2020 18:58   DG Chest Port 1 View  Result Date: 11/29/2020 CLINICAL DATA:  Removal of Port-A-Cath EXAM: PORTABLE CHEST 1 VIEW COMPARISON:  11/27/2020 FINDINGS: There is been interval removal of hemodialysis catheter on the right. No visible retained fragments. Prior CABG. Cardiomegaly. Vascular congestion. Bilateral layering effusions and lower lobe airspace opacity. Probable mild edema/CHF. IMPRESSION: Interval removal of right hemodialysis catheter. CHF.  Layering effusions. Electronically Signed   By: Rolm Baptise M.D.   On: 11/29/2020 16:58   DG Chest Port 1 View  Result Date: 11/27/2020 CLINICAL DATA:  Fall.  Chest and right foot pain. EXAM: PORTABLE CHEST 1 VIEW COMPARISON:  09/01/2020 FINDINGS: Unchanged mild cardiomegaly. Unchanged mild pulmonary vascular congestion. Small bilateral pleural effusions are not significantly changed since prior examination. Interval placement of dialysis catheter. Median sternotomy changes again noted. IMPRESSION: No acute abnormality  of the chest. Unchanged findings of mild CHF/fluid volume overload. Electronically Signed   By: Miachel Roux M.D.   On: 11/27/2020 11:24   DG Foot 2 Views Right  Result Date: 11/27/2020 CLINICAL DATA:  wound to lateral fore foot EXAM: RIGHT FOOT - 2 VIEW COMPARISON:  Radiograph 08/18/2020 FINDINGS: There is no evidence of acute fracture or dislocation. Unchanged bulky ossification along the base of the fifth metatarsal, possibly from prior trauma. There is a lateral forefoot soft tissue defect. There is no frank bony destruction radiographically. Mild midfoot and forefoot degenerative changes. Plantar and dorsal  calcaneal spurring. Vascular calcifications. IMPRESSION: Lateral forefoot soft tissue defect. No frank bony destruction radiographically to suggest osteomyelitis. MRI would be more sensitive. Electronically Signed   By: Maurine Simmering M.D.   On: 11/27/2020 11:32    Assessment and Plan:   1. HFmrEF - Echo this admission shows a reduced EF of 45 to 50% with the basal inferior segment being akinetic and severe asymmetric LVH with low normal RV function. EF 60-65% in 05/2020 but previously 45% in 12/2019. - He denies any recent specific anginal symptoms and troponin values have been elevated as outlined above but suspect this is secondary to demand ischemia in the setting of sepsis. - His volume status is managed by hemodialysis and medical therapy would be limited secondary to his ESRD.  Pending his BP trend with his session today, could consider adding a low-dose beta-blocker to help with his cardiomyopathy and also with his atrial fibrillation. Doubt his BP will allow for hydralazine/nitrates at this time. No ACE-I/ARB/ARNi/Spiro/SGLT2 inhibitor given his ESRD.   2. CAD/Elevated Troponin Values - He is s/p CABG in 02/2017 with LIMA-LAD, SVG-PDA and SVG-OM. Reports dyspnea on exertion but no recent chest pain.  - His troponin values were elevated at 312 and 485 this admission which is likely secondary to demand ischemia in the setting of his acute illness. - He was on Plavix prior to admission but this has now been held pending upcoming dialysis catheter procedures.  He is on Repatha as an outpatient given intolerance to statins. Would consider adding a beta-blocker as outlined above.  3. Pulmonary HTN - He did undergo an RHC in 06/2020 and was found to have moderate pulmonary hypertension which was primarily due to fluid overload and high output state from aVF. Was not recommended to start vasodilators at that time.  Volume management per HD.    4. Paroxysmal atrial fibrillation (likely persistent) -  He has a history of this but by review of EKG's from earlier this year, he appears to have been remaining in atrial fibrillation and question if this is possibly playing a role in his cardiomyopathy. Would consider adding a beta-blocker pending BP trend with dialysis. - Was previously on Eliquis for anticoagulation given his history of a prior PE and recurrent DVTs discontinued earlier this year due to his worsening anemia and heme-positive stools.  5. ESRD - Being followed by Nephrology with plans for HD today.   6. Bacteremia - Cultures this admission have been positive for MRSA and strep species with ID recommending a TEE.  Given schedule availability, this is not able to be performed today but has been added to the schedule for Monday at approximately 1445. After careful review of history and examination, the risks and benefits of transesophageal echocardiogram have been explained including risks of esophageal damage, perforation (1:10,000 risk), bleeding, pharyngeal hematoma as well as other potential complications associated with conscious sedation including aspiration,  arrhythmia, respiratory failure and death. Alternatives to treatment were discussed, questions were answered. Patient is willing to proceed.  - His wife does report he has experienced intermittent dysphagia with liquids and solid foods. I made the admitting team aware and Dr. Carles Collet informed me he has already made GI aware and they will be seeing the patient later today. She also expressed concerns about him having amnesia following his procedure on 8/29 and by review of the [facility], he received multiple medications during the procedure including Versed, Fentanyl, Propofol, Neo-Synephrine, Vasopressin, Epinephrine, Ephedrine sulfate and Cefazolin.    Risk Assessment/Risk Scores:    CHA2DS2-VASc Score = 5   This indicates a 7.2% annual risk of stroke. The patient's score is based upon: CHF History: 1 HTN History: 1 Diabetes History:  1 Stroke History: 0 Vascular Disease History: 1 Age Score: 1 Gender Score: 0   For questions or updates, please contact [facility] Please consult [email or web address] for contact info under    Signed, Erma Heritage, PA-C  12/01/2020 10:34 AM    Attending note:  Patient seen and examined.  I reviewed the chart and discussed the case with Ms. Ahmed Prima PA-C.  Cardiology is consulted to arrange TEE in light of MRSA bacteremia, also due to newly documented mild LV dysfunction with LVEF 45 to 50% (although approximately 45% by evaluation in November 2021 as well).  On examination patient appears comfortable.  Currently afebrile.  Heart rate is in the 80s in atrial fibrillation by telemetry which I personally reviewed.  Systolic blood pressure 449Q to 140s.  Lungs are clear.  Cardiac exam with irregularly irregular rhythm and 1/6 systolic murmur.  Pertinent lab work includes potassium 4.3, BUN 69, creatinine 6.35, hemoglobin 9.3, platelets 166.  Initial high-sensitivity troponin I levels 300-400 range with suspected demand ischemia rather than ACS.  Blood cultures positive for MRSA and strep species.  Reviewed his ECG from October 17 showing rate controlled atrial fibrillation with nonspecific ST changes and poor R wave progression rule out old anterior infarct pattern.  Chest x-ray reports pleural effusions.  TEE discussed with patient and wife, they did raise concerns about intermittent dysphagia of both liquids and solids.  Would ask for GI consultation to clarify no substantial esophageal pathology that would inhibit proceeding with TEE.  Also mentioned having amnesia after procedure in August during which he received multiple medications as discussed above.  Would hopefully be able to tolerate TEE under propofol.  As it relates to mild LV dysfunction, fluid status is being managed via hemodialysis with ESRD at baseline.  May be able to add low-dose beta-blocker but would not initiate  hydralazine/nitrate combination as yet pending follow-up of blood pressure trend.  He is on Repatha as an outpatient with known CAD and prior CABG and in fact was on Plavix as well (held in light of dialysis catheter procedures).  He would be a candidate for anticoagulation related to atrial fibrillation and stroke risk, however he was taken off Eliquis previously (prior PE and recurrent DVT) due to worsening anemia and heme positive stools.  Overall fairly complex situation and several specialist consulted by primary team.  Satira Sark, M.D., F.A.C.C.

## 2020-12-01 NOTE — Progress Notes (Signed)
This LPN. Currently locked out of system, using charge nurses computer. This LPN sent message to on call in regards to  pt. diet says pt is NPO, are we holding morning meds?? calcium acetate (PHOSLO) capsule 1,334 mg scheduled for 0800 . Awaiting orders. Pt laying in bed with eyeS open. Easy to arose.  Demitra "Joy" [id number],EXP

## 2020-12-01 NOTE — Progress Notes (Signed)
ANTICOAGULATION CONSULT NOTE  Pharmacy Consult for Heparin Indication: hx DVT/afib  Allergies  Allergen Reactions   Latex Hives and Rash   Lipitor [Atorvastatin]     Leg aches     Patient Measurements: Height: 6\' 2"  (188 cm) Weight: 117.9 kg (259 lb 14.8 oz) IBW/kg (Calculated) : 82.2 HEPARIN DW (KG): 107.6   Vital Signs: Temp: 98.7 F (37.1 C) (10/20 2113) Temp Source: Oral (10/20 2113) BP: 136/66 (10/20 2113) Pulse Rate: 83 (10/20 2113)  Labs: Recent Labs    11/28/20 0449 11/29/20 0440 11/30/20 0453 11/30/20 0453 11/30/20 1347 11/30/20 2202 12/01/20 0007  HGB 10.0* 9.4* 9.0*  --   --   --   --   HCT 32.4* 31.9* 29.7*  --   --   --   --   PLT 185 162 163  --   --   --   --   APTT  --   --  49*   < > 42* >200* >200*  HEPARINUNFRC  --   --  >1.10*  --   --   --   --   CREATININE 4.38* 6.10* 5.11*  --   --   --   --    < > = values in this interval not displayed.     Estimated Creatinine Clearance: 18.6 mL/min (A) (by C-G formula based on SCr of 5.11 mg/dL (H)).   Assessment: 70 y.o. male with h/o DVT and Afib, Eliquis on hold, for heparin.  aPTT supratherapeutic  Goal of Therapy:  APTT 66-102 sec Heparin level 0.3-0.7 units/ml Monitor platelets by anticoagulation protocol: Yes   Plan:  Hold heparin x 1 hour, then decrease heparin 1600 units/hr aPTT in 8 hours  Phillis Knack, PharmD, BCPS  12/01/2020 12:54 AM

## 2020-12-01 NOTE — Progress Notes (Signed)
[facility] PROGRESS NOTE  Assessment/ Plan: Pt is a 70 y.o. yo male  with a PMH signiciant for CAD s/p CABG, DM type 2, HTN, HLD, h/o DVT/PE, pulmonary HTN, OSA on CPAP, chronic CHF, and ESRD on HD MWF at [facility] who presented to [facility] ED after falling at home.  Dialysis Orders: Center: [facility]  on MWF . EDW 120.5kg HD Bath 3K/2.5Ca  Time 4 hours Heparin 2000 units bolus. Access RIJ TDC (maturing LUE AVF) BFR 400 DFR 800    Calcitriol 0.5 mcg po/HD Venofer  100 mg TIW, Micera 100 mcg IVP every 2 weeks   #Acute hypoxic respiratory failure presumed due to volume overload/acute diastolic CHF: Mild improvement with ultrafiltration.  Challenge dry weight with dialysis.  #MRSA bactermia: Afebrile this am. S/p TDC removal on 10/19. Continue IV vancomycim TEE per ID.  Repeat blood cultures pending.  # ESRD MWF: Getting hemodialysis today.  Cannulated AV fistula with blood flow around 200.  We will try to use AV fistula today in order to avoid placement of temporary HD catheter.  If the blood culture remains negative then the patient likely needs tunneled HD catheter on Monday.  I lowered dialysate temperature and added abdomen for intradialytic hypotension.  Appreciate Dr. Sarajane Jews assistant.  # Anemia: Hemoglobin variable,  Aranesp with HD today. No iron because of bacteremia.   # Secondary hyperparathyroidism: Monitor calcium phosphorus level.  Continue calcitriol, phoslo.  # HTN/volume: Monitor blood pressure.  Challenge ultrafiltration to maintain euvolemia.  Management of intradialytic hypotension as above.  # Hyponatremia, hypervolumic: UF during HD, Na improving. Recommend fluid restriction.   Subjective: Seen and examined at dialysis unit.  Cannulated AV fistula with both needles.  Blood flow is low.  Patient denies nausea, vomiting, chest pain, shortness of breath. Objective Vital signs in last 24 hours: Vitals:   12/01/20 0300 12/01/20 0348 12/01/20 0529 12/01/20 0600   BP:  (!) 129/58    Pulse:  79 88   Resp:  16    Temp:  98.1 F (36.7 C)    TempSrc:  Oral    SpO2:  (!) 76% 97%   Weight: 119.8 kg   119.9 kg  Height:       Weight change: 1.94 kg  Intake/Output Summary (Last 24 hours) at 12/01/2020 1153 Last data filed at 12/01/2020 0600 Gross per 24 hour  Intake 614.72 ml  Output --  Net 614.72 ml        Labs: Basic Metabolic Panel: Recent Labs  Lab 11/27/20 1711 11/28/20 0449 11/29/20 0440 11/30/20 0453 12/01/20 0538  NA 135   < > 131* 133* 130*  K 3.8   < > 4.1 3.9 4.3  CL 100   < > 95* 97* 93*  CO2 24   < > 26 25 24   GLUCOSE 178*   < > 138* 120* 162*  BUN 74*   < > 60* 49* 69*  CREATININE 6.75*   < > 6.10* 5.11* 6.35*  CALCIUM 8.0*   < > 7.9* 8.0* 8.4*  PHOS 4.4  --   --  3.2 3.9   < > = values in this interval not displayed.    Liver Function Tests: Recent Labs  Lab 11/27/20 1711 11/30/20 0453 12/01/20 0538  ALBUMIN 2.6* 2.5* 2.5*    No results for input(s): LIPASE, AMYLASE in the last 168 hours. No results for input(s): AMMONIA in the last 168 hours. CBC: Recent Labs  Lab 11/27/20 1711 11/28/20 0449 11/29/20  0045 11/30/20 0453 12/01/20 0538  WBC 15.8* 14.7* 15.0* 14.0* 14.1*  HGB 9.1* 10.0* 9.4* 9.0* 9.3*  HCT 30.1* 32.4* 31.9* 29.7* 30.3*  MCV 90.4 90.3 92.7 90.8 90.4  PLT 156 185 162 163 166    Cardiac Enzymes: No results for input(s): CKTOTAL, CKMB, CKMBINDEX, TROPONINI in the last 168 hours. CBG: Recent Labs  Lab 11/27/20 2216 11/29/20 2009  GLUCAP 128* 130*     Iron Studies: No results for input(s): IRON, TIBC, TRANSFERRIN, FERRITIN in the last 72 hours. Studies/Results: DG Elbow 2 Views Right  Result Date: 11/29/2020 CLINICAL DATA:  Pain and swelling in the right elbow lung recent fall, initial encounter EXAM: RIGHT ELBOW - 2 VIEW COMPARISON:  None. FINDINGS: Mild degenerative changes of the right elbow joint are seen. No acute fracture or dislocation is noted. Small joint effusion  is noted anteriorly. Olecranon spurring is seen. IMPRESSION: Small joint effusion without acute fracture. Electronically Signed   By: Inez Catalina M.D.   On: 11/29/2020 18:58   DG Chest Port 1 View  Result Date: 11/29/2020 CLINICAL DATA:  Removal of Port-A-Cath EXAM: PORTABLE CHEST 1 VIEW COMPARISON:  11/27/2020 FINDINGS: There is been interval removal of hemodialysis catheter on the right. No visible retained fragments. Prior CABG. Cardiomegaly. Vascular congestion. Bilateral layering effusions and lower lobe airspace opacity. Probable mild edema/CHF. IMPRESSION: Interval removal of right hemodialysis catheter. CHF.  Layering effusions. Electronically Signed   By: Rolm Baptise M.D.   On: 11/29/2020 16:58    Medications: Infusions:  sodium chloride     sodium chloride     heparin Stopped (12/01/20 1007)   vancomycin 1,000 mg (11/29/20 1400)    Scheduled Medications:  calcitRIOL  0.5 mcg Oral Q M,W,F   calcium acetate  1,334 mg Oral TID with meals   Chlorhexidine Gluconate Cloth  6 each Topical Q0600   Chlorhexidine Gluconate Cloth  6 each Topical Q0600   Chlorhexidine Gluconate Cloth  6 each Topical Q0600   loratadine  10 mg Oral q1800   mouth rinse  15 mL Mouth Rinse BID   mupirocin ointment   Nasal BID   pantoprazole  40 mg Oral Daily    have reviewed scheduled and prn medications.  Physical Exam: General: He looks comfortable, able to lie flat. Heart:RRR, s1s2 nl Lungs: Clear anteriorly, no increased work of breathing Abdomen:soft, Non-tender, non-distended Extremities: Lower extremity pitting edema+ Dialysis Access: Right IJ TDC removed and has dressing on.  Left upper extremity AV fistula has good thrill and bruit and connected to the machine.  Delorus Langwell Prasad Caitlynne Harbeck 12/01/2020,11:53 AM  LOS: 4 days

## 2020-12-01 NOTE — Consult Note (Signed)
WOC Nurse Consult Note: Reason for Consult:Reconsulted by Dr.Tat for reassessment of wound to right plantar and lateral foot. Initial consult performed on 11/27/20 Wound type:Neuropathic Pressure Injury POA: N/A Measurement:Per Monday, 0.4cm x 0.3cm x 0.2cm Wound QNV:VYXAJ red, moist Drainage (amount, consistency, odor)  Periwound:Macerated with purple discoloration on lateral foot not resolved Dressing procedure/placement/frequency: I will increase wound care to twice daily in an attempt to resolve maceration as I suspect with patient being OOB more than previously, dependent edema may be contributing to dressing saturation.  I will provide bilateral pressure redistribution heel boots, a sacral bordered foam and a pressure redistribution chair cushion to prevent Pressure Injury in this patient with multiple comorbid conditions.  Agree with MRI to rule out osteomyelitis as recommended by ID, Dr. Juleen China. Recommend continued follow up with podiatric medicine (Drs. Volanda Napoleon) post discharge per their direction.  [facility] nursing team will not follow, but will remain available to this patient, the nursing and medical teams.  Please re-consult if needed. Thanks, Maudie Flakes, MSN, RN, [place], Arther Abbott  Pager# [phone]

## 2020-12-01 NOTE — Procedures (Signed)
   HEMODIALYSIS TREATMENT NOTE:   AVF was cannulated with 17g needles (antegrade) and tolerated Qb 200 cc/min with stable albeit high venous pressures (160 mmHg with Qb 200) and no evidence of infiltration.  May yet require [facility] before discharge but we will re-evaluate this on Monday next week-- Dr. Constance Haw is aware.  4 hour session completed at Qb 200 / Qd 600.  Surveillance blood cultures x2 were collected.  Albumin 25g was given twice for SBP<100 (asymptomatic).  Vancomycin and Cefepime were given during last 1.5 hours of treatment.  All blood was returned.  Goal met: net UF 3.3 liters.  Hemostasis was achieved in 20 minutes.  No changes from pre-HD assessment.   Rockwell Alexandria, RN

## 2020-12-01 NOTE — Consult Note (Signed)
Referring Provider: Orson Eva, DO Primary Care Physician:  [name], MD Primary Gastroenterologist:  Dr. Laural Golden  Reason for Consultation:    Dysphagia.  HPI:   Patient is 70 year old Caucasian male with complicated medical history.  He has coronary artery disease history of diabetes mellitus DVT pulmonary embolism coronary artery disease status post CABG in 2018 history of atrial fibrillation pulmonary hypertension anemia chronic kidney disease who has been on dialysis for 3 months who presented to emergency room 4 days ago with cough progressive weakness and shortness of breath.  He was also noted to have ulcer over sole of his right foot laterally. He was noted to be hypoxic.  Chest film suggested CHF/fluid overload. Lab studies revealed WBC of 15.8 H&H of 9.1 and 30.1 and a platelet count of 156K. Troponin level was elevated at 312 and repeat was 485.  Metabolic 7 reveals serum sodium 136, potassium 4.4, chloride 101 CO2 is 32.  Glucose was 178.  BUN was 69 and creatinine 6.38. Nasal screen was positive for MRSA.  He was felt to be in cardiopulmonary failure due to fluid overload.  He was admitted to hospitalist service.  Blood cultures were drawn and they came back positive for MRSA a day later.  Echocardiogram on 11/28/2020 revealed ejection fraction 45 to 50%.  Mildly decreased LV function.  Severe asymmetric left ventricular hypertrophy with dilated left and right atria elevated pulmonary artery systolic pressures small pericardial effusion left pleural effusion mitral valve regurgitation and dilated IVC with decreased respiratory variability suggesting elevated right atrial pressures.  Dr. Constance Haw was consulted and right IJ line was removed on 11/29/2020. Patient had repeat blood cultures yesterday and now positive for E. coli.  Another blood cultures from today are pending.  Patient is on cefepime and vancomycin. Patient has been receiving hemodialysis during his  admission. Cardiology consultation was requested earlier today and he was seen by Dr. Johnny Bridge. Dr. Domenic Polite recommended TEE to rule out bacterial endocarditis.  This study is planned for now next week Monday.  He has been concerned because of dysphagia. He felt patient's elevated troponin level secondary to demand ischemia rather than acute injury. He also has atrial fibrillation and presently is on heparin infusion.  Prior to admission he was on clopidogrel which he is not taking now.  He also had MRI of right foot which could suggest small abscess but no definite osteomyelitis.  Patient states he feels some better.  He is able to breathe better.  He is not coughing. He states he has had dysphagia for about a year.  He has difficulty both with solids as well as liquids.  He points to upper sternal area as site of bolus obstruction.  He states he has had food impaction relieved with regurgitation on few occasions.  No history of hematemesis.  He says heartburn is well controlled with PPI.  He does not have good appetite but he has not lost any weight.  He denies abdominal pain melena or rectal bleeding.  He says lower extremity edema has decreased since he has been on hemodialysis which was started 3 months ago.  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  He is married.  2 grown up children.  He does not smoke cigarettes or drink alcohol.  He is still driving. Father lived to be 9.  Mother had lymphoma and died 42 years after  diagnosis at age 58.  He lost 1 sister also of lymphoma.  She was possibly in her 82s or 14s.  He has a sister and a brother living well in early 36s and doing well.    Past Medical History:  Diagnosis Date   Anemia associated with chronic renal failure basically   Asthma    as a child   CHF (congestive heart failure) ([place])    Chronic kidney disease    Sees Dr [name]    Constipation    Coronary artery disease    COVID-19    Depression    Diabetes mellitus without complication ([place])    DVT (deep venous thrombosis) ([place])    History of blood clots    Hyperlipidemia    Hypertension    Leg pain    ABIs/LE Arterial US 03/2019: ABIs unreliable; +calcification, no evidence of stenosis   Myocardial infarction [facility])    Obesity    Pneumonia 2018   Pulmonary embolism ([facility])    Sleep apnea    CPAP   SOB (shortness of breath)    Swelling of both lower extremities     Past Surgical History:  Procedure Laterality Date   A/V FISTULAGRAM Left 09/29/2020   Procedure: A/V FISTULAGRAM;  Surgeon: Angelia Mould, MD;  Location: [place] [facility];  Service: Cardiovascular;  Laterality: Left;   AV FISTULA PLACEMENT Left 05/04/2020   Procedure: LEFT ARM ARTERIOVENOUS (AV) FISTULA CREATION;  Surgeon: Rosetta Posner, MD;  Location: [facility];  Service: Vascular;  Laterality: Left;   COLONOSCOPY     CORONARY ARTERY BYPASS GRAFT N/A 03/07/2017   Procedure: CORONARY ARTERY BYPASS GRAFTING (CABG), X 3 , USING RIGHT INTERNAL MAMMARY ARTERY, AND RIGHT LEG GREATER SAPHENOUS VEIN HARVESTED ENDOSCOPICALLY;  Surgeon: [name], MD;  Location: [place];  Service: Open Heart Surgery;  Laterality: N/A;   FISTULA SUPERFICIALIZATION Left 10/09/2020   Procedure: FISTULA SUPERFICIALIZATION;  Surgeon: Angelia Mould, MD;  Location: [facility];  Service: Vascular;  Laterality: Left;   HAND SURGERY Right 1991   IR FLUORO GUIDE CV LINE RIGHT  09/04/2020   IR US GUIDE VASC ACCESS RIGHT  09/04/2020   LIGATION OF COMPETING BRANCHES OF ARTERIOVENOUS FISTULA Left 10/09/2020   Procedure: LIGATION OF 2 LARGE COMPETING BRANCHES OF LEFT BRACHIOCEPHALIC FISTULA;  Surgeon: Angelia Mould, MD;  Location: [place];  Service: Vascular;  Laterality: Left;   RIGHT HEART CATH N/A 07/07/2020   Procedure: RIGHT HEART CATH;  Surgeon: Jolaine Artist, MD;  Location: [place] [facility];  Service:  Cardiovascular;  Laterality: N/A;   RIGHT/LEFT HEART CATH AND CORONARY ANGIOGRAPHY N/A 12/20/2016   Procedure: RIGHT/LEFT HEART CATH AND CORONARY ANGIOGRAPHY;  Surgeon: [name], MD;  Location: [place] [facility];  Service: Cardiovascular;  Laterality: N/A;   TEE WITHOUT CARDIOVERSION N/A 03/07/2017   Procedure: TRANSESOPHAGEAL ECHOCARDIOGRAM (TEE);  Surgeon: [name], [name], MD;  Location: [place];  Service: Open Heart Surgery;  Laterality: N/A;   TONSILLECTOMY      Prior to Admission medications   Medication Sig Start Date End Date Taking? Authorizing Provider  albuterol (VENTOLIN HFA) 108 (90 Base) MCG/ACT inhaler Inhale 1-2 puffs into the lungs every 6 (six) hours as needed for wheezing or shortness of breath. 07/04/20  Yes [name], MD  calcium acetate (PHOSLO) 667 MG capsule Take 1,334 mg by mouth 3 (three) times daily. 09/19/20  Yes [provider]  clopidogrel (PLAVIX) 75 MG tablet Take 1  tablet (75 mg total) by mouth daily. 11/09/20  Yes Nahser, Wonda Cheng, MD  ferrous sulfate (FERROUSUL) 325 (65 FE) MG tablet Take 1 tablet (325 mg total) by mouth daily with breakfast. 01/28/20  Yes [name], MD  fluticasone (FLONASE) 50 MCG/ACT nasal spray Place 2 sprays into both nostrils daily. Patient taking differently: Place 2 sprays into both nostrils daily as needed for allergies. 05/01/20  Yes [name], MD  levocetirizine (XYZAL) 5 MG tablet Take 5 mg by mouth every evening.   Yes [provider]  pantoprazole (PROTONIX) 40 MG tablet Take 1 tablet (40 mg total) by mouth daily. 08/18/20  Yes [name], MD  Evolocumab (REPATHA SURECLICK) 638 MG/ML SOAJ Inject 1 pen into the skin every 14 (fourteen) days. Patient not taking: Reported on 11/27/2020 06/23/20   Nahser, Wonda Cheng, MD  fluorouracil (EFUDEX) 5 % cream Apply topically 2 (two) times daily. Patient not taking: Reported on 11/27/2020 10/11/20   [provider]    Current  Facility-Administered Medications  Medication Dose Route Frequency Provider Last Rate Last Admin   acetaminophen (TYLENOL) tablet 650 mg  650 mg Oral Q6H PRN Arrien, Jimmy Picket, MD   650 mg at 11/30/20 7564   Or   acetaminophen (TYLENOL) suppository 650 mg  650 mg Rectal Q6H PRN Arrien, Jimmy Picket, MD       albuterol (PROVENTIL) (2.5 MG/3ML) 0.083% nebulizer solution 2.5 mg  2.5 mg Nebulization Q6H PRN Arrien, Jimmy Picket, MD       calcitRIOL (ROCALTROL) capsule 0.5 mcg  0.5 mcg Oral Q M,W,F Rosita Fire, MD   0.5 mcg at 12/01/20 0947   calcium acetate (PHOSLO) capsule 1,334 mg  1,334 mg Oral TID with meals Arrien, Jimmy Picket, MD   1,334 mg at 12/01/20 0946   ceFEPIme (MAXIPIME) 2 g in sodium chloride 0.9 % 100 mL IVPB  2 g Intravenous Q M,W,F-HD Orson Eva, MD   Stopped at 12/01/20 1559   Chlorhexidine Gluconate Cloth 2 % PADS 6 each  6 each Topical Q0600 Donato Heinz, MD   6 each at 11/30/20 3329   Chlorhexidine Gluconate Cloth 2 % PADS 6 each  6 each Topical Q0600 Rosita Fire, MD   6 each at 11/30/20 [phone]   Chlorhexidine Gluconate Cloth 2 % PADS 6 each  6 each Topical Q0600 Rosita Fire, MD   6 each at 11/30/20 0938   fluticasone (FLONASE) 50 MCG/ACT nasal spray 2 spray  2 spray Each Nare Daily PRN Arrien, Jimmy Picket, MD       heparin ADULT infusion 100 units/mL (25000 units/256m)  1,600 Units/hr Intravenous Continuous Tat, DShanon Brow MD   Stopped at 12/01/20 1007   loratadine (CLARITIN) tablet 10 mg  10 mg Oral q1800 ATawni Millers MD   10 mg at 11/30/20 1737   MEDLINE mouth rinse  15 mL Mouth Rinse BID Arrien, MJimmy Picket MD   15 mL at 12/01/20 04166  mupirocin ointment (BACTROBAN) 2 %   Nasal BID ATawni Millers MD   Given at 12/01/20 0948   ondansetron (ZOFRAN) tablet 4 mg  4 mg Oral Q6H PRN Arrien, MJimmy Picket MD       Or   ondansetron ([facility] injection 4 mg  4 mg Intravenous Q6H PRN Arrien, MJimmy Picket MD   4 mg at 11/28/20 0007   pantoprazole (PROTONIX) EC tablet 40 mg  40 mg Oral Daily Arrien, MJimmy Picket MD   [phone]  mg at 12/01/20 0947   vancomycin (VANCOCIN) IVPB 1000 mg/200 mL premix  1,000 mg Intravenous Q M,W,F-HD Laren Everts, RPH   Stopped at 12/01/20 1505    Allergies as of 11/27/2020 - Review Complete 11/27/2020  Allergen Reaction Noted   Latex Hives and Rash 09/03/2020   Lipitor [atorvastatin]  03/23/2019    Family History  Problem Relation Age of Onset   Heart disease Mother    Diabetes Mother    Multiple myeloma Mother    Cancer Father        type unknown, as a child   Heart disease Father    Hypertension Father    Congenital heart disease Father    Hyperlipidemia Father    Lymphoma Sister     Social History   Socioeconomic History   Marital status: Married    Spouse name: Not on file   Number of children: 2   Years of education: Not on file   Highest education level: Not on file  Occupational History   Occupation: general contractor-retired  Tobacco Use   Smoking status: Never   Smokeless tobacco: Never  Vaping Use   Vaping Use: Never used  Substance and Sexual Activity   Alcohol use: No   Drug use: No   Sexual activity: Never  Other Topics Concern   Not on file  Social History Narrative   Not on file   Social Determinants of Health   Financial Resource Strain: Not on file  Food Insecurity: Not on file  Transportation Needs: Not on file  Physical Activity: Not on file  Stress: Not on file  Social Connections: Not on file  Intimate Partner Violence: Not on file    Review of Systems: See HPI, otherwise normal ROS  Physical Exam: Temp:  [98 F (36.7 C)-98.7 F (37.1 C)] 98 F (36.7 C) (10/21 1545) Pulse Rate:  [65-95] 72 (10/21 1545) Resp:  [16-18] 16 (10/21 1545) BP: (90-136)/(57-72) 117/62 (10/21 1545) SpO2:  [76 %-98 %] 98 % (10/21 1120) Weight:  [118.1 kg-119.9 kg] 118.1 kg (10/21 1120) Last BM Date:  11/30/20  Patient is alert and in no acute distress. He appears chronically ill and pale. Conjunctiva is also pale.  Sclera is nonicteric. Oropharyngeal mucosa is normal.  Dentition in satisfactory condition. No neck masses thyromegaly or lymphadenopathy. He has dressing below the right clavicle site of prior IJ cannula which has been removed. Cardiac exam with irregular rhythm normal S1 and S2.  No murmur or gallop noted. Auscultation lungs reveal vesicular breath sounds bilaterally. Abdomen is full.  Bowel sounds are normal.  On palpation is soft and nontender with organomegaly or masses. He has 2+ pitting edema involving both legs.  Intake/Output from previous day: 10/20 0701 - 10/21 0700 In: 854.7 [P.O.:800; I.V.:54.7] Out: -  Intake/Output this shift: Total I/O In: 330.6 [I.V.:62.6; IV Piggyback:268] Out: 3300 [Other:3300]  Lab Results: Recent Labs    11/29/20 0440 11/30/20 0453 12/01/20 0538  WBC 15.0* 14.0* 14.1*  HGB 9.4* 9.0* 9.3*  HCT 31.9* 29.7* 30.3*  PLT 162 163 166   BMET Recent Labs    11/29/20 0440 11/30/20 0453 12/01/20 0538  NA 131* 133* 130*  K 4.1 3.9 4.3  CL 95* 97* 93*  CO2 _0 GLUCOSE 138* 120* 162*  BUN 60* 49* 69*  CREATININE 6.10* 5.11* 6.35*  CALCIUM 7.9* 8.0* 8.4*   LFT Recent Labs    12/01/20 0538  ALBUMIN 2.5*   PT/INR No results  for input(s): LABPROT, INR in the last 72 hours. Hepatitis Panel No results for input(s): HEPBSAG, HCVAB, HEPAIGM, HEPBIGM in the last 72 hours.  Studies/Results: DG Elbow 2 Views Right  Result Date: 11/29/2020 CLINICAL DATA:  Pain and swelling in the right elbow lung recent fall, initial encounter EXAM: RIGHT ELBOW - 2 VIEW COMPARISON:  None. FINDINGS: Mild degenerative changes of the right elbow joint are seen. No acute fracture or dislocation is noted. Small joint effusion is noted anteriorly. Olecranon spurring is seen. IMPRESSION: Small joint effusion without acute fracture.  Electronically Signed   By: Inez Catalina M.D.   On: 11/29/2020 18:58   MR FOOT RIGHT WO CONTRAST  Result Date: 12/01/2020 CLINICAL DATA:  Right foot ulcer. EXAM: MRI OF THE RIGHT FOREFOOT WITHOUT CONTRAST TECHNIQUE: Multiplanar, multisequence MR imaging of the right forefoot was performed. No intravenous contrast was administered. COMPARISON:  Right foot x-rays dated November 27, 2020. FINDINGS: Bones/Joint/Cartilage Faint marrow edema the fifth metatarsal head. Remaining marrow signal is normal. No fracture or dislocation. Mild second TMT joint osteoarthritis. No joint effusion. Ligaments Collateral ligaments are intact.  Lisfranc ligament is intact. Muscles and Tendons Flexor and extensor tendons are intact. Increased T2 signal within the intrinsic muscles of the forefoot, nonspecific, but likely related to diabetic muscle changes. Soft tissue Diffuse soft tissue swelling. Soft tissue ulceration along the lateral forefoot near the fifth metatarsal head with underlying 2.1 x 0.4 x 2.3 cm curvilinear fluid collection (series 8, image 26). No soft tissue mass. IMPRESSION: 1. Soft tissue ulceration along the lateral forefoot near the fifth metatarsal head with underlying 2.3 cm curvilinear fluid collection, concerning for abscess. 2. Faint marrow edema in the fifth metatarsal head is nonspecific, but concerning for early osteomyelitis given adjacent abscess and soft tissue ulcer. Electronically Signed   By: Titus Dubin M.D.   On: 12/01/2020 14:16    Assessment;  Patient is 70 year old Caucasian male with multiple comorbidities who is being evaluated for dysphagia to solids and liquids which appears to be quite bothersome. His current hospitalization is pertinent for respiratory failure, CHF, MRSA bacteremia and IJ [place] has been removed and follow-up blood cultures reveals E. coli.  He also has anemia but no evidence of overt GI bleed.  He is on heparin infusion for history of atrial fibrillation and DVT  and pulmonary embolism.  He is scheduled for TEE on 12/04/2020 to make sure he does not have endocarditis.  Elevated troponin levels felt to be due to demand ischemia and not indicative of MI. He has end-stage renal disease and has been on hemodialysis for 3 months.  He has finished dialysis today.   As far as patient's dysphagia is concerned I suspect benign etiology such as esophageal motility disorder esophageal stricture or ring.  Neoplastic process is highly unlikely given long duration.  Since he is having daily dysphagia it would be reasonable to proceed with diagnostic EGD with dilation if appropriate.  Findings at EGD would also help reduce the risk of TEE which is planned for next week. I have discussed patient's condition with Dr. Carles Collet who feels he is stable to undergo this procedure. Will need to hold heparin 6 hours prior to procedure.  Recommendations;  Hold heparin at 5 AM in the morning. Esophagogastroduodenoscopy with possible esophageal dilation on 12/02/2020 under monitored anesthesia care. I have talked with patient's wife Ms. Alberta Cairns over the phone and she is very much interested for her husband to undergo the study to find out why  he has swallowing difficulty.   LOS: 4 days   Lashelle Koy  12/01/2020, 4:59 PM

## 2020-12-01 NOTE — Progress Notes (Signed)
[facility]  Positive blood cultures continue. Will have to wait until Tuesday for permacath. Will change schedule. HD doing ok today with fistula.    Curlene Labrum, MD [facility] [address] [place], [place] [address] [phone] (office)

## 2020-12-02 ENCOUNTER — Encounter (HOSPITAL_COMMUNITY)
Admission: EM | Disposition: A | Discharge status: Skilled Nursing Facility | Payer: Self-pay | Source: Home / Self Care | Attending: Internal Medicine

## 2020-12-02 ENCOUNTER — Inpatient Hospital Stay (HOSPITAL_COMMUNITY): Payer: PPO | Admitting: Anesthesiology

## 2020-12-02 DIAGNOSIS — K766 Portal hypertension: Secondary | ICD-10-CM

## 2020-12-02 DIAGNOSIS — K3189 Other diseases of stomach and duodenum: Secondary | ICD-10-CM

## 2020-12-02 HISTORY — PX: ESOPHAGEAL DILATION: SHX303

## 2020-12-02 HISTORY — PX: ESOPHAGOGASTRODUODENOSCOPY (EGD) WITH PROPOFOL: SHX5813

## 2020-12-02 LAB — HEPATIC FUNCTION PANEL
ALT: 130 U/L — ABNORMAL HIGH (ref 0–44)
AST: 347 U/L — ABNORMAL HIGH (ref 15–41)
Albumin: 3.1 g/dL — ABNORMAL LOW (ref 3.5–5.0)
Alkaline Phosphatase: 174 U/L — ABNORMAL HIGH (ref 38–126)
Bilirubin, Direct: 0.5 mg/dL — ABNORMAL HIGH (ref 0.0–0.2)
Indirect Bilirubin: 1.1 mg/dL — ABNORMAL HIGH (ref 0.3–0.9)
Total Bilirubin: 1.6 mg/dL — ABNORMAL HIGH (ref 0.3–1.2)
Total Protein: 6.9 g/dL (ref 6.5–8.1)

## 2020-12-02 LAB — CBC
HCT: 32.9 % — ABNORMAL LOW (ref 39.0–52.0)
Hemoglobin: 9.8 g/dL — ABNORMAL LOW (ref 13.0–17.0)
MCH: 27.2 pg (ref 26.0–34.0)
MCHC: 29.8 g/dL — ABNORMAL LOW (ref 30.0–36.0)
MCV: 91.4 fL (ref 80.0–100.0)
Platelets: 178 10*3/uL (ref 150–400)
RBC: 3.6 MIL/uL — ABNORMAL LOW (ref 4.22–5.81)
RDW: 18.5 % — ABNORMAL HIGH (ref 11.5–15.5)
WBC: 11.3 10*3/uL — ABNORMAL HIGH (ref 4.0–10.5)
nRBC: 0.2 % (ref 0.0–0.2)

## 2020-12-02 LAB — CULTURE, BLOOD (ROUTINE X 2): Special Requests: ADEQUATE

## 2020-12-02 LAB — APTT: aPTT: 41 seconds — ABNORMAL HIGH (ref 24–36)

## 2020-12-02 LAB — HEPARIN LEVEL (UNFRACTIONATED): Heparin Unfractionated: >1.1 IU/mL — ABNORMAL HIGH (ref 0.30–0.70)

## 2020-12-02 LAB — GLUCOSE, CAPILLARY: Glucose-Capillary: 135 mg/dL — ABNORMAL HIGH (ref 70–99)

## 2020-12-02 SURGERY — ESOPHAGOGASTRODUODENOSCOPY (EGD) WITH PROPOFOL
Anesthesia: General

## 2020-12-02 MED ORDER — LIDOCAINE VISCOUS HCL 2 % MT SOLN
OROMUCOSAL | Status: DC | PRN
  Administered 2020-12-02: 1 via OROMUCOSAL

## 2020-12-02 MED ORDER — PROPOFOL 10 MG/ML IV BOLUS
INTRAVENOUS | Status: DC | PRN
Start: 2020-12-02 — End: 2020-12-02
  Administered 2020-12-02: 20 mg via INTRAVENOUS
  Administered 2020-12-02: 50 mg via INTRAVENOUS

## 2020-12-02 MED ORDER — HEPARIN (PORCINE) 25000 UT/250ML-% IV SOLN
3000.0000 [IU]/h | INTRAVENOUS | Status: DC
  Administered 2020-12-02: 1600 [IU]/h via INTRAVENOUS
  Administered 2020-12-03: 2000 [IU]/h via INTRAVENOUS
  Administered 2020-12-03: 1800 [IU]/h via INTRAVENOUS
  Administered 2020-12-04 (×2): 2300 [IU]/h via INTRAVENOUS
  Administered 2020-12-05: 2650 [IU]/h via INTRAVENOUS
  Administered 2020-12-05: 2300 [IU]/h via INTRAVENOUS
  Administered 2020-12-06 – 2020-12-07 (×4): 2800 [IU]/h via INTRAVENOUS
  Filled 2020-12-02 (×10): qty 250

## 2020-12-02 MED ORDER — HEPARIN BOLUS VIA INFUSION
4000.0000 [IU] | Freq: Once | INTRAVENOUS | Status: AC
  Administered 2020-12-02: 4000 [IU] via INTRAVENOUS
  Filled 2020-12-02: qty 4000

## 2020-12-02 MED ORDER — SODIUM CHLORIDE 0.9 % IV SOLN: INTRAVENOUS | Status: DC | PRN

## 2020-12-02 MED ORDER — LIDOCAINE HCL (CARDIAC) PF 100 MG/5ML IV SOSY
PREFILLED_SYRINGE | INTRAVENOUS | Status: DC | PRN
  Administered 2020-12-02: 100 mg via INTRATRACHEAL

## 2020-12-02 MED ORDER — PHENYLEPHRINE 40 MCG/ML (10ML) SYRINGE FOR IV PUSH (FOR BLOOD PRESSURE SUPPORT)
PREFILLED_SYRINGE | INTRAVENOUS | Status: DC | PRN
  Administered 2020-12-02 (×2): 120 ug via INTRAVENOUS
  Administered 2020-12-02: 200 ug via INTRAVENOUS
  Administered 2020-12-02 (×2): 80 ug via INTRAVENOUS
  Administered 2020-12-02: 200 ug via INTRAVENOUS

## 2020-12-02 MED ORDER — EPHEDRINE SULFATE-NACL 50-0.9 MG/10ML-% IV SOSY
PREFILLED_SYRINGE | INTRAVENOUS | Status: DC | PRN
  Administered 2020-12-02: 5 mg via INTRAVENOUS

## 2020-12-02 MED ORDER — DEXMEDETOMIDINE (PRECEDEX) IN NS 20 MCG/5ML (4 MCG/ML) IV SYRINGE
PREFILLED_SYRINGE | INTRAVENOUS | Status: DC | PRN
  Administered 2020-12-02: 12 ug via INTRAVENOUS

## 2020-12-02 MED ORDER — SILVER SULFADIAZINE 1 % EX CREA
TOPICAL_CREAM | Freq: Every day | CUTANEOUS | Status: DC
  Filled 2020-12-02: qty 85

## 2020-12-02 NOTE — Anesthesia Postprocedure Evaluation (Signed)
Anesthesia Post Note  Patient: Travis Blair  Procedure(s) Performed: ESOPHAGOGASTRODUODENOSCOPY (EGD) WITH PROPOFOL ESOPHAGEAL DILATION  Patient location during evaluation: PACU Anesthesia Type: General Level of consciousness: awake and alert and oriented Pain management: pain level controlled Vital Signs Assessment: post-procedure vital signs reviewed and stable Respiratory status: spontaneous breathing, nonlabored ventilation, respiratory function stable and patient connected to nasal cannula oxygen Cardiovascular status: blood pressure returned to baseline and stable Postop Assessment: no apparent nausea or vomiting Anesthetic complications: no   No notable events documented.   Last Vitals:  Vitals:   12/02/20 1600 12/02/20 1615  BP: (!) 107/56 (!) 114/55  Pulse: 93 79  Resp: (!) 34 18  Temp:    SpO2: 99% 94%    Last Pain:  Vitals:   12/02/20 1600  TempSrc:   PainSc: 0-No pain                 Delwin Raczkowski C Demisha Nokes

## 2020-12-02 NOTE — Progress Notes (Signed)
ANTICOAGULATION CONSULT NOTE  Pharmacy Consult for Heparin Indication: hx DVT/afib  Allergies  Allergen Reactions   Latex Hives and Rash   Lipitor [Atorvastatin]     Leg aches     Patient Measurements: Height: 6\' 2"  (188 cm) Weight: 113.1 kg (249 lb 5.4 oz) IBW/kg (Calculated) : 82.2 HEPARIN DW (KG): 107.6   Vital Signs: Temp: 98.1 F (36.7 C) (10/22 1545) BP: 114/55 (10/22 1615) Pulse Rate: 79 (10/22 1615)  Labs: Recent Labs    11/30/20 0453 11/30/20 1347 12/01/20 0007 12/01/20 0538 12/01/20 1643 12/02/20 0131 12/02/20 0435  HGB 9.0*  --   --  9.3*  --   --  9.8*  HCT 29.7*  --   --  30.3*  --   --  32.9*  PLT 163  --   --  166  --   --  178  APTT 49*   < > >200*  --  30 41*  --   HEPARINUNFRC >1.10*  --   --   --  >1.10* >1.10*  --   CREATININE 5.11*  --   --  6.35*  --   --   --    < > = values in this interval not displayed.     Estimated Creatinine Clearance: 14.7 mL/min (A) (by C-G formula based on SCr of 6.35 mg/dL (H)).   Assessment: 70 y.o. male with h/o DVT and Afib, Eliquis on hold, for heparin.HL still not correlating with APTT   Goal of Therapy:  APTT 66-102 sec Heparin level 0.3-0.7 units/ml Monitor platelets by anticoagulation protocol: Yes   Plan:  Restart heparin post colonoscopy  Heparin 4000 unit bolus, start heparin @ 1600 units/hr aPTT in 8 hours  Thomasenia Sales, PharmD, MBA, BCGP Clinical Pharmacist  12/02/2020 6:45 PM

## 2020-12-02 NOTE — Progress Notes (Signed)
Physical Therapy Treatment Patient Details Name: Travis Blair MRN: [id number] DOB: April 01, 1950 Today's Date: 12/02/2020   History of Present Illness Travis Blair is a 70 y.o. male with medical history significant of CHF, ERSD on HD (M-W-F), CAD, DVT/PE, and T2DM, CABG with post operative atrial fibrillation, pulmonary hypertension who presented with dyspnea.   Patient reported having cough for about 2 weeks, persistent and worsening.  Tried several antitussive agents at home without improvement of symptoms.  He went to dialysis on Friday, 3 days ago, yesterday he was noted to be dyspneic by his wife, moderate in intensity, associated with cough but no chest pain.  Last night he was noted to have poor balance, and he declined to be brought to the emergency department.  This morning he fell while in the bathroom, landed between the commode and the wall, EMS was called and patient was brought to the hospital.     His main symptom seems to be increased work of breathing/dyspnea, moderate to severe in intensity, worse with exertion, associated with cough, no chest pain, no improving factors.  Last night not able to sleep due to persistent cough, dry.   He continues to make urine 3 times daily.     In June 2022 he decided to stop apixaban due to weakness, he was placed on clopidogrel.  Last year he had COVID-pneumonia and pulm embolism.  About 3 months ago he was started on hemodialysis, he has a fistula which is not mature yet.  He uses a tunneled dialysis catheter for dialysis.    PT Comments    Pt amenable to PT participation with tx initiated performing supine there ex and active-assisted movements for BLE/BUE.  Proceeded with mobility and able to transition supine to sitting requiring max A due to generalized weakness and discomfort along left ribs and elbow from recent hx of falling.  Able to tolerate sitting EOB x 5 min while performing bouts of seated exercise. No adverse reactions  throughout other than fatigue.  Unable to scoot EOB and attempt at standing unsuccessful due to weakness/fatigue.  Max A required for returning to bed in supine.  Demonstrates generalized weakness and reduced functional activity tolerance.  Continued sessions indicated to improve strength and functional mobility to reduce level of assistance from caregivers. Recommend SNF for D/C to continue with rehabilitation.   Recommendations for follow up therapy are one component of a multi-disciplinary discharge planning process, led by the attending physician.  Recommendations may be updated based on patient status, additional functional criteria and insurance authorization.  Follow Up Recommendations  SNF     Equipment Recommendations  None recommended by PT    Recommendations for Other Services       Precautions / Restrictions Restrictions Weight Bearing Restrictions: No     Mobility  Bed Mobility Overal bed mobility: Needs Assistance Bed Mobility: Supine to Sit;Sit to Supine;Rolling Rolling: Mod assist;Max assist   Supine to sit: Max assist Sit to supine: Max assist   General bed mobility comments: slow labored movement, required assistance for lifting legs onto the bed Patient Response: Cooperative  Transfers Overall transfer level: Needs assistance Equipment used: Rolling walker (2 wheeled) Transfers: Sit to/from Stand Sit to Stand: Max assist         General transfer comment: Using RW with slow labored movement.  Ambulation/Gait                 Stairs  Wheelchair Mobility    Modified Rankin (Stroke Patients Only)       Balance Overall balance assessment: Needs assistance Sitting-balance support: Feet supported;Single extremity supported Sitting balance-Leahy Scale: Fair Sitting balance - Comments: at EOB   Standing balance support: Bilateral upper extremity supported;During functional activity Standing balance-Leahy Scale:  Poor Standing balance comment: poor using RW                            Cognition Arousal/Alertness: Lethargic Behavior During Therapy: WFL for tasks assessed/performed Overall Cognitive Status: Within Functional Limits for tasks assessed                                        Exercises General Exercises - Lower Extremity Ankle Circles/Pumps: Strengthening;Both;20 reps Short Arc Quad: Strengthening;Both;15 reps Heel Slides: AAROM;Both;20 reps Straight Leg Raises: Strengthening;Both;5 reps    General Comments        Pertinent Vitals/Pain Pain Assessment: Faces Faces Pain Scale: Hurts even more Pain Location: right elbow, left hip/groin with movement Pain Descriptors / Indicators: Sore Pain Intervention(s): Limited activity within patient's tolerance;Monitored during session    Home Living                      Prior Function            PT Goals (current goals can now be found in the care plan section) Acute Rehab PT Goals Patient Stated Goal: Pt open to rehab. PT Goal Formulation: With patient/family Time For Goal Achievement: 12/13/20 Potential to Achieve Goals: Fair Progress towards PT goals: Progressing toward goals    Frequency    Min 3X/week      PT Plan Current plan remains appropriate    Co-evaluation              AM-PAC PT "6 Clicks" Mobility   Outcome Measure  Help needed turning from your back to your side while in a flat bed without using bedrails?: A Lot Help needed moving from lying on your back to sitting on the side of a flat bed without using bedrails?: A Lot Help needed moving to and from a bed to a chair (including a wheelchair)?: A Lot Help needed standing up from a chair using your arms (e.g., wheelchair or bedside chair)?: A Lot Help needed to walk in hospital room?: Total Help needed climbing 3-5 steps with a railing? : Total 6 Click Score: 10    End of Session Equipment Utilized During  Treatment: Gait belt Activity Tolerance: Patient limited by fatigue Patient left: in bed;with call bell/phone within reach;with family/visitor present Nurse Communication: Mobility status PT Visit Diagnosis: Unsteadiness on feet (R26.81);Other abnormalities of gait and mobility (R26.89);Muscle weakness (generalized) (M62.81)     Time: 4268-3419 PT Time Calculation (min) (ACUTE ONLY): 30 min  Charges:  $Therapeutic Exercise: 8-22 mins $Therapeutic Activity: 8-22 mins                     11:30 AM, 12/02/20 M. Sherlyn Lees, PT, DPT Physical Therapist- [place] Office Number: [phone]

## 2020-12-02 NOTE — Progress Notes (Signed)
PROGRESS NOTE  Travis Blair ONG:[id number] DOB: 1950-04-01 DOA: 11/27/2020 PCP: [name], MD   Brief History:   70 y.o. male with medical history significant of CHF, ERSD on HD (M-W-F), CAD, DVT/PE, and T2DM, CABG with post operative atrial fibrillation, pulmonary hypertension who presented with dyspnea.  Patient reported having cough for about 2 weeks, persistent and worsening.  Tried several antitussive agents at home without improvement of symptoms.  He went to dialysis on Friday, oct 14. He e was noted to be dyspneic by his wife, moderate in intensity, associated with cough.  He has had a chronic cough for at least one year, worse over last week prior to [place].  He took some robitussin and codeine from an old prescription on 11/26/20.  On Monday, he was more unsteady and fell in the BR.  He had subjective f/c at home since 11/24/20.  He denied n/v/d, abd pain.     His main symptom seems to be increased work of breathing/dyspnea, moderate to severe in intensity, worse with exertion, associated with cough, no chest pain, no improving factors. Last night not able to sleep due to persistent cough, dry.  He continues to make urine 3 times daily.   In June 2022 he decided to stop apixaban due to weakness, he was placed on clopidogrel. Last year he had COVID-pneumonia and pulm embolism. About 3 months ago he was started on hemodialysis, he has a fistula which is not mature yet.  He uses a tunneled dialysis catheter for dialysis.  Since admission, nephrology was consulted to assist.  He was dialyzed on 10/17 and 10/19.  He was found to have MRSA bacteremia.  General surgery was consulted to remove his Permacath.  He was started on IV vancomycin 11/29/20.  Repeat blood culture on 10/20 showed Ecoli, and he was started on cefepime.   Assessment/Plan: Sepsis  -present on admission -had fever and leukocytosis -due to MRSA bacteremia -sepsis physiology resolved   MRSA  bacteremia -removed tunneled HD catheter 10/19 after HD -follow surveillance blood cultures -temporary vascath planned 10/21 if cannot use fistula -able to to use fistula for HD 10/21 -consult cardiology for TEE>>tentatively scheduled for 10/24   [facility] Bacteremia -continue cefepime on HD>>plan to switch to cefazolin on HD based on susceptibilities -10/20 blood culture = EColi--no MRSA   Right Diabetic foot ulcerAcute Osteomyelitis 5th metatarsal -spouse states wound is improving with care at home -MR right foot--Soft tissue ulceration along the lateral forefoot near the fifth metatarsal head with underlying 2.3 cm curvilinear fluid collection,concerning for abscess -consult wound care -discussed with surgery--foot debrided>>no pus   Acute respiratory failure with hypoxia -due to pulmonary edema -presented with oxygen saturation 87% on RA and tachypnea -stable on 2L -wean off oxygen for saturation >92%   Acute on chronic diastolic CHF/Acute systolic CHF -4/40/10 Echo 60-65%, no WMA; RV systolic function preserved.  RSVP 57.3.  No significant valvular disease. -fluid removal on HD -11/28/20 Echo EF 45-50%+WMA, RV overload -stable on 2L  Dysphagia -12/02/20 EGD--no esophageal abnormality to explain dysphagia; dilated; mild portal hypertensive gastropathy -suspect he has esophageal dysmotility from his medical issues   ESRD -appreciate renal consult -HD on 10/17, 10/19, 10/21 -10/21--able to use fistula   History DVT/PE -hold apixaban temporarily in preparation for new catheter insertion   Chronic Afib -AC as discussed above -continue IV heparin   Uncontrolled Diabetes mellitus type 2 with hyperglycemia -08/18/20 A1C--8.0 -CBGs controlled so  far -novolog sliding scale -10/19- A1C 6.3   Right Elbow pain -due to recent fall on 11/27/20 -xray--no fracture; small anterior joint eff   Transaminasemia -likely due to sepsis and hemodynamic changes -repeat LFTs in am        Status is: Inpatient   Remains inpatient appropriate because: severity of illness               Family Communication:   spouse updated at bedside 10/22   Consultants:  renal, general surgery   Code Status:  FULL   DVT Prophylaxis:  IV Heparin      Procedures: As Listed in Progress Note Above   Antibiotics: Vanco 10/19>> Cefepime 10/21>>     Total time spent 35 minutes.  Greater than 50% spent face to face counseling and coordinating care.   Subjective: Total time spent 35 minutes.  Greater than 50% spent face to face counseling and coordinating care.   Objective: Vitals:   12/02/20 1413 12/02/20 1545 12/02/20 1600 12/02/20 1615  BP: 133/78 (!) 111/55 (!) 107/56 (!) 114/55  Pulse: 83 83 93 79  Resp: 18 (!) 26 (!) 34 18  Temp: 97.9 F (36.6 C) 98.1 F (36.7 C)    TempSrc:      SpO2: 99% 98% 99% 94%  Weight:      Height:        Intake/Output Summary (Last 24 hours) at 12/02/2020 1735 Last data filed at 12/02/2020 1600 Gross per 24 hour  Intake 863.13 ml  Output --  Net 863.13 ml   Weight change: -1.74 kg Exam:  General:  Pt is alert, follows commands appropriately, not in acute distress HEENT: No icterus, No thrush, No neck mass, [place]/AT Cardiovascular: RRR, S1/S2, no rubs, no gallops Respiratory: CTA bilaterally, no wheezing, no crackles, no rhonchi Abdomen: Soft/+BS, non tender, non distended, no guarding Extremities: No edema, No lymphangitis, No petechiae, No rashes, no synovitis   Data Reviewed: I have personally reviewed following labs and imaging studies Basic Metabolic Panel: Recent Labs  Lab 11/27/20 1711 11/28/20 0449 11/29/20 0440 11/30/20 0453 12/01/20 0538  NA 135 135 131* 133* 130*  K 3.8 3.9 4.1 3.9 4.3  CL 100 99 95* 97* 93*  CO2 24 26 26 25 24   GLUCOSE 178* 126* 138* 120* 162*  BUN 74* 41* 60* 49* 69*  CREATININE 6.75* 4.38* 6.10* 5.11* 6.35*  CALCIUM 8.0* 8.1* 7.9* 8.0* 8.4*  PHOS 4.4  --   --  3.2 3.9   Liver  Function Tests: Recent Labs  Lab 11/27/20 1711 11/30/20 0453 12/01/20 0538 12/02/20 0435  AST  --   --   --  347*  ALT  --   --   --  130*  ALKPHOS  --   --   --  174*  BILITOT  --   --   --  1.6*  PROT  --   --   --  6.9  ALBUMIN 2.6* 2.5* 2.5* 3.1*   No results for input(s): LIPASE, AMYLASE in the last 168 hours. No results for input(s): AMMONIA in the last 168 hours. Coagulation Profile: No results for input(s): INR, PROTIME in the last 168 hours. CBC: Recent Labs  Lab 11/28/20 0449 11/29/20 0440 11/30/20 0453 12/01/20 0538 12/02/20 0435  WBC 14.7* 15.0* 14.0* 14.1* 11.3*  HGB 10.0* 9.4* 9.0* 9.3* 9.8*  HCT 32.4* 31.9* 29.7* 30.3* 32.9*  MCV 90.3 92.7 90.8 90.4 91.4  PLT 185 162 163 166 178   Cardiac Enzymes:  No results for input(s): CKTOTAL, CKMB, CKMBINDEX, TROPONINI in the last 168 hours. BNP: Invalid input(s): POCBNP CBG: Recent Labs  Lab 11/27/20 2216 11/29/20 2009 12/02/20 1544  GLUCAP 128* 130* 135*   HbA1C: Recent Labs    11/30/20 0453  HGBA1C 6.3*   Urine analysis:    Component Value Date/Time   COLORURINE YELLOW 03/10/2017 1553   APPEARANCEUR HAZY (A) 03/10/2017 1553   LABSPEC 1.013 03/10/2017 1553   PHURINE 5.0 03/10/2017 1553   GLUCOSEU NEGATIVE 03/10/2017 1553   HGBUR NEGATIVE 03/10/2017 1553   BILIRUBINUR NEGATIVE 03/10/2017 1553   KETONESUR NEGATIVE 03/10/2017 1553   PROTEINUR 30 (A) 03/10/2017 1553   NITRITE NEGATIVE 03/10/2017 1553   LEUKOCYTESUR NEGATIVE 03/10/2017 1553   Sepsis Labs: @LABRCNTIP (procalcitonin:4,lacticidven:4) ) Recent Results (from the past 240 hour(s))  Resp Panel by RT-PCR (Flu A&B, Covid) Nasopharyngeal Swab     Status: None   Collection Time: 11/27/20 10:09 AM   Specimen: Nasopharyngeal Swab; Nasopharyngeal(NP) swabs in vial transport medium  Result Value Ref Range Status   SARS Coronavirus 2 by RT PCR NEGATIVE NEGATIVE Final    Comment: (NOTE) SARS-CoV-2 target nucleic acids are NOT DETECTED.  The  SARS-CoV-2 RNA is generally detectable in upper respiratory specimens during the acute phase of infection. The lowest concentration of SARS-CoV-2 viral copies this assay can detect is 138 copies/mL. A negative result does not preclude SARS-Cov-2 infection and should not be used as the sole basis for treatment or other patient management decisions. A negative result may occur with  improper specimen collection/handling, submission of specimen other than nasopharyngeal swab, presence of viral mutation(s) within the areas targeted by this assay, and inadequate number of viral copies(<138 copies/mL). A negative result must be combined with clinical observations, patient history, and epidemiological information. The expected result is Negative.  Fact Sheet for Patients:  EntrepreneurPulse.com.au  Fact Sheet for Healthcare Providers:  IncredibleEmployment.be  This test is no t yet approved or cleared by the [place] FDA and  has been authorized for detection and/or diagnosis of SARS-CoV-2 by FDA under an Emergency Use Authorization (EUA). This EUA will remain  in effect (meaning this test can be used) for the duration of the COVID-19 declaration under Section 564(b)(1) of the Act, 21 U.S.C.section 360bbb-3(b)(1), unless the authorization is terminated  or revoked sooner.       Influenza A by PCR NEGATIVE NEGATIVE Final   Influenza B by PCR NEGATIVE NEGATIVE Final    Comment: (NOTE) The Xpert Xpress SARS-CoV-2/FLU/RSV plus assay is intended as an aid in the diagnosis of influenza from Nasopharyngeal swab specimens and should not be used as a sole basis for treatment. Nasal washings and aspirates are unacceptable for Xpert Xpress SARS-CoV-2/FLU/RSV testing.  Fact Sheet for Patients: EntrepreneurPulse.com.au  Fact Sheet for Healthcare Providers: IncredibleEmployment.be  This test is not yet approved or  cleared by the [place] FDA and has been authorized for detection and/or diagnosis of SARS-CoV-2 by FDA under an Emergency Use Authorization (EUA). This EUA will remain in effect (meaning this test can be used) for the duration of the COVID-19 declaration under Section 564(b)(1) of the Act, 21 U.S.C. section 360bbb-3(b)(1), unless the authorization is terminated or revoked.  Performed at [facility], [address], [place], [place] [address]   MRSA Next Gen by PCR, Nasal     Status: Abnormal   Collection Time: 11/27/20  4:10 PM   Specimen: Nasal Mucosa; Nasal Swab  Result Value Ref Range Status   MRSA by PCR  Next Gen DETECTED (A) NOT DETECTED Final    Comment: RESULT CALLED TO, READ BACK BY AND VERIFIED WITH: HEATHER TETREAULT,1951,11/27/2020,SELF S. (NOTE) The GeneXpert MRSA Assay (FDA approved for NASAL specimens only), is one component of a comprehensive MRSA colonization surveillance program. It is not intended to diagnose MRSA infection nor to guide or monitor treatment for MRSA infections. Test performance is not FDA approved in patients less than 105 years old. Performed at [facility], [address] [place], [place] [address]   Culture, blood (Routine X 2) w Reflex to ID Panel     Status: Abnormal   Collection Time: 11/28/20  9:34 AM   Specimen: BLOOD RIGHT HAND  Result Value Ref Range Status   Specimen Description   Final    BLOOD RIGHT HAND Performed at [facility], [address] [place] [address], [place], [place] [address]    Special Requests   Final    Immunocompromised Blood Culture adequate volume BOTTLES DRAWN AEROBIC AND ANAEROBIC Performed at [facility], [address] [place], [place] [address]    Culture  Setup Time   Final    GRAM POSITIVE COCCI IN BOTH AEROBIC AND ANAEROBIC BOTTLES Gram Stain Report Called to,Read Back By and Verified WithLynwood Dawley, 2216, 11/28/2020,SELF S aerobic bottle previously called 10.18.22 2353 matthews, b  Performed at [facility], [address], [place], [place] [address]    Culture (A)  Final    STAPHYLOCOCCUS AUREUS SUSCEPTIBILITIES PERFORMED ON PREVIOUS CULTURE WITHIN THE LAST 5 DAYS. Performed at [facility], [place] [address] [place], [place] [address]    Report Status 12/02/2020 FINAL  Final  Culture, blood (Routine X 2) w Reflex to ID Panel     Status: Abnormal   Collection Time: 11/28/20  9:34 AM   Specimen: BLOOD RIGHT FOREARM  Result Value Ref Range Status   Specimen Description   Final    BLOOD RIGHT FOREARM Performed at [facility], [address] [place], [place] [address]    Special Requests   Final    Immunocompromised Blood Culture adequate volume BOTTLES DRAWN AEROBIC AND ANAEROBIC Performed at [facility], [address], [place], [place] [address]    Culture  Setup Time   Final    GRAM POSITIVE COCCI IN BOTH AEROBIC AND ANAEROBIC BOTTLES Gram Stain Report Called to,Read Back By and Verified With: T.ALLEN,2236,11/28/2020,SELF S aerobic bottle gpc previously called matthews, b 10.18.22 2355 CRITICAL RESULT CALLED TO, READ BACK BY AND VERIFIED WITH: T ALLEN,RN@0209  11/29/20 [place]    Culture (A)  Final    METHICILLIN RESISTANT STAPHYLOCOCCUS AUREUS VIRIDANS STREPTOCOCCUS THE SIGNIFICANCE OF ISOLATING THIS ORGANISM FROM A SINGLE SET OF BLOOD CULTURES WHEN MULTIPLE SETS ARE DRAWN IS UNCERTAIN. PLEASE NOTIFY THE MICROBIOLOGY DEPARTMENT WITHIN ONE WEEK IF SPECIATION AND SENSITIVITIES ARE REQUIRED. Performed at [facility], [place] [address] [place], [place] [address]    Report Status 12/02/2020 FINAL  Final   Organism ID, Bacteria METHICILLIN RESISTANT STAPHYLOCOCCUS AUREUS  Final      Susceptibility   Methicillin resistant staphylococcus aureus - MIC*    CIPROFLOXACIN >=8 RESISTANT Resistant     ERYTHROMYCIN >=8 RESISTANT Resistant     GENTAMICIN <=0.5 SENSITIVE Sensitive     OXACILLIN >=4 RESISTANT Resistant     TETRACYCLINE <=1 SENSITIVE Sensitive     VANCOMYCIN 1 SENSITIVE  Sensitive     TRIMETH/SULFA <=10 SENSITIVE Sensitive     CLINDAMYCIN >=8 RESISTANT Resistant     RIFAMPIN <=0.5 SENSITIVE Sensitive  Inducible Clindamycin NEGATIVE Sensitive     * METHICILLIN RESISTANT STAPHYLOCOCCUS AUREUS  Blood Culture ID Panel (Reflexed)     Status: Abnormal   Collection Time: 11/28/20  9:34 AM  Result Value Ref Range Status   Enterococcus faecalis NOT DETECTED NOT DETECTED Final   Enterococcus Faecium NOT DETECTED NOT DETECTED Final   Listeria monocytogenes NOT DETECTED NOT DETECTED Final   Staphylococcus species DETECTED (A) NOT DETECTED Final    Comment: CRITICAL RESULT CALLED TO, READ BACK BY AND VERIFIED WITH: T ALLEN,RN@0209  11/29/20 [place]    Staphylococcus aureus (BCID) DETECTED (A) NOT DETECTED Final    Comment: Methicillin (oxacillin)-resistant Staphylococcus aureus (MRSA). MRSA is predictably resistant to beta-lactam antibiotics (except ceftaroline). Preferred therapy is vancomycin unless clinically contraindicated. Patient requires contact precautions if  hospitalized. CRITICAL RESULT CALLED TO, READ BACK BY AND VERIFIED WITH: T ALLEN,RN@0209  11/29/20 [place]    Staphylococcus epidermidis NOT DETECTED NOT DETECTED Final   Staphylococcus lugdunensis NOT DETECTED NOT DETECTED Final   Streptococcus species DETECTED (A) NOT DETECTED Final    Comment: Not Enterococcus species, Streptococcus agalactiae, Streptococcus pyogenes, or Streptococcus pneumoniae. CRITICAL RESULT CALLED TO, READ BACK BY AND VERIFIED WITH: T ALLEN,RN@ 0209 11/29/20 [place]    Streptococcus agalactiae NOT DETECTED NOT DETECTED Final   Streptococcus pneumoniae NOT DETECTED NOT DETECTED Final   Streptococcus pyogenes NOT DETECTED NOT DETECTED Final   A.calcoaceticus-baumannii NOT DETECTED NOT DETECTED Final   Bacteroides fragilis NOT DETECTED NOT DETECTED Final   Enterobacterales NOT DETECTED NOT DETECTED Final   Enterobacter cloacae complex NOT DETECTED NOT DETECTED Final   Escherichia coli  NOT DETECTED NOT DETECTED Final   Klebsiella aerogenes NOT DETECTED NOT DETECTED Final   Klebsiella oxytoca NOT DETECTED NOT DETECTED Final   Klebsiella pneumoniae NOT DETECTED NOT DETECTED Final   Proteus species NOT DETECTED NOT DETECTED Final   Salmonella species NOT DETECTED NOT DETECTED Final   Serratia marcescens NOT DETECTED NOT DETECTED Final   Haemophilus influenzae NOT DETECTED NOT DETECTED Final   Neisseria meningitidis NOT DETECTED NOT DETECTED Final   Pseudomonas aeruginosa NOT DETECTED NOT DETECTED Final   Stenotrophomonas maltophilia NOT DETECTED NOT DETECTED Final   Candida albicans NOT DETECTED NOT DETECTED Final   Candida auris NOT DETECTED NOT DETECTED Final   Candida glabrata NOT DETECTED NOT DETECTED Final   Candida krusei NOT DETECTED NOT DETECTED Final   Candida parapsilosis NOT DETECTED NOT DETECTED Final   Candida tropicalis NOT DETECTED NOT DETECTED Final   Cryptococcus neoformans/gattii NOT DETECTED NOT DETECTED Final   Meth resistant mecA/C and MREJ DETECTED (A) NOT DETECTED Final    Comment: CRITICAL RESULT CALLED TO, READ BACK BY AND VERIFIED WITH: T ALLEN,RN@0209  11/29/20 [place] Performed at [facility], [address], [place], [place] [address]   Culture, blood (Routine X 2) w Reflex to ID Panel     Status: Abnormal   Collection Time: 11/30/20  4:53 AM   Specimen: BLOOD RIGHT HAND  Result Value Ref Range Status   Specimen Description   Final    BLOOD RIGHT HAND Performed at [facility], [address], [place], [place] [address]    Special Requests   Final    BOTTLES DRAWN AEROBIC AND ANAEROBIC Blood Culture adequate volume Performed at [facility], [address] [place], [place] [address]    Culture  Setup Time   Final    GRAM NEGATIVE RODS AEROBIC BOTTLE ONLY Gram Stain Report Called to,Read Back By and Verified With: EVANS T.,2003,11/30/2020,SELF  S CRITICAL RESULT CALLED TO, READ BACK BY AND VERIFIED WITH: [facility] PHARMD @1216  12/01/20  EB Performed at [place] [address], [place], [place] [address]    Culture ESCHERICHIA COLI (A)  Final   Report Status 12/02/2020 FINAL  Final   Organism ID, Bacteria ESCHERICHIA COLI  Final      Susceptibility   Escherichia coli - MIC*    AMPICILLIN >=32 RESISTANT Resistant     CEFAZOLIN <=4 SENSITIVE Sensitive     CEFEPIME <=0.12 SENSITIVE Sensitive     CEFTAZIDIME <=1 SENSITIVE Sensitive     CEFTRIAXONE <=0.25 SENSITIVE Sensitive     CIPROFLOXACIN <=0.25 SENSITIVE Sensitive     GENTAMICIN >=16 RESISTANT Resistant     IMIPENEM <=0.25 SENSITIVE Sensitive     TRIMETH/SULFA >=320 RESISTANT Resistant     AMPICILLIN/SULBACTAM >=32 RESISTANT Resistant     PIP/TAZO <=4 SENSITIVE Sensitive     * ESCHERICHIA COLI  Culture, blood (Routine X 2) w Reflex to ID Panel     Status: None (Preliminary result)   Collection Time: 11/30/20  4:53 AM   Specimen: BLOOD RIGHT ARM  Result Value Ref Range Status   Specimen Description BLOOD RIGHT ARM  Final   Special Requests   Final    BOTTLES DRAWN AEROBIC AND ANAEROBIC Blood Culture results may not be optimal due to an excessive volume of blood received in culture bottles   Culture   Final    NO GROWTH 2 DAYS Performed at [facility], [address] [place],  [address]    Report Status PENDING  Incomplete  Blood Culture ID Panel (Reflexed)     Status: Abnormal   Collection Time: 11/30/20  4:53 AM  Result Value Ref Range Status   Enterococcus faecalis NOT DETECTED NOT DETECTED Final   Enterococcus Faecium NOT DETECTED NOT DETECTED Final   Listeria monocytogenes NOT DETECTED NOT DETECTED Final   Staphylococcus species NOT DETECTED NOT DETECTED Final   Staphylococcus aureus (BCID) NOT DETECTED NOT DETECTED Final   Staphylococcus epidermidis NOT DETECTED NOT DETECTED Final   Staphylococcus lugdunensis NOT DETECTED NOT DETECTED Final   Streptococcus species NOT DETECTED NOT DETECTED Final   Streptococcus agalactiae NOT DETECTED  NOT DETECTED Final   Streptococcus pneumoniae NOT DETECTED NOT DETECTED Final   Streptococcus pyogenes NOT DETECTED NOT DETECTED Final   A.calcoaceticus-baumannii NOT DETECTED NOT DETECTED Final   Bacteroides fragilis NOT DETECTED NOT DETECTED Final   Enterobacterales DETECTED (A) NOT DETECTED Final    Comment: Enterobacterales represent a large order of gram negative bacteria, not a single organism. CRITICAL RESULT CALLED TO, READ BACK BY AND VERIFIED WITH: S,HURTH PHARMD @1216  12/01/20 [place]    Enterobacter cloacae complex NOT DETECTED NOT DETECTED Final   Escherichia coli DETECTED (A) NOT DETECTED Final    Comment: CRITICAL RESULT CALLED TO, READ BACK BY AND VERIFIED WITH: S,HURTH PHARMD @1216  12/01/20 [place]    Klebsiella aerogenes NOT DETECTED NOT DETECTED Final   Klebsiella oxytoca NOT DETECTED NOT DETECTED Final   Klebsiella pneumoniae NOT DETECTED NOT DETECTED Final   Proteus species NOT DETECTED NOT DETECTED Final   Salmonella species NOT DETECTED NOT DETECTED Final   Serratia marcescens NOT DETECTED NOT DETECTED Final   Haemophilus influenzae NOT DETECTED NOT DETECTED Final   Neisseria meningitidis NOT DETECTED NOT DETECTED Final   Pseudomonas aeruginosa NOT DETECTED NOT DETECTED Final   Stenotrophomonas maltophilia NOT DETECTED NOT DETECTED Final   Candida albicans NOT DETECTED NOT DETECTED Final  Candida auris NOT DETECTED NOT DETECTED Final   Candida glabrata NOT DETECTED NOT DETECTED Final   Candida krusei NOT DETECTED NOT DETECTED Final   Candida parapsilosis NOT DETECTED NOT DETECTED Final   Candida tropicalis NOT DETECTED NOT DETECTED Final   Cryptococcus neoformans/gattii NOT DETECTED NOT DETECTED Final   CTX-M ESBL NOT DETECTED NOT DETECTED Final   Carbapenem resistance IMP NOT DETECTED NOT DETECTED Final   Carbapenem resistance KPC NOT DETECTED NOT DETECTED Final   Carbapenem resistance NDM NOT DETECTED NOT DETECTED Final   Carbapenem resist OXA 48 LIKE NOT DETECTED  NOT DETECTED Final   Carbapenem resistance VIM NOT DETECTED NOT DETECTED Final    Comment: Performed at [facility] [address], [place], [place] [address]  Culture, blood (Routine X 2) w Reflex to ID Panel     Status: None (Preliminary result)   Collection Time: 12/01/20 12:46 PM   Specimen: BLOOD  Result Value Ref Range Status   Specimen Description   Final    BLOOD DRAWN BY DIALYSIS DRAWN BY RN Performed at [facility], [address], [place], [place] [address]    Special Requests   Final    BOTTLES DRAWN AEROBIC AND ANAEROBIC Blood Culture adequate volume Performed at [facility], [address], [place], [place] [address]    Culture  Setup Time   Final    GRAM POSITIVE COCCI both anaerobic and aerobic bottles Gram Stain Report Called to,Read Back By and Verified With: Rhew,J@0545  by Matthews,B 10.22.22 Performed at [facility], [address], [place], [place] [address]    Culture GRAM POSITIVE COCCI  Final   Report Status PENDING  Incomplete  Culture, blood (Routine X 2) w Reflex to ID Panel     Status: None (Preliminary result)   Collection Time: 12/01/20 12:55 PM   Specimen: BLOOD  Result Value Ref Range Status   Specimen Description BLOOD DRAWN BY DIALYSIS DRAWN BY RN  Final   Special Requests   Final    BOTTLES DRAWN AEROBIC AND ANAEROBIC Blood Culture adequate volume   Culture  Setup Time   Final    GRAM POSITIVE COCCI ANAEROBIC BOTTLE ONLY Gram Stain Report Called to,Read Back By and Verified With: HUMPHRIES @ 1600 ON 102222 BY HENDERSON L    Culture   Final    NO GROWTH < 24 HOURS Performed at [facility], [address], [place], [place] [address]    Report Status PENDING  Incomplete     Scheduled Meds:  calcitRIOL  0.5 mcg Oral Q M,W,F   calcium acetate  1,334 mg Oral TID with meals   Chlorhexidine Gluconate Cloth  6 each Topical Q0600   loratadine  10 mg Oral q1800   mouth rinse  15 mL Mouth Rinse BID   mupirocin ointment   Nasal BID    pantoprazole  40 mg Oral Daily   silver sulfADIAZINE   Topical Daily   Continuous Infusions:  ceFEPime (MAXIPIME) IV Stopped (12/01/20 1559)   vancomycin Stopped (12/01/20 1505)    Procedures/Studies: DG Elbow 2 Views Right  Result Date: 11/29/2020 CLINICAL DATA:  Pain and swelling in the right elbow lung recent fall, initial encounter EXAM: RIGHT ELBOW - 2 VIEW COMPARISON:  None. FINDINGS: Mild degenerative changes of the right elbow joint are seen. No acute fracture or dislocation is noted. Small joint effusion is noted anteriorly. Olecranon spurring is seen. IMPRESSION: Small joint effusion without acute fracture. Electronically Signed   By: Elta [place]  Lukens M.D.   On: 11/29/2020 18:58   MR FOOT RIGHT WO CONTRAST  Result Date: 12/01/2020 CLINICAL DATA:  Right foot ulcer. EXAM: MRI OF THE RIGHT FOREFOOT WITHOUT CONTRAST TECHNIQUE: Multiplanar, multisequence MR imaging of the right forefoot was performed. No intravenous contrast was administered. COMPARISON:  Right foot x-rays dated November 27, 2020. FINDINGS: Bones/Joint/Cartilage Faint marrow edema the fifth metatarsal head. Remaining marrow signal is normal. No fracture or dislocation. Mild second TMT joint osteoarthritis. No joint effusion. Ligaments Collateral ligaments are intact.  Lisfranc ligament is intact. Muscles and Tendons Flexor and extensor tendons are intact. Increased T2 signal within the intrinsic muscles of the forefoot, nonspecific, but likely related to diabetic muscle changes. Soft tissue Diffuse soft tissue swelling. Soft tissue ulceration along the lateral forefoot near the fifth metatarsal head with underlying 2.1 x 0.4 x 2.3 cm curvilinear fluid collection (series 8, image 26). No soft tissue mass. IMPRESSION: 1. Soft tissue ulceration along the lateral forefoot near the fifth metatarsal head with underlying 2.3 cm curvilinear fluid collection, concerning for abscess. 2. Faint marrow edema in the fifth metatarsal head is  nonspecific, but concerning for early osteomyelitis given adjacent abscess and soft tissue ulcer. Electronically Signed   By: Titus Dubin M.D.   On: 12/01/2020 14:16   DG Chest Port 1 View  Result Date: 11/29/2020 CLINICAL DATA:  Removal of Port-A-Cath EXAM: PORTABLE CHEST 1 VIEW COMPARISON:  11/27/2020 FINDINGS: There is been interval removal of hemodialysis catheter on the right. No visible retained fragments. Prior CABG. Cardiomegaly. Vascular congestion. Bilateral layering effusions and lower lobe airspace opacity. Probable mild edema/CHF. IMPRESSION: Interval removal of right hemodialysis catheter. CHF.  Layering effusions. Electronically Signed   By: Rolm Baptise M.D.   On: 11/29/2020 16:58   DG Chest Port 1 View  Result Date: 11/27/2020 CLINICAL DATA:  Fall.  Chest and right foot pain. EXAM: PORTABLE CHEST 1 VIEW COMPARISON:  09/01/2020 FINDINGS: Unchanged mild cardiomegaly. Unchanged mild pulmonary vascular congestion. Small bilateral pleural effusions are not significantly changed since prior examination. Interval placement of dialysis catheter. Median sternotomy changes again noted. IMPRESSION: No acute abnormality of the chest. Unchanged findings of mild CHF/fluid volume overload. Electronically Signed   By: Miachel Roux M.D.   On: 11/27/2020 11:24   DG Foot 2 Views Right  Result Date: 11/27/2020 CLINICAL DATA:  wound to lateral fore foot EXAM: RIGHT FOOT - 2 VIEW COMPARISON:  Radiograph 08/18/2020 FINDINGS: There is no evidence of acute fracture or dislocation. Unchanged bulky ossification along the base of the fifth metatarsal, possibly from prior trauma. There is a lateral forefoot soft tissue defect. There is no frank bony destruction radiographically. Mild midfoot and forefoot degenerative changes. Plantar and dorsal calcaneal spurring. Vascular calcifications. IMPRESSION: Lateral forefoot soft tissue defect. No frank bony destruction radiographically to suggest osteomyelitis. MRI  would be more sensitive. Electronically Signed   By: Maurine Simmering M.D.   On: 11/27/2020 11:32   ECHOCARDIOGRAM LIMITED  Result Date: 11/28/2020    ECHOCARDIOGRAM LIMITED REPORT   Patient Name:   Travis Blair Date of Exam: 11/28/2020 Medical Rec #:  [id number]           Height:       74.0 in Accession #:    [id number]          Weight:       261.9 lb Date of Birth:  Jun 20, 1950           BSA:  2.437 m Patient Age:    41 years            BP:           124/56 mmHg Patient Gender: M                   HR:           72 bpm. Exam Location:  [facility] Procedure: Limited Echo, Color Doppler and Cardiac Doppler Indications:    Dyspnea R06.00  History:        Patient has prior history of Echocardiogram examinations, most                 recent 06/06/2020. CAD, Prior CABG, Arrythmias:Atrial                 Fibrillation, Signs/Symptoms:Shortness of Breath; Risk                 Factors:Hypertension, Diabetes and Dyslipidemia.  Sonographer:    Alvino Chapel RCS Referring Phys: [id number] [place]  1. Left ventricular ejection fraction, by estimation, is 45 to 50%. The left ventricle has mildly decreased function. The left ventricle demonstrates regional wall motion abnormalities (see scoring diagram/findings for description). There is severe asymmetric left ventricular hypertrophy of the basal and septal segments. Left ventricular diastolic parameters are indeterminate. There is the interventricular septum is flattened in systole and diastole, consistent with right ventricular pressure and volume overload.  2. Right ventricular systolic function is low normal. The right ventricular size is mildly enlarged. There is moderately elevated pulmonary artery systolic pressure. The estimated right ventricular systolic pressure is 93.7 mmHg.  3. Left atrial size was mildly dilated.  4. Right atrial size was mildly dilated.  5. Left pleural effusion evident. A small pericardial effusion is present. The  pericardial effusion is posterior to the left ventricle.  6. The mitral valve is abnormal. Mild mitral valve regurgitation.  7. The aortic valve is tricuspid. There is moderate calcification of the aortic valve. Aortic valve regurgitation is not visualized.  8. The inferior vena cava is dilated in size with <50% respiratory variability, suggesting right atrial pressure of 15 mmHg. Comparison(s): Prior images reviewed side by side. Reduced LVEF and wall motion abnormalities are not new based on my review of the prior study. FINDINGS  Left Ventricle: Left ventricular ejection fraction, by estimation, is 45 to 50%. The left ventricle has mildly decreased function. The left ventricle demonstrates regional wall motion abnormalities. Definity contrast agent was given IV to delineate the left ventricular endocardial borders. There is severe asymmetric left ventricular hypertrophy of the basal and septal segments. The interventricular septum is flattened in systole and diastole, consistent with right ventricular pressure and volume overload. Left ventricular diastolic parameters are indeterminate. Left ventricular diastolic function could not be evaluated due to atrial fibrillation.  LV Wall Scoring: The basal inferior segment is akinetic. The posterior wall, mid inferoseptal segment, mid inferior segment, and basal inferoseptal segment are hypokinetic. The entire anterior wall, antero-lateral wall, entire anterior septum, and entire apex are normal. Right Ventricle: The right ventricular size is mildly enlarged. No increase in right ventricular wall thickness. Right ventricular systolic function is low normal. There is moderately elevated pulmonary artery systolic pressure. The tricuspid regurgitant  velocity is 3.12 m/s, and with an assumed right atrial pressure of 15 mmHg, the estimated right ventricular systolic pressure is 90.2 mmHg. Left Atrium: Left atrial size was mildly dilated. Right Atrium: Right atrial size was  mildly dilated. Pericardium: Left pleural effusion evident. A small pericardial effusion is present. The pericardial effusion is posterior to the left ventricle. Mitral Valve: The mitral valve is abnormal. There is mild thickening of the mitral valve leaflet(s). There is mild calcification of the mitral valve leaflet(s). Mild mitral valve regurgitation. Tricuspid Valve: The tricuspid valve is grossly normal. Tricuspid valve regurgitation is mild. Aortic Valve: The aortic valve is tricuspid. There is moderate calcification of the aortic valve. There is mild to moderate aortic valve annular calcification. Aortic valve regurgitation is not visualized. Aorta: The aortic root is normal in size and structure. Venous: The inferior vena cava is dilated in size with less than 50% respiratory variability, suggesting right atrial pressure of 15 mmHg. IAS/Shunts: No atrial level shunt detected by color flow Doppler. LEFT VENTRICLE PLAX 2D LVIDd:         5.50 cm LVIDs:         4.30 cm LV PW:         1.30 cm LV IVS:        1.80 cm LVOT diam:     2.30 cm LVOT Area:     4.15 cm  RIGHT VENTRICLE TAPSE (M-mode): 1.7 cm LEFT ATRIUM              Index        RIGHT ATRIUM           Index LA diam:        4.20 cm  1.72 cm/m   RA Area:     27.30 cm LA Vol (A2C):   102.0 ml 41.85 ml/m  RA Volume:   96.50 ml  39.59 ml/m LA Vol (A4C):   74.5 ml  30.57 ml/m LA Biplane Vol: 91.8 ml  37.66 ml/m   AORTA Ao Root diam: 3.80 cm MITRAL VALVE                TRICUSPID VALVE MV Area (PHT): 5.02 cm     TR Peak grad:   38.9 mmHg MV Decel Time: 151 msec     TR Vmax:        312.00 cm/s MV E velocity: 143.00 cm/s                             SHUNTS                             Systemic Diam: 2.30 cm Rozann Lesches MD Electronically signed by Rozann Lesches MD Signature Date/Time: 11/28/2020/1:03:27 PM    Final     Orson Eva, DO  [facility]  If 7PM-7AM, please contact night-coverage [email or web address] Password [email or web address] 12/02/2020, 5:35 PM    LOS: 5 days

## 2020-12-02 NOTE — Progress Notes (Signed)
Notified by lab that patients blood cultures came back with gram positive cocci for aerobic and anaerobic bottles. Lab stated cultures sent to [facility].

## 2020-12-02 NOTE — Progress Notes (Deleted)
ANTICOAGULATION CONSULT NOTE  Pharmacy Consult for Heparin Indication: hx DVT/afib  Allergies  Allergen Reactions   Latex Hives and Rash   Lipitor [Atorvastatin]     Leg aches     Patient Measurements: Height: 6\' 2"  (188 cm) Weight: 118.1 kg (260 lb 5.8 oz) IBW/kg (Calculated) : 82.2 HEPARIN DW (KG): 107.6   Vital Signs: Temp: 98 F (36.7 C) (10/21 2042) BP: 107/58 (10/21 2042) Pulse Rate: 81 (10/21 2042)  Labs: Recent Labs    11/29/20 0440 11/30/20 0453 11/30/20 1347 12/01/20 0007 12/01/20 0538 12/01/20 1643 12/02/20 0131  HGB 9.4* 9.0*  --   --  9.3*  --   --   HCT 31.9* 29.7*  --   --  30.3*  --   --   PLT 162 163  --   --  166  --   --   APTT  --  49*   < > >200*  --  30 41*  HEPARINUNFRC  --  >1.10*  --   --   --  >1.10* >1.10*  CREATININE 6.10* 5.11*  --   --  6.35*  --   --    < > = values in this interval not displayed.     Estimated Creatinine Clearance: 15 mL/min (A) (by C-G formula based on SCr of 6.35 mg/dL (H)).   Assessment: 70 y.o. male with h/o DVT and Afib, Eliquis on hold, for heparin.  aPTT subtherapeutic  Goal of Therapy:  APTT 66-102 sec Heparin level 0.3-0.7 units/ml Monitor platelets by anticoagulation protocol: Yes   Plan:  Increase Heparin 1800 units/hr aPTT in 6 hours  Phillis Knack, PharmD, BCPS  12/02/2020 4:00 AM

## 2020-12-02 NOTE — Progress Notes (Signed)
Subjective:  Patient has no complaints.  He says he does not have an appetite.  He denies shortness of breath or chest pain.  He also denies abdominal pain.  Current Medications:  Current Facility-Administered Medications:    acetaminophen (TYLENOL) tablet 650 mg, 650 mg, Oral, Q6H PRN, 650 mg at 11/30/20 0334 **OR** acetaminophen (TYLENOL) suppository 650 mg, 650 mg, Rectal, Q6H PRN, Arrien, Jimmy Picket, MD   albuterol (PROVENTIL) (2.5 MG/3ML) 0.083% nebulizer solution 2.5 mg, 2.5 mg, Nebulization, Q6H PRN, Arrien, Jimmy Picket, MD   calcitRIOL (ROCALTROL) capsule 0.5 mcg, 0.5 mcg, Oral, Q M,W,F, Rosita Fire, MD, 0.5 mcg at 12/01/20 0947   calcium acetate (PHOSLO) capsule 1,334 mg, 1,334 mg, Oral, TID with meals, Arrien, Jimmy Picket, MD, 1,334 mg at 12/01/20 0946   ceFEPIme (MAXIPIME) 2 g in sodium chloride 0.9 % 100 mL IVPB, 2 g, Intravenous, Q M,W,F-HD, Orson Eva, MD, Stopped at 12/01/20 1559   Chlorhexidine Gluconate Cloth 2 % PADS 6 each, 6 each, Topical, Q0600, Rosita Fire, MD, 6 each at 12/02/20 0623   fluticasone (FLONASE) 50 MCG/ACT nasal spray 2 spray, 2 spray, Each Nare, Daily PRN, Arrien, Jimmy Picket, MD   loratadine (CLARITIN) tablet 10 mg, 10 mg, Oral, q1800, Arrien, Jimmy Picket, MD, 10 mg at 11/30/20 1737   MEDLINE mouth rinse, 15 mL, Mouth Rinse, BID, Arrien, Jimmy Picket, MD, 15 mL at 12/01/20 0952   mupirocin ointment (BACTROBAN) 2 %, , Nasal, BID, Arrien, Jimmy Picket, MD, Given at 12/02/20 0941   ondansetron (ZOFRAN) tablet 4 mg, 4 mg, Oral, Q6H PRN **OR** ondansetron (ZOFRAN) injection 4 mg, 4 mg, Intravenous, Q6H PRN, Arrien, Jimmy Picket, MD, 4 mg at 11/28/20 0007   pantoprazole (PROTONIX) EC tablet 40 mg, 40 mg, Oral, Daily, Arrien, Jimmy Picket, MD, 40 mg at 12/01/20 0947   vancomycin (VANCOCIN) IVPB 1000 mg/200 mL premix, 1,000 mg, Intravenous, Q M,W,F-HD, Laren Everts, RPH, Stopped at 12/01/20  1505   Objective: Blood pressure 107/65, pulse 91, temperature 98.6 F (37 C), resp. rate (!) 22, height 6\' 2"  (1.88 m), weight 113.3 kg, SpO2 91 %. Patient is alert and he does not appear to be in any distress. He appears to be chronically ill.   Conjunctiva is pale. Sclera is nonicteric Cardiac exam with irregular rhythm normal S1 and S2. No murmur or gallop noted. Auscultation lungs reveal few crackles at right base. Abdomen abdomen is full.  He has hepatomegaly.  Liver is 5 to 6 cm below RCM.  Is firm nontender and not pulsatile.  Spleen is nonpalpable.  Abdomen is otherwise soft and nontender.  He has 2+ edema to both legs. No LE edema or clubbing noted.  Labs/studies Results:   CBC Latest Ref Rng & Units 12/02/2020 12/01/2020 11/30/2020  WBC 4.0 - 10.5 K/uL 11.3(H) 14.1(H) 14.0(H)  Hemoglobin 13.0 - 17.0 g/dL 9.8(L) 9.3(L) 9.0(L)  Hematocrit 39.0 - 52.0 % 32.9(L) 30.3(L) 29.7(L)  Platelets 150 - 400 K/uL 178 166 163    CMP Latest Ref Rng & Units 12/01/2020 11/30/2020 11/29/2020  Glucose 70 - 99 mg/dL 162(H) 120(H) 138(H)  BUN 8 - 23 mg/dL 69(H) 49(H) 60(H)  Creatinine 0.61 - 1.24 mg/dL 6.35(H) 5.11(H) 6.10(H)  Sodium 135 - 145 mmol/L 130(L) 133(L) 131(L)  Potassium 3.5 - 5.1 mmol/L 4.3 3.9 4.1  Chloride 98 - 111 mmol/L 93(L) 97(L) 95(L)  CO2 22 - 32 mmol/L 24 25 26   Calcium 8.9 - 10.3 mg/dL 8.4(L) 8.0(L) 7.9(L)  Total Protein 6.5 -  8.1 g/dL - - -  Total Bilirubin 0.3 - 1.2 mg/dL - - -  Alkaline Phos 38 - 126 U/L - - -  AST 15 - 41 U/L - - -  ALT 0 - 44 U/L - - -    Hepatic Function Latest Ref Rng & Units 12/01/2020 11/30/2020 11/27/2020  Total Protein 6.5 - 8.1 g/dL - - -  Albumin 3.5 - 5.0 g/dL 2.5(L) 2.5(L) 2.6(L)  AST 15 - 41 U/L - - -  ALT 0 - 44 U/L - - -  Alk Phosphatase 38 - 126 U/L - - -  Total Bilirubin 0.3 - 1.2 mg/dL - - -  Bilirubin, Direct 0.00 - 0.40 mg/dL - - -    Blood cultures from yesterday are negative.  Assessment:  #1.  Esophageal  dysphagia.  Patient will undergo esophagogastroduodenoscopy with possible dilation later today.  Heparin infusion was stopped at 5:00 this morning.  #2.  Anemia.  Hemoglobin is low but better than yesterday possibly because of hemoconcentration resulting from hemodialysis.  No evidence of active bleeding.  I suspect anemia is predominantly due to acute on chronic illness.  #3.  End-stage renal disease.  Patient underwent hemodialysis yesterday.  He is being followed by nephrology.  #4.  Sepsis.  First blood cultures positive for MRSA treated with vancomycin.  Right IJ was felt to be the source and line was removed by Dr. Constance Haw.  Blood cultures from 2 days ago revealed E. coli.  Blood culture from yesterday is negative.  He is presently on vancomycin and cefepime. Dr. Domenic Polite of cardiology is concerned about possibility of endocarditis.  No vegetation seen on echo which is suboptimal.  He is scheduled for TEE on 12/04/2020.  #5. cardiopulmonary issues include CHF atrial fibrillation decreased LV function and pulmonary hypertension.  #6.  Right foot ulcer.  Teresha Hanks antibiotic therapy should suffice.  Dr. Constance Haw to determine whether he needs an I&D.  #7. Hepatomegaly.  Suspect this is secondary to congestive heart failure and pulmonary hypertension.  No LFTs during this admission.   Plan:  Check LFTs. Esophagogastroduodenoscopy with esophageal dilation if indicated later today under monitored anesthesia care.

## 2020-12-02 NOTE — Transfer of Care (Signed)
Immediate Anesthesia Transfer of Care Note  Patient: Travis Blair  Procedure(s) Performed: ESOPHAGOGASTRODUODENOSCOPY (EGD) WITH PROPOFOL ESOPHAGEAL DILATION  Patient Location: PACU  Anesthesia Type:General  Level of Consciousness: sedated and drowsy  Airway & Oxygen Therapy: Patient Spontanous Breathing and Patient connected to face mask oxygen  Post-op Assessment: Report given to RN and Post -op Vital signs reviewed and stable  Post vital signs: Reviewed and stable  Last Vitals:  Vitals Value Taken Time  BP 107/56 12/02/20 1600  Temp 36.7 C 12/02/20 1545  Pulse 94 12/02/20 1603  Resp 27 12/02/20 1604  SpO2 90 % 12/02/20 1603  Vitals shown include unvalidated device data.  Last Pain:  Vitals:   12/02/20 1545  TempSrc:   PainSc: Asleep         Complications: No notable events documented.

## 2020-12-02 NOTE — Progress Notes (Signed)
Assisted to bedside toilet with assist of two.  Had large bm.  Restarted on heparin drip.

## 2020-12-02 NOTE — Progress Notes (Addendum)
[facility]  Right foot MRI with concern for fluid. Wound has been present for some time and ulcer present.   Patient is off heparin for EGD. I probed the area and cut out the callus and probed a small ulcerated area. Minor fluid removed. No overt purulence to sample.   Will order silvadene for the area and dry gauze. Difficult to say if osteo could be developing. This was all superficial. Needs close follow up.  ABI are noncompressible on 03/2019. He is a patient of [facility] already, so will need outpatient follow up with them.   Curlene Labrum, MD [facility] [address] [place], [place] [address] [phone] (office)

## 2020-12-02 NOTE — Anesthesia Preprocedure Evaluation (Addendum)
Anesthesia Evaluation  Patient identified by MRN, date of birth, ID band Patient awake    Reviewed: Allergy & Precautions, NPO status , Patient's Chart, lab work & pertinent test results  Airway Mallampati: III  TM Distance: >3 FB Neck ROM: Full    Dental  (+) Dental Advisory Given   Pulmonary asthma , sleep apnea and Continuous Positive Airway Pressure Ventilation , pneumonia, PE   Pulmonary exam normal breath sounds clear to auscultation       Cardiovascular Exercise Tolerance: Good hypertension, Pt. on medications + CAD, + Past MI, + CABG and +CHF  + dysrhythmias Atrial Fibrillation  Rhythm:Irregular Rate:Normal  1. Left ventricular ejection fraction, by estimation, is 45 to 50%. The  left ventricle has mildly decreased function. The left ventricle  demonstrates regional wall motion abnormalities (see scoring  diagram/findings for description). There is severe  asymmetric left ventricular hypertrophy of the basal and septal segments.  Left ventricular diastolic parameters are indeterminate. There is the  interventricular septum is flattened in systole and diastole, consistent  with right ventricular pressure and  volume overload.  2. Right ventricular systolic function is low normal. The right  ventricular size is mildly enlarged. There is moderately elevated  pulmonary artery systolic pressure. The estimated right ventricular  systolic pressure is 21.9 mmHg.  3. Left atrial size was mildly dilated.  4. Right atrial size was mildly dilated.  5. Left pleural effusion evident. A small pericardial effusion is  present. The pericardial effusion is posterior to the left ventricle.  6. The mitral valve is abnormal. Mild mitral valve regurgitation.  7. The aortic valve is tricuspid. There is moderate calcification of the  aortic valve. Aortic valve regurgitation is not visualized.  8. The inferior vena cava is dilated in  size with <50% respiratory  variability, suggesting right atrial pressure of 15 mmHg.    Neuro/Psych PSYCHIATRIC DISORDERS Depression  Neuromuscular disease    GI/Hepatic GERD  Medicated,  Endo/Other  diabetes, Well Controlled, Type 2Hypothyroidism   Renal/GU ESRFRenal disease     Musculoskeletal negative musculoskeletal ROS (+)   Abdominal   Peds  Hematology  (+) anemia ,   Anesthesia Other Findings   Reproductive/Obstetrics negative OB ROS                         Anesthesia Physical Anesthesia Plan  ASA: 4  Anesthesia Plan: General   Post-op Pain Management:    Induction: Intravenous  PONV Risk Score and Plan: TIVA  Airway Management Planned: Nasal Cannula and Natural Airway  Additional Equipment:   Intra-op Plan:   Post-operative Plan: Possible Post-op intubation/ventilation  Informed Consent: I have reviewed the patients History and Physical, chart, labs and discussed the procedure including the risks, benefits and alternatives for the proposed anesthesia with the patient or authorized representative who has indicated his/her understanding and acceptance.     Dental advisory given  Plan Discussed with: Surgeon  Anesthesia Plan Comments:         Anesthesia Quick Evaluation

## 2020-12-02 NOTE — Op Note (Signed)
[facility] Patient Name: Travis Blair Procedure Date: 12/02/2020 2:44 PM MRN: [id number] Date of Birth: Jan 13, 1951 Attending MD: Hildred Laser , MD CSN: [id number] Age: 70 Admit Type: Inpatient Procedure:                Upper GI endoscopy Indications:              Esophageal dysphagia Providers:                Hildred Laser, MD, Hinton Rao, RN, Casimer Bilis, Technician Referring MD:             Orson Eva, DO Medicines:                Propofol per Anesthesia Complications:            No immediate complications. Estimated Blood Loss:     Estimated blood loss: none. Procedure:                Pre-Anesthesia Assessment:                           - Prior to the procedure, a History and Physical                            was performed, and patient medications and                            allergies were reviewed. The patient's tolerance of                            previous anesthesia was also reviewed. The risks                            and benefits of the procedure and the sedation                            options and risks were discussed with the patient.                            All questions were answered, and informed consent                            was obtained. Prior Anticoagulants: The patient                            last took heparin on the day of the procedure. ASA                            Grade Assessment: IV - A patient with severe                            systemic disease that is a constant threat to life.  After reviewing the risks and benefits, the patient                            was deemed in satisfactory condition to undergo the                            procedure.                           After obtaining informed consent, the endoscope was                            passed under direct vision. Throughout the                            procedure, the patient's blood  pressure, pulse, and                            oxygen saturations were monitored continuously. The                            GIF-H190 ([id number]) scope was introduced through the                            mouth, and advanced to the second part of duodenum.                            The upper GI endoscopy was accomplished without                            difficulty. The patient tolerated the procedure                            well. Scope In: 3:13:21 PM Scope Out: 3:18:27 PM Total Procedure Duration: 0 hours 5 minutes 6 seconds  Findings:      The hypopharynx was normal.      The examined esophagus was normal.      The Z-line was regular and was found 46 cm from the incisors.      No endoscopic abnormality was evident in the esophagus to explain the       patient's complaint of dysphagia. It was decided, however, to proceed       with dilation of the entire esophagus. The scope was withdrawn. Dilation       was performed with a Maloney dilator with no resistance at 37 Fr. The       dilation site was examined following endoscope reinsertion and showed no       change and no bleeding, mucosal tear or perforation.      Mild portal hypertensive gastropathy was found in the gastric fundus and       in the gastric body.      The exam of the stomach was otherwise normal.      The duodenal bulb and second portion of the duodenum were normal. Impression:               - Normal hypopharynx.                           -  Normal esophagus.                           - Z-line regular, 46 cm from the incisors.                           - No endoscopic esophageal abnormality to explain                            patient's dysphagia. Esophagus dilated. Dilated.                           - Portal hypertensive gastropathy.                           - Normal duodenal bulb and second portion of the                            duodenum.                           - No specimens collected. Moderate  Sedation:      Per Anesthesia Care Recommendation:           - Return patient to hospital ward for ongoing care.                           - Resume previous diet today.                           - Continue present medications.                           - Resume heparin at prior dose today. Procedure Code(s):        --- Professional ---                           [phone], Esophagogastroduodenoscopy, flexible,                            transoral; diagnostic, including collection of                            specimen(s) by brushing or washing, when performed                            (separate procedure)                           43450, Dilation of esophagus, by unguided sound or                            bougie, single or multiple passes Diagnosis Code(s):        --- Professional ---                           K76.6, Portal hypertension  K31.89, Other diseases of stomach and duodenum                           R13.14, Dysphagia, pharyngoesophageal phase CPT copyright 2019 [employer]. All rights reserved. The codes documented in this report are preliminary and upon coder review may  be revised to meet current compliance requirements. Hildred Laser, MD Hildred Laser, MD 12/02/2020 3:28:38 PM This report has been signed electronically. Number of Addenda: 0

## 2020-12-02 NOTE — Progress Notes (Addendum)
Brief EGD note  Normal hypopharynx Normal esophageal mucosa and GE junction Esophagus dilated by passing 23 [place] Maloney dilator but no mucosal disruption noted. Examination of stomach reveals mild changes of portal hypertensive gastropathy but no ulcers noted.   Normal bulbar and post bulbar mucosa.

## 2020-12-03 LAB — CBC
HCT: 31.7 % — ABNORMAL LOW (ref 39.0–52.0)
Hemoglobin: 9.4 g/dL — ABNORMAL LOW (ref 13.0–17.0)
MCH: 27 pg (ref 26.0–34.0)
MCHC: 29.7 g/dL — ABNORMAL LOW (ref 30.0–36.0)
MCV: 91.1 fL (ref 80.0–100.0)
Platelets: 207 10*3/uL (ref 150–400)
RBC: 3.48 MIL/uL — ABNORMAL LOW (ref 4.22–5.81)
RDW: 18.6 % — ABNORMAL HIGH (ref 11.5–15.5)
WBC: 12 10*3/uL — ABNORMAL HIGH (ref 4.0–10.5)
nRBC: 0.2 % (ref 0.0–0.2)

## 2020-12-03 LAB — HEPARIN LEVEL (UNFRACTIONATED)
Heparin Unfractionated: >1.1 IU/mL — ABNORMAL HIGH (ref 0.30–0.70)
Heparin Unfractionated: >1.1 IU/mL — ABNORMAL HIGH (ref 0.30–0.70)
Heparin Unfractionated: 0.99 IU/mL — ABNORMAL HIGH (ref 0.30–0.70)

## 2020-12-03 LAB — APTT
aPTT: 44 seconds — ABNORMAL HIGH (ref 24–36)
aPTT: 45 seconds — ABNORMAL HIGH (ref 24–36)
aPTT: 40 seconds — ABNORMAL HIGH (ref 24–36)
aPTT: 43 seconds — ABNORMAL HIGH (ref 24–36)

## 2020-12-03 LAB — GLUCOSE, CAPILLARY: Glucose-Capillary: 137 mg/dL — ABNORMAL HIGH (ref 70–99)

## 2020-12-03 MED ORDER — SILVER SULFADIAZINE 1 % EX CREA
TOPICAL_CREAM | Freq: Every day | CUTANEOUS | Status: DC
  Filled 2020-12-03: qty 85

## 2020-12-03 MED ORDER — SILVER SULFADIAZINE 1 % EX CREA: TOPICAL_CREAM | Freq: Every day | CUTANEOUS | Status: DC

## 2020-12-03 NOTE — Progress Notes (Signed)
[facility]  Wound probed and silvadene applied. Mild erythema continues. Dressing replaced. Will monitor.

## 2020-12-03 NOTE — Progress Notes (Signed)
Subjective:  Patient says she is able to swallow liquids much better.  He tells me he can swallow solids better as well.  However he only took 5 or 6 bites at breakfast.  He says he did not like food.  He is asked his children to bring some food from outside.  He wants to eat hamburger from peds.  He denies abdominal or chest pain.   Current Medications:  Current Facility-Administered Medications:    acetaminophen (TYLENOL) tablet 650 mg, 650 mg, Oral, Q6H PRN, 650 mg at 12/03/20 1111 **OR** acetaminophen (TYLENOL) suppository 650 mg, 650 mg, Rectal, Q6H PRN, Arrien, Jimmy Picket, MD   albuterol (PROVENTIL) (2.5 MG/3ML) 0.083% nebulizer solution 2.5 mg, 2.5 mg, Nebulization, Q6H PRN, Arrien, Jimmy Picket, MD   calcitRIOL (ROCALTROL) capsule 0.5 mcg, 0.5 mcg, Oral, Q M,W,F, Rosita Fire, MD, 0.5 mcg at 12/01/20 0947   calcium acetate (PHOSLO) capsule 1,334 mg, 1,334 mg, Oral, TID with meals, Arrien, Jimmy Picket, MD, 1,334 mg at 12/03/20 1101   ceFEPIme (MAXIPIME) 2 g in sodium chloride 0.9 % 100 mL IVPB, 2 g, Intravenous, Q M,W,F-HD, Orson Eva, MD, Stopped at 12/01/20 1559   Chlorhexidine Gluconate Cloth 2 % PADS 6 each, 6 each, Topical, Q0600, Rosita Fire, MD, 6 each at 12/02/20 0623   fluticasone (FLONASE) 50 MCG/ACT nasal spray 2 spray, 2 spray, Each Nare, Daily PRN, Arrien, Jimmy Picket, MD   heparin ADULT infusion 100 units/mL (25000 units/277mL), 1,800 Units/hr, Intravenous, Continuous, Erenest Blank, RPH, Last Rate: 18 mL/hr at 12/03/20 0808, 1,800 Units/hr at 12/03/20 0808   loratadine (CLARITIN) tablet 10 mg, 10 mg, Oral, q1800, Arrien, Jimmy Picket, MD, 10 mg at 12/02/20 1711   MEDLINE mouth rinse, 15 mL, Mouth Rinse, BID, Arrien, Jimmy Picket, MD, 15 mL at 12/03/20 0811   mupirocin ointment (BACTROBAN) 2 %, , Nasal, BID, Arrien, Jimmy Picket, MD, Given at 12/03/20 0811   ondansetron (ZOFRAN) tablet 4 mg, 4 mg, Oral, Q6H PRN **OR**  ondansetron (ZOFRAN) injection 4 mg, 4 mg, Intravenous, Q6H PRN, Arrien, Jimmy Picket, MD, 4 mg at 11/28/20 0007   pantoprazole (PROTONIX) EC tablet 40 mg, 40 mg, Oral, Daily, Arrien, Jimmy Picket, MD, 40 mg at 12/03/20 0810   silver sulfADIAZINE (SILVADENE) 1 % cream, , Topical, Daily, Constance Haw, Lanell Matar, MD   vancomycin (VANCOCIN) IVPB 1000 mg/200 mL premix, 1,000 mg, Intravenous, Q M,W,F-HD, Laren Everts, RPH, Stopped at 12/01/20 1505   Objective: Blood pressure (!) 106/56, pulse 84, temperature 98.4 F (36.9 C), temperature source Oral, resp. rate 18, height $RemoveBe'6\' 2"'VbIyafkLP$  (1.88 m), weight 111.9 kg, SpO2 95 %. Patient is alert and more talkative today.  He is in no acute distress. Abdominal exam remains unchanged.  He has hepatomegaly.  Liver is nontender. LE edema is is 1-2+.  It is less in right leg.  Labs/studies Results:   CBC Latest Ref Rng & Units 12/03/2020 12/02/2020 12/01/2020  WBC 4.0 - 10.5 K/uL 12.0(H) 11.3(H) 14.1(H)  Hemoglobin 13.0 - 17.0 g/dL 9.4(L) 9.8(L) 9.3(L)  Hematocrit 39.0 - 52.0 % 31.7(L) 32.9(L) 30.3(L)  Platelets 150 - 400 K/uL 207 178 166    CMP Latest Ref Rng & Units 12/02/2020 12/01/2020 11/30/2020  Glucose 70 - 99 mg/dL - 162(H) 120(H)  BUN 8 - 23 mg/dL - 69(H) 49(H)  Creatinine 0.61 - 1.24 mg/dL - 6.35(H) 5.11(H)  Sodium 135 - 145 mmol/L - 130(L) 133(L)  Potassium 3.5 - 5.1 mmol/L - 4.3 3.9  Chloride 98 -  111 mmol/L - 93(L) 97(L)  CO2 22 - 32 mmol/L - 24 25  Calcium 8.9 - 10.3 mg/dL - 8.4(L) 8.0(L)  Total Protein 6.5 - 8.1 g/dL 6.9 - -  Total Bilirubin 0.3 - 1.2 mg/dL 1.6(H) - -  Alkaline Phos 38 - 126 U/L 174(H) - -  AST 15 - 41 U/L 347(H) - -  ALT 0 - 44 U/L 130(H) - -    Hepatic Function Latest Ref Rng & Units 12/02/2020 12/01/2020 11/30/2020  Total Protein 6.5 - 8.1 g/dL 6.9 - -  Albumin 3.5 - 5.0 g/dL 3.1(L) 2.5(L) 2.5(L)  AST 15 - 41 U/L 347(H) - -  ALT 0 - 44 U/L 130(H) - -  Alk Phosphatase 38 - 126 U/L 174(H) - -  Total Bilirubin  0.3 - 1.2 mg/dL 1.6(H) - -  Bilirubin, Direct 0.0 - 0.2 mg/dL 0.5(H) - -    First blood culture from 08/28/2020 positive for MRSA Second blood culture from 11/30/2020 positive for E. Coli Blood culture from 12/01/2020 positive for staph aureus.   Assessment:  #1.  Esophageal dysphagia.  No structural abnormality noted to esophagus on EGD yesterday.  Esophagus was dilated by passing 37 [place] Maloney dilator with symptomatic improvement.  It remains to be seen how long the benefit would last.  I suspect he has underlying esophageal motility disorder.  #2.  Anemia.  No evidence of overt GI bleed.  Hemoglobin is gradually going down.  #3.  End-stage renal disease.  Patient will be due for hemodialysis tomorrow.  He is due for placement of dialysis catheter tomorrow which may have to be postponed given positive blood cultures but Dr. Constance Haw will make final determination.  #4.  Sepsis.  First blood culture was positive for staph aureus.  IJ cannula was removed.  Second culture 2 days later positive for E. coli and culture from 2 days ago revealed staph aureus again.  Patient is on vancomycin and cefepime.  Given the scenario endocarditis remains a distinct possibility.  Patient is scheduled for TEE tomorrow.  #5. Hepatomegaly.  Bilirubin transaminases and alkaline phosphatase elevated.  Suspect this injury secondary to hepatic congestion.  Acute hepatitis panel was negative in July 2022.  Hepatitis B surface antigen is negative during this admission.  #6.  Other problems include atrial fibrillation cardiomyopathy pulmonary hypertension diabetes mellitus and history of DVT and PE.   Plan:  LFTs with a.m. labs. Patient encouraged to drink at least 2 cans of protein drink.

## 2020-12-03 NOTE — Progress Notes (Signed)
ANTICOAGULATION CONSULT NOTE  Pharmacy Consult for Heparin Indication: hx DVT/afib  Allergies  Allergen Reactions   Latex Hives and Rash   Lipitor [Atorvastatin]     Leg aches     Patient Measurements: Height: 6\' 2"  (188 cm) Weight: 113.1 kg (249 lb 5.4 oz) IBW/kg (Calculated) : 82.2 HEPARIN DW (KG): 107.6   Vital Signs: Temp: 98.2 F (36.8 C) (10/22 2148) Temp Source: Oral (10/22 2148) BP: 111/55 (10/22 2148) Pulse Rate: 80 (10/22 2148)  Labs: Recent Labs    11/30/20 0453 11/30/20 1347 12/01/20 0538 12/01/20 1643 12/02/20 0131 12/02/20 0435 12/03/20 0200  HGB 9.0*  --  9.3*  --   --  9.8*  --   HCT 29.7*  --  30.3*  --   --  32.9*  --   PLT 163  --  166  --   --  178  --   APTT 49*   < >  --  30 41*  --  44*  HEPARINUNFRC >1.10*  --   --  >1.10* >1.10*  --  >1.10*  CREATININE 5.11*  --  6.35*  --   --   --   --    < > = values in this interval not displayed.     Estimated Creatinine Clearance: 14.7 mL/min (A) (by C-G formula based on SCr of 6.35 mg/dL (H)).   Assessment: 70 y.o. male with h/o DVT and Afib, Eliquis on hold, for heparin.HL still not correlating with APTT   10/23 AM update:  aPTT low   Goal of Therapy:  APTT 66-102 sec Heparin level 0.3-0.7 units/ml Monitor platelets by anticoagulation protocol: Yes   Plan:  Inc heparin to 1800 units/hr 1100 heparin level and aPTT  Narda Bonds, PharmD, BCPS Clinical Pharmacist Phone: [phone]

## 2020-12-03 NOTE — Progress Notes (Signed)
    [facility]   Date of Admission:  11/27/2020     Reason for visit: Follow up on MRSA bacteremia  Interval History:   10/18: Blood cx + MRSA, TTE w/o obvious vegetation 10/19: S/p TDC removal with Dr Constance Haw 10/20: Blood cx + E coli 10/21: Blood cx + MRSA, MRI right foot concerning for 2.3cm abscess and early osteomyelitis.  Received HD using AV fistula 10/22: EGD done with esophageal dilation performed.  Also, surgery probed right foot wound with minor fluid removed, no overt purulence.  Unclear if osteo developing since appeared superficial when wound probed.  Will need vascular follow up outpatient due to non-compressible ABI in 03/2019.   Assessment:  MRSA bacteremia with continued positive blood cultures.  Planning for TEE Monday 10/24 E coli bacteremia Right DFU, possible early OM ESRD Elevated LFTs  Recommendations: -- continue current antibiotics -- TEE  -- repeat blood cultures -- hold off placing any new tunneled HD lines pending blood cx clearance -- wound care -- will follow   [name] for Infectious [place] [phone] pager 12/03/2020, 11:41 AM

## 2020-12-03 NOTE — Progress Notes (Signed)
ANTICOAGULATION CONSULT NOTE  Pharmacy Consult for Heparin Indication: hx DVT/afib  Allergies  Allergen Reactions   Latex Hives and Rash   Lipitor [Atorvastatin]     Leg aches     Patient Measurements: Height: 6\' 2"  (188 cm) Weight: 111.9 kg (246 lb 11.1 oz) IBW/kg (Calculated) : 82.2 HEPARIN DW (KG): 107.6   Vital Signs: Temp: 98.4 F (36.9 C) (10/23 0500) Temp Source: Oral (10/23 0500) BP: 106/56 (10/23 0500) Pulse Rate: 84 (10/23 0500)  Labs: Recent Labs    12/01/20 0538 12/01/20 1643 12/02/20 0435 12/03/20 0200 12/03/20 0447 12/03/20 1039  HGB 9.3*  --  9.8*  --  9.4*  --   HCT 30.3*  --  32.9*  --  31.7*  --   PLT 166  --  178  --  207  --   APTT  --    < >  --  44* 45* 40*  HEPARINUNFRC  --    < >  --  >1.10* >1.10* 0.99*  CREATININE 6.35*  --   --   --   --   --    < > = values in this interval not displayed.     Estimated Creatinine Clearance: 14.6 mL/min (A) (by C-G formula based on SCr of 6.35 mg/dL (H)).   Assessment: 70 y.o. male with h/o DVT and Afib, Eliquis on hold, for heparin.HL still not correlating with APTT   10/23 update:  aPTT low, 40 seconds    Goal of Therapy:  APTT 66-102 sec Heparin level 0.3-0.7 units/ml Monitor platelets by anticoagulation protocol: Yes   Plan:  Inc heparin to 2000 units/hr 1930 heparin level and aPTT  Donna Christen Jadden Yim, PharmD, MBA, BCGP Clinical Pharmacist

## 2020-12-03 NOTE — Progress Notes (Signed)
PROGRESS NOTE  Travis Blair OEV:[id number] DOB: [birth year] DOA: 11/27/2020 PCP: [name], MD  Brief History:   70 y.o. male with medical history significant of CHF, ERSD on HD (M-W-F), CAD, DVT/PE, and T2DM, CABG with post operative atrial fibrillation, pulmonary hypertension who presented with dyspnea.  Patient reported having cough for about 2 weeks, persistent and worsening.  Tried several antitussive agents at home without improvement of symptoms.  He went to dialysis on Friday, oct 14. He e was noted to be dyspneic by his wife, moderate in intensity, associated with cough.  He has had a chronic cough for at least one year, worse over last week prior to [place].  He took some robitussin and codeine from an old prescription on 11/26/20.  On Monday, he was more unsteady and fell in the BR.  He had subjective f/c at home since 11/24/20.  He denied n/v/d, abd pain.     His main symptom seems to be increased work of breathing/dyspnea, moderate to severe in intensity, worse with exertion, associated with cough, no chest pain, no improving factors. Last night not able to sleep due to persistent cough, dry.  He continues to make urine 3 times daily.   In June 2022 he decided to stop apixaban due to weakness, he was placed on clopidogrel. Last year he had COVID-pneumonia and pulm embolism. About 3 months ago he was started on hemodialysis, he has a fistula which is not mature yet.  He uses a tunneled dialysis catheter for dialysis.  Since admission, nephrology was consulted to assist.  He was dialyzed on 10/17 and 10/19.  He was found to have MRSA bacteremia.  General surgery was consulted to remove his Permacath.  He was started on IV vancomycin 11/29/20.  Repeat blood culture on 10/20 showed Ecoli, and he was started on cefepime.   Assessment/Plan: Sepsis  -present on admission -had fever and leukocytosis -due to MRSA bacteremia -sepsis physiology resolved   MRSA  bacteremia -removed tunneled HD catheter 10/19 after HD -10/18 blood culture = MRSA (peripheral) -10/20 blood culture = E Coli (peripheral) -12/01/20 blood culture = S aureus (on HD via fistula) -11/28/20 TTE--no obvious vegetation -able to to use fistula for HD 10/21 -consult cardiology for TEE>>tentatively scheduled for 10/24 -will need to delay permacath placement   EColi Bacteremia -continue cefepime on HD>>plan to switch to cefazolin on HD based on susceptibilities -10/20 blood culture = EColi--no MRSA   Right Diabetic foot ulcerAcute Osteomyelitis 5th metatarsal -spouse states wound is improving with care at home -MR right foot--Soft tissue ulceration along the lateral forefoot near the fifth metatarsal head with underlying 2.3 cm curvilinear fluid collection,concerning for abscess -consult wound care -discussed with surgery--foot debrided 10/22>>no pus   Acute respiratory failure with hypoxia -due to pulmonary edema -presented with oxygen saturation 87% on RA and tachypnea -stable on 2L -wean off oxygen for saturation >92%   Acute on chronic diastolic CHF/Acute systolic CHF -10/10/91 Echo 60-65%, no WMA; RV systolic function preserved.  RSVP 57.3.  No significant valvular disease. -fluid removal on HD -11/28/20 Echo EF 45-50%+WMA, RV overload, no vegetation -stable on 2L   Dysphagia -12/02/20 EGD--no esophageal abnormality to explain dysphagia; dilated; mild portal hypertensive gastropathy -suspect he has esophageal dysmotility from his medical issues -swallowing better since dilatation   ESRD -appreciate renal consult -HD on 10/17, 10/19, 10/21 -10/21--able to use fistula   History DVT/PE -hold apixaban temporarily in preparation for  new catheter insertion   Chronic Afib -AC as discussed above -continue IV heparin   Uncontrolled Diabetes mellitus type 2 with hyperglycemia -08/18/20 A1C--8.0 -CBGs controlled so far -novolog sliding scale -10/19- A1C 6.3    Right Elbow pain -due to recent fall on 11/27/20 -xray--no fracture; small anterior joint eff -improved--no further synovitis   Transaminasemia -likely due to sepsis and hemodynamic changes -repeat LFTs in am       Status is: Inpatient   Remains inpatient appropriate because: severity of illness               Family Communication:   spouse updated 10/23   Consultants:  renal, general surgery, GI, ID   Code Status:  FULL   DVT Prophylaxis:  IV Heparin      Procedures: As Listed in Progress Note Above   Antibiotics: Vanco 10/19>> Cefepime 10/21>>     Subjective: Had one loose BM today without blood.  Denies f/c, cp, sob, n/v/ abd pain.  Objective: Vitals:   12/02/20 1615 12/02/20 2148 12/03/20 0500 12/03/20 1415  BP: (!) 114/55 (!) 111/55 (!) 106/56 (!) 114/46  Pulse: 79 80 84 77  Resp: 18 18 18 18   Temp:  98.2 F (36.8 C) 98.4 F (36.9 C) (!) 97.5 F (36.4 C)  TempSrc:  Oral Oral Oral  SpO2: 94% 95% 95% 94%  Weight:   111.9 kg   Height:        Intake/Output Summary (Last 24 hours) at 12/03/2020 1539 Last data filed at 12/03/2020 1500 Gross per 24 hour  Intake 1933.41 ml  Output --  Net 1933.41 ml   Weight change: -4.8 kg Exam:  General:  Pt is alert, follows commands appropriately, not in acute distress HEENT: No icterus, No thrush, No neck mass, [place]/AT Cardiovascular: RRR, S1/S2, no rubs, no gallops Respiratory: bibasilar crackles R>L Abdomen: Soft/+BS, non tender, non distended, no guarding Extremities: trace LE edema, No lymphangitis, No petechiae, No rashes, no synovitis   Data Reviewed: I have personally reviewed following labs and imaging studies Basic Metabolic Panel: Recent Labs  Lab 11/27/20 1711 11/28/20 0449 11/29/20 0440 11/30/20 0453 12/01/20 0538  NA 135 135 131* 133* 130*  K 3.8 3.9 4.1 3.9 4.3  CL 100 99 95* 97* 93*  CO2 24 26 26 25 24   GLUCOSE 178* 126* 138* 120* 162*  BUN 74* 41* 60* 49* 69*  CREATININE  6.75* 4.38* 6.10* 5.11* 6.35*  CALCIUM 8.0* 8.1* 7.9* 8.0* 8.4*  PHOS 4.4  --   --  3.2 3.9   Liver Function Tests: Recent Labs  Lab 11/27/20 1711 11/30/20 0453 12/01/20 0538 12/02/20 0435  AST  --   --   --  347*  ALT  --   --   --  130*  ALKPHOS  --   --   --  174*  BILITOT  --   --   --  1.6*  PROT  --   --   --  6.9  ALBUMIN 2.6* 2.5* 2.5* 3.1*   No results for input(s): LIPASE, AMYLASE in the last 168 hours. No results for input(s): AMMONIA in the last 168 hours. Coagulation Profile: No results for input(s): INR, PROTIME in the last 168 hours. CBC: Recent Labs  Lab 11/29/20 0440 11/30/20 0453 12/01/20 0538 12/02/20 0435 12/03/20 0447  WBC 15.0* 14.0* 14.1* 11.3* 12.0*  HGB 9.4* 9.0* 9.3* 9.8* 9.4*  HCT 31.9* 29.7* 30.3* 32.9* 31.7*  MCV 92.7 90.8 90.4 91.4 91.1  PLT  162 163 166 178 207   Cardiac Enzymes: No results for input(s): CKTOTAL, CKMB, CKMBINDEX, TROPONINI in the last 168 hours. BNP: Invalid input(s): POCBNP CBG: Recent Labs  Lab 11/27/20 2216 11/29/20 2009 12/02/20 1419 12/02/20 1544  GLUCAP 128* 130* 137* 135*   HbA1C: No results for input(s): HGBA1C in the last 72 hours. Urine analysis:    Component Value Date/Time   COLORURINE YELLOW 03/10/2017 1553   APPEARANCEUR HAZY (A) 03/10/2017 1553   LABSPEC 1.013 03/10/2017 1553   PHURINE 5.0 03/10/2017 1553   GLUCOSEU NEGATIVE 03/10/2017 1553   HGBUR NEGATIVE 03/10/2017 1553   BILIRUBINUR NEGATIVE 03/10/2017 1553   KETONESUR NEGATIVE 03/10/2017 1553   PROTEINUR 30 (A) 03/10/2017 1553   NITRITE NEGATIVE 03/10/2017 1553   LEUKOCYTESUR NEGATIVE 03/10/2017 1553   Sepsis Labs: @LABRCNTIP (procalcitonin:4,lacticidven:4) ) Recent Results (from the past 240 hour(s))  Resp Panel by RT-PCR (Flu A&B, Covid) Nasopharyngeal Swab     Status: None   Collection Time: 11/27/20 10:09 AM   Specimen: Nasopharyngeal Swab; Nasopharyngeal(NP) swabs in vial transport medium  Result Value Ref Range Status    SARS Coronavirus 2 by RT PCR NEGATIVE NEGATIVE Final    Comment: (NOTE) SARS-CoV-2 target nucleic acids are NOT DETECTED.  The SARS-CoV-2 RNA is generally detectable in upper respiratory specimens during the acute phase of infection. The lowest concentration of SARS-CoV-2 viral copies this assay can detect is 138 copies/mL. A negative result does not preclude SARS-Cov-2 infection and should not be used as the sole basis for treatment or other patient management decisions. A negative result may occur with  improper specimen collection/handling, submission of specimen other than nasopharyngeal swab, presence of viral mutation(s) within the areas targeted by this assay, and inadequate number of viral copies(<138 copies/mL). A negative result must be combined with clinical observations, patient history, and epidemiological information. The expected result is Negative.  Fact Sheet for Patients:  EntrepreneurPulse.com.au  Fact Sheet for Healthcare Providers:  IncredibleEmployment.be  This test is no t yet approved or cleared by the [place] FDA and  has been authorized for detection and/or diagnosis of SARS-CoV-2 by FDA under an Emergency Use Authorization (EUA). This EUA will remain  in effect (meaning this test can be used) for the duration of the COVID-19 declaration under Section 564(b)(1) of the Act, 21 U.S.C.section 360bbb-3(b)(1), unless the authorization is terminated  or revoked sooner.       Influenza A by PCR NEGATIVE NEGATIVE Final   Influenza B by PCR NEGATIVE NEGATIVE Final    Comment: (NOTE) The Xpert Xpress SARS-CoV-2/FLU/RSV plus assay is intended as an aid in the diagnosis of influenza from Nasopharyngeal swab specimens and should not be used as a sole basis for treatment. Nasal washings and aspirates are unacceptable for Xpert Xpress SARS-CoV-2/FLU/RSV testing.  Fact Sheet for  Patients: EntrepreneurPulse.com.au  Fact Sheet for Healthcare Providers: IncredibleEmployment.be  This test is not yet approved or cleared by the [place] FDA and has been authorized for detection and/or diagnosis of SARS-CoV-2 by FDA under an Emergency Use Authorization (EUA). This EUA will remain in effect (meaning this test can be used) for the duration of the COVID-19 declaration under Section 564(b)(1) of the Act, 21 U.S.C. section 360bbb-3(b)(1), unless the authorization is terminated or revoked.  Performed at [facility], [address], [place], [place] [address]   MRSA Next Gen by PCR, Nasal     Status: Abnormal   Collection Time: 11/27/20  4:10 PM   Specimen: Nasal Mucosa; Nasal Swab  Result Value Ref Range Status   MRSA by PCR Next Gen DETECTED (A) NOT DETECTED Final    Comment: RESULT CALLED TO, READ BACK BY AND VERIFIED WITH: HEATHER TETREAULT,1951,11/27/2020,SELF S. (NOTE) The GeneXpert MRSA Assay (FDA approved for NASAL specimens only), is one component of a comprehensive MRSA colonization surveillance program. It is not intended to diagnose MRSA infection nor to guide or monitor treatment for MRSA infections. Test performance is not FDA approved in patients less than 86 years old. Performed at [facility], [address] [place], [place] [address]   Culture, blood (Routine X 2) w Reflex to ID Panel     Status: Abnormal   Collection Time: 11/28/20  9:34 AM   Specimen: BLOOD RIGHT HAND  Result Value Ref Range Status   Specimen Description   Final    BLOOD RIGHT HAND Performed at [facility] [address], [place], [place] [address]    Special Requests   Final    Immunocompromised Blood Culture adequate volume BOTTLES DRAWN AEROBIC AND ANAEROBIC Performed at [facility], [address] [place], [place] [address]    Culture  Setup Time   Final    GRAM POSITIVE COCCI IN BOTH AEROBIC AND ANAEROBIC BOTTLES Gram Stain  Report Called to,Read Back By and Verified WithLynwood Dawley, 2216, 11/28/2020,SELF S aerobic bottle previously called 10.18.22 2353 matthews, b  Performed at [facility], [address], [place], [place] [address]    Culture (A)  Final    STAPHYLOCOCCUS AUREUS SUSCEPTIBILITIES PERFORMED ON PREVIOUS CULTURE WITHIN THE LAST 5 DAYS. Performed at [facility], [place] [address] [place], [place] [address]    Report Status 12/02/2020 FINAL  Final  Culture, blood (Routine X 2) w Reflex to ID Panel     Status: Abnormal   Collection Time: 11/28/20  9:34 AM   Specimen: BLOOD RIGHT FOREARM  Result Value Ref Range Status   Specimen Description   Final    BLOOD RIGHT FOREARM Performed at [facility], [address], [place], [place] [address]    Special Requests   Final    Immunocompromised Blood Culture adequate volume BOTTLES DRAWN AEROBIC AND ANAEROBIC Performed at [facility], [address] [place], [place] [address]    Culture  Setup Time   Final    GRAM POSITIVE COCCI IN BOTH AEROBIC AND ANAEROBIC BOTTLES Gram Stain Report Called to,Read Back By and Verified With: T.ALLEN,2236,11/28/2020,SELF S aerobic bottle [facility] previously called matthews, b 10.18.22 2355 CRITICAL RESULT CALLED TO, READ BACK BY AND VERIFIED WITH: T ALLEN,RN@0209  11/29/20 [place]    Culture (A)  Final    METHICILLIN RESISTANT STAPHYLOCOCCUS AUREUS VIRIDANS STREPTOCOCCUS THE SIGNIFICANCE OF ISOLATING THIS ORGANISM FROM A SINGLE SET OF BLOOD CULTURES WHEN MULTIPLE SETS ARE DRAWN IS UNCERTAIN. PLEASE NOTIFY THE MICROBIOLOGY DEPARTMENT WITHIN ONE WEEK IF SPECIATION AND SENSITIVITIES ARE REQUIRED. Performed at [facility], [place] [address] [place],  [address]    Report Status 12/02/2020 FINAL  Final   Organism ID, Bacteria METHICILLIN RESISTANT STAPHYLOCOCCUS AUREUS  Final      Susceptibility   Methicillin resistant staphylococcus aureus - MIC*    CIPROFLOXACIN >=8 RESISTANT Resistant     ERYTHROMYCIN >=8  RESISTANT Resistant     GENTAMICIN <=0.5 SENSITIVE Sensitive     OXACILLIN >=4 RESISTANT Resistant     TETRACYCLINE <=1 SENSITIVE Sensitive     VANCOMYCIN 1 SENSITIVE Sensitive     TRIMETH/SULFA <=10 SENSITIVE Sensitive     CLINDAMYCIN >=8  RESISTANT Resistant     RIFAMPIN <=0.5 SENSITIVE Sensitive     Inducible Clindamycin NEGATIVE Sensitive     * METHICILLIN RESISTANT STAPHYLOCOCCUS AUREUS  Blood Culture ID Panel (Reflexed)     Status: Abnormal   Collection Time: 11/28/20  9:34 AM  Result Value Ref Range Status   Enterococcus faecalis NOT DETECTED NOT DETECTED Final   Enterococcus Faecium NOT DETECTED NOT DETECTED Final   Listeria monocytogenes NOT DETECTED NOT DETECTED Final   Staphylococcus species DETECTED (A) NOT DETECTED Final    Comment: CRITICAL RESULT CALLED TO, READ BACK BY AND VERIFIED WITH: T ALLEN,RN@0209  11/29/20 [place]    Staphylococcus aureus (BCID) DETECTED (A) NOT DETECTED Final    Comment: Methicillin (oxacillin)-resistant Staphylococcus aureus (MRSA). MRSA is predictably resistant to beta-lactam antibiotics (except ceftaroline). Preferred therapy is vancomycin unless clinically contraindicated. Patient requires contact precautions if  hospitalized. CRITICAL RESULT CALLED TO, READ BACK BY AND VERIFIED WITH: T ALLEN,RN@0209  11/29/20 [place]    Staphylococcus epidermidis NOT DETECTED NOT DETECTED Final   Staphylococcus lugdunensis NOT DETECTED NOT DETECTED Final   Streptococcus species DETECTED (A) NOT DETECTED Final    Comment: Not Enterococcus species, Streptococcus agalactiae, Streptococcus pyogenes, or Streptococcus pneumoniae. CRITICAL RESULT CALLED TO, READ BACK BY AND VERIFIED WITH: T ALLEN,RN@ 0209 11/29/20 [place]    Streptococcus agalactiae NOT DETECTED NOT DETECTED Final   Streptococcus pneumoniae NOT DETECTED NOT DETECTED Final   Streptococcus pyogenes NOT DETECTED NOT DETECTED Final   A.calcoaceticus-baumannii NOT DETECTED NOT DETECTED Final   Bacteroides fragilis  NOT DETECTED NOT DETECTED Final   Enterobacterales NOT DETECTED NOT DETECTED Final   Enterobacter cloacae complex NOT DETECTED NOT DETECTED Final   Escherichia coli NOT DETECTED NOT DETECTED Final   Klebsiella aerogenes NOT DETECTED NOT DETECTED Final   Klebsiella oxytoca NOT DETECTED NOT DETECTED Final   Klebsiella pneumoniae NOT DETECTED NOT DETECTED Final   Proteus species NOT DETECTED NOT DETECTED Final   Salmonella species NOT DETECTED NOT DETECTED Final   Serratia marcescens NOT DETECTED NOT DETECTED Final   Haemophilus influenzae NOT DETECTED NOT DETECTED Final   Neisseria meningitidis NOT DETECTED NOT DETECTED Final   Pseudomonas aeruginosa NOT DETECTED NOT DETECTED Final   Stenotrophomonas maltophilia NOT DETECTED NOT DETECTED Final   Candida albicans NOT DETECTED NOT DETECTED Final   Candida auris NOT DETECTED NOT DETECTED Final   Candida glabrata NOT DETECTED NOT DETECTED Final   Candida krusei NOT DETECTED NOT DETECTED Final   Candida parapsilosis NOT DETECTED NOT DETECTED Final   Candida tropicalis NOT DETECTED NOT DETECTED Final   Cryptococcus neoformans/gattii NOT DETECTED NOT DETECTED Final   Meth resistant mecA/C and MREJ DETECTED (A) NOT DETECTED Final    Comment: CRITICAL RESULT CALLED TO, READ BACK BY AND VERIFIED WITH: T ALLEN,RN@0209  11/29/20 [place] Performed at [facility], [address], [place], [place] [address]   Culture, blood (Routine X 2) w Reflex to ID Panel     Status: Abnormal   Collection Time: 11/30/20  4:53 AM   Specimen: BLOOD RIGHT HAND  Result Value Ref Range Status   Specimen Description   Final    BLOOD RIGHT HAND Performed at [facility], [address], [place], [place] [address]    Special Requests   Final    BOTTLES DRAWN AEROBIC AND ANAEROBIC Blood Culture adequate volume Performed at [facility], [address], [place], [place] [address]    Culture  Setup Time   Final    GRAM NEGATIVE RODS AEROBIC  BOTTLE ONLY Gram Stain  Report Called to,Read Back By and Verified With: EVANS T.,2003,11/30/2020,SELF S CRITICAL RESULT CALLED TO, READ BACK BY AND VERIFIED WITH: S,HURTH PHARMD @1216  12/01/20 [place] Performed at [facility], [place] [address] [place], [place] [address]    Culture ESCHERICHIA COLI (A)  Final   Report Status 12/02/2020 FINAL  Final   Organism ID, Bacteria ESCHERICHIA COLI  Final      Susceptibility   Escherichia coli - MIC*    AMPICILLIN >=32 RESISTANT Resistant     CEFAZOLIN <=4 SENSITIVE Sensitive     CEFEPIME <=0.12 SENSITIVE Sensitive     CEFTAZIDIME <=1 SENSITIVE Sensitive     CEFTRIAXONE <=0.25 SENSITIVE Sensitive     CIPROFLOXACIN <=0.25 SENSITIVE Sensitive     GENTAMICIN >=16 RESISTANT Resistant     IMIPENEM <=0.25 SENSITIVE Sensitive     TRIMETH/SULFA >=320 RESISTANT Resistant     AMPICILLIN/SULBACTAM >=32 RESISTANT Resistant     PIP/TAZO <=4 SENSITIVE Sensitive     * ESCHERICHIA COLI  Culture, blood (Routine X 2) w Reflex to ID Panel     Status: None (Preliminary result)   Collection Time: 11/30/20  4:53 AM   Specimen: BLOOD RIGHT ARM  Result Value Ref Range Status   Specimen Description BLOOD RIGHT ARM  Final   Special Requests   Final    BOTTLES DRAWN AEROBIC AND ANAEROBIC Blood Culture results may not be optimal due to an excessive volume of blood received in culture bottles   Culture   Final    NO GROWTH 3 DAYS Performed at [facility], [address], [place], [place] [address]    Report Status PENDING  Incomplete  Blood Culture ID Panel (Reflexed)     Status: Abnormal   Collection Time: 11/30/20  4:53 AM  Result Value Ref Range Status   Enterococcus faecalis NOT DETECTED NOT DETECTED Final   Enterococcus Faecium NOT DETECTED NOT DETECTED Final   Listeria monocytogenes NOT DETECTED NOT DETECTED Final   Staphylococcus species NOT DETECTED NOT DETECTED Final   Staphylococcus aureus (BCID) NOT DETECTED NOT DETECTED Final   Staphylococcus epidermidis NOT DETECTED NOT  DETECTED Final   Staphylococcus lugdunensis NOT DETECTED NOT DETECTED Final   Streptococcus species NOT DETECTED NOT DETECTED Final   Streptococcus agalactiae NOT DETECTED NOT DETECTED Final   Streptococcus pneumoniae NOT DETECTED NOT DETECTED Final   Streptococcus pyogenes NOT DETECTED NOT DETECTED Final   A.calcoaceticus-baumannii NOT DETECTED NOT DETECTED Final   Bacteroides fragilis NOT DETECTED NOT DETECTED Final   Enterobacterales DETECTED (A) NOT DETECTED Final    Comment: Enterobacterales represent a large order of gram negative bacteria, not a single organism. CRITICAL RESULT CALLED TO, READ BACK BY AND VERIFIED WITH: S,HURTH PHARMD @1216  12/01/20 [place]    Enterobacter cloacae complex NOT DETECTED NOT DETECTED Final   Escherichia coli DETECTED (A) NOT DETECTED Final    Comment: CRITICAL RESULT CALLED TO, READ BACK BY AND VERIFIED WITH: S,HURTH PHARMD @1216  12/01/20 [place]    Klebsiella aerogenes NOT DETECTED NOT DETECTED Final   Klebsiella oxytoca NOT DETECTED NOT DETECTED Final   Klebsiella pneumoniae NOT DETECTED NOT DETECTED Final   Proteus species NOT DETECTED NOT DETECTED Final   Salmonella species NOT DETECTED NOT DETECTED Final   Serratia marcescens NOT DETECTED NOT DETECTED Final   Haemophilus influenzae NOT DETECTED NOT DETECTED Final   Neisseria meningitidis NOT DETECTED NOT DETECTED Final   Pseudomonas aeruginosa NOT DETECTED NOT DETECTED Final   Stenotrophomonas maltophilia NOT DETECTED  NOT DETECTED Final   Candida albicans NOT DETECTED NOT DETECTED Final   Candida auris NOT DETECTED NOT DETECTED Final   Candida glabrata NOT DETECTED NOT DETECTED Final   Candida krusei NOT DETECTED NOT DETECTED Final   Candida parapsilosis NOT DETECTED NOT DETECTED Final   Candida tropicalis NOT DETECTED NOT DETECTED Final   Cryptococcus neoformans/gattii NOT DETECTED NOT DETECTED Final   CTX-M ESBL NOT DETECTED NOT DETECTED Final   Carbapenem resistance IMP NOT DETECTED NOT DETECTED  Final   Carbapenem resistance KPC NOT DETECTED NOT DETECTED Final   Carbapenem resistance NDM NOT DETECTED NOT DETECTED Final   Carbapenem resist OXA 48 LIKE NOT DETECTED NOT DETECTED Final   Carbapenem resistance VIM NOT DETECTED NOT DETECTED Final    Comment: Performed at [facility], [address], [place], [place] [address]  Culture, blood (Routine X 2) w Reflex to ID Panel     Status: Abnormal (Preliminary result)   Collection Time: 12/01/20 12:46 PM   Specimen: BLOOD  Result Value Ref Range Status   Specimen Description   Final    BLOOD DRAWN BY DIALYSIS DRAWN BY RN Performed at [facility], [address] [place], [place] [address]    Special Requests   Final    BOTTLES DRAWN AEROBIC AND ANAEROBIC Blood Culture adequate volume Performed at [facility], [address], [place], [place] [address]    Culture  Setup Time   Final    GRAM POSITIVE COCCI both anaerobic and aerobic bottles Gram Stain Report Called to,Read Back By and Verified With: Rhew,J@0545  by Matthews,B 10.22.22 Performed at [facility], [address], [place], [place] [address]    Culture (A)  Final    STAPHYLOCOCCUS AUREUS SUSCEPTIBILITIES PERFORMED ON PREVIOUS CULTURE WITHIN THE LAST 5 DAYS. Performed at [facility], [place] [address] [place], [place] [address]    Report Status PENDING  Incomplete  Culture, blood (Routine X 2) w Reflex to ID Panel     Status: Abnormal (Preliminary result)   Collection Time: 12/01/20 12:55 PM   Specimen: BLOOD  Result Value Ref Range Status   Specimen Description   Final    BLOOD DRAWN BY DIALYSIS DRAWN BY RN Performed at [facility], [address] [place], [place] [address]    Special Requests   Final    BOTTLES DRAWN AEROBIC AND ANAEROBIC Blood Culture adequate volume Performed at [facility], [address], [place], [place] [address]    Culture  Setup Time   Final    GRAM POSITIVE COCCI ANAEROBIC BOTTLE ONLY Gram Stain Report Called to,Read Back  By and Verified With: HUMPHRIES @ 1600 ON 102222 BY HENDERSON L CRITICAL VALUE NOTED.  VALUE IS CONSISTENT WITH PREVIOUSLY REPORTED AND CALLED VALUE.    Culture (A)  Final    STAPHYLOCOCCUS AUREUS SUSCEPTIBILITIES PERFORMED ON PREVIOUS CULTURE WITHIN THE LAST 5 DAYS. Performed at [facility], [place] [address], [place], [place] [address]    Report Status PENDING  Incomplete  Culture, blood (routine x 2)     Status: None (Preliminary result)   Collection Time: 12/03/20  1:03 PM   Specimen: BLOOD RIGHT HAND  Result Value Ref Range Status   Specimen Description   Final    BLOOD RIGHT HAND BOTTLES DRAWN AEROBIC AND ANAEROBIC   Special Requests   Final    Blood Culture adequate volume Performed at [facility] [facility], [address], [place], [place] [address]    Culture PENDING  Incomplete   Report Status PENDING  Incomplete  Culture, blood (routine x 2)     Status: None (Preliminary result)   Collection Time: 12/03/20  1:09 PM   Specimen: Right Antecubital; Blood  Result Value Ref Range Status   Specimen Description   Final    RIGHT ANTECUBITAL BOTTLES DRAWN AEROBIC AND ANAEROBIC   Special Requests   Final    Blood Culture adequate volume Performed at [facility], [address], [place], [place] [address]    Culture PENDING  Incomplete   Report Status PENDING  Incomplete     Scheduled Meds:  calcitRIOL  0.5 mcg Oral Q M,W,F   calcium acetate  1,334 mg Oral TID with meals   Chlorhexidine Gluconate Cloth  6 each Topical Q0600   loratadine  10 mg Oral q1800   mouth rinse  15 mL Mouth Rinse BID   mupirocin ointment   Nasal BID   pantoprazole  40 mg Oral Daily   silver sulfADIAZINE   Topical Daily   Continuous Infusions:  ceFEPime (MAXIPIME) IV Stopped (12/01/20 1559)   heparin 2,000 Units/hr (12/03/20 1411)   vancomycin Stopped (12/01/20 1505)    Procedures/Studies: DG Elbow 2 Views Right  Result Date: 11/29/2020 CLINICAL DATA:  Pain and swelling in the right elbow lung  recent fall, initial encounter EXAM: RIGHT ELBOW - 2 VIEW COMPARISON:  None. FINDINGS: Mild degenerative changes of the right elbow joint are seen. No acute fracture or dislocation is noted. Small joint effusion is noted anteriorly. Olecranon spurring is seen. IMPRESSION: Small joint effusion without acute fracture. Electronically Signed   By: Inez Catalina M.D.   On: 11/29/2020 18:58   MR FOOT RIGHT WO CONTRAST  Result Date: 12/01/2020 CLINICAL DATA:  Right foot ulcer. EXAM: MRI OF THE RIGHT FOREFOOT WITHOUT CONTRAST TECHNIQUE: Multiplanar, multisequence MR imaging of the right forefoot was performed. No intravenous contrast was administered. COMPARISON:  Right foot x-rays dated November 27, 2020. FINDINGS: Bones/Joint/Cartilage Faint marrow edema the fifth metatarsal head. Remaining marrow signal is normal. No fracture or dislocation. Mild second TMT joint osteoarthritis. No joint effusion. Ligaments Collateral ligaments are intact.  Lisfranc ligament is intact. Muscles and Tendons Flexor and extensor tendons are intact. Increased T2 signal within the intrinsic muscles of the forefoot, nonspecific, but likely related to diabetic muscle changes. Soft tissue Diffuse soft tissue swelling. Soft tissue ulceration along the lateral forefoot near the fifth metatarsal head with underlying 2.1 x 0.4 x 2.3 cm curvilinear fluid collection (series 8, image 26). No soft tissue mass. IMPRESSION: 1. Soft tissue ulceration along the lateral forefoot near the fifth metatarsal head with underlying 2.3 cm curvilinear fluid collection, concerning for abscess. 2. Faint marrow edema in the fifth metatarsal head is nonspecific, but concerning for early osteomyelitis given adjacent abscess and soft tissue ulcer. Electronically Signed   By: Titus Dubin M.D.   On: 12/01/2020 14:16   DG Chest Port 1 View  Result Date: 11/29/2020 CLINICAL DATA:  Removal of Port-A-Cath EXAM: PORTABLE CHEST 1 VIEW COMPARISON:  11/27/2020 FINDINGS:  There is been interval removal of hemodialysis catheter on the right. No visible retained fragments. Prior CABG. Cardiomegaly. Vascular congestion. Bilateral layering effusions and lower lobe airspace opacity. Probable mild edema/CHF. IMPRESSION: Interval removal of right hemodialysis catheter. CHF.  Layering effusions. Electronically Signed   By: Rolm Baptise M.D.   On: 11/29/2020 16:58   DG Chest Port 1 View  Result Date: 11/27/2020 CLINICAL DATA:  Fall.  Chest and right foot  pain. EXAM: PORTABLE CHEST 1 VIEW COMPARISON:  09/01/2020 FINDINGS: Unchanged mild cardiomegaly. Unchanged mild pulmonary vascular congestion. Small bilateral pleural effusions are not significantly changed since prior examination. Interval placement of dialysis catheter. Median sternotomy changes again noted. IMPRESSION: No acute abnormality of the chest. Unchanged findings of mild CHF/fluid volume overload. Electronically Signed   By: Miachel Roux M.D.   On: 11/27/2020 11:24   DG Foot 2 Views Right  Result Date: 11/27/2020 CLINICAL DATA:  wound to lateral fore foot EXAM: RIGHT FOOT - 2 VIEW COMPARISON:  Radiograph 08/18/2020 FINDINGS: There is no evidence of acute fracture or dislocation. Unchanged bulky ossification along the base of the fifth metatarsal, possibly from prior trauma. There is a lateral forefoot soft tissue defect. There is no frank bony destruction radiographically. Mild midfoot and forefoot degenerative changes. Plantar and dorsal calcaneal spurring. Vascular calcifications. IMPRESSION: Lateral forefoot soft tissue defect. No frank bony destruction radiographically to suggest osteomyelitis. MRI would be more sensitive. Electronically Signed   By: Maurine Simmering M.D.   On: 11/27/2020 11:32   ECHOCARDIOGRAM LIMITED  Result Date: 11/28/2020    ECHOCARDIOGRAM LIMITED REPORT   Patient Name:   JOHNELL BAS [facility] Date of Exam: 11/28/2020 Medical Rec #:  [id number]           Height:       74.0 in Accession #:     [id number]          Weight:       261.9 lb Date of Birth:  Jul 25, 1950           BSA:          2.437 m Patient Age:    43 years            BP:           124/56 mmHg Patient Gender: M                   HR:           72 bpm. Exam Location:  [facility] Procedure: Limited Echo, Color Doppler and Cardiac Doppler Indications:    Dyspnea R06.00  History:        Patient has prior history of Echocardiogram examinations, most                 recent 06/06/2020. CAD, Prior CABG, Arrythmias:Atrial                 Fibrillation, Signs/Symptoms:Shortness of Breath; Risk                 Factors:Hypertension, Diabetes and Dyslipidemia.  Sonographer:    Alvino Chapel RCS Referring Phys: [id number] [place]  1. Left ventricular ejection fraction, by estimation, is 45 to 50%. The left ventricle has mildly decreased function. The left ventricle demonstrates regional wall motion abnormalities (see scoring diagram/findings for description). There is severe asymmetric left ventricular hypertrophy of the basal and septal segments. Left ventricular diastolic parameters are indeterminate. There is the interventricular septum is flattened in systole and diastole, consistent with right ventricular pressure and volume overload.  2. Right ventricular systolic function is low normal. The right ventricular size is mildly enlarged. There is moderately elevated pulmonary artery systolic pressure. The estimated right ventricular systolic pressure is 14.4 mmHg.  3. Left atrial size was mildly dilated.  4. Right atrial size was mildly dilated.  5. Left pleural effusion evident. A small pericardial effusion is present. The pericardial effusion is posterior to  the left ventricle.  6. The mitral valve is abnormal. Mild mitral valve regurgitation.  7. The aortic valve is tricuspid. There is moderate calcification of the aortic valve. Aortic valve regurgitation is not visualized.  8. The inferior vena cava is dilated in size with <50%  respiratory variability, suggesting right atrial pressure of 15 mmHg. Comparison(s): Prior images reviewed side by side. Reduced LVEF and wall motion abnormalities are not new based on my review of the prior study. FINDINGS  Left Ventricle: Left ventricular ejection fraction, by estimation, is 45 to 50%. The left ventricle has mildly decreased function. The left ventricle demonstrates regional wall motion abnormalities. Definity contrast agent was given IV to delineate the left ventricular endocardial borders. There is severe asymmetric left ventricular hypertrophy of the basal and septal segments. The interventricular septum is flattened in systole and diastole, consistent with right ventricular pressure and volume overload. Left ventricular diastolic parameters are indeterminate. Left ventricular diastolic function could not be evaluated due to atrial fibrillation.  LV Wall Scoring: The basal inferior segment is akinetic. The posterior wall, mid inferoseptal segment, mid inferior segment, and basal inferoseptal segment are hypokinetic. The entire anterior wall, antero-lateral wall, entire anterior septum, and entire apex are normal. Right Ventricle: The right ventricular size is mildly enlarged. No increase in right ventricular wall thickness. Right ventricular systolic function is low normal. There is moderately elevated pulmonary artery systolic pressure. The tricuspid regurgitant  velocity is 3.12 m/s, and with an assumed right atrial pressure of 15 mmHg, the estimated right ventricular systolic pressure is 35.4 mmHg. Left Atrium: Left atrial size was mildly dilated. Right Atrium: Right atrial size was mildly dilated. Pericardium: Left pleural effusion evident. A small pericardial effusion is present. The pericardial effusion is posterior to the left ventricle. Mitral Valve: The mitral valve is abnormal. There is mild thickening of the mitral valve leaflet(s). There is mild calcification of the mitral valve  leaflet(s). Mild mitral valve regurgitation. Tricuspid Valve: The tricuspid valve is grossly normal. Tricuspid valve regurgitation is mild. Aortic Valve: The aortic valve is tricuspid. There is moderate calcification of the aortic valve. There is mild to moderate aortic valve annular calcification. Aortic valve regurgitation is not visualized. Aorta: The aortic root is normal in size and structure. Venous: The inferior vena cava is dilated in size with less than 50% respiratory variability, suggesting right atrial pressure of 15 mmHg. IAS/Shunts: No atrial level shunt detected by color flow Doppler. LEFT VENTRICLE PLAX 2D LVIDd:         5.50 cm LVIDs:         4.30 cm LV PW:         1.30 cm LV IVS:        1.80 cm LVOT diam:     2.30 cm LVOT Area:     4.15 cm  RIGHT VENTRICLE TAPSE (M-mode): 1.7 cm LEFT ATRIUM              Index        RIGHT ATRIUM           Index LA diam:        4.20 cm  1.72 cm/m   RA Area:     27.30 cm LA Vol (A2C):   102.0 ml 41.85 ml/m  RA Volume:   96.50 ml  39.59 ml/m LA Vol (A4C):   74.5 ml  30.57 ml/m LA Biplane Vol: 91.8 ml  37.66 ml/m   AORTA Ao Root diam: 3.80 cm MITRAL VALVE  TRICUSPID VALVE MV Area (PHT): 5.02 cm     TR Peak grad:   38.9 mmHg MV Decel Time: 151 msec     TR Vmax:        312.00 cm/s MV E velocity: 143.00 cm/s                             SHUNTS                             Systemic Diam: 2.30 cm Rozann Lesches MD Electronically signed by Rozann Lesches MD Signature Date/Time: 11/28/2020/1:03:27 PM    Final     Orson Eva, DO  [facility]  If 7PM-7AM, please contact night-coverage [email or web address] Password [email or web address] 12/03/2020, 3:39 PM   LOS: 6 days

## 2020-12-04 ENCOUNTER — Encounter (HOSPITAL_COMMUNITY): Payer: Self-pay | Admitting: Internal Medicine

## 2020-12-04 ENCOUNTER — Encounter (HOSPITAL_COMMUNITY)
Admission: EM | Disposition: A | Discharge status: Skilled Nursing Facility | Payer: Self-pay | Source: Home / Self Care | Attending: Internal Medicine

## 2020-12-04 ENCOUNTER — Other Ambulatory Visit (HOSPITAL_COMMUNITY): Payer: PPO

## 2020-12-04 ENCOUNTER — Inpatient Hospital Stay (HOSPITAL_COMMUNITY): Payer: PPO

## 2020-12-04 DIAGNOSIS — R7989 Other specified abnormal findings of blood chemistry: Secondary | ICD-10-CM

## 2020-12-04 DIAGNOSIS — R7881 Bacteremia: Secondary | ICD-10-CM

## 2020-12-04 DIAGNOSIS — I4819 Other persistent atrial fibrillation: Secondary | ICD-10-CM

## 2020-12-04 LAB — COMPREHENSIVE METABOLIC PANEL
ALT: 180 U/L — ABNORMAL HIGH (ref 0–44)
AST: 138 U/L — ABNORMAL HIGH (ref 15–41)
Albumin: 2.6 g/dL — ABNORMAL LOW (ref 3.5–5.0)
Alkaline Phosphatase: 194 U/L — ABNORMAL HIGH (ref 38–126)
Anion gap: 15 (ref 5–15)
BUN: 97 mg/dL — ABNORMAL HIGH (ref 8–23)
CO2: 23 mmol/L (ref 22–32)
Calcium: 8.1 mg/dL — ABNORMAL LOW (ref 8.9–10.3)
Chloride: 91 mmol/L — ABNORMAL LOW (ref 98–111)
Creatinine, Ser: 7.55 mg/dL — ABNORMAL HIGH (ref 0.61–1.24)
GFR, Estimated: 7 mL/min — ABNORMAL LOW (ref >60)
Glucose, Bld: 283 mg/dL — ABNORMAL HIGH (ref 70–99)
Potassium: 4.2 mmol/L (ref 3.5–5.1)
Sodium: 129 mmol/L — ABNORMAL LOW (ref 135–145)
Total Bilirubin: 0.8 mg/dL (ref 0.3–1.2)
Total Protein: 6 g/dL — ABNORMAL LOW (ref 6.5–8.1)

## 2020-12-04 LAB — CBC
HCT: 31.5 % — ABNORMAL LOW (ref 39.0–52.0)
Hemoglobin: 9.7 g/dL — ABNORMAL LOW (ref 13.0–17.0)
MCH: 27.4 pg (ref 26.0–34.0)
MCHC: 30.8 g/dL (ref 30.0–36.0)
MCV: 89 fL (ref 80.0–100.0)
Platelets: 214 10*3/uL (ref 150–400)
RBC: 3.54 MIL/uL — ABNORMAL LOW (ref 4.22–5.81)
RDW: 18.3 % — ABNORMAL HIGH (ref 11.5–15.5)
WBC: 13.9 10*3/uL — ABNORMAL HIGH (ref 4.0–10.5)
nRBC: 0.2 % (ref 0.0–0.2)

## 2020-12-04 LAB — CULTURE, BLOOD (ROUTINE X 2)
Special Requests: ADEQUATE
Special Requests: ADEQUATE

## 2020-12-04 LAB — APTT
aPTT: 48 seconds — ABNORMAL HIGH (ref 24–36)
aPTT: 30 seconds (ref 24–36)

## 2020-12-04 LAB — HEPARIN LEVEL (UNFRACTIONATED): Heparin Unfractionated: 0.57 IU/mL (ref 0.30–0.70)

## 2020-12-04 SURGERY — ECHOCARDIOGRAM, TRANSESOPHAGEAL
Anesthesia: Monitor Anesthesia Care

## 2020-12-04 MED ORDER — SILVER SULFADIAZINE 1 % EX CREA
TOPICAL_CREAM | Freq: Two times a day (BID) | CUTANEOUS | Status: DC
  Filled 2020-12-04: qty 85

## 2020-12-04 MED ORDER — HEPARIN BOLUS VIA INFUSION
2000.0000 [IU] | Freq: Once | INTRAVENOUS | Status: AC
  Administered 2020-12-04: 2000 [IU] via INTRAVENOUS
  Filled 2020-12-04: qty 2000

## 2020-12-04 NOTE — Progress Notes (Signed)
Critical blood culture results received from [place] in lab.  Dr Tat notified.

## 2020-12-04 NOTE — Progress Notes (Signed)
ANTICOAGULATION CONSULT NOTE  Pharmacy Consult for Heparin Indication: hx DVT/afib  Allergies  Allergen Reactions   Latex Hives and Rash   Lipitor [Atorvastatin]     Leg aches     Patient Measurements: Height: 6\' 2"  (188 cm) Weight: 111.9 kg (246 lb 11.1 oz) IBW/kg (Calculated) : 82.2 HEPARIN DW (KG): 107.6   Vital Signs: Temp: 98 F (36.7 C) (10/23 2056) Temp Source: Oral (10/23 2056) BP: 136/68 (10/23 2056) Pulse Rate: 74 (10/23 2056)  Labs: Recent Labs    12/01/20 0538 12/01/20 1643 12/02/20 0435 12/03/20 0200 12/03/20 0447 12/03/20 1039 12/03/20 1904  HGB 9.3*  --  9.8*  --  9.4*  --   --   HCT 30.3*  --  32.9*  --  31.7*  --   --   PLT 166  --  178  --  207  --   --   APTT  --    < >  --  44* 45* 40* 43*  HEPARINUNFRC  --    < >  --  >1.10* >1.10* 0.99*  --   CREATININE 6.35*  --   --   --   --   --   --    < > = values in this interval not displayed.     Estimated Creatinine Clearance: 14.6 mL/min (A) (by C-G formula based on SCr of 6.35 mg/dL (H)).   Assessment: 70 y.o. male with h/o DVT and Afib, Eliquis on hold, for heparin.HL still not correlating with APTT   10/23 PM update:  aPTT low, 43 seconds    Goal of Therapy:  APTT 66-102 sec Heparin level 0.3-0.7 units/ml Monitor platelets by anticoagulation protocol: Yes   Plan:  Heparin 2000 unit bolus  Increase heparin to 2300 units/hr 1000 heparin level and aPTT  Donna Christen Zainah Steven, PharmD, MBA, BCGP Clinical Pharmacist

## 2020-12-04 NOTE — Procedures (Signed)
HEMODIALYSIS TREATMENT NOTE:   4 hour HD session completed.  AVF was cannulated with 17g needles and initially tolerated Qb 265 cc/min but flow was gradually decreased in response to rising venous pressures.  Average Qb 200 and average VP 200.  No evidence of infiltration.  Circuit was free of clots.  Will continue to use AVF as able, hoping to avoid [facility] placement.  Goal met: 3 liters removed.  Hemodynamically stable.  Heparin infusion was discontinued at 1200 per Dr. Doristine Devoid v.o. as pt is scheduled for TEE this afternoon.    Vancomycin and Cefepime were given during last 1.5 hours of treatment.  All blood was returned and hemostasis was achieved in 15 minutes.  Pt was collected from the HD unit by ultrasound technician for abd u/s prior to TEE.  Rockwell Alexandria, RN

## 2020-12-04 NOTE — Progress Notes (Signed)
Patient ID: Travis Blair, male   DOB: [birth year], 70 y.o.   MRN: [id number] S: Feels well, no complaints.  Blood culture from 10/23 still + with GNR. O:BP 130/63 (BP Location: Right Arm)   Pulse 79   Temp 98 F (36.7 C) (Oral)   Resp 19   Ht 6\' 2"  (1.88 m)   Wt 114.6 kg   SpO2 95%   BMI 32.44 kg/m   Intake/Output Summary (Last 24 hours) at 12/04/2020 0839 Last data filed at 12/03/2020 1500 Gross per 24 hour  Intake 1733.41 ml  Output --  Net 1733.41 ml   Intake/Output: I/O last 3 completed shifts: In: 1733.4 [P.O.:1360; I.V.:373.4] Out: -   Intake/Output this shift:  No intake/output data recorded. Weight change: 1.3 kg Gen:NAD CVS: RRR Resp: CTA Abd: +BS, soft, NT/ND Ext: 1+ pretibial edema bilaterally, LUE AVF +T/B  Recent Labs  Lab 11/27/20 1008 11/27/20 1711 11/28/20 0449 11/29/20 0440 11/30/20 0453 12/01/20 0538 12/02/20 0435 12/04/20 0341  NA 136 135 135 131* 133* 130*  --  129*  K 4.4 3.8 3.9 4.1 3.9 4.3  --  4.2  CL 100 100 99 95* 97* 93*  --  91*  CO2 22 24 26 26 25 24   --  23  GLUCOSE 178* 178* 126* 138* 120* 162*  --  283*  BUN 69* 74* 41* 60* 49* 69*  --  97*  CREATININE 6.38* 6.75* 4.38* 6.10* 5.11* 6.35*  --  7.55*  ALBUMIN  --  2.6*  --   --  2.5* 2.5* 3.1* 2.6*  CALCIUM 8.4* 8.0* 8.1* 7.9* 8.0* 8.4*  --  8.1*  PHOS  --  4.4  --   --  3.2 3.9  --   --   AST  --   --   --   --   --   --  347* 138*  ALT  --   --   --   --   --   --  130* 180*   Liver Function Tests: Recent Labs  Lab 12/01/20 0538 12/02/20 0435 12/04/20 0341  AST  --  347* 138*  ALT  --  130* 180*  ALKPHOS  --  174* 194*  BILITOT  --  1.6* 0.8  PROT  --  6.9 6.0*  ALBUMIN 2.5* 3.1* 2.6*   No results for input(s): LIPASE, AMYLASE in the last 168 hours. No results for input(s): AMMONIA in the last 168 hours. CBC: Recent Labs  Lab 11/30/20 0453 12/01/20 0538 12/02/20 0435 12/03/20 0447 12/04/20 0341  WBC 14.0* 14.1* 11.3* 12.0* 13.9*  HGB 9.0* 9.3* 9.8*  9.4* 9.7*  HCT 29.7* 30.3* 32.9* 31.7* 31.5*  MCV 90.8 90.4 91.4 91.1 89.0  PLT 163 166 178 207 214   Cardiac Enzymes: No results for input(s): CKTOTAL, CKMB, CKMBINDEX, TROPONINI in the last 168 hours. CBG: Recent Labs  Lab 11/27/20 2216 11/29/20 2009 12/02/20 1419 12/02/20 1544  GLUCAP 128* 130* 137* 135*    Iron Studies: No results for input(s): IRON, TIBC, TRANSFERRIN, FERRITIN in the last 72 hours. Studies/Results: No results found.  calcitRIOL  0.5 mcg Oral Q M,W,F   calcium acetate  1,334 mg Oral TID with meals   Chlorhexidine Gluconate Cloth  6 each Topical Q0600   loratadine  10 mg Oral q1800   mouth rinse  15 mL Mouth Rinse BID   mupirocin ointment   Nasal BID   pantoprazole  40 mg Oral Daily   silver sulfADIAZINE  Topical Daily    BMET    Component Value Date/Time   NA 129 (L) 12/04/2020 0341   NA 138 09/02/2019 1331   K 4.2 12/04/2020 0341   CL 91 (L) 12/04/2020 0341   CO2 23 12/04/2020 0341   GLUCOSE 283 (H) 12/04/2020 0341   BUN 97 (H) 12/04/2020 0341   BUN 51 (H) 09/02/2019 1331   CREATININE 7.55 (H) 12/04/2020 0341   CREATININE 3.49 (H) 05/23/2020 1426   CALCIUM 8.1 (L) 12/04/2020 0341   GFRNONAA 7 (L) 12/04/2020 0341   GFRNONAA 17 (L) 05/23/2020 1426   GFRAA 20 (L) 05/23/2020 1426   CBC    Component Value Date/Time   WBC 13.9 (H) 12/04/2020 0341   RBC 3.54 (L) 12/04/2020 0341   HGB 9.7 (L) 12/04/2020 0341   HGB 11.6 (L) 09/02/2019 1331   HCT 31.5 (L) 12/04/2020 0341   HCT 37.8 09/02/2019 1331   PLT 214 12/04/2020 0341   PLT 231 09/02/2019 1331   MCV 89.0 12/04/2020 0341   MCV 86 09/02/2019 1331   MCH 27.4 12/04/2020 0341   MCHC 30.8 12/04/2020 0341   RDW 18.3 (H) 12/04/2020 0341   RDW 14.6 09/02/2019 1331   LYMPHSABS 0.8 09/01/2020 2140   LYMPHSABS 1.4 09/02/2019 1331   MONOABS 0.9 09/01/2020 2140   EOSABS 0.1 09/01/2020 2140   EOSABS 0.2 09/02/2019 1331   BASOSABS 0.0 09/01/2020 2140   BASOSABS 0.0 09/02/2019 1331     Dialysis Orders: Center: [facility]  on MWF . EDW 120.5kg HD [place] 3K/2.5Ca  Time 4 hours Heparin 2000 units bolus. Access RIJ TDC (maturing LUE AVF) BFR 400 DFR 800    Calcitriol 0.5 mcg po/HD Venofer  100 mg TIW, Micera 100 mcg IVP every 2 weeks      Assessment/Plan:  Acute hypoxic respiratory failure - presumably due to volume overload and acute diastolic CHF.  Improved with ongoing HD and UF.   Will continue to challenge edw (already 5 kg below edw).  ESRD -  continue with HD MWF while he remains an inpatient.  Hypertension/volume  - as above, will continue to challenge edw.  Anemia  - stable  Metabolic bone disease -  continue with home meds  Nutrition -  renal diet, carb modified.  Chronic atrial fibrillation - rate controlled History of DVT/PE - to resume apixaban. ID  - had fever with + blood cultures, initially for MRSA and now E. Coli (12/03/20).  TDC removed 11/29/20 and on IV cefepime and Vancomycin.  Continue to follow. Vascular access - TDC removed 10/19 and using LUE AVF but has high venous pressures.  May need a new [facility] tomorrow depending upon its function today with HD.   Donetta Potts, MD [employer] [phone]

## 2020-12-04 NOTE — Progress Notes (Signed)
Lab notified staff of positive blood cultures (aerobic gram negative rods). No new orders received at this time.

## 2020-12-04 NOTE — Progress Notes (Signed)
PROGRESS NOTE  Travis Blair WGY:[id number] DOB: February 03, 1951 DOA: 11/27/2020 PCP: [name], MD  Brief History:   70 y.o. male with medical history significant of CHF, ERSD on HD (M-W-F), CAD, DVT/PE, and T2DM, CABG with post operative atrial fibrillation, pulmonary hypertension who presented with dyspnea.  Patient reported having cough for about 2 weeks, persistent and worsening.  Tried several antitussive agents at home without improvement of symptoms.  He went to dialysis on Friday, oct 14. He e was noted to be dyspneic by his wife, moderate in intensity, associated with cough.  He has had a chronic cough for at least one year, worse over last week prior to [place].  He took some robitussin and codeine from an old prescription on 11/26/20.  On Monday, he was more unsteady and fell in the BR.  He had subjective f/c at home since 11/24/20.  He denied n/v/d, abd pain.     His main symptom seems to be increased work of breathing/dyspnea, moderate to severe in intensity, worse with exertion, associated with cough, no chest pain, no improving factors. Last night not able to sleep due to persistent cough, dry.  He continues to make urine 3 times daily.   In June 2022 he decided to stop apixaban due to weakness, he was placed on clopidogrel. Last year he had COVID-pneumonia and pulm embolism. About 3 months ago he was started on hemodialysis, he has a fistula which is not mature yet.  He uses a tunneled dialysis catheter for dialysis.  Since admission, nephrology was consulted to assist.  He was dialyzed on 10/17 and 10/19.  He was found to have MRSA bacteremia.  General surgery was consulted to remove his Permacath.  He was started on IV vancomycin 11/29/20.  Repeat blood culture on 10/20 showed Ecoli, and he was started on cefepime.   Assessment/Plan: Sepsis  -present on admission -had fever and leukocytosis -due to MRSA bacteremia -sepsis physiology resolved   MRSA  bacteremia/EColi Bacteremia -removed tunneled HD catheter 10/19 after HD -10/18 blood culture = MRSA (peripheral) -10/20 blood culture = E Coli (peripheral) -12/01/20 blood culture = S aureus (on HD via fistula) -12/03/20 blood culture = GNR (peripheral) -11/28/20 TTE--no obvious vegetation -able to to use fistula for HD 10/21 -consult cardiology for TEE>>tentatively scheduled for 10/24 -will need to delay permacath placement   EColi Bacteremia -continue cefepime on HD>>plan to switch to cefazolin on HD based on susceptibilities -10/20 blood culture = EColi--no MRSA -10/23 blood culture= GNR   Right Diabetic foot ulcerAcute Osteomyelitis 5th metatarsal -spouse states wound is improving with care at home -MR right foot--Soft tissue ulceration along the lateral forefoot near the fifth metatarsal head with underlying 2.3 cm curvilinear fluid collection,concerning for abscess -consult wound care -discussed with surgery--foot debrided 10/22>>no pus -10/24 wider foot debridement with small amount purulence -appreciate general surgery, Dr. Constance Haw   Acute respiratory failure with hypoxia -due to pulmonary edema -presented with oxygen saturation 87% on RA and tachypnea -stable on 3L -wean off oxygen for saturation >92%   Acute on chronic diastolic CHF/Acute systolic CHF -7/79/39 Echo 60-65%, no WMA; RV systolic function preserved.  RSVP 57.3.  No significant valvular disease. -fluid removal on HD -11/28/20 Echo EF 45-50%+WMA, RV overload, no vegetation -stable on 2L   Dysphagia -12/02/20 EGD--no esophageal abnormality to explain dysphagia; dilated; mild portal hypertensive gastropathy -suspect he has esophageal dysmotility from his medical issues -swallowing better since dilatation  ESRD -appreciate renal consult -HD on 10/17, 10/19, 10/21, 10/24 -10/21, 10/24--able to use fistula   History DVT/PE -hold apixaban temporarily in preparation for new catheter insertion   Chronic  Afib -AC as discussed above -continue IV heparin   Uncontrolled Diabetes mellitus type 2 with hyperglycemia -08/18/20 A1C--8.0 -CBGs controlled so far -novolog sliding scale -10/19- A1C 6.3 -CBGs controlled   Right Elbow pain -due to recent fall on 11/27/20 -xray--no fracture; small anterior joint eff -improved--no further synovitis   Transaminasemia -likely due to sepsis and hemodynamic changes -trending down -f/u abd [place] -repeat LFTs in am       Status is: Inpatient   Remains inpatient appropriate because: severity of illness               Family Communication:   spouse updated 10/24   Consultants:  renal, general surgery, GI, ID   Code Status:  FULL   DVT Prophylaxis:  IV Heparin      Procedures: As Listed in Progress Note Above   Antibiotics: Vanco 10/19>> Cefepime 10/21>>    Subjective: Patient has intermittent low back pain due to positioning.  Denies f/c, cp, sob, nv/d, abd pain.    Objective: Vitals:   12/04/20 1245 12/04/20 1300 12/04/20 1315 12/04/20 1439  BP: 140/71 130/70 128/72 (!) 121/54  Pulse: 86 70 72 60  Resp:   16 17  Temp:    97.6 F (36.4 C)  TempSrc:    Oral  SpO2:   96% 93%  Weight:      Height:        Intake/Output Summary (Last 24 hours) at 12/04/2020 1559 Last data filed at 12/04/2020 1305 Gross per 24 hour  Intake --  Output 3050 ml  Net -3050 ml   Weight change: 1.3 kg Exam:  General:  Pt is alert, follows commands appropriately, not in acute distress HEENT: No icterus, No thrush, No neck mass, [place]/AT Cardiovascular: IRRR, S1/S2, no rubs, no gallops Respiratory: bibasilar crackles. No wheeze Abdomen: Soft/+BS, non tender, non distended, no guarding Extremities: 1 + LE edema, No lymphangitis, No petechiae, No rashes, no synovitis   Data Reviewed: I have personally reviewed following labs and imaging studies Basic Metabolic Panel: Recent Labs  Lab 11/27/20 1711 11/28/20 0449 11/29/20 0440  11/30/20 0453 12/01/20 0538 12/04/20 0341  NA 135 135 131* 133* 130* 129*  K 3.8 3.9 4.1 3.9 4.3 4.2  CL 100 99 95* 97* 93* 91*  CO2 24 26 26 25 24 23   GLUCOSE 178* 126* 138* 120* 162* 283*  BUN 74* 41* 60* 49* 69* 97*  CREATININE 6.75* 4.38* 6.10* 5.11* 6.35* 7.55*  CALCIUM 8.0* 8.1* 7.9* 8.0* 8.4* 8.1*  PHOS 4.4  --   --  3.2 3.9  --    Liver Function Tests: Recent Labs  Lab 11/27/20 1711 11/30/20 0453 12/01/20 0538 12/02/20 0435 12/04/20 0341  AST  --   --   --  347* 138*  ALT  --   --   --  130* 180*  ALKPHOS  --   --   --  174* 194*  BILITOT  --   --   --  1.6* 0.8  PROT  --   --   --  6.9 6.0*  ALBUMIN 2.6* 2.5* 2.5* 3.1* 2.6*   No results for input(s): LIPASE, AMYLASE in the last 168 hours. No results for input(s): AMMONIA in the last 168 hours. Coagulation Profile: No results for input(s): INR, PROTIME in the last  168 hours. CBC: Recent Labs  Lab 11/30/20 0453 12/01/20 0538 12/02/20 0435 12/03/20 0447 12/04/20 0341  WBC 14.0* 14.1* 11.3* 12.0* 13.9*  HGB 9.0* 9.3* 9.8* 9.4* 9.7*  HCT 29.7* 30.3* 32.9* 31.7* 31.5*  MCV 90.8 90.4 91.4 91.1 89.0  PLT 163 166 178 207 214   Cardiac Enzymes: No results for input(s): CKTOTAL, CKMB, CKMBINDEX, TROPONINI in the last 168 hours. BNP: Invalid input(s): POCBNP CBG: Recent Labs  Lab 11/27/20 2216 11/29/20 2009 12/02/20 1419 12/02/20 1544  GLUCAP 128* 130* 137* 135*   HbA1C: No results for input(s): HGBA1C in the last 72 hours. Urine analysis:    Component Value Date/Time   COLORURINE YELLOW 03/10/2017 1553   APPEARANCEUR HAZY (A) 03/10/2017 1553   LABSPEC 1.013 03/10/2017 1553   PHURINE 5.0 03/10/2017 1553   GLUCOSEU NEGATIVE 03/10/2017 1553   HGBUR NEGATIVE 03/10/2017 1553   BILIRUBINUR NEGATIVE 03/10/2017 1553   KETONESUR NEGATIVE 03/10/2017 1553   PROTEINUR 30 (A) 03/10/2017 1553   NITRITE NEGATIVE 03/10/2017 1553   LEUKOCYTESUR NEGATIVE 03/10/2017 1553   Sepsis  Labs: @LABRCNTIP (procalcitonin:4,lacticidven:4) ) Recent Results (from the past 240 hour(s))  Resp Panel by RT-PCR (Flu A&B, Covid) Nasopharyngeal Swab     Status: None   Collection Time: 11/27/20 10:09 AM   Specimen: Nasopharyngeal Swab; Nasopharyngeal(NP) swabs in vial transport medium  Result Value Ref Range Status   SARS Coronavirus 2 by RT PCR NEGATIVE NEGATIVE Final    Comment: (NOTE) SARS-CoV-2 target nucleic acids are NOT DETECTED.  The SARS-CoV-2 RNA is generally detectable in upper respiratory specimens during the acute phase of infection. The lowest concentration of SARS-CoV-2 viral copies this assay can detect is 138 copies/mL. A negative result does not preclude SARS-Cov-2 infection and should not be used as the sole basis for treatment or other patient management decisions. A negative result may occur with  improper specimen collection/handling, submission of specimen other than nasopharyngeal swab, presence of viral mutation(s) within the areas targeted by this assay, and inadequate number of viral copies(<138 copies/mL). A negative result must be combined with clinical observations, patient history, and epidemiological information. The expected result is Negative.  Fact Sheet for Patients:  EntrepreneurPulse.com.au  Fact Sheet for Healthcare Providers:  IncredibleEmployment.be  This test is no t yet approved or cleared by the [place] FDA and  has been authorized for detection and/or diagnosis of SARS-CoV-2 by FDA under an Emergency Use Authorization (EUA). This EUA will remain  in effect (meaning this test can be used) for the duration of the COVID-19 declaration under Section 564(b)(1) of the Act, 21 U.S.C.section 360bbb-3(b)(1), unless the authorization is terminated  or revoked sooner.       Influenza A by PCR NEGATIVE NEGATIVE Final   Influenza B by PCR NEGATIVE NEGATIVE Final    Comment: (NOTE) The Xpert Xpress  SARS-CoV-2/FLU/RSV plus assay is intended as an aid in the diagnosis of influenza from Nasopharyngeal swab specimens and should not be used as a sole basis for treatment. Nasal washings and aspirates are unacceptable for Xpert Xpress SARS-CoV-2/FLU/RSV testing.  Fact Sheet for Patients: EntrepreneurPulse.com.au  Fact Sheet for Healthcare Providers: IncredibleEmployment.be  This test is not yet approved or cleared by the [place] FDA and has been authorized for detection and/or diagnosis of SARS-CoV-2 by FDA under an Emergency Use Authorization (EUA). This EUA will remain in effect (meaning this test can be used) for the duration of the COVID-19 declaration under Section 564(b)(1) of the Act, 21 U.S.C. section 360bbb-3(b)(1), unless the  authorization is terminated or revoked.  Performed at [facility], [address] [place], [place] [address]   MRSA Next Gen by PCR, Nasal     Status: Abnormal   Collection Time: 11/27/20  4:10 PM   Specimen: Nasal Mucosa; Nasal Swab  Result Value Ref Range Status   MRSA by PCR Next Gen DETECTED (A) NOT DETECTED Final    Comment: RESULT CALLED TO, READ BACK BY AND VERIFIED WITH: HEATHER TETREAULT,1951,11/27/2020,SELF S. (NOTE) The GeneXpert MRSA Assay (FDA approved for NASAL specimens only), is one component of a comprehensive MRSA colonization surveillance program. It is not intended to diagnose MRSA infection nor to guide or monitor treatment for MRSA infections. Test performance is not FDA approved in patients less than 13 years old. Performed at [facility], [address], [place], [place] [address]   Culture, blood (Routine X 2) w Reflex to ID Panel     Status: Abnormal   Collection Time: 11/28/20  9:34 AM   Specimen: BLOOD RIGHT HAND  Result Value Ref Range Status   Specimen Description   Final    BLOOD RIGHT HAND Performed at [facility], [address], [place], [place] [address]    Special  Requests   Final    Immunocompromised Blood Culture adequate volume BOTTLES DRAWN AEROBIC AND ANAEROBIC Performed at [facility], [address], [place], [place] [address]    Culture  Setup Time   Final    GRAM POSITIVE COCCI IN BOTH AEROBIC AND ANAEROBIC BOTTLES Gram Stain Report Called to,Read Back By and Verified WithLynwood Dawley, 2216, 11/28/2020,SELF S aerobic bottle previously called 10.18.22 2353 matthews, b  Performed at [facility], [address], [place], [place] [address]    Culture (A)  Final    STAPHYLOCOCCUS AUREUS SUSCEPTIBILITIES PERFORMED ON PREVIOUS CULTURE WITHIN THE LAST 5 DAYS. Performed at [facility], [place] [address] [place], [place] [address]    Report Status 12/02/2020 FINAL  Final  Culture, blood (Routine X 2) w Reflex to ID Panel     Status: Abnormal   Collection Time: 11/28/20  9:34 AM   Specimen: BLOOD RIGHT FOREARM  Result Value Ref Range Status   Specimen Description   Final    BLOOD RIGHT FOREARM Performed at [facility], [address] [place], [place] [address]    Special Requests   Final    Immunocompromised Blood Culture adequate volume BOTTLES DRAWN AEROBIC AND ANAEROBIC Performed at [facility], [address], [place], [place] [address]    Culture  Setup Time   Final    GRAM POSITIVE COCCI IN BOTH AEROBIC AND ANAEROBIC BOTTLES Gram Stain Report Called to,Read Back By and Verified With: T.ALLEN,2236,11/28/2020,SELF S aerobic bottle g[facility] previously called matthews, b 10.18.22 2355 CRITICAL RESULT CALLED TO, READ BACK BY AND VERIFIED WITH: T ALLEN,RN@0209  11/29/20 [place]    Culture (A)  Final    METHICILLIN RESISTANT STAPHYLOCOCCUS AUREUS VIRIDANS STREPTOCOCCUS THE SIGNIFICANCE OF ISOLATING THIS ORGANISM FROM A SINGLE SET OF BLOOD CULTURES WHEN MULTIPLE SETS ARE DRAWN IS UNCERTAIN. PLEASE NOTIFY THE MICROBIOLOGY DEPARTMENT WITHIN ONE WEEK IF SPECIATION AND SENSITIVITIES ARE REQUIRED. Performed at [facility], [place] [address], [place],  [address]    Report Status 12/02/2020 FINAL  Final   Organism ID, Bacteria METHICILLIN RESISTANT STAPHYLOCOCCUS AUREUS  Final      Susceptibility   Methicillin resistant staphylococcus aureus - MIC*    CIPROFLOXACIN >=8 RESISTANT Resistant     ERYTHROMYCIN >=8 RESISTANT  Resistant     GENTAMICIN <=0.5 SENSITIVE Sensitive     OXACILLIN >=4 RESISTANT Resistant     TETRACYCLINE <=1 SENSITIVE Sensitive     VANCOMYCIN 1 SENSITIVE Sensitive     TRIMETH/SULFA <=10 SENSITIVE Sensitive     CLINDAMYCIN >=8 RESISTANT Resistant     RIFAMPIN <=0.5 SENSITIVE Sensitive     Inducible Clindamycin NEGATIVE Sensitive     * METHICILLIN RESISTANT STAPHYLOCOCCUS AUREUS  Blood Culture ID Panel (Reflexed)     Status: Abnormal   Collection Time: 11/28/20  9:34 AM  Result Value Ref Range Status   Enterococcus faecalis NOT DETECTED NOT DETECTED Final   Enterococcus Faecium NOT DETECTED NOT DETECTED Final   Listeria monocytogenes NOT DETECTED NOT DETECTED Final   Staphylococcus species DETECTED (A) NOT DETECTED Final    Comment: CRITICAL RESULT CALLED TO, READ BACK BY AND VERIFIED WITH: T ALLEN,RN@0209  11/29/20 [place]    Staphylococcus aureus (BCID) DETECTED (A) NOT DETECTED Final    Comment: Methicillin (oxacillin)-resistant Staphylococcus aureus (MRSA). MRSA is predictably resistant to beta-lactam antibiotics (except ceftaroline). Preferred therapy is vancomycin unless clinically contraindicated. Patient requires contact precautions if  hospitalized. CRITICAL RESULT CALLED TO, READ BACK BY AND VERIFIED WITH: T ALLEN,RN@0209  11/29/20 [place]    Staphylococcus epidermidis NOT DETECTED NOT DETECTED Final   Staphylococcus lugdunensis NOT DETECTED NOT DETECTED Final   Streptococcus species DETECTED (A) NOT DETECTED Final    Comment: Not Enterococcus species, Streptococcus agalactiae, Streptococcus pyogenes, or Streptococcus pneumoniae. CRITICAL RESULT CALLED TO, READ BACK BY AND VERIFIED WITH: T  ALLEN,RN@ 0209 11/29/20 [place]    Streptococcus agalactiae NOT DETECTED NOT DETECTED Final   Streptococcus pneumoniae NOT DETECTED NOT DETECTED Final   Streptococcus pyogenes NOT DETECTED NOT DETECTED Final   A.calcoaceticus-baumannii NOT DETECTED NOT DETECTED Final   Bacteroides fragilis NOT DETECTED NOT DETECTED Final   Enterobacterales NOT DETECTED NOT DETECTED Final   Enterobacter cloacae complex NOT DETECTED NOT DETECTED Final   Escherichia coli NOT DETECTED NOT DETECTED Final   Klebsiella aerogenes NOT DETECTED NOT DETECTED Final   Klebsiella oxytoca NOT DETECTED NOT DETECTED Final   Klebsiella pneumoniae NOT DETECTED NOT DETECTED Final   Proteus species NOT DETECTED NOT DETECTED Final   Salmonella species NOT DETECTED NOT DETECTED Final   Serratia marcescens NOT DETECTED NOT DETECTED Final   Haemophilus influenzae NOT DETECTED NOT DETECTED Final   Neisseria meningitidis NOT DETECTED NOT DETECTED Final   Pseudomonas aeruginosa NOT DETECTED NOT DETECTED Final   Stenotrophomonas maltophilia NOT DETECTED NOT DETECTED Final   Candida albicans NOT DETECTED NOT DETECTED Final   Candida auris NOT DETECTED NOT DETECTED Final   Candida glabrata NOT DETECTED NOT DETECTED Final   Candida krusei NOT DETECTED NOT DETECTED Final   Candida parapsilosis NOT DETECTED NOT DETECTED Final   Candida tropicalis NOT DETECTED NOT DETECTED Final   Cryptococcus neoformans/gattii NOT DETECTED NOT DETECTED Final   Meth resistant mecA/C and MREJ DETECTED (A) NOT DETECTED Final    Comment: CRITICAL RESULT CALLED TO, READ BACK BY AND VERIFIED WITH: T ALLEN,RN@0209  11/29/20 [place] Performed at [facility], [address], [place], [place] [address]   Culture, blood (Routine X 2) w Reflex to ID Panel     Status: Abnormal   Collection Time: 11/30/20  4:53 AM   Specimen: BLOOD RIGHT HAND  Result Value Ref Range Status   Specimen Description   Final    BLOOD RIGHT HAND Performed at [facility], [address], [place], [place] [address]  Special Requests   Final    BOTTLES DRAWN AEROBIC AND ANAEROBIC Blood Culture adequate volume Performed at [facility], [address], [place], [place] [address]    Culture  Setup Time   Final    GRAM NEGATIVE RODS AEROBIC BOTTLE ONLY Gram Stain Report Called to,Read Back By and Verified With: EVANS T.,2003,11/30/2020,SELF S CRITICAL RESULT CALLED TO, READ BACK BY AND VERIFIED WITH: S,HURTH PHARMD @1216  12/01/20 EB Performed at [facility], [place] [address] [place], [place] [address]    Culture ESCHERICHIA COLI (A)  Final   Report Status 12/02/2020 FINAL  Final   Organism ID, Bacteria ESCHERICHIA COLI  Final      Susceptibility   Escherichia coli - MIC*    AMPICILLIN >=32 RESISTANT Resistant     CEFAZOLIN <=4 SENSITIVE Sensitive     CEFEPIME <=0.12 SENSITIVE Sensitive     CEFTAZIDIME <=1 SENSITIVE Sensitive     CEFTRIAXONE <=0.25 SENSITIVE Sensitive     CIPROFLOXACIN <=0.25 SENSITIVE Sensitive     GENTAMICIN >=16 RESISTANT Resistant     IMIPENEM <=0.25 SENSITIVE Sensitive     TRIMETH/SULFA >=320 RESISTANT Resistant     AMPICILLIN/SULBACTAM >=32 RESISTANT Resistant     PIP/TAZO <=4 SENSITIVE Sensitive     * ESCHERICHIA COLI  Culture, blood (Routine X 2) w Reflex to ID Panel     Status: None (Preliminary result)   Collection Time: 11/30/20  4:53 AM   Specimen: BLOOD RIGHT ARM  Result Value Ref Range Status   Specimen Description BLOOD RIGHT ARM  Final   Special Requests   Final    BOTTLES DRAWN AEROBIC AND ANAEROBIC Blood Culture results may not be optimal due to an excessive volume of blood received in culture bottles   Culture   Final    NO GROWTH 4 DAYS Performed at [facility], [address] [place], [place] [address]    Report Status PENDING  Incomplete  Blood Culture ID Panel (Reflexed)     Status: Abnormal   Collection Time: 11/30/20  4:53 AM  Result Value Ref Range Status   Enterococcus faecalis NOT DETECTED NOT DETECTED  Final   Enterococcus Faecium NOT DETECTED NOT DETECTED Final   Listeria monocytogenes NOT DETECTED NOT DETECTED Final   Staphylococcus species NOT DETECTED NOT DETECTED Final   Staphylococcus aureus (BCID) NOT DETECTED NOT DETECTED Final   Staphylococcus epidermidis NOT DETECTED NOT DETECTED Final   Staphylococcus lugdunensis NOT DETECTED NOT DETECTED Final   Streptococcus species NOT DETECTED NOT DETECTED Final   Streptococcus agalactiae NOT DETECTED NOT DETECTED Final   Streptococcus pneumoniae NOT DETECTED NOT DETECTED Final   Streptococcus pyogenes NOT DETECTED NOT DETECTED Final   A.calcoaceticus-baumannii NOT DETECTED NOT DETECTED Final   Bacteroides fragilis NOT DETECTED NOT DETECTED Final   Enterobacterales DETECTED (A) NOT DETECTED Final    Comment: Enterobacterales represent a large order of gram negative bacteria, not a single organism. CRITICAL RESULT CALLED TO, READ BACK BY AND VERIFIED WITH: S,HURTH PHARMD @1216  12/01/20 [place]    Enterobacter cloacae complex NOT DETECTED NOT DETECTED Final   Escherichia coli DETECTED (A) NOT DETECTED Final    Comment: CRITICAL RESULT CALLED TO, READ BACK BY AND VERIFIED WITH: S,HURTH PHARMD @1216  12/01/20 [place]    Klebsiella aerogenes NOT DETECTED NOT DETECTED Final   Klebsiella oxytoca NOT DETECTED NOT DETECTED Final   Klebsiella pneumoniae NOT DETECTED NOT DETECTED Final   Proteus species NOT DETECTED NOT DETECTED Final   Salmonella species NOT DETECTED NOT  DETECTED Final   Serratia marcescens NOT DETECTED NOT DETECTED Final   Haemophilus influenzae NOT DETECTED NOT DETECTED Final   Neisseria meningitidis NOT DETECTED NOT DETECTED Final   Pseudomonas aeruginosa NOT DETECTED NOT DETECTED Final   Stenotrophomonas maltophilia NOT DETECTED NOT DETECTED Final   Candida albicans NOT DETECTED NOT DETECTED Final   Candida auris NOT DETECTED NOT DETECTED Final   Candida glabrata NOT DETECTED NOT DETECTED Final   Candida krusei NOT DETECTED NOT  DETECTED Final   Candida parapsilosis NOT DETECTED NOT DETECTED Final   Candida tropicalis NOT DETECTED NOT DETECTED Final   Cryptococcus neoformans/gattii NOT DETECTED NOT DETECTED Final   CTX-M ESBL NOT DETECTED NOT DETECTED Final   Carbapenem resistance IMP NOT DETECTED NOT DETECTED Final   Carbapenem resistance KPC NOT DETECTED NOT DETECTED Final   Carbapenem resistance NDM NOT DETECTED NOT DETECTED Final   Carbapenem resist OXA 48 LIKE NOT DETECTED NOT DETECTED Final   Carbapenem resistance VIM NOT DETECTED NOT DETECTED Final    Comment: Performed at [facility], [address], [place], [place] [address]  Culture, blood (Routine X 2) w Reflex to ID Panel     Status: Abnormal   Collection Time: 12/01/20 12:46 PM   Specimen: BLOOD  Result Value Ref Range Status   Specimen Description   Final    BLOOD DRAWN BY DIALYSIS DRAWN BY RN Performed at [facility], [address] [place], [place] [address]    Special Requests   Final    BOTTLES DRAWN AEROBIC AND ANAEROBIC Blood Culture adequate volume Performed at [facility], [address], [place], [place] [address]    Culture  Setup Time   Final    GRAM POSITIVE COCCI both anaerobic and aerobic bottles Gram Stain Report Called to,Read Back By and Verified With: Rhew,J@0545  by Matthews,B 10.22.22 Performed at [facility], [address] [place], [place] [address]    Culture (A)  Final    STAPHYLOCOCCUS AUREUS SUSCEPTIBILITIES PERFORMED ON PREVIOUS CULTURE WITHIN THE LAST 5 DAYS. Performed at [facility], [place] [address], [place], [place] [address]    Report Status 12/04/2020 FINAL  Final  Culture, blood (Routine X 2) w Reflex to ID Panel     Status: Abnormal   Collection Time: 12/01/20 12:55 PM   Specimen: BLOOD  Result Value Ref Range Status   Specimen Description   Final    BLOOD DRAWN BY DIALYSIS DRAWN BY RN Performed at [facility], [address], [place], [place] [address]    Special Requests   Final     BOTTLES DRAWN AEROBIC AND ANAEROBIC Blood Culture adequate volume Performed at [facility], [address] [place], [place] [address]    Culture  Setup Time   Final    GRAM POSITIVE COCCI ANAEROBIC BOTTLE ONLY Gram Stain Report Called to,Read Back By and Verified With: HUMPHRIES @ 1600 ON 102222 BY HENDERSON L CRITICAL VALUE NOTED.  VALUE IS CONSISTENT WITH PREVIOUSLY REPORTED AND CALLED VALUE.    Culture (A)  Final    STAPHYLOCOCCUS AUREUS SUSCEPTIBILITIES PERFORMED ON PREVIOUS CULTURE WITHIN THE LAST 5 DAYS. Performed at [facility], [facility] [address], [place], [place] [address]    Report Status 12/04/2020 FINAL  Final  Culture, blood (routine x 2)     Status: None (Preliminary result)   Collection Time: 12/03/20  1:03 PM   Specimen: BLOOD RIGHT HAND  Result Value Ref Range Status   Specimen Description  Final    BLOOD RIGHT HAND BOTTLES DRAWN AEROBIC AND ANAEROBIC Performed at [facility], [address] [place], [place] [address]    Special Requests   Final    Blood Culture adequate volume Performed at [facility], [address], [place], [place] [address]    Culture  Setup Time   Final    GRAM NEGATIVE RODS aerobic bottle Gram Stain Report Called to,Read Back By and Verified With: [email or web address]  by [email or web address], b 10.24.22 CRITICAL VALUE NOTED.  VALUE IS CONSISTENT WITH PREVIOUSLY REPORTED AND CALLED VALUE. Performed at [facility], [place] [address] [place], [place] [address]    Culture GRAM NEGATIVE RODS  Final   Report Status PENDING  Incomplete  Culture, blood (routine x 2)     Status: None (Preliminary result)   Collection Time: 12/03/20  1:09 PM   Specimen: Right Antecubital; Blood  Result Value Ref Range Status   Specimen Description   Final    RIGHT ANTECUBITAL BOTTLES DRAWN AEROBIC AND ANAEROBIC Performed at [facility], [address], [place], [place] [address]    Special Requests   Final    Blood Culture adequate volume Performed at [facility], [address] [place], [place] [address]    Culture  Setup Time   Final    GRAM POSITIVE COCCI ANAEROBIC BOTTLE ONLY GRAM NEGATIVE RODS AEROBIC BOTTLE ONLY Gram Stain Report Called to,Read Back By and Verified With: BOTH ORGANISMS, MORRIS,C AT 0940 ON 10.24.22 BY [email or web address],B Organism ID to follow Performed at [facility], [place] [address] [place], [place] [address]    Culture PENDING  Incomplete   Report Status PENDING  Incomplete     Scheduled Meds:  calcitRIOL  0.5 mcg Oral Q M,W,F   calcium acetate  1,334 mg Oral TID with meals   Chlorhexidine Gluconate Cloth  6 each Topical Q0600   loratadine  10 mg Oral q1800   mouth rinse  15 mL Mouth Rinse BID   mupirocin ointment   Nasal BID   pantoprazole  40 mg Oral Daily   silver sulfADIAZINE   Topical BID   Continuous Infusions:  ceFEPime (MAXIPIME) IV 2 g (12/04/20 1235)   heparin Stopped (12/04/20 1158)   vancomycin 1,000 mg (12/04/20 1150)    Procedures/Studies: DG Elbow 2 Views Right  Result Date: 11/29/2020 CLINICAL DATA:  Pain and swelling in the right elbow lung recent fall, initial encounter EXAM: RIGHT ELBOW - 2 VIEW COMPARISON:  None. FINDINGS: Mild degenerative changes of the right elbow joint are seen. No acute fracture or dislocation is noted. Small joint effusion is noted anteriorly. Olecranon spurring is seen. IMPRESSION: Small joint effusion without acute fracture. Electronically Signed   By: Inez Catalina M.D.   On: 11/29/2020 18:58   MR FOOT RIGHT WO CONTRAST  Result Date: 12/01/2020 CLINICAL DATA:  Right foot ulcer. EXAM: MRI OF THE RIGHT FOREFOOT WITHOUT CONTRAST TECHNIQUE: Multiplanar, multisequence MR imaging of the right forefoot was performed. No intravenous contrast was administered. COMPARISON:  Right foot x-rays dated November 27, 2020. FINDINGS: Bones/Joint/Cartilage Faint marrow edema the fifth metatarsal head. Remaining marrow signal is normal. No fracture or dislocation. Mild second TMT joint  osteoarthritis. No joint effusion. Ligaments Collateral ligaments are intact.  Lisfranc ligament is intact. Muscles and Tendons Flexor and extensor tendons are intact. Increased T2 signal within the intrinsic muscles of the forefoot, nonspecific, but likely related to diabetic muscle changes. Soft tissue Diffuse soft tissue swelling. Soft tissue ulceration along  the lateral forefoot near the fifth metatarsal head with underlying 2.1 x 0.4 x 2.3 cm curvilinear fluid collection (series 8, image 26). No soft tissue mass. IMPRESSION: 1. Soft tissue ulceration along the lateral forefoot near the fifth metatarsal head with underlying 2.3 cm curvilinear fluid collection, concerning for abscess. 2. Faint marrow edema in the fifth metatarsal head is nonspecific, but concerning for early osteomyelitis given adjacent abscess and soft tissue ulcer. Electronically Signed   By: Titus Dubin M.D.   On: 12/01/2020 14:16   DG Chest Port 1 View  Result Date: 11/29/2020 CLINICAL DATA:  Removal of Port-A-Cath EXAM: PORTABLE CHEST 1 VIEW COMPARISON:  11/27/2020 FINDINGS: There is been interval removal of hemodialysis catheter on the right. No visible retained fragments. Prior CABG. Cardiomegaly. Vascular congestion. Bilateral layering effusions and lower lobe airspace opacity. Probable mild edema/CHF. IMPRESSION: Interval removal of right hemodialysis catheter. CHF.  Layering effusions. Electronically Signed   By: Rolm Baptise M.D.   On: 11/29/2020 16:58   DG Chest Port 1 View  Result Date: 11/27/2020 CLINICAL DATA:  Fall.  Chest and right foot pain. EXAM: PORTABLE CHEST 1 VIEW COMPARISON:  09/01/2020 FINDINGS: Unchanged mild cardiomegaly. Unchanged mild pulmonary vascular congestion. Small bilateral pleural effusions are not significantly changed since prior examination. Interval placement of dialysis catheter. Median sternotomy changes again noted. IMPRESSION: No acute abnormality of the chest. Unchanged findings of mild  CHF/fluid volume overload. Electronically Signed   By: Miachel Roux M.D.   On: 11/27/2020 11:24   DG Foot 2 Views Right  Result Date: 11/27/2020 CLINICAL DATA:  wound to lateral fore foot EXAM: RIGHT FOOT - 2 VIEW COMPARISON:  Radiograph 08/18/2020 FINDINGS: There is no evidence of acute fracture or dislocation. Unchanged bulky ossification along the base of the fifth metatarsal, possibly from prior trauma. There is a lateral forefoot soft tissue defect. There is no frank bony destruction radiographically. Mild midfoot and forefoot degenerative changes. Plantar and dorsal calcaneal spurring. Vascular calcifications. IMPRESSION: Lateral forefoot soft tissue defect. No frank bony destruction radiographically to suggest osteomyelitis. MRI would be more sensitive. Electronically Signed   By: Maurine Simmering M.D.   On: 11/27/2020 11:32   ECHOCARDIOGRAM LIMITED  Result Date: 11/28/2020    ECHOCARDIOGRAM LIMITED REPORT   Patient Name:   MONTAVIS SCHUBRING [facility] Date of Exam: 11/28/2020 Medical Rec #:  [id number]           Height:       74.0 in Accession #:    [id number]          Weight:       261.9 lb Date of Birth:  December 13, 1950           BSA:          2.437 m Patient Age:    43 years            BP:           124/56 mmHg Patient Gender: M                   HR:           72 bpm. Exam Location:  [facility] Procedure: Limited Echo, Color Doppler and Cardiac Doppler Indications:    Dyspnea R06.00  History:        Patient has prior history of Echocardiogram examinations, most                 recent 06/06/2020. CAD, Prior CABG, Arrythmias:Atrial  Fibrillation, Signs/Symptoms:Shortness of Breath; Risk                 Factors:Hypertension, Diabetes and Dyslipidemia.  Sonographer:    Alvino Chapel RCS Referring Phys: [id number] [place]  1. Left ventricular ejection fraction, by estimation, is 45 to 50%. The left ventricle has mildly decreased function. The left ventricle demonstrates regional  wall motion abnormalities (see scoring diagram/findings for description). There is severe asymmetric left ventricular hypertrophy of the basal and septal segments. Left ventricular diastolic parameters are indeterminate. There is the interventricular septum is flattened in systole and diastole, consistent with right ventricular pressure and volume overload.  2. Right ventricular systolic function is low normal. The right ventricular size is mildly enlarged. There is moderately elevated pulmonary artery systolic pressure. The estimated right ventricular systolic pressure is 85.4 mmHg.  3. Left atrial size was mildly dilated.  4. Right atrial size was mildly dilated.  5. Left pleural effusion evident. A small pericardial effusion is present. The pericardial effusion is posterior to the left ventricle.  6. The mitral valve is abnormal. Mild mitral valve regurgitation.  7. The aortic valve is tricuspid. There is moderate calcification of the aortic valve. Aortic valve regurgitation is not visualized.  8. The inferior vena cava is dilated in size with <50% respiratory variability, suggesting right atrial pressure of 15 mmHg. Comparison(s): Prior images reviewed side by side. Reduced LVEF and wall motion abnormalities are not new based on my review of the prior study. FINDINGS  Left Ventricle: Left ventricular ejection fraction, by estimation, is 45 to 50%. The left ventricle has mildly decreased function. The left ventricle demonstrates regional wall motion abnormalities. Definity contrast agent was given IV to delineate the left ventricular endocardial borders. There is severe asymmetric left ventricular hypertrophy of the basal and septal segments. The interventricular septum is flattened in systole and diastole, consistent with right ventricular pressure and volume overload. Left ventricular diastolic parameters are indeterminate. Left ventricular diastolic function could not be evaluated due to atrial fibrillation.   LV Wall Scoring: The basal inferior segment is akinetic. The posterior wall, mid inferoseptal segment, mid inferior segment, and basal inferoseptal segment are hypokinetic. The entire anterior wall, antero-lateral wall, entire anterior septum, and entire apex are normal. Right Ventricle: The right ventricular size is mildly enlarged. No increase in right ventricular wall thickness. Right ventricular systolic function is low normal. There is moderately elevated pulmonary artery systolic pressure. The tricuspid regurgitant  velocity is 3.12 m/s, and with an assumed right atrial pressure of 15 mmHg, the estimated right ventricular systolic pressure is 62.7 mmHg. Left Atrium: Left atrial size was mildly dilated. Right Atrium: Right atrial size was mildly dilated. Pericardium: Left pleural effusion evident. A small pericardial effusion is present. The pericardial effusion is posterior to the left ventricle. Mitral Valve: The mitral valve is abnormal. There is mild thickening of the mitral valve leaflet(s). There is mild calcification of the mitral valve leaflet(s). Mild mitral valve regurgitation. Tricuspid Valve: The tricuspid valve is grossly normal. Tricuspid valve regurgitation is mild. Aortic Valve: The aortic valve is tricuspid. There is moderate calcification of the aortic valve. There is mild to moderate aortic valve annular calcification. Aortic valve regurgitation is not visualized. Aorta: The aortic root is normal in size and structure. Venous: The inferior vena cava is dilated in size with less than 50% respiratory variability, suggesting right atrial pressure of 15 mmHg. IAS/Shunts: No atrial level shunt detected by color flow Doppler. LEFT VENTRICLE PLAX 2D  LVIDd:         5.50 cm LVIDs:         4.30 cm LV PW:         1.30 cm LV IVS:        1.80 cm LVOT diam:     2.30 cm LVOT Area:     4.15 cm  RIGHT VENTRICLE TAPSE (M-mode): 1.7 cm LEFT ATRIUM              Index        RIGHT ATRIUM           Index LA diam:         4.20 cm  1.72 cm/m   RA Area:     27.30 cm LA Vol (A2C):   102.0 ml 41.85 ml/m  RA Volume:   96.50 ml  39.59 ml/m LA Vol (A4C):   74.5 ml  30.57 ml/m LA Biplane Vol: 91.8 ml  37.66 ml/m   AORTA Ao Root diam: 3.80 cm MITRAL VALVE                TRICUSPID VALVE MV Area (PHT): 5.02 cm     TR Peak grad:   38.9 mmHg MV Decel Time: 151 msec     TR Vmax:        312.00 cm/s MV E velocity: 143.00 cm/s                             SHUNTS                             Systemic Diam: 2.30 cm Rozann Lesches MD Electronically signed by Rozann Lesches MD Signature Date/Time: 11/28/2020/1:03:27 PM    Final     Orson Eva, DO  [facility]  If 7PM-7AM, please contact night-coverage [email or web address] Password [email or web address] 12/04/2020, 3:59 PM   LOS: 7 days

## 2020-12-04 NOTE — Progress Notes (Signed)
[facility]  GNR on the Blood culture yesterday.  Wound looked at again.  Lateral ulcer had sealed it self back up and had a spot of purulent drainage with expression, with verbal consent prepped with betadine and cut out the tissue to allow for better drainage and silvadene application to the lateral aspect. Erythema about the same just around the wound.  Will change to BID silvadene.  Getting TEE today.   Pre- Debridement- lateral ulcer sealed and drainage expressed    Post debridement- tissue cleared off and cavity tracks 1cm toward top of foot       Curlene Labrum, MD [facility] [place], [place] [id number] [phone] (office)

## 2020-12-04 NOTE — Progress Notes (Addendum)
    Subjective: Denies solid food or liquid dysphagia. No abdominal pain. No N/V. Wants to eat. NPO for procedure today. Afebrile. No overt GI bleeding.   Objective: Vital signs in last 24 hours: Temp:  [97.5 F (36.4 C)-98 F (36.7 C)] 98 F (36.7 C) (10/24 0417) Pulse Rate:  [74-79] 79 (10/24 0417) Resp:  [17-19] 19 (10/24 0417) BP: (114-136)/(46-68) 130/63 (10/24 0417) SpO2:  [93 %-95 %] 95 % (10/24 0417) Weight:  [114.6 kg] 114.6 kg (10/24 0600) Last BM Date: 12/02/20 General:   Alert and oriented, pleasant Abdomen:  Bowel sounds present, soft, non-tender, non-distended. No rebound or guarding. No masses appreciated  Neurologic:  Alert and  oriented x4 Psych:  Normal mood and affect.  Intake/Output from previous day: 10/23 0701 - 10/24 0700 In: 1733.4 [P.O.:1360; I.V.:373.4] Out: -  Intake/Output this shift: No intake/output data recorded.  Lab Results: Recent Labs    12/02/20 0435 12/03/20 0447 12/04/20 0341  WBC 11.3* 12.0* 13.9*  HGB 9.8* 9.4* 9.7*  HCT 32.9* 31.7* 31.5*  PLT 178 207 214   BMET Recent Labs    12/04/20 0341  NA 129*  K 4.2  CL 91*  CO2 23  GLUCOSE 283*  BUN 97*  CREATININE 7.55*  CALCIUM 8.1*   LFT Recent Labs    12/02/20 0435 12/04/20 0341  PROT 6.9 6.0*  ALBUMIN 3.1* 2.6*  AST 347* 138*  ALT 130* 180*  ALKPHOS 174* 194*  BILITOT 1.6* 0.8  BILIDIR 0.5*  --   IBILI 1.1*  --     Assessment: 70 year old male with multiple comorbidities and admitted with sepsis in setting of MRSA bacteremia, acute respiratory failure due to pulmonary edema, acute on chronic HF, ESRD on dialysis, chronic afib and on IV heparin while inpatient, with GI consulted due to dysphagia. EGD during admission with no abnormality to explain dysphagia and was empirically dilated. Portal hypertensive gastropathy also seen.  Dysphagia has improved. Tolerating diet but NPO for TEE today. Likely underlying motility disorder.  Elevated LFTs: normal LFTs in  July 2022. Now with acutely elevated transaminases and alk phos. Acute hepatitis panel negative in July 2022. Suspected due to hepatic congestion and acute illness. Portal gastropathy noted on EGD. [place] ordered for today.   Anemia: without overt GI bleeding. Remaining stable. Multifactorial in setting of IDA and chronic disease. Last ferritin 17 in July 2022. Will need follow-up as outpatient.   Plan: US abdomen complete Follow HFP Consider BPE if further dysphagia as outpatient. For now follow clinically PPI daily Outpatient follow-up for IDA   Travis Needs, PhD, ANP-BC [facility]    LOS: 7 days    12/04/2020, 8:41 AM

## 2020-12-05 ENCOUNTER — Ambulatory Visit: Admit: 2020-12-05 | Payer: PPO | Admitting: Cardiology

## 2020-12-05 ENCOUNTER — Inpatient Hospital Stay (HOSPITAL_COMMUNITY): Payer: PPO | Admitting: Anesthesiology

## 2020-12-05 ENCOUNTER — Inpatient Hospital Stay (HOSPITAL_COMMUNITY): Payer: PPO

## 2020-12-05 ENCOUNTER — Ambulatory Visit: Payer: PPO | Admitting: Podiatry

## 2020-12-05 ENCOUNTER — Encounter (HOSPITAL_COMMUNITY)
Admission: EM | Disposition: A | Discharge status: Skilled Nursing Facility | Payer: Self-pay | Source: Home / Self Care | Attending: Internal Medicine

## 2020-12-05 ENCOUNTER — Encounter (HOSPITAL_COMMUNITY): Payer: Self-pay | Admitting: Internal Medicine

## 2020-12-05 DIAGNOSIS — D649 Anemia, unspecified: Secondary | ICD-10-CM

## 2020-12-05 DIAGNOSIS — R7989 Other specified abnormal findings of blood chemistry: Secondary | ICD-10-CM | POA: Diagnosis not present

## 2020-12-05 DIAGNOSIS — R7881 Bacteremia: Secondary | ICD-10-CM

## 2020-12-05 DIAGNOSIS — R1319 Other dysphagia: Secondary | ICD-10-CM | POA: Diagnosis not present

## 2020-12-05 DIAGNOSIS — I340 Nonrheumatic mitral (valve) insufficiency: Secondary | ICD-10-CM

## 2020-12-05 DIAGNOSIS — I361 Nonrheumatic tricuspid (valve) insufficiency: Secondary | ICD-10-CM

## 2020-12-05 HISTORY — PX: TEE WITHOUT CARDIOVERSION: SHX5443

## 2020-12-05 LAB — CULTURE, BLOOD (ROUTINE X 2)
Culture: NO GROWTH
Special Requests: ADEQUATE

## 2020-12-05 LAB — CBC
HCT: 34 % — ABNORMAL LOW (ref 39.0–52.0)
Hemoglobin: 10.5 g/dL — ABNORMAL LOW (ref 13.0–17.0)
MCH: 27.6 pg (ref 26.0–34.0)
MCHC: 30.9 g/dL (ref 30.0–36.0)
MCV: 89.5 fL (ref 80.0–100.0)
Platelets: 226 10*3/uL (ref 150–400)
RBC: 3.8 MIL/uL — ABNORMAL LOW (ref 4.22–5.81)
RDW: 18.3 % — ABNORMAL HIGH (ref 11.5–15.5)
WBC: 16 10*3/uL — ABNORMAL HIGH (ref 4.0–10.5)
nRBC: 0 % (ref 0.0–0.2)

## 2020-12-05 LAB — COMPREHENSIVE METABOLIC PANEL
ALT: 141 U/L — ABNORMAL HIGH (ref 0–44)
AST: 88 U/L — ABNORMAL HIGH (ref 15–41)
Albumin: 2.6 g/dL — ABNORMAL LOW (ref 3.5–5.0)
Alkaline Phosphatase: 184 U/L — ABNORMAL HIGH (ref 38–126)
Anion gap: 11 (ref 5–15)
BUN: 71 mg/dL — ABNORMAL HIGH (ref 8–23)
CO2: 26 mmol/L (ref 22–32)
Calcium: 8 mg/dL — ABNORMAL LOW (ref 8.9–10.3)
Chloride: 92 mmol/L — ABNORMAL LOW (ref 98–111)
Creatinine, Ser: 6.28 mg/dL — ABNORMAL HIGH (ref 0.61–1.24)
GFR, Estimated: 9 mL/min — ABNORMAL LOW (ref >60)
Glucose, Bld: 237 mg/dL — ABNORMAL HIGH (ref 70–99)
Potassium: 4 mmol/L (ref 3.5–5.1)
Sodium: 129 mmol/L — ABNORMAL LOW (ref 135–145)
Total Bilirubin: 0.8 mg/dL (ref 0.3–1.2)
Total Protein: 6.5 g/dL (ref 6.5–8.1)

## 2020-12-05 LAB — APTT
aPTT: 44 seconds — ABNORMAL HIGH (ref 24–36)
aPTT: 50 seconds — ABNORMAL HIGH (ref 24–36)
aPTT: 73 seconds — ABNORMAL HIGH (ref 24–36)

## 2020-12-05 LAB — HEPARIN LEVEL (UNFRACTIONATED): Heparin Unfractionated: 0.51 IU/mL (ref 0.30–0.70)

## 2020-12-05 SURGERY — INSERTION OF DIALYSIS CATHETER
Anesthesia: Choice | Laterality: Right

## 2020-12-05 SURGERY — ECHOCARDIOGRAM, TRANSESOPHAGEAL
Anesthesia: General

## 2020-12-05 MED ORDER — SODIUM CHLORIDE 0.9 % IV SOLN: INTRAVENOUS | Status: DC

## 2020-12-05 MED ORDER — SODIUM CHLORIDE BACTERIOSTATIC 0.9 % IJ SOLN
INTRAMUSCULAR | Status: AC
  Filled 2020-12-05: qty 10

## 2020-12-05 MED ORDER — ETOMIDATE 2 MG/ML IV SOLN
INTRAVENOUS | Status: AC
  Filled 2020-12-05: qty 10

## 2020-12-05 MED ORDER — CEFAZOLIN SODIUM-DEXTROSE 2-4 GM/100ML-% IV SOLN
2.0000 g | INTRAVENOUS | Status: DC
  Administered 2020-12-06 – 2020-12-08 (×2): 2 g via INTRAVENOUS
  Filled 2020-12-05 (×2): qty 100

## 2020-12-05 MED ORDER — ETOMIDATE 2 MG/ML IV SOLN
INTRAVENOUS | Status: DC | PRN
  Administered 2020-12-05: 8 mg via INTRAVENOUS

## 2020-12-05 MED ORDER — PHENYLEPHRINE HCL (PRESSORS) 10 MG/ML IV SOLN
INTRAVENOUS | Status: DC | PRN
  Administered 2020-12-05 (×3): 80 ug via INTRAVENOUS

## 2020-12-05 MED ORDER — PHENYLEPHRINE 40 MCG/ML (10ML) SYRINGE FOR IV PUSH (FOR BLOOD PRESSURE SUPPORT)
PREFILLED_SYRINGE | INTRAVENOUS | Status: AC
  Filled 2020-12-05: qty 10

## 2020-12-05 MED ORDER — BUTAMBEN-TETRACAINE-BENZOCAINE 2-2-14 % EX AERO
INHALATION_SPRAY | CUTANEOUS | Status: AC
  Filled 2020-12-05: qty 5

## 2020-12-05 MED ORDER — PROPOFOL 10 MG/ML IV BOLUS
INTRAVENOUS | Status: DC | PRN
  Administered 2020-12-05: 20 mg via INTRAVENOUS
  Administered 2020-12-05: 40 mg via INTRAVENOUS

## 2020-12-05 NOTE — Transfer of Care (Signed)
Immediate Anesthesia Transfer of Care Note  Patient: Travis Blair  Procedure(s) Performed: TRANSESOPHAGEAL ECHOCARDIOGRAM (TEE)  Patient Location: PACU  Anesthesia Type:General  Level of Consciousness: drowsy  Airway & Oxygen Therapy: Patient Spontanous Breathing and Patient connected to nasal cannula oxygen  Post-op Assessment: Report given to RN and Post -op Vital signs reviewed and stable  Post vital signs: Reviewed and stable  Last Vitals:  Vitals Value Taken Time  BP 101/50   Temp    Pulse 70   Resp 24   SpO2 95%     Last Pain:  Vitals:   12/05/20 1353  TempSrc:   PainSc: 0-No pain         Complications: No notable events documented.

## 2020-12-05 NOTE — Progress Notes (Addendum)
ANTICOAGULATION CONSULT NOTE  Pharmacy Consult for Heparin Indication: hx DVT/afib  Allergies  Allergen Reactions   Latex Hives and Rash   Lipitor [Atorvastatin]     Leg aches     Patient Measurements: Height: 6\' 2"  (188 cm) Weight: 114.6 kg (252 lb 10.4 oz) IBW/kg (Calculated) : 82.2 HEPARIN DW (KG): 107.6   Vital Signs: Temp: 97.5 F (36.4 C) (10/25 1256) Temp Source: Oral (10/25 1256) BP: 135/76 (10/25 1256) Pulse Rate: 82 (10/25 1256)  Labs: Recent Labs    12/03/20 0447 12/03/20 1039 12/03/20 1904 12/04/20 0341 12/04/20 0947 12/04/20 1411 12/05/20 0019 12/05/20 0757  HGB 9.4*  --   --  9.7*  --   --   --  10.5*  HCT 31.7*  --   --  31.5*  --   --   --  34.0*  PLT 207  --   --  214  --   --   --  226  APTT 45* 40*   < >  --  48* 30 44* 73*  HEPARINUNFRC >1.10* 0.99*  --   --  0.57  --   --  0.51  CREATININE  --   --   --  7.55*  --   --   --  6.28*   < > = values in this interval not displayed.     Estimated Creatinine Clearance: 14.9 mL/min (A) (by C-G formula based on SCr of 6.28 mg/dL (H)).   Assessment: 70 y.o. male with h/o DVT and Afib, Eliquis on hold, for heparin. APTT 77, therapeutic, no issues with infusion. Planning for TEE today. HL 0.51, therapeutic. Likely correlating, f/u with AM labs Goal of Therapy:  APTT 66-102 sec Heparin level 0.3-0.7 units/ml Monitor platelets by anticoagulation protocol: Yes   Plan:  Continue Heparin infusion at 2650 units/hr aPTT in ~8 hours and daily while on heparin. Daily HL until correlating Continue to monitor H&H and platelets  Isac Sarna, BS [place] [place], BCPS Clinical Pharmacist Pager [phone]

## 2020-12-05 NOTE — Progress Notes (Signed)
Subjective: Reports he is feeling somewhat better today.  Denies abdominal pain, nausea, vomiting, brbpr or melena.  Reports esophageal dilation earlier this admission was helpful, but he hasn't had anything to eat in 2 days. NPO due to planned TEE. Initially TEE planned yesterday, now planned for 2:30 this afternoon.   Objective: Vital signs in last 24 hours: Temp:  [97.6 F (36.4 C)-98.5 F (36.9 C)] 98.5 F (36.9 C) (10/25 0551) Pulse Rate:  [60-86] 82 (10/25 0551) Resp:  [16-19] 19 (10/25 0551) BP: (104-140)/(54-72) 124/68 (10/25 0551) SpO2:  [93 %-100 %] 100 % (10/25 0551) Last BM Date:  (patient stated it has been a long time) General:   Alert and oriented, pleasant, NAD. Head:  Normocephalic and atraumatic. Eyes:  No icterus, sclera clear. Conjuctiva pink.  Abdomen:  Bowel sounds present, soft, non-tender, non-distended. No HSM or hernias noted. No rebound or guarding. No masses appreciated  Msk:  Symmetrical without gross deformities. Normal posture. Extremities:  With 1+ bilateral LE pitting edema, 2+ pitting edema in left foot. Right foot with bandage in place.  Neurologic:  Alert and  oriented x4;  grossly normal neurologically. Skin:  Warm and dry, intact without significant lesions.  Psych:  Normal mood and affect.  Intake/Output from previous day: 10/24 0701 - 10/25 0700 In: 120 [P.O.:120] Out: 3050  Intake/Output this shift: No intake/output data recorded.  Lab Results: Recent Labs    12/03/20 0447 12/04/20 0341 12/05/20 0757  WBC 12.0* 13.9* 16.0*  HGB 9.4* 9.7* 10.5*  HCT 31.7* 31.5* 34.0*  PLT 207 214 226   BMET Recent Labs    12/04/20 0341 12/05/20 0757  NA 129* 129*  K 4.2 4.0  CL 91* 92*  CO2 23 26  GLUCOSE 283* 237*  BUN 97* 71*  CREATININE 7.55* 6.28*  CALCIUM 8.1* 8.0*   LFT Recent Labs    12/04/20 0341 12/05/20 0757  PROT 6.0* 6.5  ALBUMIN 2.6* 2.6*  AST 138* 88*  ALT 180* 141*  ALKPHOS 194* 184*  BILITOT 0.8 0.8     Studies/Results: US Abdomen Complete  Result Date: 12/04/2020 CLINICAL DATA:  Elevated LFT EXAM: ABDOMEN ULTRASOUND COMPLETE COMPARISON:  Renal ultrasound 04/18/2017 FINDINGS: Gallbladder: No shadowing stones. Small fluid around the gallbladder. Negative sonographic Murphy. Increased wall thickness of 5.2 mm. Common bile duct: Diameter: 4.8 mm Liver: No focal lesion identified. Within normal limits in parenchymal echogenicity. Portal vein is patent on color Doppler imaging with normal direction of blood flow towards the liver. IVC: No abnormality visualized. Pancreas: Obscured by bowel gas Spleen: Slightly enlarged with volume of 526 mL. Right Kidney: Length: 12.3 cm.  Lower pole cyst measuring 2.2 cm. Left Kidney: Length: 13.3 cm. Echogenicity within normal limits. No mass or hydronephrosis visualized. Abdominal aorta: No aneurysm visualized. Other findings: Incidental bilateral pleural effusions. Small amount of ascites within the abdomen. IMPRESSION: 1. Negative for gallstones. Slight increased gallbladder wall thickness but negative sonographic Percell Miller, findings are nonspecific. There is a small amount of fluid adjacent to the gallbladder likely related to generalized small ascites in the right upper quadrant. 2. Liver grossly within normal limits. 3. Mild splenomegaly 4. Small amount of ascites within the upper and lower abdomen. 5. Bilateral pleural effusions Electronically Signed   By: Donavan Foil M.D.   On: 12/04/2020 16:00    Assessment: 70 year old male with multiple comorbidities and admitted with sepsis in setting of MRSA/E. coli bacteremia, acute respiratory failure due to pulmonary edema, acute on chronic HF, ESRD  on dialysis, chronic afib and on IV heparin while inpatient, with GI consulted due to dysphagia. EGD during admission with no abnormality to explain dysphagia and was empirically dilated. Portal hypertensive gastropathy also seen.  Dysphagia: Patient reports improvement with  empiric esophageal dilation.  Currently n.p.o. for TEE today.  Query underlying motility disorder in light of normal-appearing esophagus on EGD.  Monitor for return of symptoms. Can consider BPE if needed outpatient.  Elevated LFTs: Previously, normal LFTs in July 2022.  Acutely elevated LFTs this admission, improving.  AST 88, ALT 141, alk phos 184, T bili 0.8 today.  This is down from AST 347, ALT 130, alk phos 174, T bili 1.6 on admission.  Acute hepatitis panel negative July 2022. [place] yesterday due to portal gastropathy noted on EGD with normal appearing liver, patent portal vein.  He does have mild splenomegaly, platelets normal.  Suspect acute elevation is secondary to hepatic congestion and sepsis. No need for further evaluation at this time. Continue to trend. Anticipate normalization once he recovers from acute illness.   Anemia: Without overt GI bleeding. Remaining stable. Multifactorial in setting of IDA and chronic disease. Last ferritin 17 in July 2022. Will need follow-up as outpatient.   Plan: Follow LFTs daily. Continue PPI daily. Monitor for return of dysphagia.  Consider BPE outpatient if needed.  Outpatient follow-up if IDA and dysphagia with [facility].  GI will sign off.    LOS: 8 days    12/05/2020, 9:33 AM   Aliene Altes, PA-C [facility]

## 2020-12-05 NOTE — H&P (Signed)
Primary team has requested TEE in setting of MRSA and Ecoli bacteremia. Will plan for TEE today with the assistance of anesthesiology. For full history please refer to referenced H&P below.  Travis Dolly MD   History and Physical      Travis Blair TKP:[id number] DOB: 11-26-1950 DOA: 11/27/2020   PCP: Travis Frizzle, MD    Patient coming from: home    Chief Complaint: dyspnea and cough    HPI: Travis Blair is a 70 y.o. male with medical history significant of CHF, ERSD on HD (M-W-F), CAD, DVT/PE, and T2DM, CABG with post operative atrial fibrillation, pulmonary hypertension who presented with dyspnea.  Patient reported having cough for about 2 weeks, persistent and worsening.  Tried several antitussive agents at home without improvement of symptoms.  He went to dialysis on Friday, 3 days ago, yesterday he was noted to be dyspneic by his wife, moderate in intensity, associated with cough but no chest pain. Last night he was noted to have poor balance, and he declined to be brought to the emergency department. This morning he fell while in the bathroom, landed between the commode and the wall, EMS was called and patient was brought to the hospital.   His main symptom seems to be increased work of breathing/dyspnea, moderate to severe in intensity, worse with exertion, associated with cough, no chest pain, no improving factors. Last night not able to sleep due to persistent cough, dry.  He continues to make urine 3 times daily.   In June 2022 he decided to stop apixaban due to weakness, he was placed on clopidogrel. Last year he had COVID-pneumonia and pulm embolism. About 3 months ago he was started on hemodialysis, he has a fistula which is not mature yet.  He uses a tunneled dialysis catheter for dialysis.       ED Course: Patient was found in respiratory distress, and placed on supplemental oxygen per nasal cannula.  Work-up showed elevated troponins and high D-dimer,  he was placed on heparin drip. Nephrology has been contacted for hemodialysis.   Review of Systems:  1. General: No fevers, no chills, generalized weakness. 2. ENT: No runny nose or sore throat, no hearing disturbances 3. Pulmonary: Positive for cough, dyspnea as mentioned in history of present illness  4. Cardiovascular: No angina, claudication, positive lower extremity edema, but no pnd or orthopnea 5. Gastrointestinal: No nausea or vomiting, no diarrhea or constipation 6. Hematology: No easy bruisability or frequent infections 7. Urology: No dysuria, hematuria or increased urinary frequency 8. Dermatology: Right foot ulcerated wound.. 9. Neurology: No seizures or paresthesias 10. Musculoskeletal: No joint pain or deformities       Past Medical History:  Diagnosis Date   Anemia associated with chronic renal failure basically   Asthma      as a child   CHF (congestive heart failure) (HCC)     Chronic kidney disease      Sees Dr [name]   Constipation     Coronary artery disease     COVID-19     Depression     Diabetes mellitus without complication ([place])     DVT (deep venous thrombosis) ([place])     History of blood clots     Hyperlipidemia     Hypertension     Leg pain      ABIs/LE Arterial US 03/2019: ABIs unreliable; +calcification, no evidence of stenosis   Myocardial infarction [facility])     Obesity  Pneumonia 2018   Pulmonary embolism (HCC)     Sleep apnea      CPAP   SOB (shortness of breath)     Swelling of both lower extremities             Past Surgical History:  Procedure Laterality Date   A/V FISTULAGRAM Left 09/29/2020    Procedure: A/V FISTULAGRAM;  Surgeon: Angelia Mould, MD;  Location: [facility] [facility];  Service: Cardiovascular;  Laterality: Left;   AV FISTULA PLACEMENT Left 05/04/2020    Procedure: LEFT ARM ARTERIOVENOUS (AV) FISTULA CREATION;  Surgeon: Rosetta Posner, MD;  Location: [facility];  Service: Vascular;  Laterality: Left;   COLONOSCOPY        CORONARY ARTERY BYPASS GRAFT N/A 03/07/2017    Procedure: CORONARY ARTERY BYPASS GRAFTING (CABG), X 3 , USING RIGHT INTERNAL MAMMARY ARTERY, AND RIGHT LEG GREATER SAPHENOUS VEIN HARVESTED ENDOSCOPICALLY;  Surgeon: [name], MD;  Location: [place];  Service: Open Heart Surgery;  Laterality: N/A;   FISTULA SUPERFICIALIZATION Left 10/09/2020    Procedure: FISTULA SUPERFICIALIZATION;  Surgeon: Angelia Mould, MD;  [facility]: [facility];  Service: Vascular;  Laterality: Left;   HAND SURGERY Right 1991   IR FLUORO GUIDE CV LINE RIGHT   09/04/2020   IR US GUIDE VASC ACCESS RIGHT   09/04/2020   LIGATION OF COMPETING BRANCHES OF ARTERIOVENOUS FISTULA Left 10/09/2020    Procedure: LIGATION OF 2 LARGE COMPETING BRANCHES OF LEFT BRACHIOCEPHALIC FISTULA;  Surgeon: Angelia Mould, MD;  Location: [place];  Service: Vascular;  Laterality: Left;   RIGHT HEART CATH N/A 07/07/2020    Procedure: RIGHT HEART CATH;  Surgeon: Jolaine Artist, MD;  Location: [facility];  Service: Cardiovascular;  Laterality: N/A;   RIGHT/LEFT HEART CATH AND CORONARY ANGIOGRAPHY N/A 12/20/2016    Procedure: RIGHT/LEFT HEART CATH AND CORONARY ANGIOGRAPHY;  Surgeon: [name], MD;  Location: [place] [facility];  Service: Cardiovascular;  Laterality: N/A;   TEE WITHOUT CARDIOVERSION N/A 03/07/2017    Procedure: TRANSESOPHAGEAL ECHOCARDIOGRAM (TEE);  Surgeon: [name], [name], MD;  Location: [place];  Service: Open Heart Surgery;  Laterality: N/A;   TONSILLECTOMY           reports that he has never smoked. He has never used smokeless tobacco. He reports that he does not drink alcohol and does not use drugs.        Allergies  Allergen Reactions   Latex Hives and Rash   Lipitor [Atorvastatin]        Leg aches             Family History  Problem Relation Age of Onset   Heart disease Mother     Diabetes Mother     Multiple myeloma Mother     Cancer Father          type unknown, as a child   Heart  disease Father     Hypertension Father     Congenital heart disease Father     Hyperlipidemia Father     Lymphoma Sister                 Prior to Admission medications   Medication Sig Start Date End Date Taking? Authorizing Provider  albuterol (VENTOLIN HFA) 108 (90 Base) MCG/ACT inhaler Inhale 1-2 puffs into the lungs every 6 (six) hours as needed for wheezing or shortness of breath. 07/04/20   Yes Pickard, Cammie Mcgee, MD  calcium  acetate (PHOSLO) 667 MG capsule Take 1,334 mg by mouth 3 (three) times daily. 09/19/20   Yes [provider]  clopidogrel (PLAVIX) 75 MG tablet Take 1 tablet (75 mg total) by mouth daily. 11/09/20   Yes Nahser, Wonda Cheng, MD  ferrous sulfate (FERROUSUL) 325 (65 FE) MG tablet Take 1 tablet (325 mg total) by mouth daily with breakfast. 01/28/20   Yes Travis Frizzle, MD  fluticasone (FLONASE) 50 MCG/ACT nasal spray Place 2 sprays into both nostrils daily. Patient taking differently: Place 2 sprays into both nostrils daily as needed for allergies. 05/01/20   Yes Travis Frizzle, MD  levocetirizine (XYZAL) 5 MG tablet Take 5 mg by mouth every evening.     Yes [provider]  pantoprazole (PROTONIX) 40 MG tablet Take 1 tablet (40 mg total) by mouth daily. 08/18/20   Yes Travis Frizzle, MD  Evolocumab (REPATHA SURECLICK) 397 MG/ML SOAJ Inject 1 pen into the skin every 14 (fourteen) days. Patient not taking: Reported on 11/27/2020 06/23/20     Nahser, Wonda Cheng, MD  fluorouracil (EFUDEX) 5 % cream Apply topically 2 (two) times daily. Patient not taking: Reported on 11/27/2020 10/11/20     [provider]      Physical Exam:       Vitals:    11/27/20 1130 11/27/20 1145 11/27/20 1200 11/27/20 1300  BP:       116/61  Pulse: 80 75 82 73  Resp:       20  Temp:          TempSrc:          SpO2: 99% 98% 96% 96%  Weight:          Height:                    Vitals:    11/27/20 1130 11/27/20 1145 11/27/20 1200 11/27/20 1300  BP:       116/61   Pulse: 80 75 82 73  Resp:       20  Temp:          TempSrc:          SpO2: 99% 98% 96% 96%  Weight:          Height:            General: deconditioned  Neurology: Awake and alert, non focal Head and Neck. Head normocephalic. Neck supple with no adenopathy or thyromegaly.   E ENT: mild pallor, no icterus, oral mucosa moist Cardiovascular: No JVD. S1-S2 present, rhythmic, no gallops, rubs, or murmurs. +++ pitting bilateral lower extremity edema. Pulmonary: positive breath sounds bilaterally, adequate air movement, no wheezing, or rhonchi, positive bilateral rales. Gastrointestinal. Abdomen soft and non tender Skin. Right foot plantar ulcer, clean base, round about 1 cm diameter.  Musculoskeletal: no joint deformities       Labs on Admission: I have personally reviewed following labs and imaging studies   CBC: Last Labs   No results for input(s): WBC, NEUTROABS, HGB, HCT, MCV, PLT in the last 168 hours.   Basic Metabolic Panel: Last Labs      Recent Labs  Lab 11/27/20 1008  NA 136  K 4.4  CL 100  CO2 22  GLUCOSE 178*  BUN 69*  CREATININE 6.38*  CALCIUM 8.4*      GFR: Estimated Creatinine Clearance: 14.9 mL/min (A) (by C-G formula based on SCr of 6.38 mg/dL (H)). Liver Function Tests: Last Labs  No results for input(s): AST, ALT, ALKPHOS, BILITOT, PROT, ALBUMIN in the last 168 hours.   Last Labs   No results for input(s): LIPASE, AMYLASE in the last 168 hours.   Last Labs   No results for input(s): AMMONIA in the last 168 hours.   Coagulation Profile: Last Labs   No results for input(s): INR, PROTIME in the last 168 hours.   Cardiac Enzymes: Last Labs   No results for input(s): CKTOTAL, CKMB, CKMBINDEX, TROPONINI in the last 168 hours.   BNP (last 3 results) Recent Labs (within last 365 days)  No results for input(s): PROBNP in the last 8760 hours.   HbA1C: Recent Labs (last 2 labs)   No results for input(s): HGBA1C in the last 72 hours.    CBG: Last Labs   No results for input(s): GLUCAP in the last 168 hours.   Lipid Profile: Recent Labs (last 2 labs)   No results for input(s): CHOL, HDL, LDLCALC, TRIG, CHOLHDL, LDLDIRECT in the last 72 hours.   Thyroid Function Tests: Recent Labs (last 2 labs)   No results for input(s): TSH, T4TOTAL, FREET4, T3FREE, THYROIDAB in the last 72 hours.   Anemia Panel: Recent Labs (last 2 labs)   No results for input(s): VITAMINB12, FOLATE, FERRITIN, TIBC, IRON, RETICCTPCT in the last 72 hours.   Urine analysis: Labs (Brief)          Component Value Date/Time    COLORURINE YELLOW 03/10/2017 1553    APPEARANCEUR HAZY (A) 03/10/2017 1553    LABSPEC 1.013 03/10/2017 1553    PHURINE 5.0 03/10/2017 1553    GLUCOSEU NEGATIVE 03/10/2017 1553    HGBUR NEGATIVE 03/10/2017 1553    BILIRUBINUR NEGATIVE 03/10/2017 1553    KETONESUR NEGATIVE 03/10/2017 1553    PROTEINUR 30 (A) 03/10/2017 1553    NITRITE NEGATIVE 03/10/2017 1553    LEUKOCYTESUR NEGATIVE 03/10/2017 1553        Radiological Exams on Admission:  Imaging Results (Last 48 hours)  DG Chest Port 1 View   Result Date: 11/27/2020 CLINICAL DATA:  Fall.  Chest and right foot pain. EXAM: PORTABLE CHEST 1 VIEW COMPARISON:  09/01/2020 FINDINGS: Unchanged mild cardiomegaly. Unchanged mild pulmonary vascular congestion. Small bilateral pleural effusions are not significantly changed since prior examination. Interval placement of dialysis catheter. Median sternotomy changes again noted. IMPRESSION: No acute abnormality of the chest. Unchanged findings of mild CHF/fluid volume overload. Electronically Signed   By: Miachel Roux M.D.   On: 11/27/2020 11:24    DG Foot 2 Views Right   Result Date: 11/27/2020 CLINICAL DATA:  wound to lateral fore foot EXAM: RIGHT FOOT - 2 VIEW COMPARISON:  Radiograph 08/18/2020 FINDINGS: There is no evidence of acute fracture or dislocation. Unchanged bulky ossification along the base of the fifth metatarsal,  possibly from prior trauma. There is a lateral forefoot soft tissue defect. There is no frank bony destruction radiographically. Mild midfoot and forefoot degenerative changes. Plantar and dorsal calcaneal spurring. Vascular calcifications. IMPRESSION: Lateral forefoot soft tissue defect. No frank bony destruction radiographically to suggest osteomyelitis. MRI would be more sensitive. Electronically Signed   By: Maurine Simmering M.D.   On: 11/27/2020 11:32       EKG: Independently reviewed.  95 bpm, right axis deviation, normal intervals, sinus rhythm with poor R wave progression, no significant ST segment or T wave changes, low voltage. (Besides right axis deviation no significant change from old EKG)   Assessment/Plan Principal Problem:   Heart failure (HCC) Active  Problems:   Type 2 diabetes mellitus with hyperlipidemia (HCC)   Obesity   S/P CABG x 3   Essential hypertension   ESRD (end stage renal disease) (HCC)   CAD (coronary artery disease)     70 year old male with a significant past medical history for heart failure, chronic atrial fibrillation, hypertension, type 2 diabetes mellitus, coronary artery disease and end-stage renal disease who presents with dyspnea.  Recently started on hemodialysis about 3 months ago.  Cough for the last 2 weeks, last hemodialysis 3 days ago, noted to have increased work of breathing and dyspnea for the last 24 hours.  No chest pain. On his initial physical examination temperature 99.5, blood pressure 145/74, heart rate 94, respirate 20, oxygen saturation 78% on room air, his lungs are rales bilaterally, heart S1-S2, present, irregularly irregular, abdomen soft and nontender, positive 3+ pitting bilateral extremity edema.   Venous pH 7.33, PCO2 42.6, sodium 136, potassium 4.4, chloride 100, bicarb 22, glucose 178, BUN 69, creatinine 6.38, BNP 2754, high sensitive troponin 312-485. D-dimer 4.6. SARS COVID-19 negative   Chest radiograph with cardiomegaly,  positive bilateral hilar vascular congestion, interstitial infiltrates, bilateral small pleural effusions.  Hemodialysis catheter right IJ.   Mr. Lamping is being admitted to the hospital with a working diagnosis of acute hypoxic respiratory failure due to volume overload in the setting of of end-stage renal disease.   Acute hypoxemic respiratory failure due to volume overload, acute diastolic heart failure, in the setting of end-stage renal disease. Patient will be admitted to stepdown unit.  Continue supplemental oxygen per nasal cannula. He continues to make urine, will add 80 mg of intravenous furosemide while he waits for his hemodialysis treatment. Nephrology has been consulted for renal replacement therapy. Access right IJ tunnel catheter.   He has elevated troponin, likely demand ischemia in the setting of acute volume overload/heart failure exacerbation. He has no chest pain or ischemic changes on his EKG.   Further work-up with limited echocardiography, looking for wall motion normalities.   2.  End-stage renal disease/hemodialysis.  Positive volume overload, no hyperkalemia or acidosis. Plan for renal rhythm therapy today.   3.  Ischemic cardiomyopathy, coronary disease status post CABG. Echocardiography from 65/5374 with LV systolic function 60 to 82%.  RV systolic function preserved.  RSVP 57.3.  No significant valvular disease. Continue volume control per ultrafiltration. No chest pain or angina. Elevated troponins likely demand ischemia, follow-up Limited echocardiography.   4.  History of DVT/PE.  Patient will be resumed on apixaban for anticoagulation. Discontinue clopidogrel.   5.  Chronic atrial fibrillation.  Patient currently is rate controlled, he is not on any AV blockade at home. Resume anticoagulation with apixaban.   6.  Type 2 diabetes mellitus.  His admission glucose is controlled, follow-up capillary glucose as needed.   5.  Iron deficiency anemia.   Continue iron supplements.   6. Obesity class 2. Calculated BMI is 35.4    Status is: Inpatient   Remains inpatient appropriate because: respiratory and heart failure    DVT prophylaxis:      Apixban   Code Status:              full  Family Communication:        I spoke with patient's wofe at the bedside, we talked in detail about patient's condition, plan of care and prognosis and all questions were addressed.                Consults called:  Nephrology   Admission status:     Inpatient     Mauricio Gerome Apley MD [facility]

## 2020-12-05 NOTE — Progress Notes (Signed)
Patient ID: Travis Blair, male   DOB: 1950/06/20, 70 y.o.   MRN: [id number] S: Feeling "wore out".  Tolerated HD yesterday with 3 liters UF.  AVF with increased venous pressures but able to perform 4 hour treatment. O:BP 124/68 (BP Location: Right Arm)   Pulse 82   Temp 98.5 F (36.9 C)   Resp 19   Ht 6\' 2"  (1.88 m)   Wt 114.6 kg   SpO2 100%   BMI 32.44 kg/m   Intake/Output Summary (Last 24 hours) at 12/05/2020 1014 Last data filed at 12/04/2020 1839 Gross per 24 hour  Intake 120 ml  Output 3050 ml  Net -2930 ml   Intake/Output: I/O last 3 completed shifts: In: 120 [P.O.:120] Out: 3050 [Other:3050]  Intake/Output this shift:  No intake/output data recorded. Weight change: 0 kg Gen:NAD CVS: RRR Resp: CTA Abd: +BS, soft, NT/ND Ext: trace-1+ pretibial edema, ulcer on right foot dressed.  LUE AVF +T/B  Recent Labs  Lab 11/29/20 0440 11/30/20 0453 12/01/20 0538 12/02/20 0435 12/04/20 0341 12/05/20 0757  NA 131* 133* 130*  --  129* 129*  K 4.1 3.9 4.3  --  4.2 4.0  CL 95* 97* 93*  --  91* 92*  CO2 26 25 24   --  23 26  GLUCOSE 138* 120* 162*  --  283* 237*  BUN 60* 49* 69*  --  97* 71*  CREATININE 6.10* 5.11* 6.35*  --  7.55* 6.28*  ALBUMIN  --  2.5* 2.5* 3.1* 2.6* 2.6*  CALCIUM 7.9* 8.0* 8.4*  --  8.1* 8.0*  PHOS  --  3.2 3.9  --   --   --   AST  --   --   --  347* 138* 88*  ALT  --   --   --  130* 180* 141*   Liver Function Tests: Recent Labs  Lab 12/02/20 0435 12/04/20 0341 12/05/20 0757  AST 347* 138* 88*  ALT 130* 180* 141*  ALKPHOS 174* 194* 184*  BILITOT 1.6* 0.8 0.8  PROT 6.9 6.0* 6.5  ALBUMIN 3.1* 2.6* 2.6*   No results for input(s): LIPASE, AMYLASE in the last 168 hours. No results for input(s): AMMONIA in the last 168 hours. CBC: Recent Labs  Lab 12/01/20 0538 12/02/20 0435 12/03/20 0447 12/04/20 0341 12/05/20 0757  WBC 14.1* 11.3* 12.0* 13.9* 16.0*  HGB 9.3* 9.8* 9.4* 9.7* 10.5*  HCT 30.3* 32.9* 31.7* 31.5* 34.0*  MCV 90.4 91.4  91.1 89.0 89.5  PLT 166 178 207 214 226   Cardiac Enzymes: No results for input(s): CKTOTAL, CKMB, CKMBINDEX, TROPONINI in the last 168 hours. CBG: Recent Labs  Lab 11/29/20 2009 12/02/20 1419 12/02/20 1544  GLUCAP 130* 137* 135*    Iron Studies: No results for input(s): IRON, TIBC, TRANSFERRIN, FERRITIN in the last 72 hours. Studies/Results: US Abdomen Complete  Result Date: 12/04/2020 CLINICAL DATA:  Elevated LFT EXAM: ABDOMEN ULTRASOUND COMPLETE COMPARISON:  Renal ultrasound 04/18/2017 FINDINGS: Gallbladder: No shadowing stones. Small fluid around the gallbladder. Negative sonographic Murphy. Increased wall thickness of 5.2 mm. Common bile duct: Diameter: 4.8 mm Liver: No focal lesion identified. Within normal limits in parenchymal echogenicity. Portal vein is patent on color Doppler imaging with normal direction of blood flow towards the liver. IVC: No abnormality visualized. Pancreas: Obscured by bowel gas Spleen: Slightly enlarged with volume of 526 mL. Right Kidney: Length: 12.3 cm.  Lower pole cyst measuring 2.2 cm. Left Kidney: Length: 13.3 cm. Echogenicity within normal limits. No mass  or hydronephrosis visualized. Abdominal aorta: No aneurysm visualized. Other findings: Incidental bilateral pleural effusions. Small amount of ascites within the abdomen. IMPRESSION: 1. Negative for gallstones. Slight increased gallbladder wall thickness but negative sonographic Percell Miller, findings are nonspecific. There is a small amount of fluid adjacent to the gallbladder likely related to generalized small ascites in the right upper quadrant. 2. Liver grossly within normal limits. 3. Mild splenomegaly 4. Small amount of ascites within the upper and lower abdomen. 5. Bilateral pleural effusions Electronically Signed   By: Donavan Foil M.D.   On: 12/04/2020 16:00    calcitRIOL  0.5 mcg Oral Q M,W,F   calcium acetate  1,334 mg Oral TID with meals   Chlorhexidine Gluconate Cloth  6 each Topical Q0600    loratadine  10 mg Oral q1800   mouth rinse  15 mL Mouth Rinse BID   mupirocin ointment   Nasal BID   pantoprazole  40 mg Oral Daily   silver sulfADIAZINE   Topical BID    BMET    Component Value Date/Time   NA 129 (L) 12/05/2020 0757   NA 138 09/02/2019 1331   K 4.0 12/05/2020 0757   CL 92 (L) 12/05/2020 0757   CO2 26 12/05/2020 0757   GLUCOSE 237 (H) 12/05/2020 0757   BUN 71 (H) 12/05/2020 0757   BUN 51 (H) 09/02/2019 1331   CREATININE 6.28 (H) 12/05/2020 0757   CREATININE 3.49 (H) 05/23/2020 1426   CALCIUM 8.0 (L) 12/05/2020 0757   GFRNONAA 9 (L) 12/05/2020 0757   GFRNONAA 17 (L) 05/23/2020 1426   GFRAA 20 (L) 05/23/2020 1426   CBC    Component Value Date/Time   WBC 16.0 (H) 12/05/2020 0757   RBC 3.80 (L) 12/05/2020 0757   HGB 10.5 (L) 12/05/2020 0757   HGB 11.6 (L) 09/02/2019 1331   HCT 34.0 (L) 12/05/2020 0757   HCT 37.8 09/02/2019 1331   PLT 226 12/05/2020 0757   PLT 231 09/02/2019 1331   MCV 89.5 12/05/2020 0757   MCV 86 09/02/2019 1331   MCH 27.6 12/05/2020 0757   MCHC 30.9 12/05/2020 0757   RDW 18.3 (H) 12/05/2020 0757   RDW 14.6 09/02/2019 1331   LYMPHSABS 0.8 09/01/2020 2140   LYMPHSABS 1.4 09/02/2019 1331   MONOABS 0.9 09/01/2020 2140   EOSABS 0.1 09/01/2020 2140   EOSABS 0.2 09/02/2019 1331   BASOSABS 0.0 09/01/2020 2140   BASOSABS 0.0 09/02/2019 1331    Dialysis Orders: Center: [facility]  on MWF . EDW 120.5kg HD [place] 3K/2.5Ca  Time 4 hours Heparin 2000 units bolus. Access RIJ TDC (maturing LUE AVF) BFR 400 DFR 800    Calcitriol 0.5 mcg po/HD Venofer  100 mg TIW, Micera 100 mcg IVP every 2 weeks      Assessment/Plan:  Acute hypoxic respiratory failure - presumably due to volume overload and acute diastolic CHF.  Improved with ongoing HD and UF.   Will continue to challenge edw (already 6 kg below edw).  ESRD -  continue with HD MWF while he remains an inpatient.  Hypertension/volume  - as above, will continue to challenge edw.  Anemia  - stable   Metabolic bone disease -  continue with home meds  Nutrition -  renal diet, carb modified.  Chronic atrial fibrillation - rate controlled History of DVT/PE - to resume apixaban. ID  - had fever with + blood cultures, initially for MRSA and now E. Coli (12/03/20).  TDC removed 11/29/20 and on IV cefepime and Vancomycin.  Continue to follow. For TEE today to r/o SBE. New blood cultures drawn today. Vascular access - TDC removed 10/19 and using LUE AVF but has high venous pressures.  Will hold off on new [facility] for now and continue to use AVF.  Donetta Potts, MD [employer] [phone]

## 2020-12-05 NOTE — Anesthesia Postprocedure Evaluation (Signed)
Anesthesia Post Note  Patient: Travis Blair  Procedure(s) Performed: TRANSESOPHAGEAL ECHOCARDIOGRAM (TEE)  Patient location during evaluation: PACU Anesthesia Type: General Level of consciousness: awake and alert and oriented Pain management: pain level controlled Vital Signs Assessment: post-procedure vital signs reviewed and stable Respiratory status: spontaneous breathing, nonlabored ventilation, respiratory function stable and patient connected to nasal cannula oxygen Cardiovascular status: blood pressure returned to baseline and stable Postop Assessment: no apparent nausea or vomiting Anesthetic complications: no   No notable events documented.   Last Vitals:  Vitals:   12/05/20 1505 12/05/20 1515  BP: 124/61 (!) 143/59  Pulse: 80 75  Resp: (!) 29 17  Temp: 36.6 C   SpO2: 97% 98%    Last Pain:  Vitals:   12/05/20 1520  TempSrc:   PainSc: 0-No pain                 Willo Yoon C Verdell Kincannon

## 2020-12-05 NOTE — Progress Notes (Signed)
[facility]  No TEE yesterday. Leukocytosis up some.  Foot wound inspected, cleansed with saline, areas opened and able to drain. Erythema about the same. Redressed with silvadene. Doing BID dressing changes now. Continue antibiotics.     Curlene Labrum, MD [facility] [address] [place], [place] [address] [phone] (office)

## 2020-12-05 NOTE — Progress Notes (Signed)
Patient scheduled for TEE for bacteremia today at 2:30 by Dr. Harl Bowie. He's NPO, labs reviewed and stable. Had dialysis yesterday. Patient comfortable without questions. Lungs clear, heart irreg irreg, tele-afib rate controlled.

## 2020-12-05 NOTE — CV Procedure (Signed)
CV Procedure Note  Procedure: Transesophageal echocardiogram Physician: Dr Carlyle Dolly Indication: Bacteremia   Patient was brought to the procedure suit after appropriate consent was obtained. Sedation was achieved with the assistance of anesthesiology, for details please refer to there documentation. Bite block placed and patient placed in the left lateral decubtis position. TEE probe intubated into esophagus and several views obtained. Please see full TEE report for complete findings, in summary no evidence of vegetations. Cardiopulmonary monitoring performed throughout the procedure, he tolerated well without complications   Carlyle Dolly MD

## 2020-12-05 NOTE — Progress Notes (Signed)
ANTICOAGULATION CONSULT NOTE  Pharmacy Consult for Heparin Indication: hx DVT/afib  Allergies  Allergen Reactions   Latex Hives and Rash   Lipitor [Atorvastatin]     Leg aches     Patient Measurements: Height: 6\' 2"  (188 cm) Weight: 114.6 kg (252 lb 10.4 oz) IBW/kg (Calculated) : 82.2 HEPARIN DW (KG): 107.6   Vital Signs: Temp: 97.7 F (36.5 C) (10/25 1616) Temp Source: Oral (10/25 1256) BP: 139/57 (10/25 1616) Pulse Rate: 79 (10/25 1616)  Labs: Recent Labs    12/03/20 0447 12/03/20 1039 12/03/20 1904 12/04/20 0341 12/04/20 0947 12/04/20 1411 12/05/20 0019 12/05/20 0757 12/05/20 1958  HGB 9.4*  --   --  9.7*  --   --   --  10.5*  --   HCT 31.7*  --   --  31.5*  --   --   --  34.0*  --   PLT 207  --   --  214  --   --   --  226  --   APTT 45* 40*   < >  --  48*   < > 44* 73* 50*  HEPARINUNFRC >1.10* 0.99*  --   --  0.57  --   --  0.51  --   CREATININE  --   --   --  7.55*  --   --   --  6.28*  --    < > = values in this interval not displayed.     Estimated Creatinine Clearance: 14.9 mL/min (A) (by C-G formula based on SCr of 6.28 mg/dL (H)).   Assessment: 70 y.o. male on apixaban PTA for h/o DVT/PE and Afib, now being held. Last LE doppler July 2022 was negative. Pharmacy consulted for heparin.  aPTT dropped to 50, which is subtherapeutic.  No issues with infusion or bleeding per RN. Unclear why it decreased. Patient is on about 24 units/kg/hr but aPTT is responding.   Goal of Therapy:  APTT 66-102 sec Heparin level 0.3-0.7 units/ml Monitor platelets by anticoagulation protocol: Yes   Plan:  Increase Heparin infusion to 2800 units/hr F/u aPTT until correlates with heparin level  Monitor daily aPTT, HL, CBC/plt Monitor for signs/symptoms of bleeding  F/u restart apixaban   Benetta Spar, PharmD, BCPS, [facility] Clinical Pharmacist  Please check AMION for all [place] phone numbers After 10:00 PM, call [place]

## 2020-12-05 NOTE — Progress Notes (Addendum)
Tele called with 17 beat run vtach.  New vitals stable.  Continues afib at 78. Contacted Dr. Carles Collet

## 2020-12-05 NOTE — Progress Notes (Signed)
    [facility] for [facility]ious [facility]   Reason for visit: Follow up on MRSA and E coli bacteremia.   Interval History: TEE without vegetation.  Repeat cultures sent today and last cultures again positive from 10/23 with E coli and Staph aureus.  Getting dialysis from his AVF and no new catheter needed at this time.   Physical Exam: Constitutional:  Vitals:   12/05/20 1505 12/05/20 1515  BP: 124/61 (!) 143/59  Pulse: 80 75  Resp: (!) 29 17  Temp: 97.9 F (36.6 C)   SpO2: 97% 98%    Impression: At this time, TEE negative for vegetation but foot looks infected with pus and early osteo.  Unclear if E coli, Staph or both.  I recommend 6 weeks of IV vancomycin and cefazolin with dialysis for the osteomyelitis.  MRI of back ordered as well and if positive may need a more prolonged course.    Plan: 1. Continue antibiotics.

## 2020-12-05 NOTE — Anesthesia Preprocedure Evaluation (Signed)
Anesthesia Evaluation  Patient identified by MRN, date of birth, ID band Patient awake    Reviewed: Allergy & Precautions, NPO status , Patient's Chart, lab work & pertinent test results  Airway Mallampati: III  TM Distance: >3 FB Neck ROM: Full    Dental  (+) Dental Advisory Given, Missing Crowns :   Pulmonary asthma , sleep apnea and Continuous Positive Airway Pressure Ventilation , pneumonia, PE   Pulmonary exam normal breath sounds clear to auscultation       Cardiovascular Exercise Tolerance: Good hypertension, Pt. on medications + CAD, + Past MI, + CABG and +CHF  + dysrhythmias Atrial Fibrillation  Rhythm:Irregular Rate:Normal  1. Left ventricular ejection fraction, by estimation, is 45 to 50%. The left ventricle has mildly decreased function. The left ventricle demonstrates regional wall motion abnormalities (see scoring  diagram/findings for description). There is severe  asymmetric left ventricular hypertrophy of the basal and septal segments.  Left ventricular diastolic parameters are indeterminate. There is the interventricular septum is flattened in systole and diastole, consistent with right ventricular pressure and volume overload.  2. Right ventricular systolic function is low normal. The right ventricular size is mildly enlarged. There is moderately elevated  pulmonary artery systolic pressure. The estimated right ventricular  systolic pressure is 09.4 mmHg.  3. Left atrial size was mildly dilated.  4. Right atrial size was mildly dilated.  5. Left pleural effusion evident. A small pericardial effusion is  present. The pericardial effusion is posterior to the left ventricle.  6. The mitral valve is abnormal. Mild mitral valve regurgitation.  7. The aortic valve is tricuspid. There is moderate calcification of the  aortic valve. Aortic valve regurgitation is not visualized.  8. The inferior vena cava is  dilated in size with <50% respiratory variability, suggesting right atrial pressure of 15 mmHg.    Neuro/Psych PSYCHIATRIC DISORDERS Depression  Neuromuscular disease    GI/Hepatic GERD  Medicated,  Endo/Other  diabetes, Well Controlled, Type 2Hypothyroidism   Renal/GU ESRFRenal disease     Musculoskeletal negative musculoskeletal ROS (+)   Abdominal   Peds  Hematology  (+) anemia ,   Anesthesia Other Findings   Reproductive/Obstetrics negative OB ROS                             Anesthesia Physical  Anesthesia Plan  ASA: 4  Anesthesia Plan: General   Post-op Pain Management:    Induction: Intravenous  PONV Risk Score and Plan: TIVA  Airway Management Planned: Nasal Cannula and Natural Airway  Additional Equipment:   Intra-op Plan:   Post-operative Plan: Possible Post-op intubation/ventilation  Informed Consent: I have reviewed the patients History and Physical, chart, labs and discussed the procedure including the risks, benefits and alternatives for the proposed anesthesia with the patient or authorized representative who has indicated his/her understanding and acceptance.     Dental advisory given  Plan Discussed with: Surgeon and CRNA  Anesthesia Plan Comments:         Anesthesia Quick Evaluation

## 2020-12-05 NOTE — Progress Notes (Addendum)
PROGRESS NOTE  Travis Blair JXB:[id number] DOB: [birth year] DOA: 11/27/2020 PCP: [name], MD  Brief History:   70 y.o. male with medical history significant of CHF, ERSD on HD (M-W-F), CAD, DVT/PE, and T2DM, CABG with post operative atrial fibrillation, pulmonary hypertension who presented with dyspnea.  Patient reported having cough for about 2 weeks, persistent and worsening.  Tried several antitussive agents at home without improvement of symptoms.  He went to dialysis on Friday, oct 14. He e was noted to be dyspneic by his wife, moderate in intensity, associated with cough.  He has had a chronic cough for at least one year, worse over last week prior to [place].  He took some robitussin and codeine from an old prescription on 11/26/20.  On Monday, he was more unsteady and fell in the BR.  He had subjective f/c at home since 11/24/20.  He denied n/v/d, abd pain.     His main symptom seems to be increased work of breathing/dyspnea, moderate to severe in intensity, worse with exertion, associated with cough, no chest pain, no improving factors. Last night not able to sleep due to persistent cough, dry.  He continues to make urine 3 times daily.   In June 2022 he decided to stop apixaban due to weakness, he was placed on clopidogrel. Last year he had COVID-pneumonia and pulm embolism. About 3 months ago he was started on hemodialysis, he has a fistula which is not mature yet.  He uses a tunneled dialysis catheter for dialysis.  Since admission, nephrology was consulted to assist.  He was dialyzed on 10/17 and 10/19.  He was found to have MRSA bacteremia.  General surgery was consulted to remove his Permacath.  He was started on IV vancomycin 11/29/20.  Repeat blood culture on 10/20 showed Ecoli, and he was started on cefepime.   Assessment/Plan: Sepsis  -present on admission -had fever and leukocytosis -due to MRSA bacteremia -sepsis physiology resolved   MRSA  bacteremia/EColi Bacteremia -removed tunneled HD catheter 10/19 after HD -10/18 blood culture = MRSA (peripheral) -10/20 blood culture = E Coli (peripheral) -12/01/20 blood culture = S aureus (on HD via fistula) -12/03/20 blood culture = GNR (peripheral) -12/05/20 Blood culture = neg -11/28/20 TTE--no obvious vegetation -able to to use fistula for HD 10/21 -12/05/20 TEE--no vegetation -12/05/20 MR lumbar>>no abscess or osteomyelitis/discitis -will need to delay permacath placement until neg blood culture -12/05/20--discussed with ID, Dr. Henreitta Cea current abx for now   [facility]acte[facility] -continue cefepime on HD>>plan to switch to cefazolin on HD based on susceptibilities -10/20 blood culture = EColi--no MRSA -10/23 blood culture= EColi + MRSA -10/25 Blood culture  =neg   Right Diabetic foot ulcerAcute Osteomyelitis 5th metatarsal -spouse states wound is improving with care at home -MR right foot--Soft tissue ulceration along the lateral forefoot near the fifth metatarsal head with underlying 2.3 cm curvilinear fluid collection,concerning for abscess -consult wound care -discussed with surgery--foot debrided 10/22>>no pus -10/24 wider foot debridement with small amount purulence -appreciate general surgery, Dr. Constance Haw   Acute respiratory failure with hypoxia -due to pulmonary edema/pleural effusions -presented with oxygen saturation 87% on RA and tachypnea -stable on 3L -wean off oxygen for saturation >92%   Acute on chronic diastolic CHF/Acute systolic CHF -03/12/84 Echo 60-65%, no WMA; RV systolic function preserved.  RSVP 57.3.  No significant valvular disease. -fluid removal on HD -11/28/20 Echo EF 45-50%+WMA, RV overload, no vegetation -stable on 2L  Dysphagia -12/02/20 EGD--no esophageal abnormality to explain dysphagia; dilated; mild portal hypertensive gastropathy -suspect he has esophageal dysmotility from his medical issues -swallowing better since  dilatation   ESRD -appreciate renal consult -HD on 10/17, 10/19, 10/21, 10/24 -10/21, 10/24--able to use fistula   History DVT/PE -hold apixaban temporarily in preparation for new catheter insertion -restart apixaban if no further procedures   Chronic Afib -AC as discussed above -continue IV heparin   Uncontrolled Diabetes mellitus type 2 with hyperglycemia -08/18/20 A1C--8.0 -CBGs controlled so far -novolog sliding scale -10/19- A1C 6.3 -CBGs controlled   Right Elbow pain -due to recent fall on 11/27/20 -xray--no fracture; small anterior joint eff -improved--no further synovitis   Transaminasemia -likely due to sepsis and hemodynamic changes -trending down -f/u abd US--neg gallstones, slight GB thickening without murphy's; mild splenomegaly -repeat LFTs in am       Status is: Inpatient   Remains inpatient appropriate because: severity of illness               Family Communication:   daughter updated 10/25   Consultants:  renal, general surgery, GI, ID   Code Status:  FULL   DVT Prophylaxis:  IV Heparin      Procedures: As Listed in Progress Note Above   Antibiotics: Vanco 10/19>> Cefepime 10/21>>10/25 Cefazolin 10/26>>         Subjective: Patient denies fevers, chills, headache, chest pain, dyspnea, nausea, vomiting, diarrhea, abdominal pain, dysuria, hematuria, hematochezia, and melena.   Objective: Vitals:   12/05/20 1357 12/05/20 1505 12/05/20 1515 12/05/20 1616  BP: (!) 90/40 124/61 (!) 143/59 (!) 139/57  Pulse:  80 75 79  Resp:  (!) 29 17 20   Temp:  97.9 F (36.6 C)  97.7 F (36.5 C)  TempSrc:      SpO2:  97% 98% 95%  Weight:      Height:        Intake/Output Summary (Last 24 hours) at 12/05/2020 1829 Last data filed at 12/05/2020 1735 Gross per 24 hour  Intake 360 ml  Output 0 ml  Net 360 ml   Weight change: 0 kg Exam:  General:  Pt is alert, follows commands appropriately, not in acute distress HEENT: No icterus,  No thrush, No neck mass, [place]/AT Cardiovascular: RRR, S1/S2, no rubs, no gallops Respiratory: bibasilar rales.  No wheeze Abdomen: Soft/+BS, non tender, non distended, no guarding Extremities: No edema, No lymphangitis, No petechiae, No rashes, no synovitis   Data Reviewed: I have personally reviewed following labs and imaging studies Basic Metabolic Panel: Recent Labs  Lab 11/29/20 0440 11/30/20 0453 12/01/20 0538 12/04/20 0341 12/05/20 0757  NA 131* 133* 130* 129* 129*  K 4.1 3.9 4.3 4.2 4.0  CL 95* 97* 93* 91* 92*  CO2 26 25 24 23 26   GLUCOSE 138* 120* 162* 283* 237*  BUN 60* 49* 69* 97* 71*  CREATININE 6.10* 5.11* 6.35* 7.55* 6.28*  CALCIUM 7.9* 8.0* 8.4* 8.1* 8.0*  PHOS  --  3.2 3.9  --   --    Liver Function Tests: Recent Labs  Lab 11/30/20 0453 12/01/20 0538 12/02/20 0435 12/04/20 0341 12/05/20 0757  AST  --   --  347* 138* 88*  ALT  --   --  130* 180* 141*  ALKPHOS  --   --  174* 194* 184*  BILITOT  --   --  1.6* 0.8 0.8  PROT  --   --  6.9 6.0* 6.5  ALBUMIN 2.5* 2.5* 3.1* 2.6* 2.6*  No results for input(s): LIPASE, AMYLASE in the last 168 hours. No results for input(s): AMMONIA in the last 168 hours. Coagulation Profile: No results for input(s): INR, PROTIME in the last 168 hours. CBC: Recent Labs  Lab 12/01/20 0538 12/02/20 0435 12/03/20 0447 12/04/20 0341 12/05/20 0757  WBC 14.1* 11.3* 12.0* 13.9* 16.0*  HGB 9.3* 9.8* 9.4* 9.7* 10.5*  HCT 30.3* 32.9* 31.7* 31.5* 34.0*  MCV 90.4 91.4 91.1 89.0 89.5  PLT 166 178 207 214 226   Cardiac Enzymes: No results for input(s): CKTOTAL, CKMB, CKMBINDEX, TROPONINI in the last 168 hours. BNP: Invalid input(s): POCBNP CBG: Recent Labs  Lab 11/29/20 2009 12/02/20 1419 12/02/20 1544  GLUCAP 130* 137* 135*   HbA1C: No results for input(s): HGBA1C in the last 72 hours. Urine analysis:    Component Value Date/Time   COLORURINE YELLOW 03/10/2017 1553   APPEARANCEUR HAZY (A) 03/10/2017 1553   LABSPEC  1.013 03/10/2017 1553   PHURINE 5.0 03/10/2017 1553   GLUCOSEU NEGATIVE 03/10/2017 1553   HGBUR NEGATIVE 03/10/2017 1553   BILIRUBINUR NEGATIVE 03/10/2017 1553   KETONESUR NEGATIVE 03/10/2017 1553   PROTEINUR 30 (A) 03/10/2017 1553   NITRITE NEGATIVE 03/10/2017 1553   LEUKOCYTESUR NEGATIVE 03/10/2017 1553   Sepsis Labs: @LABRCNTIP (procalcitonin:4,lacticidven:4) ) Recent Results (from the past 240 hour(s))  Resp Panel by RT-PCR (Flu A&B, Covid) Nasopharyngeal Swab     Status: None   Collection Time: 11/27/20 10:09 AM   Specimen: Nasopharyngeal Swab; Nasopharyngeal(NP) swabs in vial transport medium  Result Value Ref Range Status   SARS Coronavirus 2 by RT PCR NEGATIVE NEGATIVE Final    Comment: (NOTE) SARS-CoV-2 target nucleic acids are NOT DETECTED.  The SARS-CoV-2 RNA is generally detectable in upper respiratory specimens during the acute phase of infection. The lowest concentration of SARS-CoV-2 viral copies this assay can detect is 138 copies/mL. A negative result does not preclude SARS-Cov-2 infection and should not be used as the sole basis for treatment or other patient management decisions. A negative result may occur with  improper specimen collection/handling, submission of specimen other than nasopharyngeal swab, presence of viral mutation(s) within the areas targeted by this assay, and inadequate number of viral copies(<138 copies/mL). A negative result must be combined with clinical observations, patient history, and epidemiological information. The expected result is Negative.  Fact Sheet for Patients:  EntrepreneurPulse.com.au  Fact Sheet for Healthcare Providers:  IncredibleEmployment.be  This test is no t yet approved or cleared by the [place] FDA and  has been authorized for detection and/or diagnosis of SARS-CoV-2 by FDA under an Emergency Use Authorization (EUA). This EUA will remain  in effect (meaning this test  can be used) for the duration of the COVID-19 declaration under Section 564(b)(1) of the Act, 21 U.S.C.section 360bbb-3(b)(1), unless the authorization is terminated  or revoked sooner.       Influenza A by PCR NEGATIVE NEGATIVE Final   Influenza B by PCR NEGATIVE NEGATIVE Final    Comment: (NOTE) The Xpert Xpress SARS-CoV-2/FLU/RSV plus assay is intended as an aid in the diagnosis of influenza from Nasopharyngeal swab specimens and should not be used as a sole basis for treatment. Nasal washings and aspirates are unacceptable for Xpert Xpress SARS-CoV-2/FLU/RSV testing.  Fact Sheet for Patients: EntrepreneurPulse.com.au  Fact Sheet for Healthcare Providers: IncredibleEmployment.be  This test is not yet approved or cleared by the [place] FDA and has been authorized for detection and/or diagnosis of SARS-CoV-2 by FDA under an Emergency Use Authorization (EUA). This EUA  will remain in effect (meaning this test can be used) for the duration of the COVID-19 declaration under Section 564(b)(1) of the Act, 21 U.S.C. section 360bbb-3(b)(1), unless the authorization is terminated or revoked.  Performed at [facility], [address], [place], [place] [address]   MRSA Next Gen by PCR, Nasal     Status: Abnormal   Collection Time: 11/27/20  4:10 PM   Specimen: Nasal Mucosa; Nasal Swab  Result Value Ref Range Status   MRSA by PCR Next Gen DETECTED (A) NOT DETECTED Final    Comment: RESULT CALLED TO, READ BACK BY AND VERIFIED WITH: HEATHER TETREAULT,1951,11/27/2020,SELF S. (NOTE) The GeneXpert MRSA Assay (FDA approved for NASAL specimens only), is one component of a comprehensive MRSA colonization surveillance program. It is not intended to diagnose MRSA infection nor to guide or monitor treatment for MRSA infections. Test performance is not FDA approved in patients less than 78 years old. Performed at [facility], [address]  [place], [place] [address]   Culture, blood (Routine X 2) w Reflex to ID Panel     Status: Abnormal   Collection Time: 11/28/20  9:34 AM   Specimen: BLOOD RIGHT HAND  Result Value Ref Range Status   Specimen Description   Final    BLOOD RIGHT HAND Performed at [facility], [address] [place], [place] [address]    Special Requests   Final    Immunocompromised Blood Culture adequate volume BOTTLES DRAWN AEROBIC AND ANAEROBIC Performed at [facility], [address] [place], [place] [address]    Culture  Setup Time   Final    GRAM POSITIVE COCCI IN BOTH AEROBIC AND ANAEROBIC BOTTLES Gram Stain Report Called to,Read Back By and Verified WithLynwood Dawley, 2216, 11/28/2020,SELF S aerobic bottle previously called 10.18.22 2353 matthews, b  Performed at [facility], [address] [place], [place] [address]    Culture (A)  Final    STAPHYLOCOCCUS AUREUS SUSCEPTIBILITIES PERFORMED ON PREVIOUS CULTURE WITHIN THE LAST 5 DAYS. Performed at [facility], [place] [address] [place], [place] [address]    Report Status 12/02/2020 FINAL  Final  Culture, blood (Routine X 2) w Reflex to ID Panel     Status: Abnormal   Collection Time: 11/28/20  9:34 AM   Specimen: BLOOD RIGHT FOREARM  Result Value Ref Range Status   Specimen Description   Final    BLOOD RIGHT FOREARM Performed at [facility], [address], [place], [place] [address]    Special Requests   Final    Immunocompromised Blood Culture adequate volume BOTTLES DRAWN AEROBIC AND ANAEROBIC Performed at [facility], [address], [place], [place] [address]    Culture  Setup Time   Final    GRAM POSITIVE COCCI IN BOTH AEROBIC AND ANAEROBIC BOTTLES Gram Stain Report Called to,Read Back By and Verified With: T.ALLEN,2236,11/28/2020,SELF S aerobic bottle gpc previously called matthews, b 10.18.22 2355 CRITICAL RESULT CALLED TO, READ BACK BY AND VERIFIED WITH: T ALLEN,RN@0209  11/29/20 [place]    Culture (A)  Final    METHICILLIN  RESISTANT STAPHYLOCOCCUS AUREUS VIRIDANS STREPTOCOCCUS THE SIGNIFICANCE OF ISOLATING THIS ORGANISM FROM A SINGLE SET OF BLOOD CULTURES WHEN MULTIPLE SETS ARE DRAWN IS UNCERTAIN. PLEASE NOTIFY THE MICROBIOLOGY DEPARTMENT WITHIN ONE WEEK IF SPECIATION AND SENSITIVITIES ARE REQUIRED. Performed at [facility], [place] [address], [place], [place] [address]    Report Status 12/02/2020 FINAL  Final   Organism ID, Bacteria METHICILLIN RESISTANT STAPHYLOCOCCUS AUREUS  Final      Susceptibility   Methicillin resistant staphylococcus aureus - MIC*    CIPROFLOXACIN >=8 RESISTANT Resistant     ERYTHROMYCIN >=8 RESISTANT Resistant     GENTAMICIN <=0.5 SENSITIVE Sensitive     OXACILLIN >=4 RESISTANT Resistant     TETRACYCLINE <=1 SENSITIVE Sensitive     VANCOMYCIN 1 SENSITIVE Sensitive     TRIMETH/SULFA <=10 SENSITIVE Sensitive     CLINDAMYCIN >=8 RESISTANT Resistant     RIFAMPIN <=0.5 SENSITIVE Sensitive     Inducible Clindamycin NEGATIVE Sensitive     * METHICILLIN RESISTANT STAPHYLOCOCCUS AUREUS  Blood Culture ID Panel (Reflexed)     Status: Abnormal   Collection Time: 11/28/20  9:34 AM  Result Value Ref Range Status   Enterococcus faecalis NOT DETECTED NOT DETECTED Final   Enterococcus Faecium NOT DETECTED NOT DETECTED Final   Listeria monocytogenes NOT DETECTED NOT DETECTED Final   Staphylococcus species DETECTED (A) NOT DETECTED Final    Comment: CRITICAL RESULT CALLED TO, READ BACK BY AND VERIFIED WITH: T ALLEN,RN@0209  11/29/20 [place]    Staphylococcus aureus (BCID) DETECTED (A) NOT DETECTED Final    Comment: Methicillin (oxacillin)-resistant Staphylococcus aureus (MRSA). MRSA is predictably resistant to beta-lactam antibiotics (except ceftaroline). Preferred therapy is vancomycin unless clinically contraindicated. Patient requires contact precautions if  hospitalized. CRITICAL RESULT CALLED TO, READ BACK BY AND VERIFIED WITH: T ALLEN,RN@0209  11/29/20 [place]    Staphylococcus epidermidis NOT  DETECTED NOT DETECTED Final   Staphylococcus lugdunensis NOT DETECTED NOT DETECTED Final   Streptococcus species DETECTED (A) NOT DETECTED Final    Comment: Not Enterococcus species, Streptococcus agalactiae, Streptococcus pyogenes, or Streptococcus pneumoniae. CRITICAL RESULT CALLED TO, READ BACK BY AND VERIFIED WITH: T ALLEN,RN@ 0209 11/29/20 [place]    Streptococcus agalactiae NOT DETECTED NOT DETECTED Final   Streptococcus pneumoniae NOT DETECTED NOT DETECTED Final   Streptococcus pyogenes NOT DETECTED NOT DETECTED Final   A.calcoaceticus-baumannii NOT DETECTED NOT DETECTED Final   Bacteroides fragilis NOT DETECTED NOT DETECTED Final   Enterobacterales NOT DETECTED NOT DETECTED Final   Enterobacter cloacae complex NOT DETECTED NOT DETECTED Final   Escherichia coli NOT DETECTED NOT DETECTED Final   Klebsiella aerogenes NOT DETECTED NOT DETECTED Final   Klebsiella oxytoca NOT DETECTED NOT DETECTED Final   Klebsiella pneumoniae NOT DETECTED NOT DETECTED Final   Proteus species NOT DETECTED NOT DETECTED Final   Salmonella species NOT DETECTED NOT DETECTED Final   Serratia marcescens NOT DETECTED NOT DETECTED Final   Haemophilus influenzae NOT DETECTED NOT DETECTED Final   Neisseria meningitidis NOT DETECTED NOT DETECTED Final   Pseudomonas aeruginosa NOT DETECTED NOT DETECTED Final   Stenotrophomonas maltophilia NOT DETECTED NOT DETECTED Final   Candida albicans NOT DETECTED NOT DETECTED Final   Candida auris NOT DETECTED NOT DETECTED Final   Candida glabrata NOT DETECTED NOT DETECTED Final   Candida krusei NOT DETECTED NOT DETECTED Final   Candida parapsilosis NOT DETECTED NOT DETECTED Final   Candida tropicalis NOT DETECTED NOT DETECTED Final   Cryptococcus neoformans/gattii NOT DETECTED NOT DETECTED Final   Meth resistant mecA/C and MREJ DETECTED (A) NOT DETECTED Final    Comment: CRITICAL RESULT CALLED TO, READ BACK BY AND VERIFIED WITH: T ALLEN,RN@0209  11/29/20 [place] Performed at  [facility], [address] [place], [place] [address]   Culture, blood (Routine X 2) w Reflex to ID Panel     Status: Abnormal   Collection Time: 11/30/20  4:53 AM   Specimen: BLOOD RIGHT HAND  Result Value Ref Range Status   Specimen Description   Final    BLOOD RIGHT HAND Performed at [facility], [address] [place], [place] [address]    Special Requests   Final    BOTTLES DRAWN AEROBIC AND ANAEROBIC Blood Culture adequate volume Performed at [place] [place], [place] [address]    Culture  Setup Time   Final    GRAM NEGATIVE RODS AEROBIC BOTTLE ONLY Gram Stain Report Called to,Read Back By and Verified With: EVANS T.,2003,11/30/2020,SELF S CRITICAL RESULT CALLED TO, READ BACK BY AND VERIFIED WITH: S,HURTH PHARMD @1216  12/01/20 EB Performed at [facility], [place] [address], [place], [place] [address]    Culture ESCHERICHIA COLI (A)  Final   Report Status 12/02/2020 FINAL  Final   Organism ID, Bacteria ESCHERICHIA COLI  Final      Susceptibility   Escherichia coli - MIC*    AMPICILLIN >=32 RESISTANT Resistant     CEFAZOLIN <=4 SENSITIVE Sensitive     CEFEPIME <=0.12 SENSITIVE Sensitive     CEFTAZIDIME <=1 SENSITIVE Sensitive     CEFTRIAXONE <=0.25 SENSITIVE Sensitive     CIPROFLOXACIN <=0.25 SENSITIVE Sensitive     GENTAMICIN >=16 RESISTANT Resistant     IMIPENEM <=0.25 SENSITIVE Sensitive     TRIMETH/SULFA >=320 RESISTANT Resistant     AMPICILLIN/SULBACTAM >=32 RESISTANT Resistant     PIP/TAZO <=4 SENSITIVE Sensitive     * ESCHERICHIA COLI  Culture, blood (Routine X 2) w Reflex to ID Panel     Status: None   Collection Time: 11/30/20  4:53 AM   Specimen: BLOOD RIGHT ARM  Result Value Ref Range Status   Specimen Description BLOOD RIGHT ARM  Final   Special Requests   Final    BOTTLES DRAWN AEROBIC AND ANAEROBIC Blood Culture results may not be optimal due to an excessive volume of blood received in culture bottles   Culture   Final     NO GROWTH 5 DAYS Performed at [facility], [address] [place], [place] [address]    Report Status 12/05/2020 FINAL  Final  Blood Culture ID Panel (Reflexed)     Status: Abnormal   Collection Time: 11/30/20  4:53 AM  Result Value Ref Range Status   Enterococcus faecalis NOT DETECTED NOT DETECTED Final   Enterococcus Faecium NOT DETECTED NOT DETECTED Final   Listeria monocytogenes NOT DETECTED NOT DETECTED Final   Staphylococcus species NOT DETECTED NOT DETECTED Final   Staphylococcus aureus (BCID) NOT DETECTED NOT DETECTED Final   Staphylococcus epidermidis NOT DETECTED NOT DETECTED Final   Staphylococcus lugdunensis NOT DETECTED NOT DETECTED Final   Streptococcus species NOT DETECTED NOT DETECTED Final   Streptococcus agalactiae NOT DETECTED NOT DETECTED Final   Streptococcus pneumoniae NOT DETECTED NOT DETECTED Final   Streptococcus pyogenes NOT DETECTED NOT DETECTED Final   A.calcoaceticus-baumannii NOT DETECTED NOT DETECTED Final   Bacteroides fragilis NOT DETECTED NOT DETECTED Final   Enterobacterales DETECTED (A) NOT DETECTED Final    Comment: Enterobacterales represent a large order of gram negative bacteria, not a single organism. CRITICAL RESULT CALLED TO, READ BACK BY AND VERIFIED WITH: S,HURTH PHARMD @1216  12/01/20 [place]    Enterobacter cloacae complex NOT DETECTED NOT DETECTED Final   Escherichia coli DETECTED (A) NOT DETECTED Final    Comment: CRITICAL RESULT CALLED TO, READ BACK BY AND VERIFIED WITH: S,HURTH PHARMD @1216  12/01/20 [place]    Klebsiella aerogenes NOT DETECTED NOT DETECTED Final   Klebsiella  oxytoca NOT DETECTED NOT DETECTED Final   Klebsiella pneumoniae NOT DETECTED NOT DETECTED Final   Proteus species NOT DETECTED NOT DETECTED Final   Salmonella species NOT DETECTED NOT DETECTED Final   Serratia marcescens NOT DETECTED NOT DETECTED Final   Haemophilus influenzae NOT DETECTED NOT DETECTED Final   Neisseria meningitidis NOT DETECTED NOT DETECTED Final    Pseudomonas aeruginosa NOT DETECTED NOT DETECTED Final   Stenotrophomonas maltophilia NOT DETECTED NOT DETECTED Final   Candida albicans NOT DETECTED NOT DETECTED Final   Candida auris NOT DETECTED NOT DETECTED Final   Candida glabrata NOT DETECTED NOT DETECTED Final   Candida krusei NOT DETECTED NOT DETECTED Final   Candida parapsilosis NOT DETECTED NOT DETECTED Final   Candida tropicalis NOT DETECTED NOT DETECTED Final   Cryptococcus neoformans/gattii NOT DETECTED NOT DETECTED Final   CTX-M ESBL NOT DETECTED NOT DETECTED Final   Carbapenem resistance IMP NOT DETECTED NOT DETECTED Final   Carbapenem resistance KPC NOT DETECTED NOT DETECTED Final   Carbapenem resistance NDM NOT DETECTED NOT DETECTED Final   Carbapenem resist OXA 48 LIKE NOT DETECTED NOT DETECTED Final   Carbapenem resistance VIM NOT DETECTED NOT DETECTED Final    Comment: Performed at [facility], [address] [place], [place] [address]  Culture, blood (Routine X 2) w Reflex to ID Panel     Status: Abnormal   Collection Time: 12/01/20 12:46 PM   Specimen: BLOOD  Result Value Ref Range Status   Specimen Description   Final    BLOOD DRAWN BY DIALYSIS DRAWN BY RN Performed at [facility], [address] [place], [place] [address]    Special Requests   Final    BOTTLES DRAWN AEROBIC AND ANAEROBIC Blood Culture adequate volume Performed at [facility], [address], [place], [place] [address]    Culture  Setup Time   Final    GRAM POSITIVE COCCI both anaerobic and aerobic bottles Gram Stain Report Called to,Read Back By and Verified With: Rhew,J@0545  by Matthews,B 10.22.22 Performed at [facility], [address], [place], [place] [address]    Culture (A)  Final    STAPHYLOCOCCUS AUREUS SUSCEPTIBILITIES PERFORMED ON PREVIOUS CULTURE WITHIN THE LAST 5 DAYS. Performed at [facility], [place] [address], [place], [place] [address]    Report Status 12/04/2020 FINAL  Final  Culture, blood (Routine X  2) w Reflex to ID Panel     Status: Abnormal   Collection Time: 12/01/20 12:55 PM   Specimen: BLOOD  Result Value Ref Range Status   Specimen Description   Final    BLOOD DRAWN BY DIALYSIS DRAWN BY RN Performed at [facility], [address], [place], [place] [address]    Special Requests   Final    BOTTLES DRAWN AEROBIC AND ANAEROBIC Blood Culture adequate volume Performed at [facility], [address] [place], [place] [address]    Culture  Setup Time   Final    GRAM POSITIVE COCCI ANAEROBIC BOTTLE ONLY Gram Stain Report Called to,Read Back By and Verified With: HUMPHRIES @ 1600 ON 102222 BY HENDERSON L CRITICAL VALUE NOTED.  VALUE IS CONSISTENT WITH PREVIOUSLY REPORTED AND CALLED VALUE.    Culture (A)  Final    STAPHYLOCOCCUS AUREUS SUSCEPTIBILITIES PERFORMED ON PREVIOUS CULTURE WITHIN THE LAST 5 DAYS. Performed at [facility], [place] [address], [place],  [address]    Report Status 12/04/2020 FINAL  Final  Culture, blood (routine x 2)  Status: Abnormal   Collection Time: 12/03/20  1:03 PM   Specimen: BLOOD RIGHT HAND  Result Value Ref Range Status   Specimen Description   Final    BLOOD RIGHT HAND BOTTLES DRAWN AEROBIC AND ANAEROBIC Performed at [facility], [address] [place], [place] [address]    Special Requests   Final    Blood Culture adequate volume Performed at [facility], [address] [place], [place] [address]    Culture  Setup Time   Final    GRAM NEGATIVE RODS IN BOTH AEROBIC AND ANAEROBIC BOTTLES Gram Stain Report Called to,Read Back By and Verified With: [email or web address]  by [email or web address], b 10.24.22 CRITICAL VALUE NOTED.  VALUE IS CONSISTENT WITH PREVIOUSLY REPORTED AND CALLED VALUE. Performed at [facility], [address], [place], [place] [address]    Culture (A)  Final    ESCHERICHIA COLI SUSCEPTIBILITIES PERFORMED ON PREVIOUS CULTURE WITHIN THE LAST 5 DAYS. Performed at [facility], [place] [address] [place], [place]  [address]    Report Status 12/05/2020 FINAL  Final  Culture, blood (routine x 2)     Status: Abnormal (Preliminary result)   Collection Time: 12/03/20  1:09 PM   Specimen: Right Antecubital; Blood  Result Value Ref Range Status   Specimen Description   Final    RIGHT ANTECUBITAL BOTTLES DRAWN AEROBIC AND ANAEROBIC Performed at [facility], [address] [place], [place] [address]    Special Requests   Final    Blood Culture adequate volume Performed at [facility], [address], [place], [place] [address]    Culture  Setup Time   Final    GRAM POSITIVE COCCI ANAEROBIC BOTTLE ONLY GRAM NEGATIVE RODS AEROBIC BOTTLE ONLY Gram Stain Report Called to,Read Back By and Verified With: BOTH ORGANISMS, MORRIS,C AT 0940 ON 10.24.22 BY RUCINSKI,B CRITICAL VALUE NOTED.  VALUE IS CONSISTENT WITH PREVIOUSLY REPORTED AND CALLED VALUE.    Culture (A)  Final    STAPHYLOCOCCUS AUREUS ESCHERICHIA COLI SUSCEPTIBILITIES PERFORMED ON PREVIOUS CULTURE WITHIN THE LAST 5 DAYS. Performed at [facility], [place] [address] [place], [place] [address]    Report Status PENDING  Incomplete  Culture, blood (Routine X 2) w Reflex to ID Panel     Status: None (Preliminary result)   Collection Time: 12/05/20  5:29 AM   Specimen: BLOOD RIGHT HAND  Result Value Ref Range Status   Specimen Description BLOOD RIGHT HAND  Final   Special Requests   Final    BOTTLES DRAWN AEROBIC ONLY Blood Culture results may not be optimal due to an inadequate volume of blood received in culture bottles   Culture   Final    NO GROWTH < 12 HOURS Performed at [facility], [address] [place], [place] [address]    Report Status PENDING  Incomplete  Culture, blood (Routine X 2) w Reflex to ID Panel     Status: None (Preliminary result)   Collection Time: 12/05/20  5:29 AM   Specimen: BLOOD RIGHT ARM  Result Value Ref Range Status   Specimen Description BLOOD RIGHT ARM  Final   Special Requests   Final    BOTTLES DRAWN  AEROBIC AND ANAEROBIC Blood Culture results may not be optimal due to an excessive volume of blood received in culture bottles   Culture   Final    NO GROWTH < 12 HOURS Performed at [facility], [address], [place], [place] [address]    Report Status PENDING  Incomplete     Scheduled Meds:  calcitRIOL  0.5 mcg Oral Q M,W,F   calcium acetate  1,334 mg Oral TID with meals   Chlorhexidine Gluconate Cloth  6 each Topical Q0600   loratadine  10 mg Oral q1800   mouth rinse  15 mL Mouth Rinse BID   mupirocin ointment   Nasal BID   pantoprazole  40 mg Oral Daily   silver sulfADIAZINE   Topical BID   Continuous Infusions:  [START ON 12/06/2020]  ceFAZolin (ANCEF) IV     heparin 2,650 Units/hr (12/05/20 1156)   vancomycin 1,000 mg (12/04/20 1150)    Procedures/Studies: DG Elbow 2 Views Right  Result Date: 11/29/2020 CLINICAL DATA:  Pain and swelling in the right elbow lung recent fall, initial encounter EXAM: RIGHT ELBOW - 2 VIEW COMPARISON:  None. FINDINGS: Mild degenerative changes of the right elbow joint are seen. No acute fracture or dislocation is noted. Small joint effusion is noted anteriorly. Olecranon spurring is seen. IMPRESSION: Small joint effusion without acute fracture. Electronically Signed   By: Inez Catalina M.D.   On: 11/29/2020 18:58   MR LUMBAR SPINE WO CONTRAST  Result Date: 12/05/2020 CLINICAL DATA:  Low back pain, infection suspected low back pain. Recurrent MRSA bacteremia EXAM: MRI LUMBAR SPINE WITHOUT CONTRAST TECHNIQUE: Multiplanar, multisequence MR imaging of the lumbar spine was performed. No intravenous contrast was administered. COMPARISON:  None. FINDINGS: Motion artifact is present. Segmentation: Standard. Alignment:  No significant listhesis. Vertebrae: Mildly decreased T1 marrow signal may reflect hematopoietic marrow. No substantial marrow edema. No suspicious osseous lesion Conus medullaris and cauda equina: Conus extends to the L1 level. Conus and  cauda equina appear normal. Paraspinal and other soft tissues: Unremarkable. Disc levels: L1-L2:  No canal or foraminal stenosis. L2-L3: Disc bulge. Facet arthropathy with ligamentum flavum infolding. No canal stenosis. Mild foraminal stenosis. L3-L4: Level was not included on axial imaging. On sagittal imaging, there is a disc bulge and central annular fissure as well as facet arthropathy. Likely at least mild canal stenosis. Mild to moderate right and mild left foraminal stenosis. L4-L5: Disc bulge. Marked facet arthropathy with ligamentum flavum infolding and joint effusions with extra-spinal synovial cyst formation. Marked canal stenosis with effacement of subarticular recesses. Mild to moderate foraminal stenosis. L5-S1: Disc bulge with small central annular fissure. No canal or foraminal stenosis. Signal within the right lateral epidural space may reflect traversing vessels. IMPRESSION: Degraded by motion.  No evidence of discitis/osteomyelitis. Multilevel degenerative changes as detailed above. There is marked canal stenosis at L4-L5 with effacement of subarticular recesses. Facet arthropathy is also greatest at this level. The L3-L4 level was not included on axial imaging due to technical error. There is likely at least mild canal stenosis, mild to moderate right foraminal stenosis, and mild left foraminal stenosis. Facet arthropathy is present at this level. If desired, patient can be brought back for repeat scanning without charge. Electronically Signed   By: Macy Mis M.D.   On: 12/05/2020 17:26   US Abdomen Complete  Result Date: 12/04/2020 CLINICAL DATA:  Elevated LFT EXAM: ABDOMEN ULTRASOUND COMPLETE COMPARISON:  Renal ultrasound 04/18/2017 FINDINGS: Gallbladder: No shadowing stones. Small fluid around the gallbladder. Negative sonographic Murphy. Increased wall thickness of 5.2 mm. Common bile duct: Diameter: 4.8 mm Liver: No focal lesion identified. Within normal limits in parenchymal  echogenicity. Portal vein is patent on color Doppler imaging with normal direction of blood flow towards the liver. IVC: No abnormality visualized. Pancreas: Obscured by bowel  gas Spleen: Slightly enlarged with volume of 526 mL. Right Kidney: Length: 12.3 cm.  Lower pole cyst measuring 2.2 cm. Left Kidney: Length: 13.3 cm. Echogenicity within normal limits. No mass or hydronephrosis visualized. Abdominal aorta: No aneurysm visualized. Other findings: Incidental bilateral pleural effusions. Small amount of ascites within the abdomen. IMPRESSION: 1. Negative for gallstones. Slight increased gallbladder wall thickness but negative sonographic Percell Miller, findings are nonspecific. There is a small amount of fluid adjacent to the gallbladder likely related to generalized small ascites in the right upper quadrant. 2. Liver grossly within normal limits. 3. Mild splenomegaly 4. Small amount of ascites within the upper and lower abdomen. 5. Bilateral pleural effusions Electronically Signed   By: Donavan Foil M.D.   On: 12/04/2020 16:00   MR FOOT RIGHT WO CONTRAST  Result Date: 12/01/2020 CLINICAL DATA:  Right foot ulcer. EXAM: MRI OF THE RIGHT FOREFOOT WITHOUT CONTRAST TECHNIQUE: Multiplanar, multisequence MR imaging of the right forefoot was performed. No intravenous contrast was administered. COMPARISON:  Right foot x-rays dated November 27, 2020. FINDINGS: Bones/Joint/Cartilage Faint marrow edema the fifth metatarsal head. Remaining marrow signal is normal. No fracture or dislocation. Mild second TMT joint osteoarthritis. No joint effusion. Ligaments Collateral ligaments are intact.  Lisfranc ligament is intact. Muscles and Tendons Flexor and extensor tendons are intact. Increased T2 signal within the intrinsic muscles of the forefoot, nonspecific, but likely related to diabetic muscle changes. Soft tissue Diffuse soft tissue swelling. Soft tissue ulceration along the lateral forefoot near the fifth metatarsal head with  underlying 2.1 x 0.4 x 2.3 cm curvilinear fluid collection (series 8, image 26). No soft tissue mass. IMPRESSION: 1. Soft tissue ulceration along the lateral forefoot near the fifth metatarsal head with underlying 2.3 cm curvilinear fluid collection, concerning for abscess. 2. Faint marrow edema in the fifth metatarsal head is nonspecific, but concerning for early osteomyelitis given adjacent abscess and soft tissue ulcer. Electronically Signed   By: Titus Dubin M.D.   On: 12/01/2020 14:16   DG Chest Port 1 View  Result Date: 11/29/2020 CLINICAL DATA:  Removal of Port-A-Cath EXAM: PORTABLE CHEST 1 VIEW COMPARISON:  11/27/2020 FINDINGS: There is been interval removal of hemodialysis catheter on the right. No visible retained fragments. Prior CABG. Cardiomegaly. Vascular congestion. Bilateral layering effusions and lower lobe airspace opacity. Probable mild edema/CHF. IMPRESSION: Interval removal of right hemodialysis catheter. CHF.  Layering effusions. Electronically Signed   By: Rolm Baptise M.D.   On: 11/29/2020 16:58   DG Chest Port 1 View  Result Date: 11/27/2020 CLINICAL DATA:  Fall.  Chest and right foot pain. EXAM: PORTABLE CHEST 1 VIEW COMPARISON:  09/01/2020 FINDINGS: Unchanged mild cardiomegaly. Unchanged mild pulmonary vascular congestion. Small bilateral pleural effusions are not significantly changed since prior examination. Interval placement of dialysis catheter. Median sternotomy changes again noted. IMPRESSION: No acute abnormality of the chest. Unchanged findings of mild CHF/fluid volume overload. Electronically Signed   By: Miachel Roux M.D.   On: 11/27/2020 11:24   DG Foot 2 Views Right  Result Date: 11/27/2020 CLINICAL DATA:  wound to lateral fore foot EXAM: RIGHT FOOT - 2 VIEW COMPARISON:  Radiograph 08/18/2020 FINDINGS: There is no evidence of acute fracture or dislocation. Unchanged bulky ossification along the base of the fifth metatarsal, possibly from prior trauma. There  is a lateral forefoot soft tissue defect. There is no frank bony destruction radiographically. Mild midfoot and forefoot degenerative changes. Plantar and dorsal calcaneal spurring. Vascular calcifications. IMPRESSION: Lateral forefoot soft tissue defect. No  frank bony destruction radiographically to suggest osteomyelitis. MRI would be more sensitive. Electronically Signed   By: Maurine Simmering M.D.   On: 11/27/2020 11:32   ECHO TEE  Result Date: 12/05/2020    TRANSESOPHOGEAL ECHO REPORT   Patient Name:   LIEV BROCKBANK [facility] Date of Exam: 12/05/2020 Medical Rec #:  [id number]           Height:       74.0 in Accession #:    [id number]          Weight:       252.6 lb Date of Birth:  07-03-1950           BSA:          2.400 m Patient Age:    45 years            BP:           132/59 mmHg Patient Gender: M                   HR:           78 bpm. Exam Location:  [facility] Procedure: Transesophageal Echo, Color Doppler and Cardiac Doppler Indications:    Bacteremia R78.81  History:        Patient has prior history of Echocardiogram examinations, most                 recent 11/28/2020. CAD, Prior CABG, Arrythmias:Atrial                 Fibrillation; Risk Factors:Hypertension, Diabetes and                 Dyslipidemia. ESRD (end stage renal disease).  Sonographer:    Alvino Chapel RCS Referring Phys: [id number] PAULA V ROSS PROCEDURE: The transesophogeal probe was passed without difficulty through the esophogus of the patient. Sedation performed by different physician. The patient developed no complications during the procedure. IMPRESSIONS  1. Left ventricular ejection fraction, by estimation, is 50 to 55%. The left ventricle has low normal function.  2. Right ventricular systolic function is low normal. The right ventricular size is moderately enlarged.  3. Left atrial size was severely dilated. No left atrial/left atrial appendage thrombus was detected. The LAA emptying velocity was 65 cm/s.  4. Right atrial size was severely  dilated.  5. The mitral valve is normal in structure. Mild mitral valve regurgitation. No evidence of mitral stenosis.  6. The tricuspid valve is abnormal. Tricuspid valve regurgitation is moderate.  7. The aortic valve is tricuspid. There is mild calcification of the aortic valve. Aortic valve regurgitation is not visualized. No aortic stenosis is present. Conclusion(s)/Recommendation(s): No evidence of vegetation/infective endocarditis on this transesophageal echocardiogram. FINDINGS  Left Ventricle: Left ventricular ejection fraction, by estimation, is 50 to 55%. The left ventricle has low normal function. The left ventricular internal cavity size was normal in size. Right Ventricle: The right ventricular size is moderately enlarged. Right vetricular wall thickness was not assessed. Right ventricular systolic function is low normal. Left Atrium: Left atrial size was severely dilated. No left atrial/left atrial appendage thrombus was detected. The LAA emptying velocity was 65 cm/s. Right Atrium: Right atrial size was severely dilated. Pericardium: There is no evidence of pericardial effusion. Mitral Valve: The mitral valve is normal in structure. Mild mitral valve regurgitation. No evidence of mitral valve stenosis. Tricuspid Valve: The tricuspid valve is abnormal. Tricuspid valve regurgitation is moderate . No evidence of tricuspid stenosis. Aortic  Valve: The aortic valve is tricuspid. There is mild calcification of the aortic valve. Aortic valve regurgitation is not visualized. No aortic stenosis is present. Pulmonic Valve: The pulmonic valve was not well visualized. Pulmonic valve regurgitation is trivial. No evidence of pulmonic stenosis. Aorta: The aortic root is normal in size and structure. There is minimal (Grade I) plaque. IAS/Shunts: No atrial level shunt detected by color flow Doppler. Carlyle Dolly MD Electronically signed by Carlyle Dolly MD Signature Date/Time: 12/05/2020/4:24:16 PM    Final     ECHOCARDIOGRAM LIMITED  Result Date: 11/28/2020    ECHOCARDIOGRAM LIMITED REPORT   Patient Name:   ROBERTS BON [facility] Date of Exam: 11/28/2020 Medical Rec #:  [id number]           Height:       74.0 in Accession #:    [id number]          Weight:       261.9 lb Date of Birth:  [birth year]           BSA:          2.437 m Patient Age:    30 years            BP:           124/56 mmHg Patient Gender: M                   HR:           72 bpm. Exam Location:  [facility] Procedure: Limited Echo, Color Doppler and Cardiac Doppler Indications:    Dyspnea R06.00  History:        Patient has prior history of Echocardiogram examinations, most                 recent 06/06/2020. CAD, Prior CABG, Arrythmias:Atrial                 Fibrillation, Signs/Symptoms:Shortness of Breath; Risk                 Factors:Hypertension, Diabetes and Dyslipidemia.  Sonographer:    Alvino Chapel RCS Referring Phys: [id number] [place]  1. Left ventricular ejection fraction, by estimation, is 45 to 50%. The left ventricle has mildly decreased function. The left ventricle demonstrates regional wall motion abnormalities (see scoring diagram/findings for description). There is severe asymmetric left ventricular hypertrophy of the basal and septal segments. Left ventricular diastolic parameters are indeterminate. There is the interventricular septum is flattened in systole and diastole, consistent with right ventricular pressure and volume overload.  2. Right ventricular systolic function is low normal. The right ventricular size is mildly enlarged. There is moderately elevated pulmonary artery systolic pressure. The estimated right ventricular systolic pressure is 67.8 mmHg.  3. Left atrial size was mildly dilated.  4. Right atrial size was mildly dilated.  5. Left pleural effusion evident. A small pericardial effusion is present. The pericardial effusion is posterior to the left ventricle.  6. The mitral valve is abnormal.  Mild mitral valve regurgitation.  7. The aortic valve is tricuspid. There is moderate calcification of the aortic valve. Aortic valve regurgitation is not visualized.  8. The inferior vena cava is dilated in size with <50% respiratory variability, suggesting right atrial pressure of 15 mmHg. Comparison(s): Prior images reviewed side by side. Reduced LVEF and wall motion abnormalities are not new based on my review of the prior study. FINDINGS  Left Ventricle: Left ventricular ejection fraction, by estimation, is 45  to 50%. The left ventricle has mildly decreased function. The left ventricle demonstrates regional wall motion abnormalities. Definity contrast agent was given IV to delineate the left ventricular endocardial borders. There is severe asymmetric left ventricular hypertrophy of the basal and septal segments. The interventricular septum is flattened in systole and diastole, consistent with right ventricular pressure and volume overload. Left ventricular diastolic parameters are indeterminate. Left ventricular diastolic function could not be evaluated due to atrial fibrillation.  LV Wall Scoring: The basal inferior segment is akinetic. The posterior wall, mid inferoseptal segment, mid inferior segment, and basal inferoseptal segment are hypokinetic. The entire anterior wall, antero-lateral wall, entire anterior septum, and entire apex are normal. Right Ventricle: The right ventricular size is mildly enlarged. No increase in right ventricular wall thickness. Right ventricular systolic function is low normal. There is moderately elevated pulmonary artery systolic pressure. The tricuspid regurgitant  velocity is 3.12 m/s, and with an assumed right atrial pressure of 15 mmHg, the estimated right ventricular systolic pressure is 56.4 mmHg. Left Atrium: Left atrial size was mildly dilated. Right Atrium: Right atrial size was mildly dilated. Pericardium: Left pleural effusion evident. A small pericardial effusion is  present. The pericardial effusion is posterior to the left ventricle. Mitral Valve: The mitral valve is abnormal. There is mild thickening of the mitral valve leaflet(s). There is mild calcification of the mitral valve leaflet(s). Mild mitral valve regurgitation. Tricuspid Valve: The tricuspid valve is grossly normal. Tricuspid valve regurgitation is mild. Aortic Valve: The aortic valve is tricuspid. There is moderate calcification of the aortic valve. There is mild to moderate aortic valve annular calcification. Aortic valve regurgitation is not visualized. Aorta: The aortic root is normal in size and structure. Venous: The inferior vena cava is dilated in size with less than 50% respiratory variability, suggesting right atrial pressure of 15 mmHg. IAS/Shunts: No atrial level shunt detected by color flow Doppler. LEFT VENTRICLE PLAX 2D LVIDd:         5.50 cm LVIDs:         4.30 cm LV PW:         1.30 cm LV IVS:        1.80 cm LVOT diam:     2.30 cm LVOT Area:     4.15 cm  RIGHT VENTRICLE TAPSE (M-mode): 1.7 cm LEFT ATRIUM              Index        RIGHT ATRIUM           Index LA diam:        4.20 cm  1.72 cm/m   RA Area:     27.30 cm LA Vol (A2C):   102.0 ml 41.85 ml/m  RA Volume:   96.50 ml  39.59 ml/m LA Vol (A4C):   74.5 ml  30.57 ml/m LA Biplane Vol: 91.8 ml  37.66 ml/m   AORTA Ao Root diam: 3.80 cm MITRAL VALVE                TRICUSPID VALVE MV Area (PHT): 5.02 cm     TR Peak grad:   38.9 mmHg MV Decel Time: 151 msec     TR Vmax:        312.00 cm/s MV E velocity: 143.00 cm/s                             SHUNTS  Systemic Diam: 2.30 cm Rozann Lesches MD Electronically signed by Rozann Lesches MD Signature Date/Time: 11/28/2020/1:03:27 PM    Final     Orson Eva, DO  [facility]  If 7PM-7AM, please contact night-coverage [email or web address] Password [email or web address] 12/05/2020, 6:29 PM   LOS: 8 days

## 2020-12-05 NOTE — Progress Notes (Signed)
ANTICOAGULATION CONSULT NOTE  Pharmacy Consult for Heparin Indication: hx DVT/afib  Allergies  Allergen Reactions   Latex Hives and Rash   Lipitor [Atorvastatin]     Leg aches     Patient Measurements: Height: 6\' 2"  (188 cm) Weight: 114.6 kg (252 lb 10.4 oz) IBW/kg (Calculated) : 82.2 HEPARIN DW (KG): 107.6   Vital Signs: Temp: 97.6 F (36.4 C) (10/24 1439) Temp Source: Oral (10/24 1439) BP: 138/69 (10/24 2024) Pulse Rate: 83 (10/24 2024)  Labs: Recent Labs    12/02/20 0435 12/03/20 0200 12/03/20 0447 12/03/20 1039 12/03/20 1904 12/04/20 0341 12/04/20 0947 12/04/20 1411 12/05/20 0019  HGB 9.8*  --  9.4*  --   --  9.7*  --   --   --   HCT 32.9*  --  31.7*  --   --  31.5*  --   --   --   PLT 178  --  207  --   --  214  --   --   --   APTT  --    < > 45* 40*   < >  --  48* 30 44*  HEPARINUNFRC  --    < > >1.10* 0.99*  --   --  0.57  --   --   CREATININE  --   --   --   --   --  7.55*  --   --   --    < > = values in this interval not displayed.     Estimated Creatinine Clearance: 12.4 mL/min (A) (by C-G formula based on SCr of 7.55 mg/dL (H)).   Assessment: 70 y.o. male with h/o DVT and Afib, Eliquis on hold, for heparin.  Goal of Therapy:  APTT 66-102 sec Heparin level 0.3-0.7 units/ml Monitor platelets by anticoagulation protocol: Yes   Plan:  Increase Heparin 2650 units/hr aPTT in 8 hours  Phillis Knack, PharmD, BCPS

## 2020-12-05 NOTE — Progress Notes (Signed)
*  PRELIMINARY RESULTS* Echocardiogram Echocardiogram Transesophageal has been performed.  Travis Blair 12/05/2020, 4:12 PM

## 2020-12-06 ENCOUNTER — Telehealth: Payer: Self-pay | Admitting: *Deleted

## 2020-12-06 DIAGNOSIS — J9601 Acute respiratory failure with hypoxia: Secondary | ICD-10-CM

## 2020-12-06 DIAGNOSIS — I5030 Unspecified diastolic (congestive) heart failure: Secondary | ICD-10-CM

## 2020-12-06 LAB — CULTURE, BLOOD (ROUTINE X 2): Special Requests: ADEQUATE

## 2020-12-06 LAB — HEPARIN LEVEL (UNFRACTIONATED)
Heparin Unfractionated: 0.46 IU/mL (ref 0.30–0.70)
Heparin Unfractionated: 0.39 IU/mL (ref 0.30–0.70)

## 2020-12-06 LAB — RENAL FUNCTION PANEL
Albumin: 2.4 g/dL — ABNORMAL LOW (ref 3.5–5.0)
Anion gap: 11 (ref 5–15)
BUN: 86 mg/dL — ABNORMAL HIGH (ref 8–23)
CO2: 25 mmol/L (ref 22–32)
Calcium: 8.3 mg/dL — ABNORMAL LOW (ref 8.9–10.3)
Chloride: 91 mmol/L — ABNORMAL LOW (ref 98–111)
Creatinine, Ser: 7.38 mg/dL — ABNORMAL HIGH (ref 0.61–1.24)
GFR, Estimated: 7 mL/min — ABNORMAL LOW (ref >60)
Glucose, Bld: 244 mg/dL — ABNORMAL HIGH (ref 70–99)
Phosphorus: 5.7 mg/dL — ABNORMAL HIGH (ref 2.5–4.6)
Potassium: 4.1 mmol/L (ref 3.5–5.1)
Sodium: 127 mmol/L — ABNORMAL LOW (ref 135–145)

## 2020-12-06 LAB — CBC
HCT: 31 % — ABNORMAL LOW (ref 39.0–52.0)
Hemoglobin: 9.4 g/dL — ABNORMAL LOW (ref 13.0–17.0)
MCH: 27.3 pg (ref 26.0–34.0)
MCHC: 30.3 g/dL (ref 30.0–36.0)
MCV: 90.1 fL (ref 80.0–100.0)
Platelets: 255 10*3/uL (ref 150–400)
RBC: 3.44 MIL/uL — ABNORMAL LOW (ref 4.22–5.81)
RDW: 18.8 % — ABNORMAL HIGH (ref 11.5–15.5)
WBC: 15.6 10*3/uL — ABNORMAL HIGH (ref 4.0–10.5)
nRBC: 0 % (ref 0.0–0.2)

## 2020-12-06 LAB — APTT: aPTT: 92 seconds — ABNORMAL HIGH (ref 24–36)

## 2020-12-06 LAB — VANCOMYCIN, TROUGH: Vancomycin Tr: 12 ug/mL — ABNORMAL LOW (ref 15–20)

## 2020-12-06 MED ORDER — VANCOMYCIN HCL 1250 MG/250ML IV SOLN
1250.0000 mg | INTRAVENOUS | Status: DC
  Administered 2020-12-06 – 2020-12-08 (×2): 1250 mg via INTRAVENOUS
  Filled 2020-12-06 (×2): qty 250

## 2020-12-06 MED ORDER — PROPOFOL 1000 MG/100ML IV EMUL
INTRAVENOUS | Status: AC
  Filled 2020-12-06: qty 100

## 2020-12-06 MED ORDER — VANCOMYCIN HCL 10 G IV SOLR: 1250.0000 mg | INTRAVENOUS | Status: DC

## 2020-12-06 MED ORDER — SPOT INK MARKER SYRINGE KIT
PACK | SUBMUCOSAL | Status: AC
  Filled 2020-12-06: qty 5

## 2020-12-06 MED ORDER — HEPARIN SODIUM (PORCINE) 1000 UNIT/ML DIALYSIS: 20.0000 [IU]/kg | INTRAMUSCULAR | Status: DC | PRN

## 2020-12-06 NOTE — Progress Notes (Signed)
ANTICOAGULATION CONSULT NOTE  Pharmacy Consult for Heparin Indication: hx DVT/afib  Allergies  Allergen Reactions   Latex Hives and Rash   Lipitor [Atorvastatin]     Leg aches     Patient Measurements: Height: 6\' 2"  (188 cm) Weight: 114.6 kg (252 lb 10.4 oz) IBW/kg (Calculated) : 82.2 HEPARIN DW (KG): 107.6   Vital Signs: Temp: 98 F (36.7 C) (10/26 0553) Temp Source: Oral (10/25 2122) BP: 130/70 (10/26 0553) Pulse Rate: 75 (10/26 0553)  Labs: Recent Labs    12/04/20 0341 12/04/20 0947 12/04/20 1411 12/05/20 0757 12/05/20 1958 12/06/20 0558  HGB 9.7*  --   --  10.5*  --  9.4*  HCT 31.5*  --   --  34.0*  --  31.0*  PLT 214  --   --  226  --  255  APTT  --  48*   < > 73* 50* 92*  HEPARINUNFRC  --  0.57  --  0.51  --  0.46  CREATININE 7.55*  --   --  6.28*  --   --    < > = values in this interval not displayed.     Estimated Creatinine Clearance: 14.9 mL/min (A) (by C-G formula based on SCr of 6.28 mg/dL (H)).   Assessment: 70 y.o. male on apixaban PTA for h/o DVT/PE and Afib, now being held. Last LE doppler July 2022 was negative. Pharmacy consulted for heparin.  APTT 92 HL 0.46 Both therapeutic- will monitor just on heparin levels now    Goal of Therapy:   Heparin level 0.3-0.7 units/ml Monitor platelets by anticoagulation protocol: Yes   Plan:  Continue heparin infusion at 2800 units/hr Heparin level in 8 hours and daily Monitor for signs/symptoms of bleeding  F/u restart apixaban   Margot Ables, PharmD Clinical Pharmacist 12/06/2020 8:13 AM

## 2020-12-06 NOTE — Progress Notes (Signed)
Pharmacy Antibiotic Note  Travis Blair is a 69 y.o. male admitted on 11/27/2020 for heart failure, now with bacteremia.  Pharmacy has been consulted for vancomycin and cefazolin dosing.  Plan: Vanco trough 12 Increase to vancomycin 1250 mg every MWF w/ HD. Goal pre-HD level 15-25 mcg/mL. Cefazolin 2000 mg IV every MWF w/ HD. Monitor labs, c/s, and vanco levels as indicated.  Height: 6\' 2"  (188 cm) Weight: 114.6 kg (252 lb 10.4 oz) IBW/kg (Calculated) : 82.2  Temp (24hrs), Avg:97.8 F (36.6 C), Min:97.5 F (36.4 C), Max:98 F (36.7 C)  Recent Labs  Lab 11/30/20 0453 12/01/20 0538 12/02/20 0435 12/03/20 0447 12/04/20 0341 12/05/20 0757 12/06/20 0558  WBC 14.0* 14.1* 11.3* 12.0* 13.9* 16.0* 15.6*  CREATININE 5.11* 6.35*  --   --  7.55* 6.28*  --   VANCOTROUGH  --   --   --   --   --   --  12*     Estimated Creatinine Clearance: 14.9 mL/min (A) (by C-G formula based on SCr of 6.28 mg/dL (H)).    Allergies  Allergen Reactions   Latex Hives and Rash   Lipitor [Atorvastatin]     Leg aches      Vanco 10/19 >> Cefepime 10/21 >>10/25 Cefazolin 10/26 >>   10/25 BCX: ngtd 10/23 Bcx: Staph Aureus and E. COLI, same sensitivities  10/21BCX: MRSA 10/20 Bcx: E.Coli S- cefazolin , cefepime, ceftriaxone   R- amp, unasyn, gent 10/18 Bcx: MRSA and strep species   Thank you for allowing pharmacy to be a part of this patient's care.  Margot Ables, PharmD Clinical Pharmacist 12/06/2020 9:00 AM

## 2020-12-06 NOTE — Telephone Encounter (Signed)
Received forms from patient spouse for travel insurance reimbursement.   Patient had scheduled vacation on 11/26/2020, but was not feeling well at that time and was subsequently admitted to the hospital on 11/27/2020.  Forms routed to provider.

## 2020-12-06 NOTE — Progress Notes (Signed)
[facility]  Wound continues to drain. Wounds probed and cleansed with saline on qtip, need to clean out the tunnel towards the dorsum of the foot from lateral wound with each dressing change. Silvadene packed into tunnel after cleaning and around the wound. Erythema the same. All superficial drainage.    Will change tomorrow and reassess if further bedside debridement needed. MRI with no definitive osteo at this time but will need to monitor closely.       Curlene Labrum, MD [facility] [address] [place], [place] [id number] [phone] (office)

## 2020-12-06 NOTE — Procedures (Signed)
   HEMODIALYSIS TREATMENT NOTE:   4 hour dialysis treatment completed via left upper arm AVF which tolerated Qb 250 cc/min with stable albeit high venous pressures (190-200 mmHg).    Goal met: 2.6 liters removed without interruption in UF.    Vancomycin and Cefazolin were given at the end of treatment.  All blood was returned and hemostasis was achieved in 15 minutes.  No changes from pre-HD assessment.  Rockwell Alexandria, RN

## 2020-12-06 NOTE — Progress Notes (Signed)
PROGRESS NOTE  Travis Blair NLZ:[id number] DOB: [birth year] DOA: 11/27/2020 PCP: [name], MD  Brief History:   70 y.o. male with medical history significant of CHF, ERSD on HD (M-W-F), CAD, DVT/PE, and T2DM, CABG with post operative atrial fibrillation, pulmonary hypertension who presented with dyspnea.  Patient reported having cough for about 2 weeks, persistent and worsening.  Tried several antitussive agents at home without improvement of symptoms.  He went to dialysis on Friday, oct 14. He e was noted to be dyspneic by his wife, moderate in intensity, associated with cough.  He has had a chronic cough for at least one year, worse over last week prior to [place].  He took some robitussin and codeine from an old prescription on 11/26/20.  On Monday, he was more unsteady and fell in the BR.  He had subjective f/c at home since 11/24/20.  He denied n/v/d, abd pain.     His main symptom seems to be increased work of breathing/dyspnea, moderate to severe in intensity, worse with exertion, associated with cough, no chest pain, no improving factors.  Last night not able to sleep due to persistent cough, dry.  He continues to make urine 3 times daily.     In June 2022 he decided to stop apixaban due to weakness, he was placed on clopidogrel.  Last year he had COVID-pneumonia and pulm embolism.  About 3 months ago he was started on hemodialysis, he has a fistula which is not mature yet.  He uses a tunneled dialysis catheter for dialysis.  Since admission, nephrology was consulted to assist.  He was dialyzed on 10/17 and 10/19.  He was found to have MRSA bacteremia.  General surgery was consulted to remove his Permacath.  He was started on IV vancomycin 11/29/20.  Repeat blood culture on 10/20 showed Ecoli, and he was started on cefepime.   Assessment/Plan: Sepsis - RESOLVED -present on admission -had fever and leukocytosis -due to MRSA bacteremia -sepsis physiology resolved    MRSA bacteremia/EColi Bacteremia -removed tunneled HD catheter 10/19 after HD -10/18 blood culture = MRSA (peripheral) -10/20 blood culture = E Coli (peripheral) -12/01/20 blood culture = S aureus (on HD via fistula) -12/03/20 blood culture = GNR (peripheral) -12/05/20 Blood culture = neg -11/28/20 TTE--no obvious vegetation -able to to use fistula for HD 10/21 -12/05/20 TEE--no vegetation -12/05/20 MR lumbar>>no abscess or osteomyelitis/discitis -will need to delay permacath placement until neg blood culture -12/05/20--discussed with ID, Dr. Henreitta Cea current abx for now - per ID recommendation, continue IV abx for 6 weeks    EColi Bacteremia -continue cefepime on HD>>plan to switch to cefazolin on HD based on susceptibilities -10/20 blood culture = EColi--no MRSA -10/23 blood culture= EColi + MRSA -10/25 Blood culture  =neg   Right Diabetic foot ulcerAcute Osteomyelitis 5th metatarsal -spouse states wound is improving with care at home -MR right foot--Soft tissue ulceration along the lateral forefoot near the fifth metatarsal head with underlying 2.3 cm curvilinear fluid collection,concerning for abscess -consult wound care -discussed with surgery--foot debrided 10/22>>no pus -10/24 wider foot debridement with small amount purulence -appreciate general surgery, Dr. Constance Haw -further bedside evaluation 10/27 for need for more debridement    Acute respiratory failure with hypoxia -due to pulmonary edema/pleural effusions -presented with oxygen saturation 87% on RA and tachypnea -stable on 3L -wean off oxygen for saturation >92%   Acute on chronic diastolic CHF/Acute systolic CHF -10/14/38 Echo 60-65%, no WMA; RV  systolic function preserved.  RSVP 57.3.  No significant valvular disease. -fluid removal on HD -11/28/20 Echo EF 45-50%+WMA, RV overload, no vegetation -stable on 2L   Dysphagia -12/02/20 EGD--no esophageal abnormality to explain dysphagia; dilated; mild  portal hypertensive gastropathy -suspect he has esophageal dysmotility from his medical issues -swallowing better since dilatation   ESRD on hemodialysis  -appreciate renal consult -HD on 10/17, 10/19, 10/21, 10/24 -10/21, 10/24--able to use fistula   History DVT/PE -hold apixaban temporarily in preparation for new catheter insertion -restart apixaban if no further procedures   Chronic Afib -AC as discussed above -continue IV heparin for now managed by pharm D   Uncontrolled Diabetes mellitus type 2 with hyperglycemia -08/18/20 A1C--8.0 -CBGs controlled so far -novolog sliding scale -10/19- A1C 6.3 -CBGs diet-controlled   Right Elbow pain -due to recent fall on 11/27/20 -xray--no fracture; small anterior joint eff -improved--no further synovitis   Transaminasemia -likely due to sepsis and hemodynamic changes -trending down -f/u abd US--neg gallstones, slight GB thickening without murphy's; mild splenomegaly -repeat LFTs in am     Status is: Inpatient   Remains inpatient appropriate because: severity of illness    Family Communication:   daughter updated 10/25   Consultants:  renal, general surgery, GI, ID   Code Status:  FULL   DVT Prophylaxis:  IV Heparin      Procedures: As Listed in Progress Note Above   Antibiotics: Vanco 10/19>> Cefepime 10/21>>10/25 Cefazolin 10/26>>   Subjective: Patient reports feeling better today and no specific complaints.    Objective: Vitals:   12/06/20 1615 12/06/20 1630 12/06/20 1700 12/06/20 1730  BP: 130/65 128/62 133/70 120/66  Pulse: 76 74 72 77  Resp:      Temp:      TempSrc:      SpO2:      Weight:      Height:        Intake/Output Summary (Last 24 hours) at 12/06/2020 1742 Last data filed at 12/06/2020 1726 Gross per 24 hour  Intake 1528.69 ml  Output --  Net 1528.69 ml   Weight change:  Exam:  General:  Pt is alert, follows commands appropriately, not in acute distress HEENT: No icterus, No  thrush, No neck mass, [place]/AT Cardiovascular: RRR, S1/S2, no rubs, no gallops Respiratory: bibasilar rales.  No wheeze Abdomen: Soft/+BS, non tender, non distended, no guarding Extremities: No edema, No lymphangitis, No petechiae, No rashes, no synovitis   Data Reviewed: I have personally reviewed following labs and imaging studies Basic Metabolic Panel: Recent Labs  Lab 11/30/20 0453 12/01/20 0538 12/04/20 0341 12/05/20 0757 12/06/20 1406  NA 133* 130* 129* 129* 127*  K 3.9 4.3 4.2 4.0 4.1  CL 97* 93* 91* 92* 91*  CO2 25 24 23 26 25   GLUCOSE 120* 162* 283* 237* 244*  BUN 49* 69* 97* 71* 86*  CREATININE 5.11* 6.35* 7.55* 6.28* 7.38*  CALCIUM 8.0* 8.4* 8.1* 8.0* 8.3*  PHOS 3.2 3.9  --   --  5.7*   Liver Function Tests: Recent Labs  Lab 12/01/20 0538 12/02/20 0435 12/04/20 0341 12/05/20 0757 12/06/20 1406  AST  --  347* 138* 88*  --   ALT  --  130* 180* 141*  --   ALKPHOS  --  174* 194* 184*  --   BILITOT  --  1.6* 0.8 0.8  --   PROT  --  6.9 6.0* 6.5  --   ALBUMIN 2.5* 3.1* 2.6* 2.6* 2.4*   No results  for input(s): LIPASE, AMYLASE in the last 168 hours. No results for input(s): AMMONIA in the last 168 hours. Coagulation Profile: No results for input(s): INR, PROTIME in the last 168 hours. CBC: Recent Labs  Lab 12/02/20 0435 12/03/20 0447 12/04/20 0341 12/05/20 0757 12/06/20 0558  WBC 11.3* 12.0* 13.9* 16.0* 15.6*  HGB 9.8* 9.4* 9.7* 10.5* 9.4*  HCT 32.9* 31.7* 31.5* 34.0* 31.0*  MCV 91.4 91.1 89.0 89.5 90.1  PLT 178 207 214 226 255   Cardiac Enzymes: No results for input(s): CKTOTAL, CKMB, CKMBINDEX, TROPONINI in the last 168 hours. BNP: Invalid input(s): POCBNP CBG: Recent Labs  Lab 11/29/20 2009 12/02/20 1419 12/02/20 1544  GLUCAP 130* 137* 135*   HbA1C: No results for input(s): HGBA1C in the last 72 hours. Urine analysis:    Component Value Date/Time   COLORURINE YELLOW 03/10/2017 1553   APPEARANCEUR HAZY (A) 03/10/2017 1553   LABSPEC  1.013 03/10/2017 1553   PHURINE 5.0 03/10/2017 1553   GLUCOSEU NEGATIVE 03/10/2017 1553   HGBUR NEGATIVE 03/10/2017 1553   BILIRUBINUR NEGATIVE 03/10/2017 1553   KETONESUR NEGATIVE 03/10/2017 1553   PROTEINUR 30 (A) 03/10/2017 1553   NITRITE NEGATIVE 03/10/2017 1553   LEUKOCYTESUR NEGATIVE 03/10/2017 1553   Recent Results (from the past 240 hour(s))  Resp Panel by RT-PCR (Flu A&B, Covid) Nasopharyngeal Swab     Status: None   Collection Time: 11/27/20 10:09 AM   Specimen: Nasopharyngeal Swab; Nasopharyngeal(NP) swabs in vial transport medium  Result Value Ref Range Status   SARS Coronavirus 2 by RT PCR NEGATIVE NEGATIVE Final    Comment: (NOTE) SARS-CoV-2 target nucleic acids are NOT DETECTED.  The SARS-CoV-2 RNA is generally detectable in upper respiratory specimens during the acute phase of infection. The lowest concentration of SARS-CoV-2 viral copies this assay can detect is 138 copies/mL. A negative result does not preclude SARS-Cov-2 infection and should not be used as the sole basis for treatment or other patient management decisions. A negative result may occur with  improper specimen collection/handling, submission of specimen other than nasopharyngeal swab, presence of viral mutation(s) within the areas targeted by this assay, and inadequate number of viral copies(<138 copies/mL). A negative result must be combined with clinical observations, patient history, and epidemiological information. The expected result is Negative.  Fact Sheet for Patients:  EntrepreneurPulse.com.au  Fact Sheet for Healthcare Providers:  IncredibleEmployment.be  This test is no t yet approved or cleared by the [place] FDA and  has been authorized for detection and/or diagnosis of SARS-CoV-2 by FDA under an Emergency Use Authorization (EUA). This EUA will remain  in effect (meaning this test can be used) for the duration of the COVID-19 declaration  under Section 564(b)(1) of the Act, 21 U.S.C.section 360bbb-3(b)(1), unless the authorization is terminated  or revoked sooner.       Influenza A by PCR NEGATIVE NEGATIVE Final   Influenza B by PCR NEGATIVE NEGATIVE Final    Comment: (NOTE) The Xpert Xpress SARS-CoV-2/FLU/RSV plus assay is intended as an aid in the diagnosis of influenza from Nasopharyngeal swab specimens and should not be used as a sole basis for treatment. Nasal washings and aspirates are unacceptable for Xpert Xpress SARS-CoV-2/FLU/RSV testing.  Fact Sheet for Patients: EntrepreneurPulse.com.au  Fact Sheet for Healthcare Providers: IncredibleEmployment.be  This test is not yet approved or cleared by the [place] FDA and has been authorized for detection and/or diagnosis of SARS-CoV-2 by FDA under an Emergency Use Authorization (EUA). This EUA will remain in effect (meaning this  test can be used) for the duration of the COVID-19 declaration under Section 564(b)(1) of the Act, 21 U.S.C. section 360bbb-3(b)(1), unless the authorization is terminated or revoked.  Performed at [facility], [address] [place], [place] [address]   MRSA Next Gen by PCR, Nasal     Status: Abnormal   Collection Time: 11/27/20  4:10 PM   Specimen: Nasal Mucosa; Nasal Swab  Result Value Ref Range Status   MRSA by PCR Next Gen DETECTED (A) NOT DETECTED Final    Comment: RESULT CALLED TO, READ BACK BY AND VERIFIED WITH: HEATHER TETREAULT,1951,11/27/2020,SELF S. (NOTE) The GeneXpert MRSA Assay (FDA approved for NASAL specimens only), is one component of a comprehensive MRSA colonization surveillance program. It is not intended to diagnose MRSA infection nor to guide or monitor treatment for MRSA infections. Test performance is not FDA approved in patients less than 85 years old. Performed at [facility], [address], [place], [place] [address]   Culture, blood (Routine X 2) w Reflex to  ID Panel     Status: Abnormal   Collection Time: 11/28/20  9:34 AM   Specimen: BLOOD RIGHT HAND  Result Value Ref Range Status   Specimen Description   Final    BLOOD RIGHT HAND Performed at [facility], [address] [place], [place] [address]    Special Requests   Final    Immunocompromised Blood Culture adequate volume BOTTLES DRAWN AEROBIC AND ANAEROBIC Performed at [facility], [address], [place], [place] [address]    Culture  Setup Time   Final    GRAM POSITIVE COCCI IN BOTH AEROBIC AND ANAEROBIC BOTTLES Gram Stain Report Called to,Read Back By and Verified WithLynwood Dawley, 2216, 11/28/2020,SELF S aerobic bottle previously called 10.18.22 2353 matthews, b  Performed at [facility], [address] [place], [place] [address]    Culture (A)  Final    STAPHYLOCOCCUS AUREUS SUSCEPTIBILITIES PERFORMED ON PREVIOUS CULTURE WITHIN THE LAST 5 DAYS. Performed at [facility], [place] [address] [place], [place] [address]    Report Status 12/02/2020 FINAL  Final  Culture, blood (Routine X 2) w Reflex to ID Panel     Status: Abnormal   Collection Time: 11/28/20  9:34 AM   Specimen: BLOOD RIGHT FOREARM  Result Value Ref Range Status   Specimen Description   Final    BLOOD RIGHT FOREARM Performed at [facility], [address], [place], [place] [address]    Special Requests   Final    Immunocompromised Blood Culture adequate volume BOTTLES DRAWN AEROBIC AND ANAEROBIC Performed at [facility], [address], [place], [place] [address]    Culture  Setup Time   Final    GRAM POSITIVE COCCI IN BOTH AEROBIC AND ANAEROBIC BOTTLES Gram Stain Report Called to,Read Back By and Verified With: T.ALLEN,2236,11/28/2020,SELF S aerobic bottle gpc previously called matthews, b 10.18.22 2355 CRITICAL RESULT CALLED TO, READ BACK BY AND VERIFIED WITH: T ALLEN,RN@0209  11/29/20 [place]    Culture (A)  Final    METHICILLIN RESISTANT STAPHYLOCOCCUS AUREUS VIRIDANS STREPTOCOCCUS THE  SIGNIFICANCE OF ISOLATING THIS ORGANISM FROM A SINGLE SET OF BLOOD CULTURES WHEN MULTIPLE SETS ARE DRAWN IS UNCERTAIN. PLEASE NOTIFY THE MICROBIOLOGY DEPARTMENT WITHIN ONE WEEK IF SPECIATION AND SENSITIVITIES ARE REQUIRED. Performed at [facility], [place] [address] [place], [place] [address]    Report Status 12/02/2020 FINAL  Final   Organism ID, Bacteria METHICILLIN RESISTANT STAPHYLOCOCCUS AUREUS  Final  Susceptibility   Methicillin resistant staphylococcus aureus - MIC*    CIPROFLOXACIN >=8 RESISTANT Resistant     ERYTHROMYCIN >=8 RESISTANT Resistant     GENTAMICIN <=0.5 SENSITIVE Sensitive     OXACILLIN >=4 RESISTANT Resistant     TETRACYCLINE <=1 SENSITIVE Sensitive     VANCOMYCIN 1 SENSITIVE Sensitive     TRIMETH/SULFA <=10 SENSITIVE Sensitive     CLINDAMYCIN >=8 RESISTANT Resistant     RIFAMPIN <=0.5 SENSITIVE Sensitive     Inducible Clindamycin NEGATIVE Sensitive     * METHICILLIN RESISTANT STAPHYLOCOCCUS AUREUS  Blood Culture ID Panel (Reflexed)     Status: Abnormal   Collection Time: 11/28/20  9:34 AM  Result Value Ref Range Status   Enterococcus faecalis NOT DETECTED NOT DETECTED Final   Enterococcus Faecium NOT DETECTED NOT DETECTED Final   Listeria monocytogenes NOT DETECTED NOT DETECTED Final   Staphylococcus species DETECTED (A) NOT DETECTED Final    Comment: CRITICAL RESULT CALLED TO, READ BACK BY AND VERIFIED WITH: T ALLEN,RN@0209  11/29/20 [place]    Staphylococcus aureus (BCID) DETECTED (A) NOT DETECTED Final    Comment: Methicillin (oxacillin)-resistant Staphylococcus aureus (MRSA). MRSA is predictably resistant to beta-lactam antibiotics (except ceftaroline). Preferred therapy is vancomycin unless clinically contraindicated. Patient requires contact precautions if  hospitalized. CRITICAL RESULT CALLED TO, READ BACK BY AND VERIFIED WITH: T ALLEN,RN@0209  11/29/20 [place]    Staphylococcus epidermidis NOT DETECTED NOT DETECTED Final   Staphylococcus lugdunensis NOT  DETECTED NOT DETECTED Final   Streptococcus species DETECTED (A) NOT DETECTED Final    Comment: Not Enterococcus species, Streptococcus agalactiae, Streptococcus pyogenes, or Streptococcus pneumoniae. CRITICAL RESULT CALLED TO, READ BACK BY AND VERIFIED WITH: T ALLEN,RN@ 0209 11/29/20 [place]    Streptococcus agalactiae NOT DETECTED NOT DETECTED Final   Streptococcus pneumoniae NOT DETECTED NOT DETECTED Final   Streptococcus pyogenes NOT DETECTED NOT DETECTED Final   A.calcoaceticus-baumannii NOT DETECTED NOT DETECTED Final   Bacteroides fragilis NOT DETECTED NOT DETECTED Final   Enterobacterales NOT DETECTED NOT DETECTED Final   Enterobacter cloacae complex NOT DETECTED NOT DETECTED Final   Escherichia coli NOT DETECTED NOT DETECTED Final   Klebsiella aerogenes NOT DETECTED NOT DETECTED Final   Klebsiella oxytoca NOT DETECTED NOT DETECTED Final   Klebsiella pneumoniae NOT DETECTED NOT DETECTED Final   Proteus species NOT DETECTED NOT DETECTED Final   Salmonella species NOT DETECTED NOT DETECTED Final   Serratia marcescens NOT DETECTED NOT DETECTED Final   Haemophilus influenzae NOT DETECTED NOT DETECTED Final   Neisseria meningitidis NOT DETECTED NOT DETECTED Final   Pseudomonas aeruginosa NOT DETECTED NOT DETECTED Final   Stenotrophomonas maltophilia NOT DETECTED NOT DETECTED Final   Candida albicans NOT DETECTED NOT DETECTED Final   Candida auris NOT DETECTED NOT DETECTED Final   Candida glabrata NOT DETECTED NOT DETECTED Final   Candida krusei NOT DETECTED NOT DETECTED Final   Candida parapsilosis NOT DETECTED NOT DETECTED Final   Candida tropicalis NOT DETECTED NOT DETECTED Final   Cryptococcus neoformans/gattii NOT DETECTED NOT DETECTED Final   Meth resistant mecA/C and MREJ DETECTED (A) NOT DETECTED Final    Comment: CRITICAL RESULT CALLED TO, READ BACK BY AND VERIFIED WITH: T ALLEN,RN@0209  11/29/20 [place] Performed at [facility], [address] [place], [place] [address]    Culture, blood (Routine X 2) w Reflex to ID Panel     Status: Abnormal   Collection Time: 11/30/20  4:53 AM   Specimen: BLOOD RIGHT HAND  Result Value Ref Range Status  Specimen Description   Final    BLOOD RIGHT HAND Performed at [facility], [address], [place], [place] [address]    Special Requests   Final    BOTTLES DRAWN AEROBIC AND ANAEROBIC Blood Culture adequate volume Performed at [facility], [address], [place], [place] [address]    Culture  Setup Time   Final    GRAM NEGATIVE RODS AEROBIC BOTTLE ONLY Gram Stain Report Called to,Read Back By and Verified With: EVANS T.,2003,11/30/2020,SELF S CRITICAL RESULT CALLED TO, READ BACK BY AND VERIFIED WITH: S,HURTH PHARMD @1216  12/01/20 EB Performed at [facility], [address], [place], [place] [address]    Culture ESCHERICHIA COLI (A)  Final   Report Status 12/02/2020 FINAL  Final   Organism ID, Bacteria ESCHERICHIA COLI  Final      Susceptibility   Escherichia coli - MIC*    AMPICILLIN >=32 RESISTANT Resistant     CEFAZOLIN <=4 SENSITIVE Sensitive     CEFEPIME <=0.12 SENSITIVE Sensitive     CEFTAZIDIME <=1 SENSITIVE Sensitive     CEFTRIAXONE <=0.25 SENSITIVE Sensitive     CIPROFLOXACIN <=0.25 SENSITIVE Sensitive     GENTAMICIN >=16 RESISTANT Resistant     IMIPENEM <=0.25 SENSITIVE Sensitive     TRIMETH/SULFA >=320 RESISTANT Resistant     AMPICILLIN/SULBACTAM >=32 RESISTANT Resistant     PIP/TAZO <=4 SENSITIVE Sensitive     * ESCHERICHIA COLI  Culture, blood (Routine X 2) w Reflex to ID Panel     Status: None   Collection Time: 11/30/20  4:53 AM   Specimen: BLOOD RIGHT ARM  Result Value Ref Range Status   Specimen Description BLOOD RIGHT ARM  Final   Special Requests   Final    BOTTLES DRAWN AEROBIC AND ANAEROBIC Blood Culture results may not be optimal due to an excessive volume of blood received in culture bottles   Culture   Final    NO GROWTH 5 DAYS Performed at [facility], [address] [place], [place] [address]    Report Status 12/05/2020 FINAL  Final  Blood Culture ID Panel (Reflexed)     Status: Abnormal   Collection Time: 11/30/20  4:53 AM  Result Value Ref Range Status   Enterococcus faecalis NOT DETECTED NOT DETECTED Final   Enterococcus Faecium NOT DETECTED NOT DETECTED Final   Listeria monocytogenes NOT DETECTED NOT DETECTED Final   Staphylococcus species NOT DETECTED NOT DETECTED Final   Staphylococcus aureus (BCID) NOT DETECTED NOT DETECTED Final   Staphylococcus epidermidis NOT DETECTED NOT DETECTED Final   Staphylococcus lugdunensis NOT DETECTED NOT DETECTED Final   Streptococcus species NOT DETECTED NOT DETECTED Final   Streptococcus agalactiae NOT DETECTED NOT DETECTED Final   Streptococcus pneumoniae NOT DETECTED NOT DETECTED Final   Streptococcus pyogenes NOT DETECTED NOT DETECTED Final   A.calcoaceticus-baumannii NOT DETECTED NOT DETECTED Final   Bacteroides fragilis NOT DETECTED NOT DETECTED Final   Enterobacterales DETECTED (A) NOT DETECTED Final    Comment: Enterobacterales represent a large order of gram negative bacteria, not a single organism. CRITICAL RESULT CALLED TO, READ BACK BY AND VERIFIED WITH: S,HURTH PHARMD @1216  12/01/20 [place]    Enterobacter cloacae complex NOT DETECTED NOT DETECTED Final   Escherichia coli DETECTED (A) NOT DETECTED Final    Comment: CRITICAL RESULT CALLED TO, READ BACK BY AND VERIFIED WITH: S,HURTH PHARMD @1216  12/01/20 [place]    Klebsiella aerogenes NOT DETECTED NOT DETECTED Final   Klebsiella oxytoca NOT DETECTED NOT DETECTED Final  Klebsiella pneumoniae NOT DETECTED NOT DETECTED Final   Proteus species NOT DETECTED NOT DETECTED Final   Salmonella species NOT DETECTED NOT DETECTED Final   Serratia marcescens NOT DETECTED NOT DETECTED Final   Haemophilus influenzae NOT DETECTED NOT DETECTED Final   Neisseria meningitidis NOT DETECTED NOT DETECTED Final   Pseudomonas aeruginosa NOT DETECTED NOT DETECTED Final    Stenotrophomonas maltophilia NOT DETECTED NOT DETECTED Final   Candida albicans NOT DETECTED NOT DETECTED Final   Candida auris NOT DETECTED NOT DETECTED Final   Candida glabrata NOT DETECTED NOT DETECTED Final   Candida krusei NOT DETECTED NOT DETECTED Final   Candida parapsilosis NOT DETECTED NOT DETECTED Final   Candida tropicalis NOT DETECTED NOT DETECTED Final   Cryptococcus neoformans/gattii NOT DETECTED NOT DETECTED Final   CTX-M ESBL NOT DETECTED NOT DETECTED Final   Carbapenem resistance IMP NOT DETECTED NOT DETECTED Final   Carbapenem resistance KPC NOT DETECTED NOT DETECTED Final   Carbapenem resistance NDM NOT DETECTED NOT DETECTED Final   Carbapenem resist OXA 48 LIKE NOT DETECTED NOT DETECTED Final   Carbapenem resistance VIM NOT DETECTED NOT DETECTED Final    Comment: Performed at [facility], [address] [place], [place] [address]  Culture, blood (Routine X 2) w Reflex to ID Panel     Status: Abnormal   Collection Time: 12/01/20 12:46 PM   Specimen: BLOOD  Result Value Ref Range Status   Specimen Description   Final    BLOOD DRAWN BY DIALYSIS DRAWN BY RN Performed at [facility], [address] [place], [place] [address]    Special Requests   Final    BOTTLES DRAWN AEROBIC AND ANAEROBIC Blood Culture adequate volume Performed at [facility], [address], [place], [place] [address]    Culture  Setup Time   Final    GRAM POSITIVE COCCI both anaerobic and aerobic bottles Gram Stain Report Called to,Read Back By and Verified With: Rhew,J@0545  by Matthews,B 10.22.22 Performed at [facility], [address] [place], [place] [address]    Culture (A)  Final    STAPHYLOCOCCUS AUREUS SUSCEPTIBILITIES PERFORMED ON PREVIOUS CULTURE WITHIN THE LAST 5 DAYS. Performed at [facility], [place] [address] [place], [place] [address]    Report Status 12/04/2020 FINAL  Final  Culture, blood (Routine X 2) w Reflex to ID Panel     Status: Abnormal   Collection  Time: 12/01/20 12:55 PM   Specimen: BLOOD  Result Value Ref Range Status   Specimen Description   Final    BLOOD DRAWN BY DIALYSIS DRAWN BY RN Performed at [facility], [address] [place], [place] [address]    Special Requests   Final    BOTTLES DRAWN AEROBIC AND ANAEROBIC Blood Culture adequate volume Performed at [facility], [address], [place], [place] [address]    Culture  Setup Time   Final    GRAM POSITIVE COCCI ANAEROBIC BOTTLE ONLY Gram Stain Report Called to,Read Back By and Verified With: HUMPHRIES @ 1600 ON 102222 BY HENDERSON L CRITICAL VALUE NOTED.  VALUE IS CONSISTENT WITH PREVIOUSLY REPORTED AND CALLED VALUE.    Culture (A)  Final    STAPHYLOCOCCUS AUREUS SUSCEPTIBILITIES PERFORMED ON PREVIOUS CULTURE WITHIN THE LAST 5 DAYS. Performed at [facility], [place] [address], [place],  [address]    Report Status 12/04/2020 FINAL  Final  Culture, blood (routine x 2)     Status: Abnormal   Collection Time: 12/03/20  1:03 PM   Specimen: BLOOD RIGHT HAND  Result Value Ref Range Status   Specimen Description   Final    BLOOD RIGHT HAND BOTTLES DRAWN AEROBIC AND ANAEROBIC Performed at [facility], [address], [place], [place] [address]    Special Requests   Final    Blood Culture adequate volume Performed at [facility], [address], [place], [place] [address]    Culture  Setup Time   Final    GRAM NEGATIVE RODS IN BOTH AEROBIC AND ANAEROBIC BOTTLES Gram Stain Report Called to,Read Back By and Verified With: Adalberto [email or web address]  by [email or web address], b 10.24.22 CRITICAL VALUE NOTED.  VALUE IS CONSISTENT WITH PREVIOUSLY REPORTED AND CALLED VALUE. Performed at [facility], [address], [place], [place] [address]    Culture (A)  Final    ESCHERICHIA COLI SUSCEPTIBILITIES PERFORMED ON PREVIOUS CULTURE WITHIN THE LAST 5 DAYS. Performed at [facility], [place] [address] [place], [place] [address]    Report Status 12/05/2020 FINAL  Final  Culture, blood  (routine x 2)     Status: Abnormal   Collection Time: 12/03/20  1:09 PM   Specimen: Right Antecubital; Blood  Result Value Ref Range Status   Specimen Description   Final    RIGHT ANTECUBITAL BOTTLES DRAWN AEROBIC AND ANAEROBIC Performed at [facility], [address], [place], [place] [address]    Special Requests   Final    Blood Culture adequate volume Performed at [facility], [address] [place], [place] [address]    Culture  Setup Time   Final    GRAM POSITIVE COCCI ANAEROBIC BOTTLE ONLY GRAM NEGATIVE RODS AEROBIC BOTTLE ONLY Gram Stain Report Called to,Read Back By and Verified With: BOTH ORGANISMS, MORRIS,C AT 0940 ON 10.24.22 BY RUCINSKI,B CRITICAL VALUE NOTED.  VALUE IS CONSISTENT WITH PREVIOUSLY REPORTED AND CALLED VALUE.    Culture (A)  Final    STAPHYLOCOCCUS AUREUS ESCHERICHIA COLI SUSCEPTIBILITIES PERFORMED ON PREVIOUS CULTURE WITHIN THE LAST 5 DAYS. Performed at [facility], [place] [address] [place], [place] [address]    Report Status 12/06/2020 FINAL  Final  Culture, blood (Routine X 2) w Reflex to ID Panel     Status: None (Preliminary result)   Collection Time: 12/05/20  5:29 AM   Specimen: BLOOD RIGHT HAND  Result Value Ref Range Status   Specimen Description BLOOD RIGHT HAND  Final   Special Requests   Final    BOTTLES DRAWN AEROBIC ONLY Blood Culture results may not be optimal due to an inadequate volume of blood received in culture bottles   Culture   Final    NO GROWTH 1 DAY Performed at [facility], [address], [place], [place] [address]    Report Status PENDING  Incomplete  Culture, blood (Routine X 2) w Reflex to ID Panel     Status: None (Preliminary result)   Collection Time: 12/05/20  5:29 AM   Specimen: BLOOD RIGHT ARM  Result Value Ref Range Status   Specimen Description BLOOD RIGHT ARM  Final   Special Requests   Final    BOTTLES DRAWN AEROBIC AND ANAEROBIC Blood Culture results may not be optimal due to an excessive volume of  blood received in culture bottles   Culture   Final    NO GROWTH 1 DAY Performed at [facility], [address] [place], [place] [address]    Report Status PENDING  Incomplete     Scheduled Meds:  calcitRIOL  0.5  mcg Oral Q M,W,F   calcium acetate  1,334 mg Oral TID with meals   Chlorhexidine Gluconate Cloth  6 each Topical Q0600   loratadine  10 mg Oral q1800   mouth rinse  15 mL Mouth Rinse BID   mupirocin ointment   Nasal BID   pantoprazole  40 mg Oral Daily   silver sulfADIAZINE   Topical BID   Continuous Infusions:   ceFAZolin (ANCEF) IV 2 g (12/06/20 1730)   heparin 2,800 Units/hr (12/06/20 1726)   vancomycin 1,250 mg (12/06/20 1640)    Procedures/Studies: DG Elbow 2 Views Right  Result Date: 11/29/2020 CLINICAL DATA:  Pain and swelling in the right elbow lung recent fall, initial encounter EXAM: RIGHT ELBOW - 2 VIEW COMPARISON:  None. FINDINGS: Mild degenerative changes of the right elbow joint are seen. No acute fracture or dislocation is noted. Small joint effusion is noted anteriorly. Olecranon spurring is seen. IMPRESSION: Small joint effusion without acute fracture. Electronically Signed   By: Inez Catalina M.D.   On: 11/29/2020 18:58   MR LUMBAR SPINE WO CONTRAST  Result Date: 12/05/2020 CLINICAL DATA:  Low back pain, infection suspected low back pain. Recurrent MRSA bacteremia EXAM: MRI LUMBAR SPINE WITHOUT CONTRAST TECHNIQUE: Multiplanar, multisequence MR imaging of the lumbar spine was performed. No intravenous contrast was administered. COMPARISON:  None. FINDINGS: Motion artifact is present. Segmentation: Standard. Alignment:  No significant listhesis. Vertebrae: Mildly decreased T1 marrow signal may reflect hematopoietic marrow. No substantial marrow edema. No suspicious osseous lesion Conus medullaris and cauda equina: Conus extends to the L1 level. Conus and cauda equina appear normal. Paraspinal and other soft tissues: Unremarkable. Disc levels: L1-L2:  No canal  or foraminal stenosis. L2-L3: Disc bulge. Facet arthropathy with ligamentum flavum infolding. No canal stenosis. Mild foraminal stenosis. L3-L4: Level was not included on axial imaging. On sagittal imaging, there is a disc bulge and central annular fissure as well as facet arthropathy. Likely at least mild canal stenosis. Mild to moderate right and mild left foraminal stenosis. L4-L5: Disc bulge. Marked facet arthropathy with ligamentum flavum infolding and joint effusions with extra-spinal synovial cyst formation. Marked canal stenosis with effacement of subarticular recesses. Mild to moderate foraminal stenosis. L5-S1: Disc bulge with small central annular fissure. No canal or foraminal stenosis. Signal within the right lateral epidural space may reflect traversing vessels. IMPRESSION: Degraded by motion.  No evidence of discitis/osteomyelitis. Multilevel degenerative changes as detailed above. There is marked canal stenosis at L4-L5 with effacement of subarticular recesses. Facet arthropathy is also greatest at this level. The L3-L4 level was not included on axial imaging due to technical error. There is likely at least mild canal stenosis, mild to moderate right foraminal stenosis, and mild left foraminal stenosis. Facet arthropathy is present at this level. If desired, patient can be brought back for repeat scanning without charge. Electronically Signed   By: Macy Mis M.D.   On: 12/05/2020 17:26   US Abdomen Complete  Result Date: 12/04/2020 CLINICAL DATA:  Elevated LFT EXAM: ABDOMEN ULTRASOUND COMPLETE COMPARISON:  Renal ultrasound 04/18/2017 FINDINGS: Gallbladder: No shadowing stones. Small fluid around the gallbladder. Negative sonographic Murphy. Increased wall thickness of 5.2 mm. Common bile duct: Diameter: 4.8 mm Liver: No focal lesion identified. Within normal limits in parenchymal echogenicity. Portal vein is patent on color Doppler imaging with normal direction of blood flow towards the  liver. IVC: No abnormality visualized. Pancreas: Obscured by bowel gas Spleen: Slightly enlarged with volume of 526 mL. Right Kidney: Length:  12.3 cm.  Lower pole cyst measuring 2.2 cm. Left Kidney: Length: 13.3 cm. Echogenicity within normal limits. No mass or hydronephrosis visualized. Abdominal aorta: No aneurysm visualized. Other findings: Incidental bilateral pleural effusions. Small amount of ascites within the abdomen. IMPRESSION: 1. Negative for gallstones. Slight increased gallbladder wall thickness but negative sonographic Percell Miller, findings are nonspecific. There is a small amount of fluid adjacent to the gallbladder likely related to generalized small ascites in the right upper quadrant. 2. Liver grossly within normal limits. 3. Mild splenomegaly 4. Small amount of ascites within the upper and lower abdomen. 5. Bilateral pleural effusions Electronically Signed   By: Donavan Foil M.D.   On: 12/04/2020 16:00   MR FOOT RIGHT WO CONTRAST  Result Date: 12/01/2020 CLINICAL DATA:  Right foot ulcer. EXAM: MRI OF THE RIGHT FOREFOOT WITHOUT CONTRAST TECHNIQUE: Multiplanar, multisequence MR imaging of the right forefoot was performed. No intravenous contrast was administered. COMPARISON:  Right foot x-rays dated November 27, 2020. FINDINGS: Bones/Joint/Cartilage Faint marrow edema the fifth metatarsal head. Remaining marrow signal is normal. No fracture or dislocation. Mild second TMT joint osteoarthritis. No joint effusion. Ligaments Collateral ligaments are intact.  Lisfranc ligament is intact. Muscles and Tendons Flexor and extensor tendons are intact. Increased T2 signal within the intrinsic muscles of the forefoot, nonspecific, but likely related to diabetic muscle changes. Soft tissue Diffuse soft tissue swelling. Soft tissue ulceration along the lateral forefoot near the fifth metatarsal head with underlying 2.1 x 0.4 x 2.3 cm curvilinear fluid collection (series 8, image 26). No soft tissue mass.  IMPRESSION: 1. Soft tissue ulceration along the lateral forefoot near the fifth metatarsal head with underlying 2.3 cm curvilinear fluid collection, concerning for abscess. 2. Faint marrow edema in the fifth metatarsal head is nonspecific, but concerning for early osteomyelitis given adjacent abscess and soft tissue ulcer. Electronically Signed   By: Titus Dubin M.D.   On: 12/01/2020 14:16   DG Chest Port 1 View  Result Date: 11/29/2020 CLINICAL DATA:  Removal of Port-A-Cath EXAM: PORTABLE CHEST 1 VIEW COMPARISON:  11/27/2020 FINDINGS: There is been interval removal of hemodialysis catheter on the right. No visible retained fragments. Prior CABG. Cardiomegaly. Vascular congestion. Bilateral layering effusions and lower lobe airspace opacity. Probable mild edema/CHF. IMPRESSION: Interval removal of right hemodialysis catheter. CHF.  Layering effusions. Electronically Signed   By: Rolm Baptise M.D.   On: 11/29/2020 16:58   DG Chest Port 1 View  Result Date: 11/27/2020 CLINICAL DATA:  Fall.  Chest and right foot pain. EXAM: PORTABLE CHEST 1 VIEW COMPARISON:  09/01/2020 FINDINGS: Unchanged mild cardiomegaly. Unchanged mild pulmonary vascular congestion. Small bilateral pleural effusions are not significantly changed since prior examination. Interval placement of dialysis catheter. Median sternotomy changes again noted. IMPRESSION: No acute abnormality of the chest. Unchanged findings of mild CHF/fluid volume overload. Electronically Signed   By: Miachel Roux M.D.   On: 11/27/2020 11:24   DG Foot 2 Views Right  Result Date: 11/27/2020 CLINICAL DATA:  wound to lateral fore foot EXAM: RIGHT FOOT - 2 VIEW COMPARISON:  Radiograph 08/18/2020 FINDINGS: There is no evidence of acute fracture or dislocation. Unchanged bulky ossification along the base of the fifth metatarsal, possibly from prior trauma. There is a lateral forefoot soft tissue defect. There is no frank bony destruction radiographically. Mild  midfoot and forefoot degenerative changes. Plantar and dorsal calcaneal spurring. Vascular calcifications. IMPRESSION: Lateral forefoot soft tissue defect. No frank bony destruction radiographically to suggest osteomyelitis. MRI would be more sensitive.  Electronically Signed   By: Maurine Simmering M.D.   On: 11/27/2020 11:32   ECHO TEE  Result Date: 12/05/2020    TRANSESOPHOGEAL ECHO REPORT   Patient Name:   Travis Blair [facility] [place] [facility] Date of Exam: 12/05/2020 Medical Rec #:  [id number]           Height:       74.0 in Accession #:    [id number]          Weight:       252.6 lb Date of Birth:  1950-10-03           BSA:          2.400 m Patient Age:    21 years            BP:           132/59 mmHg Patient Gender: M                   HR:           78 bpm. Exam Location:  [facility] Procedure: Transesophageal Echo, Color Doppler and Cardiac Doppler Indications:    Bacteremia R78.81  History:        Patient has prior history of Echocardiogram examinations, most                 recent 11/28/2020. CAD, Prior CABG, Arrythmias:Atrial                 Fibrillation; Risk Factors:Hypertension, Diabetes and                 Dyslipidemia. ESRD (end stage renal disease).  Sonographer:    Alvino Chapel RCS Referring Phys: [id number] PAULA V ROSS PROCEDURE: The transesophogeal probe was passed without difficulty through the esophogus of the patient. Sedation performed by different physician. The patient developed no complications during the procedure. IMPRESSIONS  1. Left ventricular ejection fraction, by estimation, is 50 to 55%. The left ventricle has low normal function.  2. Right ventricular systolic function is low normal. The right ventricular size is moderately enlarged.  3. Left atrial size was severely dilated. No left atrial/left atrial appendage thrombus was detected. The LAA emptying velocity was 65 cm/s.  4. Right atrial size was severely dilated.  5. The mitral valve is normal in structure. Mild mitral valve regurgitation. No evidence  of mitral stenosis.  6. The tricuspid valve is abnormal. Tricuspid valve regurgitation is moderate.  7. The aortic valve is tricuspid. There is mild calcification of the aortic valve. Aortic valve regurgitation is not visualized. No aortic stenosis is present. Conclusion(s)/Recommendation(s): No evidence of vegetation/infective endocarditis on this transesophageal echocardiogram. FINDINGS  Left Ventricle: Left ventricular ejection fraction, by estimation, is 50 to 55%. The left ventricle has low normal function. The left ventricular internal cavity size was normal in size. Right Ventricle: The right ventricular size is moderately enlarged. Right vetricular wall thickness was not assessed. Right ventricular systolic function is low normal. Left Atrium: Left atrial size was severely dilated. No left atrial/left atrial appendage thrombus was detected. The LAA emptying velocity was 65 cm/s. Right Atrium: Right atrial size was severely dilated. Pericardium: There is no evidence of pericardial effusion. Mitral Valve: The mitral valve is normal in structure. Mild mitral valve regurgitation. No evidence of mitral valve stenosis. Tricuspid Valve: The tricuspid valve is abnormal. Tricuspid valve regurgitation is moderate . No evidence of tricuspid stenosis. Aortic Valve: The aortic valve is tricuspid. There is mild calcification of the  aortic valve. Aortic valve regurgitation is not visualized. No aortic stenosis is present. Pulmonic Valve: The pulmonic valve was not well visualized. Pulmonic valve regurgitation is trivial. No evidence of pulmonic stenosis. Aorta: The aortic root is normal in size and structure. There is minimal (Grade I) plaque. IAS/Shunts: No atrial level shunt detected by color flow Doppler. Carlyle Dolly MD Electronically signed by Carlyle Dolly MD Signature Date/Time: 12/05/2020/4:24:16 PM    Final    ECHOCARDIOGRAM LIMITED  Result Date: 11/28/2020    ECHOCARDIOGRAM LIMITED REPORT   Patient  Name:   Travis Blair [facility] Date of Exam: 11/28/2020 Medical Rec #:  [id number]           Height:       74.0 in Accession #:    [id number]          Weight:       261.9 lb Date of Birth:  26-Jan-1951           BSA:          2.437 m Patient Age:    75 years            BP:           124/56 mmHg Patient Gender: M                   HR:           72 bpm. Exam Location:  [facility] Procedure: Limited Echo, Color Doppler and Cardiac Doppler Indications:    Dyspnea R06.00  History:        Patient has prior history of Echocardiogram examinations, most                 recent 06/06/2020. CAD, Prior CABG, Arrythmias:Atrial                 Fibrillation, Signs/Symptoms:Shortness of Breath; Risk                 Factors:Hypertension, Diabetes and Dyslipidemia.  Sonographer:    Alvino Chapel RCS Referring Phys: [id number] [place]  1. Left ventricular ejection fraction, by estimation, is 45 to 50%. The left ventricle has mildly decreased function. The left ventricle demonstrates regional wall motion abnormalities (see scoring diagram/findings for description). There is severe asymmetric left ventricular hypertrophy of the basal and septal segments. Left ventricular diastolic parameters are indeterminate. There is the interventricular septum is flattened in systole and diastole, consistent with right ventricular pressure and volume overload.  2. Right ventricular systolic function is low normal. The right ventricular size is mildly enlarged. There is moderately elevated pulmonary artery systolic pressure. The estimated right ventricular systolic pressure is 78.2 mmHg.  3. Left atrial size was mildly dilated.  4. Right atrial size was mildly dilated.  5. Left pleural effusion evident. A small pericardial effusion is present. The pericardial effusion is posterior to the left ventricle.  6. The mitral valve is abnormal. Mild mitral valve regurgitation.  7. The aortic valve is tricuspid. There is moderate calcification  of the aortic valve. Aortic valve regurgitation is not visualized.  8. The inferior vena cava is dilated in size with <50% respiratory variability, suggesting right atrial pressure of 15 mmHg. Comparison(s): Prior images reviewed side by side. Reduced LVEF and wall motion abnormalities are not new based on my review of the prior study. FINDINGS  Left Ventricle: Left ventricular ejection fraction, by estimation, is 45 to 50%. The left ventricle has mildly decreased function. The left ventricle  demonstrates regional wall motion abnormalities. Definity contrast agent was given IV to delineate the left ventricular endocardial borders. There is severe asymmetric left ventricular hypertrophy of the basal and septal segments. The interventricular septum is flattened in systole and diastole, consistent with right ventricular pressure and volume overload. Left ventricular diastolic parameters are indeterminate. Left ventricular diastolic function could not be evaluated due to atrial fibrillation.  LV Wall Scoring: The basal inferior segment is akinetic. The posterior wall, mid inferoseptal segment, mid inferior segment, and basal inferoseptal segment are hypokinetic. The entire anterior wall, antero-lateral wall, entire anterior septum, and entire apex are normal. Right Ventricle: The right ventricular size is mildly enlarged. No increase in right ventricular wall thickness. Right ventricular systolic function is low normal. There is moderately elevated pulmonary artery systolic pressure. The tricuspid regurgitant  velocity is 3.12 m/s, and with an assumed right atrial pressure of 15 mmHg, the estimated right ventricular systolic pressure is 81.8 mmHg. Left Atrium: Left atrial size was mildly dilated. Right Atrium: Right atrial size was mildly dilated. Pericardium: Left pleural effusion evident. A small pericardial effusion is present. The pericardial effusion is posterior to the left ventricle. Mitral Valve: The mitral  valve is abnormal. There is mild thickening of the mitral valve leaflet(s). There is mild calcification of the mitral valve leaflet(s). Mild mitral valve regurgitation. Tricuspid Valve: The tricuspid valve is grossly normal. Tricuspid valve regurgitation is mild. Aortic Valve: The aortic valve is tricuspid. There is moderate calcification of the aortic valve. There is mild to moderate aortic valve annular calcification. Aortic valve regurgitation is not visualized. Aorta: The aortic root is normal in size and structure. Venous: The inferior vena cava is dilated in size with less than 50% respiratory variability, suggesting right atrial pressure of 15 mmHg. IAS/Shunts: No atrial level shunt detected by color flow Doppler. LEFT VENTRICLE PLAX 2D LVIDd:         5.50 cm LVIDs:         4.30 cm LV PW:         1.30 cm LV IVS:        1.80 cm LVOT diam:     2.30 cm LVOT Area:     4.15 cm  RIGHT VENTRICLE TAPSE (M-mode): 1.7 cm LEFT ATRIUM              Index        RIGHT ATRIUM           Index LA diam:        4.20 cm  1.72 cm/m   RA Area:     27.30 cm LA Vol (A2C):   102.0 ml 41.85 ml/m  RA Volume:   96.50 ml  39.59 ml/m LA Vol (A4C):   74.5 ml  30.57 ml/m LA Biplane Vol: 91.8 ml  37.66 ml/m   AORTA Ao Root diam: 3.80 cm MITRAL VALVE                TRICUSPID VALVE MV Area (PHT): 5.02 cm     TR Peak grad:   38.9 mmHg MV Decel Time: 151 msec     TR Vmax:        312.00 cm/s MV E velocity: 143.00 cm/s                             SHUNTS  Systemic Diam: 2.30 cm Rozann Lesches MD Electronically signed by Rozann Lesches MD Signature Date/Time: 11/28/2020/1:03:27 PM    Final     Irwin Brakeman, MD   [facility]  If 7PM-7AM, please contact night-coverage [email or web address] Password [email or web address] 12/06/2020, 5:42 PM   LOS: 9 days

## 2020-12-06 NOTE — Progress Notes (Signed)
ANTICOAGULATION CONSULT NOTE  Pharmacy Consult for Heparin Indication: hx DVT/afib  Allergies  Allergen Reactions   Latex Hives and Rash   Lipitor [Atorvastatin]     Leg aches     Patient Measurements: Height: 6\' 2"  (188 cm) Weight: 116.6 kg (257 lb 0.9 oz) IBW/kg (Calculated) : 82.2 HEPARIN DW (KG): 107.6   Vital Signs: Temp: 98 F (36.7 C) (10/26 1350) Temp Source: Oral (10/26 1350) BP: 121/59 (10/26 1430) Pulse Rate: 78 (10/26 1430)  Labs: Recent Labs    12/04/20 0341 12/04/20 0947 12/05/20 0757 12/05/20 1958 12/06/20 0558 12/06/20 1400  HGB 9.7*  --  10.5*  --  9.4*  --   HCT 31.5*  --  34.0*  --  31.0*  --   PLT 214  --  226  --  255  --   APTT  --    < > 73* 50* 92*  --   HEPARINUNFRC  --    < > 0.51  --  0.46 0.39  CREATININE 7.55*  --  6.28*  --   --   --    < > = values in this interval not displayed.     Estimated Creatinine Clearance: 15.1 mL/min (A) (by C-G formula based on SCr of 6.28 mg/dL (H)).   Assessment: 70 y.o. male on apixaban PTA for h/o DVT/PE and Afib, now being held. Last LE doppler July 2022 was negative. Pharmacy consulted for heparin.  HL 0.39- therapeutic    Goal of Therapy:   Heparin level 0.3-0.7 units/ml Monitor platelets by anticoagulation protocol: Yes   Plan:  Continue heparin infusion at 2800 units/hr Heparin level daily Monitor for signs/symptoms of bleeding  F/u restart apixaban   Margot Ables, PharmD Clinical Pharmacist 12/06/2020 2:38 PM

## 2020-12-06 NOTE — Telephone Encounter (Signed)
Travis Blair, NT 5 hours ago (11:04 AM)   Pt came in needing some ppw filled out by pcp. PPW placed in nurse's folder, please contact pt when available to pick up.   Cb#: [phone]

## 2020-12-06 NOTE — Progress Notes (Signed)
Patient ID: Travis Blair, male   DOB: [birth year], 70 y.o.   MRN: [id number] S: Feels "rough" and is very weak.  Unable to stand to use bedside commode. O:BP 130/70 (BP Location: Right Arm)   Pulse 75   Temp 98 F (36.7 C)   Resp (!) 21   Ht 6\' 2"  (1.88 m)   Wt 114.6 kg   SpO2 91%   BMI 32.44 kg/m   Intake/Output Summary (Last 24 hours) at 12/06/2020 1137 Last data filed at 12/05/2020 1735 Gross per 24 hour  Intake 240 ml  Output 0 ml  Net 240 ml   Intake/Output: I/O last 3 completed shifts: In: 240 [P.O.:240] Out: 0   Intake/Output this shift:  No intake/output data recorded. Weight change:  Gen:NAD CVS: RRR Resp:CTA Abd: +BS, soft, NT/ND Ext: trace pretibial edema, LUE AVF +T/B  Recent Labs  Lab 11/30/20 0453 12/01/20 0538 12/02/20 0435 12/04/20 0341 12/05/20 0757  NA 133* 130*  --  129* 129*  K 3.9 4.3  --  4.2 4.0  CL 97* 93*  --  91* 92*  CO2 25 24  --  23 26  GLUCOSE 120* 162*  --  283* 237*  BUN 49* 69*  --  97* 71*  CREATININE 5.11* 6.35*  --  7.55* 6.28*  ALBUMIN 2.5* 2.5* 3.1* 2.6* 2.6*  CALCIUM 8.0* 8.4*  --  8.1* 8.0*  PHOS 3.2 3.9  --   --   --   AST  --   --  347* 138* 88*  ALT  --   --  130* 180* 141*   Liver Function Tests: Recent Labs  Lab 12/02/20 0435 12/04/20 0341 12/05/20 0757  AST 347* 138* 88*  ALT 130* 180* 141*  ALKPHOS 174* 194* 184*  BILITOT 1.6* 0.8 0.8  PROT 6.9 6.0* 6.5  ALBUMIN 3.1* 2.6* 2.6*   No results for input(s): LIPASE, AMYLASE in the last 168 hours. No results for input(s): AMMONIA in the last 168 hours. CBC: Recent Labs  Lab 12/02/20 0435 12/03/20 0447 12/04/20 0341 12/05/20 0757 12/06/20 0558  WBC 11.3* 12.0* 13.9* 16.0* 15.6*  HGB 9.8* 9.4* 9.7* 10.5* 9.4*  HCT 32.9* 31.7* 31.5* 34.0* 31.0*  MCV 91.4 91.1 89.0 89.5 90.1  PLT 178 207 214 226 255   Cardiac Enzymes: No results for input(s): CKTOTAL, CKMB, CKMBINDEX, TROPONINI in the last 168 hours. CBG: Recent Labs  Lab 11/29/20 2009  12/02/20 1419 12/02/20 1544  GLUCAP 130* 137* 135*    Iron Studies: No results for input(s): IRON, TIBC, TRANSFERRIN, FERRITIN in the last 72 hours. Studies/Results: MR LUMBAR SPINE WO CONTRAST  Result Date: 12/05/2020 CLINICAL DATA:  Low back pain, infection suspected low back pain. Recurrent MRSA bacteremia EXAM: MRI LUMBAR SPINE WITHOUT CONTRAST TECHNIQUE: Multiplanar, multisequence MR imaging of the lumbar spine was performed. No intravenous contrast was administered. COMPARISON:  None. FINDINGS: Motion artifact is present. Segmentation: Standard. Alignment:  No significant listhesis. Vertebrae: Mildly decreased T1 marrow signal may reflect hematopoietic marrow. No substantial marrow edema. No suspicious osseous lesion Conus medullaris and cauda equina: Conus extends to the L1 level. Conus and cauda equina appear normal. Paraspinal and other soft tissues: Unremarkable. Disc levels: L1-L2:  No canal or foraminal stenosis. L2-L3: Disc bulge. Facet arthropathy with ligamentum flavum infolding. No canal stenosis. Mild foraminal stenosis. L3-L4: Level was not included on axial imaging. On sagittal imaging, there is a disc bulge and central annular fissure as well as facet arthropathy. Likely at least  mild canal stenosis. Mild to moderate right and mild left foraminal stenosis. L4-L5: Disc bulge. Marked facet arthropathy with ligamentum flavum infolding and joint effusions with extra-spinal synovial cyst formation. Marked canal stenosis with effacement of subarticular recesses. Mild to moderate foraminal stenosis. L5-S1: Disc bulge with small central annular fissure. No canal or foraminal stenosis. Signal within the right lateral epidural space may reflect traversing vessels. IMPRESSION: Degraded by motion.  No evidence of discitis/osteomyelitis. Multilevel degenerative changes as detailed above. There is marked canal stenosis at L4-L5 with effacement of subarticular recesses. Facet arthropathy is also  greatest at this level. The L3-L4 level was not included on axial imaging due to technical error. There is likely at least mild canal stenosis, mild to moderate right foraminal stenosis, and mild left foraminal stenosis. Facet arthropathy is present at this level. If desired, patient can be brought back for repeat scanning without charge. Electronically Signed   By: Macy Mis M.D.   On: 12/05/2020 17:26   US Abdomen Complete  Result Date: 12/04/2020 CLINICAL DATA:  Elevated LFT EXAM: ABDOMEN ULTRASOUND COMPLETE COMPARISON:  Renal ultrasound 04/18/2017 FINDINGS: Gallbladder: No shadowing stones. Small fluid around the gallbladder. Negative sonographic Murphy. Increased wall thickness of 5.2 mm. Common bile duct: Diameter: 4.8 mm Liver: No focal lesion identified. Within normal limits in parenchymal echogenicity. Portal vein is patent on color Doppler imaging with normal direction of blood flow towards the liver. IVC: No abnormality visualized. Pancreas: Obscured by bowel gas Spleen: Slightly enlarged with volume of 526 mL. Right Kidney: Length: 12.3 cm.  Lower pole cyst measuring 2.2 cm. Left Kidney: Length: 13.3 cm. Echogenicity within normal limits. No mass or hydronephrosis visualized. Abdominal aorta: No aneurysm visualized. Other findings: Incidental bilateral pleural effusions. Small amount of ascites within the abdomen. IMPRESSION: 1. Negative for gallstones. Slight increased gallbladder wall thickness but negative sonographic Percell Miller, findings are nonspecific. There is a small amount of fluid adjacent to the gallbladder likely related to generalized small ascites in the right upper quadrant. 2. Liver grossly within normal limits. 3. Mild splenomegaly 4. Small amount of ascites within the upper and lower abdomen. 5. Bilateral pleural effusions Electronically Signed   By: Donavan Foil M.D.   On: 12/04/2020 16:00   ECHO TEE  Result Date: 12/05/2020    TRANSESOPHOGEAL ECHO REPORT   Patient Name:    Travis Blair [facility] Date of Exam: 12/05/2020 Medical Rec #:  [id number]           Height:       74.0 in Accession #:    [id number]          Weight:       252.6 lb Date of Birth:  December 05, 1950           BSA:          2.400 m Patient Age:    54 years            BP:           132/59 mmHg Patient Gender: M                   HR:           78 bpm. Exam Location:  [facility] Procedure: Transesophageal Echo, Color Doppler and Cardiac Doppler Indications:    Bacteremia R78.81  History:        Patient has prior history of Echocardiogram examinations, most  recent 11/28/2020. CAD, Prior CABG, Arrythmias:Atrial                 Fibrillation; Risk Factors:Hypertension, Diabetes and                 Dyslipidemia. ESRD (end stage renal disease).  Sonographer:    Alvino Chapel RCS Referring Phys: [id number] PAULA V ROSS PROCEDURE: The transesophogeal probe was passed without difficulty through the esophogus of the patient. Sedation performed by different physician. The patient developed no complications during the procedure. IMPRESSIONS  1. Left ventricular ejection fraction, by estimation, is 50 to 55%. The left ventricle has low normal function.  2. Right ventricular systolic function is low normal. The right ventricular size is moderately enlarged.  3. Left atrial size was severely dilated. No left atrial/left atrial appendage thrombus was detected. The LAA emptying velocity was 65 cm/s.  4. Right atrial size was severely dilated.  5. The mitral valve is normal in structure. Mild mitral valve regurgitation. No evidence of mitral stenosis.  6. The tricuspid valve is abnormal. Tricuspid valve regurgitation is moderate.  7. The aortic valve is tricuspid. There is mild calcification of the aortic valve. Aortic valve regurgitation is not visualized. No aortic stenosis is present. Conclusion(s)/Recommendation(s): No evidence of vegetation/infective endocarditis on this transesophageal echocardiogram. FINDINGS  Left Ventricle: Left  ventricular ejection fraction, by estimation, is 50 to 55%. The left ventricle has low normal function. The left ventricular internal cavity size was normal in size. Right Ventricle: The right ventricular size is moderately enlarged. Right vetricular wall thickness was not assessed. Right ventricular systolic function is low normal. Left Atrium: Left atrial size was severely dilated. No left atrial/left atrial appendage thrombus was detected. The LAA emptying velocity was 65 cm/s. Right Atrium: Right atrial size was severely dilated. Pericardium: There is no evidence of pericardial effusion. Mitral Valve: The mitral valve is normal in structure. Mild mitral valve regurgitation. No evidence of mitral valve stenosis. Tricuspid Valve: The tricuspid valve is abnormal. Tricuspid valve regurgitation is moderate . No evidence of tricuspid stenosis. Aortic Valve: The aortic valve is tricuspid. There is mild calcification of the aortic valve. Aortic valve regurgitation is not visualized. No aortic stenosis is present. Pulmonic Valve: The pulmonic valve was not well visualized. Pulmonic valve regurgitation is trivial. No evidence of pulmonic stenosis. Aorta: The aortic root is normal in size and structure. There is minimal (Grade I) plaque. IAS/Shunts: No atrial level shunt detected by color flow Doppler. Carlyle Dolly MD Electronically signed by Carlyle Dolly MD Signature Date/Time: 12/05/2020/4:24:16 PM    Final     calcitRIOL  0.5 mcg Oral Q M,W,F   calcium acetate  1,334 mg Oral TID with meals   Chlorhexidine Gluconate Cloth  6 each Topical Q0600   loratadine  10 mg Oral q1800   mouth rinse  15 mL Mouth Rinse BID   mupirocin ointment   Nasal BID   pantoprazole  40 mg Oral Daily   silver sulfADIAZINE   Topical BID    BMET    Component Value Date/Time   NA 129 (L) 12/05/2020 0757   NA 138 09/02/2019 1331   K 4.0 12/05/2020 0757   CL 92 (L) 12/05/2020 0757   CO2 26 12/05/2020 0757   GLUCOSE 237 (H)  12/05/2020 0757   BUN 71 (H) 12/05/2020 0757   BUN 51 (H) 09/02/2019 1331   CREATININE 6.28 (H) 12/05/2020 0757   CREATININE 3.49 (H) 05/23/2020 1426   CALCIUM 8.0 (L) 12/05/2020  [place] (L) 12/05/2020 0757   GFRNONAA 17 (L) 05/23/2020 1426   GFRAA 20 (L) 05/23/2020 1426   CBC    Component Value Date/Time   WBC 15.6 (H) 12/06/2020 0558   RBC 3.44 (L) 12/06/2020 0558   HGB 9.4 (L) 12/06/2020 0558   HGB 11.6 (L) 09/02/2019 1331   HCT 31.0 (L) 12/06/2020 0558   HCT 37.8 09/02/2019 1331   PLT 255 12/06/2020 0558   PLT 231 09/02/2019 1331   MCV 90.1 12/06/2020 0558   MCV 86 09/02/2019 1331   MCH 27.3 12/06/2020 0558   MCHC 30.3 12/06/2020 0558   RDW 18.8 (H) 12/06/2020 0558   RDW 14.6 09/02/2019 1331   LYMPHSABS 0.8 09/01/2020 2140   LYMPHSABS 1.4 09/02/2019 1331   MONOABS 0.9 09/01/2020 2140   EOSABS 0.1 09/01/2020 2140   EOSABS 0.2 09/02/2019 1331   BASOSABS 0.0 09/01/2020 2140   BASOSABS 0.0 09/02/2019 1331    Dialysis Orders: Center: [facility]  on MWF . EDW 120.5kg HD [place] 3K/2.5Ca  Time 4 hours Heparin 2000 units bolus. Access RIJ TDC (maturing LUE AVF) BFR 400 DFR 800    Calcitriol 0.5 mcg po/HD Venofer  100 mg TIW, Micera 100 mcg IVP every 2 weeks      Assessment/Plan:  Acute hypoxic respiratory failure - presumably due to volume overload and acute diastolic CHF.  Improved with ongoing HD and UF.   Will continue to challenge edw (already 6 kg below edw).  ESRD -  continue with HD MWF while he remains an inpatient.  Hypertension/volume  - as above, will continue to challenge edw.  Anemia  - stable  Metabolic bone disease -  continue with home meds  Nutrition -  renal diet, carb modified.  Chronic atrial fibrillation - rate controlled History of DVT/PE - to resume apixaban. ID  - had fever with + blood cultures, initially for MRSA and now E. Coli (12/03/20).  TDC removed 11/29/20 and on IV cefepime and Vancomycin.  Continue to follow. TEE 12/05/20 negative for  vegetations and MRI of LS spine negative for osteo but MRI of foot suspicious for early osteo of fifth metatarsal head. New blood cultures drawn today. Per ID for IV vanc and fortaz for 6 weeks for now. Vascular access - TDC removed 10/19 and using LUE AVF but has high venous pressures.  Will hold off on new [facility] for now and continue to use AVF. Deconditioning - pt is very weak and unable to stand.  Will likely need PT/OT and SNF placement for rehab.   Donetta Potts, MD [employer] [phone]

## 2020-12-06 NOTE — Progress Notes (Signed)
PT Cancellation Note  Patient Details Name: Travis Blair MRN: [id number] DOB: 09-11-1950   Cancelled Treatment:    Reason Eval/Treat Not Completed: Patient at procedure or test/unavailable.  Will check back tomorrow.   3:54 PM, 12/06/20 Lonell Grandchild, MPT Physical Therapist with [facility] [phone] office [phone] mobile phone

## 2020-12-07 ENCOUNTER — Encounter (HOSPITAL_COMMUNITY): Payer: Self-pay | Admitting: Cardiology

## 2020-12-07 LAB — CBC
HCT: 32.6 % — ABNORMAL LOW (ref 39.0–52.0)
Hemoglobin: 9.7 g/dL — ABNORMAL LOW (ref 13.0–17.0)
MCH: 26.6 pg (ref 26.0–34.0)
MCHC: 29.8 g/dL — ABNORMAL LOW (ref 30.0–36.0)
MCV: 89.6 fL (ref 80.0–100.0)
Platelets: 238 10*3/uL (ref 150–400)
RBC: 3.64 MIL/uL — ABNORMAL LOW (ref 4.22–5.81)
RDW: 18.5 % — ABNORMAL HIGH (ref 11.5–15.5)
WBC: 16.2 10*3/uL — ABNORMAL HIGH (ref 4.0–10.5)
nRBC: 0 % (ref 0.0–0.2)

## 2020-12-07 LAB — GLUCOSE, CAPILLARY
Glucose-Capillary: 181 mg/dL — ABNORMAL HIGH (ref 70–99)
Glucose-Capillary: 268 mg/dL — ABNORMAL HIGH (ref 70–99)
Glucose-Capillary: 219 mg/dL — ABNORMAL HIGH (ref 70–99)
Glucose-Capillary: 260 mg/dL — ABNORMAL HIGH (ref 70–99)
Glucose-Capillary: 143 mg/dL — ABNORMAL HIGH (ref 70–99)

## 2020-12-07 LAB — HEPARIN LEVEL (UNFRACTIONATED): Heparin Unfractionated: 0.13 IU/mL — ABNORMAL LOW (ref 0.30–0.70)

## 2020-12-07 MED ORDER — APIXABAN 5 MG PO TABS
5.0000 mg | ORAL_TABLET | Freq: Two times a day (BID) | ORAL | Status: DC
  Administered 2020-12-07 – 2020-12-08 (×2): 5 mg via ORAL
  Filled 2020-12-07 (×3): qty 1

## 2020-12-07 MED ORDER — INSULIN ASPART 100 UNIT/ML IJ SOLN
0.0000 [IU] | Freq: Three times a day (TID) | INTRAMUSCULAR | Status: DC
  Administered 2020-12-07: 3 [IU] via SUBCUTANEOUS
  Administered 2020-12-07: 2 [IU] via SUBCUTANEOUS
  Administered 2020-12-07: 3 [IU] via SUBCUTANEOUS
  Administered 2020-12-08: 1 [IU] via SUBCUTANEOUS

## 2020-12-07 MED ORDER — CHLORHEXIDINE GLUCONATE CLOTH 2 % EX PADS
6.0000 | MEDICATED_PAD | Freq: Every day | CUTANEOUS | Status: DC
  Administered 2020-12-08: 6 via TOPICAL

## 2020-12-07 MED ORDER — INSULIN GLARGINE-YFGN 100 UNIT/ML ~~LOC~~ SOLN
10.0000 [IU] | Freq: Every day | SUBCUTANEOUS | Status: DC
  Administered 2020-12-07: 10 [IU] via SUBCUTANEOUS
  Filled 2020-12-07 (×4): qty 0.1

## 2020-12-07 MED ORDER — INSULIN ASPART 100 UNIT/ML IJ SOLN
2.0000 [IU] | Freq: Three times a day (TID) | INTRAMUSCULAR | Status: DC
  Administered 2020-12-07 – 2020-12-08 (×4): 2 [IU] via SUBCUTANEOUS

## 2020-12-07 NOTE — Progress Notes (Signed)
[facility]  Verbal consent from patient.   Wound open but fibrinous tissue on edges, prepped with betadine and trimmed edges with scalpel and scissors. Erythema improved. Got down to bleeding tissue. Bone is covered with fascia. Pressure held after trimmed edges.   MRI with abscess and reactive changes, no definitive osteomyelitis of the 5th metatarsal and again the bone is covered. ID recommending 6 weeks of IV antibiotics to cover just in case,so would be getting treatment regardless for any potential osteomyelitis forming.  Can go to [facility] Instructions: Twice Daily: Cleanse with NS and pat dry. Take a Q tip with saline and probe the wounds (especially the lateral wound toward the top of the foot). Then with a q tip, Apply Silvadene to right lateral foot wounds (lateral and plantar surface), probe the two wounds with a qtip covered with silvadene and pack the area with the silvadene, going up into the tunnel as the lateral ulcer tracks toward the top of the foot about 1 cm, cover with kerlix and sock or ACE wrap.  Antibiotics per ID. Will see in clinic 10/11 for wound check. Vascular as outpatient given known abnormal ABI, has a 1+ DP but may benefit from some revascularization. Has seen Vascular for Fistula in the past.   Discussed with Dr. Wynetta Emery.   Predebridement   Post debridement:

## 2020-12-07 NOTE — Progress Notes (Signed)
PHARMACY CONSULT NOTE FOR:  OUTPATIENT  PARENTERAL ANTIBIOTIC THERAPY (OPAT)  Indication: MRSA and E. Coli bacteremia Regimen: Vancomycin 1250 mg IV every MWF w/ HD Cefazolin 2000 mg IV every MWF w/h HD End date: 01/16/2021  IV antibiotic discharge orders are pended. To discharging provider:  please sign these orders via discharge navigator,  Select New Orders & click on the button choice - Manage This Unsigned Work.     Thank you for allowing pharmacy to be a part of this patient's care.  Ramond Craver 12/07/2020, 2:15 PM

## 2020-12-07 NOTE — Progress Notes (Addendum)
PROGRESS NOTE  Travis Blair ZHY:[id number] DOB: 08/25/1950 DOA: 11/27/2020 PCP: [name], MD  Brief History:   70 y.o. male with medical history significant of CHF, ERSD on HD (M-W-F), CAD, DVT/PE, and T2DM, CABG with post operative atrial fibrillation, pulmonary hypertension who presented with dyspnea.  Patient reported having cough for about 2 weeks, persistent and worsening.  Tried several antitussive agents at home without improvement of symptoms.  He went to dialysis on Friday, oct 14. He e was noted to be dyspneic by his wife, moderate in intensity, associated with cough.  He has had a chronic cough for at least one year, worse over last week prior to [place].  He took some robitussin and codeine from an old prescription on 11/26/20.  On Monday, he was more unsteady and fell in the BR.  He had subjective f/c at home since 11/24/20.  He denied n/v/d, abd pain.     His main symptom seems to be increased work of breathing/dyspnea, moderate to severe in intensity, worse with exertion, associated with cough, no chest pain, no improving factors.  Last night not able to sleep due to persistent cough, dry.  He continues to make urine 3 times daily.     In June 2022 he decided to stop apixaban due to weakness, he was placed on clopidogrel.  Last year he had COVID-pneumonia and pulm embolism.  About 3 months ago he was started on hemodialysis, he has a fistula which is not mature yet.  He uses a tunneled dialysis catheter for dialysis.  Since admission, nephrology was consulted to assist.  He was dialyzed on 10/17 and 10/19.  He was found to have MRSA bacteremia.  General surgery was consulted to remove his Permacath.  He was started on IV vancomycin 11/29/20.  Repeat blood culture on 10/20 showed Ecoli, and he was started on cefepime.   Assessment/Plan: Sepsis - RESOLVED -present on admission -had fever and leukocytosis -due to MRSA bacteremia -sepsis physiology resolved    MRSA bacteremia/EColi Bacteremia -removed tunneled HD catheter 10/19 after HD -10/18 blood culture = MRSA (peripheral) -10/20 blood culture = E Coli (peripheral) -12/01/20 blood culture = S aureus (on HD via fistula) -12/03/20 blood culture = GNR (peripheral) -12/05/20 Blood culture = neg -11/28/20 TTE--no obvious vegetation -able to to use fistula for HD 10/21 -12/05/20 TEE--no vegetation -12/05/20 MR lumbar>>no abscess or osteomyelitis/discitis -will need to delay permacath placement until neg blood culture -12/05/20--discussed with ID, Dr. Henreitta Cea current abx for now - per ID recommendation, continue IV abx for 6 weeks thru 01/16/21   EColi Bacteremia -continue cefepime on HD>>plan to switch to cefazolin on HD based on susceptibilities -10/20 blood culture = EColi--no MRSA -10/23 blood culture= EColi + MRSA -10/25 Blood culture  = no growth to date (x 2 days now)   Right Diabetic foot ulcer/ Possible Early Osteomyelitis 5th metatarsal -spouse states wound is improving with care at home -MR right foot--Soft tissue ulceration along the lateral forefoot near the fifth metatarsal head with underlying 2.3 cm curvilinear fluid collection,concerning for abscess -consult wound care -discussed with surgery--foot debrided 10/22>>no pus -10/24 wider foot debridement with small amount purulence -appreciate general surgery, Dr. Constance Haw -further bedside evaluation 10/27 done by Dr. Constance Haw - Wound care instructions provided. Pt can go to SNF [facility]) to continue wound care. Wound care instructions:  Cleanse with NS and pat dry. Take a Q tip with saline and probe the wounds (  especially the lateral wound toward the top of the foot). Then with a q tip, Apply Silvadene to right lateral foot wounds (lateral and plantar surface), probe the two wounds with a qtip covered with silvadene and pack the area with the silvadene, going up into the tunnel as the lateral ulcer tracks toward the top  of the foot about 1 cm, cover with kerlix and sock or ACE wrap.  Acute respiratory failure with hypoxia -due to pulmonary edema/pleural effusions -presented with oxygen saturation 87% on RA and tachypnea -stable on RA at this time -weaned off oxygen for saturation >92%   Acute on chronic diastolic CHF/Acute systolic CHF -6/94/85 Echo 60-65%, no WMA; RV systolic function preserved.  RSVP 57.3.  No significant valvular disease. -fluid removal on HD -11/28/20 Echo EF 45-50%+WMA, RV overload, no vegetation -stable on RA at this time   Dysphagia -12/02/20 EGD--no esophageal abnormality to explain dysphagia; dilated; mild portal hypertensive gastropathy -suspect he has esophageal dysmotility from his medical issues -swallowing better since dilatation   ESRD on hemodialysis  -appreciate renal consult -HD on 10/17, 10/19, 10/21, 10/24 -10/21, 10/24--able to use fistula   History DVT/PE -he is restarted back on apixaban by Dr. Carles Collet - no further procedures anticipated.    Chronic Afib -AC as discussed above -pt was restarted on apixaban for stroke prevention, monitor for bleeding.  Off IV heparin 10/27.     Uncontrolled Diabetes mellitus type 2 with hyperglycemia -08/18/20 A1C--8.0 -CBGs controlled so far -novolog sliding scale -10/19- A1C 6.3 -add glargine 10 units QHS  CBG (last 3)  Recent Labs    12/07/20 0911 12/07/20 1101 12/07/20 1606  GLUCAP 268* 219* 260*     Right Elbow pain -due to recent fall on 11/27/20 -xray--no fracture; small anterior joint eff -improved--no further synovitis   Transaminasemia -likely due to sepsis and hemodynamic changes -trending down -f/u abd US--neg gallstones, slight GB thickening without murphy's; mild splenomegaly     Status is: Inpatient   Remains inpatient appropriate because: severity of illness    Family Communication:   daughter updated 10/25   Consultants:  renal, general surgery, GI, ID   Code Status:  FULL   DVT  Prophylaxis:  apixaban  DISPO:  Pt is now medically stable to discharge to SNF.  Insurance authorization has not been received today.  If we receive it tomorrow will plan to Discharge SNF 10/28  after hemodialysis completed.       Procedures: As Listed in Progress Note Above   Antibiotics: Vanco 10/19>> Cefepime 10/21>>10/25 Cefazolin 10/26>>   Subjective: Patient reports feeling weak after hemodialysis yesterday.  No other complaints.    Objective: Vitals:   12/06/20 1830 12/06/20 2031 12/07/20 0622 12/07/20 1226  BP: 129/67 (!) 101/59 (!) 101/57 (!) 124/52  Pulse: 77 76 77 79  Resp: 18 19 19 18   Temp: 97.9 F (36.6 C) 98.2 F (36.8 C) (!) 97.3 F (36.3 C) 97.6 F (36.4 C)  TempSrc: Oral Oral Oral Oral  SpO2:  95%  93%  Weight:      Height:        Intake/Output Summary (Last 24 hours) at 12/07/2020 1726 Last data filed at 12/07/2020 0900 Gross per 24 hour  Intake 949.24 ml  Output 2812 ml  Net -1862.76 ml   Weight change:  Exam:  General:  Pt is chronically ill appearing, he is alert, follows commands appropriately and answers questions, not in acute distress. HEENT: No icterus, No thrush, No neck mass, [place]/AT  Cardiovascular: normal S1/S2, no rubs, no gallops Respiratory: bibasilar rales.  No wheeze Abdomen: Soft/+BS, non tender, non distended, no guarding Extremities: right foot exam, wound debrided by surgeon, good blood flow seen and 1+ pedal pulse on feet, No edema, No lymphangitis, No petechiae, No rashes, no synovitis    Data Reviewed: I have personally reviewed following labs and imaging studies Basic Metabolic Panel: Recent Labs  Lab 12/01/20 0538 12/04/20 0341 12/05/20 0757 12/06/20 1406  NA 130* 129* 129* 127*  K 4.3 4.2 4.0 4.1  CL 93* 91* 92* 91*  CO2 24 23 26 25   GLUCOSE 162* 283* 237* 244*  BUN 69* 97* 71* 86*  CREATININE 6.35* 7.55* 6.28* 7.38*  CALCIUM 8.4* 8.1* 8.0* 8.3*  PHOS 3.9  --   --  5.7*   Liver Function Tests: Recent  Labs  Lab 12/01/20 0538 12/02/20 0435 12/04/20 0341 12/05/20 0757 12/06/20 1406  AST  --  347* 138* 88*  --   ALT  --  130* 180* 141*  --   ALKPHOS  --  174* 194* 184*  --   BILITOT  --  1.6* 0.8 0.8  --   PROT  --  6.9 6.0* 6.5  --   ALBUMIN 2.5* 3.1* 2.6* 2.6* 2.4*   No results for input(s): LIPASE, AMYLASE in the last 168 hours. No results for input(s): AMMONIA in the last 168 hours. Coagulation Profile: No results for input(s): INR, PROTIME in the last 168 hours. CBC: Recent Labs  Lab 12/03/20 0447 12/04/20 0341 12/05/20 0757 12/06/20 0558 12/07/20 0518  WBC 12.0* 13.9* 16.0* 15.6* 16.2*  HGB 9.4* 9.7* 10.5* 9.4* 9.7*  HCT 31.7* 31.5* 34.0* 31.0* 32.6*  MCV 91.1 89.0 89.5 90.1 89.6  PLT 207 214 226 255 238   Cardiac Enzymes: No results for input(s): CKTOTAL, CKMB, CKMBINDEX, TROPONINI in the last 168 hours. BNP: Invalid input(s): POCBNP CBG: Recent Labs  Lab 12/02/20 1544 12/05/20 1356 12/07/20 0911 12/07/20 1101 12/07/20 1606  GLUCAP 135* 181* 268* 219* 260*   HbA1C: No results for input(s): HGBA1C in the last 72 hours. Urine analysis:    Component Value Date/Time   COLORURINE YELLOW 03/10/2017 1553   APPEARANCEUR HAZY (A) 03/10/2017 1553   LABSPEC 1.013 03/10/2017 1553   PHURINE 5.0 03/10/2017 1553   GLUCOSEU NEGATIVE 03/10/2017 1553   HGBUR NEGATIVE 03/10/2017 1553   BILIRUBINUR NEGATIVE 03/10/2017 1553   KETONESUR NEGATIVE 03/10/2017 1553   PROTEINUR 30 (A) 03/10/2017 1553   NITRITE NEGATIVE 03/10/2017 1553   LEUKOCYTESUR NEGATIVE 03/10/2017 1553   Recent Results (from the past 240 hour(s))  Culture, blood (Routine X 2) w Reflex to ID Panel     Status: Abnormal   Collection Time: 11/28/20  9:34 AM   Specimen: BLOOD RIGHT HAND  Result Value Ref Range Status   Specimen Description   Final    BLOOD RIGHT HAND Performed at [facility], [address] [place], [place] [address]    Special Requests   Final    Immunocompromised Blood Culture  adequate volume BOTTLES DRAWN AEROBIC AND ANAEROBIC Performed at [facility], [address] [place], [place] [address]    Culture  Setup Time   Final    GRAM POSITIVE COCCI IN BOTH AEROBIC AND ANAEROBIC BOTTLES Gram Stain Report Called to,Read Back By and Verified WithLynwood Dawley, 2216, 11/28/2020,SELF S aerobic bottle previously called 10.18.22 2353 matthews, b  Performed at [facility], [address] [place], [place] [address]    Culture (  A)  Final    STAPHYLOCOCCUS AUREUS SUSCEPTIBILITIES PERFORMED ON PREVIOUS CULTURE WITHIN THE LAST 5 DAYS. Performed at [facility], [place] [address], [name], [place] [address]    Report Status 12/02/2020 FINAL  Final  Culture, blood (Routine X 2) w Reflex to ID Panel     Status: Abnormal   Collection Time: 11/28/20  9:34 AM   Specimen: BLOOD RIGHT FOREARM  Result Value Ref Range Status   Specimen Description   Final    BLOOD RIGHT FOREARM Performed at [facility], [address], [place], [place] [address]    Special Requests   Final    Immunocompromised Blood Culture adequate volume BOTTLES DRAWN AEROBIC AND ANAEROBIC Performed at [facility], [address] [place], [place] [address]    Culture  Setup Time   Final    GRAM POSITIVE COCCI IN BOTH AEROBIC AND ANAEROBIC BOTTLES Gram Stain Report Called to,Read Back By and Verified With: T.ALLEN,2236,11/28/2020,SELF S aerobic bottle [facility] previously called matthews, b 10.18.22 2355 CRITICAL RESULT CALLED TO, READ BACK BY AND VERIFIED WITH: T ALLEN,RN@0209  11/29/20 [place]    Culture (A)  Final    METHICILLIN RESISTANT STAPHYLOCOCCUS AUREUS VIRIDANS STREPTOCOCCUS THE SIGNIFICANCE OF ISOLATING THIS ORGANISM FROM A SINGLE SET OF BLOOD CULTURES WHEN MULTIPLE SETS ARE DRAWN IS UNCERTAIN. PLEASE NOTIFY THE MICROBIOLOGY DEPARTMENT WITHIN ONE WEEK IF SPECIATION AND SENSITIVITIES ARE REQUIRED. Performed at [facility], [place] [address], [place], [place] [address]    Report Status 12/02/2020  FINAL  Final   Organism ID, Bacteria METHICILLIN RESISTANT STAPHYLOCOCCUS AUREUS  Final      Susceptibility   Methicillin resistant staphylococcus aureus - MIC*    CIPROFLOXACIN >=8 RESISTANT Resistant     ERYTHROMYCIN >=8 RESISTANT Resistant     GENTAMICIN <=0.5 SENSITIVE Sensitive     OXACILLIN >=4 RESISTANT Resistant     TETRACYCLINE <=1 SENSITIVE Sensitive     VANCOMYCIN 1 SENSITIVE Sensitive     TRIMETH/SULFA <=10 SENSITIVE Sensitive     CLINDAMYCIN >=8 RESISTANT Resistant     RIFAMPIN <=0.5 SENSITIVE Sensitive     Inducible Clindamycin NEGATIVE Sensitive     * METHICILLIN RESISTANT STAPHYLOCOCCUS AUREUS  Blood Culture ID Panel (Reflexed)     Status: Abnormal   Collection Time: 11/28/20  9:34 AM  Result Value Ref Range Status   Enterococcus faecalis NOT DETECTED NOT DETECTED Final   Enterococcus Faecium NOT DETECTED NOT DETECTED Final   Listeria monocytogenes NOT DETECTED NOT DETECTED Final   Staphylococcus species DETECTED (A) NOT DETECTED Final    Comment: CRITICAL RESULT CALLED TO, READ BACK BY AND VERIFIED WITH: T ALLEN,RN@0209  11/29/20 [place]    Staphylococcus aureus (BCID) DETECTED (A) NOT DETECTED Final    Comment: Methicillin (oxacillin)-resistant Staphylococcus aureus (MRSA). MRSA is predictably resistant to beta-lactam antibiotics (except ceftaroline). Preferred therapy is vancomycin unless clinically contraindicated. Patient requires contact precautions if  hospitalized. CRITICAL RESULT CALLED TO, READ BACK BY AND VERIFIED WITH: T ALLEN,RN@0209  11/29/20 [place]    Staphylococcus epidermidis NOT DETECTED NOT DETECTED Final   Staphylococcus lugdunensis NOT DETECTED NOT DETECTED Final   Streptococcus species DETECTED (A) NOT DETECTED Final    Comment: Not Enterococcus species, Streptococcus agalactiae, Streptococcus pyogenes, or Streptococcus pneumoniae. CRITICAL RESULT CALLED TO, READ BACK BY AND VERIFIED WITH: T ALLEN,RN@ 0209 11/29/20 [place]    Streptococcus agalactiae NOT  DETECTED NOT DETECTED Final   Streptococcus pneumoniae NOT DETECTED NOT DETECTED Final   Streptococcus pyogenes NOT DETECTED NOT DETECTED Final  A.calcoaceticus-baumannii NOT DETECTED NOT DETECTED Final   Bacteroides fragilis NOT DETECTED NOT DETECTED Final   Enterobacterales NOT DETECTED NOT DETECTED Final   Enterobacter cloacae complex NOT DETECTED NOT DETECTED Final   Escherichia coli NOT DETECTED NOT DETECTED Final   Klebsiella aerogenes NOT DETECTED NOT DETECTED Final   Klebsiella oxytoca NOT DETECTED NOT DETECTED Final   Klebsiella pneumoniae NOT DETECTED NOT DETECTED Final   Proteus species NOT DETECTED NOT DETECTED Final   Salmonella species NOT DETECTED NOT DETECTED Final   Serratia marcescens NOT DETECTED NOT DETECTED Final   Haemophilus influenzae NOT DETECTED NOT DETECTED Final   Neisseria meningitidis NOT DETECTED NOT DETECTED Final   Pseudomonas aeruginosa NOT DETECTED NOT DETECTED Final   Stenotrophomonas maltophilia NOT DETECTED NOT DETECTED Final   Candida albicans NOT DETECTED NOT DETECTED Final   Candida auris NOT DETECTED NOT DETECTED Final   Candida glabrata NOT DETECTED NOT DETECTED Final   Candida krusei NOT DETECTED NOT DETECTED Final   Candida parapsilosis NOT DETECTED NOT DETECTED Final   Candida tropicalis NOT DETECTED NOT DETECTED Final   Cryptococcus neoformans/gattii NOT DETECTED NOT DETECTED Final   Meth resistant mecA/C and MREJ DETECTED (A) NOT DETECTED Final    Comment: CRITICAL RESULT CALLED TO, READ BACK BY AND VERIFIED WITH: T ALLEN,RN@0209  11/29/20 [place] Performed at [facility], [address] [place], [place] [address]   Culture, blood (Routine X 2) w Reflex to ID Panel     Status: Abnormal   Collection Time: 11/30/20  4:53 AM   Specimen: BLOOD RIGHT HAND  Result Value Ref Range Status   Specimen Description   Final    BLOOD RIGHT HAND Performed at [facility], [address] [place], [place] [address]    Special Requests   Final     BOTTLES DRAWN AEROBIC AND ANAEROBIC Blood Culture adequate volume Performed at [facility], [address] [place], [place] [address]    Culture  Setup Time   Final    GRAM NEGATIVE RODS AEROBIC BOTTLE ONLY Gram Stain Report Called to,Read Back By and Verified With: EVANS T.,2003,11/30/2020,SELF S CRITICAL RESULT CALLED TO, READ BACK BY AND VERIFIED WITH: S,HURTH PHARMD @1216  12/01/20 EB Performed at [facility], [place] [address], [place],  [address]    Culture ESCHERICHIA COLI (A)  Final   Report Status 12/02/2020 FINAL  Final   Organism ID, Bacteria ESCHERICHIA COLI  Final      Susceptibility   Escherichia coli - MIC*    AMPICILLIN >=32 RESISTANT Resistant     CEFAZOLIN <=4 SENSITIVE Sensitive     CEFEPIME <=0.12 SENSITIVE Sensitive     CEFTAZIDIME <=1 SENSITIVE Sensitive     CEFTRIAXONE <=0.25 SENSITIVE Sensitive     CIPROFLOXACIN <=0.25 SENSITIVE Sensitive     GENTAMICIN >=16 RESISTANT Resistant     IMIPENEM <=0.25 SENSITIVE Sensitive     TRIMETH/SULFA >=320 RESISTANT Resistant     AMPICILLIN/SULBACTAM >=32 RESISTANT Resistant     PIP/TAZO <=4 SENSITIVE Sensitive     * ESCHERICHIA COLI  Culture, blood (Routine X 2) w Reflex to ID Panel     Status: None   Collection Time: 11/30/20  4:53 AM   Specimen: BLOOD RIGHT ARM  Result Value Ref Range Status   Specimen Description BLOOD RIGHT ARM  Final   Special Requests   Final    BOTTLES DRAWN AEROBIC AND ANAEROBIC Blood Culture results may not be optimal due to an excessive volume of blood received  in culture bottles   Culture   Final    NO GROWTH 5 DAYS Performed at [facility], [address] [place], [place] [address]    Report Status 12/05/2020 FINAL  Final  Blood Culture ID Panel (Reflexed)     Status: Abnormal   Collection Time: 11/30/20  4:53 AM  Result Value Ref Range Status   Enterococcus faecalis NOT DETECTED NOT DETECTED Final   Enterococcus Faecium NOT DETECTED NOT DETECTED Final   Listeria  monocytogenes NOT DETECTED NOT DETECTED Final   Staphylococcus species NOT DETECTED NOT DETECTED Final   Staphylococcus aureus (BCID) NOT DETECTED NOT DETECTED Final   Staphylococcus epidermidis NOT DETECTED NOT DETECTED Final   Staphylococcus lugdunensis NOT DETECTED NOT DETECTED Final   Streptococcus species NOT DETECTED NOT DETECTED Final   Streptococcus agalactiae NOT DETECTED NOT DETECTED Final   Streptococcus pneumoniae NOT DETECTED NOT DETECTED Final   Streptococcus pyogenes NOT DETECTED NOT DETECTED Final   A.calcoaceticus-baumannii NOT DETECTED NOT DETECTED Final   Bacteroides fragilis NOT DETECTED NOT DETECTED Final   Enterobacterales DETECTED (A) NOT DETECTED Final    Comment: Enterobacterales represent a large order of gram negative bacteria, not a single organism. CRITICAL RESULT CALLED TO, READ BACK BY AND VERIFIED WITH: S,HURTH PHARMD @1216  12/01/20 [place]    Enterobacter cloacae complex NOT DETECTED NOT DETECTED Final   Escherichia coli DETECTED (A) NOT DETECTED Final    Comment: CRITICAL RESULT CALLED TO, READ BACK BY AND VERIFIED WITH: S,HURTH PHARMD @1216  12/01/20 [place]    Klebsiella aerogenes NOT DETECTED NOT DETECTED Final   Klebsiella oxytoca NOT DETECTED NOT DETECTED Final   Klebsiella pneumoniae NOT DETECTED NOT DETECTED Final   Proteus species NOT DETECTED NOT DETECTED Final   Salmonella species NOT DETECTED NOT DETECTED Final   Serratia marcescens NOT DETECTED NOT DETECTED Final   Haemophilus influenzae NOT DETECTED NOT DETECTED Final   Neisseria meningitidis NOT DETECTED NOT DETECTED Final   Pseudomonas aeruginosa NOT DETECTED NOT DETECTED Final   Stenotrophomonas maltophilia NOT DETECTED NOT DETECTED Final   Candida albicans NOT DETECTED NOT DETECTED Final   Candida auris NOT DETECTED NOT DETECTED Final   Candida glabrata NOT DETECTED NOT DETECTED Final   Candida krusei NOT DETECTED NOT DETECTED Final   Candida parapsilosis NOT DETECTED NOT DETECTED Final    Candida tropicalis NOT DETECTED NOT DETECTED Final   Cryptococcus neoformans/gattii NOT DETECTED NOT DETECTED Final   CTX-M ESBL NOT DETECTED NOT DETECTED Final   Carbapenem resistance IMP NOT DETECTED NOT DETECTED Final   Carbapenem resistance KPC NOT DETECTED NOT DETECTED Final   Carbapenem resistance NDM NOT DETECTED NOT DETECTED Final   Carbapenem resist OXA 48 LIKE NOT DETECTED NOT DETECTED Final   Carbapenem resistance VIM NOT DETECTED NOT DETECTED Final    Comment: Performed at [facility], [address], [place], [place] [address]  Culture, blood (Routine X 2) w Reflex to ID Panel     Status: Abnormal   Collection Time: 12/01/20 12:46 PM   Specimen: BLOOD  Result Value Ref Range Status   Specimen Description   Final    BLOOD DRAWN BY DIALYSIS DRAWN BY RN Performed at [facility], [address], [place], [place] [address]    Special Requests   Final    BOTTLES DRAWN AEROBIC AND ANAEROBIC Blood Culture adequate volume Performed at [facility], [address] [place] [address] [place], [place] [address]    Culture  Setup Time   Final    GRAM POSITIVE  COCCI both anaerobic and aerobic bottles Gram Stain Report Called to,Read Back By and Verified With: Rhew,J@0545  by Matthews,B 10.22.22 Performed at [facility], [address], [place], [place] [address]    Culture (A)  Final    STAPHYLOCOCCUS AUREUS SUSCEPTIBILITIES PERFORMED ON PREVIOUS CULTURE WITHIN THE LAST 5 DAYS. Performed at [facility], [place] [address], [place], [place] [address]    Report Status 12/04/2020 FINAL  Final  Culture, blood (Routine X 2) w Reflex to ID Panel     Status: Abnormal   Collection Time: 12/01/20 12:55 PM   Specimen: BLOOD  Result Value Ref Range Status   Specimen Description   Final    BLOOD DRAWN BY DIALYSIS DRAWN BY RN Performed at [facility], [address] [place], [place] [address]    Special Requests   Final    BOTTLES DRAWN AEROBIC AND ANAEROBIC Blood Culture adequate  volume Performed at [facility], [address], [place], [place] [address]    Culture  Setup Time   Final    GRAM POSITIVE COCCI ANAEROBIC BOTTLE ONLY Gram Stain Report Called to,Read Back By and Verified With: HUMPHRIES @ 1600 ON 102222 BY HENDERSON L CRITICAL VALUE NOTED.  VALUE IS CONSISTENT WITH PREVIOUSLY REPORTED AND CALLED VALUE.    Culture (A)  Final    STAPHYLOCOCCUS AUREUS SUSCEPTIBILITIES PERFORMED ON PREVIOUS CULTURE WITHIN THE LAST 5 DAYS. Performed at [facility], [place] [address], [place], [place] [address]    Report Status 12/04/2020 FINAL  Final  Culture, blood (routine x 2)     Status: Abnormal   Collection Time: 12/03/20  1:03 PM   Specimen: BLOOD RIGHT HAND  Result Value Ref Range Status   Specimen Description   Final    BLOOD RIGHT HAND BOTTLES DRAWN AEROBIC AND ANAEROBIC Performed at [facility], [address], [place], [place] [address]    Special Requests   Final    Blood Culture adequate volume Performed at [facility], [address], [place], [place] [address]    Culture  Setup Time   Final    GRAM NEGATIVE RODS IN BOTH AEROBIC AND ANAEROBIC BOTTLES Gram Stain Report Called to,Read Back By and Verified With: Pura Picinich,b@0520  by matthews, b 10.24.22 CRITICAL VALUE NOTED.  VALUE IS CONSISTENT WITH PREVIOUSLY REPORTED AND CALLED VALUE. Performed at [facility], [address], [place], [place] [address]    Culture (A)  Final    ESCHERICHIA COLI SUSCEPTIBILITIES PERFORMED ON PREVIOUS CULTURE WITHIN THE LAST 5 DAYS. Performed at [facility], [place] [address], [place], [place] [address]    Report Status 12/05/2020 FINAL  Final  Culture, blood (routine x 2)     Status: Abnormal   Collection Time: 12/03/20  1:09 PM   Specimen: Right Antecubital; Blood  Result Value Ref Range Status   Specimen Description   Final    RIGHT ANTECUBITAL BOTTLES DRAWN AEROBIC AND ANAEROBIC Performed at [facility], [address], [place], [place] [address]     Special Requests   Final    Blood Culture adequate volume Performed at [facility], [address], [place], [place] [address]    Culture  Setup Time   Final    GRAM POSITIVE COCCI ANAEROBIC BOTTLE ONLY GRAM NEGATIVE RODS AEROBIC BOTTLE ONLY Gram Stain Report Called to,Read Back By and Verified With: BOTH ORGANISMS, MORRIS,C AT 0940 ON 10.24.22 BY RUCINSKI,B CRITICAL VALUE NOTED.  VALUE IS CONSISTENT WITH PREVIOUSLY REPORTED AND CALLED VALUE.  Culture (A)  Final    STAPHYLOCOCCUS AUREUS ESCHERICHIA COLI SUSCEPTIBILITIES PERFORMED ON PREVIOUS CULTURE WITHIN THE LAST 5 DAYS. Performed at [facility], [place] [address], [place], [place] [address]    Report Status 12/06/2020 FINAL  Final  Culture, blood (Routine X 2) w Reflex to ID Panel     Status: None (Preliminary result)   Collection Time: 12/05/20  5:29 AM   Specimen: BLOOD RIGHT HAND  Result Value Ref Range Status   Specimen Description BLOOD RIGHT HAND  Final   Special Requests   Final    BOTTLES DRAWN AEROBIC ONLY Blood Culture results may not be optimal due to an inadequate volume of blood received in culture bottles   Culture   Final    NO GROWTH 2 DAYS Performed at [facility], [address] [place], [place] [address]    Report Status PENDING  Incomplete  Culture, blood (Routine X 2) w Reflex to ID Panel     Status: None (Preliminary result)   Collection Time: 12/05/20  5:29 AM   Specimen: BLOOD RIGHT ARM  Result Value Ref Range Status   Specimen Description BLOOD RIGHT ARM  Final   Special Requests   Final    BOTTLES DRAWN AEROBIC AND ANAEROBIC Blood Culture results may not be optimal due to an excessive volume of blood received in culture bottles   Culture   Final    NO GROWTH 2 DAYS Performed at [facility], [address] [place], [place] [address]    Report Status PENDING  Incomplete     Scheduled Meds:  apixaban  5 mg Oral BID   calcitRIOL  0.5 mcg Oral Q M,W,F   calcium acetate  1,334 mg Oral TID  with meals   Chlorhexidine Gluconate Cloth  6 each Topical Q0600   [START ON 11/23/2020] Chlorhexidine Gluconate Cloth  6 each Topical Q0600   insulin aspart  0-6 Units Subcutaneous TID WC   insulin aspart  2 Units Subcutaneous TID WC   loratadine  10 mg Oral q1800   mouth rinse  15 mL Mouth Rinse BID   mupirocin ointment   Nasal BID   pantoprazole  40 mg Oral Daily   silver sulfADIAZINE   Topical BID   Continuous Infusions:   ceFAZolin (ANCEF) IV 2 g (12/06/20 1730)   vancomycin 1,250 mg (12/06/20 1640)    Procedures/Studies: DG Elbow 2 Views Right  Result Date: 11/29/2020 CLINICAL DATA:  Pain and swelling in the right elbow lung recent fall, initial encounter EXAM: RIGHT ELBOW - 2 VIEW COMPARISON:  None. FINDINGS: Mild degenerative changes of the right elbow joint are seen. No acute fracture or dislocation is noted. Small joint effusion is noted anteriorly. Olecranon spurring is seen. IMPRESSION: Small joint effusion without acute fracture. Electronically Signed   By: Inez Catalina M.D.   On: 11/29/2020 18:58   MR LUMBAR SPINE WO CONTRAST  Result Date: 12/05/2020 CLINICAL DATA:  Low back pain, infection suspected low back pain. Recurrent MRSA bacteremia EXAM: MRI LUMBAR SPINE WITHOUT CONTRAST TECHNIQUE: Multiplanar, multisequence MR imaging of the lumbar spine was performed. No intravenous contrast was administered. COMPARISON:  None. FINDINGS: Motion artifact is present. Segmentation: Standard. Alignment:  No significant listhesis. Vertebrae: Mildly decreased T1 marrow signal may reflect hematopoietic marrow. No substantial marrow edema. No suspicious osseous lesion Conus medullaris and cauda equina: Conus extends to the L1 level. Conus and cauda equina appear normal. Paraspinal and other soft tissues: Unremarkable. Disc levels: L1-L2:  No canal or foraminal stenosis. L2-L3: Disc bulge. Facet arthropathy with ligamentum flavum infolding. No canal stenosis. Mild foraminal stenosis. L3-L4:  Level was not included on axial imaging. On sagittal imaging, there is a disc bulge and central annular fissure as well as facet arthropathy. Likely at least mild canal stenosis. Mild to moderate right and mild left foraminal stenosis. L4-L5: Disc bulge. Marked facet arthropathy with ligamentum flavum infolding and joint effusions with extra-spinal synovial cyst formation. Marked canal stenosis with effacement of subarticular recesses. Mild to moderate foraminal stenosis. L5-S1: Disc bulge with small central annular fissure. No canal or foraminal stenosis. Signal within the right lateral epidural space may reflect traversing vessels. IMPRESSION: Degraded by motion.  No evidence of discitis/osteomyelitis. Multilevel degenerative changes as detailed above. There is marked canal stenosis at L4-L5 with effacement of subarticular recesses. Facet arthropathy is also greatest at this level. The L3-L4 level was not included on axial imaging due to technical error. There is likely at least mild canal stenosis, mild to moderate right foraminal stenosis, and mild left foraminal stenosis. Facet arthropathy is present at this level. If desired, patient can be brought back for repeat scanning without charge. Electronically Signed   By: Macy Mis M.D.   On: 12/05/2020 17:26   US Abdomen Complete  Result Date: 12/04/2020 CLINICAL DATA:  Elevated LFT EXAM: ABDOMEN ULTRASOUND COMPLETE COMPARISON:  Renal ultrasound 04/18/2017 FINDINGS: Gallbladder: No shadowing stones. Small fluid around the gallbladder. Negative sonographic Murphy. Increased wall thickness of 5.2 mm. Common bile duct: Diameter: 4.8 mm Liver: No focal lesion identified. Within normal limits in parenchymal echogenicity. Portal vein is patent on color Doppler imaging with normal direction of blood flow towards the liver. IVC: No abnormality visualized. Pancreas: Obscured by bowel gas Spleen: Slightly enlarged with volume of 526 mL. Right Kidney: Length: 12.3  cm.  Lower pole cyst measuring 2.2 cm. Left Kidney: Length: 13.3 cm. Echogenicity within normal limits. No mass or hydronephrosis visualized. Abdominal aorta: No aneurysm visualized. Other findings: Incidental bilateral pleural effusions. Small amount of ascites within the abdomen. IMPRESSION: 1. Negative for gallstones. Slight increased gallbladder wall thickness but negative sonographic Percell Miller, findings are nonspecific. There is a small amount of fluid adjacent to the gallbladder likely related to generalized small ascites in the right upper quadrant. 2. Liver grossly within normal limits. 3. Mild splenomegaly 4. Small amount of ascites within the upper and lower abdomen. 5. Bilateral pleural effusions Electronically Signed   By: Donavan Foil M.D.   On: 12/04/2020 16:00   MR FOOT RIGHT WO CONTRAST  Result Date: 12/01/2020 CLINICAL DATA:  Right foot ulcer. EXAM: MRI OF THE RIGHT FOREFOOT WITHOUT CONTRAST TECHNIQUE: Multiplanar, multisequence MR imaging of the right forefoot was performed. No intravenous contrast was administered. COMPARISON:  Right foot x-rays dated November 27, 2020. FINDINGS: Bones/Joint/Cartilage Faint marrow edema the fifth metatarsal head. Remaining marrow signal is normal. No fracture or dislocation. Mild second TMT joint osteoarthritis. No joint effusion. Ligaments Collateral ligaments are intact.  Lisfranc ligament is intact. Muscles and Tendons Flexor and extensor tendons are intact. Increased T2 signal within the intrinsic muscles of the forefoot, nonspecific, but likely related to diabetic muscle changes. Soft tissue Diffuse soft tissue swelling. Soft tissue ulceration along the lateral forefoot near the fifth metatarsal head with underlying 2.1 x 0.4 x 2.3 cm curvilinear fluid collection (series 8, image 26). No soft tissue mass. IMPRESSION: 1. Soft tissue ulceration along the lateral forefoot near the fifth metatarsal head with underlying 2.3 cm curvilinear  fluid collection,  concerning for abscess. 2. Faint marrow edema in the fifth metatarsal head is nonspecific, but concerning for early osteomyelitis given adjacent abscess and soft tissue ulcer. Electronically Signed   By: Titus Dubin M.D.   On: 12/01/2020 14:16   DG Chest Port 1 View  Result Date: 11/29/2020 CLINICAL DATA:  Removal of Port-A-Cath EXAM: PORTABLE CHEST 1 VIEW COMPARISON:  11/27/2020 FINDINGS: There is been interval removal of hemodialysis catheter on the right. No visible retained fragments. Prior CABG. Cardiomegaly. Vascular congestion. Bilateral layering effusions and lower lobe airspace opacity. Probable mild edema/CHF. IMPRESSION: Interval removal of right hemodialysis catheter. CHF.  Layering effusions. Electronically Signed   By: Rolm Baptise M.D.   On: 11/29/2020 16:58   DG Chest Port 1 View  Result Date: 11/27/2020 CLINICAL DATA:  Fall.  Chest and right foot pain. EXAM: PORTABLE CHEST 1 VIEW COMPARISON:  09/01/2020 FINDINGS: Unchanged mild cardiomegaly. Unchanged mild pulmonary vascular congestion. Small bilateral pleural effusions are not significantly changed since prior examination. Interval placement of dialysis catheter. Median sternotomy changes again noted. IMPRESSION: No acute abnormality of the chest. Unchanged findings of mild CHF/fluid volume overload. Electronically Signed   By: Miachel Roux M.D.   On: 11/27/2020 11:24   DG Foot 2 Views Right  Result Date: 11/27/2020 CLINICAL DATA:  wound to lateral fore foot EXAM: RIGHT FOOT - 2 VIEW COMPARISON:  Radiograph 08/18/2020 FINDINGS: There is no evidence of acute fracture or dislocation. Unchanged bulky ossification along the base of the fifth metatarsal, possibly from prior trauma. There is a lateral forefoot soft tissue defect. There is no frank bony destruction radiographically. Mild midfoot and forefoot degenerative changes. Plantar and dorsal calcaneal spurring. Vascular calcifications. IMPRESSION: Lateral forefoot soft tissue  defect. No frank bony destruction radiographically to suggest osteomyelitis. MRI would be more sensitive. Electronically Signed   By: Maurine Simmering M.D.   On: 11/27/2020 11:32   ECHO TEE  Result Date: 12/05/2020    TRANSESOPHOGEAL ECHO REPORT   Patient Name:   Travis Blair [facility] Date of Exam: 12/05/2020 Medical Rec #:  [id number]           Height:       74.0 in Accession #:    [id number]          Weight:       252.6 lb Date of Birth:  1950/05/11           BSA:          2.400 m Patient Age:    49 years            BP:           132/59 mmHg Patient Gender: M                   HR:           78 bpm. Exam Location:  [facility] Procedure: Transesophageal Echo, Color Doppler and Cardiac Doppler Indications:    Bacteremia R78.81  History:        Patient has prior history of Echocardiogram examinations, most                 recent 11/28/2020. CAD, Prior CABG, Arrythmias:Atrial                 Fibrillation; Risk Factors:Hypertension, Diabetes and                 Dyslipidemia. ESRD (end stage renal disease).  Sonographer:  Alvino Chapel RCS Referring Phys: [id number] PAULA V ROSS PROCEDURE: The transesophogeal probe was passed without difficulty through the esophogus of the patient. Sedation performed by different physician. The patient developed no complications during the procedure. IMPRESSIONS  1. Left ventricular ejection fraction, by estimation, is 50 to 55%. The left ventricle has low normal function.  2. Right ventricular systolic function is low normal. The right ventricular size is moderately enlarged.  3. Left atrial size was severely dilated. No left atrial/left atrial appendage thrombus was detected. The LAA emptying velocity was 65 cm/s.  4. Right atrial size was severely dilated.  5. The mitral valve is normal in structure. Mild mitral valve regurgitation. No evidence of mitral stenosis.  6. The tricuspid valve is abnormal. Tricuspid valve regurgitation is moderate.  7. The aortic valve is tricuspid. There is mild  calcification of the aortic valve. Aortic valve regurgitation is not visualized. No aortic stenosis is present. Conclusion(s)/Recommendation(s): No evidence of vegetation/infective endocarditis on this transesophageal echocardiogram. FINDINGS  Left Ventricle: Left ventricular ejection fraction, by estimation, is 50 to 55%. The left ventricle has low normal function. The left ventricular internal cavity size was normal in size. Right Ventricle: The right ventricular size is moderately enlarged. Right vetricular wall thickness was not assessed. Right ventricular systolic function is low normal. Left Atrium: Left atrial size was severely dilated. No left atrial/left atrial appendage thrombus was detected. The LAA emptying velocity was 65 cm/s. Right Atrium: Right atrial size was severely dilated. Pericardium: There is no evidence of pericardial effusion. Mitral Valve: The mitral valve is normal in structure. Mild mitral valve regurgitation. No evidence of mitral valve stenosis. Tricuspid Valve: The tricuspid valve is abnormal. Tricuspid valve regurgitation is moderate . No evidence of tricuspid stenosis. Aortic Valve: The aortic valve is tricuspid. There is mild calcification of the aortic valve. Aortic valve regurgitation is not visualized. No aortic stenosis is present. Pulmonic Valve: The pulmonic valve was not well visualized. Pulmonic valve regurgitation is trivial. No evidence of pulmonic stenosis. Aorta: The aortic root is normal in size and structure. There is minimal (Grade I) plaque. IAS/Shunts: No atrial level shunt detected by color flow Doppler. Carlyle Dolly MD Electronically signed by Carlyle Dolly MD Signature Date/Time: 12/05/2020/4:24:16 PM    Final    ECHOCARDIOGRAM LIMITED  Result Date: 11/28/2020    ECHOCARDIOGRAM LIMITED REPORT   Patient Name:   Travis Blair [facility] Date of Exam: 11/28/2020 Medical Rec #:  [id number]           Height:       74.0 in Accession #:    [id number]           Weight:       261.9 lb Date of Birth:  [birth year]           BSA:          2.437 m Patient Age:    38 years            BP:           124/56 mmHg Patient Gender: M                   HR:           72 bpm. Exam Location:  [facility] Procedure: Limited Echo, Color Doppler and Cardiac Doppler Indications:    Dyspnea R06.00  History:        Patient has prior history of Echocardiogram examinations, most  recent 06/06/2020. CAD, Prior CABG, Arrythmias:Atrial                 Fibrillation, Signs/Symptoms:Shortness of Breath; Risk                 Factors:Hypertension, Diabetes and Dyslipidemia.  Sonographer:    Alvino Chapel RCS Referring Phys: [id number] [place]  1. Left ventricular ejection fraction, by estimation, is 45 to 50%. The left ventricle has mildly decreased function. The left ventricle demonstrates regional wall motion abnormalities (see scoring diagram/findings for description). There is severe asymmetric left ventricular hypertrophy of the basal and septal segments. Left ventricular diastolic parameters are indeterminate. There is the interventricular septum is flattened in systole and diastole, consistent with right ventricular pressure and volume overload.  2. Right ventricular systolic function is low normal. The right ventricular size is mildly enlarged. There is moderately elevated pulmonary artery systolic pressure. The estimated right ventricular systolic pressure is 77.9 mmHg.  3. Left atrial size was mildly dilated.  4. Right atrial size was mildly dilated.  5. Left pleural effusion evident. A small pericardial effusion is present. The pericardial effusion is posterior to the left ventricle.  6. The mitral valve is abnormal. Mild mitral valve regurgitation.  7. The aortic valve is tricuspid. There is moderate calcification of the aortic valve. Aortic valve regurgitation is not visualized.  8. The inferior vena cava is dilated in size with <50% respiratory  variability, suggesting right atrial pressure of 15 mmHg. Comparison(s): Prior images reviewed side by side. Reduced LVEF and wall motion abnormalities are not new based on my review of the prior study. FINDINGS  Left Ventricle: Left ventricular ejection fraction, by estimation, is 45 to 50%. The left ventricle has mildly decreased function. The left ventricle demonstrates regional wall motion abnormalities. Definity contrast agent was given IV to delineate the left ventricular endocardial borders. There is severe asymmetric left ventricular hypertrophy of the basal and septal segments. The interventricular septum is flattened in systole and diastole, consistent with right ventricular pressure and volume overload. Left ventricular diastolic parameters are indeterminate. Left ventricular diastolic function could not be evaluated due to atrial fibrillation.  LV Wall Scoring: The basal inferior segment is akinetic. The posterior wall, mid inferoseptal segment, mid inferior segment, and basal inferoseptal segment are hypokinetic. The entire anterior wall, antero-lateral wall, entire anterior septum, and entire apex are normal. Right Ventricle: The right ventricular size is mildly enlarged. No increase in right ventricular wall thickness. Right ventricular systolic function is low normal. There is moderately elevated pulmonary artery systolic pressure. The tricuspid regurgitant  velocity is 3.12 m/s, and with an assumed right atrial pressure of 15 mmHg, the estimated right ventricular systolic pressure is 39.0 mmHg. Left Atrium: Left atrial size was mildly dilated. Right Atrium: Right atrial size was mildly dilated. Pericardium: Left pleural effusion evident. A small pericardial effusion is present. The pericardial effusion is posterior to the left ventricle. Mitral Valve: The mitral valve is abnormal. There is mild thickening of the mitral valve leaflet(s). There is mild calcification of the mitral valve leaflet(s).  Mild mitral valve regurgitation. Tricuspid Valve: The tricuspid valve is grossly normal. Tricuspid valve regurgitation is mild. Aortic Valve: The aortic valve is tricuspid. There is moderate calcification of the aortic valve. There is mild to moderate aortic valve annular calcification. Aortic valve regurgitation is not visualized. Aorta: The aortic root is normal in size and structure. Venous: The inferior vena cava is dilated in size with less than 50% respiratory  variability, suggesting right atrial pressure of 15 mmHg. IAS/Shunts: No atrial level shunt detected by color flow Doppler. LEFT VENTRICLE PLAX 2D LVIDd:         5.50 cm LVIDs:         4.30 cm LV PW:         1.30 cm LV IVS:        1.80 cm LVOT diam:     2.30 cm LVOT Area:     4.15 cm  RIGHT VENTRICLE TAPSE (M-mode): 1.7 cm LEFT ATRIUM              Index        RIGHT ATRIUM           Index LA diam:        4.20 cm  1.72 cm/m   RA Area:     27.30 cm LA Vol (A2C):   102.0 ml 41.85 ml/m  RA Volume:   96.50 ml  39.59 ml/m LA Vol (A4C):   74.5 ml  30.57 ml/m LA Biplane Vol: 91.8 ml  37.66 ml/m   AORTA Ao Root diam: 3.80 cm MITRAL VALVE                TRICUSPID VALVE MV Area (PHT): 5.02 cm     TR Peak grad:   38.9 mmHg MV Decel Time: 151 msec     TR Vmax:        312.00 cm/s MV E velocity: 143.00 cm/s                             SHUNTS                             Systemic Diam: 2.30 cm Rozann Lesches MD Electronically signed by Rozann Lesches MD Signature Date/Time: 11/28/2020/1:03:27 PM    Final     Irwin Brakeman, MD   [facility]  If 7PM-7AM, please contact night-coverage [email or web address] Password [email or web address] 12/07/2020, 5:26 PM   LOS: 10 days

## 2020-12-07 NOTE — Telephone Encounter (Signed)
Received completed forms from provider.   No charge per provider. Patient wife contacted to pick up form at front desk.

## 2020-12-07 NOTE — Care Management Important Message (Signed)
Important Message  Patient Details  Name: Travis Blair MRN: [id number] Date of Birth: Sep 01, 1950   Medicare Important Message Given:  Yes     Tommy Medal 12/07/2020, 1:57 PM

## 2020-12-07 NOTE — Plan of Care (Signed)
  Problem: Clinical Measurements: Goal: Will remain free from infection Outcome: Progressing   Problem: Pain Managment: Goal: General experience of comfort will improve Outcome: Progressing   Problem: Safety: Goal: Ability to remain free from injury will improve Outcome: Progressing   

## 2020-12-07 NOTE — Progress Notes (Signed)
Physical Therapy Treatment Patient Details Name: Travis Blair MRN: [id number] DOB: 1950-07-02 Today's Date: 12/07/2020   History of Present Illness KALIEB FREELAND is a 70 y.o. male with medical history significant of CHF, ERSD on HD (M-W-F), CAD, DVT/PE, and T2DM, CABG with post operative atrial fibrillation, pulmonary hypertension who presented with dyspnea.   Patient reported having cough for about 2 weeks, persistent and worsening.  Tried several antitussive agents at home without improvement of symptoms.  He went to dialysis on Friday, 3 days ago, yesterday he was noted to be dyspneic by his wife, moderate in intensity, associated with cough but no chest pain.  Last night he was noted to have poor balance, and he declined to be brought to the emergency department.  This morning he fell while in the bathroom, landed between the commode and the wall, EMS was called and patient was brought to the hospital.     His main symptom seems to be increased work of breathing/dyspnea, moderate to severe in intensity, worse with exertion, associated with cough, no chest pain, no improving factors.  Last night not able to sleep due to persistent cough, dry.   He continues to make urine 3 times daily.     In June 2022 he decided to stop apixaban due to weakness, he was placed on clopidogrel.  Last year he had COVID-pneumonia and pulm embolism.  About 3 months ago he was started on hemodialysis, he has a fistula which is not mature yet.  He uses a tunneled dialysis catheter for dialysis.    PT Comments    Patient demonstrates slow labored movement for sitting up at bedside requiring HOB raised and use of side rail due to generalized weakness, has most difficulty completing sit to stand due to posterior leaning and BLE weakness requiring repeated verbal/tactile cueing to flex trunk and multiple attempts before able to sit to stand and transfer to chair.  Patient tolerated sitting up in chair after therapy  with his family member present in room.  Patient will benefit from continued physical therapy in hospital and recommended venue below to increase strength, balance, endurance for safe ADLs and gait.      Recommendations for follow up therapy are one component of a multi-disciplinary discharge planning process, led by the attending physician.  Recommendations may be updated based on patient status, additional functional criteria and insurance authorization.  Follow Up Recommendations  Skilled nursing-short term rehab (<3 hours/day)     Assistance Recommended at Discharge Intermittent Supervision/Assistance  Equipment Recommendations  None recommended by PT    Recommendations for Other Services       Precautions / Restrictions Precautions Precautions: Fall Restrictions Weight Bearing Restrictions: No     Mobility  Bed Mobility Overal bed mobility: Needs Assistance Bed Mobility: Supine to Sit Rolling: Mod assist         General bed mobility comments: increased time, labored movement    Transfers Overall transfer level: Needs assistance Equipment used: Rolling walker (2 wheels) Transfers: Sit to/from [employer] Transfers Sit to Stand: Mod assist;Max assist Stand pivot transfers: Mod assist         General transfer comment: has difficulty with sit to stands due to BLE weakness and posterior leaning requiring repeated vc's to lean forward    Ambulation/Gait Ambulation/Gait assistance: Mod assist Gait Distance (Feet): 5 Feet Assistive device: Rolling walker (2 wheels) Gait Pattern/deviations: Decreased step length - right;Decreased step length - left;Decreased stride length Gait velocity: Decreased   General  Gait Details: limited to a few slow labored unsteady side steps due to BLE weakness and poor standing balance   Stairs             Wheelchair Mobility    Modified Rankin (Stroke Patients Only)       Balance Overall balance assessment:  Needs assistance Sitting-balance support: Feet supported;No upper extremity supported Sitting balance-Leahy Scale: Fair Sitting balance - Comments: fair/good seated at EOB   Standing balance support: Reliant on assistive device for balance;During functional activity;Bilateral upper extremity supported Standing balance-Leahy Scale: Poor Standing balance comment: poor using RW                            Cognition Arousal/Alertness: Awake/alert Behavior During Therapy: WFL for tasks assessed/performed Overall Cognitive Status: Within Functional Limits for tasks assessed                                          Exercises General Exercises - Lower Extremity Long Arc Quad: Seated;AROM;Strengthening;Both;10 reps Hip Flexion/Marching: Seated;AROM;Strengthening;Both;10 reps Toe Raises: Seated;AROM;Strengthening;Both;10 reps Heel Raises: Seated;AROM;Strengthening;Both;10 reps    General Comments        Pertinent Vitals/Pain Pain Assessment: Faces Faces Pain Scale: Hurts a little bit Pain Location: low back Pain Descriptors / Indicators: Sore Pain Intervention(s): Limited activity within patient's tolerance;Monitored during session;Repositioned    Home Living                          Prior Function            PT Goals (current goals can now be found in the care plan section) Acute Rehab PT Goals Patient Stated Goal: Pt open to rehab. PT Goal Formulation: With patient/family Time For Goal Achievement: 12/13/20 Potential to Achieve Goals: Good Progress towards PT goals: Progressing toward goals    Frequency    Min 3X/week      PT Plan Current plan remains appropriate    Co-evaluation              AM-PAC PT "6 Clicks" Mobility   Outcome Measure  Help needed turning from your back to your side while in a flat bed without using bedrails?: A Lot Help needed moving from lying on your back to sitting on the side of a flat bed  without using bedrails?: A Lot Help needed moving to and from a bed to a chair (including a wheelchair)?: A Lot Help needed standing up from a chair using your arms (e.g., wheelchair or bedside chair)?: A Lot Help needed to walk in hospital room?: A Lot Help needed climbing 3-5 steps with a railing? : Total 6 Click Score: 11    End of Session   Activity Tolerance: Patient tolerated treatment well;Patient limited by fatigue Patient left: in chair;with call bell/phone within reach;with family/visitor present Nurse Communication: Mobility status PT Visit Diagnosis: Unsteadiness on feet (R26.81);Other abnormalities of gait and mobility (R26.89);Muscle weakness (generalized) (M62.81)     Time: 7867-6720 PT Time Calculation (min) (ACUTE ONLY): 28 min  Charges:  $Therapeutic Exercise: 8-22 mins $Therapeutic Activity: 8-22 mins                     2:29 PM, 12/07/20 Lonell Grandchild, MPT Physical Therapist with [facility] [phone] office [phone] mobile phone

## 2020-12-07 NOTE — TOC Progression Note (Signed)
Transition of Care [facility]) - Progression Note    Patient Details  Name: Travis Blair MRN: [id number] Date of Birth: May 02, 1950  Transition of [facility]) CM/SW Contact  Shade Flood, LCSW Phone Number: 12/07/2020, 3:41 PM  Clinical Narrative:     TOC following. Started insurance auth this AM and do not yet have approval for SNF at [facility]. Per Marianna Fuss, they can admit pt tomorrow if Josem Kaufmann comes through. Updated pt's wife.  TOC will follow.  Expected Discharge Plan: [place] Barriers to Discharge: Continued Medical Work up  Expected Discharge Plan and Services Expected Discharge Plan: [place] In-house Referral: Clinical Social Work   Post Acute Care Choice: [place] arrangements for the past 2 months: Single Family Home                                       Social Determinants of Health (SDOH) Interventions    Readmission Risk Interventions Readmission Risk Prevention Plan 11/28/2020 11/28/2020 09/08/2020  Transportation Screening - Complete Complete  PCP or Specialist Appt within 5-7 Days - - Complete  Home Care Screening - - Complete  Medication Review (RN CM) - - Complete  HRI or [place] - Complete -  Social Work Consult for [place] Planning/Counseling Complete - -  Palliative Care Screening - Not Applicable -  Medication Review (RN Care Manager) - Complete -  Some recent data might be hidden

## 2020-12-07 NOTE — Progress Notes (Signed)
[facility] ROUNDING NOTE   Subjective:   Interval History: ESRD  HD dep MWF  ,  history of CAD and pulmonary HTN  DVT / PE  admitted with  acute hypoxic respiratory failure from vol overload and diastolic dysfunction.  Last dialysis 10/26  2.6 L removed  BP 101/57  P 97.6   Sats room air 100%  Na 127  K 4.1   Cl 91  CO 2 25  Cr 7.38  Glc 244  BUN 84   Hb 9.7    Objective:  Vital signs in last 24 hours:  Temp:  [97.3 F (36.3 C)-98.2 F (36.8 C)] 97.3 F (36.3 C) (10/27 0622) Pulse Rate:  [67-84] 77 (10/27 0622) Resp:  [18-19] 19 (10/27 0622) BP: (101-133)/(50-70) 101/57 (10/27 0622) SpO2:  [95 %-96 %] 95 % (10/26 2031) Weight:  [116.6 kg] 116.6 kg (10/26 1350)  Weight change:  Filed Weights   12/04/20 0600 12/04/20 0850 12/06/20 1350  Weight: 114.6 kg 114.6 kg 116.6 kg    Intake/Output: I/O last 3 completed shifts: In: 2237.9 [P.O.:240; I.V.:1647.9; IV Piggyback:350] Out: 2812 [Urine:200; Other:2612]   Intake/Output this shift:  Total I/O In: 240 [P.O.:240] Out: -   CVS- RRR RS- CTA ABD- BS present soft non-distended EXT- no edema   Basic Metabolic Panel: Recent Labs  Lab 12/01/20 0538 12/04/20 0341 12/05/20 0757 12/06/20 1406  NA 130* 129* 129* 127*  K 4.3 4.2 4.0 4.1  CL 93* 91* 92* 91*  CO2 24 23 26 25   GLUCOSE 162* 283* 237* 244*  BUN 69* 97* 71* 86*  CREATININE 6.35* 7.55* 6.28* 7.38*  CALCIUM 8.4* 8.1* 8.0* 8.3*  PHOS 3.9  --   --  5.7*    Liver Function Tests: Recent Labs  Lab 12/01/20 0538 12/02/20 0435 12/04/20 0341 12/05/20 0757 12/06/20 1406  AST  --  347* 138* 88*  --   ALT  --  130* 180* 141*  --   ALKPHOS  --  174* 194* 184*  --   BILITOT  --  1.6* 0.8 0.8  --   PROT  --  6.9 6.0* 6.5  --   ALBUMIN 2.5* 3.1* 2.6* 2.6* 2.4*   No results for input(s): LIPASE, AMYLASE in the last 168 hours. No results for input(s): AMMONIA in the last 168 hours.  CBC: Recent Labs  Lab 12/03/20 0447 12/04/20 0341  12/05/20 0757 12/06/20 0558 12/07/20 0518  WBC 12.0* 13.9* 16.0* 15.6* 16.2*  HGB 9.4* 9.7* 10.5* 9.4* 9.7*  HCT 31.7* 31.5* 34.0* 31.0* 32.6*  MCV 91.1 89.0 89.5 90.1 89.6  PLT 207 214 226 255 238    Cardiac Enzymes: No results for input(s): CKTOTAL, CKMB, CKMBINDEX, TROPONINI in the last 168 hours.  BNP: Invalid input(s): POCBNP  CBG: Recent Labs  Lab 12/02/20 1419 12/02/20 1544 12/05/20 1356 12/07/20 0911 12/07/20 1101  GLUCAP 137* 135* 181* 268* 219*    Microbiology: Results for orders placed or performed during the hospital encounter of 11/27/20  Resp Panel by RT-PCR (Flu A&B, Covid) Nasopharyngeal Swab     Status: None   Collection Time: 11/27/20 10:09 AM   Specimen: Nasopharyngeal Swab; Nasopharyngeal(NP) swabs in vial transport medium  Result Value Ref Range Status   SARS Coronavirus 2 by RT PCR NEGATIVE NEGATIVE Final    Comment: (NOTE) SARS-CoV-2 target nucleic acids are NOT DETECTED.  The SARS-CoV-2 RNA is generally detectable in upper respiratory specimens during the acute phase of infection. The lowest concentration of SARS-CoV-2  viral copies this assay can detect is 138 copies/mL. A negative result does not preclude SARS-Cov-2 infection and should not be used as the sole basis for treatment or other patient management decisions. A negative result may occur with  improper specimen collection/handling, submission of specimen other than nasopharyngeal swab, presence of viral mutation(s) within the areas targeted by this assay, and inadequate number of viral copies(<138 copies/mL). A negative result must be combined with clinical observations, patient history, and epidemiological information. The expected result is Negative.  Fact Sheet for Patients:  EntrepreneurPulse.com.au  Fact Sheet for Healthcare Providers:  IncredibleEmployment.be  This test is no t yet approved or cleared by the [place] FDA and  has  been authorized for detection and/or diagnosis of SARS-CoV-2 by FDA under an Emergency Use Authorization (EUA). This EUA will remain  in effect (meaning this test can be used) for the duration of the COVID-19 declaration under Section 564(b)(1) of the Act, 21 U.S.C.section 360bbb-3(b)(1), unless the authorization is terminated  or revoked sooner.       Influenza A by PCR NEGATIVE NEGATIVE Final   Influenza B by PCR NEGATIVE NEGATIVE Final    Comment: (NOTE) The Xpert Xpress SARS-CoV-2/FLU/RSV plus assay is intended as an aid in the diagnosis of influenza from Nasopharyngeal swab specimens and should not be used as a sole basis for treatment. Nasal washings and aspirates are unacceptable for Xpert Xpress SARS-CoV-2/FLU/RSV testing.  Fact Sheet for Patients: EntrepreneurPulse.com.au  Fact Sheet for Healthcare Providers: IncredibleEmployment.be  This test is not yet approved or cleared by the [place] FDA and has been authorized for detection and/or diagnosis of SARS-CoV-2 by FDA under an Emergency Use Authorization (EUA). This EUA will remain in effect (meaning this test can be used) for the duration of the COVID-19 declaration under Section 564(b)(1) of the Act, 21 U.S.C. section 360bbb-3(b)(1), unless the authorization is terminated or revoked.  Performed at [facility], [address], [place], [place] [address]   MRSA Next Gen by PCR, Nasal     Status: Abnormal   Collection Time: 11/27/20  4:10 PM   Specimen: Nasal Mucosa; Nasal Swab  Result Value Ref Range Status   MRSA by PCR Next Gen DETECTED (A) NOT DETECTED Final    Comment: RESULT CALLED TO, READ BACK BY AND VERIFIED WITH: HEATHER TETREAULT,1951,11/27/2020,SELF S. (NOTE) The GeneXpert MRSA Assay (FDA approved for NASAL specimens only), is one component of a comprehensive MRSA colonization surveillance program. It is not intended to diagnose MRSA infection nor to guide or  monitor treatment for MRSA infections. Test performance is not FDA approved in patients less than 84 years old. Performed at [facility], [address], [place], [place] [address]   Culture, blood (Routine X 2) w Reflex to ID Panel     Status: Abnormal   Collection Time: 11/28/20  9:34 AM   Specimen: BLOOD RIGHT HAND  Result Value Ref Range Status   Specimen Description   Final    BLOOD RIGHT HAND Performed at [facility], [address], [place], [place] [address]    Special Requests   Final    Immunocompromised Blood Culture adequate volume BOTTLES DRAWN AEROBIC AND ANAEROBIC Performed at [facility], [address] [place] [address] [place], [place] [address]    Culture  Setup Time   Final    GRAM POSITIVE COCCI IN BOTH AEROBIC AND ANAEROBIC BOTTLES Gram Stain Report Called to,Read Back By and Verified WithLynwood Dawley, 2216, 11/28/2020,SELF S aerobic bottle previously called 10.18.22 2353  matthews, b  Performed at [facility], [address], [place], [place] [address]    Culture (A)  Final    STAPHYLOCOCCUS AUREUS SUSCEPTIBILITIES PERFORMED ON PREVIOUS CULTURE WITHIN THE LAST 5 DAYS. Performed at [facility], [place] [address], [place], [place] [address]    Report Status 12/02/2020 FINAL  Final  Culture, blood (Routine X 2) w Reflex to ID Panel     Status: Abnormal   Collection Time: 11/28/20  9:34 AM   Specimen: BLOOD RIGHT FOREARM  Result Value Ref Range Status   Specimen Description   Final    BLOOD RIGHT FOREARM Performed at [facility], [address], [place], [place] [address]    Special Requests   Final    Immunocompromised Blood Culture adequate volume BOTTLES DRAWN AEROBIC AND ANAEROBIC Performed at [facility], [address] [place], [place] [address]    Culture  Setup Time   Final    GRAM POSITIVE COCCI IN BOTH AEROBIC AND ANAEROBIC BOTTLES Gram Stain Report Called to,Read Back By and Verified With: T.ALLEN,2236,11/28/2020,SELF S aerobic bottle gpc previously  called matthews, b 10.18.22 2355 CRITICAL RESULT CALLED TO, READ BACK BY AND VERIFIED WITH: T ALLEN,RN@0209  11/29/20 [place]    Culture (A)  Final    METHICILLIN RESISTANT STAPHYLOCOCCUS AUREUS VIRIDANS STREPTOCOCCUS THE SIGNIFICANCE OF ISOLATING THIS ORGANISM FROM A SINGLE SET OF BLOOD CULTURES WHEN MULTIPLE SETS ARE DRAWN IS UNCERTAIN. PLEASE NOTIFY THE MICROBIOLOGY DEPARTMENT WITHIN ONE WEEK IF SPECIATION AND SENSITIVITIES ARE REQUIRED. Performed at [facility], [place] [address], [place],  [address]    Report Status 12/02/2020 FINAL  Final   Organism ID, Bacteria METHICILLIN RESISTANT STAPHYLOCOCCUS AUREUS  Final      Susceptibility   Methicillin resistant staphylococcus aureus - MIC*    CIPROFLOXACIN >=8 RESISTANT Resistant     ERYTHROMYCIN >=8 RESISTANT Resistant     GENTAMICIN <=0.5 SENSITIVE Sensitive     OXACILLIN >=4 RESISTANT Resistant     TETRACYCLINE <=1 SENSITIVE Sensitive     VANCOMYCIN 1 SENSITIVE Sensitive     TRIMETH/SULFA <=10 SENSITIVE Sensitive     CLINDAMYCIN >=8 RESISTANT Resistant     RIFAMPIN <=0.5 SENSITIVE Sensitive     Inducible Clindamycin NEGATIVE Sensitive     * METHICILLIN RESISTANT STAPHYLOCOCCUS AUREUS  Blood Culture ID Panel (Reflexed)     Status: Abnormal   Collection Time: 11/28/20  9:34 AM  Result Value Ref Range Status   Enterococcus faecalis NOT DETECTED NOT DETECTED Final   Enterococcus Faecium NOT DETECTED NOT DETECTED Final   Listeria monocytogenes NOT DETECTED NOT DETECTED Final   Staphylococcus species DETECTED (A) NOT DETECTED Final    Comment: CRITICAL RESULT CALLED TO, READ BACK BY AND VERIFIED WITH: T ALLEN,RN@0209  11/29/20 [place]    Staphylococcus aureus (BCID) DETECTED (A) NOT DETECTED Final    Comment: Methicillin (oxacillin)-resistant Staphylococcus aureus (MRSA). MRSA is predictably resistant to beta-lactam antibiotics (except ceftaroline). Preferred therapy is vancomycin unless clinically contraindicated. Patient requires  contact precautions if  hospitalized. CRITICAL RESULT CALLED TO, READ BACK BY AND VERIFIED WITH: T ALLEN,RN@0209  11/29/20 [place]    Staphylococcus epidermidis NOT DETECTED NOT DETECTED Final   Staphylococcus lugdunensis NOT DETECTED NOT DETECTED Final   Streptococcus species DETECTED (A) NOT DETECTED Final    Comment: Not Enterococcus species, Streptococcus agalactiae, Streptococcus pyogenes, or Streptococcus pneumoniae. CRITICAL RESULT CALLED TO, READ BACK BY AND VERIFIED WITH: T ALLEN,RN@ 0209 11/29/20 [place]    Streptococcus agalactiae NOT DETECTED NOT DETECTED Final  Streptococcus pneumoniae NOT DETECTED NOT DETECTED Final   Streptococcus pyogenes NOT DETECTED NOT DETECTED Final   A.calcoaceticus-baumannii NOT DETECTED NOT DETECTED Final   Bacteroides fragilis NOT DETECTED NOT DETECTED Final   Enterobacterales NOT DETECTED NOT DETECTED Final   Enterobacter cloacae complex NOT DETECTED NOT DETECTED Final   Escherichia coli NOT DETECTED NOT DETECTED Final   Klebsiella aerogenes NOT DETECTED NOT DETECTED Final   Klebsiella oxytoca NOT DETECTED NOT DETECTED Final   Klebsiella pneumoniae NOT DETECTED NOT DETECTED Final   Proteus species NOT DETECTED NOT DETECTED Final   Salmonella species NOT DETECTED NOT DETECTED Final   Serratia marcescens NOT DETECTED NOT DETECTED Final   Haemophilus influenzae NOT DETECTED NOT DETECTED Final   Neisseria meningitidis NOT DETECTED NOT DETECTED Final   Pseudomonas aeruginosa NOT DETECTED NOT DETECTED Final   Stenotrophomonas maltophilia NOT DETECTED NOT DETECTED Final   Candida albicans NOT DETECTED NOT DETECTED Final   Candida auris NOT DETECTED NOT DETECTED Final   Candida glabrata NOT DETECTED NOT DETECTED Final   Candida krusei NOT DETECTED NOT DETECTED Final   Candida parapsilosis NOT DETECTED NOT DETECTED Final   Candida tropicalis NOT DETECTED NOT DETECTED Final   Cryptococcus neoformans/gattii NOT DETECTED NOT DETECTED Final   Meth resistant  mecA/C and MREJ DETECTED (A) NOT DETECTED Final    Comment: CRITICAL RESULT CALLED TO, READ BACK BY AND VERIFIED WITH: T ALLEN,RN@0209  11/29/20 [place] Performed at [facility], [address], [place], [place] [address]   Culture, blood (Routine X 2) w Reflex to ID Panel     Status: Abnormal   Collection Time: 11/30/20  4:53 AM   Specimen: BLOOD RIGHT HAND  Result Value Ref Range Status   Specimen Description   Final    BLOOD RIGHT HAND Performed at [facility], [address] [place], [place] [address]    Special Requests   Final    BOTTLES DRAWN AEROBIC AND ANAEROBIC Blood Culture adequate volume Performed at [facility] [place], [place] [address]    Culture  Setup Time   Final    GRAM NEGATIVE RODS AEROBIC BOTTLE ONLY Gram Stain Report Called to,Read Back By and Verified With: EVANS T.,2003,11/30/2020,SELF S CRITICAL RESULT CALLED TO, READ BACK BY AND VERIFIED WITH: S,HURTH PHARMD @1216  12/01/20 EB Performed at [facility], [place] [address] [place], [place] [address]    Culture ESCHERICHIA COLI (A)  Final   Report Status 12/02/2020 FINAL  Final   Organism ID, Bacteria ESCHERICHIA COLI  Final      Susceptibility   Escherichia coli - MIC*    AMPICILLIN >=32 RESISTANT Resistant     CEFAZOLIN <=4 SENSITIVE Sensitive     CEFEPIME <=0.12 SENSITIVE Sensitive     CEFTAZIDIME <=1 SENSITIVE Sensitive     CEFTRIAXONE <=0.25 SENSITIVE Sensitive     CIPROFLOXACIN <=0.25 SENSITIVE Sensitive     GENTAMICIN >=16 RESISTANT Resistant     IMIPENEM <=0.25 SENSITIVE Sensitive     TRIMETH/SULFA >=320 RESISTANT Resistant     AMPICILLIN/SULBACTAM >=32 RESISTANT Resistant     PIP/TAZO <=4 SENSITIVE Sensitive     * ESCHERICHIA COLI  Culture, blood (Routine X 2) w Reflex to ID Panel     Status: None   Collection Time: 11/30/20  4:53 AM   Specimen: BLOOD RIGHT ARM  Result Value Ref Range Status   Specimen Description BLOOD RIGHT ARM  Final   Special Requests   Final     BOTTLES  DRAWN AEROBIC AND ANAEROBIC Blood Culture results may not be optimal due to an excessive volume of blood received in culture bottles   Culture   Final    NO GROWTH 5 DAYS Performed at [facility], [address] [place], [place] [address]    Report Status 12/05/2020 FINAL  Final  Blood Culture ID Panel (Reflexed)     Status: Abnormal   Collection Time: 11/30/20  4:53 AM  Result Value Ref Range Status   Enterococcus faecalis NOT DETECTED NOT DETECTED Final   Enterococcus Faecium NOT DETECTED NOT DETECTED Final   Listeria monocytogenes NOT DETECTED NOT DETECTED Final   Staphylococcus species NOT DETECTED NOT DETECTED Final   Staphylococcus aureus (BCID) NOT DETECTED NOT DETECTED Final   Staphylococcus epidermidis NOT DETECTED NOT DETECTED Final   Staphylococcus lugdunensis NOT DETECTED NOT DETECTED Final   Streptococcus species NOT DETECTED NOT DETECTED Final   Streptococcus agalactiae NOT DETECTED NOT DETECTED Final   Streptococcus pneumoniae NOT DETECTED NOT DETECTED Final   Streptococcus pyogenes NOT DETECTED NOT DETECTED Final   A.calcoaceticus-baumannii NOT DETECTED NOT DETECTED Final   Bacteroides fragilis NOT DETECTED NOT DETECTED Final   Enterobacterales DETECTED (A) NOT DETECTED Final    Comment: Enterobacterales represent a large order of gram negative bacteria, not a single organism. CRITICAL RESULT CALLED TO, READ BACK BY AND VERIFIED WITH: S,HURTH PHARMD @1216  12/01/20 EB    Enterobacter cloacae complex NOT DETECTED NOT DETECTED Final   Escherichia coli DETECTED (A) NOT DETECTED Final    Comment: CRITICAL RESULT CALLED TO, READ BACK BY AND VERIFIED WITH: S,HURTH PHARMD @1216  12/01/20 EB    Klebsiella aerogenes NOT DETECTED NOT DETECTED Final   Klebsiella oxytoca NOT DETECTED NOT DETECTED Final   Klebsiella pneumoniae NOT DETECTED NOT DETECTED Final   Proteus species NOT DETECTED NOT DETECTED Final   Salmonella species NOT DETECTED NOT DETECTED Final    Serratia marcescens NOT DETECTED NOT DETECTED Final   Haemophilus influenzae NOT DETECTED NOT DETECTED Final   Neisseria meningitidis NOT DETECTED NOT DETECTED Final   Pseudomonas aeruginosa NOT DETECTED NOT DETECTED Final   Stenotrophomonas maltophilia NOT DETECTED NOT DETECTED Final   Candida albicans NOT DETECTED NOT DETECTED Final   Candida auris NOT DETECTED NOT DETECTED Final   Candida glabrata NOT DETECTED NOT DETECTED Final   Candida krusei NOT DETECTED NOT DETECTED Final   Candida parapsilosis NOT DETECTED NOT DETECTED Final   Candida tropicalis NOT DETECTED NOT DETECTED Final   Cryptococcus neoformans/gattii NOT DETECTED NOT DETECTED Final   CTX-M ESBL NOT DETECTED NOT DETECTED Final   Carbapenem resistance IMP NOT DETECTED NOT DETECTED Final   Carbapenem resistance KPC NOT DETECTED NOT DETECTED Final   Carbapenem resistance NDM NOT DETECTED NOT DETECTED Final   Carbapenem resist OXA 48 LIKE NOT DETECTED NOT DETECTED Final   Carbapenem resistance VIM NOT DETECTED NOT DETECTED Final    Comment: Performed at [facility], [address], [place], [place] [address]  Culture, blood (Routine X 2) w Reflex to ID Panel     Status: Abnormal   Collection Time: 12/01/20 12:46 PM   Specimen: BLOOD  Result Value Ref Range Status   Specimen Description   Final    BLOOD DRAWN BY DIALYSIS DRAWN BY RN Performed at [facility], [address] [place],  [address]    Special Requests   Final    BOTTLES DRAWN AEROBIC AND ANAEROBIC Blood Culture adequate volume Performed at [facility], [address],  [place], [place] [address]    Culture  Setup Time   Final    GRAM POSITIVE COCCI both anaerobic and aerobic bottles Gram Stain Report Called to,Read Back By and Verified With: Rhew,J@0545  by Matthews,B 10.22.22 Performed at [facility], [address] [place], [place] [address]    Culture (A)  Final    STAPHYLOCOCCUS AUREUS SUSCEPTIBILITIES PERFORMED ON PREVIOUS CULTURE  WITHIN THE LAST 5 DAYS. Performed at [facility], [place] [address] [place], [place] [address]    Report Status 12/04/2020 FINAL  Final  Culture, blood (Routine X 2) w Reflex to ID Panel     Status: Abnormal   Collection Time: 12/01/20 12:55 PM   Specimen: BLOOD  Result Value Ref Range Status   Specimen Description   Final    BLOOD DRAWN BY DIALYSIS DRAWN BY RN Performed at [facility], [address], [place], [place] [address]    Special Requests   Final    BOTTLES DRAWN AEROBIC AND ANAEROBIC Blood Culture adequate volume Performed at [facility], [address], [place], [place] [address]    Culture  Setup Time   Final    GRAM POSITIVE COCCI ANAEROBIC BOTTLE ONLY Gram Stain Report Called to,Read Back By and Verified With: HUMPHRIES @ 1600 ON 102222 BY HENDERSON L CRITICAL VALUE NOTED.  VALUE IS CONSISTENT WITH PREVIOUSLY REPORTED AND CALLED VALUE.    Culture (A)  Final    STAPHYLOCOCCUS AUREUS SUSCEPTIBILITIES PERFORMED ON PREVIOUS CULTURE WITHIN THE LAST 5 DAYS. Performed at [facility], [place] [address] [place], [place] [address]    Report Status 12/04/2020 FINAL  Final  Culture, blood (routine x 2)     Status: Abnormal   Collection Time: 12/03/20  1:03 PM   Specimen: BLOOD RIGHT HAND  Result Value Ref Range Status   Specimen Description   Final    BLOOD RIGHT HAND BOTTLES DRAWN AEROBIC AND ANAEROBIC Performed at [facility], [address] [place], [place] [address]    Special Requests   Final    Blood Culture adequate volume Performed at [facility], [address] [place], [place] [address]    Culture  Setup Time   Final    GRAM NEGATIVE RODS IN BOTH AEROBIC AND ANAEROBIC BOTTLES Gram Stain Report Called to,Read Back By and Verified With: johnson,b@0520  by matthews, b 10.24.22 CRITICAL VALUE NOTED.  VALUE IS CONSISTENT WITH PREVIOUSLY REPORTED AND CALLED VALUE. Performed at [facility], [address], [place], [place] [address]    Culture (A)   Final    ESCHERICHIA COLI SUSCEPTIBILITIES PERFORMED ON PREVIOUS CULTURE WITHIN THE LAST 5 DAYS. Performed at [facility], [place] [address] [place], [place] [address]    Report Status 12/05/2020 FINAL  Final  Culture, blood (routine x 2)     Status: Abnormal   Collection Time: 12/03/20  1:09 PM   Specimen: Right Antecubital; Blood  Result Value Ref Range Status   Specimen Description   Final    RIGHT ANTECUBITAL BOTTLES DRAWN AEROBIC AND ANAEROBIC Performed at [facility], [address], [place], [place] [address]    Special Requests   Final    Blood Culture adequate volume Performed at [facility], [address], [place],  [address]    Culture  Setup Time   Final    GRAM POSITIVE COCCI ANAEROBIC BOTTLE ONLY GRAM NEGATIVE RODS AEROBIC BOTTLE ONLY Gram Stain Report Called to,Read Back By and Verified With: BOTH ORGANISMS, MORRIS,C AT [phone]  ON 10.24.22 BY RUCINSKI,B CRITICAL VALUE NOTED.  VALUE IS CONSISTENT WITH PREVIOUSLY REPORTED AND CALLED VALUE.    Culture (A)  Final    STAPHYLOCOCCUS AUREUS ESCHERICHIA COLI SUSCEPTIBILITIES PERFORMED ON PREVIOUS CULTURE WITHIN THE LAST 5 DAYS. Performed at [facility], [place] [address], [place], [place] [address]    Report Status 12/06/2020 FINAL  Final  Culture, blood (Routine X 2) w Reflex to ID Panel     Status: None (Preliminary result)   Collection Time: 12/05/20  5:29 AM   Specimen: BLOOD RIGHT HAND  Result Value Ref Range Status   Specimen Description BLOOD RIGHT HAND  Final   Special Requests   Final    BOTTLES DRAWN AEROBIC ONLY Blood Culture results may not be optimal due to an inadequate volume of blood received in culture bottles   Culture   Final    NO GROWTH 2 DAYS Performed at [facility], [address] [place], [place] [address]    Report Status PENDING  Incomplete  Culture, blood (Routine X 2) w Reflex to ID Panel     Status: None (Preliminary result)   Collection Time: 12/05/20  5:29 AM    Specimen: BLOOD RIGHT ARM  Result Value Ref Range Status   Specimen Description BLOOD RIGHT ARM  Final   Special Requests   Final    BOTTLES DRAWN AEROBIC AND ANAEROBIC Blood Culture results may not be optimal due to an excessive volume of blood received in culture bottles   Culture   Final    NO GROWTH 2 DAYS Performed at [facility], [address], [place], [place] [address]    Report Status PENDING  Incomplete    Coagulation Studies: No results for input(s): LABPROT, INR in the last 72 hours.  Urinalysis: No results for input(s): COLORURINE, LABSPEC, PHURINE, GLUCOSEU, HGBUR, BILIRUBINUR, KETONESUR, PROTEINUR, UROBILINOGEN, NITRITE, LEUKOCYTESUR in the last 72 hours.  Invalid input(s): APPERANCEUR    Imaging: MR LUMBAR SPINE WO CONTRAST  Result Date: 12/05/2020 CLINICAL DATA:  Low back pain, infection suspected low back pain. Recurrent MRSA bacteremia EXAM: MRI LUMBAR SPINE WITHOUT CONTRAST TECHNIQUE: Multiplanar, multisequence MR imaging of the lumbar spine was performed. No intravenous contrast was administered. COMPARISON:  None. FINDINGS: Motion artifact is present. Segmentation: Standard. Alignment:  No significant listhesis. Vertebrae: Mildly decreased T1 marrow signal may reflect hematopoietic marrow. No substantial marrow edema. No suspicious osseous lesion Conus medullaris and cauda equina: Conus extends to the L1 level. Conus and cauda equina appear normal. Paraspinal and other soft tissues: Unremarkable. Disc levels: L1-L2:  No canal or foraminal stenosis. L2-L3: Disc bulge. Facet arthropathy with ligamentum flavum infolding. No canal stenosis. Mild foraminal stenosis. L3-L4: Level was not included on axial imaging. On sagittal imaging, there is a disc bulge and central annular fissure as well as facet arthropathy. Likely at least mild canal stenosis. Mild to moderate right and mild left foraminal stenosis. L4-L5: Disc bulge. Marked facet arthropathy with ligamentum flavum  infolding and joint effusions with extra-spinal synovial cyst formation. Marked canal stenosis with effacement of subarticular recesses. Mild to moderate foraminal stenosis. L5-S1: Disc bulge with small central annular fissure. No canal or foraminal stenosis. Signal within the right lateral epidural space may reflect traversing vessels. IMPRESSION: Degraded by motion.  No evidence of discitis/osteomyelitis. Multilevel degenerative changes as detailed above. There is marked canal stenosis at L4-L5 with effacement of subarticular recesses. Facet arthropathy is also greatest at this level. The L3-L4 level was not included on axial imaging  due to technical error. There is likely at least mild canal stenosis, mild to moderate right foraminal stenosis, and mild left foraminal stenosis. Facet arthropathy is present at this level. If desired, patient can be brought back for repeat scanning without charge. Electronically Signed   By: Macy Mis M.D.   On: 12/05/2020 17:26   ECHO TEE  Result Date: 12/05/2020    TRANSESOPHOGEAL ECHO REPORT   Patient Name:   [facility]ONO [facility] Date of Exam: 12/05/2020 Medical Rec #:  [id number]           Height:       74.0 in Accession #:    [id number]          Weight:       252.6 lb Date of Birth:  01/23/1951           BSA:          2.400 m Patient Age:    70 years            BP:           132/59 mmHg Patient Gender: M                   HR:           78 bpm. Exam Location:  [facility] Procedure: Transesophageal Echo, Color Doppler and Cardiac Doppler Indications:    Bacteremia R78.81  History:        Patient has prior history of Echocardiogram examinations, most                 recent 11/28/2020. CAD, Prior CABG, Arrythmias:Atrial                 Fibrillation; Risk Factors:Hypertension, Diabetes and                 Dyslipidemia. ESRD (end stage renal disease).  Sonographer:    Alvino Chapel RCS Referring Phys: [id number] PAULA V ROSS PROCEDURE: The transesophogeal probe was passed without  difficulty through the esophogus of the patient. Sedation performed by different physician. The patient developed no complications during the procedure. IMPRESSIONS  1. Left ventricular ejection fraction, by estimation, is 50 to 55%. The left ventricle has low normal function.  2. Right ventricular systolic function is low normal. The right ventricular size is moderately enlarged.  3. Left atrial size was severely dilated. No left atrial/left atrial appendage thrombus was detected. The LAA emptying velocity was 65 cm/s.  4. Right atrial size was severely dilated.  5. The mitral valve is normal in structure. Mild mitral valve regurgitation. No evidence of mitral stenosis.  6. The tricuspid valve is abnormal. Tricuspid valve regurgitation is moderate.  7. The aortic valve is tricuspid. There is mild calcification of the aortic valve. Aortic valve regurgitation is not visualized. No aortic stenosis is present. Conclusion(s)/Recommendation(s): No evidence of vegetation/infective endocarditis on this transesophageal echocardiogram. FINDINGS  Left Ventricle: Left ventricular ejection fraction, by estimation, is 50 to 55%. The left ventricle has low normal function. The left ventricular internal cavity size was normal in size. Right Ventricle: The right ventricular size is moderately enlarged. Right vetricular wall thickness was not assessed. Right ventricular systolic function is low normal. Left Atrium: Left atrial size was severely dilated. No left atrial/left atrial appendage thrombus was detected. The LAA emptying velocity was 65 cm/s. Right Atrium: Right atrial size was severely dilated. Pericardium: There is no evidence of pericardial effusion. Mitral Valve: The mitral valve is normal in  structure. Mild mitral valve regurgitation. No evidence of mitral valve stenosis. Tricuspid Valve: The tricuspid valve is abnormal. Tricuspid valve regurgitation is moderate . No evidence of tricuspid stenosis. Aortic Valve: The  aortic valve is tricuspid. There is mild calcification of the aortic valve. Aortic valve regurgitation is not visualized. No aortic stenosis is present. Pulmonic Valve: The pulmonic valve was not well visualized. Pulmonic valve regurgitation is trivial. No evidence of pulmonic stenosis. Aorta: The aortic root is normal in size and structure. There is minimal (Grade I) plaque. IAS/Shunts: No atrial level shunt detected by color flow Doppler. Carlyle Dolly MD Electronically signed by Carlyle Dolly MD Signature Date/Time: 12/05/2020/4:24:16 PM    Final      Medications:     ceFAZolin (ANCEF) IV 2 g (12/06/20 1730)   heparin 3,000 Units/hr (12/07/20 0626)   vancomycin 1,250 mg (12/06/20 1640)    calcitRIOL  0.5 mcg Oral Q M,W,F   calcium acetate  1,334 mg Oral TID with meals   Chlorhexidine Gluconate Cloth  6 each Topical Q0600   insulin aspart  0-6 Units Subcutaneous TID WC   insulin aspart  2 Units Subcutaneous TID WC   loratadine  10 mg Oral q1800   mouth rinse  15 mL Mouth Rinse BID   mupirocin ointment   Nasal BID   pantoprazole  40 mg Oral Daily   silver sulfADIAZINE   Topical BID   acetaminophen **OR** acetaminophen, albuterol, fluticasone, heparin, ondansetron **OR** ondansetron (ZOFRAN) IV  Assessment/ Plan:  Dialysis Orders: Center: [facility]  on MWF . EDW 120.5kg HD Bath 3K/2.5Ca  Time 4 hours Heparin 2000 units bolus. Access RIJ TDC (maturing LUE AVF) BFR 400 DFR 800    Calcitriol 0.5 mcg po/HD Venofer  100 mg TIW, Micera 100 mcg IVP every 2 weeks   Assessment/Plan:  Acute hypoxic respiratory failure - presumably due to volume overload and acute diastolic CHF.  Improved with ongoing HD and UF.   Will continue to challenge edw (already 6 kg below edw).  ESRD -  continue with HD MWF while he remains an inpatient. Next dialysis 10/28  Hypertension/volume  - as above, will continue to challenge edw.  Anemia  - stable  Metabolic bone disease -  continue with home meds  Nutrition -   renal diet, carb modified.  Chronic atrial fibrillation - rate controlled History of DVT/PE - to resume apixaban. ID  - had fever with + blood cultures, initially for MRSA and now E. Coli (12/03/20).  TDC removed 11/29/20 and on IV cefepime and Vancomycin.  Continue to follow. TEE 12/05/20 negative for vegetations and MRI of LS spine negative for osteo but MRI of foot suspicious for early osteo of fifth metatarsal head. New blood cultures drawn today. Per ID for IV vanc and fortaz for 6 weeks for now. Vascular access - TDC removed 10/19 and using LUE AVF but has high venous pressures.  Will hold off on new [facility] for now and continue to use AVF. Deconditioning - pt is very weak and unable to stand.  Will likely need PT/OT and SNF placement for rehab.     LOS: [place] @TODAY @11 :31 AM

## 2020-12-07 NOTE — Progress Notes (Signed)
ANTICOAGULATION CONSULT NOTE  Pharmacy Consult for Heparin Indication: hx DVT/afib  Allergies  Allergen Reactions   Latex Hives and Rash   Lipitor [Atorvastatin]     Leg aches     Patient Measurements: Height: 6\' 2"  (188 cm) Weight: 116.6 kg (257 lb 0.9 oz) IBW/kg (Calculated) : 82.2 HEPARIN DW (KG): 107.6   Vital Signs: Temp: 98.2 F (36.8 C) (10/26 2031) Temp Source: Oral (10/26 2031) BP: 101/59 (10/26 2031) Pulse Rate: 76 (10/26 2031)  Labs: Recent Labs    12/05/20 0757 12/05/20 1958 12/06/20 0558 12/06/20 1400 12/06/20 1406 12/07/20 0518  HGB 10.5*  --  9.4*  --   --  9.7*  HCT 34.0*  --  31.0*  --   --  32.6*  PLT 226  --  255  --   --  238  APTT 73* 50* 92*  --   --   --   HEPARINUNFRC 0.51  --  0.46 0.39  --  0.13*  CREATININE 6.28*  --   --   --  7.38*  --      Estimated Creatinine Clearance: 12.8 mL/min (A) (by C-G formula based on SCr of 7.38 mg/dL (H)).   Assessment: 70 y.o. male on apixaban PTA for h/o DVT/PE and Afib, now being held. Last LE doppler July 2022 was negative. Pharmacy consulted for heparin.  Heparin level down to subtherapeutic (0.13) on gtt at 2800 units/hr. No issues with line or bleeding reported per RN.   Goal of Therapy:  Heparin level 0.3-0.7 units/ml Monitor platelets by anticoagulation protocol: Yes   Plan:  Increase heparin infusion to 3000 units/hr F/u 8 hr heparin level  Sherlon Handing, PharmD, BCPS Please see amion for complete clinical pharmacist phone list 12/07/2020 6:19 AM

## 2020-12-08 ENCOUNTER — Inpatient Hospital Stay
Admission: RE | Admit: 2020-12-08 | Discharge: 2021-01-11 | Disposition: E | Payer: PPO | Source: Ambulatory Visit | Attending: Internal Medicine | Admitting: Internal Medicine

## 2020-12-08 DIAGNOSIS — I259 Chronic ischemic heart disease, unspecified: Secondary | ICD-10-CM | POA: Diagnosis not present

## 2020-12-08 DIAGNOSIS — B9620 Unspecified Escherichia coli [E. coli] as the cause of diseases classified elsewhere: Secondary | ICD-10-CM | POA: Diagnosis not present

## 2020-12-08 DIAGNOSIS — Z992 Dependence on renal dialysis: Secondary | ICD-10-CM | POA: Diagnosis not present

## 2020-12-08 DIAGNOSIS — N250 Renal osteodystrophy: Secondary | ICD-10-CM | POA: Diagnosis not present

## 2020-12-08 DIAGNOSIS — E119 Type 2 diabetes mellitus without complications: Secondary | ICD-10-CM | POA: Diagnosis not present

## 2020-12-08 DIAGNOSIS — R1319 Other dysphagia: Secondary | ICD-10-CM | POA: Diagnosis not present

## 2020-12-08 DIAGNOSIS — I509 Heart failure, unspecified: Secondary | ICD-10-CM | POA: Diagnosis not present

## 2020-12-08 DIAGNOSIS — R7881 Bacteremia: Secondary | ICD-10-CM | POA: Diagnosis not present

## 2020-12-08 DIAGNOSIS — I5033 Acute on chronic diastolic (congestive) heart failure: Secondary | ICD-10-CM | POA: Diagnosis not present

## 2020-12-08 DIAGNOSIS — Z23 Encounter for immunization: Secondary | ICD-10-CM | POA: Diagnosis not present

## 2020-12-08 DIAGNOSIS — M6281 Muscle weakness (generalized): Secondary | ICD-10-CM | POA: Diagnosis not present

## 2020-12-08 DIAGNOSIS — N186 End stage renal disease: Secondary | ICD-10-CM | POA: Diagnosis not present

## 2020-12-08 DIAGNOSIS — R4702 Dysphasia: Secondary | ICD-10-CM | POA: Diagnosis not present

## 2020-12-08 DIAGNOSIS — K219 Gastro-esophageal reflux disease without esophagitis: Secondary | ICD-10-CM | POA: Diagnosis not present

## 2020-12-08 DIAGNOSIS — R52 Pain, unspecified: Secondary | ICD-10-CM | POA: Diagnosis not present

## 2020-12-08 DIAGNOSIS — R7989 Other specified abnormal findings of blood chemistry: Secondary | ICD-10-CM | POA: Diagnosis not present

## 2020-12-08 DIAGNOSIS — E785 Hyperlipidemia, unspecified: Secondary | ICD-10-CM | POA: Diagnosis not present

## 2020-12-08 DIAGNOSIS — R41841 Cognitive communication deficit: Secondary | ICD-10-CM | POA: Diagnosis not present

## 2020-12-08 DIAGNOSIS — J9601 Acute respiratory failure with hypoxia: Secondary | ICD-10-CM | POA: Diagnosis not present

## 2020-12-08 DIAGNOSIS — I25810 Atherosclerosis of coronary artery bypass graft(s) without angina pectoris: Secondary | ICD-10-CM | POA: Diagnosis not present

## 2020-12-08 DIAGNOSIS — E1122 Type 2 diabetes mellitus with diabetic chronic kidney disease: Secondary | ICD-10-CM | POA: Diagnosis not present

## 2020-12-08 DIAGNOSIS — I482 Chronic atrial fibrillation, unspecified: Secondary | ICD-10-CM | POA: Diagnosis not present

## 2020-12-08 DIAGNOSIS — I5032 Chronic diastolic (congestive) heart failure: Secondary | ICD-10-CM | POA: Diagnosis not present

## 2020-12-08 DIAGNOSIS — R262 Difficulty in walking, not elsewhere classified: Secondary | ICD-10-CM | POA: Diagnosis not present

## 2020-12-08 DIAGNOSIS — I120 Hypertensive chronic kidney disease with stage 5 chronic kidney disease or end stage renal disease: Secondary | ICD-10-CM | POA: Diagnosis not present

## 2020-12-08 DIAGNOSIS — A4102 Sepsis due to Methicillin resistant Staphylococcus aureus: Secondary | ICD-10-CM | POA: Diagnosis not present

## 2020-12-08 DIAGNOSIS — I5030 Unspecified diastolic (congestive) heart failure: Secondary | ICD-10-CM | POA: Diagnosis not present

## 2020-12-08 DIAGNOSIS — I2699 Other pulmonary embolism without acute cor pulmonale: Secondary | ICD-10-CM | POA: Diagnosis not present

## 2020-12-08 DIAGNOSIS — Z9181 History of falling: Secondary | ICD-10-CM | POA: Diagnosis not present

## 2020-12-08 DIAGNOSIS — I5021 Acute systolic (congestive) heart failure: Secondary | ICD-10-CM | POA: Diagnosis not present

## 2020-12-08 DIAGNOSIS — I82532 Chronic embolism and thrombosis of left popliteal vein: Secondary | ICD-10-CM | POA: Diagnosis not present

## 2020-12-08 DIAGNOSIS — B9562 Methicillin resistant Staphylococcus aureus infection as the cause of diseases classified elsewhere: Secondary | ICD-10-CM | POA: Diagnosis not present

## 2020-12-08 DIAGNOSIS — R2681 Unsteadiness on feet: Secondary | ICD-10-CM | POA: Diagnosis not present

## 2020-12-08 DIAGNOSIS — R627 Adult failure to thrive: Secondary | ICD-10-CM | POA: Diagnosis not present

## 2020-12-08 DIAGNOSIS — E039 Hypothyroidism, unspecified: Secondary | ICD-10-CM | POA: Diagnosis not present

## 2020-12-08 DIAGNOSIS — E11621 Type 2 diabetes mellitus with foot ulcer: Secondary | ICD-10-CM | POA: Diagnosis not present

## 2020-12-08 DIAGNOSIS — I214 Non-ST elevation (NSTEMI) myocardial infarction: Secondary | ICD-10-CM | POA: Diagnosis not present

## 2020-12-08 DIAGNOSIS — I4819 Other persistent atrial fibrillation: Secondary | ICD-10-CM | POA: Diagnosis not present

## 2020-12-08 DIAGNOSIS — I5042 Chronic combined systolic (congestive) and diastolic (congestive) heart failure: Secondary | ICD-10-CM | POA: Diagnosis not present

## 2020-12-08 DIAGNOSIS — I129 Hypertensive chronic kidney disease with stage 1 through stage 4 chronic kidney disease, or unspecified chronic kidney disease: Secondary | ICD-10-CM | POA: Diagnosis not present

## 2020-12-08 DIAGNOSIS — R498 Other voice and resonance disorders: Secondary | ICD-10-CM | POA: Diagnosis not present

## 2020-12-08 DIAGNOSIS — Z741 Need for assistance with personal care: Secondary | ICD-10-CM | POA: Diagnosis not present

## 2020-12-08 DIAGNOSIS — I10 Essential (primary) hypertension: Secondary | ICD-10-CM | POA: Diagnosis not present

## 2020-12-08 DIAGNOSIS — N2581 Secondary hyperparathyroidism of renal origin: Secondary | ICD-10-CM | POA: Diagnosis not present

## 2020-12-08 DIAGNOSIS — D649 Anemia, unspecified: Secondary | ICD-10-CM | POA: Diagnosis not present

## 2020-12-08 DIAGNOSIS — D689 Coagulation defect, unspecified: Secondary | ICD-10-CM | POA: Diagnosis not present

## 2020-12-08 DIAGNOSIS — I132 Hypertensive heart and chronic kidney disease with heart failure and with stage 5 chronic kidney disease, or end stage renal disease: Secondary | ICD-10-CM | POA: Diagnosis not present

## 2020-12-08 DIAGNOSIS — E1129 Type 2 diabetes mellitus with other diabetic kidney complication: Secondary | ICD-10-CM | POA: Diagnosis not present

## 2020-12-08 LAB — RENAL FUNCTION PANEL
Albumin: 2.6 g/dL — ABNORMAL LOW (ref 3.5–5.0)
Anion gap: 12 (ref 5–15)
BUN: 77 mg/dL — ABNORMAL HIGH (ref 8–23)
CO2: 25 mmol/L (ref 22–32)
Calcium: 8.7 mg/dL — ABNORMAL LOW (ref 8.9–10.3)
Chloride: 89 mmol/L — ABNORMAL LOW (ref 98–111)
Creatinine, Ser: 6.51 mg/dL — ABNORMAL HIGH (ref 0.61–1.24)
GFR, Estimated: 9 mL/min — ABNORMAL LOW (ref >60)
Glucose, Bld: 191 mg/dL — ABNORMAL HIGH (ref 70–99)
Phosphorus: 5.4 mg/dL — ABNORMAL HIGH (ref 2.5–4.6)
Potassium: 4.4 mmol/L (ref 3.5–5.1)
Sodium: 126 mmol/L — ABNORMAL LOW (ref 135–145)

## 2020-12-08 LAB — GLUCOSE, CAPILLARY
Glucose-Capillary: 229 mg/dL — ABNORMAL HIGH (ref 70–99)
Glucose-Capillary: 156 mg/dL — ABNORMAL HIGH (ref 70–99)

## 2020-12-08 LAB — CBC
HCT: 30.3 % — ABNORMAL LOW (ref 39.0–52.0)
Hemoglobin: 9.4 g/dL — ABNORMAL LOW (ref 13.0–17.0)
MCH: 27.7 pg (ref 26.0–34.0)
MCHC: 31 g/dL (ref 30.0–36.0)
MCV: 89.4 fL (ref 80.0–100.0)
Platelets: 253 10*3/uL (ref 150–400)
RBC: 3.39 MIL/uL — ABNORMAL LOW (ref 4.22–5.81)
RDW: 18.3 % — ABNORMAL HIGH (ref 11.5–15.5)
WBC: 13.9 10*3/uL — ABNORMAL HIGH (ref 4.0–10.5)
nRBC: 0.1 % (ref 0.0–0.2)

## 2020-12-08 MED ORDER — VANCOMYCIN IV (FOR PTA / DISCHARGE USE ONLY): 1250.0000 mg | INTRAVENOUS | 0 refills | Status: AC

## 2020-12-08 MED ORDER — APIXABAN 5 MG PO TABS: 5.0000 mg | ORAL_TABLET | Freq: Two times a day (BID) | ORAL | Status: AC

## 2020-12-08 MED ORDER — FLUTICASONE PROPIONATE 50 MCG/ACT NA SUSP: 2.0000 | Freq: Every day | NASAL | Status: AC | PRN

## 2020-12-08 MED ORDER — CALCITRIOL 0.5 MCG PO CAPS: 0.5000 ug | ORAL_CAPSULE | ORAL | Status: AC

## 2020-12-08 MED ORDER — SILVER SULFADIAZINE 1 % EX CREA: TOPICAL_CREAM | Freq: Two times a day (BID) | CUTANEOUS | 0 refills | Status: AC

## 2020-12-08 MED ORDER — CEFAZOLIN IV (FOR PTA / DISCHARGE USE ONLY): 2.0000 g | INTRAVENOUS | 0 refills | Status: AC

## 2020-12-08 MED ORDER — INSULIN ASPART 100 UNIT/ML IJ SOLN: 2.0000 [IU] | Freq: Three times a day (TID) | INTRAMUSCULAR | 11 refills | Status: AC

## 2020-12-08 MED ORDER — INSULIN GLARGINE-YFGN 100 UNIT/ML ~~LOC~~ SOLN: 10.0000 [IU] | Freq: Every day | SUBCUTANEOUS | 11 refills | Status: AC

## 2020-12-08 NOTE — Procedures (Signed)
   HEMODIALYSIS TREATMENT NOTE:   Uneventful 3.5 hour heparin-free HD session completed via left upper arm AVF (16g/antegrade). Goal met: 2 liters removed without interruption in UF.  Vancomycin and Cefazolin were given at end of HD.  All blood was returned and hemostasis was achieved in 15 minutes.   Pt is to transfer to [facility] for rehab today.  Report was called to Janace Aris, RN at [facility] center.  Post weight for EDW adjustment and recommendations for cannulation of AVF were relayed.     Rockwell Alexandria, RN

## 2020-12-08 NOTE — Discharge Summary (Addendum)
Physician Discharge Summary  Ras Kollman Furlough GNF:[id number] DOB: 10-13-1950 DOA: 11/27/2020  PCP: [name], MD  Admit date: 11/27/2020 Discharge date: 12/03/2020  Disposition:  SNF [facility])  Recommendations for Outpatient Follow-up:  Follow up with surgery on 11/10 at 1:45 pm as scheduled Follow up with vascular surgery in 2-3 weeks to evaluate foot circulation Follow up with [facility] in 1 month regarding infection.   Follow up with Dr. Laural Golden as scheduled.  Resume regular hemodialysis scheduled Please check CBG four times daily Please continue IV antibiotics with hemodialysis for full 6 week course Bleeding precautions while on anticoagulation therapy.  Please order outpatient palliative care consultation.  Please obtain BMP/CBC in one week   Discharge Condition: STABLE   CODE STATUS: FULL DIET: Renal/Carb Modified    Brief Hospitalization Summary: Please see all hospital notes, images, labs for full details of the hospitalization. ADMISSION HPI: ZADE FALKNER is a 70 y.o. male with medical history significant of CHF, ERSD on HD (M-W-F), CAD, DVT/PE, and T2DM, CABG with post operative atrial fibrillation, pulmonary hypertension who presented with dyspnea.  Patient reported having cough for about 2 weeks, persistent and worsening.  Tried several antitussive agents at home without improvement of symptoms.  He went to dialysis on Friday, 3 days ago, yesterday he was noted to be dyspneic by his wife, moderate in intensity, associated with cough but no chest pain.  Last night he was noted to have poor balance, and he declined to be brought to the emergency department.  This morning he fell while in the bathroom, landed between the commode and the wall, EMS was called and patient was brought to the hospital.   His main symptom seems to be increased work of breathing/dyspnea, moderate to severe in intensity, worse with exertion, associated with cough, no chest pain,  no improving factors. Last night not able to sleep due to persistent cough, dry.  He continues to make urine 3 times daily.   In June 2022 he decided to stop apixaban due to weakness, he was placed on clopidogrel. Last year he had COVID-pneumonia and pulm embolism. About 3 months ago he was started on hemodialysis, he has a fistula which is not mature yet.  He uses a tunneled dialysis catheter for dialysis.     ED Course: Patient was found in respiratory distress, and placed on supplemental oxygen per nasal cannula.  Work-up showed elevated troponins and high D-dimer, he was placed on heparin drip. Nephrology has been contacted for hemodialysis.  HOSPITAL COURSE BY PROBLEM LIST  Sepsis - RESOLVED -present on admission -had fever and leukocytosis -due to MRSA bacteremia -sepsis physiology resolved   MRSA bacteremia/E Coli Bacteremia -removed tunneled HD catheter 10/19 after HD -10/18 blood culture = MRSA (peripheral) -10/20 blood culture = E Coli (peripheral) -12/01/20 blood culture = S aureus (on HD via fistula) -12/03/20 blood culture = GNR (peripheral) -12/05/20 Blood culture = neg -11/28/20 TTE--no obvious vegetation -able to to use fistula for HD 10/21 -12/05/20 TEE--no vegetation -12/05/20 MR lumbar>>no abscess or osteomyelitis/discitis -will need to delay permacath placement until neg blood culture -12/05/20--discussed with ID, Dr. Henreitta Cea current abx for now - per ID recommendation, continue IV abx for 6 weeks thru 01/16/21   EColi Bacteremia -continue cefepime on HD>>plan to switch to cefazolin on HD based on susceptibilities -10/20 blood culture = EColi--no MRSA -10/23 blood culture= EColi + MRSA -10/25 Blood culture  = no growth to date (x 2 days now)   Right Diabetic  foot ulcer/ Possible Early Osteomyelitis 5th metatarsal -spouse states wound is improving with care at home -MR right foot--Soft tissue ulceration along the lateral forefoot near the fifth  metatarsal head with underlying 2.3 cm curvilinear fluid collection,concerning for abscess -consult wound care -discussed with surgery--foot debrided 10/22>>no pus -10/24 wider foot debridement with small amount purulence -appreciate general surgery, Dr. Constance Haw -further bedside evaluation 10/27 done by Dr. Constance Haw - Wound care instructions provided. Pt can go to SNF [facility]) to continue wound care. Wound care instructions:  TWICE DAILY:  Please Cleanse with NS and pat dry. Take a Q tip with saline and probe the wounds (especially the lateral wound toward the top of the foot). Then with a q tip, Apply Silvadene to right lateral foot wounds (lateral and plantar surface), probe the two wounds with a qtip covered with silvadene and pack the area with the silvadene, going up into the tunnel as the lateral ulcer tracks toward the top of the foot about 1 cm, cover with kerlix and sock or ACE wrap.   Acute respiratory failure with hypoxia -due to pulmonary edema/pleural effusions -presented with oxygen saturation 87% on RA and tachypnea -stable on RA at this time -weaned off oxygen for saturation >92%   Acute on chronic diastolic CHF/Acute systolic CHF -4/31/54 Echo 60-65%, no WMA; RV systolic function preserved.  RSVP 57.3.  No significant valvular disease. -fluid removal on HD -11/28/20 Echo EF 45-50%+WMA, RV overload, no vegetation -stable on RA at this time   Dysphagia -12/02/20 EGD--no esophageal abnormality to explain dysphagia; dilated; mild portal hypertensive gastropathy -suspect he has esophageal dysmotility from his medical issues -swallowing better since dilatation   ESRD on hemodialysis  -appreciate renal consult -HD on 10/17, 10/19, 10/21, 10/24 -10/21, 10/24--able to use fistula   History DVT/PE -he is restarted back on apixaban by Dr. Carles Collet - no further procedures anticipated.    Chronic Afib -AC as discussed above -pt was restarted on apixaban for stroke prevention,  monitor for bleeding.  Off IV heparin 10/27.     Uncontrolled Diabetes mellitus type 2 with hyperglycemia -08/18/20 A1C--8.0 -please check CBG at least 4 times per day at [facility] -novolog sliding scale was used in hospital -10/19- A1C 6.3 -add glargine 10 units QHS  - further titrate outpatient  - novolog 2 units with each meal ordered, titrate further outpatient as needed   Right Elbow pain -due to recent fall on 11/27/20 -xray--no fracture; small anterior joint eff -improved--no further synovitis   Transaminasemia -likely due to sepsis and hemodynamic changes -trending down -f/u abd US--neg gallstones, slight GB thickening without murphy's; mild splenomegaly     Status is: Inpatient   Remains inpatient appropriate because: severity of illness    Family Communication:   daughter updated 10/25   Consultants:  renal, general surgery, GI, ID   Code Status:  FULL   DVT Prophylaxis:  apixaban   DISPO:  Pt is now medically stable to discharge to SNF.  Insurance authorization has not been received today.  If we receive it tomorrow will plan to Discharge SNF 10/28  after hemodialysis completed.         Discharge Diagnoses:  Principal Problem:   Heart failure ([place]) Active Problems:   Type 2 diabetes mellitus with hyperlipidemia (HCC)   Obesity   S/P CABG x 3   Essential hypertension   ESRD (end stage renal disease) (HCC)   CAD (coronary artery disease)   Sepsis due to methicillin resistant Staphylococcus aureus (MRSA) (  [place])   MRSA bacteremia   Acute respiratory failure with hypoxia (HCC)   Elevated LFTs   Persistent atrial fibrillation ([place])   Bacteremia due to Gram-negative bacteria   Esophageal dysphagia   Discharge Instructions: Discharge Instructions     [facility] pharmacist to adjust dose for Vancomycin, Aminoglycosides and other anti-infective therapies as requested by physician.   Complete by: As directed    [facility] to provide Cath  Flo 2mg    Complete by: As directed    Administer for PICC line occlusion and as ordered by physician for other access device issues.   Ambulatory referral to Infectious Disease   Complete by: As directed    Ambulatory referral to Vascular Surgery   Complete by: As directed    Anaphylaxis Kit: Provided to treat any anaphylactic reaction to the medication being provided to the patient if First Dose or when requested by physician   Complete by: As directed    Epinephrine 1mg /ml vial / amp: Administer 0.3mg  (0.57ml) subcutaneously once for moderate to severe anaphylaxis, nurse to call physician and pharmacy when reaction occurs and call 911 if needed for immediate care   Diphenhydramine 50mg /ml IV vial: Administer 25-50mg  IV/IM PRN for first dose reaction, rash, itching, mild reaction, nurse to call physician and pharmacy when reaction occurs   Sodium Chloride 0.9% NS 534ml IV: Administer if needed for hypovolemic blood pressure drop or as ordered by physician after call to physician with anaphylactic reaction   Change dressing on IV access line weekly and PRN   Complete by: As directed    Flush IV access with Sodium Chloride 0.9% and Heparin 10 units/ml or 100 units/ml   Complete by: As directed    Home infusion instructions - [facility]   Complete by: As directed    Instructions: Flush IV access with Sodium Chloride 0.9% and Heparin 10units/ml or 100units/ml   Change dressing on IV access line: Weekly and PRN   Instructions Cath Flo 2mg : Administer for PICC Line occlusion and as ordered by physician for other access device   [facility] pharmacist to adjust dose for: Vancomycin, Aminoglycosides and other anti-infective therapies as requested by physician   Method of administration may be changed at the discretion of home infusion pharmacist based upon assessment of the patient and/or caregiver's ability to self-administer the medication ordered   Complete by: As directed        Allergies as of 11/25/2020       Reactions   Latex Hives, Rash   Lipitor [atorvastatin]    Leg aches        Medication List     STOP taking these medications    clopidogrel 75 MG tablet Commonly known as: PLAVIX   fluorouracil 5 % cream Commonly known as: EFUDEX   levocetirizine 5 MG tablet Commonly known as: XYZAL   Repatha SureClick 947 MG/ML Soaj Generic drug: Evolocumab       TAKE these medications    albuterol 108 (90 Base) MCG/ACT inhaler Commonly known as: VENTOLIN HFA Inhale 1-2 puffs into the lungs every 6 (six) hours as needed for wheezing or shortness of breath.   apixaban 5 MG Tabs tablet Commonly known as: ELIQUIS Take 1 tablet (5 mg total) by mouth 2 (two) times daily.   calcitRIOL 0.5 MCG capsule Commonly known as: ROCALTROL Take 1 capsule (0.5 mcg total) by mouth every Monday, Wednesday, and Friday. Start taking on: December 11, 2020   calcium acetate 667  MG capsule Commonly known as: PHOSLO Take 1,334 mg by mouth 3 (three) times daily.   ceFAZolin  IVPB Commonly known as: ANCEF Inject 2 g into the vein every Monday, Wednesday, and Friday with hemodialysis. Indication:  E. Coli bacteremia First Dose: Yes Last Day of Therapy:  01/15/2021  Labs - Once weekly:  CBC/D and BMP, Labs - Every other week:  ESR and CRP Method of administration: IV Push Method of administration may be changed at the discretion of home infusion pharmacist based upon assessment of the patient and/or caregiver's ability to self-administer the medication ordered.   ferrous sulfate 325 (65 FE) MG tablet Commonly known as: FerrouSul Take 1 tablet (325 mg total) by mouth daily with breakfast.   fluticasone 50 MCG/ACT nasal spray Commonly known as: FLONASE Place 2 sprays into both nostrils daily as needed for allergies.   insulin aspart 100 UNIT/ML injection Commonly known as: novoLOG Inject 2 Units into the skin 3 (three) times daily with meals. Give if eats  50% or more of meal.   insulin glargine-yfgn 100 UNIT/ML injection Commonly known as: SEMGLEE Inject 0.1 mLs (10 Units total) into the skin daily at 10 pm.   pantoprazole 40 MG tablet Commonly known as: PROTONIX Take 1 tablet (40 mg total) by mouth daily.   silver sulfADIAZINE 1 % cream Commonly known as: SILVADENE Apply topically 2 (two) times daily.   vancomycin  IVPB Inject 1,250 mg into the vein every Monday, Wednesday, and Friday with hemodialysis. Indication:  MRSA bacteremia First Dose: Yes Last Day of Therapy:  01/15/2021 Labs - $Remo"Sunday/Monday:  CBC/D, BMP, and vancomycin trough. Labs - Thursday:  BMP and vancomycin trough Labs - Every other week:  ESR and CRP Method of administration:Elastomeric Method of administration may be changed at the discretion of the patient and/or caregiver's ability to self-administer the medication ordered.               Durable Medical Equipment  (From admission, onward)           Start     Ordered   11/29/2020 1233  For home use only DME oxygen  Once       Question Answer Comment  Length of Need Lifetime   Mode or (Route) Nasal cannula   Liters per Minute 4   Frequency Continuous (stationary and portable oxygen unit needed)   Oxygen conserving device Yes   Oxygen delivery system Gas      11/21/2020 1233              Discharge Care Instructions  (From admission, onward)           Start     Ordered   11/17/2020 0000  Change dressing on IV access line weekly and PRN  (Home infusion instructions - [facility] )        10"hjguu$ /28/22 1241            Contact information for follow-up providers     Virl Cagey, MD Follow up on 12/21/2020.   Specialty: General Surgery Why: wound check Contact information: [address] [place] [facility] [address] [phone]         [facility]. Schedule an appointment as soon as possible for a visit in 1 month(s).    Specialty: Infectious Diseases Why: Hospital Follow Up for severe infection Contact information: [address] [place], [place] [id number] [place] [phone]  [facility]. Schedule an appointment as soon as possible for a visit in 3 week(s).   Specialty: Vascular Surgery Why: Hospital Follow Up Contact information: [address] [place], [place]. Schedule an appointment as soon as possible for a visit in 1 month(s).   Specialty: Gastroenterology Why: Hospital Follow Up Contact information: [address], [address] [place] [address] [phone]              Contact information for after-discharge care     [facility] .   Service: Skilled Nursing Contact information: [address] [address] [phone]                    Allergies  Allergen Reactions   Latex Hives and Rash   Lipitor [Atorvastatin]     Leg aches    Allergies as of 11/17/2020       Reactions   Latex Hives, Rash   Lipitor [atorvastatin]    Leg aches        Medication List     STOP taking these medications    clopidogrel 75 MG tablet Commonly known as: PLAVIX   fluorouracil 5 % cream Commonly known as: EFUDEX   levocetirizine 5 MG tablet Commonly known as: XYZAL   Repatha SureClick 023 MG/ML Soaj Generic drug: Evolocumab       TAKE these medications    albuterol 108 (90 Base) MCG/ACT inhaler Commonly known as: VENTOLIN HFA Inhale 1-2 puffs into the lungs every 6 (six) hours as needed for wheezing or shortness of breath.   apixaban 5 MG Tabs tablet Commonly known as: ELIQUIS Take 1 tablet (5 mg total) by mouth 2 (two) times daily.   calcitRIOL 0.5 MCG capsule Commonly known as: ROCALTROL Take 1 capsule (0.5 mcg total) by mouth every Monday,  Wednesday, and Friday. Start taking on: December 11, 2020   calcium acetate 667 MG capsule Commonly known as: PHOSLO Take 1,334 mg by mouth 3 (three) times daily.   ceFAZolin  IVPB Commonly known as: ANCEF Inject 2 g into the vein every Monday, Wednesday, and Friday with hemodialysis. Indication:  E. Coli bacteremia First Dose: Yes Last Day of Therapy:  01/15/2021  Labs - Once weekly:  CBC/D and BMP, Labs - Every other week:  ESR and CRP Method of administration: IV Push Method of administration may be changed at the discretion of home infusion pharmacist based upon assessment of the patient and/or caregiver's ability to self-administer the medication ordered.   ferrous sulfate 325 (65 FE) MG tablet Commonly known as: FerrouSul Take 1 tablet (325 mg total) by mouth daily with breakfast.   fluticasone 50 MCG/ACT nasal spray Commonly known as: FLONASE Place 2 sprays into both nostrils daily as needed for allergies.   insulin aspart 100 UNIT/ML injection Commonly known as: novoLOG Inject 2 Units into the skin 3 (three) times daily with meals. Give if eats 50% or more of meal.   insulin glargine-yfgn 100 UNIT/ML injection Commonly known as: SEMGLEE Inject 0.1 mLs (10 Units total) into the skin daily at 10 pm.   pantoprazole 40 MG tablet Commonly known as: PROTONIX Take 1 tablet (40 mg total) by mouth daily.   silver sulfADIAZINE 1 % cream Commonly known as: SILVADENE Apply topically 2 (two) times daily.   vancomycin  IVPB Inject 1,250 mg  into the vein every Monday, Wednesday, and Friday with hemodialysis. Indication:  MRSA bacteremia First Dose: Yes Last Day of Therapy:  01/15/2021 Labs - $Remo"Sunday/Monday:  CBC/D, BMP, and vancomycin trough. Labs - Thursday:  BMP and vancomycin trough Labs - Every other week:  ESR and CRP Method of administration:Elastomeric Method of administration may be changed at the discretion of the patient and/or caregiver's ability to self-administer  the medication ordered.               Durable Medical Equipment  (From admission, onward)           Start     Ordered   12/10/2020 1233  For home use only DME oxygen  Once       Question Answer Comment  Length of Need Lifetime   Mode or (Route) Nasal cannula   Liters per Minute 4   Frequency Continuous (stationary and portable oxygen unit needed)   Oxygen conserving device Yes   Oxygen delivery system Gas      12/06/2020 1233              Discharge Care Instructions  (From admission, onward)           Start     Ordered   11/20/2020 0000  Change dressing on IV access line weekly and PRN  (Home infusion instructions - [facility] )        10"hVExK$ /28/22 1241            Procedures/Studies: DG Elbow 2 Views Right  Result Date: 11/29/2020 CLINICAL DATA:  Pain and swelling in the right elbow lung recent fall, initial encounter EXAM: RIGHT ELBOW - 2 VIEW COMPARISON:  None. FINDINGS: Mild degenerative changes of the right elbow joint are seen. No acute fracture or dislocation is noted. Small joint effusion is noted anteriorly. Olecranon spurring is seen. IMPRESSION: Small joint effusion without acute fracture. Electronically Signed   By: Inez Catalina M.D.   On: 11/29/2020 18:58   MR LUMBAR SPINE WO CONTRAST  Result Date: 12/05/2020 CLINICAL DATA:  Low back pain, infection suspected low back pain. Recurrent MRSA bacteremia EXAM: MRI LUMBAR SPINE WITHOUT CONTRAST TECHNIQUE: Multiplanar, multisequence MR imaging of the lumbar spine was performed. No intravenous contrast was administered. COMPARISON:  None. FINDINGS: Motion artifact is present. Segmentation: Standard. Alignment:  No significant listhesis. Vertebrae: Mildly decreased T1 marrow signal may reflect hematopoietic marrow. No substantial marrow edema. No suspicious osseous lesion Conus medullaris and cauda equina: Conus extends to the L1 level. Conus and cauda equina appear normal. Paraspinal and other soft  tissues: Unremarkable. Disc levels: L1-L2:  No canal or foraminal stenosis. L2-L3: Disc bulge. Facet arthropathy with ligamentum flavum infolding. No canal stenosis. Mild foraminal stenosis. L3-L4: Level was not included on axial imaging. On sagittal imaging, there is a disc bulge and central annular fissure as well as facet arthropathy. Likely at least mild canal stenosis. Mild to moderate right and mild left foraminal stenosis. L4-L5: Disc bulge. Marked facet arthropathy with ligamentum flavum infolding and joint effusions with extra-spinal synovial cyst formation. Marked canal stenosis with effacement of subarticular recesses. Mild to moderate foraminal stenosis. L5-S1: Disc bulge with small central annular fissure. No canal or foraminal stenosis. Signal within the right lateral epidural space may reflect traversing vessels. IMPRESSION: Degraded by motion.  No evidence of discitis/osteomyelitis. Multilevel degenerative changes as detailed above. There is marked canal stenosis at L4-L5 with effacement of subarticular recesses. Facet arthropathy is also greatest at this level. The  L3-L4 level was not included on axial imaging due to technical error. There is likely at least mild canal stenosis, mild to moderate right foraminal stenosis, and mild left foraminal stenosis. Facet arthropathy is present at this level. If desired, patient can be brought back for repeat scanning without charge. Electronically Signed   By: Macy Mis M.D.   On: 12/05/2020 17:26   US Abdomen Complete  Result Date: 12/04/2020 CLINICAL DATA:  Elevated LFT EXAM: ABDOMEN ULTRASOUND COMPLETE COMPARISON:  Renal ultrasound 04/18/2017 FINDINGS: Gallbladder: No shadowing stones. Small fluid around the gallbladder. Negative sonographic Murphy. Increased wall thickness of 5.2 mm. Common bile duct: Diameter: 4.8 mm Liver: No focal lesion identified. Within normal limits in parenchymal echogenicity. Portal vein is patent on color Doppler  imaging with normal direction of blood flow towards the liver. IVC: No abnormality visualized. Pancreas: Obscured by bowel gas Spleen: Slightly enlarged with volume of 526 mL. Right Kidney: Length: 12.3 cm.  Lower pole cyst measuring 2.2 cm. Left Kidney: Length: 13.3 cm. Echogenicity within normal limits. No mass or hydronephrosis visualized. Abdominal aorta: No aneurysm visualized. Other findings: Incidental bilateral pleural effusions. Small amount of ascites within the abdomen. IMPRESSION: 1. Negative for gallstones. Slight increased gallbladder wall thickness but negative sonographic Percell Miller, findings are nonspecific. There is a small amount of fluid adjacent to the gallbladder likely related to generalized small ascites in the right upper quadrant. 2. Liver grossly within normal limits. 3. Mild splenomegaly 4. Small amount of ascites within the upper and lower abdomen. 5. Bilateral pleural effusions Electronically Signed   By: Donavan Foil M.D.   On: 12/04/2020 16:00   MR FOOT RIGHT WO CONTRAST  Result Date: 12/01/2020 CLINICAL DATA:  Right foot ulcer. EXAM: MRI OF THE RIGHT FOREFOOT WITHOUT CONTRAST TECHNIQUE: Multiplanar, multisequence MR imaging of the right forefoot was performed. No intravenous contrast was administered. COMPARISON:  Right foot x-rays dated November 27, 2020. FINDINGS: Bones/Joint/Cartilage Faint marrow edema the fifth metatarsal head. Remaining marrow signal is normal. No fracture or dislocation. Mild second TMT joint osteoarthritis. No joint effusion. Ligaments Collateral ligaments are intact.  Lisfranc ligament is intact. Muscles and Tendons Flexor and extensor tendons are intact. Increased T2 signal within the intrinsic muscles of the forefoot, nonspecific, but likely related to diabetic muscle changes. Soft tissue Diffuse soft tissue swelling. Soft tissue ulceration along the lateral forefoot near the fifth metatarsal head with underlying 2.1 x 0.4 x 2.3 cm curvilinear fluid  collection (series 8, image 26). No soft tissue mass. IMPRESSION: 1. Soft tissue ulceration along the lateral forefoot near the fifth metatarsal head with underlying 2.3 cm curvilinear fluid collection, concerning for abscess. 2. Faint marrow edema in the fifth metatarsal head is nonspecific, but concerning for early osteomyelitis given adjacent abscess and soft tissue ulcer. Electronically Signed   By: Titus Dubin M.D.   On: 12/01/2020 14:16   DG Chest Port 1 View  Result Date: 11/29/2020 CLINICAL DATA:  Removal of Port-A-Cath EXAM: PORTABLE CHEST 1 VIEW COMPARISON:  11/27/2020 FINDINGS: There is been interval removal of hemodialysis catheter on the right. No visible retained fragments. Prior CABG. Cardiomegaly. Vascular congestion. Bilateral layering effusions and lower lobe airspace opacity. Probable mild edema/CHF. IMPRESSION: Interval removal of right hemodialysis catheter. CHF.  Layering effusions. Electronically Signed   By: Rolm Baptise M.D.   On: 11/29/2020 16:58   DG Chest Port 1 View  Result Date: 11/27/2020 CLINICAL DATA:  Fall.  Chest and right foot pain. EXAM: PORTABLE CHEST 1 VIEW COMPARISON:  09/01/2020 FINDINGS: Unchanged mild cardiomegaly. Unchanged mild pulmonary vascular congestion. Small bilateral pleural effusions are not significantly changed since prior examination. Interval placement of dialysis catheter. Median sternotomy changes again noted. IMPRESSION: No acute abnormality of the chest. Unchanged findings of mild CHF/fluid volume overload. Electronically Signed   By: Miachel Roux M.D.   On: 11/27/2020 11:24   DG Foot 2 Views Right  Result Date: 11/27/2020 CLINICAL DATA:  wound to lateral fore foot EXAM: RIGHT FOOT - 2 VIEW COMPARISON:  Radiograph 08/18/2020 FINDINGS: There is no evidence of acute fracture or dislocation. Unchanged bulky ossification along the base of the fifth metatarsal, possibly from prior trauma. There is a lateral forefoot soft tissue defect. There  is no frank bony destruction radiographically. Mild midfoot and forefoot degenerative changes. Plantar and dorsal calcaneal spurring. Vascular calcifications. IMPRESSION: Lateral forefoot soft tissue defect. No frank bony destruction radiographically to suggest osteomyelitis. MRI would be more sensitive. Electronically Signed   By: Maurine Simmering M.D.   On: 11/27/2020 11:32   ECHO TEE  Result Date: 12/05/2020    TRANSESOPHOGEAL ECHO REPORT   Patient Name:   FRENCH KENDRA [facility] Date of Exam: 12/05/2020 Medical Rec #:  [id number]           Height:       74.0 in Accession #:    [id number]          Weight:       252.6 lb Date of Birth:  1950-09-14           BSA:          2.400 m Patient Age:    49 years            BP:           132/59 mmHg Patient Gender: M                   HR:           78 bpm. Exam Location:  [facility] Procedure: Transesophageal Echo, Color Doppler and Cardiac Doppler Indications:    Bacteremia R78.81  History:        Patient has prior history of Echocardiogram examinations, most                 recent 11/28/2020. CAD, Prior CABG, Arrythmias:Atrial                 Fibrillation; Risk Factors:Hypertension, Diabetes and                 Dyslipidemia. ESRD (end stage renal disease).  Sonographer:    Alvino Chapel RCS Referring Phys: [id number] PAULA V ROSS PROCEDURE: The transesophogeal probe was passed without difficulty through the esophogus of the patient. Sedation performed by different physician. The patient developed no complications during the procedure. IMPRESSIONS  1. Left ventricular ejection fraction, by estimation, is 50 to 55%. The left ventricle has low normal function.  2. Right ventricular systolic function is low normal. The right ventricular size is moderately enlarged.  3. Left atrial size was severely dilated. No left atrial/left atrial appendage thrombus was detected. The LAA emptying velocity was 65 cm/s.  4. Right atrial size was severely dilated.  5. The mitral valve is normal in  structure. Mild mitral valve regurgitation. No evidence of mitral stenosis.  6. The tricuspid valve is abnormal. Tricuspid valve regurgitation is moderate.  7. The aortic valve is tricuspid. There is mild calcification of the aortic valve. Aortic valve regurgitation is  not visualized. No aortic stenosis is present. Conclusion(s)/Recommendation(s): No evidence of vegetation/infective endocarditis on this transesophageal echocardiogram. FINDINGS  Left Ventricle: Left ventricular ejection fraction, by estimation, is 50 to 55%. The left ventricle has low normal function. The left ventricular internal cavity size was normal in size. Right Ventricle: The right ventricular size is moderately enlarged. Right vetricular wall thickness was not assessed. Right ventricular systolic function is low normal. Left Atrium: Left atrial size was severely dilated. No left atrial/left atrial appendage thrombus was detected. The LAA emptying velocity was 65 cm/s. Right Atrium: Right atrial size was severely dilated. Pericardium: There is no evidence of pericardial effusion. Mitral Valve: The mitral valve is normal in structure. Mild mitral valve regurgitation. No evidence of mitral valve stenosis. Tricuspid Valve: The tricuspid valve is abnormal. Tricuspid valve regurgitation is moderate . No evidence of tricuspid stenosis. Aortic Valve: The aortic valve is tricuspid. There is mild calcification of the aortic valve. Aortic valve regurgitation is not visualized. No aortic stenosis is present. Pulmonic Valve: The pulmonic valve was not well visualized. Pulmonic valve regurgitation is trivial. No evidence of pulmonic stenosis. Aorta: The aortic root is normal in size and structure. There is minimal (Grade I) plaque. IAS/Shunts: No atrial level shunt detected by color flow Doppler. Carlyle Dolly MD Electronically signed by Carlyle Dolly MD Signature Date/Time: 12/05/2020/4:24:16 PM    Final    ECHOCARDIOGRAM LIMITED  Result Date:  11/28/2020    ECHOCARDIOGRAM LIMITED REPORT   Patient Name:   KAILEN HINKLE [facility] Date of Exam: 11/28/2020 Medical Rec #:  [id number]           Height:       74.0 in Accession #:    [id number]          Weight:       261.9 lb Date of Birth:  January 04, 1951           BSA:          2.437 m Patient Age:    52 years            BP:           124/56 mmHg Patient Gender: M                   HR:           72 bpm. Exam Location:  [facility] Procedure: Limited Echo, Color Doppler and Cardiac Doppler Indications:    Dyspnea R06.00  History:        Patient has prior history of Echocardiogram examinations, most                 recent 06/06/2020. CAD, Prior CABG, Arrythmias:Atrial                 Fibrillation, Signs/Symptoms:Shortness of Breath; Risk                 Factors:Hypertension, Diabetes and Dyslipidemia.  Sonographer:    Alvino Chapel RCS Referring Phys: [id number] [place]  1. Left ventricular ejection fraction, by estimation, is 45 to 50%. The left ventricle has mildly decreased function. The left ventricle demonstrates regional wall motion abnormalities (see scoring diagram/findings for description). There is severe asymmetric left ventricular hypertrophy of the basal and septal segments. Left ventricular diastolic parameters are indeterminate. There is the interventricular septum is flattened in systole and diastole, consistent with right ventricular pressure and volume overload.  2. Right ventricular systolic function is low normal. The right ventricular  size is mildly enlarged. There is moderately elevated pulmonary artery systolic pressure. The estimated right ventricular systolic pressure is 05.3 mmHg.  3. Left atrial size was mildly dilated.  4. Right atrial size was mildly dilated.  5. Left pleural effusion evident. A small pericardial effusion is present. The pericardial effusion is posterior to the left ventricle.  6. The mitral valve is abnormal. Mild mitral valve regurgitation.  7. The  aortic valve is tricuspid. There is moderate calcification of the aortic valve. Aortic valve regurgitation is not visualized.  8. The inferior vena cava is dilated in size with <50% respiratory variability, suggesting right atrial pressure of 15 mmHg. Comparison(s): Prior images reviewed side by side. Reduced LVEF and wall motion abnormalities are not new based on my review of the prior study. FINDINGS  Left Ventricle: Left ventricular ejection fraction, by estimation, is 45 to 50%. The left ventricle has mildly decreased function. The left ventricle demonstrates regional wall motion abnormalities. Definity contrast agent was given IV to delineate the left ventricular endocardial borders. There is severe asymmetric left ventricular hypertrophy of the basal and septal segments. The interventricular septum is flattened in systole and diastole, consistent with right ventricular pressure and volume overload. Left ventricular diastolic parameters are indeterminate. Left ventricular diastolic function could not be evaluated due to atrial fibrillation.  LV Wall Scoring: The basal inferior segment is akinetic. The posterior wall, mid inferoseptal segment, mid inferior segment, and basal inferoseptal segment are hypokinetic. The entire anterior wall, antero-lateral wall, entire anterior septum, and entire apex are normal. Right Ventricle: The right ventricular size is mildly enlarged. No increase in right ventricular wall thickness. Right ventricular systolic function is low normal. There is moderately elevated pulmonary artery systolic pressure. The tricuspid regurgitant  velocity is 3.12 m/s, and with an assumed right atrial pressure of 15 mmHg, the estimated right ventricular systolic pressure is 97.6 mmHg. Left Atrium: Left atrial size was mildly dilated. Right Atrium: Right atrial size was mildly dilated. Pericardium: Left pleural effusion evident. A small pericardial effusion is present. The pericardial effusion is  posterior to the left ventricle. Mitral Valve: The mitral valve is abnormal. There is mild thickening of the mitral valve leaflet(s). There is mild calcification of the mitral valve leaflet(s). Mild mitral valve regurgitation. Tricuspid Valve: The tricuspid valve is grossly normal. Tricuspid valve regurgitation is mild. Aortic Valve: The aortic valve is tricuspid. There is moderate calcification of the aortic valve. There is mild to moderate aortic valve annular calcification. Aortic valve regurgitation is not visualized. Aorta: The aortic root is normal in size and structure. Venous: The inferior vena cava is dilated in size with less than 50% respiratory variability, suggesting right atrial pressure of 15 mmHg. IAS/Shunts: No atrial level shunt detected by color flow Doppler. LEFT VENTRICLE PLAX 2D LVIDd:         5.50 cm LVIDs:         4.30 cm LV PW:         1.30 cm LV IVS:        1.80 cm LVOT diam:     2.30 cm LVOT Area:     4.15 cm  RIGHT VENTRICLE TAPSE (M-mode): 1.7 cm LEFT ATRIUM              Index        RIGHT ATRIUM           Index LA diam:        4.20 cm  1.72 cm/m   RA Area:  27.30 cm LA Vol (A2C):   102.0 ml 41.85 ml/m  RA Volume:   96.50 ml  39.59 ml/m LA Vol (A4C):   74.5 ml  30.57 ml/m LA Biplane Vol: 91.8 ml  37.66 ml/m   AORTA Ao Root diam: 3.80 cm MITRAL VALVE                TRICUSPID VALVE MV Area (PHT): 5.02 cm     TR Peak grad:   38.9 mmHg MV Decel Time: 151 msec     TR Vmax:        312.00 cm/s MV E velocity: 143.00 cm/s                             SHUNTS                             Systemic Diam: 2.30 cm Rozann Lesches MD Electronically signed by Rozann Lesches MD Signature Date/Time: 11/28/2020/1:03:27 PM    Final      Subjective: Pt reports feeling better today, no specific complaints.  Agreeable to go to SNF.   Discharge Exam: Vitals:   11/25/2020 1215 11/24/2020 1245  BP: 117/61 (!) 112/51  Pulse: 71 78  Resp:    Temp:    SpO2:     Vitals:   12/09/2020 1145 11/30/2020  1200 12/10/2020 1215 12/01/2020 1245  BP: 116/60 125/64 117/61 (!) 112/51  Pulse: 75 77 71 78  Resp:      Temp:      TempSrc:      SpO2:      Weight:      Height:       General:  Pt is chronically ill appearing, he is alert, follows commands appropriately and answers questions, not in acute distress. HEENT: No icterus, No thrush, No neck mass, [place]/AT Cardiovascular: normal S1/S2, no rubs, no gallops Respiratory: bibasilar rales.  No wheeze Abdomen: Soft/+BS, non tender, non distended, no guarding Extremities: right foot exam, wound debrided by surgeon, good blood flow seen and 1+ pedal pulse on feet, No edema, No lymphangitis, No petechiae, No rashes, no synovitis    The results of significant diagnostics from this hospitalization (including imaging, microbiology, ancillary and laboratory) are listed below for reference.     Microbiology: Recent Results (from the past 240 hour(s))  Culture, blood (Routine X 2) w Reflex to ID Panel     Status: Abnormal   Collection Time: 11/30/20  4:53 AM   Specimen: BLOOD RIGHT HAND  Result Value Ref Range Status   Specimen Description   Final    BLOOD RIGHT HAND Performed at [facility], [address], [place], [place] [address]    Special Requests   Final    BOTTLES DRAWN AEROBIC AND ANAEROBIC Blood Culture adequate volume Performed at [place] [place], [place] [address]    Culture  Setup Time   Final    GRAM NEGATIVE RODS AEROBIC BOTTLE ONLY Gram Stain Report Called to,Read Back By and Verified With: EVANS T.,2003,11/30/2020,SELF S CRITICAL RESULT CALLED TO, READ BACK BY AND VERIFIED WITH: S,HURTH PHARMD $RemoveBefore'@1216'[id number]$  12/01/20 EB Performed at [facility], [place] [address], [place], [place] [address]    Culture ESCHERICHIA COLI (A)  Final   Report Status 12/02/2020 FINAL  Final   Organism ID, Bacteria ESCHERICHIA COLI  Final      Susceptibility  Escherichia coli - MIC*    AMPICILLIN >=32 RESISTANT Resistant      CEFAZOLIN <=4 SENSITIVE Sensitive     CEFEPIME <=0.12 SENSITIVE Sensitive     CEFTAZIDIME <=1 SENSITIVE Sensitive     CEFTRIAXONE <=0.25 SENSITIVE Sensitive     CIPROFLOXACIN <=0.25 SENSITIVE Sensitive     GENTAMICIN >=16 RESISTANT Resistant     IMIPENEM <=0.25 SENSITIVE Sensitive     TRIMETH/SULFA >=320 RESISTANT Resistant     AMPICILLIN/SULBACTAM >=32 RESISTANT Resistant     PIP/TAZO <=4 SENSITIVE Sensitive     * ESCHERICHIA COLI  Culture, blood (Routine X 2) w Reflex to ID Panel     Status: None   Collection Time: 11/30/20  4:53 AM   Specimen: BLOOD RIGHT ARM  Result Value Ref Range Status   Specimen Description BLOOD RIGHT ARM  Final   Special Requests   Final    BOTTLES DRAWN AEROBIC AND ANAEROBIC Blood Culture results may not be optimal due to an excessive volume of blood received in culture bottles   Culture   Final    NO GROWTH 5 DAYS Performed at [facility], [address], [place], [place] [address]    Report Status 12/05/2020 FINAL  Final  Blood Culture ID Panel (Reflexed)     Status: Abnormal   Collection Time: 11/30/20  4:53 AM  Result Value Ref Range Status   Enterococcus faecalis NOT DETECTED NOT DETECTED Final   Enterococcus Faecium NOT DETECTED NOT DETECTED Final   Listeria monocytogenes NOT DETECTED NOT DETECTED Final   Staphylococcus species NOT DETECTED NOT DETECTED Final   Staphylococcus aureus (BCID) NOT DETECTED NOT DETECTED Final   Staphylococcus epidermidis NOT DETECTED NOT DETECTED Final   Staphylococcus lugdunensis NOT DETECTED NOT DETECTED Final   Streptococcus species NOT DETECTED NOT DETECTED Final   Streptococcus agalactiae NOT DETECTED NOT DETECTED Final   Streptococcus pneumoniae NOT DETECTED NOT DETECTED Final   Streptococcus pyogenes NOT DETECTED NOT DETECTED Final   A.calcoaceticus-baumannii NOT DETECTED NOT DETECTED Final   Bacteroides fragilis NOT DETECTED NOT DETECTED Final   Enterobacterales DETECTED (A) NOT DETECTED Final    Comment:  Enterobacterales represent a large order of gram negative bacteria, not a single organism. CRITICAL RESULT CALLED TO, READ BACK BY AND VERIFIED WITH: S,HURTH PHARMD $RemoveBefore'@1216'[id number]$  12/01/20 EB    Enterobacter cloacae complex NOT DETECTED NOT DETECTED Final   Escherichia coli DETECTED (A) NOT DETECTED Final    Comment: CRITICAL RESULT CALLED TO, READ BACK BY AND VERIFIED WITH: S,HURTH PHARMD $RemoveBefore'@1216'[id number]$  12/01/20 EB    Klebsiella aerogenes NOT DETECTED NOT DETECTED Final   Klebsiella oxytoca NOT DETECTED NOT DETECTED Final   Klebsiella pneumoniae NOT DETECTED NOT DETECTED Final   Proteus species NOT DETECTED NOT DETECTED Final   Salmonella species NOT DETECTED NOT DETECTED Final   Serratia marcescens NOT DETECTED NOT DETECTED Final   Haemophilus influenzae NOT DETECTED NOT DETECTED Final   Neisseria meningitidis NOT DETECTED NOT DETECTED Final   Pseudomonas aeruginosa NOT DETECTED NOT DETECTED Final   Stenotrophomonas maltophilia NOT DETECTED NOT DETECTED Final   Candida albicans NOT DETECTED NOT DETECTED Final   Candida auris NOT DETECTED NOT DETECTED Final   Candida glabrata NOT DETECTED NOT DETECTED Final   Candida krusei NOT DETECTED NOT DETECTED Final   Candida parapsilosis NOT DETECTED NOT DETECTED Final   Candida tropicalis NOT DETECTED NOT DETECTED Final   Cryptococcus neoformans/gattii NOT DETECTED NOT DETECTED Final   CTX-M ESBL NOT DETECTED NOT DETECTED Final   Carbapenem  resistance IMP NOT DETECTED NOT DETECTED Final   Carbapenem resistance KPC NOT DETECTED NOT DETECTED Final   Carbapenem resistance NDM NOT DETECTED NOT DETECTED Final   Carbapenem resist OXA 48 LIKE NOT DETECTED NOT DETECTED Final   Carbapenem resistance VIM NOT DETECTED NOT DETECTED Final    Comment: Performed at [facility], [place] [address] [place], [place] [address]  Culture, blood (Routine X 2) w Reflex to ID Panel     Status: Abnormal   Collection Time: 12/01/20 12:46 PM   Specimen: BLOOD  Result Value Ref  Range Status   Specimen Description   Final    BLOOD DRAWN BY DIALYSIS DRAWN BY RN Performed at [facility], [address], [place], [place] [address]    Special Requests   Final    BOTTLES DRAWN AEROBIC AND ANAEROBIC Blood Culture adequate volume Performed at [facility], [address], [place], [place] [address]    Culture  Setup Time   Final    GRAM POSITIVE COCCI both anaerobic and aerobic bottles Gram Stain Report Called to,Read Back By and Verified With: Rhew,[email or web address]  by Matthews,B 10.22.22 Performed at [facility], [address] [place], [place] [address]    Culture (A)  Final    STAPHYLOCOCCUS AUREUS SUSCEPTIBILITIES PERFORMED ON PREVIOUS CULTURE WITHIN THE LAST 5 DAYS. Performed at [facility], [place] [address], [place], [place] [address]    Report Status 12/04/2020 FINAL  Final  Culture, blood (Routine X 2) w Reflex to ID Panel     Status: Abnormal   Collection Time: 12/01/20 12:55 PM   Specimen: BLOOD  Result Value Ref Range Status   Specimen Description   Final    BLOOD DRAWN BY DIALYSIS DRAWN BY RN Performed at [facility], [address], [place], [place] [address]    Special Requests   Final    BOTTLES DRAWN AEROBIC AND ANAEROBIC Blood Culture adequate volume Performed at [facility], [address] [place], [place] [address]    Culture  Setup Time   Final    GRAM POSITIVE COCCI ANAEROBIC BOTTLE ONLY Gram Stain Report Called to,Read Back By and Verified With: HUMPHRIES @ 1600 ON 102222 BY HENDERSON L CRITICAL VALUE NOTED.  VALUE IS CONSISTENT WITH PREVIOUSLY REPORTED AND CALLED VALUE.    Culture (A)  Final    STAPHYLOCOCCUS AUREUS SUSCEPTIBILITIES PERFORMED ON PREVIOUS CULTURE WITHIN THE LAST 5 DAYS. Performed at [facility], [place] [address] [place], [place] [address]    Report Status 12/04/2020 FINAL  Final  Culture, blood (routine x 2)     Status: Abnormal   Collection Time: 12/03/20  1:03 PM   Specimen: BLOOD RIGHT HAND  Result Value  Ref Range Status   Specimen Description   Final    BLOOD RIGHT HAND BOTTLES DRAWN AEROBIC AND ANAEROBIC Performed at [facility], [address] [place], [place] [address]    Special Requests   Final    Blood Culture adequate volume Performed at [facility], [address], [place], [place] [address]    Culture  Setup Time   Final    GRAM NEGATIVE RODS IN BOTH AEROBIC AND ANAEROBIC BOTTLES Gram Stain Report Called to,Read Back By and Verified With: [place] Goh,[email or web address]  by matthews, b 10.24.22 CRITICAL VALUE NOTED.  VALUE IS CONSISTENT WITH PREVIOUSLY REPORTED AND CALLED VALUE. Performed at [facility], [address], [place], [place] [address]    Culture (A)  Final    ESCHERICHIA COLI SUSCEPTIBILITIES PERFORMED  ON PREVIOUS CULTURE WITHIN THE LAST 5 DAYS. Performed at [facility], [place] [address], [place], [place] [address]    Report Status 12/05/2020 FINAL  Final  Culture, blood (routine x 2)     Status: Abnormal   Collection Time: 12/03/20  1:09 PM   Specimen: Right Antecubital; Blood  Result Value Ref Range Status   Specimen Description   Final    RIGHT ANTECUBITAL BOTTLES DRAWN AEROBIC AND ANAEROBIC Performed at [facility], [address], [place], [place] [address]    Special Requests   Final    Blood Culture adequate volume Performed at [facility], [address], [place], [place] [address]    Culture  Setup Time   Final    GRAM POSITIVE COCCI ANAEROBIC BOTTLE ONLY GRAM NEGATIVE RODS AEROBIC BOTTLE ONLY Gram Stain Report Called to,Read Back By and Verified With: BOTH ORGANISMS, MORRIS,C AT 0940 ON 10.24.22 BY RUCINSKI,B CRITICAL VALUE NOTED.  VALUE IS CONSISTENT WITH PREVIOUSLY REPORTED AND CALLED VALUE.    Culture (A)  Final    STAPHYLOCOCCUS AUREUS ESCHERICHIA COLI SUSCEPTIBILITIES PERFORMED ON PREVIOUS CULTURE WITHIN THE LAST 5 DAYS. Performed at [facility], [place] [address] [place], [place] [address]    Report Status 12/06/2020 FINAL  Final  Culture,  blood (Routine X 2) w Reflex to ID Panel     Status: None (Preliminary result)   Collection Time: 12/05/20  5:29 AM   Specimen: BLOOD RIGHT HAND  Result Value Ref Range Status   Specimen Description BLOOD RIGHT HAND  Final   Special Requests   Final    BOTTLES DRAWN AEROBIC ONLY Blood Culture results may not be optimal due to an inadequate volume of blood received in culture bottles   Culture   Final    NO GROWTH 3 DAYS Performed at [facility], [address] [place], [place] [address]    Report Status PENDING  Incomplete  Culture, blood (Routine X 2) w Reflex to ID Panel     Status: None (Preliminary result)   Collection Time: 12/05/20  5:29 AM   Specimen: BLOOD RIGHT ARM  Result Value Ref Range Status   Specimen Description BLOOD RIGHT ARM  Final   Special Requests   Final    BOTTLES DRAWN AEROBIC AND ANAEROBIC Blood Culture results may not be optimal due to an excessive volume of blood received in culture bottles   Culture   Final    NO GROWTH 3 DAYS Performed at [facility], [address] [place], [place] [address]    Report Status PENDING  Incomplete     Labs: BNP (last 3 results) Recent Labs    09/01/20 1820 11/27/20 1008  BNP 1,761.4* 9,166.0*   Basic Metabolic Panel: Recent Labs  Lab 12/04/20 0341 12/05/20 0757 12/06/20 1406 11/23/2020 0436  NA 129* 129* 127* 126*  K 4.2 4.0 4.1 4.4  CL 91* 92* 91* 89*  CO2 $Re'23 26 25 25  'ZYp$ GLUCOSE 283* 237* 244* 191*  BUN 97* 71* 86* 77*  CREATININE 7.55* 6.28* 7.38* 6.51*  CALCIUM 8.1* 8.0* 8.3* 8.7*  PHOS  --   --  5.7* 5.4*   Liver Function Tests: Recent Labs  Lab 12/02/20 0435 12/04/20 0341 12/05/20 0757 12/06/20 1406 11/18/2020 0436  AST 347* 138* 88*  --   --   ALT 130* 180* 141*  --   --   ALKPHOS 174* 194* 184*  --   --   BILITOT 1.6* 0.8 0.8  --   --  PROT 6.9 6.0* 6.5  --   --   ALBUMIN 3.1* 2.6* 2.6* 2.4* 2.6*   No results for input(s): LIPASE, AMYLASE in the last 168 hours. No results for input(s):  AMMONIA in the last 168 hours. CBC: Recent Labs  Lab 12/04/20 0341 12/05/20 0757 12/06/20 0558 12/07/20 0518 11/13/2020 0436  WBC 13.9* 16.0* 15.6* 16.2* 13.9*  HGB 9.7* 10.5* 9.4* 9.7* 9.4*  HCT 31.5* 34.0* 31.0* 32.6* 30.3*  MCV 89.0 89.5 90.1 89.6 89.4  PLT 214 226 255 238 253   Cardiac Enzymes: No results for input(s): CKTOTAL, CKMB, CKMBINDEX, TROPONINI in the last 168 hours. BNP: Invalid input(s): POCBNP CBG: Recent Labs  Lab 12/07/20 1101 12/07/20 1606 12/07/20 2139 11/28/2020 0254 11/18/2020 0745  GLUCAP 219* 260* 143* 229* 156*   D-Dimer No results for input(s): DDIMER in the last 72 hours. Hgb A1c No results for input(s): HGBA1C in the last 72 hours. Lipid Profile No results for input(s): CHOL, HDL, LDLCALC, TRIG, CHOLHDL, LDLDIRECT in the last 72 hours. Thyroid function studies No results for input(s): TSH, T4TOTAL, T3FREE, THYROIDAB in the last 72 hours.  Invalid input(s): FREET3 Anemia work up No results for input(s): VITAMINB12, FOLATE, FERRITIN, TIBC, IRON, RETICCTPCT in the last 72 hours. Urinalysis    Component Value Date/Time   COLORURINE YELLOW 03/10/2017 1553   APPEARANCEUR HAZY (A) 03/10/2017 1553   LABSPEC 1.013 03/10/2017 1553   PHURINE 5.0 03/10/2017 1553   GLUCOSEU NEGATIVE 03/10/2017 1553   HGBUR NEGATIVE 03/10/2017 1553   BILIRUBINUR NEGATIVE 03/10/2017 1553   KETONESUR NEGATIVE 03/10/2017 1553   PROTEINUR 30 (A) 03/10/2017 1553   NITRITE NEGATIVE 03/10/2017 1553   LEUKOCYTESUR NEGATIVE 03/10/2017 1553   [facility] Invalid input(s): PROCALCITONIN,  WBC,  LACTICIDVEN Microbiology Recent Results (from the past 240 hour(s))  Culture, blood (Routine X 2) w Reflex to ID Panel     Status: Abnormal   Collection Time: 11/30/20  4:53 AM   Specimen: BLOOD RIGHT HAND  Result Value Ref Range Status   Specimen Description   Final    BLOOD RIGHT HAND Performed at [facility], [address] [place], [place] [address]    Special Requests    Final    BOTTLES DRAWN AEROBIC AND ANAEROBIC Blood Culture adequate volume Performed at [facility], [address] [place], [place] [address]    Culture  Setup Time   Final    GRAM NEGATIVE RODS AEROBIC BOTTLE ONLY Gram Stain Report Called to,Read Back By and Verified With: EVANS T.,2003,11/30/2020,SELF S CRITICAL RESULT CALLED TO, READ BACK BY AND VERIFIED WITH: S,HURTH PHARMD $RemoveBefore'@1216'[id number]$  12/01/20 EB Performed at [facility], [place] [address] [place], [place] [address]    Culture ESCHERICHIA COLI (A)  Final   Report Status 12/02/2020 FINAL  Final   Organism ID, Bacteria ESCHERICHIA COLI  Final      Susceptibility   Escherichia coli - MIC*    AMPICILLIN >=32 RESISTANT Resistant     CEFAZOLIN <=4 SENSITIVE Sensitive     CEFEPIME <=0.12 SENSITIVE Sensitive     CEFTAZIDIME <=1 SENSITIVE Sensitive     CEFTRIAXONE <=0.25 SENSITIVE Sensitive     CIPROFLOXACIN <=0.25 SENSITIVE Sensitive     GENTAMICIN >=16 RESISTANT Resistant     IMIPENEM <=0.25 SENSITIVE Sensitive     TRIMETH/SULFA >=320 RESISTANT Resistant     AMPICILLIN/SULBACTAM >=32 RESISTANT Resistant     PIP/TAZO <=4 SENSITIVE Sensitive     * ESCHERICHIA COLI  Culture, blood (Routine X 2) w  Reflex to ID Panel     Status: None   Collection Time: 11/30/20  4:53 AM   Specimen: BLOOD RIGHT ARM  Result Value Ref Range Status   Specimen Description BLOOD RIGHT ARM  Final   Special Requests   Final    BOTTLES DRAWN AEROBIC AND ANAEROBIC Blood Culture results may not be optimal due to an excessive volume of blood received in culture bottles   Culture   Final    NO GROWTH 5 DAYS Performed at [facility], [address] [place], [place] [address]    Report Status 12/05/2020 FINAL  Final  Blood Culture ID Panel (Reflexed)     Status: Abnormal   Collection Time: 11/30/20  4:53 AM  Result Value Ref Range Status   Enterococcus faecalis NOT DETECTED NOT DETECTED Final   Enterococcus Faecium NOT DETECTED NOT DETECTED Final   Listeria  monocytogenes NOT DETECTED NOT DETECTED Final   Staphylococcus species NOT DETECTED NOT DETECTED Final   Staphylococcus aureus (BCID) NOT DETECTED NOT DETECTED Final   Staphylococcus epidermidis NOT DETECTED NOT DETECTED Final   Staphylococcus lugdunensis NOT DETECTED NOT DETECTED Final   Streptococcus species NOT DETECTED NOT DETECTED Final   Streptococcus agalactiae NOT DETECTED NOT DETECTED Final   Streptococcus pneumoniae NOT DETECTED NOT DETECTED Final   Streptococcus pyogenes NOT DETECTED NOT DETECTED Final   A.calcoaceticus-baumannii NOT DETECTED NOT DETECTED Final   Bacteroides fragilis NOT DETECTED NOT DETECTED Final   Enterobacterales DETECTED (A) NOT DETECTED Final    Comment: Enterobacterales represent a large order of gram negative bacteria, not a single organism. CRITICAL RESULT CALLED TO, READ BACK BY AND VERIFIED WITH: S,HURTH PHARMD $RemoveBefore'@1216'[id number]$  12/01/20 EB    Enterobacter cloacae complex NOT DETECTED NOT DETECTED Final   Escherichia coli DETECTED (A) NOT DETECTED Final    Comment: CRITICAL RESULT CALLED TO, READ BACK BY AND VERIFIED WITH: S,HURTH PHARMD $RemoveBefore'@1216'[id number]$  12/01/20 EB    Klebsiella aerogenes NOT DETECTED NOT DETECTED Final   Klebsiella oxytoca NOT DETECTED NOT DETECTED Final   Klebsiella pneumoniae NOT DETECTED NOT DETECTED Final   Proteus species NOT DETECTED NOT DETECTED Final   Salmonella species NOT DETECTED NOT DETECTED Final   Serratia marcescens NOT DETECTED NOT DETECTED Final   Haemophilus influenzae NOT DETECTED NOT DETECTED Final   Neisseria meningitidis NOT DETECTED NOT DETECTED Final   Pseudomonas aeruginosa NOT DETECTED NOT DETECTED Final   Stenotrophomonas maltophilia NOT DETECTED NOT DETECTED Final   Candida albicans NOT DETECTED NOT DETECTED Final   Candida auris NOT DETECTED NOT DETECTED Final   Candida glabrata NOT DETECTED NOT DETECTED Final   Candida krusei NOT DETECTED NOT DETECTED Final   Candida parapsilosis NOT DETECTED NOT DETECTED Final    Candida tropicalis NOT DETECTED NOT DETECTED Final   Cryptococcus neoformans/gattii NOT DETECTED NOT DETECTED Final   CTX-M ESBL NOT DETECTED NOT DETECTED Final   Carbapenem resistance IMP NOT DETECTED NOT DETECTED Final   Carbapenem resistance KPC NOT DETECTED NOT DETECTED Final   Carbapenem resistance NDM NOT DETECTED NOT DETECTED Final   Carbapenem resist OXA 48 LIKE NOT DETECTED NOT DETECTED Final   Carbapenem resistance VIM NOT DETECTED NOT DETECTED Final    Comment: Performed at [facility], [address], [place], [place] [address]  Culture, blood (Routine X 2) w Reflex to ID Panel     Status: Abnormal   Collection Time: 12/01/20 12:46 PM   Specimen: BLOOD  Result Value Ref Range Status   Specimen Description  Final    BLOOD DRAWN BY DIALYSIS DRAWN BY RN Performed at [facility], [address] [place], [place] [address]    Special Requests   Final    BOTTLES DRAWN AEROBIC AND ANAEROBIC Blood Culture adequate volume Performed at [facility], [address], [place], [place] [address]    Culture  Setup Time   Final    GRAM POSITIVE COCCI both anaerobic and aerobic bottles Gram Stain Report Called to,Read Back By and Verified With: Rhew,J@0545  by Matthews,B 10.22.22 Performed at [facility], [address], [place], [place] [address]    Culture (A)  Final    STAPHYLOCOCCUS AUREUS SUSCEPTIBILITIES PERFORMED ON PREVIOUS CULTURE WITHIN THE LAST 5 DAYS. Performed at [facility], [place] [address] [place], [place] [address]    Report Status 12/04/2020 FINAL  Final  Culture, blood (Routine X 2) w Reflex to ID Panel     Status: Abnormal   Collection Time: 12/01/20 12:55 PM   Specimen: BLOOD  Result Value Ref Range Status   Specimen Description   Final    BLOOD DRAWN BY DIALYSIS DRAWN BY RN Performed at [facility], [address], [place], [place] [address]    Special Requests   Final    BOTTLES DRAWN AEROBIC AND ANAEROBIC Blood Culture adequate  volume Performed at [facility], [address], [place], [place] [address]    Culture  Setup Time   Final    GRAM POSITIVE COCCI ANAEROBIC BOTTLE ONLY Gram Stain Report Called to,Read Back By and Verified With: HUMPHRIES @ 1600 ON 102222 BY HENDERSON L CRITICAL VALUE NOTED.  VALUE IS CONSISTENT WITH PREVIOUSLY REPORTED AND CALLED VALUE.    Culture (A)  Final    STAPHYLOCOCCUS AUREUS SUSCEPTIBILITIES PERFORMED ON PREVIOUS CULTURE WITHIN THE LAST 5 DAYS. Performed at [facility], [place] [address], [place], [place] [address]    Report Status 12/04/2020 FINAL  Final  Culture, blood (routine x 2)     Status: Abnormal   Collection Time: 12/03/20  1:03 PM   Specimen: BLOOD RIGHT HAND  Result Value Ref Range Status   Specimen Description   Final    BLOOD RIGHT HAND BOTTLES DRAWN AEROBIC AND ANAEROBIC Performed at [facility], [address], [place], [place] [address]    Special Requests   Final    Blood Culture adequate volume Performed at [facility], [address] [place], [place] [address]    Culture  Setup Time   Final    GRAM NEGATIVE RODS IN BOTH AEROBIC AND ANAEROBIC BOTTLES Gram Stain Report Called to,Read Back By and Verified With: Edyn Qazi,b@0520  by matthews, b 10.24.22 CRITICAL VALUE NOTED.  VALUE IS CONSISTENT WITH PREVIOUSLY REPORTED AND CALLED VALUE. Performed at [facility], [address], [place], [place] [address]    Culture (A)  Final    ESCHERICHIA COLI SUSCEPTIBILITIES PERFORMED ON PREVIOUS CULTURE WITHIN THE LAST 5 DAYS. Performed at [facility], [place] [address] [place], [place] [address]    Report Status 12/05/2020 FINAL  Final  Culture, blood (routine x 2)     Status: Abnormal   Collection Time: 12/03/20  1:09 PM   Specimen: Right Antecubital; Blood  Result Value Ref Range Status   Specimen Description   Final    RIGHT ANTECUBITAL BOTTLES DRAWN AEROBIC AND ANAEROBIC Performed at [facility], [address], [place], [place] [address]     Special Requests   Final    Blood Culture adequate volume Performed  at [facility], [address], [place], [place] [address]    Culture  Setup Time   Final    GRAM POSITIVE COCCI ANAEROBIC BOTTLE ONLY GRAM NEGATIVE RODS AEROBIC BOTTLE ONLY Gram Stain Report Called to,Read Back By and Verified With: BOTH ORGANISMS, MORRIS,C AT 0940 ON 10.24.22 BY RUCINSKI,B CRITICAL VALUE NOTED.  VALUE IS CONSISTENT WITH PREVIOUSLY REPORTED AND CALLED VALUE.    Culture (A)  Final    STAPHYLOCOCCUS AUREUS ESCHERICHIA COLI SUSCEPTIBILITIES PERFORMED ON PREVIOUS CULTURE WITHIN THE LAST 5 DAYS. Performed at [facility], [place] [address] [place], [place] [address]    Report Status 12/06/2020 FINAL  Final  Culture, blood (Routine X 2) w Reflex to ID Panel     Status: None (Preliminary result)   Collection Time: 12/05/20  5:29 AM   Specimen: BLOOD RIGHT HAND  Result Value Ref Range Status   Specimen Description BLOOD RIGHT HAND  Final   Special Requests   Final    BOTTLES DRAWN AEROBIC ONLY Blood Culture results may not be optimal due to an inadequate volume of blood received in culture bottles   Culture   Final    NO GROWTH 3 DAYS Performed at [facility], [address] [place], [place] [address]    Report Status PENDING  Incomplete  Culture, blood (Routine X 2) w Reflex to ID Panel     Status: None (Preliminary result)   Collection Time: 12/05/20  5:29 AM   Specimen: BLOOD RIGHT ARM  Result Value Ref Range Status   Specimen Description BLOOD RIGHT ARM  Final   Special Requests   Final    BOTTLES DRAWN AEROBIC AND ANAEROBIC Blood Culture results may not be optimal due to an excessive volume of blood received in culture bottles   Culture   Final    NO GROWTH 3 DAYS Performed at [facility], [address] [place], [place] [address]    Report Status PENDING  Incomplete    Time coordinating discharge: 65 mins   SIGNED:  Irwin Brakeman, MD  [facility] 11/29/2020, 1:05  PM How to contact the [facility] Attending or Consulting provider [place] or covering provider during after hours [place], for this patient?  Check the care team in [facility] and look for a) attending/consulting TR[facility] provider listed and b) the [facility] team listed Log into [email or web address] and use [place]'s universal password to access. If you do not have the password, please contact the hospital operator. Locate the [facility] provider you are looking for under [facility] and page to a number that you can be directly reached. If you still have difficulty reaching the provider, please page the [facility] (Director on Call) for the Hospitalists listed on amion for assistance.

## 2020-12-08 NOTE — TOC Transition Note (Addendum)
Transition of Care [facility]) - CM/SW Discharge Note   Patient Details  Name: Travis Blair MRN: [id number] Date of Birth: [birth year]  [facility] CM/SW Contact:  Natasha Bence, LCSW Phone Number: 12/09/2020, 1:02 PM   Clinical Narrative:    CSW notified of patient's readiness for discharge. Kerri with [facility] agreeable to take patient. CSW faxed discharge summary. Auth received. Nurse to call report. [facility] to transport patient. TOC signing off.   Final next level of care: Skilled Nursing Facility Barriers to Discharge: Barriers Resolved   Patient Goals and CMS Choice Patient states their goals for this hospitalization and ongoing recovery are:: rehab with SNF CMS Medicare.gov Compare Post Acute Care list provided to:: Patient Choice offered to / list presented to : Patient  Discharge Placement              Patient chooses bed at: [facility] Patient to be transferred to facility by: [facility] Name of family member notified: Corben, Auzenne (Spouse)   [phone] Patient and family notified of of transfer: 11/13/2020  Discharge Plan and Services In-house Referral: Clinical Social Work   Post Acute Care Choice: Home Health                               Social Determinants of Health (SDOH) Interventions     Readmission Risk Interventions Readmission Risk Prevention Plan 11/28/2020 11/28/2020 09/08/2020  Transportation Screening - Complete Complete  PCP or Specialist Appt within 5-7 Days - - Complete  Home Care Screening - - Complete  Medication Review (RN CM) - - Complete  HRI or [place] - Complete -  Social Work Consult for [place] Planning/Counseling Complete - -  Palliative Care Screening - Not Applicable -  Medication Review (RN Care Manager) - Complete -  Some recent data might be hidden

## 2020-12-08 NOTE — Progress Notes (Signed)
[facility] ROUNDING NOTE   Subjective:   Interval History: End-stage renal disease Monday Wednesday Friday dialysis.  Scheduled for dialysis 12/03/2020.  Patient was admitted with acute hypoxic respiratory failure volume overload and diastolic dysfunction.  He underwent dialysis on 12/06/2020 with 2.6 L removed.  Blood pressure 138/66 pulse 83 temperature 97.8 O2 sats 93% nasal cannula  Hemoglobin 9.4    sodium 127 potassium 4.1 chloride 91 CO2 25 BUN 86 creatinine 7.4 glucose 244 calcium 8.3   Objective:  Vital signs in last 24 hours:  Temp:  [97.6 F (36.4 C)-97.8 F (36.6 C)] 97.8 F (36.6 C) (10/28 0531) Pulse Rate:  [66-83] 83 (10/28 0531) Resp:  [17-18] 17 (10/28 0531) BP: (124-138)/(52-71) 138/66 (10/28 0531) SpO2:  [91 %-93 %] 93 % (10/28 0531)  Weight change:  Filed Weights   12/04/20 0600 12/04/20 0850 12/06/20 1350  Weight: 114.6 kg 114.6 kg 116.6 kg    Intake/Output: I/O last 3 completed shifts: In: 1069.2 [P.O.:480; I.V.:239.2; IV Piggyback:350] Out: 200 [Urine:200]   Intake/Output this shift:  No intake/output data recorded.  CVS- RRR RS- CTA ABD- BS present soft non-distended EXT- no edema   Basic Metabolic Panel: Recent Labs  Lab 12/04/20 0341 12/05/20 0757 12/06/20 1406  NA 129* 129* 127*  K 4.2 4.0 4.1  CL 91* 92* 91*  CO2 23 26 25   GLUCOSE 283* 237* 244*  BUN 97* 71* 86*  CREATININE 7.55* 6.28* 7.38*  CALCIUM 8.1* 8.0* 8.3*  PHOS  --   --  5.7*    Liver Function Tests: Recent Labs  Lab 12/02/20 0435 12/04/20 0341 12/05/20 0757 12/06/20 1406  AST 347* 138* 88*  --   ALT 130* 180* 141*  --   ALKPHOS 174* 194* 184*  --   BILITOT 1.6* 0.8 0.8  --   PROT 6.9 6.0* 6.5  --   ALBUMIN 3.1* 2.6* 2.6* 2.4*   No results for input(s): LIPASE, AMYLASE in the last 168 hours. No results for input(s): AMMONIA in the last 168 hours.  CBC: Recent Labs  Lab 12/04/20 0341 12/05/20 0757 12/06/20 0558 12/07/20 0518  11/15/2020 0436  WBC 13.9* 16.0* 15.6* 16.2* 13.9*  HGB 9.7* 10.5* 9.4* 9.7* 9.4*  HCT 31.5* 34.0* 31.0* 32.6* 30.3*  MCV 89.0 89.5 90.1 89.6 89.4  PLT 214 226 255 238 253    Cardiac Enzymes: No results for input(s): CKTOTAL, CKMB, CKMBINDEX, TROPONINI in the last 168 hours.  BNP: Invalid input(s): POCBNP  CBG: Recent Labs  Lab 12/07/20 1101 12/07/20 1606 12/07/20 2139 12/05/2020 0254 12/09/2020 0745  GLUCAP 219* 260* 143* 229* 156*    Microbiology: Results for orders placed or performed during the hospital encounter of 11/27/20  Resp Panel by RT-PCR (Flu A&B, Covid) Nasopharyngeal Swab     Status: None   Collection Time: 11/27/20 10:09 AM   Specimen: Nasopharyngeal Swab; Nasopharyngeal(NP) swabs in vial transport medium  Result Value Ref Range Status   SARS Coronavirus 2 by RT PCR NEGATIVE NEGATIVE Final    Comment: (NOTE) SARS-CoV-2 target nucleic acids are NOT DETECTED.  The SARS-CoV-2 RNA is generally detectable in upper respiratory specimens during the acute phase of infection. The lowest concentration of SARS-CoV-2 viral copies this assay can detect is 138 copies/mL. A negative result does not preclude SARS-Cov-2 infection and should not be used as the sole basis for treatment or other patient management decisions. A negative result may occur with  improper specimen collection/handling, submission of specimen other than nasopharyngeal swab, presence  of viral mutation(s) within the areas targeted by this assay, and inadequate number of viral copies(<138 copies/mL). A negative result must be combined with clinical observations, patient history, and epidemiological information. The expected result is Negative.  Fact Sheet for Patients:  EntrepreneurPulse.com.au  Fact Sheet for Healthcare Providers:  IncredibleEmployment.be  This test is no t yet approved or cleared by the [place] FDA and  has been authorized for detection  and/or diagnosis of SARS-CoV-2 by FDA under an Emergency Use Authorization (EUA). This EUA will remain  in effect (meaning this test can be used) for the duration of the COVID-19 declaration under Section 564(b)(1) of the Act, 21 U.S.C.section 360bbb-3(b)(1), unless the authorization is terminated  or revoked sooner.       Influenza A by PCR NEGATIVE NEGATIVE Final   Influenza B by PCR NEGATIVE NEGATIVE Final    Comment: (NOTE) The Xpert Xpress SARS-CoV-2/FLU/RSV plus assay is intended as an aid in the diagnosis of influenza from Nasopharyngeal swab specimens and should not be used as a sole basis for treatment. Nasal washings and aspirates are unacceptable for Xpert Xpress SARS-CoV-2/FLU/RSV testing.  Fact Sheet for Patients: EntrepreneurPulse.com.au  Fact Sheet for Healthcare Providers: IncredibleEmployment.be  This test is not yet approved or cleared by the [place] FDA and has been authorized for detection and/or diagnosis of SARS-CoV-2 by FDA under an Emergency Use Authorization (EUA). This EUA will remain in effect (meaning this test can be used) for the duration of the COVID-19 declaration under Section 564(b)(1) of the Act, 21 U.S.C. section 360bbb-3(b)(1), unless the authorization is terminated or revoked.  Performed at [facility], [address], [place], [place] [address]   MRSA Next Gen by PCR, Nasal     Status: Abnormal   Collection Time: 11/27/20  4:10 PM   Specimen: Nasal Mucosa; Nasal Swab  Result Value Ref Range Status   MRSA by PCR Next Gen DETECTED (A) NOT DETECTED Final    Comment: RESULT CALLED TO, READ BACK BY AND VERIFIED WITH: HEATHER TETREAULT,1951,11/27/2020,SELF S. (NOTE) The GeneXpert MRSA Assay (FDA approved for NASAL specimens only), is one component of a comprehensive MRSA colonization surveillance program. It is not intended to diagnose MRSA infection nor to guide or monitor treatment for MRSA  infections. Test performance is not FDA approved in patients less than 74 years old. Performed at [facility], [address] [place], [place] [address]   Culture, blood (Routine X 2) w Reflex to ID Panel     Status: Abnormal   Collection Time: 11/28/20  9:34 AM   Specimen: BLOOD RIGHT HAND  Result Value Ref Range Status   Specimen Description   Final    BLOOD RIGHT HAND Performed at [facility], [address] [place], [place] [address]    Special Requests   Final    Immunocompromised Blood Culture adequate volume BOTTLES DRAWN AEROBIC AND ANAEROBIC Performed at [facility], [address], [place], [place] [address]    Culture  Setup Time   Final    GRAM POSITIVE COCCI IN BOTH AEROBIC AND ANAEROBIC BOTTLES Gram Stain Report Called to,Read Back By and Verified WithLynwood Dawley, 2216, 11/28/2020,SELF S aerobic bottle previously called 10.18.22 2353 matthews, b  Performed at [facility], [address] [place], [place] [address]    Culture (A)  Final    STAPHYLOCOCCUS AUREUS SUSCEPTIBILITIES PERFORMED ON PREVIOUS CULTURE WITHIN THE LAST 5 DAYS. Performed at [facility], [place] [address] [place], [place] [address]  Report Status 12/02/2020 FINAL  Final  Culture, blood (Routine X 2) w Reflex to ID Panel     Status: Abnormal   Collection Time: 11/28/20  9:34 AM   Specimen: BLOOD RIGHT FOREARM  Result Value Ref Range Status   Specimen Description   Final    BLOOD RIGHT FOREARM Performed at [facility], [address], [place], [place] [address]    Special Requests   Final    Immunocompromised Blood Culture adequate volume BOTTLES DRAWN AEROBIC AND ANAEROBIC Performed at [facility], [address], [place], [place] [address]    Culture  Setup Time   Final    GRAM POSITIVE COCCI IN BOTH AEROBIC AND ANAEROBIC BOTTLES Gram Stain Report Called to,Read Back By and Verified With: T.ALLEN,2236,11/28/2020,SELF S aerobic bottle gpc previously called matthews, b 10.18.22  2355 CRITICAL RESULT CALLED TO, READ BACK BY AND VERIFIED WITH: T ALLEN,RN@0209  11/29/20 [place]    Culture (A)  Final    METHICILLIN RESISTANT STAPHYLOCOCCUS AUREUS VIRIDANS STREPTOCOCCUS THE SIGNIFICANCE OF ISOLATING THIS ORGANISM FROM A SINGLE SET OF BLOOD CULTURES WHEN MULTIPLE SETS ARE DRAWN IS UNCERTAIN. PLEASE NOTIFY THE MICROBIOLOGY DEPARTMENT WITHIN ONE WEEK IF SPECIATION AND SENSITIVITIES ARE REQUIRED. Performed at [facility], [place] [address], [place], [place] [address]    Report Status 12/02/2020 FINAL  Final   Organism ID, Bacteria METHICILLIN RESISTANT STAPHYLOCOCCUS AUREUS  Final      Susceptibility   Methicillin resistant staphylococcus aureus - MIC*    CIPROFLOXACIN >=8 RESISTANT Resistant     ERYTHROMYCIN >=8 RESISTANT Resistant     GENTAMICIN <=0.5 SENSITIVE Sensitive     OXACILLIN >=4 RESISTANT Resistant     TETRACYCLINE <=1 SENSITIVE Sensitive     VANCOMYCIN 1 SENSITIVE Sensitive     TRIMETH/SULFA <=10 SENSITIVE Sensitive     CLINDAMYCIN >=8 RESISTANT Resistant     RIFAMPIN <=0.5 SENSITIVE Sensitive     Inducible Clindamycin NEGATIVE Sensitive     * METHICILLIN RESISTANT STAPHYLOCOCCUS AUREUS  Blood Culture ID Panel (Reflexed)     Status: Abnormal   Collection Time: 11/28/20  9:34 AM  Result Value Ref Range Status   Enterococcus faecalis NOT DETECTED NOT DETECTED Final   Enterococcus Faecium NOT DETECTED NOT DETECTED Final   Listeria monocytogenes NOT DETECTED NOT DETECTED Final   Staphylococcus species DETECTED (A) NOT DETECTED Final    Comment: CRITICAL RESULT CALLED TO, READ BACK BY AND VERIFIED WITH: T ALLEN,RN@0209  11/29/20 [place]    Staphylococcus aureus (BCID) DETECTED (A) NOT DETECTED Final    Comment: Methicillin (oxacillin)-resistant Staphylococcus aureus (MRSA). MRSA is predictably resistant to beta-lactam antibiotics (except ceftaroline). Preferred therapy is vancomycin unless clinically contraindicated. Patient requires contact precautions if   hospitalized. CRITICAL RESULT CALLED TO, READ BACK BY AND VERIFIED WITH: T ALLEN,RN@0209  11/29/20 [place]    Staphylococcus epidermidis NOT DETECTED NOT DETECTED Final   Staphylococcus lugdunensis NOT DETECTED NOT DETECTED Final   Streptococcus species DETECTED (A) NOT DETECTED Final    Comment: Not Enterococcus species, Streptococcus agalactiae, Streptococcus pyogenes, or Streptococcus pneumoniae. CRITICAL RESULT CALLED TO, READ BACK BY AND VERIFIED WITH: T ALLEN,RN@ 0209 11/29/20 [place]    Streptococcus agalactiae NOT DETECTED NOT DETECTED Final   Streptococcus pneumoniae NOT DETECTED NOT DETECTED Final   Streptococcus pyogenes NOT DETECTED NOT DETECTED Final   A.calcoaceticus-baumannii NOT DETECTED NOT DETECTED Final   Bacteroides fragilis NOT DETECTED NOT DETECTED Final   Enterobacterales NOT DETECTED NOT DETECTED Final   Enterobacter cloacae complex NOT DETECTED NOT DETECTED Final  Escherichia coli NOT DETECTED NOT DETECTED Final   Klebsiella aerogenes NOT DETECTED NOT DETECTED Final   Klebsiella oxytoca NOT DETECTED NOT DETECTED Final   Klebsiella pneumoniae NOT DETECTED NOT DETECTED Final   Proteus species NOT DETECTED NOT DETECTED Final   Salmonella species NOT DETECTED NOT DETECTED Final   Serratia marcescens NOT DETECTED NOT DETECTED Final   Haemophilus influenzae NOT DETECTED NOT DETECTED Final   Neisseria meningitidis NOT DETECTED NOT DETECTED Final   Pseudomonas aeruginosa NOT DETECTED NOT DETECTED Final   Stenotrophomonas maltophilia NOT DETECTED NOT DETECTED Final   Candida albicans NOT DETECTED NOT DETECTED Final   Candida auris NOT DETECTED NOT DETECTED Final   Candida glabrata NOT DETECTED NOT DETECTED Final   Candida krusei NOT DETECTED NOT DETECTED Final   Candida parapsilosis NOT DETECTED NOT DETECTED Final   Candida tropicalis NOT DETECTED NOT DETECTED Final   Cryptococcus neoformans/gattii NOT DETECTED NOT DETECTED Final   Meth resistant mecA/C and MREJ DETECTED  (A) NOT DETECTED Final    Comment: CRITICAL RESULT CALLED TO, READ BACK BY AND VERIFIED WITH: T ALLEN,RN@0209  11/29/20 [place] Performed at [facility], [address], [place], [place] [address]   Culture, blood (Routine X 2) w Reflex to ID Panel     Status: Abnormal   Collection Time: 11/30/20  4:53 AM   Specimen: BLOOD RIGHT HAND  Result Value Ref Range Status   Specimen Description   Final    BLOOD RIGHT HAND Performed at [facility], [address], [place], [place] [address]    Special Requests   Final    BOTTLES DRAWN AEROBIC AND ANAEROBIC Blood Culture adequate volume Performed at [facility], [address] [place], [place] [address]    Culture  Setup Time   Final    GRAM NEGATIVE RODS AEROBIC BOTTLE ONLY Gram Stain Report Called to,Read Back By and Verified With: EVANS T.,2003,11/30/2020,SELF S CRITICAL RESULT CALLED TO, READ BACK BY AND VERIFIED WITH: S,HURTH PHARMD @1216  12/01/20 EB Performed at [facility], [place] [address], [place], [place] [address]    Culture ESCHERICHIA COLI (A)  Final   Report Status 12/02/2020 FINAL  Final   Organism ID, Bacteria ESCHERICHIA COLI  Final      Susceptibility   Escherichia coli - MIC*    AMPICILLIN >=32 RESISTANT Resistant     CEFAZOLIN <=4 SENSITIVE Sensitive     CEFEPIME <=0.12 SENSITIVE Sensitive     CEFTAZIDIME <=1 SENSITIVE Sensitive     CEFTRIAXONE <=0.25 SENSITIVE Sensitive     CIPROFLOXACIN <=0.25 SENSITIVE Sensitive     GENTAMICIN >=16 RESISTANT Resistant     IMIPENEM <=0.25 SENSITIVE Sensitive     TRIMETH/SULFA >=320 RESISTANT Resistant     AMPICILLIN/SULBACTAM >=32 RESISTANT Resistant     PIP/TAZO <=4 SENSITIVE Sensitive     * ESCHERICHIA COLI  Culture, blood (Routine X 2) w Reflex to ID Panel     Status: None   Collection Time: 11/30/20  4:53 AM   Specimen: BLOOD RIGHT ARM  Result Value Ref Range Status   Specimen Description BLOOD RIGHT ARM  Final   Special Requests   Final    BOTTLES DRAWN AEROBIC  AND ANAEROBIC Blood Culture results may not be optimal due to an excessive volume of blood received in culture bottles   Culture   Final    NO GROWTH 5 DAYS Performed at [facility], [address], [place], [place] [address]    Report Status 12/05/2020 FINAL  Final  Blood Culture ID Panel (Reflexed)     Status: Abnormal   Collection Time: 11/30/20  4:53 AM  Result Value Ref Range Status   Enterococcus faecalis NOT DETECTED NOT DETECTED Final   Enterococcus Faecium NOT DETECTED NOT DETECTED Final   Listeria monocytogenes NOT DETECTED NOT DETECTED Final   Staphylococcus species NOT DETECTED NOT DETECTED Final   Staphylococcus aureus (BCID) NOT DETECTED NOT DETECTED Final   Staphylococcus epidermidis NOT DETECTED NOT DETECTED Final   Staphylococcus lugdunensis NOT DETECTED NOT DETECTED Final   Streptococcus species NOT DETECTED NOT DETECTED Final   Streptococcus agalactiae NOT DETECTED NOT DETECTED Final   Streptococcus pneumoniae NOT DETECTED NOT DETECTED Final   Streptococcus pyogenes NOT DETECTED NOT DETECTED Final   A.calcoaceticus-baumannii NOT DETECTED NOT DETECTED Final   Bacteroides fragilis NOT DETECTED NOT DETECTED Final   Enterobacterales DETECTED (A) NOT DETECTED Final    Comment: Enterobacterales represent a large order of gram negative bacteria, not a single organism. CRITICAL RESULT CALLED TO, READ BACK BY AND VERIFIED WITH: S,HURTH PHARMD @1216  12/01/20 [place]    Enterobacter cloacae complex NOT DETECTED NOT DETECTED Final   Escherichia coli DETECTED (A) NOT DETECTED Final    Comment: CRITICAL RESULT CALLED TO, READ BACK BY AND VERIFIED WITH: S,HURTH PHARMD @1216  12/01/20 EB    Klebsiella aerogenes NOT DETECTED NOT DETECTED Final   Klebsiella oxytoca NOT DETECTED NOT DETECTED Final   Klebsiella pneumoniae NOT DETECTED NOT DETECTED Final   Proteus species NOT DETECTED NOT DETECTED Final   Salmonella species NOT DETECTED NOT DETECTED Final   Serratia marcescens NOT  DETECTED NOT DETECTED Final   Haemophilus influenzae NOT DETECTED NOT DETECTED Final   Neisseria meningitidis NOT DETECTED NOT DETECTED Final   Pseudomonas aeruginosa NOT DETECTED NOT DETECTED Final   Stenotrophomonas maltophilia NOT DETECTED NOT DETECTED Final   Candida albicans NOT DETECTED NOT DETECTED Final   Candida auris NOT DETECTED NOT DETECTED Final   Candida glabrata NOT DETECTED NOT DETECTED Final   Candida krusei NOT DETECTED NOT DETECTED Final   Candida parapsilosis NOT DETECTED NOT DETECTED Final   Candida tropicalis NOT DETECTED NOT DETECTED Final   Cryptococcus neoformans/gattii NOT DETECTED NOT DETECTED Final   CTX-M ESBL NOT DETECTED NOT DETECTED Final   Carbapenem resistance IMP NOT DETECTED NOT DETECTED Final   Carbapenem resistance KPC NOT DETECTED NOT DETECTED Final   Carbapenem resistance NDM NOT DETECTED NOT DETECTED Final   Carbapenem resist OXA 48 LIKE NOT DETECTED NOT DETECTED Final   Carbapenem resistance VIM NOT DETECTED NOT DETECTED Final    Comment: Performed at [facility], [address], [place], [place] [address]  Culture, blood (Routine X 2) w Reflex to ID Panel     Status: Abnormal   Collection Time: 12/01/20 12:46 PM   Specimen: BLOOD  Result Value Ref Range Status   Specimen Description   Final    BLOOD DRAWN BY DIALYSIS DRAWN BY RN Performed at [facility], [address] [place], [place] [address]    Special Requests   Final    BOTTLES DRAWN AEROBIC AND ANAEROBIC Blood Culture adequate volume Performed at [facility], [address], [place], [place] [address]    Culture  Setup Time   Final    GRAM POSITIVE COCCI both anaerobic and aerobic bottles Gram Stain Report Called to,Read Back By and Verified With: Rhew,J@0545  by Matthews,B 10.22.22 Performed at [facility], [address] [place],  [address]    Culture (A)  Final    STAPHYLOCOCCUS AUREUS SUSCEPTIBILITIES PERFORMED ON PREVIOUS CULTURE WITHIN THE LAST 5  DAYS. Performed at [facility], [place] [address] [place], [place] [address]    Report Status 12/04/2020 FINAL  Final  Culture, blood (Routine X 2) w Reflex to ID Panel     Status: Abnormal   Collection Time: 12/01/20 12:55 PM   Specimen: BLOOD  Result Value Ref Range Status   Specimen Description   Final    BLOOD DRAWN BY DIALYSIS DRAWN BY RN Performed at [facility], [address] [place], [place] [address]    Special Requests   Final    BOTTLES DRAWN AEROBIC AND ANAEROBIC Blood Culture adequate volume Performed at [facility], [address], [place], [place] [address]    Culture  Setup Time   Final    GRAM POSITIVE COCCI ANAEROBIC BOTTLE ONLY Gram Stain Report Called to,Read Back By and Verified With: HUMPHRIES @ 1600 ON 102222 BY HENDERSON L CRITICAL VALUE NOTED.  VALUE IS CONSISTENT WITH PREVIOUSLY REPORTED AND CALLED VALUE.    Culture (A)  Final    STAPHYLOCOCCUS AUREUS SUSCEPTIBILITIES PERFORMED ON PREVIOUS CULTURE WITHIN THE LAST 5 DAYS. Performed at [facility], [place] [address], [place], [place] [address]    Report Status 12/04/2020 FINAL  Final  Culture, blood (routine x 2)     Status: Abnormal   Collection Time: 12/03/20  1:03 PM   Specimen: BLOOD RIGHT HAND  Result Value Ref Range Status   Specimen Description   Final    BLOOD RIGHT HAND BOTTLES DRAWN AEROBIC AND ANAEROBIC Performed at [facility], [address], [place], [place] [address]    Special Requests   Final    Blood Culture adequate volume Performed at [facility], [address], [place], [place] [address]    Culture  Setup Time   Final    GRAM NEGATIVE RODS IN BOTH AEROBIC AND ANAEROBIC BOTTLES Gram Stain Report Called to,Read Back By and Verified With: johnson,b@0520  by matthews, b 10.24.22 CRITICAL VALUE NOTED.  VALUE IS CONSISTENT WITH PREVIOUSLY REPORTED AND CALLED VALUE. Performed at [facility], [address], [place], [place] [address]    Culture (A)  Final    ESCHERICHIA  COLI SUSCEPTIBILITIES PERFORMED ON PREVIOUS CULTURE WITHIN THE LAST 5 DAYS. Performed at [facility], [place] [address], [place], [place] [address]    Report Status 12/05/2020 FINAL  Final  Culture, blood (routine x 2)     Status: Abnormal   Collection Time: 12/03/20  1:09 PM   Specimen: Right Antecubital; Blood  Result Value Ref Range Status   Specimen Description   Final    RIGHT ANTECUBITAL BOTTLES DRAWN AEROBIC AND ANAEROBIC Performed at [facility], [address], [place], [place] [address]    Special Requests   Final    Blood Culture adequate volume Performed at [facility], [address], [place], [place] [address]    Culture  Setup Time   Final    GRAM POSITIVE COCCI ANAEROBIC BOTTLE ONLY GRAM NEGATIVE RODS AEROBIC BOTTLE ONLY Gram Stain Report Called to,Read Back By and Verified With: BOTH ORGANISMS, MORRIS,C AT 0940 ON 10.24.22 BY RUCINSKI,B CRITICAL VALUE NOTED.  VALUE IS CONSISTENT WITH PREVIOUSLY REPORTED AND CALLED VALUE.    Culture (A)  Final    STAPHYLOCOCCUS AUREUS ESCHERICHIA COLI SUSCEPTIBILITIES PERFORMED ON PREVIOUS CULTURE WITHIN THE LAST 5 DAYS. Performed at [facility], [place] [address], [place],  [address]  Report Status 12/06/2020 FINAL  Final  Culture, blood (Routine X 2) w Reflex to ID Panel     Status: None (Preliminary result)   Collection Time: 12/05/20  5:29 AM   Specimen: BLOOD RIGHT HAND  Result Value Ref Range Status   Specimen Description BLOOD RIGHT HAND  Final   Special Requests   Final    BOTTLES DRAWN AEROBIC ONLY Blood Culture results may not be optimal due to an inadequate volume of blood received in culture bottles   Culture   Final    NO GROWTH 3 DAYS Performed at [facility], [address], [place], [place] [address]    Report Status PENDING  Incomplete  Culture, blood (Routine X 2) w Reflex to ID Panel     Status: None (Preliminary result)   Collection Time: 12/05/20  5:29 AM   Specimen: BLOOD RIGHT ARM   Result Value Ref Range Status   Specimen Description BLOOD RIGHT ARM  Final   Special Requests   Final    BOTTLES DRAWN AEROBIC AND ANAEROBIC Blood Culture results may not be optimal due to an excessive volume of blood received in culture bottles   Culture   Final    NO GROWTH 3 DAYS Performed at [facility], [address], [place], [place] [address]    Report Status PENDING  Incomplete    Coagulation Studies: No results for input(s): LABPROT, INR in the last 72 hours.  Urinalysis: No results for input(s): COLORURINE, LABSPEC, PHURINE, GLUCOSEU, HGBUR, BILIRUBINUR, KETONESUR, PROTEINUR, UROBILINOGEN, NITRITE, LEUKOCYTESUR in the last 72 hours.  Invalid input(s): APPERANCEUR    Imaging: No results found.   Medications:     ceFAZolin (ANCEF) IV 2 g (12/06/20 1730)   vancomycin 1,250 mg (12/06/20 1640)    apixaban  5 mg Oral BID   calcitRIOL  0.5 mcg Oral Q M,W,F   calcium acetate  1,334 mg Oral TID with meals   Chlorhexidine Gluconate Cloth  6 each Topical Q0600   Chlorhexidine Gluconate Cloth  6 each Topical Q0600   insulin aspart  0-6 Units Subcutaneous TID WC   insulin aspart  2 Units Subcutaneous TID WC   insulin glargine-yfgn  10 Units Subcutaneous Q2200   loratadine  10 mg Oral q1800   mouth rinse  15 mL Mouth Rinse BID   mupirocin ointment   Nasal BID   pantoprazole  40 mg Oral Daily   silver sulfADIAZINE   Topical BID   acetaminophen **OR** acetaminophen, albuterol, fluticasone, heparin, ondansetron **OR** ondansetron (ZOFRAN) IV  Assessment/ Plan:  Dialysis Orders: Center: [facility]  on MWF . EDW 120.5kg HD [place] 3K/2.5Ca  Time 4 hours Heparin 2000 units bolus. Access RIJ TDC (maturing LUE AVF) BFR 400 DFR 800    Calcitriol 0.5 mcg po/HD Venofer  100 mg TIW, Micera 100 mcg IVP every 2 weeks    Assessment/Plan:  Acute hypoxic respiratory failure - presumably due to volume overload and acute diastolic CHF.  Improved with ongoing HD and UF.   Will continue to  challenge edw (already 6 kg below edw).  ESRD -  continue with HD MWF while he remains an inpatient. Next dialysis 10/28  Hypertension/volume  - as above, will continue to challenge edw.  Anemia  - stable  Metabolic bone disease -  continue with home meds  Nutrition -  renal diet, carb modified.  Chronic atrial fibrillation - rate controlled History of DVT/PE - to resume apixaban. ID  - had fever with + blood  cultures, initially for MRSA and now E. Coli (12/03/20).  TDC removed 11/29/20 and on IV cefepime and Vancomycin.  Continue to follow. TEE 12/05/20 negative for vegetations and MRI of LS spine negative for osteo but MRI of foot suspicious for early osteo of fifth metatarsal head. New blood cultures drawn today. Per ID for IV vanc and fortaz for 6 weeks for now. Vascular access - TDC removed 10/19 and using LUE AVF but has high venous pressures.  Will hold off on new [facility] for now and continue to use AVF. Deconditioning - pt is very weak and unable to stand.  Will likely need PT/OT and SNF placement for rehab.     LOS: [place] @TODAY @8 :09 AM

## 2020-12-09 DIAGNOSIS — Z992 Dependence on renal dialysis: Secondary | ICD-10-CM | POA: Diagnosis not present

## 2020-12-09 DIAGNOSIS — I129 Hypertensive chronic kidney disease with stage 1 through stage 4 chronic kidney disease, or unspecified chronic kidney disease: Secondary | ICD-10-CM | POA: Diagnosis not present

## 2020-12-09 DIAGNOSIS — N186 End stage renal disease: Secondary | ICD-10-CM | POA: Diagnosis not present

## 2020-12-10 LAB — CULTURE, BLOOD (ROUTINE X 2)
Culture: NO GROWTH
Culture: NO GROWTH

## 2020-12-11 ENCOUNTER — Non-Acute Institutional Stay (SKILLED_NURSING_FACILITY): Payer: PPO | Admitting: Adult Health

## 2020-12-11 ENCOUNTER — Encounter: Payer: Self-pay | Admitting: Adult Health

## 2020-12-11 DIAGNOSIS — D689 Coagulation defect, unspecified: Secondary | ICD-10-CM | POA: Diagnosis not present

## 2020-12-11 DIAGNOSIS — I120 Hypertensive chronic kidney disease with stage 5 chronic kidney disease or end stage renal disease: Secondary | ICD-10-CM

## 2020-12-11 DIAGNOSIS — D631 Anemia in chronic kidney disease: Secondary | ICD-10-CM

## 2020-12-11 DIAGNOSIS — Z992 Dependence on renal dialysis: Secondary | ICD-10-CM | POA: Diagnosis not present

## 2020-12-11 DIAGNOSIS — I82532 Chronic embolism and thrombosis of left popliteal vein: Secondary | ICD-10-CM | POA: Diagnosis not present

## 2020-12-11 DIAGNOSIS — A4102 Sepsis due to Methicillin resistant Staphylococcus aureus: Secondary | ICD-10-CM | POA: Diagnosis not present

## 2020-12-11 DIAGNOSIS — I5042 Chronic combined systolic (congestive) and diastolic (congestive) heart failure: Secondary | ICD-10-CM | POA: Diagnosis not present

## 2020-12-11 DIAGNOSIS — E1122 Type 2 diabetes mellitus with diabetic chronic kidney disease: Secondary | ICD-10-CM

## 2020-12-11 DIAGNOSIS — I2699 Other pulmonary embolism without acute cor pulmonale: Secondary | ICD-10-CM

## 2020-12-11 DIAGNOSIS — L97518 Non-pressure chronic ulcer of other part of right foot with other specified severity: Secondary | ICD-10-CM

## 2020-12-11 DIAGNOSIS — R7881 Bacteremia: Secondary | ICD-10-CM | POA: Diagnosis not present

## 2020-12-11 DIAGNOSIS — E039 Hypothyroidism, unspecified: Secondary | ICD-10-CM | POA: Diagnosis not present

## 2020-12-11 DIAGNOSIS — N2581 Secondary hyperparathyroidism of renal origin: Secondary | ICD-10-CM | POA: Diagnosis not present

## 2020-12-11 DIAGNOSIS — K219 Gastro-esophageal reflux disease without esophagitis: Secondary | ICD-10-CM

## 2020-12-11 DIAGNOSIS — J9601 Acute respiratory failure with hypoxia: Secondary | ICD-10-CM

## 2020-12-11 DIAGNOSIS — E11621 Type 2 diabetes mellitus with foot ulcer: Secondary | ICD-10-CM

## 2020-12-11 DIAGNOSIS — N186 End stage renal disease: Secondary | ICD-10-CM

## 2020-12-11 DIAGNOSIS — I4819 Other persistent atrial fibrillation: Secondary | ICD-10-CM

## 2020-12-11 DIAGNOSIS — E119 Type 2 diabetes mellitus without complications: Secondary | ICD-10-CM | POA: Diagnosis not present

## 2020-12-11 DIAGNOSIS — G4733 Obstructive sleep apnea (adult) (pediatric): Secondary | ICD-10-CM

## 2020-12-11 NOTE — Progress Notes (Signed)
Location:  [facility] Room Number: 151-P Place of Service:  SNF (31)   CODE STATUS: DNR  Allergies  Allergen Reactions   Latex Hives and Rash   Lipitor [Atorvastatin]     Leg aches     Chief Complaint  Patient presents with   Hospitalization Follow-up    HPI:  He is a 70 year old man who has been hospitalized from 11-27-20 through 11/21/2020. His medical history includes: esrd; diabetes type 2; dvt/pe; chf. He has been started on dialysis in March 2022; able to use fistula 12-01-20. His tunneled access was removed on 11-29-20.  He was treated for e-coli bacteremia; mrsa sepsis will need IV abt through 01-16-21. He was treated for acute respiratory failure; acute on chronic CHF. He has a right diabetic foot ulceration with probable osteomyelitis with 2.3 cm fluid collection was debrided on 12-02-20 and 12-04-20. He is here for short term rehab with his goal to return back home. He denies any uncontrolled pain; does have increased lethargy. He will continue to be followed for his chronic illnesses including:   End stage renal disease on dialysis due to type 2 diabetes/dependence on dialysis:  Type 2 diabetes mellitus with end stage renal disease: Secondary hyperparathyroidism Pulmonary embolism on right/chronic deep vein thrombosis (DVT) of left popliteal vein:  Past Medical History:  Diagnosis Date   Anemia associated with chronic renal failure basically   Asthma    as a child   CHF (congestive heart failure) (HCC)    Chronic kidney disease    Sees Dr Hyman Hopes   Constipation    Coronary artery disease    COVID-19    Depression    Diabetes mellitus without complication (HCC)    DVT (deep venous thrombosis) (HCC)    History of blood clots    Hyperlipidemia    Hypertension    Leg pain    ABIs/LE Arterial US 03/2019: ABIs unreliable; +calcification, no evidence of stenosis   Myocardial infarction [facility])    Obesity    Pneumonia 2018   Pulmonary embolism (HCC)     Sleep apnea    CPAP   SOB (shortness of breath)    Swelling of both lower extremities     Past Surgical History:  Procedure Laterality Date   A/V FISTULAGRAM Left 09/29/2020   Procedure: A/V FISTULAGRAM;  Surgeon: Chuck Hint, MD;  Location: [facility] INVASIVE CV LAB;  Service: Cardiovascular;  Laterality: Left;   AV FISTULA PLACEMENT Left 05/04/2020   Procedure: LEFT ARM ARTERIOVENOUS (AV) FISTULA CREATION;  Surgeon: Larina Earthly, MD;  Location: [facility] ORS;  Service: Vascular;  Laterality: Left;   COLONOSCOPY     CORONARY ARTERY BYPASS GRAFT N/A 03/07/2017   Procedure: CORONARY ARTERY BYPASS GRAFTING (CABG), X 3 , USING RIGHT INTERNAL MAMMARY ARTERY, AND RIGHT LEG GREATER SAPHENOUS VEIN HARVESTED ENDOSCOPICALLY;  Surgeon: Kerin Perna, MD;  Location: [facility] OR;  Service: Open Heart Surgery;  Laterality: N/A;   ESOPHAGEAL DILATION  12/02/2020   Procedure: ESOPHAGEAL DILATION;  Surgeon: Malissa Hippo, MD;  Location: [facility] ENDO SUITE;  Service: Endoscopy;;   ESOPHAGOGASTRODUODENOSCOPY (EGD) WITH PROPOFOL N/A 12/02/2020   Procedure: ESOPHAGOGASTRODUODENOSCOPY (EGD) WITH PROPOFOL;  Surgeon: Malissa Hippo, MD;  Location: [facility] ENDO SUITE;  Service: Endoscopy;  Laterality: N/A;   FISTULA SUPERFICIALIZATION Left 10/09/2020   Procedure: FISTULA SUPERFICIALIZATION;  Surgeon: Chuck Hint, MD;  Location: [facility] OR;  Service: Vascular;  Laterality: Left;   HAND SURGERY Right 1991  IR FLUORO GUIDE CV LINE RIGHT  09/04/2020   IR US GUIDE VASC ACCESS RIGHT  09/04/2020   LIGATION OF COMPETING BRANCHES OF ARTERIOVENOUS FISTULA Left 10/09/2020   Procedure: LIGATION OF 2 LARGE COMPETING BRANCHES OF LEFT BRACHIOCEPHALIC FISTULA;  Surgeon: Chuck Hint, MD;  Location: [facility] OR;  Service: Vascular;  Laterality: Left;   RIGHT HEART CATH N/A 07/07/2020   Procedure: RIGHT HEART CATH;  Surgeon: Dolores Patty, MD;  Location: [facility] INVASIVE CV LAB;  Service: Cardiovascular;  Laterality: N/A;    RIGHT/LEFT HEART CATH AND CORONARY ANGIOGRAPHY N/A 12/20/2016   Procedure: RIGHT/LEFT HEART CATH AND CORONARY ANGIOGRAPHY;  Surgeon: Elder Negus, MD;  Location: [facility] INVASIVE CV LAB;  Service: Cardiovascular;  Laterality: N/A;   TEE WITHOUT CARDIOVERSION N/A 03/07/2017   Procedure: TRANSESOPHAGEAL ECHOCARDIOGRAM (TEE);  Surgeon: Donata Clay, Theron Arista, MD;  Location: [facility] OR;  Service: Open Heart Surgery;  Laterality: N/A;   TEE WITHOUT CARDIOVERSION N/A 12/05/2020   Procedure: TRANSESOPHAGEAL ECHOCARDIOGRAM (TEE);  Surgeon: Antoine Poche, MD;  Location: [facility] ORS;  Service: Endoscopy;  Laterality: N/A;  Dr. not available until 2:30   TONSILLECTOMY      Social History   Socioeconomic History   Marital status: Married    Spouse name: Not on file   Number of children: 2   Years of education: Not on file   Highest education level: Not on file  Occupational History   Occupation: general contractor-retired  Tobacco Use   Smoking status: Never   Smokeless tobacco: Never  Vaping Use   Vaping Use: Never used  Substance and Sexual Activity   Alcohol use: No   Drug use: No   Sexual activity: Never  Other Topics Concern   Not on file  Social History Narrative   Not on file   Social Determinants of Health   Financial Resource Strain: Not on file  Food Insecurity: Not on file  Transportation Needs: Not on file  Physical Activity: Not on file  Stress: Not on file  Social Connections: Not on file  Intimate Partner Violence: Not on file   Family History  Problem Relation Age of Onset   Heart disease Mother    Diabetes Mother    Multiple myeloma Mother    Cancer Father        type unknown, as a child   Heart disease Father    Hypertension Father    Congenital heart disease Father    Hyperlipidemia Father    Lymphoma Sister       VITAL SIGNS BP (!) 120/58   Pulse 72   Temp 99.2 F (37.3 C)   Resp 20   SpO2 93%   Outpatient Encounter Medications as of 12/11/2020   Medication Sig   acetaminophen (TYLENOL) 325 MG tablet Take 650 mg by mouth every 8 (eight) hours as needed.   albuterol (VENTOLIN HFA) 108 (90 Base) MCG/ACT inhaler Inhale 1-2 puffs into the lungs every 6 (six) hours as needed for wheezing or shortness of breath.   AMBULATORY NON FORMULARY MEDICATION Oxygen @ 4 liter/minute via [place]  Special Instructions: Document O2 sat qshift. Every Shift; Day, Evening, Night   apixaban (ELIQUIS) 5 MG TABS tablet Take 1 tablet (5 mg total) by mouth 2 (two) times daily.   calcitRIOL (ROCALTROL) 0.5 MCG capsule Take 1 capsule (0.5 mcg total) by mouth every Monday, Wednesday, and Friday.   calcium acetate (PHOSLO) 667 MG capsule Take 1,334 mg by mouth 3 (three) times daily.  ferrous sulfate (FERROUSUL) 325 (65 FE) MG tablet Take 1 tablet (325 mg total) by mouth daily with breakfast.   fluticasone (FLONASE) 50 MCG/ACT nasal spray Place 2 sprays into both nostrils daily as needed for allergies.   insulin aspart (NOVOLOG) 100 UNIT/ML injection Inject 2 Units into the skin 3 (three) times daily with meals. Give if eats 50% or more of meal.   insulin glargine-yfgn (SEMGLEE) 100 UNIT/ML injection Inject 0.1 mLs (10 Units total) into the skin daily at 10 pm.   NON FORMULARY Diet:Consistent Carbohydrate,Renal  Fluid Restrictions: Dietary to provide 480cc/24 hrs. 7-3 shift= 360cc/24hrs. 3-11 shift= 260cc/24hrs. 11-7 shift= 100cc/24 hrs.   pantoprazole (PROTONIX) 40 MG tablet Take 1 tablet (40 mg total) by mouth daily.   silver sulfADIAZINE (SILVADENE) 1 % cream Apply topically 2 (two) times daily.   vancomycin IVPB Inject 1,250 mg into the vein every Monday, Wednesday, and Friday with hemodialysis. Indication:  MRSA bacteremia First Dose: Yes Last Day of Therapy:  01/15/2021 Labs - Sunday/Monday:  CBC/D, BMP, and vancomycin trough. Labs - Thursday:  BMP and vancomycin trough Labs - Every other week:  ESR and CRP Method of administration:Elastomeric Method of  administration may be changed at the discretion of the patient and/or caregiver's ability to self-administer the medication ordered.   ceFAZolin (ANCEF) IVPB Inject 2 g into the vein every Monday, Wednesday, and Friday with hemodialysis. Indication:  E. Coli bacteremia First Dose: Yes Last Day of Therapy:  01/15/2021  Labs - Once weekly:  CBC/D and BMP, Labs - Every other week:  ESR and CRP Method of administration: IV Push Method of administration may be changed at the discretion of home infusion pharmacist based upon assessment of the patient and/or caregiver's ability to self-administer the medication ordered.   No facility-administered encounter medications on file as of 12/11/2020.     SIGNIFICANT DIAGNOSTIC EXAMS  TODAY  11-27-20: right foot x-ray:  Lateral forefoot soft tissue defect. No frank bony destruction radiographically to suggest osteomyelitis. MRI would be more sensitive.  11-29-20: chest x-ray:  Interval removal of right hemodialysis catheter. CHF.  Layering effusions.  12-01-20: MRI right foot:  1. Soft tissue ulceration along the lateral forefoot near the fifth metatarsal head with underlying 2.3 cm curvilinear fluid collection, concerning for abscess. 2. Faint marrow edema in the fifth metatarsal head is nonspecific, but concerning for early osteomyelitis given adjacent abscess and soft tissue ulcer.  12-04-20: abdominal ultrasound:  1. Negative for gallstones. Slight increased gallbladder wall thickness but negative sonographic Percell Miller, findings are nonspecific. There is a small amount of fluid adjacent to the gallbladder likely related to generalized small ascites in the right upper quadrant. 2. Liver grossly within normal limits. 3. Mild splenomegaly 4. Small amount of ascites within the upper and lower abdomen. 5. Bilateral pleural effusions  12-05-20: MRI lumbar spine:  Degraded by motion.  No evidence of discitis/osteomyelitis. Multilevel degenerative  changes as detailed above. There is marked canal stenosis at L4-L5 with effacement of subarticular recesses. Facet arthropathy is also greatest at this level. The L3-L4 level was not included on axial imaging due to technical error. There is likely at least mild canal stenosis, mild to moderate right foraminal stenosis, and mild left foraminal stenosis. Facet arthropathy is present at this level. If desired, patient can be brought back for repeat scanning without charge.  12-05-20: TEE echo:  1. Left ventricular ejection fraction, by estimation, is 50 to 55%. The  left ventricle has low normal function.  LABS REVIEWED TODAY  11-27-20: wbc 15.8; hgb 9.1; hct 30.1; mcv 90.0 plt 156; glucose 178; bun 74; creat 6.75; k+ 3.8; na++ 135; ca 8.0; GFR 8; phos 4.4 albumin 2.6; d-dimer 4.65; blood culture: staphylococcus 11-30-20: hgb a1c 6.3 12-01-20: wbc 14.1; hgb 9.3; hct 30.3; mcv 90.4 plt 166; glucose 162; bun 69; creat 6.35; k+ 4.8; na++ 130; ca 8.4; GFR 9; phos 3.9; albumin 2.5 12-04-20: wbc 13.9; hgb 9.7;hct 31.5  mcv 89.0 plt 214; glucose 283; bun 97; creat 7.55; k+ 4.2; na++ 129; ca 8.1; GFR 11; ast 138; alt 180; alk phos 194; albumin 2.6 12-05-20: glucose 237; bun 71; creat 6.28; k+ 4.0; na++ 129; ca 8.0; GFR 9; ast 88; alt 141; alk phos 184; albumin 2.6 11/25/2020: wbc 13.9; hgb 9.4; hct 30.3 mcv 89,4 plt 253; glucose 191; bun 77; creat 6.51; k+ 4.4; na++ 126; ca 8.7; GFR 9  phos 5.4; albumin 2.6   Review of Systems  Constitutional:  Positive for malaise/fatigue.  Respiratory:  Negative for cough and shortness of breath.   Cardiovascular:  Negative for chest pain, palpitations and leg swelling.  Gastrointestinal:  Negative for abdominal pain, constipation and heartburn.  Musculoskeletal:  Negative for back pain, joint pain and myalgias.  Skin: Negative.   Neurological:  Negative for dizziness.  Psychiatric/Behavioral:  The patient is not nervous/anxious.    Physical Exam Constitutional:       General: He is not in acute distress.    Appearance: He is well-developed and overweight. He is not diaphoretic.  Neck:     Thyroid: No thyromegaly.  Cardiovascular:     Rate and Rhythm: Normal rate and regular rhythm.     Pulses: Normal pulses.     Heart sounds: Normal heart sounds.  Pulmonary:     Effort: Pulmonary effort is normal. No respiratory distress.     Breath sounds: Normal breath sounds.  Abdominal:     General: Bowel sounds are normal. There is no distension.     Palpations: Abdomen is soft.     Tenderness: There is no abdominal tenderness.  Musculoskeletal:        General: Normal range of motion.     Cervical back: Neck supple.     Right lower leg: No edema.     Left lower leg: No edema.  Lymphadenopathy:     Cervical: No cervical adenopathy.  Skin:    General: Skin is warm and dry.     Comments: Left arm: + thrill + bruit   Neurological:     Mental Status: He is alert and oriented to person, place, and time.  Psychiatric:        Mood and Affect: Mood normal.      ASSESSMENT/ PLAN:  TODAY  Sepsis due to methicillin staphylococcus aureus (MRSA) / bacteremia due to gram negative bacteria: right foot diabetic ulcer: is stable will complete IV vancomycin and ancef at dialysis through 01-16-21.   2. End stage renal disease on dialysis due to type 2 diabetes/dependence on dialysis: is stable is on fluid restriction hemodialysis three days per week; will continue phoslo 1334 mg with meals.   3. Type 2 diabetes mellitus with end stage renal disease: is stable hgb a1c 6.3 will continue semglee 10 units nightly and novolog 2 units with meals.   4.  Secondary hyperparathyroidism of renal origin: is stable will continue calcitroil 0.5 mg 3 days per week  5. Pulmonary embolism on right/chronic deep vein thrombosis (DVT) of left popliteal vein:  is stable will continue eliquis 5 mg twice daily   6. Diabetic ulcer of other part of foot associated with type 2 diabetes  mellitus: is without change: will continue current treatment and will monitor his status.   7. Anemia due to end stage renal disease: hgb 9.4 will continue iron daily   8. Persistent atrial fibrillation: heart rate is stable will continue eliquis 5 mg twice daily   9. Chronic combined systolic and diastolic congestive heart failure: EF 50-55%; will monitor   10. Hypertension due to end stage renal disease due to type 2 diabetes mellitus: will monitor  11. GERD without esophagitis: is stable will continue protonix 40 mg daily   12. Acute respiratory failure with hypoxia: is stable is on 02 at 4 liters; will continue flonase daily as needed; albuterol 1 or 2 puffs every 8 hours as needed    Ok Edwards NP [facility]  Contact [phone] Monday through Friday 8am- 5pm  After hours call [phone]

## 2020-12-11 NOTE — Telephone Encounter (Signed)
Left message for pt's wife to discuss. Unclear if he is in SNF long term vs going home soon. Would need to figure out if facility would dose warfarin and monitor INRs or if pt would need to come to clinic for appts.  Can also discuss trying a different DOAC since his reported side effects with Eliquis are not commonly seen with the med and may be unrelated.

## 2020-12-12 ENCOUNTER — Encounter: Payer: Self-pay | Admitting: Adult Health

## 2020-12-12 ENCOUNTER — Non-Acute Institutional Stay (SKILLED_NURSING_FACILITY): Payer: PPO | Admitting: Adult Health

## 2020-12-12 DIAGNOSIS — A4102 Sepsis due to Methicillin resistant Staphylococcus aureus: Secondary | ICD-10-CM | POA: Diagnosis not present

## 2020-12-12 DIAGNOSIS — E1122 Type 2 diabetes mellitus with diabetic chronic kidney disease: Secondary | ICD-10-CM

## 2020-12-12 DIAGNOSIS — J9601 Acute respiratory failure with hypoxia: Secondary | ICD-10-CM

## 2020-12-12 DIAGNOSIS — N186 End stage renal disease: Secondary | ICD-10-CM | POA: Diagnosis not present

## 2020-12-12 DIAGNOSIS — Z992 Dependence on renal dialysis: Secondary | ICD-10-CM | POA: Diagnosis not present

## 2020-12-12 NOTE — Progress Notes (Signed)
Location:  [facility] Room Number: 151-P Place of Service:  SNF (31)   CODE STATUS: DNR  Allergies  Allergen Reactions   Latex Hives and Rash   Lipitor [Atorvastatin]     Leg aches     Chief Complaint  Patient presents with   Acute Visit    72 hour care plan meeting     HPI:  We have come together for his 76 hour care plan meeting to begin discharge planning. He lives with his wife; who is his primary care giver. They live in a one level house; will need a ramp; family will build one. The 02 is new. His wife has been doing his wound care. He has not taken an actual shower since March of this year. He has been able to bathe himself and dress himself. He has had rollator and walker at home. His wife would like to change him to coumadin therapy as the eliquis tends to make him confused. The goal of his care is to return back home.   Past Medical History:  Diagnosis Date   Anemia associated with chronic renal failure basically   Asthma    as a child   CHF (congestive heart failure) ([place])    Chronic kidney disease    Sees Dr [name]   Constipation    Coronary artery disease    COVID-19    Depression    Diabetes mellitus without complication ([place])    DVT (deep venous thrombosis) ([place])    History of blood clots    Hyperlipidemia    Hypertension    Leg pain    ABIs/LE Arterial US 03/2019: ABIs unreliable; +calcification, no evidence of stenosis   Myocardial infarction [facility])    Obesity    Pneumonia 2018   Pulmonary embolism ([facility])    Sleep apnea    CPAP   SOB (shortness of breath)    Swelling of both lower extremities     Past Surgical History:  Procedure Laterality Date   A/V FISTULAGRAM Left 09/29/2020   Procedure: A/V FISTULAGRAM;  Surgeon: Angelia Mould, MD;  Location: [facility] CV [facility];  Service: Cardiovascular;  Laterality: Left;   AV FISTULA PLACEMENT Left 05/04/2020   Procedure: LEFT ARM ARTERIOVENOUS (AV) FISTULA CREATION;   Surgeon: Rosetta Posner, MD;  Location: [facility];  Service: Vascular;  Laterality: Left;   COLONOSCOPY     CORONARY ARTERY BYPASS GRAFT N/A 03/07/2017   Procedure: CORONARY ARTERY BYPASS GRAFTING (CABG), X 3 , USING RIGHT INTERNAL MAMMARY ARTERY, AND RIGHT LEG GREATER SAPHENOUS VEIN HARVESTED ENDOSCOPICALLY;  Surgeon: [name], MD;  Location: [facility];  Service: Open Heart Surgery;  Laterality: N/A;   ESOPHAGEAL DILATION  12/02/2020   Procedure: ESOPHAGEAL DILATION;  Surgeon: Rogene Houston, MD;  Location[facility];  Service: Endoscopy;;   ESOPHAGOGASTRODUODENOSCOPY (EGD) WITH PROPOFOL N/A 12/02/2020   Procedure: ESOPHAGOGASTRODUODENOSCOPY (EGD) WITH PROPOFOL;  Surgeon: Rogene Houston, MD;  [facility];  Service: Endoscopy;  Laterality: N/A;   FISTULA SUPERFICIALIZATION Left 10/09/2020   Procedure: FISTULA SUPERFICIALIZATION;  Surgeon: Angelia Mould, MD;  Location: [place];  Service: Vascular;  Laterality: Left;   HAND SURGERY Right 1991   IR FLUORO GUIDE CV LINE RIGHT  09/04/2020   IR US GUIDE VASC ACCESS RIGHT  09/04/2020   LIGATION OF COMPETING BRANCHES OF ARTERIOVENOUS FISTULA Left 10/09/2020   Procedure: LIGATION OF 2 LARGE COMPETING BRANCHES OF LEFT BRACHIOCEPHALIC FISTULA;  Surgeon: Scot Dock,  Judeth Cornfield, MD;  Location: [facility];  Service: Vascular;  Laterality: Left;   RIGHT HEART CATH N/A 07/07/2020   Procedure: RIGHT HEART CATH;  Surgeon: Jolaine Artist, MD;  Location: [place] CV [facility];  Service: Cardiovascular;  Laterality: N/A;   RIGHT/LEFT HEART CATH AND CORONARY ANGIOGRAPHY N/A 12/20/2016   Procedure: RIGHT/LEFT HEART CATH AND CORONARY ANGIOGRAPHY;  Surgeon: [name], MD;  Location: [facility];  Service: Cardiovascular;  Laterality: N/A;   TEE WITHOUT CARDIOVERSION N/A 03/07/2017   Procedure: TRANSESOPHAGEAL ECHOCARDIOGRAM (TEE);  Surgeon: [name], [name], MD;  Location: [facility];  Service: Open Heart Surgery;  Laterality: N/A;   TEE  WITHOUT CARDIOVERSION N/A 12/05/2020   Procedure: TRANSESOPHAGEAL ECHOCARDIOGRAM (TEE);  Surgeon: Arnoldo Lenis, MD;  Location[facility];  Service: Endoscopy;  Laterality: N/A;  Dr. not available until 2:30   TONSILLECTOMY      Social History   Socioeconomic History   Marital status: Married    Spouse name: Not on file   Number of children: 2   Years of education: Not on file   Highest education level: Not on file  Occupational History   Occupation: general contractor-retired  Tobacco Use   Smoking status: Never   Smokeless tobacco: Never  Vaping Use   Vaping Use: Never used  Substance and Sexual Activity   Alcohol use: No   Drug use: No   Sexual activity: Never  Other Topics Concern   Not on file  Social History Narrative   Not on file   Social Determinants of Health   Financial Resource Strain: Not on file  Food Insecurity: Not on file  Transportation Needs: Not on file  Physical Activity: Not on file  Stress: Not on file  Social Connections: Not on file  Intimate Partner Violence: Not on file   Family History  Problem Relation Age of Onset   Heart disease Mother    Diabetes Mother    Multiple myeloma Mother    Cancer Father        type unknown, as a child   Heart disease Father    Hypertension Father    Congenital heart disease Father    Hyperlipidemia Father    Lymphoma Sister       VITAL SIGNS BP 133/70   Pulse 72   Temp 98 F (36.7 C) (Temporal)   Resp 18   SpO2 90%   Outpatient Encounter Medications as of 12/12/2020  Medication Sig   albuterol (VENTOLIN HFA) 108 (90 Base) MCG/ACT inhaler Inhale 1-2 puffs into the lungs every 6 (six) hours as needed for wheezing or shortness of breath.   apixaban (ELIQUIS) 5 MG TABS tablet Take 1 tablet (5 mg total) by mouth 2 (two) times daily.   calcitRIOL (ROCALTROL) 0.5 MCG capsule Take 1 capsule (0.5 mcg total) by mouth every Monday, Wednesday, and Friday.   calcium acetate (PHOSLO) 667 MG capsule Take  1,334 mg by mouth 3 (three) times daily.   ferrous sulfate (FERROUSUL) 325 (65 FE) MG tablet Take 1 tablet (325 mg total) by mouth daily with breakfast.   fluticasone (FLONASE) 50 MCG/ACT nasal spray Place 2 sprays into both nostrils daily as needed for allergies.   insulin aspart (NOVOLOG) 100 UNIT/ML injection Inject 2 Units into the skin 3 (three) times daily with meals. Give if eats 50% or more of meal.   insulin glargine-yfgn (SEMGLEE) 100 UNIT/ML injection Inject 0.1 mLs (10 Units total) into the skin daily at 10  pm.   NON FORMULARY Diet:Consistent Carbohydrate,Renal  Fluid Restrictions: Dietary to provide 480cc/24 hrs. 7-3 shift= 360cc/24hrs. 3-11 shift= 260cc/24hrs. 11-7 shift= 100cc/24 hrs.   OXYGEN Inhale 4 L into the lungs continuous. Document O2 sat qshift   pantoprazole (PROTONIX) 40 MG tablet Take 1 tablet (40 mg total) by mouth daily.   silver sulfADIAZINE (SILVADENE) 1 % cream Apply topically 2 (two) times daily.   ceFAZolin (ANCEF) IVPB Inject 2 g into the vein every Monday, Wednesday, and Friday with hemodialysis. Indication:  E. Coli bacteremia First Dose: Yes Last Day of Therapy:  01/15/2021  Labs - Once weekly:  CBC/D and BMP, Labs - Every other week:  ESR and CRP Method of administration: IV Push Method of administration may be changed at the discretion of home infusion pharmacist based upon assessment of the patient and/or caregiver's ability to self-administer the medication ordered.   vancomycin IVPB Inject 1,250 mg into the vein every Monday, Wednesday, and Friday with hemodialysis. Indication:  MRSA bacteremia First Dose: Yes Last Day of Therapy:  01/15/2021 Labs - Sunday/Monday:  CBC/D, BMP, and vancomycin trough. Labs - Thursday:  BMP and vancomycin trough Labs - Every other week:  ESR and CRP Method of administration:Elastomeric Method of administration may be changed at the discretion of the patient and/or caregiver's ability to self-administer the medication  ordered.   [DISCONTINUED] acetaminophen (TYLENOL) 325 MG tablet Take 650 mg by mouth every 8 (eight) hours as needed.   [DISCONTINUED] AMBULATORY NON FORMULARY MEDICATION Oxygen @ 4 liter/minute via [place]  Special Instructions: Document O2 sat qshift. Every Shift; Day, Evening, Night   No facility-administered encounter medications on file as of 12/12/2020.     SIGNIFICANT DIAGNOSTIC EXAMS  PREVIOUS   11-27-20: right foot x-ray:  Lateral forefoot soft tissue defect. No frank bony destruction radiographically to suggest osteomyelitis. MRI would be more sensitive.  11-29-20: chest x-ray:  Interval removal of right hemodialysis catheter. CHF.  Layering effusions.  12-01-20: MRI right foot:  1. Soft tissue ulceration along the lateral forefoot near the fifth metatarsal head with underlying 2.3 cm curvilinear fluid collection, concerning for abscess. 2. Faint marrow edema in the fifth metatarsal head is nonspecific, but concerning for early osteomyelitis given adjacent abscess and soft tissue ulcer.  12-04-20: abdominal ultrasound:  1. Negative for gallstones. Slight increased gallbladder wall thickness but negative sonographic Percell Miller, findings are nonspecific. There is a small amount of fluid adjacent to the gallbladder likely related to generalized small ascites in the right upper quadrant. 2. Liver grossly within normal limits. 3. Mild splenomegaly 4. Small amount of ascites within the upper and lower abdomen. 5. Bilateral pleural effusions  12-05-20: MRI lumbar spine:  Degraded by motion.  No evidence of discitis/osteomyelitis. Multilevel degenerative changes as detailed above. There is marked canal stenosis at L4-L5 with effacement of subarticular recesses. Facet arthropathy is also greatest at this level. The L3-L4 level was not included on axial imaging due to technical error. There is likely at least mild canal stenosis, mild to moderate right foraminal stenosis, and mild left  foraminal stenosis. Facet arthropathy is present at this level. If desired, patient can be brought back for repeat scanning without charge.  12-05-20: TEE echo:  1. Left ventricular ejection fraction, by estimation, is 50 to 55%. The  left ventricle has low normal function.   NO NEW EXAMS    LABS REVIEWED PREVIOUS   11-27-20: wbc 15.8; hgb 9.1; hct 30.1; mcv 90.0 plt 156; glucose 178; bun 74; creat 6.75;  k+ 3.8; na++ 135; ca 8.0; GFR 8; phos 4.4 albumin 2.6; d-dimer 4.65; blood culture: staphylococcus 11-30-20: hgb a1c 6.3 12-01-20: wbc 14.1; hgb 9.3; hct 30.3; mcv 90.4 plt 166; glucose 162; bun 69; creat 6.35; k+ 4.8; na++ 130; ca 8.4; GFR 9; phos 3.9; albumin 2.5 12-04-20: wbc 13.9; hgb 9.7;hct 31.5  mcv 89.0 plt 214; glucose 283; bun 97; creat 7.55; k+ 4.2; na++ 129; ca 8.1; GFR 11; ast 138; alt 180; alk phos 194; albumin 2.6 12-05-20: glucose 237; bun 71; creat 6.28; k+ 4.0; na++ 129; ca 8.0; GFR 9; ast 88; alt 141; alk phos 184; albumin 2.6 11/26/2020: wbc 13.9; hgb 9.4; hct 30.3 mcv 89,4 plt 253; glucose 191; bun 77; creat 6.51; k+ 4.4; na++ 126; ca 8.7; GFR 9  phos 5.4; albumin 2.6   NO NEW LABS.   Review of Systems  Constitutional:  Negative for malaise/fatigue.  Respiratory:  Negative for cough and shortness of breath.   Cardiovascular:  Negative for chest pain, palpitations and leg swelling.  Gastrointestinal:  Negative for abdominal pain, constipation and heartburn.  Musculoskeletal:  Negative for back pain, joint pain and myalgias.  Skin: Negative.   Neurological:  Negative for dizziness.  Psychiatric/Behavioral:  The patient is not nervous/anxious.     Physical Exam Constitutional:      General: He is not in acute distress.    Appearance: He is well-developed. He is obese. He is not diaphoretic.  Neck:     Thyroid: No thyromegaly.  Cardiovascular:     Rate and Rhythm: Normal rate and regular rhythm.     Pulses: Normal pulses.     Heart sounds: Normal heart sounds.   Pulmonary:     Effort: Pulmonary effort is normal. No respiratory distress.     Breath sounds: Normal breath sounds.  Abdominal:     General: Bowel sounds are normal. There is no distension.     Palpations: Abdomen is soft.     Tenderness: There is no abdominal tenderness.  Musculoskeletal:        General: Normal range of motion.     Cervical back: Neck supple.     Right lower leg: No edema.     Left lower leg: No edema.  Lymphadenopathy:     Cervical: No cervical adenopathy.  Skin:    General: Skin is warm and dry.     Comments:  Left arm: + thrill + bruit    Neurological:     Mental Status: He is alert. Mental status is at baseline.  Psychiatric:        Mood and Affect: Mood normal.      ASSESSMENT/ PLAN:  TODAY  Sepsis due to methicillin resistant staphylococcus aureus (MRSA)   unspecified whether acute organ dysfunction End stage renal disease due to type 2 diabetes mellitus Acute respiratory failure with hypoxia  Will continue therapy as directed Will setup palliative care consult Will stop eliquis in 3 days Will begin coumadin 10 mg today then 5 mg daily  Will check INR in the AM.   Time spent with patient 40 minutes: therapy needs; goals of care; medications and medication changes.    Ok Edwards NP [facility]  Contact [phone] Monday through Friday 8am- 5pm  After hours call [phone]

## 2020-12-12 DEATH — deceased

## 2020-12-13 DIAGNOSIS — N186 End stage renal disease: Secondary | ICD-10-CM | POA: Diagnosis not present

## 2020-12-13 DIAGNOSIS — I2699 Other pulmonary embolism without acute cor pulmonale: Secondary | ICD-10-CM | POA: Insufficient documentation

## 2020-12-13 DIAGNOSIS — I82532 Chronic embolism and thrombosis of left popliteal vein: Secondary | ICD-10-CM | POA: Insufficient documentation

## 2020-12-13 DIAGNOSIS — R52 Pain, unspecified: Secondary | ICD-10-CM | POA: Diagnosis not present

## 2020-12-13 DIAGNOSIS — D689 Coagulation defect, unspecified: Secondary | ICD-10-CM | POA: Diagnosis not present

## 2020-12-13 DIAGNOSIS — E1122 Type 2 diabetes mellitus with diabetic chronic kidney disease: Secondary | ICD-10-CM | POA: Insufficient documentation

## 2020-12-13 DIAGNOSIS — E11621 Type 2 diabetes mellitus with foot ulcer: Secondary | ICD-10-CM | POA: Insufficient documentation

## 2020-12-13 DIAGNOSIS — Z23 Encounter for immunization: Secondary | ICD-10-CM | POA: Diagnosis not present

## 2020-12-13 DIAGNOSIS — D631 Anemia in chronic kidney disease: Secondary | ICD-10-CM | POA: Insufficient documentation

## 2020-12-13 DIAGNOSIS — Z992 Dependence on renal dialysis: Secondary | ICD-10-CM | POA: Insufficient documentation

## 2020-12-13 DIAGNOSIS — N2581 Secondary hyperparathyroidism of renal origin: Secondary | ICD-10-CM | POA: Diagnosis not present

## 2020-12-13 DIAGNOSIS — K219 Gastro-esophageal reflux disease without esophagitis: Secondary | ICD-10-CM | POA: Insufficient documentation

## 2020-12-13 DIAGNOSIS — R7881 Bacteremia: Secondary | ICD-10-CM | POA: Diagnosis not present

## 2020-12-14 ENCOUNTER — Other Ambulatory Visit: Payer: Self-pay | Admitting: Adult Health

## 2020-12-14 ENCOUNTER — Encounter: Payer: Self-pay | Admitting: Adult Health

## 2020-12-14 ENCOUNTER — Non-Acute Institutional Stay (SKILLED_NURSING_FACILITY): Payer: PPO | Admitting: Adult Health

## 2020-12-14 DIAGNOSIS — R627 Adult failure to thrive: Secondary | ICD-10-CM | POA: Diagnosis not present

## 2020-12-14 DIAGNOSIS — N186 End stage renal disease: Secondary | ICD-10-CM

## 2020-12-14 DIAGNOSIS — E1122 Type 2 diabetes mellitus with diabetic chronic kidney disease: Secondary | ICD-10-CM

## 2020-12-14 DIAGNOSIS — Z992 Dependence on renal dialysis: Secondary | ICD-10-CM

## 2020-12-14 MED ORDER — LORAZEPAM 2 MG/ML PO CONC: 0.5000 mg | Freq: Four times a day (QID) | ORAL | 0 refills | Status: AC | PRN

## 2020-12-14 MED ORDER — MORPHINE SULFATE (CONCENTRATE) 20 MG/ML PO SOLN: 5.0000 mg | ORAL | 0 refills | Status: AC | PRN

## 2020-12-14 NOTE — Progress Notes (Signed)
Location:  [facility] Room Number: 151 Place of Service:  SNF (31)   CODE STATUS: dnr   Allergies  Allergen Reactions   Latex Hives and Rash   Lipitor [Atorvastatin]     Leg aches     Chief Complaint  Patient presents with   Acute Visit    Change in status     HPI:  He has been slowly declining since starting dialysis. Since this admission he has continued to decline. He has decided to stop dialysis. He wants to focus upon comfort only. He is wanting his medications stopped. He would like to have pain medications. He is having back pain which is not being adequately managed at this time. The focus of his care is to be comfort.   Past Medical History:  Diagnosis Date   Anemia associated with chronic renal failure basically   Asthma    as a child   CHF (congestive heart failure) ([place])    Chronic kidney disease    Sees Dr [name]   Constipation    Coronary artery disease    COVID-19    Depression    Diabetes mellitus without complication ([place])    DVT (deep venous thrombosis) ([place])    History of blood clots    Hyperlipidemia    Hypertension    Leg pain    ABIs/LE Arterial US 03/2019: ABIs unreliable; +calcification, no evidence of stenosis   Myocardial infarction [facility])    Obesity    Pneumonia 2018   Pulmonary embolism (H[facility])    Sleep apnea    CPAP   SOB (shortness of breath)    Swelling of both lower extremities     Past Surgical History:  Procedure Laterality Date   A/V FISTULAGRAM Left 09/29/2020   Procedure: A/V FISTULAGRAM;  Surgeon: Angelia Mould, MD;  Location: [place] CV LAB;  Service: Cardiovascular;  Laterality: Left;   AV FISTULA PLACEMENT Left 05/04/2020   Procedure: LEFT ARM ARTERIOVENOUS (AV) FISTULA CREATION;  Surgeon: Rosetta Posner, MD;  Location: [facility];  Service: Vascular;  Laterality: Left;   COLONOSCOPY     CORONARY ARTERY BYPASS GRAFT N/A 03/07/2017   Procedure: CORONARY ARTERY BYPASS GRAFTING (CABG), X 3 ,  USING RIGHT INTERNAL MAMMARY ARTERY, AND RIGHT LEG GREATER SAPHENOUS VEIN HARVESTED ENDOSCOPICALLY;  Surgeon: [name], MD;  Location: [place];  Service: Open Heart Surgery;  Laterality: N/A;   ESOPHAGEAL DILATION  12/02/2020   Procedure: ESOPHAGEAL DILATION;  Surgeon: Rogene Houston, MD;  Location: [facility];  Service: Endoscopy;;   ESOPHAGOGASTRODUODENOSCOPY (EGD) WITH PROPOFOL N/A 12/02/2020   Procedure: ESOPHAGOGASTRODUODENOSCOPY (EGD) WITH PROPOFOL;  Surgeon: Rogene Houston, MD;  [facility];  Service: Endoscopy;  Laterality: N/A;   FISTULA SUPERFICIALIZATION Left 10/09/2020   Procedure: FISTULA SUPERFICIALIZATION;  Surgeon: Angelia Mould, MD;  [facility] OR;  Service: Vascular;  Laterality: Left;   HAND SURGERY Right 1991   IR FLUORO GUIDE CV LINE RIGHT  09/04/2020   IR US GUIDE VASC ACCESS RIGHT  09/04/2020   LIGATION OF COMPETING BRANCHES OF ARTERIOVENOUS FISTULA Left 10/09/2020   Procedure: LIGATION OF 2 LARGE COMPETING BRANCHES OF LEFT BRACHIOCEPHALIC FISTULA;  Surgeon: Angelia Mould, MD;  Location: [place];  Service: Vascular;  Laterality: Left;   RIGHT HEART CATH N/A 07/07/2020   Procedure: RIGHT HEART CATH;  Surgeon: Jolaine Artist, MD;  Location: [place] CV LAB;  Service: Cardiovascular;  Laterality: N/A;  RIGHT/LEFT HEART CATH AND CORONARY ANGIOGRAPHY N/A 12/20/2016   Procedure: RIGHT/LEFT HEART CATH AND CORONARY ANGIOGRAPHY;  Surgeon: [name], MD;  Location: [place] CV LAB;  Service: Cardiovascular;  Laterality: N/A;   TEE WITHOUT CARDIOVERSION N/A 03/07/2017   Procedure: TRANSESOPHAGEAL ECHOCARDIOGRAM (TEE);  Surgeon: [name], [name], MD;  Location: [place];  Service: Open Heart Surgery;  Laterality: N/A;   TEE WITHOUT CARDIOVERSION N/A 12/05/2020   Procedure: TRANSESOPHAGEAL ECHOCARDIOGRAM (TEE);  Surgeon: Arnoldo Lenis, MD;  Location: [facility];  Service: Endoscopy;  Laterality: N/A;  Dr. not available until 2:30    TONSILLECTOMY      Social History   Socioeconomic History   Marital status: Married    Spouse name: Not on file   Number of children: 2   Years of education: Not on file   Highest education level: Not on file  Occupational History   Occupation: general contractor-retired  Tobacco Use   Smoking status: Never   Smokeless tobacco: Never  Vaping Use   Vaping Use: Never used  Substance and Sexual Activity   Alcohol use: No   Drug use: No   Sexual activity: Never  Other Topics Concern   Not on file  Social History Narrative   Not on file   Social Determinants of Health   Financial Resource Strain: Not on file  Food Insecurity: Not on file  Transportation Needs: Not on file  Physical Activity: Not on file  Stress: Not on file  Social Connections: Not on file  Intimate Partner Violence: Not on file   Family History  Problem Relation Age of Onset   Heart disease Mother    Diabetes Mother    Multiple myeloma Mother    Cancer Father        type unknown, as a child   Heart disease Father    Hypertension Father    Congenital heart disease Father    Hyperlipidemia Father    Lymphoma Sister       VITAL SIGNS BP 109/65   Pulse 72   Temp 98 F (36.7 C)   Ht $R'6\' 2"'fN$  (1.88 m)   Wt 251 lb 9.6 oz (114.1 kg)   BMI 32.30 kg/m   Outpatient Encounter Medications as of 12/14/2020  Medication Sig   albuterol (VENTOLIN HFA) 108 (90 Base) MCG/ACT inhaler Inhale 1-2 puffs into the lungs every 6 (six) hours as needed for wheezing or shortness of breath.   apixaban (ELIQUIS) 5 MG TABS tablet Take 1 tablet (5 mg total) by mouth 2 (two) times daily.   calcitRIOL (ROCALTROL) 0.5 MCG capsule Take 1 capsule (0.5 mcg total) by mouth every Monday, Wednesday, and Friday.   calcium acetate (PHOSLO) 667 MG capsule Take 1,334 mg by mouth 3 (three) times daily.   ceFAZolin (ANCEF) IVPB Inject 2 g into the vein every Monday, Wednesday, and Friday with hemodialysis. Indication:  E. Coli  bacteremia First Dose: Yes Last Day of Therapy:  01/15/2021  Labs - Once weekly:  CBC/D and BMP, Labs - Every other week:  ESR and CRP Method of administration: IV Push Method of administration may be changed at the discretion of home infusion pharmacist based upon assessment of the patient and/or caregiver's ability to self-administer the medication ordered.   ferrous sulfate (FERROUSUL) 325 (65 FE) MG tablet Take 1 tablet (325 mg total) by mouth daily with breakfast.   fluticasone (FLONASE) 50 MCG/ACT nasal spray Place 2 sprays into both nostrils daily as needed for allergies.  insulin aspart (NOVOLOG) 100 UNIT/ML injection Inject 2 Units into the skin 3 (three) times daily with meals. Give if eats 50% or more of meal.   insulin glargine-yfgn (SEMGLEE) 100 UNIT/ML injection Inject 0.1 mLs (10 Units total) into the skin daily at 10 pm.   LORazepam (LORAZEPAM INTENSOL) 2 MG/ML concentrated solution Take 0.3 mLs (0.6 mg total) by mouth every 6 (six) hours as needed for anxiety.   morphine (ROXANOL) 20 MG/ML concentrated solution Take 0.25 mLs (5 mg total) by mouth every 4 (four) hours as needed.   NON FORMULARY Diet:Consistent Carbohydrate,Renal  Fluid Restrictions: Dietary to provide 480cc/24 hrs. 7-3 shift= 360cc/24hrs. 3-11 shift= 260cc/24hrs. 11-7 shift= 100cc/24 hrs.   OXYGEN Inhale 4 L into the lungs continuous. Document O2 sat qshift   pantoprazole (PROTONIX) 40 MG tablet Take 1 tablet (40 mg total) by mouth daily.   silver sulfADIAZINE (SILVADENE) 1 % cream Apply topically 2 (two) times daily.   vancomycin IVPB Inject 1,250 mg into the vein every Monday, Wednesday, and Friday with hemodialysis. Indication:  MRSA bacteremia First Dose: Yes Last Day of Therapy:  01/15/2021 Labs - Sunday/Monday:  CBC/D, BMP, and vancomycin trough. Labs - Thursday:  BMP and vancomycin trough Labs - Every other week:  ESR and CRP Method of administration:Elastomeric Method of administration may be  changed at the discretion of the patient and/or caregiver's ability to self-administer the medication ordered.   No facility-administered encounter medications on file as of 12/14/2020.     SIGNIFICANT DIAGNOSTIC EXAMS   PREVIOUS   11-27-20: right foot x-ray:  Lateral forefoot soft tissue defect. No frank bony destruction radiographically to suggest osteomyelitis. MRI would be more sensitive.  11-29-20: chest x-ray:  Interval removal of right hemodialysis catheter. CHF.  Layering effusions.  12-01-20: MRI right foot:  1. Soft tissue ulceration along the lateral forefoot near the fifth metatarsal head with underlying 2.3 cm curvilinear fluid collection, concerning for abscess. 2. Faint marrow edema in the fifth metatarsal head is nonspecific, but concerning for early osteomyelitis given adjacent abscess and soft tissue ulcer.  12-04-20: abdominal ultrasound:  1. Negative for gallstones. Slight increased gallbladder wall thickness but negative sonographic Percell Miller, findings are nonspecific. There is a small amount of fluid adjacent to the gallbladder likely related to generalized small ascites in the right upper quadrant. 2. Liver grossly within normal limits. 3. Mild splenomegaly 4. Small amount of ascites within the upper and lower abdomen. 5. Bilateral pleural effusions  12-05-20: MRI lumbar spine:  Degraded by motion.  No evidence of discitis/osteomyelitis. Multilevel degenerative changes as detailed above. There is marked canal stenosis at L4-L5 with effacement of subarticular recesses. Facet arthropathy is also greatest at this level. The L3-L4 level was not included on axial imaging due to technical error. There is likely at least mild canal stenosis, mild to moderate right foraminal stenosis, and mild left foraminal stenosis. Facet arthropathy is present at this level. If desired, patient can be brought back for repeat scanning without charge.  12-05-20: TEE echo:  1. Left  ventricular ejection fraction, by estimation, is 50 to 55%. The  left ventricle has low normal function.   NO NEW EXAMS    LABS REVIEWED PREVIOUS   11-27-20: wbc 15.8; hgb 9.1; hct 30.1; mcv 90.0 plt 156; glucose 178; bun 74; creat 6.75; k+ 3.8; na++ 135; ca 8.0; GFR 8; phos 4.4 albumin 2.6; d-dimer 4.65; blood culture: staphylococcus 11-30-20: hgb a1c 6.3 12-01-20: wbc 14.1; hgb 9.3; hct 30.3; mcv 90.4 plt 166;  glucose 162; bun 69; creat 6.35; k+ 4.8; na++ 130; ca 8.4; GFR 9; phos 3.9; albumin 2.5 12-04-20: wbc 13.9; hgb 9.7;hct 31.5  mcv 89.0 plt 214; glucose 283; bun 97; creat 7.55; k+ 4.2; na++ 129; ca 8.1; GFR 11; ast 138; alt 180; alk phos 194; albumin 2.6 12-05-20: glucose 237; bun 71; creat 6.28; k+ 4.0; na++ 129; ca 8.0; GFR 9; ast 88; alt 141; alk phos 184; albumin 2.6 12/04/2020: wbc 13.9; hgb 9.4; hct 30.3 mcv 89,4 plt 253; glucose 191; bun 77; creat 6.51; k+ 4.4; na++ 126; ca 8.7; GFR 9  phos 5.4; albumin 2.6   NO NEW LABS.   Review of Systems  Constitutional:  Negative for malaise/fatigue.  Respiratory:  Negative for cough and shortness of breath.   Cardiovascular:  Negative for chest pain, palpitations and leg swelling.  Gastrointestinal:  Negative for abdominal pain, constipation and heartburn.  Musculoskeletal:  Positive for back pain. Negative for joint pain and myalgias.  Skin: Negative.   Neurological:  Negative for dizziness.  Psychiatric/Behavioral:  The patient is not nervous/anxious.    Physical Exam Constitutional:      General: He is not in acute distress.    Appearance: He is well-developed. He is not diaphoretic.  Neck:     Thyroid: No thyromegaly.  Cardiovascular:     Rate and Rhythm: Normal rate and regular rhythm.     Pulses: Normal pulses.     Heart sounds: Normal heart sounds.  Pulmonary:     Effort: Pulmonary effort is normal. No respiratory distress.     Breath sounds: Normal breath sounds.  Abdominal:     General: Bowel sounds are normal. There  is no distension.     Palpations: Abdomen is soft.     Tenderness: There is no abdominal tenderness.  Musculoskeletal:        General: Normal range of motion.     Cervical back: Neck supple.     Right lower leg: No edema.     Left lower leg: No edema.  Lymphadenopathy:     Cervical: No cervical adenopathy.  Skin:    General: Skin is warm and dry.     Comments:  Left arm: + thrill + bruit   Neurological:     Mental Status: He is alert and oriented to person, place, and time.  Psychiatric:        Mood and Affect: Mood normal.     ASSESSMENT/ PLAN:  TODAY  Failure to thrive in adult End stage renal disease on dialysis due to type 2 diabetes mellitus   Will stop the following medications: cbg's; insulin; coumadin; eliquis; calcitriol; iron calcium acetate.  Will stop dialysis  Will begin roxanol 5 mg every 4 hours as needed Will begin ativan solution 0.5  mg every 6 hours as needed Will setup a hospice consult.  Will focus his care on comfort  Time spent with patient 40 minutes: discussion of goals of care; medications.    Ok Edwards NP [facility]  Contact [phone] Monday through Friday 8am- 5pm  After hours call [phone]

## 2020-12-15 DIAGNOSIS — R627 Adult failure to thrive: Secondary | ICD-10-CM | POA: Insufficient documentation

## 2020-12-18 NOTE — Telephone Encounter (Signed)
Pt deceased as of 01-Jan-2021

## 2020-12-21 ENCOUNTER — Encounter: Payer: PPO | Admitting: General Surgery

## 2020-12-28 ENCOUNTER — Encounter (INDEPENDENT_AMBULATORY_CARE_PROVIDER_SITE_OTHER): Payer: PPO | Admitting: Ophthalmology

## 2021-01-11 DEATH — deceased

## 2021-03-20 ENCOUNTER — Encounter: Payer: Self-pay | Admitting: Family Medicine

## 2021-04-27 ENCOUNTER — Ambulatory Visit: Payer: PPO | Admitting: Cardiovascular Disease

## 2022-08-06 IMAGING — DX DG CHEST 1V PORT
1 series · 1 of 1 positions shown · non-contrast
Comparison: 09/01/2020

CLINICAL DATA: Fall.  Chest and right foot pain.

EXAM:
PORTABLE CHEST 1 VIEW

[chest ap]
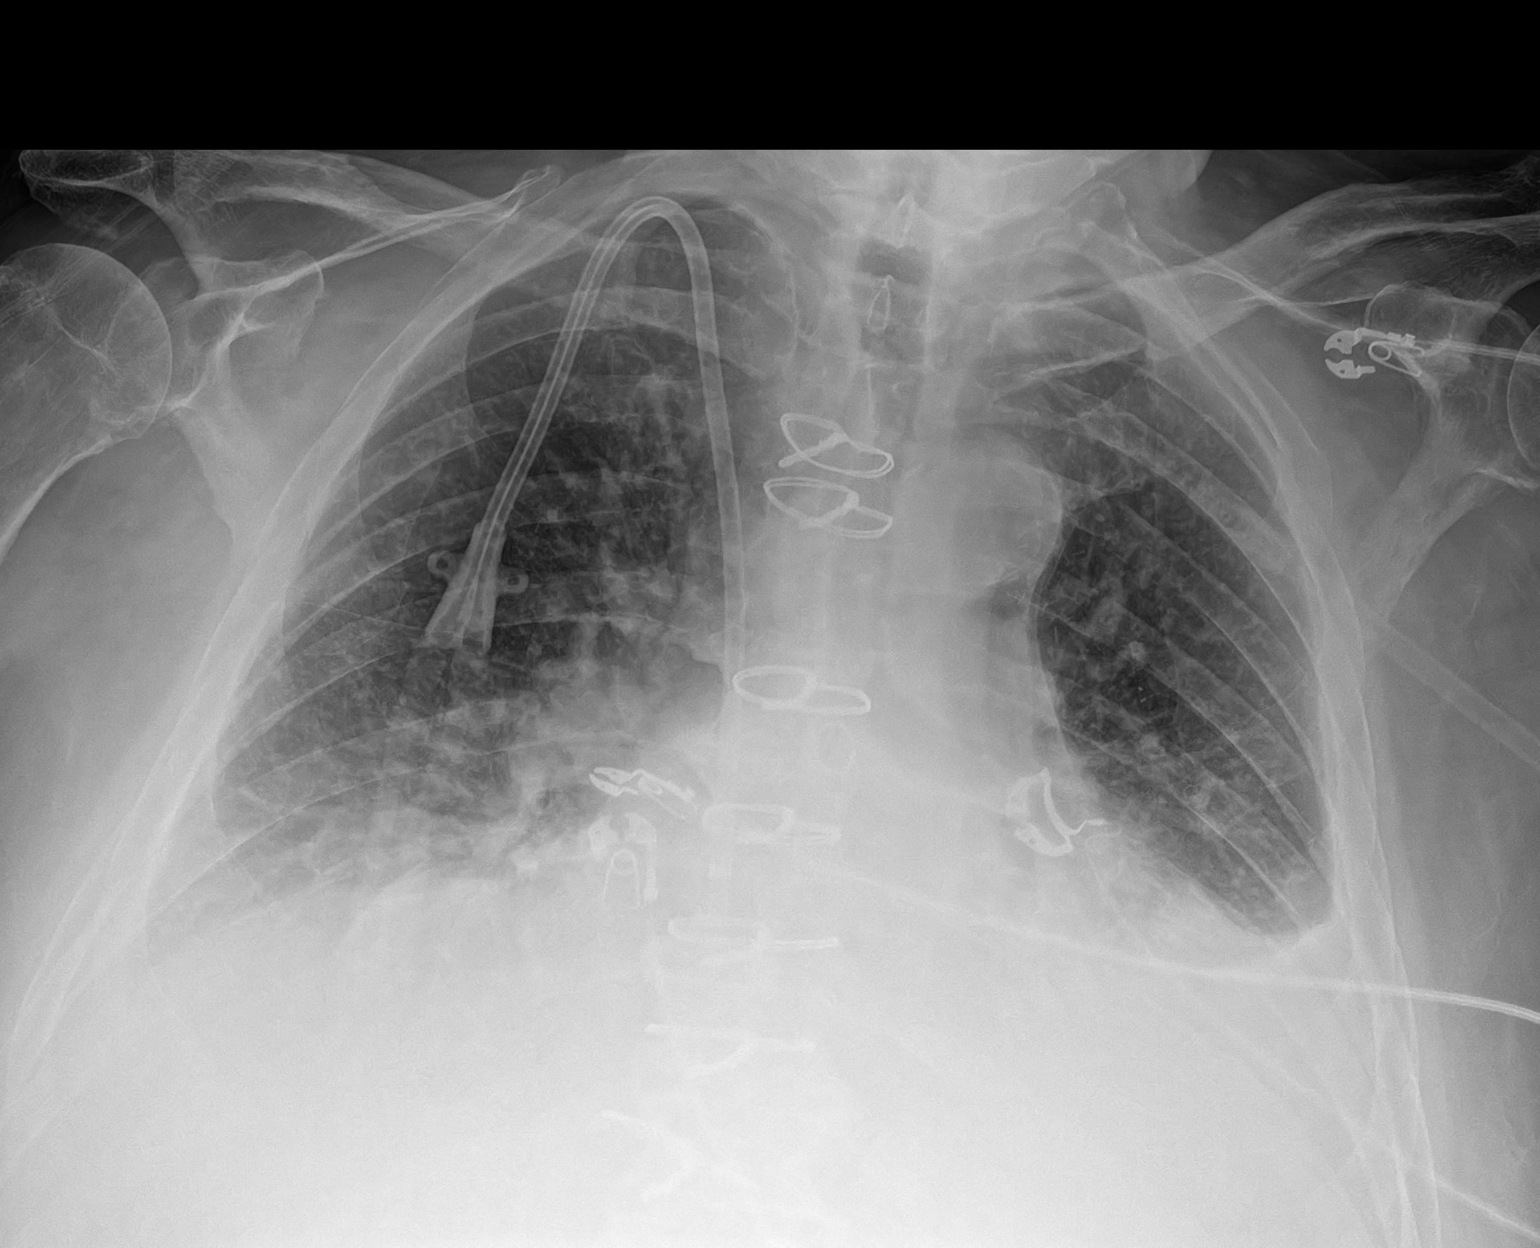

[1 of 1 positions shown; findings below may reference images not displayed]

FINDINGS: Unchanged mild cardiomegaly. Unchanged mild pulmonary vascular
congestion. Small bilateral pleural effusions are not significantly
changed since prior examination. Interval placement of dialysis
catheter. Median sternotomy changes again noted.
IMPRESSION: No acute abnormality of the chest. Unchanged findings of mild
CHF/fluid volume overload.

## 2022-08-06 IMAGING — DX DG FOOT 2V*R*
2 series · 2 of 2 positions shown · non-contrast
Comparison: Radiograph 08/18/2020

CLINICAL DATA: wound to lateral fore foot

EXAM:
RIGHT FOOT - 2 VIEW

[foot ap]
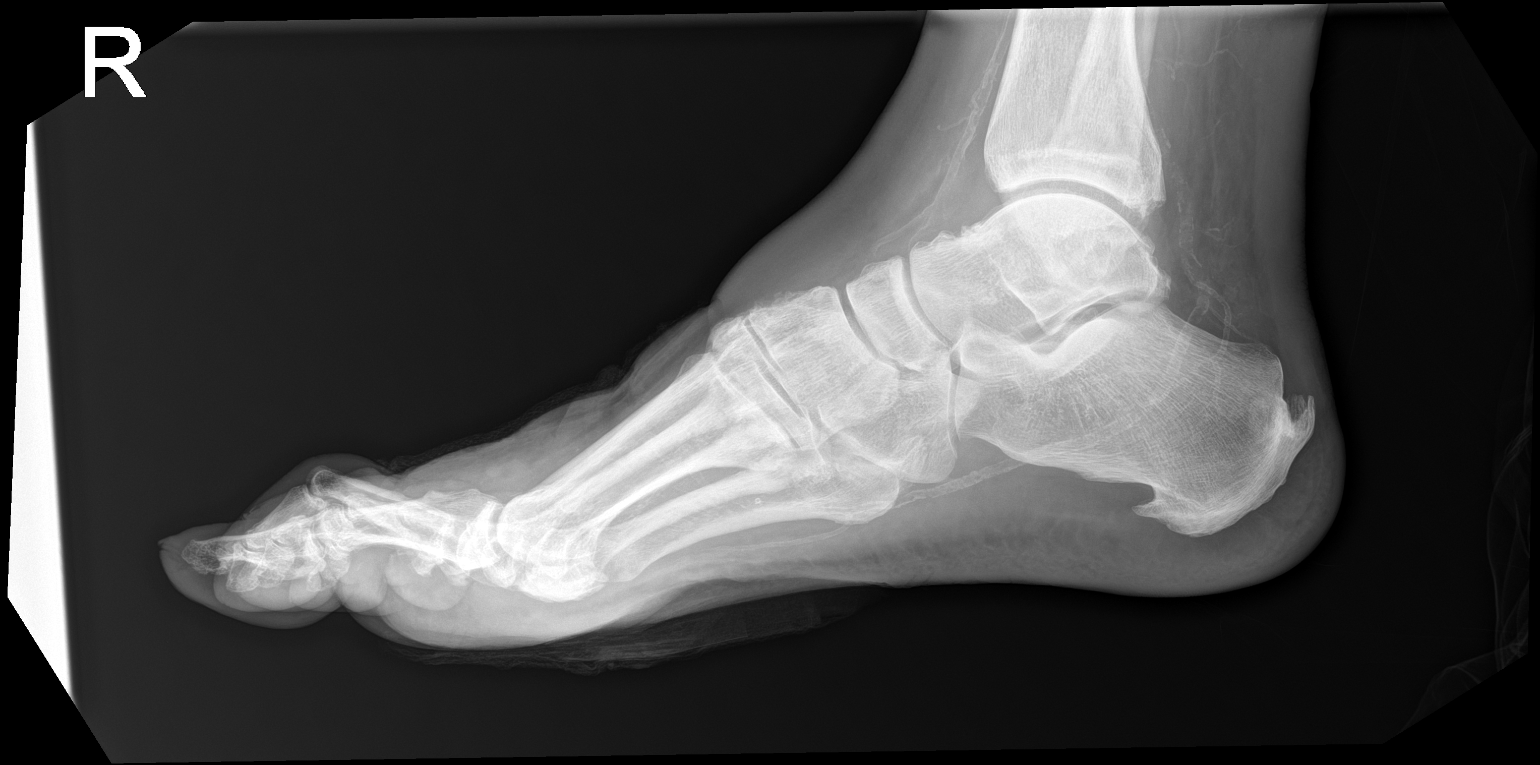

[foot lat]
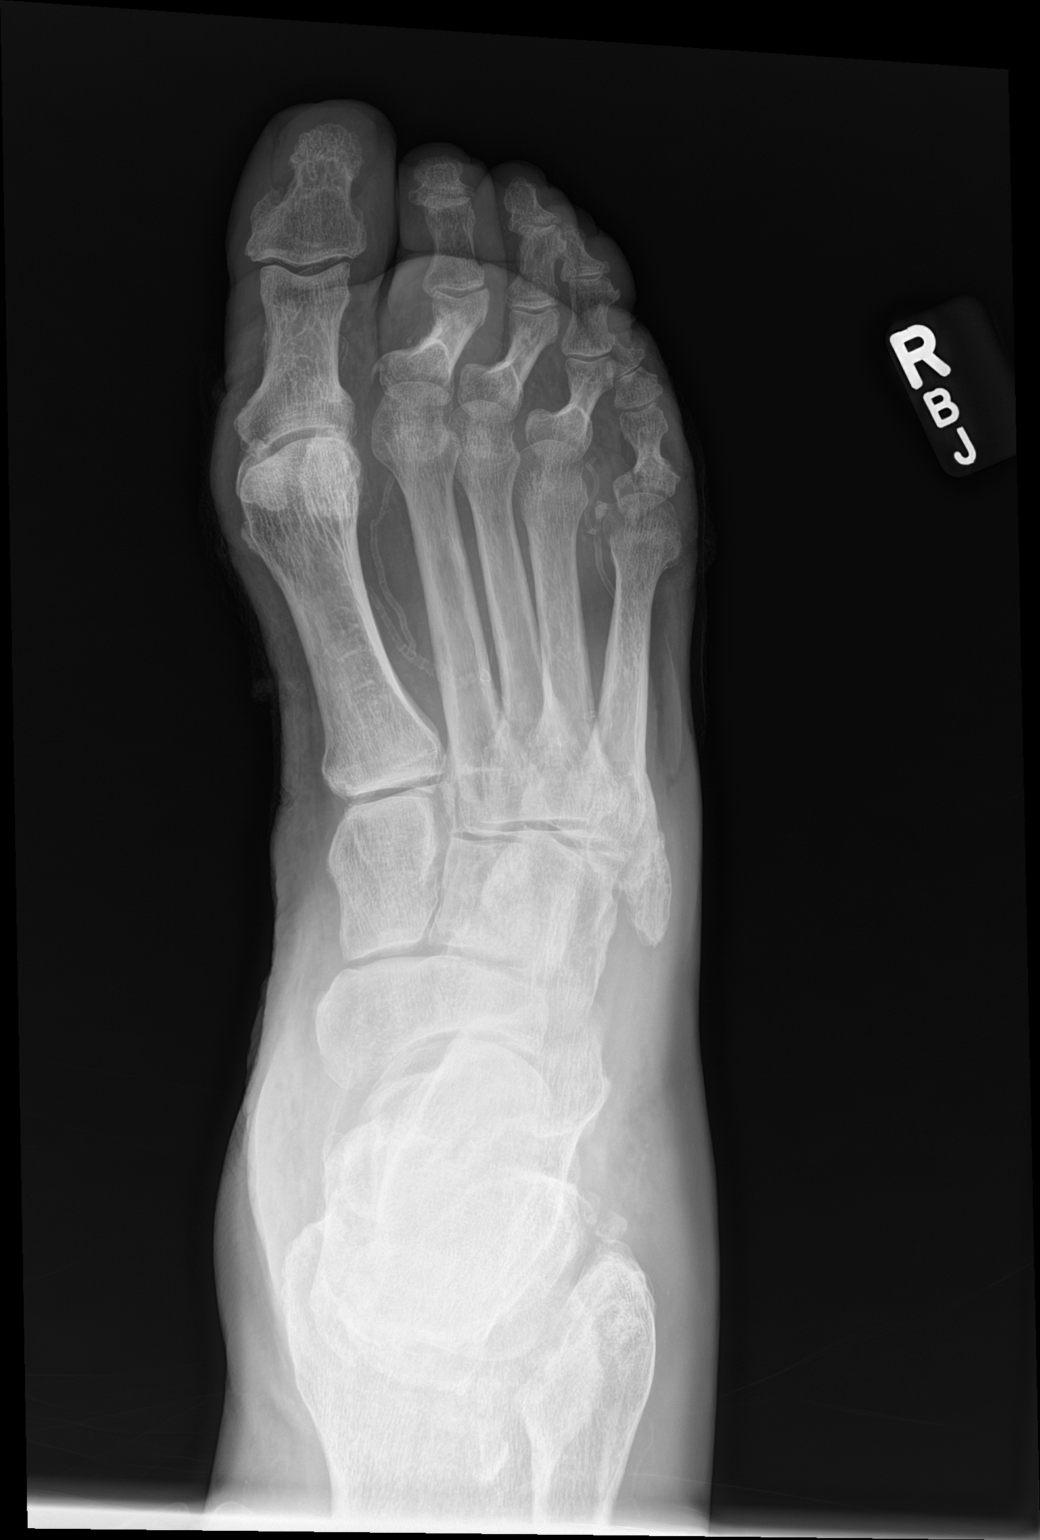

[2 of 2 positions shown; findings below may reference images not displayed]

FINDINGS: There is no evidence of acute fracture or dislocation. Unchanged
bulky ossification along the base of the fifth metatarsal, possibly
from prior trauma. There is a lateral forefoot soft tissue defect.
There is no frank bony destruction radiographically. Mild midfoot
and forefoot degenerative changes. Plantar and dorsal calcaneal
spurring. Vascular calcifications.
IMPRESSION: Lateral forefoot soft tissue defect. No frank bony destruction
radiographically to suggest osteomyelitis. MRI would be more
sensitive.
